# Patient Record
Sex: Male | Born: 1950 | Race: White | Hispanic: No | Smoking: Current every day smoker
Health system: Southern US, Community
[De-identification: ages and names within clinical notes are randomized; demographics above are authoritative.]

## PROBLEM LIST (undated history)

## (undated) DIAGNOSIS — F419 Anxiety disorder, unspecified: Secondary | ICD-10-CM

## (undated) DIAGNOSIS — R229 Localized swelling, mass and lump, unspecified: Secondary | ICD-10-CM

## (undated) DIAGNOSIS — Z8619 Personal history of other infectious and parasitic diseases: Secondary | ICD-10-CM

## (undated) DIAGNOSIS — F1910 Other psychoactive substance abuse, uncomplicated: Secondary | ICD-10-CM

## (undated) DIAGNOSIS — F329 Major depressive disorder, single episode, unspecified: Secondary | ICD-10-CM

## (undated) DIAGNOSIS — K5790 Diverticulosis of intestine, part unspecified, without perforation or abscess without bleeding: Secondary | ICD-10-CM

## (undated) DIAGNOSIS — F1020 Alcohol dependence, uncomplicated: Secondary | ICD-10-CM

## (undated) DIAGNOSIS — M1990 Unspecified osteoarthritis, unspecified site: Secondary | ICD-10-CM

## (undated) DIAGNOSIS — N189 Chronic kidney disease, unspecified: Secondary | ICD-10-CM

## (undated) DIAGNOSIS — C801 Malignant (primary) neoplasm, unspecified: Secondary | ICD-10-CM

## (undated) DIAGNOSIS — F32A Depression, unspecified: Secondary | ICD-10-CM

## (undated) DIAGNOSIS — L578 Other skin changes due to chronic exposure to nonionizing radiation: Secondary | ICD-10-CM

## (undated) DIAGNOSIS — T7840XA Allergy, unspecified, initial encounter: Secondary | ICD-10-CM

## (undated) DIAGNOSIS — F1911 Other psychoactive substance abuse, in remission: Secondary | ICD-10-CM

## (undated) DIAGNOSIS — S2249XA Multiple fractures of ribs, unspecified side, initial encounter for closed fracture: Secondary | ICD-10-CM

## (undated) DIAGNOSIS — F319 Bipolar disorder, unspecified: Secondary | ICD-10-CM

## (undated) DIAGNOSIS — IMO0002 Reserved for concepts with insufficient information to code with codable children: Secondary | ICD-10-CM

## (undated) DIAGNOSIS — D649 Anemia, unspecified: Secondary | ICD-10-CM

## (undated) DIAGNOSIS — I509 Heart failure, unspecified: Secondary | ICD-10-CM

## (undated) DIAGNOSIS — S92309A Fracture of unspecified metatarsal bone(s), unspecified foot, initial encounter for closed fracture: Secondary | ICD-10-CM

## (undated) DIAGNOSIS — F17200 Nicotine dependence, unspecified, uncomplicated: Secondary | ICD-10-CM

## (undated) DIAGNOSIS — E538 Deficiency of other specified B group vitamins: Secondary | ICD-10-CM

## (undated) DIAGNOSIS — M810 Age-related osteoporosis without current pathological fracture: Secondary | ICD-10-CM

## (undated) HISTORY — DX: Deficiency of other specified B group vitamins: E53.8

## (undated) HISTORY — DX: Nicotine dependence, unspecified, uncomplicated: F17.200

## (undated) HISTORY — DX: Chronic kidney disease, unspecified: N18.9

## (undated) HISTORY — DX: Anemia, unspecified: D64.9

## (undated) HISTORY — DX: Depression, unspecified: F32.A

## (undated) HISTORY — DX: Fracture of unspecified metatarsal bone(s), unspecified foot, initial encounter for closed fracture: S92.309A

## (undated) HISTORY — DX: Heart failure, unspecified: I50.9

## (undated) HISTORY — DX: Malignant (primary) neoplasm, unspecified: C80.1

## (undated) HISTORY — DX: Major depressive disorder, single episode, unspecified: F32.9

## (undated) HISTORY — DX: Other psychoactive substance abuse, in remission: F19.11

## (undated) HISTORY — DX: Allergy, unspecified, initial encounter: T78.40XA

## (undated) HISTORY — DX: Alcohol dependence, uncomplicated: F10.20

## (undated) HISTORY — DX: Other skin changes due to chronic exposure to nonionizing radiation: L57.8

## (undated) HISTORY — DX: Other psychoactive substance abuse, uncomplicated: F19.10

## (undated) HISTORY — DX: Personal history of other infectious and parasitic diseases: Z86.19

## (undated) HISTORY — PX: INGUINAL HERNIA REPAIR: SHX194

## (undated) HISTORY — DX: Bipolar disorder, unspecified: F31.9

## (undated) HISTORY — DX: Multiple fractures of ribs, unspecified side, initial encounter for closed fracture: S22.49XA

## (undated) HISTORY — DX: Diverticulosis of intestine, part unspecified, without perforation or abscess without bleeding: K57.90

## (undated) HISTORY — PX: JOINT REPLACEMENT: SHX530

## (undated) HISTORY — DX: Anxiety disorder, unspecified: F41.9

## (undated) HISTORY — DX: Unspecified osteoarthritis, unspecified site: M19.90

## (undated) HISTORY — DX: Age-related osteoporosis without current pathological fracture: M81.0

## (undated) HISTORY — PX: HERNIA REPAIR: SHX51

---
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
## 2007-10-17 ENCOUNTER — Emergency Department (HOSPITAL_COMMUNITY): Admission: EM | Admit: 2007-10-17 | Discharge: 2007-10-17 | Payer: Self-pay | Admitting: Emergency Medicine

## 2010-09-23 ENCOUNTER — Encounter: Payer: Self-pay | Admitting: Family Medicine

## 2010-10-09 DIAGNOSIS — S2249XA Multiple fractures of ribs, unspecified side, initial encounter for closed fracture: Secondary | ICD-10-CM

## 2010-10-09 HISTORY — DX: Multiple fractures of ribs, unspecified side, initial encounter for closed fracture: S22.49XA

## 2011-08-06 ENCOUNTER — Telehealth: Payer: Self-pay | Admitting: Family Medicine

## 2011-08-06 ENCOUNTER — Ambulatory Visit (INDEPENDENT_AMBULATORY_CARE_PROVIDER_SITE_OTHER): Payer: PRIVATE HEALTH INSURANCE | Admitting: Family Medicine

## 2011-08-06 ENCOUNTER — Encounter: Payer: Self-pay | Admitting: Family Medicine

## 2011-08-06 DIAGNOSIS — N529 Male erectile dysfunction, unspecified: Secondary | ICD-10-CM | POA: Insufficient documentation

## 2011-08-06 DIAGNOSIS — Z8659 Personal history of other mental and behavioral disorders: Secondary | ICD-10-CM

## 2011-08-06 DIAGNOSIS — L0390 Cellulitis, unspecified: Secondary | ICD-10-CM

## 2011-08-06 DIAGNOSIS — M1710 Unilateral primary osteoarthritis, unspecified knee: Secondary | ICD-10-CM

## 2011-08-06 DIAGNOSIS — L0291 Cutaneous abscess, unspecified: Secondary | ICD-10-CM

## 2011-08-06 DIAGNOSIS — M1712 Unilateral primary osteoarthritis, left knee: Secondary | ICD-10-CM

## 2011-08-06 DIAGNOSIS — R030 Elevated blood-pressure reading, without diagnosis of hypertension: Secondary | ICD-10-CM | POA: Insufficient documentation

## 2011-08-06 MED ORDER — HYDROCODONE-ACETAMINOPHEN 5-500 MG PO TABS: ORAL_TABLET | ORAL | Status: DC

## 2011-08-06 MED ORDER — TADALAFIL 5 MG PO TABS: 5.0000 mg | ORAL_TABLET | Freq: Every day | ORAL | Status: DC

## 2011-08-06 NOTE — Assessment & Plan Note (Signed)
Trial of cialis--30 day free, 5mg  tabs, 1 qd, no RF. Will obtain old records to see if labs have included a testosterone level.

## 2011-08-06 NOTE — Assessment & Plan Note (Signed)
He has distant hx of traumatic injuries to knee and this could have accelerated his knee cartilage degeneration. Continue ibup 400mg  qd-bid. Vicodin 5/500, 1-2 tabs q6h prn, #40, RF x 1.  Therapeutic expectations and side effect profile of medication discussed today.  Patient's questions answered. Check left knee x-ray and gather old records. Refer to orthopedist for consideration of TKR.

## 2011-08-06 NOTE — Assessment & Plan Note (Signed)
Pt says he is doing "ok" off meds at this time, but I suspect he may just have fears about the meds b/c of past experiences. His outside med rec portion of his meds section of EMR shows he was rx'd bupropion, lamictal, and trazodone in September 2012. We'll discuss this more in depth next visit to make sure he doesn't want trial of alternative med or counseling.

## 2011-08-06 NOTE — Progress Notes (Signed)
Office Note 08/06/2011  CC:  Chief Complaint  Patient presents with  . Establish Care    CPE    HPI:  Shawn [name] is a 60 y.o. White male who is here to establish care. Patient's most recent primary MD: Dr. Christell Constant at [facility]. Old records were not reviewed prior to or during today's visit.  Describes chronic left knee pain and instability x years, by mid-day pain is 5/10 intensity, often feels unstable and like it buckles/gives way. Rarely does it swell up or turn red or get warm to touch.  Worse with wt bearing/walking, stands on hard surface all day. Better with ibuprofen 400mg  qd and sometimes 500mg  tylenol qd.  Says x-rays (most recent was about 2 yrs ago per his report) have documented "no cartilage left".  Describes 1 yr hx of trouble getting stiff erection.  He gets hard enough for penetration but often loses firmness and cannot complete intercourse. ED meds not tried due to cost.  He has been taking 1 Enzyte tab daily and hasn't found it to be much help. Doesn't recall testosterone testing in the past.  Hx of depression, says the meds made him feel "closed in", emotional blunting, so he stopped them.  Says he feels ok from a mood standpoint currently.  Past Medical History  Diagnosis Date  . Depression     says meds made him emotionally blunted.   . Osteoarthritis X years    left knee.  Distant hx (age 22) "dislocated" knee and required surgery, then crush injury to patella required knee cap removal.  . Diverticular disease     "itis" x 2 episodes (last was about 2007)  . Tobacco dependence   . Alcoholism     "Dry" since 2002    Past Surgical History  Procedure Date  . Knee dislocation surgery decades ago  . Patella fracture surgery     Knee cap removed  . Inguinal hernia repair     left    Family History  Problem Relation Age of Onset  . Arthritis Mother   . Arthritis Father   . Cancer Brother     lung cancer.  Died in early 65s.     History   Social History  . Marital Status: Single    Spouse Name: N/A    Number of Children: N/A  . Years of Education: N/A   Occupational History  . Not on file.   Social History Main Topics  . Smoking status: Current Everyday Smoker -- 1.0 packs/day for 30 years    Types: Cigarettes  . Smokeless tobacco: Never Used  . Alcohol Use: No  . Drug Use: No  . Sexually Active: Not on file   Other Topics Concern  . Not on file   Social History Narrative   Married, 1 son and 1 daughter.Lives in [place].Occupation: parts Camera operator.Tobacco 30 pack-yr hx.  No alcohol currently: quit 10 yrs ago, +alcoholic.No drug use.No exercise.    Current meds: MVI qd, Enzyte qd, ibup 400mg  qd, tyl 500mg  qd Outpatient Encounter Prescriptions as of 08/06/2011  Medication Sig Dispense Refill  . acetaminophen (TYLENOL) 500 MG tablet Take 1,000 mg by mouth every 6 (six) hours as needed.        Marland Kitchen HYDROcodone-acetaminophen (VICODIN) 5-500 MG per tablet 1-2 tabs po q6h prn pain  40 tablet  1  . tadalafil (CIALIS) 5 MG tablet Take 1 tablet (5 mg total) by mouth daily.  30 tablet  0  Allergies  Allergen Reactions  . Codeine Nausea Only  . Diclofenac Rash    Diclofenac tabs    ROS Review of Systems  Constitutional: Negative for fever, appetite change and fatigue.  HENT: Negative for congestion and sore throat.   Eyes: Negative for visual disturbance.  Respiratory: Negative for cough, chest tightness and shortness of breath.   Cardiovascular: Negative for chest pain.  Gastrointestinal: Negative for nausea and abdominal pain.  Genitourinary: Negative for dysuria.  Musculoskeletal: Positive for joint swelling (occ left knee). Negative for back pain.  Skin: Negative for rash.  Neurological: Negative for dizziness, tremors, seizures, weakness, light-headedness and headaches.  Hematological: Negative for adenopathy.    PE; Blood pressure 180/97,  pulse 67, height 6' 1.5" (1.867 m), weight 180 lb (81.647 kg), SpO2 96.00%. BP recheck 166/100 in both left and right arm. Gen: Alert, well appearing.  Patient is oriented to person, place, time, and situation. ENT:   Eyes: no injection, icteris, swelling, or exudate.  EOMI, PERRLA. Nose: no drainage or turbinate edema/swelling.  No injection or focal lesion.  Mouth: lips without lesion/swelling.  Oral mucosa pink and moist.  Oropharynx without erythema, exudate, or swelling.  Neck - No masses or thyromegaly or limitation in range of motion CV: RRR, no m/r/g.   LUNGS: CTA bilat, nonlabored resps, good aeration in all lung fields.  Pertinent labs:  none  ASSESSMENT AND PLAN:   New Patient: obtain old records.  Osteoarthritis of left knee He has distant hx of traumatic injuries to knee and this could have accelerated his knee cartilage degeneration. Continue ibup 400mg  qd-bid. Vicodin 5/500, 1-2 tabs q6h prn, #40, RF x 1.  Therapeutic expectations and side effect profile of medication discussed today.  Patient's questions answered. Check left knee x-ray and gather old records. Refer to orthopedist for consideration of TKR.  Erectile dysfunction Trial of cialis--30 day free, 5mg  tabs, 1 qd, no RF. Will obtain old records to see if labs have included a testosterone level.   Elevated blood pressure reading without diagnosis of hypertension No prior hx of this per pt. He'll monitor with his wife's home bp cuff daily and bring these to next f/u visit in 37mo. Call before then if bp consistently >160 systolic or >100 diastolic.  History of depression Pt says he is doing "ok" off meds at this time, but I suspect he may just have fears about the meds b/c of past experiences. His outside med rec portion of his meds section of EMR shows he was rx'd bupropion, lamictal, and trazodone in September 2012. We'll discuss this more in depth next visit to make sure he doesn't want trial of alternative  med or counseling.     Return in about 1 month (around 09/06/2011) for f/u ED, tobacco dependence, depression.

## 2011-08-06 NOTE — Telephone Encounter (Signed)
Pls request records from [facility] (Dr. Christell Constant)  Thx.

## 2011-08-06 NOTE — Assessment & Plan Note (Signed)
No prior hx of this per pt. He'll monitor with his wife's home bp cuff daily and bring these to next f/u visit in 45mo. Call before then if bp consistently >160 systolic or >100 diastolic.

## 2011-08-13 ENCOUNTER — Ambulatory Visit (INDEPENDENT_AMBULATORY_CARE_PROVIDER_SITE_OTHER)
Admission: RE | Admit: 2011-08-13 | Discharge: 2011-08-13 | Disposition: A | Discharge status: Home / Self Care | Payer: PRIVATE HEALTH INSURANCE | Source: Ambulatory Visit | Attending: Family Medicine | Admitting: Family Medicine

## 2011-08-13 DIAGNOSIS — M1712 Unilateral primary osteoarthritis, left knee: Secondary | ICD-10-CM

## 2011-08-13 DIAGNOSIS — M1710 Unilateral primary osteoarthritis, unspecified knee: Secondary | ICD-10-CM

## 2011-09-04 ENCOUNTER — Encounter: Payer: Self-pay | Admitting: *Deleted

## 2011-09-04 ENCOUNTER — Encounter: Payer: Self-pay | Admitting: Family Medicine

## 2011-09-04 ENCOUNTER — Ambulatory Visit (INDEPENDENT_AMBULATORY_CARE_PROVIDER_SITE_OTHER): Payer: PRIVATE HEALTH INSURANCE | Admitting: Family Medicine

## 2011-09-04 VITALS — BP 116/80 | HR 83 | Temp 98.1°F | Ht 73.5 in | Wt 176.0 lb

## 2011-09-04 DIAGNOSIS — K529 Noninfective gastroenteritis and colitis, unspecified: Secondary | ICD-10-CM

## 2011-09-04 DIAGNOSIS — K5289 Other specified noninfective gastroenteritis and colitis: Secondary | ICD-10-CM

## 2011-09-04 DIAGNOSIS — M1712 Unilateral primary osteoarthritis, left knee: Secondary | ICD-10-CM

## 2011-09-04 DIAGNOSIS — IMO0002 Reserved for concepts with insufficient information to code with codable children: Secondary | ICD-10-CM

## 2011-09-04 DIAGNOSIS — E860 Dehydration: Secondary | ICD-10-CM

## 2011-09-04 DIAGNOSIS — R109 Unspecified abdominal pain: Secondary | ICD-10-CM

## 2011-09-04 DIAGNOSIS — M1710 Unilateral primary osteoarthritis, unspecified knee: Secondary | ICD-10-CM

## 2011-09-04 LAB — CBC WITH DIFFERENTIAL/PLATELET
Basophils Relative: 0.3 % (ref 0.0–3.0)
Eosinophils Absolute: 0.3 10*3/uL (ref 0.0–0.7)
Hemoglobin: 15 g/dL (ref 13.0–17.0)
Lymphs Abs: 1 10*3/uL (ref 0.7–4.0)
MCHC: 34.8 g/dL (ref 30.0–36.0)
MCV: 93.6 fl (ref 78.0–100.0)
Monocytes Absolute: 0.8 10*3/uL (ref 0.1–1.0)
Neutro Abs: 5.5 10*3/uL (ref 1.4–7.7)
Neutrophils Relative %: 72.7 % (ref 43.0–77.0)
RBC: 4.62 Mil/uL (ref 4.22–5.81)

## 2011-09-04 LAB — COMPREHENSIVE METABOLIC PANEL
AST: 19 U/L (ref 0–37)
Albumin: 3.6 g/dL (ref 3.5–5.2)
Alkaline Phosphatase: 118 U/L — ABNORMAL HIGH (ref 39–117)
Potassium: 3.6 mEq/L (ref 3.5–5.3)
Sodium: 138 mEq/L (ref 135–145)
Total Protein: 6.3 g/dL (ref 6.0–8.3)

## 2011-09-04 LAB — LIPASE: Lipase: <10 U/L (ref 0–75)

## 2011-09-04 MED ORDER — HYDROCODONE-ACETAMINOPHEN 5-500 MG PO TABS: ORAL_TABLET | ORAL | Status: DC

## 2011-09-04 MED ORDER — PROMETHAZINE HCL 25 MG/ML IJ SOLN
12.5000 mg | Freq: Once | INTRAMUSCULAR | Status: AC
  Administered 2011-09-04: 12.5 mg via INTRAMUSCULAR

## 2011-09-04 MED ORDER — PROMETHAZINE HCL 12.5 MG PO TABS: ORAL_TABLET | ORAL | Status: DC

## 2011-09-04 MED ORDER — PROMETHAZINE HCL 25 MG RE SUPP: RECTAL | Status: DC

## 2011-09-04 NOTE — Progress Notes (Signed)
OFFICE NOTE  09/04/2011  CC:  Chief Complaint  Patient presents with  . URI    flu like symptoms     HPI: Patient is a 61 y.o. Caucasian male who is here for nausea, vomiting, and diarrhea.  Here with wife today, who helped with history. Pt reports onset of symptoms 5 days ago--including prominent full body aches.  Started one night after eating at a local seafood restaurant, ate what he has eaten there many times before, knows of no one else sick that ate there.  T 103, vomiting peristently for about 2d, now this has abated but nausea/no appetite continue.  Fevers have abated.  Had 5-7 watery BMs per 24h for a few days, now letting up but still having loose BMs.  No blood or pus noted in stool.  Epigastric pain severe initially, now just sore, mostly central epigastric area.  Drinking liquids fair---mostly gatorade.  Gets dizzy when he sits up and tries to walk around, wants to just lay in bed.   Denies ST, URI, cough, rash, or HA.  No antibiotics in months.  ROS:  no rash, no neck stiffness, no shortness of breath, no chest pain.  HIs wife states he has lost about 20 lbs during this illness. He got influenza vaccine 2 mo ago.   Pertinent PMH:  Past Medical History  Diagnosis Date  . Depression     says meds made him emotionally blunted.   . Osteoarthritis X years    left knee.  Distant hx (age 58) "dislocated" knee and required surgery, then crush injury to patella required knee cap removal.  . Diverticular disease     "itis" x 2 episodes (last was about 2007)  . Tobacco dependence   . Alcoholism     "Dry" since 2002  Of note, he has been referred to local orthopedist but has not done this yet--says he plans to.  Past Surgical History  Procedure Date  . Knee dislocation surgery decades ago  . Patella fracture surgery     Knee cap removed  . Inguinal hernia repair     left   Past family and social history reviewed and there are no changes since the patient's last office  visit with me.  Pertinent Meds:  OTC cold med, tylenol prn ,cialis prn  PE: Blood pressure 116/80, pulse 83, temperature 98.1 F (36.7 C), temperature source Temporal, height 6' 1.5" (1.867 m), weight 176 lb (79.833 kg), SpO2 96.00%. GEN: appears weak, tired, moves slowly/cautiously.  Appears nontoxic.  He's alert, oriented x 4. ENT: Ears: EACs clear, normal epithelium.  TMs with good light reflex and landmarks bilaterally.  Eyes: no injection, icteris, swelling, or exudate.  EOMI, PERRLA. Nose: no drainage or turbinate edema/swelling.  No injection or focal lesion.  Mouth: lips without lesion/swelling.  Oral mucosa pink and moist.  Dentition intact and without obvious caries or gingival swelling.  Oropharynx without erythema, exudate, or swelling.  Neck - No masses or thyromegaly or limitation in range of motion CV: RRR, no m/r/g.   LUNGS: CTA bilat, nonlabored resps, good aeration in all lung fields. ABD: soft, mild mid epigastric TTP, without guarding or rebound.  No mass or HSM.  BS hypoactive. EXT: no clubbing, cyanosis, or edema.  Skin - no sores or suspicious lesions or rashes or color changes   IMPRESSION AND PLAN: Severe gastroenteritis, dehydration, ongoing sx's--although mildly improving gradually.  Likely viral etiology. Plan is to give 12.5mg  IM phenergan in office today, sent rx's  for phenergan pills and suppositories to his pharmacy, encouraged slow/persistent clear liquid intake, slow advancement of diet.  Will also check CBC, CMET, Lipase, H. Pylori ab, and routine stool studies. Vicodin 5/500, 1-2 tabs po q6h prn hopefully to help with aches and with diearrhea a bit.  FOLLOW UP: 2d

## 2011-09-05 ENCOUNTER — Ambulatory Visit: Payer: PRIVATE HEALTH INSURANCE | Admitting: Family Medicine

## 2011-09-06 ENCOUNTER — Ambulatory Visit (INDEPENDENT_AMBULATORY_CARE_PROVIDER_SITE_OTHER): Payer: PRIVATE HEALTH INSURANCE | Admitting: Family Medicine

## 2011-09-06 ENCOUNTER — Encounter: Payer: Self-pay | Admitting: Family Medicine

## 2011-09-06 DIAGNOSIS — M1712 Unilateral primary osteoarthritis, left knee: Secondary | ICD-10-CM

## 2011-09-06 DIAGNOSIS — F17200 Nicotine dependence, unspecified, uncomplicated: Secondary | ICD-10-CM

## 2011-09-06 DIAGNOSIS — K529 Noninfective gastroenteritis and colitis, unspecified: Secondary | ICD-10-CM

## 2011-09-06 DIAGNOSIS — M1710 Unilateral primary osteoarthritis, unspecified knee: Secondary | ICD-10-CM

## 2011-09-06 DIAGNOSIS — R030 Elevated blood-pressure reading, without diagnosis of hypertension: Secondary | ICD-10-CM

## 2011-09-06 DIAGNOSIS — K5289 Other specified noninfective gastroenteritis and colitis: Secondary | ICD-10-CM

## 2011-09-06 MED ORDER — BUPROPION HCL ER (SR) 150 MG PO TB12: 150.0000 mg | ORAL_TABLET | Freq: Two times a day (BID) | ORAL | Status: DC

## 2011-09-06 NOTE — Progress Notes (Signed)
OFFICE NOTE  09/08/2011  CC:  Chief Complaint  Patient presents with  . Follow-up    tobacco, depression, ED     HPI:   Patient is a 61 y.o. Caucasian male who is here for f/u tob dep, hx of depression, ED, osteoarthritis, plus had severe GE 2d/a. Feels 75-80% improved regarding GE sx's.  Advancing diet a litte, taking fluids well.  Not requiring phenergan today. Still with 3-4 loose BM's per day.  We reviewed results of stools studies avail so far: giardia, crypto, rotavirus all negative.  Discussed tobacco dependence: has smoked 1 pack/day of full flavored filtered cig's for 30+ yrs, is at a point where he wants to try to quit and is interested in med assistance.  He's familiar with nicotine replacement from prior attempts to quit. He is not in favor of chantix.  He has a cigarette less than 10 min after waking up in the morning.  If he wakes up in the night he will often have one.  Left knee still painful, recent x-ray confirmed severe DJD.  He took the vicodin I gave him but it took him a while and he did not request refill.  I gave him some the other day when he was here for GE illness.  He has been referred to orthopedist for further e/m but due to scheduling conflicts he has had to put this off some, plans on calling them back soon.  Pertinent PMH:  Past Medical History  Diagnosis Date  . Depression     says meds made him emotionally blunted.   . Osteoarthritis X years    left knee.  Distant hx (age 87) "dislocated" knee and required surgery, then crush injury to patella required knee cap removal.  . Diverticular disease     "itis" x 2 episodes (last was about 2007)  . Tobacco dependence   . Alcoholism     "Dry" since 2002   Past Surgical History  Procedure Date  . Knee dislocation surgery decades ago  . Patella fracture surgery     Knee cap removed  . Inguinal hernia repair     left   Past family and social history reviewed and there are no changes since the  patient's last office visit with me.  MEDS;   Outpatient Prescriptions Prior to Visit  Medication Sig Dispense Refill  . acetaminophen (TYLENOL) 500 MG tablet Take 1,000 mg by mouth every 6 (six) hours as needed.        Marland Kitchen HYDROcodone-acetaminophen (VICODIN) 5-500 MG per tablet 1-2 tabs po q6h prn pain  40 tablet  1  . promethazine (PHENERGAN) 12.5 MG tablet 1-2 tabs po q6h prn nausea  30 tablet  0  . promethazine (PHENERGAN) 25 MG suppository 1/2-1 suppository PR q6h prn nausea if unable to tolerate phenergan tabs  12 each  1  . tadalafil (CIALIS) 5 MG tablet Take 1 tablet (5 mg total) by mouth daily.  30 tablet  0    PE: Blood pressure 121/77, pulse 88, height 6' 1.5" (1.867 m), weight 173 lb (78.472 kg). Gen: Alert, well appearing.  Patient is oriented to person, place, time, and situation. ENT: Eyes: no injection, icteris, swelling, or exudate.  EOMI, PERRLA. Nose: no drainage or turbinate edema/swelling.  No injection or focal lesion.  Mouth: lips without lesion/swelling.  Oral mucosa pink and moist.  Oropharynx without erythema, exudate, or swelling.  Neck - No masses or thyromegaly or limitation in range of motion CV: RRR, no  m/r/g.   LUNGS: CTA bilat, nonlabored resps, good aeration in all lung fields. ABD: soft, NT, ND, BS normal.  No hepatospenomegaly or mass.  No bruits. EXT: no clubbing, cyanosis, or edema.   Lab Results  Component Value Date   WBC 7.5 09/04/2011   HGB 15.0 09/04/2011   HCT 43.2 09/04/2011   MCV 93.6 09/04/2011   PLT 315.0 09/04/2011     Chemistry      Component Value Date/Time   NA 138 09/04/2011 1130   K 3.6 09/04/2011 1130   CL 100 09/04/2011 1130   CO2 25 09/04/2011 1130   BUN 10 09/04/2011 1130   CREATININE 0.76 09/04/2011 1130      Component Value Date/Time   CALCIUM 9.0 09/04/2011 1130   ALKPHOS 118* 09/04/2011 1130   AST 19 09/04/2011 1130   ALT 23 09/04/2011 1130   BILITOT 0.7 09/04/2011 1130     H. Pylori IgG negative 09/04/11  IMPRESSION AND PLAN:  Elevated  blood pressure reading without diagnosis of hypertension Resolved.  Continue to monitor at home periodically.  Tobacco dependence Ready to attempt cessation. He picked his b-day, feb 5, as his quit date. Discussed medication assistance options in detail and decided on bupropion SR 150mg , 1 bid.  Also, he'll do high dose nicotine patch qd x 44mo and then step down to medium dose.  Therapeutic expectations and side effect profile of medication discussed today.  Patient's questions answered.   Gastroenteritis Resolving. Stool studies neg so far (bact clx and c dif pcr still pending), blood labs normal.  Osteoarthritis of left knee Severe.  He has pain meds but rarely uses it because he's stoic, not because he doesn't need it.   He has to rearrange his initial consult visit with orthopedist that we made for him.     FOLLOW UP:  Return for 1-2 mo f/u for fasting CPE and smoking cessation f/u. Will get lipids, TSH, and testosterone level, and PSA at that time.

## 2011-09-08 DIAGNOSIS — K529 Noninfective gastroenteritis and colitis, unspecified: Secondary | ICD-10-CM | POA: Insufficient documentation

## 2011-09-08 DIAGNOSIS — F17200 Nicotine dependence, unspecified, uncomplicated: Secondary | ICD-10-CM | POA: Insufficient documentation

## 2011-09-08 NOTE — Assessment & Plan Note (Signed)
Resolved.  Continue to monitor at home periodically.

## 2011-09-08 NOTE — Assessment & Plan Note (Signed)
Ready to attempt cessation. He picked his b-day, feb 5, as his quit date. Discussed medication assistance options in detail and decided on bupropion SR 150mg , 1 bid.  Also, he'll do high dose nicotine patch qd x 67mo and then step down to medium dose.  Therapeutic expectations and side effect profile of medication discussed today.  Patient's questions answered.

## 2011-09-08 NOTE — Assessment & Plan Note (Signed)
Severe.  He has pain meds but rarely uses it because he's stoic, not because he doesn't need it.   He has to rearrange his initial consult visit with orthopedist that we made for him.

## 2011-09-08 NOTE — Assessment & Plan Note (Signed)
Resolving. Stool studies neg so far (bact clx and c dif pcr still pending), blood labs normal.

## 2011-09-09 LAB — CLOSTRIDIUM DIFFICILE BY PCR: Toxigenic C. Difficile by PCR: NOT DETECTED

## 2011-09-09 LAB — STOOL CULTURE

## 2011-09-16 ENCOUNTER — Telehealth: Payer: Self-pay | Admitting: Family Medicine

## 2011-09-16 NOTE — Telephone Encounter (Signed)
Pt states knee went out on Friday.  He has been alternating ice/heat over the weekend.  States his knee is swollen, red and hot to touch.  Swelling is not as severe as it has been since he is using ice.  Pt is taking 2 vicodin every 6 hours and diclofenac-one tab twice daily with food.  He states these meds are not touching his pain.  He has appt tomorrow morning at 10:15 with ortho.  Please advise recommendation and if you are OK with patient having stronger med.

## 2011-09-16 NOTE — Telephone Encounter (Signed)
Cannot give any stronger medications over the phone, may apply Aspercreme and ice as much as needed overnight

## 2011-09-16 NOTE — Telephone Encounter (Signed)
Pt.notified

## 2011-09-18 ENCOUNTER — Telehealth: Payer: Self-pay | Admitting: Family Medicine

## 2011-09-18 NOTE — Telephone Encounter (Signed)
Patient would like refill

## 2011-09-19 ENCOUNTER — Encounter: Payer: Self-pay | Admitting: Family Medicine

## 2011-09-19 DIAGNOSIS — Z125 Encounter for screening for malignant neoplasm of prostate: Secondary | ICD-10-CM | POA: Insufficient documentation

## 2011-09-19 MED ORDER — TADALAFIL 5 MG PO TABS: 5.0000 mg | ORAL_TABLET | Freq: Every day | ORAL | Status: DC

## 2011-09-19 NOTE — Telephone Encounter (Signed)
Pt will need follow up in 1-2 months.  RX sent to last until then.

## 2011-10-11 ENCOUNTER — Telehealth: Payer: Self-pay | Admitting: *Deleted

## 2011-10-11 NOTE — Telephone Encounter (Signed)
Noted-PM 

## 2011-10-11 NOTE — Telephone Encounter (Signed)
Pt wanted to let you know that he will be having a left total knee replacement on 11/20/11 with Dr. Eulah Pont.

## 2011-10-18 ENCOUNTER — Encounter: Payer: Self-pay | Admitting: Family Medicine

## 2011-10-18 ENCOUNTER — Ambulatory Visit (INDEPENDENT_AMBULATORY_CARE_PROVIDER_SITE_OTHER): Payer: PRIVATE HEALTH INSURANCE | Admitting: Family Medicine

## 2011-10-18 DIAGNOSIS — M1710 Unilateral primary osteoarthritis, unspecified knee: Secondary | ICD-10-CM

## 2011-10-18 DIAGNOSIS — R0902 Hypoxemia: Secondary | ICD-10-CM

## 2011-10-18 DIAGNOSIS — M1712 Unilateral primary osteoarthritis, left knee: Secondary | ICD-10-CM

## 2011-10-18 DIAGNOSIS — Z0000 Encounter for general adult medical examination without abnormal findings: Secondary | ICD-10-CM

## 2011-10-18 LAB — LIPID PANEL
Cholesterol: 170 mg/dL (ref 0–200)
LDL Cholesterol: 100 mg/dL — ABNORMAL HIGH (ref 0–99)
Triglycerides: 67 mg/dL (ref 0.0–149.0)

## 2011-10-18 LAB — TESTOSTERONE: Testosterone: 465.6 ng/dL (ref 350.00–890.00)

## 2011-10-18 NOTE — Progress Notes (Signed)
Office Note 10/24/2011  CC:  Chief Complaint  Patient presents with  . Annual Exam    CPE, discuss smoking cessation, pt did not start Wellbutrin    HPI:  Shawn [name] is a 61 y.o. White male who is here for CPE. I last saw him about 1 mo ago and I rx'd bupropion to assist in tobacco cessation but he did not start this med and did not attempt smoking cessation.  He is scheduled for left knee surgery 11/20/11 and he plans to attempt quitting before that surgery.  Diclofenac has helped his knee pain/stiffness. He completely denies feeling any SOB, dyspnea with exertion, cough, chest pain, chest tightness or any type of resp sx's at baseline and currently.  Past Medical History  Diagnosis Date  . Bipolar disorder     Has had multiple psych hospitalization in the past, most recently at [facility] 01/31/11-02/04/11.  Says meds made him emotionally blunted. (lamictal and wellbutrin).  . Osteoarthritis X years    left knee.  Distant hx (age 49) "dislocated" knee and required surgery, then crush injury to patella required knee cap removal.  . Diverticular disease     "itis" x 2 episodes (last was about 2007)  . Tobacco dependence   . Alcoholism     "Dry" since 2002  . Multiple rib fractures 10/09/10    s/p fall down a ravine--9th and 10th on right, contusion of left 7th and 8th ribs  . Fracture of metatarsal bone(s), closed     3rd, 4th, 5th mid shaft on left foot  . History of substance abuse     cocaine, marijuana, acid, alcohol (in remission since 2010)  . Solar dermatitis     face and ears    Past Surgical History  Procedure Date  . Knee dislocation surgery 1966  . Patellectomy 1968    s/p crush injury  . Inguinal hernia repair     left    Family History  Problem Relation Age of Onset  . Arthritis Mother   . Arthritis Father   . Cancer Brother     lung cancer.  Died in early 43s.    History   Social History  . Marital Status: Married    Spouse Name:  N/A    Number of Children: N/A  . Years of Education: N/A   Occupational History  . Not on file.   Social History Main Topics  . Smoking status: Current Everyday Smoker -- 1.0 packs/day for 30 years    Types: Cigarettes  . Smokeless tobacco: Never Used  . Alcohol Use: No  . Drug Use: No  . Sexually Active: Not on file   Other Topics Concern  . Not on file   Social History Narrative   Married, 1 son and 1 daughter.Lives in [place].Occupation: parts Camera operator.Tobacco 30 pack-yr hx.  No alcohol currently: quit 10 yrs ago, +alcoholic.No drug use.No exercise.     Outpatient Prescriptions Prior to Visit  Medication Sig Dispense Refill  . HYDROcodone-acetaminophen (VICODIN) 5-500 MG per tablet 1-2 tabs po q6h prn pain  40 tablet  1  . tadalafil (CIALIS) 5 MG tablet Take 1 tablet (5 mg total) by mouth daily.  30 tablet  1  . acetaminophen (TYLENOL) 500 MG tablet Take 1,000 mg by mouth every 6 (six) hours as needed.        Marland Kitchen buPROPion (WELLBUTRIN SR) 150 MG 12 hr tablet Take 1 tablet (150 mg total) by mouth 2 (  two) times daily.  60 tablet  3  . promethazine (PHENERGAN) 12.5 MG tablet 1-2 tabs po q6h prn nausea  30 tablet  0  . promethazine (PHENERGAN) 25 MG suppository 1/2-1 suppository PR q6h prn nausea if unable to tolerate phenergan tabs  12 each  1    Allergies  Allergen Reactions  . Codeine Nausea Only  . Diclofenac Rash    Diclofenac tabs    ROS Review of Systems  Constitutional: Negative for fever, chills, appetite change and fatigue.  HENT: Negative for ear pain, congestion, sore throat, neck stiffness and dental problem.   Eyes: Negative for discharge, redness and visual disturbance.  Respiratory: Negative for cough, chest tightness, shortness of breath and wheezing.   Cardiovascular: Negative for chest pain, palpitations and leg swelling.  Gastrointestinal: Negative for nausea, vomiting, abdominal pain, diarrhea and blood  in stool.  Genitourinary: Negative for dysuria, urgency, frequency, hematuria, flank pain and difficulty urinating.  Musculoskeletal: Positive for joint swelling (left knee) and arthralgias (left knee). Negative for myalgias and back pain.  Skin: Negative for pallor and rash.  Neurological: Negative for dizziness, speech difficulty, weakness and headaches.  Hematological: Negative for adenopathy. Does not bruise/bleed easily.  Psychiatric/Behavioral: Negative for confusion and sleep disturbance. The patient is not nervous/anxious.     PE; Blood pressure 141/85, pulse 69, temperature 98.1 F (36.7 C), temperature source Temporal, height 6' 1.5" (1.867 m), weight 178 lb (80.74 kg), SpO2 89.00%. Gen: Alert, well appearing.  Patient is oriented to person, place, time, and situation. ENT: Ears: EACs clear, normal epithelium.  TMs with good light reflex and landmarks bilaterally.  Eyes: no injection, icteris, swelling, or exudate.  EOMI, PERRLA. Nose: no drainage or turbinate edema/swelling.  No injection or focal lesion.  Mouth: lips without lesion/swelling.  Oral mucosa pink and moist.  Dentition intact and without obvious caries or gingival swelling.  Oropharynx without erythema, exudate, or swelling.  Neck - No masses or thyromegaly or limitation in range of motion.  Carotids 2+, no bruits. CV: RRR, no m/r/g.   LUNGS: CTA bilat, nonlabored resps, good aeration in all lung fields. ABD: soft, NT, ND, BS normal.  No hepatospenomegaly or mass.  No bruits. EXT: no clubbing, cyanosis, or edema.  Genitals normal; both testes normal without tenderness, masses, hydroceles, varicoceles, erythema or swelling. Shaft normal, circumcised, meatus normal without discharge. No inguinal hernia noted. No inguinal lymphadenopathy. Rectal exam: negative without mass, lesions or tenderness.  Prostate symmetric, smooth, without nodule or tenderness.  Pertinent labs:  None today  ASSESSMENT AND PLAN:   Health  maintenance examination Reviewed age and gender appropriate health maintenance issues (prudent diet, regular exercise, health risks of tobacco and excessive alcohol, use of seatbelts, fire alarms in home, use of sunscreen).  Also reviewed age and gender appropriate health screening as well as vaccine recommendations. Fasting labs ordered for later today (including PSA).  Hypoxia Initial measurement was with our pulse oximeter on our automatic bp cuff in the triage area.   Repeat pulse ox with portable machine at rest and WHILE AMBULATING 3 laps around our office area showed O2 sat 97% on RA.  Additionally, past pulse ox measurements here on 08/06/11 and 09/04/11 were 96%. I believe our initial measurement was a mistake/inaccurate.  Osteoarthritis of left knee He is scheduled for left TKR soon.     FOLLOW UP:  Return for lab appt today (orders entered). F/u o/v 95mo.

## 2011-10-24 DIAGNOSIS — Z0000 Encounter for general adult medical examination without abnormal findings: Secondary | ICD-10-CM | POA: Insufficient documentation

## 2011-10-24 DIAGNOSIS — R0902 Hypoxemia: Secondary | ICD-10-CM | POA: Insufficient documentation

## 2011-10-24 NOTE — Assessment & Plan Note (Addendum)
Initial measurement was with our pulse oximeter on our automatic bp cuff in the triage area.   Repeat pulse ox with portable machine at rest and WHILE AMBULATING 3 laps around our office area showed O2 sat 97% on RA.  Additionally, past pulse ox measurements here on 08/06/11 and 09/04/11 were 96%. I believe our initial measurement was a mistake/inaccurate.

## 2011-10-24 NOTE — Assessment & Plan Note (Signed)
He is scheduled for left TKR soon.

## 2011-10-24 NOTE — Assessment & Plan Note (Signed)
Reviewed age and gender appropriate health maintenance issues (prudent diet, regular exercise, health risks of tobacco and excessive alcohol, use of seatbelts, fire alarms in home, use of sunscreen).  Also reviewed age and gender appropriate health screening as well as vaccine recommendations. Fasting labs ordered for later today (including PSA).

## 2011-11-06 ENCOUNTER — Encounter (HOSPITAL_COMMUNITY): Payer: Self-pay | Admitting: Pharmacy Technician

## 2011-11-13 ENCOUNTER — Encounter (HOSPITAL_COMMUNITY): Payer: Self-pay

## 2011-11-13 ENCOUNTER — Other Ambulatory Visit: Payer: Self-pay

## 2011-11-13 ENCOUNTER — Encounter (HOSPITAL_COMMUNITY)
Admission: RE | Admit: 2011-11-13 | Discharge: 2011-11-13 | Disposition: A | Discharge status: Home / Self Care | Payer: PRIVATE HEALTH INSURANCE | Source: Ambulatory Visit | Attending: Orthopedic Surgery | Admitting: Orthopedic Surgery

## 2011-11-13 LAB — SURGICAL PCR SCREEN: Staphylococcus aureus: POSITIVE — AB

## 2011-11-13 LAB — URINALYSIS, ROUTINE W REFLEX MICROSCOPIC
Bilirubin Urine: NEGATIVE
Hgb urine dipstick: NEGATIVE
Protein, ur: NEGATIVE mg/dL
Specific Gravity, Urine: 1.013 (ref 1.005–1.030)
Urobilinogen, UA: 0.2 mg/dL (ref 0.0–1.0)

## 2011-11-13 LAB — COMPREHENSIVE METABOLIC PANEL
ALT: 18 U/L (ref 0–53)
AST: 19 U/L (ref 0–37)
CO2: 26 mEq/L (ref 19–32)
Chloride: 104 mEq/L (ref 96–112)
GFR calc non Af Amer: >90 mL/min (ref >90)
Sodium: 139 mEq/L (ref 135–145)
Total Bilirubin: 0.4 mg/dL (ref 0.3–1.2)

## 2011-11-13 LAB — CBC
HCT: 43.3 % (ref 39.0–52.0)
MCV: 89.8 fL (ref 78.0–100.0)
RBC: 4.82 MIL/uL (ref 4.22–5.81)
WBC: 5.8 10*3/uL (ref 4.0–10.5)

## 2011-11-13 LAB — APTT: aPTT: 28 seconds (ref 24–37)

## 2011-11-13 MED ORDER — CHLORHEXIDINE GLUCONATE 4 % EX LIQD: 60.0000 mL | Freq: Once | CUTANEOUS | Status: DC

## 2011-11-13 NOTE — Pre-Procedure Instructions (Signed)
20 Shawn [name]  11/13/2011   Your procedure is scheduled on:  *Wednesday, March 20**  Report to [facility] at 0930 AM.  Call this number if you have problems the morning of surgery: [phone]   Remember:   Do not eat food:After Midnight.  May have clear liquids: up to 4 Hours before arrival.(0530 am)  Clear liquids include soda, tea, black coffee, apple or grape juice, broth.  Take these medicines the morning of surgery with A SIP OF WATER: Wellbutrin, Hydrocodone ( if needed)   Do not wear jewelry, make-up or nail polish.  Do not wear lotions, powders, or perfumes. You may wear deodorant.  Do not shave 48 hours prior to surgery.  Do not bring valuables to the hospital.  Contacts, dentures or bridgework may not be worn into surgery.  Leave suitcase in the car. After surgery it may be brought to your room.  For patients admitted to the hospital, checkout time is 11:00 AM the day of discharge.   Patients discharged the day of surgery will not be allowed to drive home.     Special Instructions: Incentive Spirometry - Practice and bring it with you on the day of surgery. and CHG Shower Use Special Wash: 1/2 bottle night before surgery and 1/2 bottle morning of surgery.   Please read over the following fact sheets that you were given: Pain Booklet, Coughing and Deep Breathing, Blood Transfusion Information, Total Joint Packet, MRSA Information and Surgical Site Infection Prevention

## 2011-11-13 NOTE — Progress Notes (Signed)
NO ORDERS RECEIVED AT THIS TIME.

## 2011-11-13 NOTE — Progress Notes (Signed)
LEFT MESSAGE ON HOME PHONE [phone]  .NASAL CX POS FOR SA.

## 2011-11-14 NOTE — Consult Note (Signed)
Anesthesia:  Patient is a 61 year old male scheduled for a left total knee on 11/20/11.  History includes Bipolar disorder, alcoholism with a temporary relapse in June of 2012 at [facility], tobacco dependence, polysubstance abuse in remission since 2010, OA, multiple rib fractures s/p fall 10/2010, diverticular disease, solar dermatitis.  His PCP is Dr. Milinda Cave who is aware of planned procedure.    Labs acceptable.  CXR showed no acute findings.  EKG shows SB at 55 @ bpm, right BBB.  Currently there are no comparison EKGs available.  No CV symptoms reported at his PAT appointment and no specific pre-operative testing recommended at his last PCP visit.  If remains asymptomatic, then anticipate he can proceed.

## 2011-11-19 MED ORDER — CEFAZOLIN SODIUM-DEXTROSE 2-3 GM-% IV SOLR
2.0000 g | INTRAVENOUS | Status: AC
  Administered 2011-11-20: 2 g via INTRAVENOUS
  Filled 2011-11-19: qty 50

## 2011-11-19 NOTE — H&P (Signed)
  [facility] [address]   [address] [place], [place] [address] [phone] A Division of [facility]  Loreta Ave, M.D.     Robert A. Thurston Hole, M.D.     Lunette Stands, M.D. Eulas Post, M.D.    Buford Dresser, M.D. Estell Harpin, M.D. Ralene Cork, D.O.          Genene Churn. Barry Dienes, PA-C            Kirstin A. Shepperson, PA-C Janace Litten, OPA-C   RE: [place], [place]   [id number]      DOB: Apr 20, 1951 PROGRESS NOTE: 11-05-11 Chief complaint: left knee pain. History of present illness: 61 year old white male history of end stage degenerative joint disease left knee and chronic pain returns. states knee symptoms unchanged from previous visit. He's wanting to proceed with total knee replacement as scheduled. Current medications: Diclofenac naproxen sodium and Hydrocodone. Drug allergies codeine causes GI upset.  Past medical/surgical history: left knee surgery x3 with patellectomy 6/92.  Family history positive heart disease cancer. Social history patient is married admits smoking one pack per day x30 years. Denies alcohol consumption. Review of systems: no fever chills cardiac pulmonary GI GU issues.    EXAMINATION: Height 6' " weight 179 pounds temp 97.6 pulse 63 blood pressure 168/98 right arm and left arm 147/91. Alert and oriented x3 in no acute distress. Head is normal cephalic atraumatic. PERRLA and EOMI. Neck unremarkable. Lungs CTA bilaterally. No wheezes noted. Heart regular rate and rhythm S1 S2.  No murmurs. Abdomen round non-distended. NABS x4. Soft non-tender. Left knee decreased range of motion. 2+ effusion. Joint line tender. Calf non-tender neurovascularly intact. Skin warm and dry. No increase in respiratory effort.   IMPRESSION: End stage degenerative joint disease left knee and chronic pain. Failed conservative treatment. Status post patellectomy 6/92.  PLAN: Will proceed with left total knee  replacement as scheduled. Discussed risks benefits and possible complications. All questions answered. Discussed smoking cessation and advise patient that he should not be using 2 different oral anti-inflammatory medications and explained the health risk of doing this.  Genene Churn. Barry Dienes, PA-C Electronically verified by Loreta Ave, M.D. DFM(JMO):kh D 63-05-03 T 63-05-03

## 2011-11-20 ENCOUNTER — Encounter (HOSPITAL_COMMUNITY)
Admission: RE | Disposition: A | Discharge status: Home Health | Payer: Self-pay | Source: Ambulatory Visit | Attending: Orthopedic Surgery

## 2011-11-20 ENCOUNTER — Inpatient Hospital Stay (HOSPITAL_COMMUNITY)
Admission: RE | Admit: 2011-11-20 | Discharge: 2011-11-22 | DRG: 470 | Disposition: A | Discharge status: Home Health | Payer: PRIVATE HEALTH INSURANCE | Source: Ambulatory Visit | Attending: Orthopedic Surgery | Admitting: Orthopedic Surgery

## 2011-11-20 ENCOUNTER — Ambulatory Visit (HOSPITAL_COMMUNITY): Payer: PRIVATE HEALTH INSURANCE | Admitting: Vascular Surgery

## 2011-11-20 ENCOUNTER — Encounter (HOSPITAL_COMMUNITY): Payer: Self-pay | Admitting: Vascular Surgery

## 2011-11-20 ENCOUNTER — Ambulatory Visit (HOSPITAL_COMMUNITY): Payer: PRIVATE HEALTH INSURANCE

## 2011-11-20 ENCOUNTER — Encounter (HOSPITAL_COMMUNITY): Payer: Self-pay | Admitting: *Deleted

## 2011-11-20 DIAGNOSIS — M1710 Unilateral primary osteoarthritis, unspecified knee: Principal | ICD-10-CM | POA: Diagnosis present

## 2011-11-20 DIAGNOSIS — F319 Bipolar disorder, unspecified: Secondary | ICD-10-CM | POA: Diagnosis present

## 2011-11-20 DIAGNOSIS — Z01812 Encounter for preprocedural laboratory examination: Secondary | ICD-10-CM

## 2011-11-20 DIAGNOSIS — Z471 Aftercare following joint replacement surgery: Secondary | ICD-10-CM

## 2011-11-20 DIAGNOSIS — F17200 Nicotine dependence, unspecified, uncomplicated: Secondary | ICD-10-CM | POA: Diagnosis present

## 2011-11-20 DIAGNOSIS — F1021 Alcohol dependence, in remission: Secondary | ICD-10-CM | POA: Diagnosis present

## 2011-11-20 HISTORY — PX: TOTAL KNEE ARTHROPLASTY: SHX125

## 2011-11-20 LAB — PROTIME-INR
INR: 1.02 (ref 0.00–1.49)
Prothrombin Time: 13.6 s (ref 11.6–15.2)

## 2011-11-20 SURGERY — ARTHROPLASTY, KNEE, TOTAL
Anesthesia: General | Site: Knee | Laterality: Left | Wound class: Clean

## 2011-11-20 MED ORDER — SUFENTANIL CITRATE 50 MCG/ML IV SOLN
INTRAVENOUS | Status: DC | PRN
  Administered 2011-11-20: 10 ug via INTRAVENOUS
  Administered 2011-11-20: 20 ug via INTRAVENOUS
  Administered 2011-11-20 (×2): 10 ug via INTRAVENOUS

## 2011-11-20 MED ORDER — MENTHOL 3 MG MT LOZG: 1.0000 | LOZENGE | OROMUCOSAL | Status: DC | PRN

## 2011-11-20 MED ORDER — DEXAMETHASONE SODIUM PHOSPHATE 4 MG/ML IJ SOLN
INTRAMUSCULAR | Status: DC | PRN
  Administered 2011-11-20: 4 mg via INTRAVENOUS

## 2011-11-20 MED ORDER — LIDOCAINE HCL 4 % MT SOLN
OROMUCOSAL | Status: DC | PRN
  Administered 2011-11-20: 4 mL via TOPICAL

## 2011-11-20 MED ORDER — ACETAMINOPHEN 325 MG PO TABS
650.0000 mg | ORAL_TABLET | Freq: Four times a day (QID) | ORAL | Status: DC | PRN
  Administered 2011-11-21: 650 mg via ORAL
  Filled 2011-11-20: qty 2
  Filled 2011-11-20: qty 1

## 2011-11-20 MED ORDER — BUPIVACAINE HCL (PF) 0.25 % IJ SOLN
INTRAMUSCULAR | Status: DC | PRN
  Administered 2011-11-20: 30 mL

## 2011-11-20 MED ORDER — DOCUSATE SODIUM 100 MG PO CAPS
100.0000 mg | ORAL_CAPSULE | Freq: Two times a day (BID) | ORAL | Status: DC
  Administered 2011-11-20 – 2011-11-22 (×4): 100 mg via ORAL
  Filled 2011-11-20 (×5): qty 1

## 2011-11-20 MED ORDER — MAGNESIUM CITRATE PO SOLN
1.0000 | Freq: Once | ORAL | Status: AC | PRN
  Filled 2011-11-20: qty 296

## 2011-11-20 MED ORDER — WARFARIN VIDEO: Freq: Once | Status: DC

## 2011-11-20 MED ORDER — PROPOFOL 10 MG/ML IV EMUL
INTRAVENOUS | Status: DC | PRN
  Administered 2011-11-20: 200 mg via INTRAVENOUS

## 2011-11-20 MED ORDER — ROCURONIUM BROMIDE 100 MG/10ML IV SOLN
INTRAVENOUS | Status: DC | PRN
  Administered 2011-11-20: 40 mg via INTRAVENOUS

## 2011-11-20 MED ORDER — DEXTROSE 5 % IV SOLN
500.0000 mg | Freq: Four times a day (QID) | INTRAVENOUS | Status: DC | PRN
  Administered 2011-11-20: 500 mg via INTRAVENOUS
  Filled 2011-11-20: qty 5

## 2011-11-20 MED ORDER — SODIUM CHLORIDE 0.9 % IR SOLN
Status: DC | PRN
  Administered 2011-11-20: 3000 mL
  Administered 2011-11-20: 1000 mL

## 2011-11-20 MED ORDER — ROPIVACAINE HCL 5 MG/ML IJ SOLN
INTRAMUSCULAR | Status: DC | PRN
  Administered 2011-11-20 (×2): 15 mL

## 2011-11-20 MED ORDER — PHENOL 1.4 % MT LIQD: 1.0000 | OROMUCOSAL | Status: DC | PRN

## 2011-11-20 MED ORDER — METHOCARBAMOL 500 MG PO TABS
500.0000 mg | ORAL_TABLET | Freq: Four times a day (QID) | ORAL | Status: DC | PRN
  Administered 2011-11-20 – 2011-11-22 (×4): 500 mg via ORAL
  Filled 2011-11-20 (×5): qty 1

## 2011-11-20 MED ORDER — COUMADIN BOOK
Freq: Once | Status: DC
  Filled 2011-11-20: qty 1

## 2011-11-20 MED ORDER — ACETAMINOPHEN 10 MG/ML IV SOLN
INTRAVENOUS | Status: AC
  Filled 2011-11-20: qty 100

## 2011-11-20 MED ORDER — LACTATED RINGERS IV SOLN
INTRAVENOUS | Status: DC
  Administered 2011-11-20: 12:00:00 via INTRAVENOUS

## 2011-11-20 MED ORDER — ACETAMINOPHEN 10 MG/ML IV SOLN
INTRAVENOUS | Status: DC | PRN
  Administered 2011-11-20: 1000 mg via INTRAVENOUS

## 2011-11-20 MED ORDER — FENTANYL CITRATE 0.05 MG/ML IJ SOLN
25.0000 ug | INTRAMUSCULAR | Status: DC | PRN
  Administered 2011-11-20 (×2): 25 ug via INTRAVENOUS

## 2011-11-20 MED ORDER — MIDAZOLAM HCL 5 MG/5ML IJ SOLN
INTRAMUSCULAR | Status: DC | PRN
  Administered 2011-11-20: 2 mg via INTRAVENOUS

## 2011-11-20 MED ORDER — LACTATED RINGERS IV SOLN
INTRAVENOUS | Status: DC | PRN
  Administered 2011-11-20 (×2): via INTRAVENOUS

## 2011-11-20 MED ORDER — WARFARIN SODIUM 7.5 MG PO TABS
7.5000 mg | ORAL_TABLET | Freq: Once | ORAL | Status: AC
  Administered 2011-11-20: 7.5 mg via ORAL
  Filled 2011-11-20: qty 1

## 2011-11-20 MED ORDER — ONDANSETRON HCL 4 MG PO TABS: 4.0000 mg | ORAL_TABLET | Freq: Four times a day (QID) | ORAL | Status: DC | PRN

## 2011-11-20 MED ORDER — CEFAZOLIN SODIUM 1-5 GM-% IV SOLN
1.0000 g | Freq: Three times a day (TID) | INTRAVENOUS | Status: AC
  Administered 2011-11-20 – 2011-11-21 (×3): 1 g via INTRAVENOUS
  Filled 2011-11-20 (×3): qty 50

## 2011-11-20 MED ORDER — ACETAMINOPHEN 325 MG PO TABS: 325.0000 mg | ORAL_TABLET | ORAL | Status: DC | PRN

## 2011-11-20 MED ORDER — DROPERIDOL 2.5 MG/ML IJ SOLN
INTRAMUSCULAR | Status: DC | PRN
  Administered 2011-11-20: 0.625 mg via INTRAVENOUS

## 2011-11-20 MED ORDER — ACETAMINOPHEN 650 MG RE SUPP: 650.0000 mg | Freq: Four times a day (QID) | RECTAL | Status: DC | PRN

## 2011-11-20 MED ORDER — POTASSIUM CHLORIDE IN NACL 20-0.9 MEQ/L-% IV SOLN
INTRAVENOUS | Status: DC
  Administered 2011-11-20: 19:00:00 via INTRAVENOUS
  Filled 2011-11-20 (×7): qty 1000

## 2011-11-20 MED ORDER — MIDAZOLAM HCL 2 MG/2ML IJ SOLN: 0.5000 mg | Freq: Once | INTRAMUSCULAR | Status: DC | PRN

## 2011-11-20 MED ORDER — KETOROLAC TROMETHAMINE 30 MG/ML IJ SOLN
15.0000 mg | Freq: Once | INTRAMUSCULAR | Status: AC | PRN
  Administered 2011-11-20: 30 mg via INTRAVENOUS

## 2011-11-20 MED ORDER — SENNOSIDES-DOCUSATE SODIUM 8.6-50 MG PO TABS: 1.0000 | ORAL_TABLET | Freq: Every evening | ORAL | Status: DC | PRN

## 2011-11-20 MED ORDER — MEPERIDINE HCL 25 MG/ML IJ SOLN: 6.2500 mg | INTRAMUSCULAR | Status: DC | PRN

## 2011-11-20 MED ORDER — ENOXAPARIN SODIUM 30 MG/0.3ML ~~LOC~~ SOLN
30.0000 mg | Freq: Two times a day (BID) | SUBCUTANEOUS | Status: DC
  Administered 2011-11-21 – 2011-11-22 (×3): 30 mg via SUBCUTANEOUS
  Filled 2011-11-20 (×5): qty 0.3

## 2011-11-20 MED ORDER — OXYCODONE-ACETAMINOPHEN 5-325 MG PO TABS
1.0000 | ORAL_TABLET | ORAL | Status: DC | PRN
  Administered 2011-11-20 – 2011-11-21 (×4): 2 via ORAL
  Filled 2011-11-20 (×5): qty 2

## 2011-11-20 MED ORDER — PROMETHAZINE HCL 25 MG/ML IJ SOLN: 6.2500 mg | INTRAMUSCULAR | Status: DC | PRN

## 2011-11-20 MED ORDER — MORPHINE SULFATE 4 MG/ML IJ SOLN
INTRAMUSCULAR | Status: DC | PRN
Start: 2011-11-20 — End: 2011-11-20
  Administered 2011-11-20: 4 mg via INTRAVENOUS

## 2011-11-20 MED ORDER — WARFARIN - PHARMACIST DOSING INPATIENT: Freq: Every day | Status: DC

## 2011-11-20 MED ORDER — LIDOCAINE HCL (CARDIAC) 20 MG/ML IV SOLN
INTRAVENOUS | Status: DC | PRN
  Administered 2011-11-20: 100 mg via INTRAVENOUS

## 2011-11-20 MED ORDER — ONDANSETRON HCL 4 MG/2ML IJ SOLN
INTRAMUSCULAR | Status: DC | PRN
  Administered 2011-11-20: 4 mg via INTRAVENOUS

## 2011-11-20 MED ORDER — ONDANSETRON HCL 4 MG/2ML IJ SOLN: 4.0000 mg | Freq: Four times a day (QID) | INTRAMUSCULAR | Status: DC | PRN

## 2011-11-20 SURGICAL SUPPLY — 56 items
BANDAGE ESMARK 6X9 LF (GAUZE/BANDAGES/DRESSINGS) ×1 IMPLANT
BLADE SAG 18X100X1.27 (BLADE) ×2 IMPLANT
BNDG CMPR 9X6 STRL LF SNTH (GAUZE/BANDAGES/DRESSINGS) ×1
BNDG ESMARK 6X9 LF (GAUZE/BANDAGES/DRESSINGS) ×2
BOOTCOVER CLEANROOM LRG (PROTECTIVE WEAR) ×4 IMPLANT
BOWL SMART MIX CTS (DISPOSABLE) ×2 IMPLANT
CEMENT BONE SIMPLEX SPEEDSET (Cement) ×4 IMPLANT
CLOTH BEACON ORANGE TIMEOUT ST (SAFETY) ×2 IMPLANT
COVER BACK TABLE 24X17X13 BIG (DRAPES) ×2 IMPLANT
COVER SURGICAL LIGHT HANDLE (MISCELLANEOUS) ×2 IMPLANT
CUFF TOURNIQUET SINGLE 34IN LL (TOURNIQUET CUFF) ×2 IMPLANT
DRAPE EXTREMITY T 121X128X90 (DRAPE) ×2 IMPLANT
DRAPE PROXIMA HALF (DRAPES) ×2 IMPLANT
DRAPE U-SHAPE 47X51 STRL (DRAPES) ×2 IMPLANT
DRSG PAD ABDOMINAL 8X10 ST (GAUZE/BANDAGES/DRESSINGS) ×2 IMPLANT
DURAPREP 26ML APPLICATOR (WOUND CARE) ×2 IMPLANT
ELECT CAUTERY BLADE 6.4 (BLADE) ×2 IMPLANT
ELECT REM PT RETURN 9FT ADLT (ELECTROSURGICAL) ×2
ELECTRODE REM PT RTRN 9FT ADLT (ELECTROSURGICAL) ×1 IMPLANT
EVACUATOR 1/8 PVC DRAIN (DRAIN) ×2 IMPLANT
FACESHIELD LNG OPTICON STERILE (SAFETY) ×2 IMPLANT
GAUZE XEROFORM 5X9 LF (GAUZE/BANDAGES/DRESSINGS) ×2 IMPLANT
GLOVE BIOGEL PI IND STRL 8 (GLOVE) ×1 IMPLANT
GLOVE BIOGEL PI INDICATOR 8 (GLOVE) ×1
GLOVE ORTHO TXT STRL SZ7.5 (GLOVE) ×2 IMPLANT
GOWN PREVENTION PLUS XLARGE (GOWN DISPOSABLE) ×4 IMPLANT
GOWN STRL NON-REIN LRG LVL3 (GOWN DISPOSABLE) ×4 IMPLANT
GOWN STRL REIN 2XL XLG LVL4 (GOWN DISPOSABLE) ×2 IMPLANT
HANDPIECE INTERPULSE COAX TIP (DISPOSABLE) ×2
IMMOBILIZER KNEE 22 UNIV (SOFTGOODS) ×2 IMPLANT
IMMOBILIZER KNEE 24 THIGH 36 (MISCELLANEOUS) IMPLANT
IMMOBILIZER KNEE 24 UNIV (MISCELLANEOUS)
KIT BASIN OR (CUSTOM PROCEDURE TRAY) ×2 IMPLANT
KIT ROOM TURNOVER OR (KITS) ×2 IMPLANT
MANIFOLD NEPTUNE II (INSTRUMENTS) ×2 IMPLANT
NS IRRIG 1000ML POUR BTL (IV SOLUTION) ×2 IMPLANT
PACK TOTAL JOINT (CUSTOM PROCEDURE TRAY) ×2 IMPLANT
PAD ARMBOARD 7.5X6 YLW CONV (MISCELLANEOUS) ×4 IMPLANT
PAD CAST 4YDX4 CTTN HI CHSV (CAST SUPPLIES) ×1 IMPLANT
PADDING CAST COTTON 4X4 STRL (CAST SUPPLIES) ×2
PADDING CAST COTTON 6X4 STRL (CAST SUPPLIES) ×2 IMPLANT
RUBBERBAND STERILE (MISCELLANEOUS) ×2 IMPLANT
SET HNDPC FAN SPRY TIP SCT (DISPOSABLE) ×1 IMPLANT
SPONGE GAUZE 4X4 12PLY (GAUZE/BANDAGES/DRESSINGS) ×2 IMPLANT
STAPLER VISISTAT 35W (STAPLE) ×2 IMPLANT
SUCTION FRAZIER TIP 10 FR DISP (SUCTIONS) ×2 IMPLANT
SUT VIC AB 1 CTX 36 (SUTURE) ×4
SUT VIC AB 1 CTX36XBRD ANBCTR (SUTURE) ×2 IMPLANT
SUT VIC AB 2-0 CT1 27 (SUTURE) ×4
SUT VIC AB 2-0 CT1 TAPERPNT 27 (SUTURE) ×2 IMPLANT
SYR 30ML LL (SYRINGE) ×2 IMPLANT
SYR 30ML SLIP (SYRINGE) ×2 IMPLANT
TOWEL OR 17X24 6PK STRL BLUE (TOWEL DISPOSABLE) ×2 IMPLANT
TOWEL OR 17X26 10 PK STRL BLUE (TOWEL DISPOSABLE) ×2 IMPLANT
TRAY FOLEY CATH 14FR (SET/KITS/TRAYS/PACK) ×2 IMPLANT
WATER STERILE IRR 1000ML POUR (IV SOLUTION) ×2 IMPLANT

## 2011-11-20 NOTE — Progress Notes (Signed)
ANTICOAGULATION CONSULT NOTE - Initial Consult  Pharmacy Consult for Coumadin Indication: VTE prophylaxis  Allergies  Allergen Reactions  . Codeine Nausea Only    Patient Measurements: Wt = 81 kg  Vital Signs: Temp: 98 F (36.7 C) (03/20 1715) Temp src: Oral (03/20 0959) BP: 113/69 mmHg (03/20 1730) Pulse Rate: 67  (03/20 1730)  Labs: No results found for this basename: HGB:2,HCT:3,PLT:3,APTT:3,LABPROT:3,INR:3,HEPARINUNFRC:3,CREATININE:3,CKTOTAL:3,CKMB:3,TROPONINI:3 in the last 72 hours The CrCl is unknown because both a height and weight (above a minimum accepted value) are required for this calculation.  Medical History: Past Medical History  Diagnosis Date  . Osteoarthritis X years    left knee.  Distant hx (age 23) "dislocated" knee and required surgery, then crush injury to patella required knee cap removal.  . Diverticular disease     "itis" x 2 episodes (last was about 2007)  . Tobacco dependence   . Multiple rib fractures 10/09/10    s/p fall down a ravine--9th and 10th on right, contusion of left 7th and 8th ribs  . Fracture of metatarsal bone(s), closed     3rd, 4th, 5th mid shaft on left foot  . History of substance abuse     cocaine, marijuana, acid, alcohol (in remission since 2010)  . Solar dermatitis     face and ears  . Bipolar disorder     Has had multiple psych hospitalization in the past, most recently at [facility] 01/31/11-02/04/11.  Says meds made him emotionally blunted. (lamictal and wellbutrin).  . Alcoholism     "Dry" since 2002  . Depression     Medications:  Prescriptions prior to admission  Medication Sig Dispense Refill  . acetaminophen (TYLENOL) 500 MG tablet Take 1,000 mg by mouth every 6 (six) hours as needed. For pain      . buPROPion (WELLBUTRIN SR) 150 MG 12 hr tablet Take 150 mg by mouth 2 (two) times daily.      Marland Kitchen HYDROcodone-acetaminophen (VICODIN) 5-500 MG per tablet Take 1-2 tablets by mouth every 6 (six) hours as  needed. For pain      . tadalafil (CIALIS) 5 MG tablet Take 1 tablet (5 mg total) by mouth daily.  30 tablet  1    Assessment: 61 year old s/p TKA, beginning Coumadin for VTE prophylaxis  Goal of Therapy:  INR 2-3   Plan:  1) Coumadin 7.5 mg po x 1 dose tonight 2) Daily INR 3) Coumadin book / video  Elwin Sleight 11/20/2011,6:12 PM

## 2011-11-20 NOTE — Interval H&P Note (Signed)
History and Physical Interval Note:  11/20/2011 8:21 AM  Shawn [name]  has presented today for surgery, with the diagnosis of DJD LEFT KNEE  The various methods of treatment have been discussed with the patient and family. After consideration of risks, benefits and other options for treatment, the patient has consented to  Procedure(s) (LRB): TOTAL KNEE ARTHROPLASTY (Left) as a surgical intervention .  The patients' history has been reviewed, patient examined, no change in status, stable for surgery.  I have reviewed the patients' chart and labs.  Questions were answered to the patient's satisfaction.     Dennison Mcdaid F

## 2011-11-20 NOTE — Preoperative (Signed)
Beta Blockers   Reason not to administer Beta Blockers:Not Applicable 

## 2011-11-20 NOTE — Plan of Care (Signed)
Problem: Consults Goal: Diagnosis- Total Joint Replacement Outcome: Completed/Met Date Met:  11/20/11 Primary Total Knee

## 2011-11-20 NOTE — Anesthesia Postprocedure Evaluation (Signed)
Anesthesia Post Note  Patient: Shawn [name]  Procedure(s) Performed: Procedure(s) (LRB): TOTAL KNEE ARTHROPLASTY (Left)  Anesthesia type: GA  Patient location: PACU  Post pain: Pain level controlled  Post assessment: Post-op Vital signs reviewed  Last Vitals:  Filed Vitals:   11/20/11 1515  BP: 136/75  Pulse: 86  Temp: 36.2 C  Resp: 15    Post vital signs: Reviewed  Level of consciousness: sedated  Complications: No apparent anesthesia complications

## 2011-11-20 NOTE — Progress Notes (Signed)
Orthopedic Tech Progress Note Patient Details:  Shawn [name] 03-21-1951 [id number]  CPM Left Knee CPM Left Knee: On Left Knee Flexion (Degrees): 0  Left Knee Extension (Degrees): [address]    [name] 11/20/2011, 4:35 PM

## 2011-11-20 NOTE — Brief Op Note (Signed)
11/20/2011  2:49 PM  PATIENT:  Shawn [name]  61 y.o. male  PRE-OPERATIVE DIAGNOSIS:  DJD LEFT KNEE  POST-OPERATIVE DIAGNOSIS:  DJD LEFT KNEE  PROCEDURE:  Procedure(s) (LRB): TOTAL KNEE ARTHROPLASTY (Left)  SURGEON:  Surgeon(s) and Role:    * Loreta Ave, MD - Primary  PHYSICIAN ASSISTANT: Zonia Kief M  ANESTHESIA:   regional and general  EBL:  Total I/O In: 1000 [I.V.:1000] Out: 150 [Urine:150]  SPECIMEN:  No Specimen  DISPOSITION OF SPECIMEN:  N/A  COUNTS:  YES  TOURNIQUET:   Total Tourniquet Time Documented: Thigh (Left) - 79 minutes   PATIENT DISPOSITION:  PACU - hemodynamically stable.

## 2011-11-20 NOTE — Transfer of Care (Signed)
Immediate Anesthesia Transfer of Care Note  Patient: Shawn [name]  Procedure(s) Performed: Procedure(s) (LRB): TOTAL KNEE ARTHROPLASTY (Left)  Patient Location: PACU  Anesthesia Type: General  Level of Consciousness: awake  Airway & Oxygen Therapy: Patient Spontanous Breathing  Post-op Assessment: Report given to PACU RN  Post vital signs: stable  Complications: No apparent anesthesia complications

## 2011-11-20 NOTE — Anesthesia Preprocedure Evaluation (Signed)
Anesthesia Evaluation  Patient identified by MRN, date of birth, ID band Patient awake    Reviewed: Allergy & Precautions, H&P , Patient's Chart, lab work & pertinent test results, reviewed documented beta blocker date and time   History of Anesthesia Complications Negative for: history of anesthetic complications  Airway Mallampati: II TM Distance: >3 FB Neck ROM: full    Dental No notable dental hx.    Pulmonary neg pulmonary ROS,  breath sounds clear to auscultation  Pulmonary exam normal       Cardiovascular Exercise Tolerance: Good negative cardio ROS  Rhythm:regular Rate:Normal     Neuro/Psych negative neurological ROS  negative psych ROS   GI/Hepatic negative GI ROS, Neg liver ROS,   Endo/Other  negative endocrine ROS  Renal/GU negative Renal ROS     Musculoskeletal   Abdominal   Peds  Hematology negative hematology ROS (+)   Anesthesia Other Findings Osteoarthritis X years left knee.  Distant hx (age 24) "dislocated" knee and required surgery, then crush injury to patella required knee cap removal. Diverticular disease   "itis" x 2 episodes (last was about 2007)    Tobacco dependence     Multiple rib fractures 10/09/10 s/p fall down a ravine--9th and 10th on right, contusion of left 7th and 8th ribs    Fracture of metatarsal bone(s), closed   3rd, 4th, 5th mid shaft on left foot History of substance abuse   cocaine, marijuana, acid, alcohol (in remission since 2010)    Solar dermatitis   face and ears Bipolar disorder   Has had multiple psych hospitalization in the past, most recently at [facility] 01/31/11-02/04/11.  Says meds made him emotionally blunted. (lamictal and wellbutrin).    Alcoholism   "Dry" since 2002 Depression    Reproductive/Obstetrics negative OB ROS                           Anesthesia Physical Anesthesia Plan  ASA: II  Anesthesia Plan: General ETT and  Regional   Post-op Pain Management:    Induction:   Airway Management Planned:   Additional Equipment:   Intra-op Plan:   Post-operative Plan:   Informed Consent: I have reviewed the patients History and Physical, chart, labs and discussed the procedure including the risks, benefits and alternatives for the proposed anesthesia with the patient or authorized representative who has indicated his/her understanding and acceptance.   Dental Advisory Given  Plan Discussed with: CRNA and Surgeon  Anesthesia Plan Comments:         Anesthesia Quick Evaluation

## 2011-11-20 NOTE — Progress Notes (Signed)
Orthopedic Tech Progress Note Patient Details:  Shawn [name] September 02, 1951 [id number] Trapeze bar patient helper     Nikki Dom 11/20/2011, 6:38 PM

## 2011-11-20 NOTE — Anesthesia Procedure Notes (Signed)
Anesthesia Regional Block:  Supraclavicular block  Pre-Anesthetic Checklist: ,, timeout performed, Correct Patient, Correct Site, Correct Laterality, Correct Procedure, Correct Position, site marked, Risks and benefits discussed,  Surgical consent,  Pre-op evaluation,  At surgeon's request and post-op pain management  Laterality: Left  Prep: chloraprep       Needles:  Injection technique: Single-shot  Needle Type: Stimiplex          Additional Needles:  Procedures: ultrasound guided Supraclavicular block Narrative:  Start time: 11/20/2011 11:34 AM End time: 11/20/2011 11:44 AM Injection made incrementally with aspirations every 5 mL.  Performed by: Personally   Additional Notes: Risks, benefits and alternative to block explained extensively.  Patient tolerated procedure well, without complications.  Supraclavicular block

## 2011-11-21 ENCOUNTER — Other Ambulatory Visit: Payer: Self-pay

## 2011-11-21 ENCOUNTER — Encounter (HOSPITAL_COMMUNITY): Payer: Self-pay | Admitting: Orthopedic Surgery

## 2011-11-21 LAB — COMPREHENSIVE METABOLIC PANEL
AST: 51 U/L — ABNORMAL HIGH (ref 0–37)
BUN: 7 mg/dL (ref 6–23)
CO2: 24 mEq/L (ref 19–32)
Calcium: 8.5 mg/dL (ref 8.4–10.5)
Chloride: 99 mEq/L (ref 96–112)
Creatinine, Ser: 0.66 mg/dL (ref 0.50–1.35)
GFR calc non Af Amer: >90 mL/min (ref >90)
Total Bilirubin: 0.7 mg/dL (ref 0.3–1.2)

## 2011-11-21 LAB — CBC
HCT: 34.5 % — ABNORMAL LOW (ref 39.0–52.0)
HCT: 35.4 % — ABNORMAL LOW (ref 39.0–52.0)
Hemoglobin: 12.1 g/dL — ABNORMAL LOW (ref 13.0–17.0)
Hemoglobin: 12.6 g/dL — ABNORMAL LOW (ref 13.0–17.0)
MCH: 31.1 pg (ref 26.0–34.0)
MCH: 31.3 pg (ref 26.0–34.0)
MCHC: 35.1 g/dL (ref 30.0–36.0)
MCHC: 35.6 g/dL (ref 30.0–36.0)
MCV: 88.7 fL (ref 78.0–100.0)
MCV: 87.8 fL (ref 78.0–100.0)
Platelets: 203 10*3/uL (ref 150–400)
Platelets: 205 10*3/uL (ref 150–400)
RBC: 3.89 MIL/uL — ABNORMAL LOW (ref 4.22–5.81)
RBC: 4.03 MIL/uL — ABNORMAL LOW (ref 4.22–5.81)
RDW: 13.3 % (ref 11.5–15.5)
RDW: 13.2 % (ref 11.5–15.5)
WBC: 8.4 10*3/uL (ref 4.0–10.5)
WBC: 8.9 10*3/uL (ref 4.0–10.5)

## 2011-11-21 LAB — BASIC METABOLIC PANEL
BUN: 9 mg/dL (ref 6–23)
Creatinine, Ser: 0.74 mg/dL (ref 0.50–1.35)
GFR calc Af Amer: >90 mL/min (ref >90)
GFR calc non Af Amer: >90 mL/min (ref >90)
Glucose, Bld: 120 mg/dL — ABNORMAL HIGH (ref 70–99)

## 2011-11-21 MED ORDER — WARFARIN SODIUM 7.5 MG PO TABS
7.5000 mg | ORAL_TABLET | Freq: Once | ORAL | Status: AC
  Administered 2011-11-21: 7.5 mg via ORAL
  Filled 2011-11-21: qty 1

## 2011-11-21 MED ORDER — OXYCODONE-ACETAMINOPHEN 5-325 MG PO TABS
1.0000 | ORAL_TABLET | ORAL | Status: DC | PRN
  Administered 2011-11-22: 2 via ORAL
  Filled 2011-11-21: qty 2

## 2011-11-21 MED ORDER — OXYCODONE HCL 5 MG PO TABS
5.0000 mg | ORAL_TABLET | ORAL | Status: DC | PRN
  Administered 2011-11-22: 10 mg via ORAL
  Filled 2011-11-21: qty 2

## 2011-11-21 MED ORDER — OXYCODONE HCL 5 MG PO TABS
10.0000 mg | ORAL_TABLET | ORAL | Status: AC | PRN
  Administered 2011-11-21 (×2): 20 mg via ORAL
  Filled 2011-11-21 (×2): qty 4

## 2011-11-21 NOTE — Progress Notes (Signed)
CARE MANAGEMENT NOTE 11/21/2011      Action/Plan:   Spoke with patient and wife. Preoperatively setup with [facility], no changes. Roling walker, 3in1 and CPM have been delivered to the home.   Anticipated DC Date:  11/23/2011   Anticipated DC Plan:  HOME W HOME HEALTH SERVICES      DC Planning Services  CM consult      [facility] Choice  HOME HEALTH   Choice offered to / List presented to:  C-1 Patient   DME arranged  NA      DME agency  NA     HH arranged  HH-2 PT  HH-1 RN      [facility] agency  [facility]   Status of service: In process

## 2011-11-21 NOTE — Op Note (Signed)
NAME:  Shawn [name], Shawn [name] NO.:  [id number]  MEDICAL RECORD NO.:  [id number]  LOCATION:  5031                         FACILITY:  [facility]  PHYSICIAN:  Loreta Ave, M.D. DATE OF BIRTH:  Nov 23, 1950  DATE OF PROCEDURE:  11/20/2011 DATE OF DISCHARGE:                              OPERATIVE REPORT   PREOPERATIVE DIAGNOSES:  Left knee end-stage degenerative arthritis. Marked bone loss, medial compartment.  Varus alignment and flexion contracture.  Numerous previous operative interventions including previous complete patellectomy.  POSTOPERATIVE DIAGNOSES:  Left knee end-stage degenerative arthritis. Marked bone loss, medial compartment.  Varus alignment and flexion contracture.  Numerous previous operative interventions including previous complete patellectomy with underlying osteopenia from tissues.  PROCEDURE:  Left total knee replacement, Stryker triathlon prosthesis. Modified minimally invasive.  Soft tissue balancing.  Cemented pegged posterior stabilized #6 femoral component.  Cemented #7 tibial component with a 9-mm polyethylene insert.  SURGEON:  Loreta Ave, MD  ASSISTANT:  Genene Churn. Barry Dienes, [place], present throughout the entire case, necessary for timely completion of procedure.  ANESTHESIA:  General.  BLOOD LOSS:  Minimal.  SPECIMENS:  None.  CULTURES:  None.  COMPLICATION:  None.  DRESSINGS:  Soft compressive knee immobilizer.  DRAINS:  Hemovac x1.  TOURNIQUET TIME:  1 hour and 50 minutes.  PROCEDURE:  The patient was brought to the operating room, placed on the operating room table in supine position.  After adequate anesthesia had been obtained, knee examined.  Previous patellectomy.  The quad patellar tendon had been tubed over the front of the knee.  Almost 10 degrees of varus, not very correctable.  More than 5-degree of flexion contracture. Marked grading crepitus.  Further flexion maybe 100 degrees.  After the tourniquet was applied,  exsanguinated with elevation and Esmarch. Tourniquet inflated to 350 mmHg.  Previous anterior incision utilized. Skin and subcutaneous tissues divided.  A medial arthrotomy adjacent to the tubed patellar tendon extending up into the vastus preserving the overall continuity of the extensor tendon.  Knee exposed.  Marked adhesions from numerous operative interventions debrided.  ACL completely deficient.  Periarticular spurs removed.  Distal femur exposed.  Intramedullary guide placed.  A 10-mm resection, 5 degrees of valgus.  Very osteopenic.  Using epicondylar axis, the femur was sized, cut, and fitted for a pegged posterior stabilized #6 component. Proximal tibial resection below the defect medially.  Sized to a #7 component.  Sclerotic bone on the medial side treated with multiple drilling.  Able to get a nicely balanced knee with the medial capsular release and bony resection.  Debris cleared throughout the knee.  Trials put in place.  A #6 above, 7 below and a 9-mm insert.  Taken through motion.  Good biomechanical axis, good correction of deformity, full motion, nicely balanced in flexion and extension.  Tibia was marked for rotation and hand reamed.  All trials were removed.  Copious irrigation with a pulse irrigating device.  Cement prepared, placed on all components, firmly seated.  Polyethylene attached to the tibia and knee reduced.  After the cement hardened, reexamined.  I was again pleased with alignment and stability.  Wound irrigated.  Hemovac placed. Arthrotomy closed with #1 Vicryl,  skin and subcutaneous tissue with Vicryl and staples.  Sterile compressive dressing applied.  Tourniquet deflated and removed.  Knee immobilizer applied.  Anesthesia reversed. Brought to the recovery room.  Tolerated the surgery well.  No complications.     Loreta Ave, M.D.     DFM/MEDQ  D:  11/20/2011  T:  11/21/2011  Job:  [id number]

## 2011-11-21 NOTE — Progress Notes (Addendum)
Entered pt's room to assess pain/condition. Pt sitting on EOB with wife beside him. Pt seemed very relaxed, states he is feeling better.Almost seemed to be acting a bit loopy from pain meds.  Assisted him back into bed, onto his left side in hopes that he can get some sleep. Tammy Sours

## 2011-11-21 NOTE — Progress Notes (Signed)
Subjective: Doing well.  No complaints.    Objective: Vital signs in last 24 hours: Temp:  [97.2 F (36.2 C)-98.8 F (37.1 C)] 98.8 F (37.1 C) (03/21 0539) Pulse Rate:  [66-86] 82  (03/21 0539) Resp:  [8-18] 18  (03/21 0539) BP: (113-136)/(65-96) 118/70 mmHg (03/21 0539) SpO2:  [92 %-99 %] 92 % (03/21 0539) Weight:  [81.012 kg (178 lb 9.6 oz)] 81.012 kg (178 lb 9.6 oz) (03/20 1755)  Intake/Output from previous day: 03/20 0701 - 03/21 0700 In: 2425 [P.O.:125; I.V.:2200; IV Piggyback:100] Out: 275 [Urine:275] Intake/Output this shift: Total I/O In: -  Out: 575 [Urine:400; Drains:175]   Basename 11/21/11 0710  HGB 12.1*    Basename 11/21/11 0710  WBC 8.4  RBC 3.89*  HCT 34.5*  PLT 203    Basename 11/21/11 0710  NA 139  K 3.8  CL 104  CO2 26  BUN 9  CREATININE 0.74  GLUCOSE 120*  CALCIUM 8.5    Basename 11/21/11 0710 11/20/11 1841  LABPT -- --  INR 1.07 1.02    Neurovascular intact Compartment soft  Assessment/Plan: PT consult today. Anticipate d/c home tomorrow.  D/c pca, foley.      Shawn [name] M 11/21/2011, 11:09 AM

## 2011-11-21 NOTE — Progress Notes (Signed)
ANTICOAGULATION CONSULT NOTE - Follow-Up Consult  Pharmacy Consult for Coumadin Indication: VTE prophylaxis s/p L TKA  Allergies  Allergen Reactions  . Codeine Nausea Only    Patient Measurements: Wt = 81 kg  Vital Signs: Temp: 98.8 F (37.1 C) (03/21 0539) BP: 118/70 mmHg (03/21 0539) Pulse Rate: 82  (03/21 0539)  Labs:  Basename 11/21/11 0710 11/20/11 1841  HGB 12.1* --  HCT 34.5* --  PLT 203 --  APTT -- --  LABPROT 14.1 13.6  INR 1.07 1.02  HEPARINUNFRC -- --  CREATININE 0.74 --  CKTOTAL -- --  CKMB -- --  TROPONINI -- --   Estimated Creatinine Clearance: 106.8 ml/min (by C-G formula based on Cr of 0.74).  Assessment: 61 year old s/p L TKA on Coumadin for VTE prophylaxis. INR with no movement past first dose yesterday. No bleeding noted. Also on lovenox 30mg  SQ q12h until INR >/= 1.8.  Goal of Therapy:  INR 2-3   Plan:  1) Coumadin 7.5 mg po x 1 dose again tonight 2) Daily INR  Christoper Fabian, PharmD, BCPS Clinical pharmacist, pager [phone] 11/21/2011,1:02 PM

## 2011-11-21 NOTE — Progress Notes (Signed)
Physical Therapy Treatment Patient Details Name: Shawn [name] MRN: [id number] DOB: July 31, 1951 Today's Date: 11/21/2011  PT Assessment/Plan  PT - Assessment/Plan Comments on Treatment Session: Wife present for session this afternoon. Patient with increased left LE pain, required increased physical assist, and did not tolerate OOB. During session, surgeon mentioned possibility of d/c home tomorrow. Patient and wife report they do not feel ready for this transition due to level of care patient requires at this time. Patient may therefore benefit from short term SNF stay for further therapies prior to d/c home. Due to medical history, patient is likely not CIR candidate at this time.  PT will follow-up with patient in the morning to assess mobility and pain status. Patient and wife to discuss continued therapy in SNF setting and let team know in the morning. PT Plan: Discharge plan needs to be updated;Other (comment) (? d/c SNF due to higher level of care this pm) PT Goals  Acute Rehab PT Goals PT Goal: Supine/Side to Sit - Progress: Progressing toward goal PT Goal: Sit to Supine/Side - Progress: Progressing toward goal PT Goal: Sit to Stand - Progress: Progressing toward goal PT Goal: Stand to Sit - Progress: Progressing toward goal PT Transfer Goal: Bed to Chair/Chair to Bed - Progress: Progressing toward goal PT Goal: Ambulate - Progress: Progressing toward goal PT Goal: Up/Down Stairs - Progress: Progressing toward goal PT Goal: Perform Home Exercise Program - Progress: Progressing toward goal  PT Treatment Precautions/Restrictions  Precautions Precautions: Fall Required Braces or Orthoses: Yes Knee Immobilizer: On except when in CPM Restrictions Weight Bearing Restrictions: Yes LLE Weight Bearing: Weight bearing as tolerated Mobility (including Balance) Bed Mobility Bed Mobility: Yes Supine to Sit: 2: Max assist;HOB elevated (Comment degrees);With rails (35 degrees) Supine to Sit  Details (indicate cue type and reason): focus on sequencing, assist for left LE management.  Sit to Supine: 2: Max assist;With rail;HOB elevated (comment degrees) (35 degrees) Sit to Supine - Details (indicate cue type and reason): significantly increased pain with mobility attempt this afternoon resulting in increased physical assist Scooting to [facility]: 1: +1 Total assist;With trapeze Scooting to [facility] Details (indicate cue type and reason): cues for use of right LE and bilat UEs to assist and sequence task      End of Session PT - End of Session Activity Tolerance: Patient limited by pain Patient left: in bed;with call bell in reach;with family/visitor present General Behavior During Session: [facility] for tasks performed Cognition: [facility] for tasks performed  Romeo Rabon 11/21/2011, 4:00 PM

## 2011-11-21 NOTE — Progress Notes (Signed)
Pt was very cooperative this morning, did well with therapy (see PT notes). After lunch, assisted pt back to bed, gave him po pain meds. About 30 minutes later, pt was put in CPM machine, began fidgeting in bed, attempting to move leg out of CPM. He began trembling. I asked pt if he drank alcohol when I noticed his tremors. His response with agitation was "I am not an alcoholic." I told him that I was not insinuating that, but for me to better assist him, I needed to know this information. I calmed the pt down, although he was still trembling;  covered with two extra blankets.  After 2nd PT session, PT spoke with me regarding pt's condition and inability to do much in therapy. She tells me that pt continued to tremble and was not very motivated to work (see her note).  Spoke with Zonia Kief, PA about pt's condition. STAT EKG, CBC, CMET were ordered. EKG showed no changes from previous. Entered pt's room to assess once again; was not trembling, but when asked how he felt, he states "I just feel weak." He was positioned on the right side of the bed. Assisted him back to center of bed. Dr. Eulah Pont called pt while I was in his room. He tells Dr. Eulah Pont that he is "feeling okay, a little better." He hangs up the phone and tells me "I just feel weird, I guess it's because I am so tired." Zonia Kief, PA calls me back regarding pt & Dr. Greig Right conversation, stating pt seemed fine. I was in the pt's room and said in front of pt and wife, "I believe that he is being more honest with me than he is with you and Dr. Eulah Pont." Gave pt po pain meds once again. After he eats dinner, I will assist him in a different position, other than on his back to help him get some rest. Will continue to monitor. Tammy Sours

## 2011-11-21 NOTE — Evaluation (Signed)
Physical Therapy Evaluation Patient Details Name: Shawn [name] MRN: [id number] DOB: 05-31-1951 Today's Date: 11/21/2011  Problem List:  Patient Active Problem List  Diagnoses  . Erectile dysfunction  . Osteoarthritis of left knee  . Elevated blood pressure reading without diagnosis of hypertension  . History of depression  . Tobacco dependence  . Prostate cancer screening  . Health maintenance examination  . Hypoxia    Past Medical History:  Past Medical History  Diagnosis Date  . Osteoarthritis X years    left knee.  Distant hx (age 70) "dislocated" knee and required surgery, then crush injury to patella required knee cap removal.  . Diverticular disease     "itis" x 2 episodes (last was about 2007)  . Tobacco dependence   . Multiple rib fractures 10/09/10    s/p fall down a ravine--9th and 10th on right, contusion of left 7th and 8th ribs  . Fracture of metatarsal bone(s), closed     3rd, 4th, 5th mid shaft on left foot  . History of substance abuse     cocaine, marijuana, acid, alcohol (in remission since 2010)  . Solar dermatitis     face and ears  . Bipolar disorder     Has had multiple psych hospitalization in the past, most recently at [facility] 01/31/11-02/04/11.  Says meds made him emotionally blunted. (lamictal and wellbutrin).  . Alcoholism     "Dry" since 2002  . Depression    Past Surgical History:  Past Surgical History  Procedure Date  . Knee dislocation surgery 1966  . Patellectomy 1968    s/p crush injury  . Inguinal hernia repair     left  . Hernia repair     at 61 years of age  65    PT Assessment/Plan/Recommendation PT Assessment Clinical Impression Statement: Patient is a 61 y.o male s/p left TKR. Patient currently presents with significant left LE pain, decreased AROM and PROM left knee, decreased bilat LE strength left > right, decreased bilat LE power and eccentric control, decreased knowledge of safe use of DME, and overall  decreased activity tolerance resulting in decreased functional independence and increased risk of fall. PT to follow at frequency of 7x/week, BID as able. Recommend 24/7 supervision, use of RW, and follow-up HHPT at d/c. Patient in agreement with this plan. PT Recommendation/Assessment: Patient will need skilled PT in the acute care venue PT Problem List: Decreased strength;Decreased range of motion;Decreased activity tolerance;Decreased balance;Decreased mobility;Decreased knowledge of use of DME;Pain Barriers to Discharge: None PT Therapy Diagnosis : Difficulty walking;Generalized weakness;Acute pain PT Plan PT Frequency: 7X/week (BID as able) PT Treatment/Interventions: DME instruction;Gait training;Stair training;Functional mobility training;Therapeutic activities;Therapeutic exercise;Balance training;Patient/family education PT Recommendation Follow Up Recommendations: Home health PT Equipment Recommended: None recommended by PT (owns RW to be used for all mobility at d/c) PT Goals  Acute Rehab PT Goals PT Goal Formulation: With patient Time For Goal Achievement: 7 days Pt will go Supine/Side to Sit: with modified independence;with HOB 0 degrees PT Goal: Supine/Side to Sit - Progress: Goal set today Pt will go Sit to Supine/Side: with modified independence;with HOB 0 degrees PT Goal: Sit to Supine/Side - Progress: Goal set today Pt will go Sit to Stand: with supervision;with upper extremity assist PT Goal: Sit to Stand - Progress: Goal set today Pt will go Stand to Sit: with supervision;with upper extremity assist PT Goal: Stand to Sit - Progress: Goal set today Pt will Transfer Bed to Chair/Chair to Bed: with supervision  PT Transfer Goal: Bed to Chair/Chair to Bed - Progress: Goal set today Pt will Ambulate: 51 - 150 feet;with supervision;with least restrictive assistive device PT Goal: Ambulate - Progress: Goal set today Pt will Go Up / Down Stairs: 3-5 stairs;with rail(s);with  supervision PT Goal: Up/Down Stairs - Progress: Goal set today Pt will Perform Home Exercise Program: with min assist PT Goal: Perform Home Exercise Program - Progress: Goal set today  PT Evaluation Precautions/Restrictions  Precautions Precautions: Fall Required Braces or Orthoses: Yes Knee Immobilizer: On except when in CPM Restrictions Weight Bearing Restrictions: Yes LLE Weight Bearing: Weight bearing as tolerated Prior Functioning  Home Living Lives With: Spouse Receives Help From: Family Type of Home: House Home Layout: One level Home Access: Stairs to enter Entrance Stairs-Rails: Right;Left;Can reach both Secretary/administrator of Steps: 3 Bathroom Toilet: Standard Bathroom Accessibility: Yes How Accessible: Accessible via walker Home Adaptive Equipment: Bedside commode/3-in-1;Walker - rolling Prior Function Level of Independence: Independent with basic ADLs;Independent with homemaking with ambulation;Independent with gait;Independent with transfers Driving: Yes Vocation: Retired Producer, television/film/video: Awake/alert Overall Cognitive Status: Appears within functional limits for tasks assessed Orientation Level: Oriented X4 Sensation/Coordination Sensation Light Touch: Appears Intact Stereognosis: Not tested Hot/Cold: Not tested Proprioception: Appears Intact Additional Comments: mildly hypersensitive to light touch left LE Coordination Gross Motor Movements are Fluid and Coordinated: Yes Fine Motor Movements are Fluid and Coordinated: Yes Extremity Assessment RLE Assessment RLE Assessment: Within Functional Limits (AROM WNL, strength grossly 4/5) LLE Assessment LLE Assessment: Exceptions to [facility] (PROM limited ~25% at knee, strength 2/5 in available ROM) Mobility (including Balance) Bed Mobility Bed Mobility: Yes Supine to Sit: 3: Mod assist;With rails;HOB elevated (Comment degrees) (35 degrees) Supine to Sit Details (indicate cue type and  reason): focus on sequencing, assist for left LE management. Patient c/o significnatly increased pain with mobility but motivated to participate. Rest breaks prn. Sitting - Scoot to [place] of Bed: 4: Min assist (with bilat UE assist) Sitting - Scoot to Edge of Bed Details (indicate cue type and reason): guarded posture resulting in decreased smoothness of lateral weight shifts with mild right lean away from painful left side Transfers Transfers: Yes Sit to Stand: 3: Mod assist;With upper extremity assist;From elevated surface;From bed Sit to Stand Details (indicate cue type and reason): focus on left LE position and UE placement to increase functional independence and control pain, weight shifted right Stand to Sit: 3: Mod assist;With upper extremity assist;To chair/3-in-1 Stand to Sit Details: cues for sequencing, left LE position, and reaching back with right UE to shift weight right for increased functional independence and pain control Ambulation/Gait Ambulation/Gait: Yes Ambulation/Gait Assistance: 4: Min assist Ambulation/Gait Assistance Details (indicate cue type and reason): focus on decreased left step length and increased right step length for more symmetrical and typical gait pattern, verbal cues for upright posture and sequencing of RW Ambulation Distance (Feet): 45 Feet Assistive device: Rolling walker;Other (Comment) (left KI) Gait Pattern: Step-to pattern;Decreased step length - right;Decreased stance time - left;Trunk flexed;Shuffle Gait velocity: significantly decreased compared to baseline per patient report Stairs: No Wheelchair Mobility Wheelchair Mobility: No  Posture/Postural Control Posture/Postural Control: No significant limitations Balance Balance Assessed: Yes Static Sitting Balance Static Sitting - Balance Support: Bilateral upper extremity supported;Feet supported Static Sitting - Level of Assistance: 6: Modified independent (Device/Increase time) Dynamic Sitting  Balance Dynamic Sitting - Balance Support: Right upper extremity supported;Feet supported;During functional activity Dynamic Sitting - Level of Assistance: 4: Min assist Static Standing Balance Static Standing - Balance  Support: Bilateral upper extremity supported Static Standing - Level of Assistance: 4: Min assist Dynamic Standing Balance Dynamic Standing - Balance Support: Bilateral upper extremity supported;During functional activity Dynamic Standing - Level of Assistance: 4: Min assist Exercise  In room HEP of quad sets, glute sets, and ankle pumps given to be performed every hour to patient pain tolerance for increased functional left knee ROM and strength. Patient verbalized and demonstrated understanding without PT assist. Total Joint Exercises Ankle Circles/Pumps: AROM;Both;15 reps;Seated Quad Sets: AROM;Left;5 reps;Seated (in recliner ) Gluteal Sets: AROM;Both;10 reps;Seated End of Session PT - End of Session Equipment Utilized During Treatment: Gait belt;Left knee immobilizer;Other (comment) (RW) Activity Tolerance: Patient limited by pain Patient left: in chair;with call bell in reach;with bed alarm set Nurse Communication: Mobility status for transfers;Mobility status for ambulation General Behavior During Session: [facility] for tasks performed Cognition: [facility] for tasks performed  Romeo Rabon 11/21/2011, 10:53 AM

## 2011-11-22 LAB — PROTIME-INR
INR: 2.05 — ABNORMAL HIGH (ref 0.00–1.49)
Prothrombin Time: 23.5 seconds — ABNORMAL HIGH (ref 11.6–15.2)

## 2011-11-22 LAB — CBC
HCT: 35.3 % — ABNORMAL LOW (ref 39.0–52.0)
MCHC: 34.8 g/dL (ref 30.0–36.0)
Platelets: 208 10*3/uL (ref 150–400)
RDW: 13.3 % (ref 11.5–15.5)
WBC: 9 10*3/uL (ref 4.0–10.5)

## 2011-11-22 MED ORDER — WARFARIN SODIUM 2 MG PO TABS
2.0000 mg | ORAL_TABLET | Freq: Once | ORAL | Status: DC
  Filled 2011-11-22: qty 1

## 2011-11-22 MED ORDER — HYDROCODONE-ACETAMINOPHEN 10-325 MG PO TABS: 1.0000 | ORAL_TABLET | Freq: Four times a day (QID) | ORAL | Status: DC | PRN

## 2011-11-22 NOTE — Progress Notes (Signed)
UR COMPLETED  

## 2011-11-22 NOTE — Progress Notes (Signed)
ANTICOAGULATION CONSULT NOTE - Follow-Up Consult  Pharmacy Consult for Coumadin Indication: VTE prophylaxis s/p L TKA  Allergies  Allergen Reactions  . Codeine Nausea Only    Patient Measurements: Wt = 81 kg  Vital Signs: Temp: 99.8 F (37.7 C) (03/22 0622) BP: 134/82 mmHg (03/22 0622) Pulse Rate: 101  (03/22 0622)  Labs:  Alvira Philips 11/22/11 0619 11/21/11 1729 11/21/11 0710 11/20/11 1841  HGB 12.3* 12.6* -- --  HCT 35.3* 35.4* 34.5* --  PLT 208 205 203 --  APTT -- -- -- --  LABPROT 23.5* -- 14.1 13.6  INR 2.05* -- 1.07 1.02  HEPARINUNFRC -- -- -- --  CREATININE -- 0.66 0.74 --  CKTOTAL -- -- -- --  CKMB -- -- -- --  TROPONINI -- -- -- --   Estimated Creatinine Clearance: 106.8 ml/min (by C-G formula based on Cr of 0.66).  Assessment: 61 year old s/p L TKA on Coumadin for VTE prophylaxis. INR with dramatic increase. No bleeding noted.   Goal of Therapy:  INR 2-3   Plan:  1) Coumadin 2 mg po x 1 dose today 2) Daily INR 3)  D/C lovenox  Talbert Cage, PharmD Clinical pharmacist, pager [phone] 11/22/2011,10:21 AM

## 2011-11-22 NOTE — Progress Notes (Signed)
Physical Therapy Treatment Patient Details Name: Shawn [name] MRN: [id number] DOB: [birth year] Today's Date: 11/22/2011  PT Assessment/Plan  PT - Assessment/Plan Comments on Treatment Session: Pt continues to be significantly limited in LLE strength, ROM, and mobility due to pain, and continues to require cues and physical assist for safety. Discussed recommendation for short SNF stay at DC to decrease falls risk and improve outcomes. Pt and wife willing to explore this option.  PT Plan: Discharge plan needs to be updated PT Frequency: 7X/week Follow Up Recommendations: Skilled nursing facility;Supervision/Assistance - 24 hour Equipment Recommended: Defer to next venue PT Goals  Acute Rehab PT Goals PT Goal: Sit to Supine/Side - Progress: Progressing toward goal PT Goal: Sit to Stand - Progress: Progressing toward goal PT Goal: Stand to Sit - Progress: Progressing toward goal PT Transfer Goal: Bed to Chair/Chair to Bed - Progress: Progressing toward goal PT Goal: Ambulate - Progress: Progressing toward goal PT Goal: Perform Home Exercise Program - Progress: Progressing toward goal  PT Treatment Precautions/Restrictions  Precautions Precautions: Fall Required Braces or Orthoses: Yes Knee Immobilizer: On except when in CPM Restrictions Weight Bearing Restrictions: Yes LLE Weight Bearing: Weight bearing as tolerated Mobility (including Balance) Bed Mobility Sit to Supine: 3: Mod assist;HOB flat;With rail Sit to Supine - Details (indicate cue type and reason): Pt unable to lift LE into bed, and required step-by-step cues for sequencing and hand placement Scooting to [facility]: 3: Mod assist Scooting to [facility] Details (indicate cue type and reason): Cues for technique and sequence.  Transfers Sit to Stand: 3: Mod assist;With upper extremity assist;From chair/3-in-1;With armrests Sit to Stand Details (indicate cue type and reason): Lifting assist needed. Cues for safety, hand placement, and  LE placement as pt unsureof which LE needs to be extended. Tries to stand withboth LEs extended, and then proceeds to cross RLE under LLE.  Stand to Sit: 4: Min assist;With upper extremity assist;To bed Stand to Sit Details: Cues for safe hand placement and safety as pt tends to lean, with risk of sliding off bed.  Ambulation/Gait Ambulation/Gait Assistance: 4: Min assist Ambulation/Gait Assistance Details (indicate cue type and reason): Cues for gait pattern, RW management, not pivoting on LLE to turn, decreasing L step length, as well as increasing distance from RW to self. Pt basically unable to adjust gait pattern with repeated cues.  Ambulation Distance (Feet): 50 Feet Assistive device: Rolling walker Gait Pattern: Step-to pattern;Decreased stride length;Decreased weight shift to left;Decreased stance time - left;Decreased step length - right Stairs: No  Static Sitting Balance Static Sitting - Balance Support: Bilateral upper extremity supported;Feet supported Static Sitting - Level of Assistance: Other (comment) (Pt with significant R lean due to pain in sitting EOB) Exercise  Total Joint Exercises Ankle Circles/Pumps: AROM;Both;15 reps;Seated Quad Sets: Seated;10 reps;Left;Strengthening;AROM (LEs elevated in recliner) Short Arc Quad: Seated;10 reps;Left;Strengthening;AAROM (LEs elevated in recliner) Heel Slides: Seated;10 reps;Left;Strengthening;AAROM (LEs elevated in recliner) Straight Leg Raises: Seated;10 reps;Left;Strengthening;AAROM (LEs elevated in recliner) Knee Flexion: Seated;10 reps;Left;AAROM ~ 0-30degrees  End of Session PT - End of Session Equipment Utilized During Treatment: Gait belt;Left knee immobilizer Activity Tolerance: Patient limited by pain Patient left: in bed;in CPM;with call bell in reach;with family/visitor present Nurse Communication: Mobility status for transfers;Mobility status for ambulation;Other (comment) (DC recommendations) General Behavior  During Session: [facility] for tasks performed Cognition: Impaired (Decreased safety awareness and problem solving)  Virl Cagey, PT [phone] 11/22/2011, 1:54 PM

## 2011-11-22 NOTE — Consult Note (Signed)
Clinical Social Work Department BRIEF PSYCHOSOCIAL ASSESSMENT 11/22/2011  Patient:  Shawn [name], Shawn [name]     Account Number:  [id number]     Admit date:  11/20/2011  Clinical Social Worker:  Thomasene Mohair  Date/Time:  11/22/2011 02:00 PM  Referred by:  RN  Date Referred:  11/22/2011 Referred for  SNF Placement   Other Referral:   Interview type:  Patient Other interview type:   Wife at bedside    PSYCHOSOCIAL DATA Living Status:  WIFE Admitted from facility:   Level of care:   Primary support name:  Shawn [name] Primary support relationship to patient:  SPOUSE Degree of support available:   adequate    CURRENT CONCERNS Current Concerns  Post-Acute Placement  Adjustment to Illness   Other Concerns:    SOCIAL WORK ASSESSMENT / PLAN CSW was referred to Pt d/t concern with dc plan home vs SNF. Pt was noted to struggle more during PT/OT sessions today causing some concern with adjustment to injury once home. Pt's wife at bedside during CSW visit. Pt declines SNF placement based on his belief that he will be "fine" at home with the assistance of his family and him going to outpt therapy. Pt does have home health set up at this time. Pt also aware that his insurance might not approve him for SNF if he could possibly be successful at home. Pt's wife agreeable to providing assistance to patient along with other familiy members who live nearby. CSW updated nursing and other clinical staff with the challenges in placing pt d/t insurance approval and also that Pt is refusing placement at this time.  CSW will f/u as needed while pt is in the hospital.   Assessment/plan status:  Psychosocial Support/Ongoing Assessment of Needs Other assessment/ plan:   Information/referral to community resources:    PATIENT'S/FAMILY'S RESPONSE TO PLAN OF CARE: Pt refusing snf at this time.  Pt prefers home health or outpt therapy program. Pt's family supportive of pt's decision.    Frederico Hamman, LCSW [phone]  covering for Bonnye Fava, LCSW

## 2011-11-22 NOTE — Progress Notes (Signed)
Orthopedic Tech Progress Note Patient Details:  KURON DOCKEN [birth year] [id number]  Patient ID: [name], male   DOB: 06-22-1951, 61 y.o.   MRN: [id number] Confirmed patient has knee immobilizer.  Leo Grosser T 11/22/2011, 12:51 PM

## 2011-11-22 NOTE — Progress Notes (Signed)
Subjective: Pain controlled.  rn states patient had some confusion last night which is resolved.    Objective: Vital signs in last 24 hours: Temp:  [99.8 F (37.7 C)-102.8 F (39.3 C)] 99.8 F (37.7 C) (03/22 0622) Pulse Rate:  [101-129] 101  (03/22 0622) Resp:  [18] 18  (03/22 0622) BP: (134-135)/(66-82) 134/82 mmHg (03/22 0622) SpO2:  [94 %-95 %] 95 % (03/22 0622)  Intake/Output from previous day: 03/21 0701 - 03/22 0700 In: 1080 [I.V.:1080] Out: 2225 [Urine:2050; Drains:175] Intake/Output this shift:     Basename 11/22/11 0619 11/21/11 1729 11/21/11 0710  HGB 12.3* 12.6* 12.1*    Basename 11/22/11 0619 11/21/11 1729  WBC 9.0 8.9  RBC 3.98* 4.03*  HCT 35.3* 35.4*  PLT 208 205    Basename 11/21/11 1729 11/21/11 0710  NA 133* 139  K 3.6 3.8  CL 99 104  CO2 24 26  BUN 7 9  CREATININE 0.66 0.74  GLUCOSE 137* 120*  CALCIUM 8.5 8.5    Basename 11/22/11 0619 11/21/11 0710  LABPT -- --  INR 2.05* 1.07    Wound looks good.  Staples intact.  No drainage or signs of infection.  hemovac removed.   Assessment/Plan: Possible d/c home today or Saturday.  Will decrease percocet strength.  Patient feeling better this morning.     Jerie Basford M 11/22/2011, 8:35 AM

## 2011-11-22 NOTE — Progress Notes (Signed)
Physical Therapy Treatment Patient Details Name: Shawn [name] MRN: [id number] DOB: October 29, 1950 Today's Date: 11/22/2011  PT Assessment/Plan  PT - Assessment/Plan Comments on Treatment Session: Pt unable to perform SLR independently.  Pt mobility and safety awareness much improve.  RN attributes prior confusion to pain medication which pt has had since this morning.  Pt demonstrate safety with mobility and should be safe for discharge home when cleared by MD.  Pt and spouse instructed in use of Leg lifter for Lt LE management until pt able to lift LE independently.  Instructed pt and spouse in proper operation of CPM machine emphasis on minimizing pain.  Reviewed safety precautions with pt and spouse.  Pt plans to discharge home today with HHPT follow-up tomorrow.  PT Plan: Discharge plan needs to be updated PT Frequency: 7X/week Follow Up Recommendations: Home health PT Equipment Recommended: Rolling walker with 5" wheels;3 in 1 bedside comode PT Goals  Acute Rehab PT Goals PT Goal Formulation: With patient Time For Goal Achievement: 7 days Pt will go Supine/Side to Sit: with modified independence;with HOB 0 degrees PT Goal: Supine/Side to Sit - Progress: Met Pt will go Sit to Supine/Side: with modified independence;with HOB 0 degrees PT Goal: Sit to Supine/Side - Progress: Met Pt will go Sit to Stand: with supervision;with upper extremity assist PT Goal: Sit to Stand - Progress: Met Pt will go Stand to Sit: with supervision;with upper extremity assist PT Goal: Stand to Sit - Progress: Met Pt will Transfer Bed to Chair/Chair to Bed: with supervision PT Transfer Goal: Bed to Chair/Chair to Bed - Progress: Not met Pt will Ambulate: 51 - 150 feet;with supervision;with least restrictive assistive device PT Goal: Ambulate - Progress: Met Pt will Go Up / Down Stairs: with rail(s);with supervision;1-2 stairs PT Goal: Up/Down Stairs - Progress: Updated due to goal met PT Goal: Perform Home  Exercise Program - Progress: Progressing toward goal  PT Treatment Precautions/Restrictions  Precautions Precautions: Fall Required Braces or Orthoses: Yes Knee Immobilizer: On except when in CPM Restrictions Weight Bearing Restrictions: Yes LLE Weight Bearing: Weight bearing as tolerated Mobility (including Balance) Bed Mobility Bed Mobility: Yes Supine to Sit: 5: Supervision;HOB flat Supine to Sit Details (indicate cue type and reason): Instructed pt in use of Leg lifter to manage Lt LE.   Sitting - Scoot to Edge of Bed: 5: Supervision Sitting - Scoot to [place] of Bed Details (indicate cue type and reason): Pt appeared to be too far forward on the EOB but once I lifted the pt's gown to see where his hips where I realized pt was in a safe position.   Sit to Supine: 6: Modified independent (Device/Increase time);HOB flat Sit to Supine - Details (indicate cue type and reason): Pt using leg lifter to manage Lt LE.   Scooting to [facility]: Not tested (comment) Scooting to [facility] Details (indicate cue type and reason): Cues for technique and sequence.  Transfers Transfers: Yes Sit to Stand: 5: Supervision;From bed (3 trials) Sit to Stand Details (indicate cue type and reason): Instructed pt in proper hand placement and technique first trial. Pt able to verbaliize and demonstrate proper technique without prompting next two trials.  Stand to Sit: 5: Supervision;To bed;With upper extremity assist Stand to Sit Details: Instructed pt in proper hand placement and technique first trial. Pt able to verbaliize and demonstrate proper technique without prompting next two trials.  Ambulation/Gait Ambulation/Gait: Yes Ambulation/Gait Assistance: 5: Supervision Ambulation/Gait Assistance Details (indicate cue type and reason): Verbal and visual cues  for gait sequencing including safe distancing from RW.   Pt is impulsive but receptive to feed back.  Pt was able to make adjustment to gait to prevent being too far  forward in walker.  Ambulation Distance (Feet): 100 Feet Assistive device: Rolling walker Gait Pattern: Step-to pattern;Decreased stride length;Decreased weight shift to left;Decreased stance time - left;Decreased step length - right (KI on left LE) Gait velocity: WFL Stairs: Yes Stairs Assistance: 5: Supervision Stairs Assistance Details (indicate cue type and reason): Verbal cues for sequencing.  Stair Management Technique: Two rails Number of Stairs: 2  Height of Stairs: 8  Wheelchair Mobility Wheelchair Mobility: No  Posture/Postural Control Posture/Postural Control: No significant limitations Balance Balance Assessed: Yes Static Sitting Balance Static Sitting - Balance Support: No upper extremity supported;Feet supported Static Sitting - Level of Assistance: 5: Stand by assistance (pt continues to lean to Rt ) Dynamic Sitting Balance Dynamic Sitting - Level of Assistance: Not tested (comment) Static Standing Balance Static Standing - Level of Assistance: Not tested (comment) Exercise  Total Joint Exercises Ankle Circles/Pumps: AROM;Both;15 reps;Seated Quad Sets: Seated;10 reps;Left;Strengthening;AROM (LEs elevated in recliner) Short Arc Quad: Seated;10 reps;Left;Strengthening;AAROM (LEs elevated in recliner) Heel Slides: Seated;10 reps;Left;Strengthening;AAROM (LEs elevated in recliner) Straight Leg Raises: Seated;10 reps;Left;Strengthening;AAROM (LEs elevated in recliner) Knee Flexion: Seated;10 reps;Left;AAROM End of Session PT - End of Session Equipment Utilized During Treatment: Gait belt;Left knee immobilizer Activity Tolerance: Patient tolerated treatment well Patient left: in bed;with call bell in reach;with family/visitor present Nurse Communication: Mobility status for transfers;Mobility status for ambulation (RN observed entire session for neuro eval. ) General Behavior During Session: [facility] for tasks performed Cognition: [facility] for tasks  performed  Tykeisha Peer 11/22/2011, 5:02 PM Cloria Ciresi L. Kordelia Severin DPT [phone]

## 2011-11-22 NOTE — Evaluation (Signed)
Occupational Therapy Evaluation Patient Details Name: Shawn [name] MRN: [id number] DOB: 06-25-1951 Today's Date: 11/22/2011  Problem List:  Patient Active Problem List  Diagnoses  . Erectile dysfunction  . Osteoarthritis of left knee  . Elevated blood pressure reading without diagnosis of hypertension  . History of depression  . Tobacco dependence  . Prostate cancer screening  . Health maintenance examination  . Hypoxia    Past Medical History:  Past Medical History  Diagnosis Date  . Osteoarthritis X years    left knee.  Distant hx (age 31) "dislocated" knee and required surgery, then crush injury to patella required knee cap removal.  . Diverticular disease     "itis" x 2 episodes (last was about 2007)  . Tobacco dependence   . Multiple rib fractures 10/09/10    s/p fall down a ravine--9th and 10th on right, contusion of left 7th and 8th ribs  . Fracture of metatarsal bone(s), closed     3rd, 4th, 5th mid shaft on left foot  . History of substance abuse     cocaine, marijuana, acid, alcohol (in remission since 2010)  . Solar dermatitis     face and ears  . Bipolar disorder     Has had multiple psych hospitalization in the past, most recently at [facility] 01/31/11-02/04/11.  Says meds made him emotionally blunted. (lamictal and wellbutrin).  . Alcoholism     "Dry" since 2002  . Depression    Past Surgical History:  Past Surgical History  Procedure Date  . Knee dislocation surgery 1966  . Patellectomy 1968    s/p crush injury  . Inguinal hernia repair     left  . Hernia repair     at 61 years of age  62  . Total knee arthroplasty 11/20/2011    Procedure: TOTAL KNEE ARTHROPLASTY;  Surgeon: Loreta Ave, MD;  Location: [facility] OR;  Service: Orthopedics;  Laterality: Left;  DR Eulah Pont WANTS FOR THIS CASE    OT Assessment/Plan/Recommendation OT Assessment:  Pt. Presents to OT s/p TKA.  Pt. Demonstrates impaired safety awareness and judgement, impaired  problem solving with novel tasks, impulsivity (all of which appear to be pt. Baseline) coupled with pain and pain medications, t place pt. At very high risk for falls.  Pt's wife with decreased understanding of level of assistance pt. Requires, and states she can handle pt. At home; however, wife does not seem to have the physical capabilities to handle pt.   Pt.  Will need 24 hour physical assistance (min-maximal assistance) if he discharges home in next 24 hours; therefore, recommend SNF.  Pt. Will benefit from OT to maximize safety and independence with BADLs to allow pt. To return to supervision level with BADLs after SNF level rehab. OT Recommendation/Assessment: Patient will need skilled OT in the acute care venue OT Problem List: Decreased strength;Decreased activity tolerance;Impaired balance (sitting and/or standing);Decreased cognition;Decreased safety awareness;Decreased knowledge of use of DME or AE;Pain (functional cognition likely baseline) Barriers to Discharge: Decreased caregiver support OT Therapy Diagnosis : Generalized weakness;Cognitive deficits;Acute pain (cognition likely pt. baseline) OT Plan OT Frequency: Min 2X/week OT Treatment/Interventions: Self-care/ADL training;DME and/or AE instruction;Therapeutic activities;Patient/family education;Balance training OT Recommendation Follow Up Recommendations: Skilled nursing facility;Supervision/Assistance - 24 hour Equipment Recommended: Rolling walker with 5" wheels;3 in 1 bedside comode Individuals Consulted Consulted and Agree with Results and Recommendations: Family member/caregiver OT Goals Acute Rehab OT Goals OT Goal Formulation: With patient Time For Goal Achievement: 7 days ADL Goals  Pt Will Perform Grooming: Standing at sink (min guard assist) ADL Goal: Grooming - Progress: Goal set today Pt Will Perform Lower Body Bathing: with min assist;Sit to stand from chair ADL Goal: Lower Body Bathing - Progress: Goal set  today Pt Will Perform Lower Body Dressing: with min assist;Sit to stand from chair ADL Goal: Lower Body Dressing - Progress: Goal set today Pt Will Transfer to Toilet: Ambulation;3-in-1 (min guard assist) ADL Goal: Toilet Transfer - Progress: Goal set today Pt Will Perform Toileting - Hygiene: with supervision;Sitting on 3-in-1 or toilet ADL Goal: Toileting - Hygiene - Progress: Goal set today Pt Will Perform Tub/Shower Transfer: with min assist;Ambulation;Shower transfer ADL Goal: Web designer - Progress: Goal set today Additional ADL Goal #1: Pt. will require no more than 2 cues for safety during ADL session ADL Goal: Additional Goal #1 - Progress: Goal set today  OT Evaluation Precautions/Restrictions  Precautions Precautions: Fall Required Braces or Orthoses: Yes Knee Immobilizer: On except when in CPM Restrictions Weight Bearing Restrictions: Yes LLE Weight Bearing: Weight bearing as tolerated Prior Functioning Home Living Lives With: Spouse Receives Help From: Family (Dtr next door) Type of Home: House Home Layout: One level Home Access: Stairs to enter Entrance Stairs-Rails: Right;Left;Can reach both Secretary/administrator of Steps: 3 Bathroom Shower/Tub: Health visitor: Standard Bathroom Accessibility: Yes How Accessible: Accessible via walker Home Adaptive Equipment: Bedside commode/3-in-1;Walker - rolling Prior Function Level of Independence: Independent with basic ADLs;Independent with homemaking with ambulation;Independent with gait;Independent with transfers Able to Take Stairs?: Yes Vocation: Retired ADL ADL Eating/Feeding: Simulated;Independent Where Assessed - Eating/Feeding: Chair Grooming: Performed;Wash/dry hands;Wash/dry face;Minimal assistance (mod verbal cues for walker safety and positioning) Grooming Details (indicate cue type and reason): Pt. attempts to leave walker in front then turn without walker to approach sink Where  Assessed - Grooming: Standing at sink Upper Body Bathing: Simulated;Set up Where Assessed - Upper Body Bathing: Sitting, chair Lower Body Bathing: Simulated;Maximal assistance Where Assessed - Lower Body Bathing: Sit to stand from chair Where Assessed - Upper Body Dressing: Sitting, chair Lower Body Dressing: Simulated;Maximal assistance Where Assessed - Lower Body Dressing: Sit to stand from chair Toilet Transfer: Simulated;Moderate assistance Toilet Transfer Details (indicate cue type and reason): Pt. ambulated to BR in attempts to practice toilet transfer.  Once in BR, pt. leaned forehead against door - pt. indicating fatigue and increased pain, so pt. returned to chair.  Pt. required min-mod A to ambulate to/from BR.  As pt. fatigued, he would flex his trunk, lose his sequence, and lose balance anteriorly Toilet Transfer Method: Ambulating Toilet Transfer Equipment: Raised toilet seat with arms (or 3-in-1 over toilet) Toileting - Clothing Manipulation: Simulated;Maximal assistance Where Assessed - Toileting Clothing Manipulation: Standing Equipment Used: Rolling walker Ambulation Related to ADLs: See toileting comments.  Pt. required mod verbal cues for walker sequence, to turn fully and back up to chair before sitting.  Pt. initially attempted to sit in recliner when he was ~10" from chair ADL Comments: Pt. in bed, eager to get up.  Pt. with possibillity of D/C home today so HOB lowered and pt. instructed not to use trapeze.  Pt. unable to recall/demonstrate correct method for bed mobilty.   Pt. repetetively made same errors without attempt to correct.  Pt. also noted to be impulsive.  Pt. required step by step instruction coupled with max A to move supine to EOB.  Once pt. approached the EOB, he scooted too far to the right and slid right foot too far  under him causing him to almost slide off the bed.  Pt. assisted back into the bed with max A.  On the second attempt to the EOB, pt. able to  move to the EOB and achieve a sitting position, but impulsively scooted self too far forward with Rt. foot too far under him, once again requiring max A to prevent pt. From sliding off EOB.  Pt. Required mod verbal cues for safety throughout session, with poor awareness of errors.   Vision/Perception  Perception Perception: Impaired (Pt. appears to have decreased awareness of position in space) Praxis Praxis: Intact Cognition Cognition Arousal/Alertness: Awake/alert Overall Cognitive Status: Impaired Orientation Level: Oriented X4 Safety/Judgement: Decreased awareness of safety precautions;Decreased safety judgement for tasks assessed Safety/Judgement - Other Comments: Pt. very impulsive - likely pt. baseline coupled with pain meds Awareness of Errors: Decreased awareness of errors made Decreased Awareness of Errors: Assistance required to correct errors made;Assistance required to identify errors made Awareness of Errors - Other Comments: Pt. makes same errors repetitively.  Wife present and did not seem too concerned re: pt. difficulties.  Anticipate this is pt's baseline coupled with pain meds, and anxiety (pt. does acknowledge being anxious) Problem Solving: Requires assistance for problem solving Sensation/Coordination Coordination Gross Motor Movements are Fluid and Coordinated: Yes Fine Motor Movements are Fluid and Coordinated: Yes Extremity Assessment RUE Assessment RUE Assessment: Within Functional Limits LUE Assessment LUE Assessment: Within Functional Limits Mobility  Bed Mobility Bed Mobility: Yes Supine to Sit: 2: Max assist;HOB flat;With rails (mod verbal cues - see ADL comments) Sitting - Scoot to Edge of Bed: 4: Min assist (max A to prevent sliding off EOB) Sit to Supine: 3: Mod assist;HOB flat;With rail Sit to Supine - Details (indicate cue type and reason): Pt unable to lift LE into bed, and required step-by-step cues for sequencing and hand placement Scooting to  [facility]: 3: Mod assist Scooting to [facility] Details (indicate cue type and reason): Cues for technique and sequence.  Transfers Transfers: Yes Sit to Stand: 2: Max assist;From bed (Required 3 attempts before being successful) Sit to Stand Details (indicate cue type and reason): Lifting assist needed. Cues for safety, hand placement, and LE placement as pt unsureof which LE needs to be extended. Tries to stand withboth LEs extended, and then proceeds to cross RLE under LLE.  Stand to Sit: 3: Mod assist;With upper extremity assist Stand to Sit Details: Cues for safe hand placement and safety as pt tends to lean, with risk of sliding off bed.  End of Session OT - End of Session Activity Tolerance: Patient limited by fatigue;Patient limited by pain Patient left: in chair;with call bell in reach;with family/visitor present Nurse Communication: Mobility status for ambulation;Mobility status for transfers (Spoke with PA in afternoon regarding safety issues and SNF) General Behavior During Session: Restless Cognition: Impaired, at baseline   Shawn [name] M 11/22/2011, 3:39 PM

## 2011-11-25 NOTE — Discharge Summary (Signed)
  ABBREVIATED DISCHARGE SUMMARY      DATE OF HOSPITALIZATION:   20 November 2011   REASON FOR HOSPITALIZATION:     61 y.o. Wm with hx end stage left knee djd, pain.     SIGNIFICANT FINDINGS:  DJD  OPERATION:  Left total knee replacement  FINAL DIAGNOSIS:  same  SECONDARY DIAGNOSIS: none  CONSULTANTS:  none  DISCHARGE CONDITION:  STABLE  DISCHARGED TO:  HOME

## 2012-02-04 ENCOUNTER — Ambulatory Visit (INDEPENDENT_AMBULATORY_CARE_PROVIDER_SITE_OTHER): Payer: PRIVATE HEALTH INSURANCE | Admitting: Family Medicine

## 2012-02-04 ENCOUNTER — Encounter: Payer: Self-pay | Admitting: Family Medicine

## 2012-02-04 VITALS — BP 127/83 | HR 73 | Ht 73.5 in | Wt 177.0 lb

## 2012-02-04 DIAGNOSIS — F3161 Bipolar disorder, current episode mixed, mild: Secondary | ICD-10-CM | POA: Insufficient documentation

## 2012-02-04 DIAGNOSIS — Z79899 Other long term (current) drug therapy: Secondary | ICD-10-CM

## 2012-02-04 LAB — CBC WITH DIFFERENTIAL/PLATELET
Basophils Absolute: 0 10*3/uL (ref 0.0–0.1)
Hemoglobin: 14.7 g/dL (ref 13.0–17.0)
Lymphocytes Relative: 16.4 % (ref 12.0–46.0)
Monocytes Relative: 6.8 % (ref 3.0–12.0)
Neutro Abs: 4.4 10*3/uL (ref 1.4–7.7)
Neutrophils Relative %: 74.3 % (ref 43.0–77.0)
RDW: 14.8 % — ABNORMAL HIGH (ref 11.5–14.6)

## 2012-02-04 LAB — COMPREHENSIVE METABOLIC PANEL
ALT: 39 U/L (ref 0–53)
Albumin: 3.7 g/dL (ref 3.5–5.2)
CO2: 30 mEq/L (ref 19–32)
Calcium: 9.1 mg/dL (ref 8.4–10.5)
Chloride: 106 mEq/L (ref 96–112)
GFR: 120.93 mL/min (ref >60.00)
Potassium: 3.5 mEq/L (ref 3.5–5.1)
Sodium: 143 mEq/L (ref 135–145)
Total Protein: 6.9 g/dL (ref 6.0–8.3)

## 2012-02-04 MED ORDER — BUPROPION HCL ER (SR) 150 MG PO TB12: 150.0000 mg | ORAL_TABLET | Freq: Two times a day (BID) | ORAL | Status: DC

## 2012-02-04 MED ORDER — DIVALPROEX SODIUM 500 MG PO DR TAB: DELAYED_RELEASE_TABLET | ORAL | Status: DC

## 2012-02-04 NOTE — Patient Instructions (Signed)
Do not take your depakote the night before your next appt with me.

## 2012-02-05 NOTE — Assessment & Plan Note (Signed)
Extensive psych history, and per his own admission he has been through a lot of meds but he cannot recall any specific names to give [place] an idea of what to avoid, etc. I did tell him that keeping him on wellbutrin alone is not a good idea, and I will add depakote 500 mg bid today and check CMET and CBC today. Refer to psychiatrist: Dr. Dayton Scrape is the one he saw in the distant past and he requests referral back to him. F/u in office in 10d, will check depakote trough at that time.

## 2012-02-05 NOTE — Progress Notes (Signed)
OFFICE VISIT  02/05/2012   CC:  Chief Complaint  Patient presents with  . Insomnia    trouble staying asleep, agitiated with people, nerves "shot to hell"     HPI:    Patient is a 61 y.o. Caucasian male who presents with his wife today for general irritability and poor sleep. Has long history of bipolar disorder, both he and his wife feel like he has lapsed into an episode of this currently.  For the last several weeks at least he notes racing thoughts, irritability, mood swings, some periods of talking excessively and some periods of being very quiet.  At night he can't get to sleep b/c of the racing thoughts.  Denies RLS.  Still having some mild/mod left knee pain when shifting positions in bed so this plays some role in his insomnia (he is s/p left knee surgery by Dr. Thurston Hole and just finished rehab this week).  He takes about 4 Norco 7.5/325 per day for pain. He used to be a problem drinker but has been dry for a long time.  However, for unclear reasons he has begun drinking a few ounces of red wine every other night. Denies suicidal or homicidal thoughts.  No excessive euphoria or energy, no hallucinations, no paranoia, no trouble with the law, no risk-taking behavior.  Past Medical History  Diagnosis Date  . Osteoarthritis X years    left knee.  Distant hx (age 45) "dislocated" knee and required surgery, then crush injury to patella required knee cap removal.  . Diverticular disease     "itis" x 2 episodes (last was about 2007)  . Tobacco dependence   . Multiple rib fractures 10/09/10    s/p fall down a ravine--9th and 10th on right, contusion of left 7th and 8th ribs  . Fracture of metatarsal bone(s), closed     3rd, 4th, 5th mid shaft on left foot  . History of substance abuse     cocaine, marijuana, acid, alcohol (in remission since 2010)  . Solar dermatitis     face and ears  . Bipolar disorder     Has had multiple psych hospitalization in the past, most recently at [facility] center 01/31/11-02/04/11.  Says meds made him emotionally blunted. (lamictal and wellbutrin).  . Alcoholism     "Dry" since 2002  . Depression     Past Surgical History  Procedure Date  . Knee dislocation surgery 1966  . Patellectomy 1968    s/p crush injury  . Inguinal hernia repair     left  . Hernia repair     at 61 years of age  44  . Total knee arthroplasty 11/20/2011    Procedure: TOTAL KNEE ARTHROPLASTY;  Surgeon: Loreta Ave, MD;  Location: [facility] OR;  Service: Orthopedics;  Laterality: Left;  DR Aaron Edelman FOR THIS CASE   Past surgical, social, and family history reviewed and no changes noted since last office visit.   Outpatient Prescriptions Prior to Visit  Medication Sig Dispense Refill  . buPROPion (WELLBUTRIN SR) 150 MG 12 hr tablet Take 150 mg by mouth 2 (two) times daily.      Norco 7.5/325, 1 qid prn pain (from Dr. Arlys John)  Allergies  Allergen Reactions  . Codeine Nausea Only    ROS As per HPI  PE: Blood pressure 127/83, pulse 73, height 6' 1.5" (1.867 m), weight 177 lb (80.287 kg). Gen: Alert, well appearing.  Patient is oriented to person, place, time, and situation.  Affect: calm, talkative but not pressured speech.  No overt anger or irritability.  Mildly depressed affect at times.  Thought and speech are lucid. Wt Readings from Last 2 Encounters:  02/04/12 177 lb (80.287 kg)  11/20/11 178 lb 9.6 oz (81.012 kg)   HEENT: PERRLA, EOMI.   Neck: no LAD, mass, or thyromegaly. CV: RRR, no m/r/g LUNGS: CTA bilat, nonlabored. NEURO: no tremor or tics noted on observation.  Coordination intact. CN 2-12 grossly intact bilaterally, strength 5/5 in all extremeties.  No ataxia.  LABS:  None here today  IMPRESSION AND PLAN:  Bipolar disorder, current episode mixed, mild Extensive psych history, and per his own admission he has been through a lot of meds but he cannot recall any specific names to give [place] an idea of what to avoid, etc. I  did tell him that keeping him on wellbutrin alone is not a good idea, and I will add depakote 500 mg bid today and check CMET and CBC today. Refer to psychiatrist: Dr. Dayton Scrape is the one he saw in the distant past and he requests referral back to him. F/u in office in 10d, will check depakote trough at that time.     FOLLOW UP: Return in about 10 days (around 02/14/2012) for f/u bipolar disorder and get depakote trough.

## 2012-02-14 ENCOUNTER — Ambulatory Visit: Payer: PRIVATE HEALTH INSURANCE | Admitting: Family Medicine

## 2012-03-04 ENCOUNTER — Other Ambulatory Visit: Payer: Self-pay | Admitting: *Deleted

## 2012-03-04 MED ORDER — DIVALPROEX SODIUM 500 MG PO DR TAB: DELAYED_RELEASE_TABLET | ORAL | Status: DC

## 2012-03-04 NOTE — Telephone Encounter (Signed)
Faxed refill request received from pharmacy for Divalproex Last filled by MD on 02/04/12 Last seen on 02/04/12, follow up in 10 days Follow up on 02/14/12 cancelled due to downed trees in yard.  Pt has not rescheduled. Please advise refill.

## 2012-03-04 NOTE — Telephone Encounter (Signed)
I RF'd a 30 day supply.  Pls have pt reschedule office f/u--morning appt so we can check a trough level of depakote.  Remind him NOT to take his depakote the morning of his o/v.--thx

## 2012-04-05 ENCOUNTER — Emergency Department (HOSPITAL_COMMUNITY)
Admission: EM | Admit: 2012-04-05 | Discharge: 2012-04-05 | Discharge status: Against Medical Advice | Payer: PRIVATE HEALTH INSURANCE | Attending: Emergency Medicine | Admitting: Emergency Medicine

## 2012-04-05 ENCOUNTER — Encounter (HOSPITAL_COMMUNITY): Payer: Self-pay | Admitting: Emergency Medicine

## 2012-04-05 DIAGNOSIS — Z0389 Encounter for observation for other suspected diseases and conditions ruled out: Secondary | ICD-10-CM | POA: Insufficient documentation

## 2012-04-05 NOTE — ED Notes (Signed)
Pt called x 1 and called outside with no answer

## 2012-04-05 NOTE — ED Notes (Signed)
Pt not in waiting room for second call.

## 2012-04-05 NOTE — ED Notes (Signed)
Pt tripped and fell yesterday and hit r lowr back. Nad. Smells of ETOH. Denies hitting head or LOC. Ambulation well with slight limp noted. deneis gu sx's.

## 2012-04-05 NOTE — ED Notes (Addendum)
Drinks seldom but has drank a lot in past 24hrs to help with pain. Aware of wait

## 2012-04-05 NOTE — ED Notes (Signed)
Called for pt x3. No answer.  

## 2012-04-05 NOTE — ED Notes (Signed)
Pt not in waiting room at this time.

## 2012-04-07 ENCOUNTER — Encounter (HOSPITAL_COMMUNITY): Payer: Self-pay

## 2012-04-07 ENCOUNTER — Emergency Department (HOSPITAL_COMMUNITY)
Admission: EM | Admit: 2012-04-07 | Discharge: 2012-04-07 | Disposition: A | Discharge status: Home / Self Care | Payer: PRIVATE HEALTH INSURANCE | Attending: Emergency Medicine | Admitting: Emergency Medicine

## 2012-04-07 ENCOUNTER — Emergency Department (HOSPITAL_COMMUNITY): Payer: PRIVATE HEALTH INSURANCE

## 2012-04-07 DIAGNOSIS — S20219A Contusion of unspecified front wall of thorax, initial encounter: Secondary | ICD-10-CM | POA: Insufficient documentation

## 2012-04-07 DIAGNOSIS — M533 Sacrococcygeal disorders, not elsewhere classified: Secondary | ICD-10-CM | POA: Insufficient documentation

## 2012-04-07 DIAGNOSIS — M25569 Pain in unspecified knee: Secondary | ICD-10-CM | POA: Insufficient documentation

## 2012-04-07 DIAGNOSIS — F1010 Alcohol abuse, uncomplicated: Secondary | ICD-10-CM | POA: Insufficient documentation

## 2012-04-07 DIAGNOSIS — T148XXA Other injury of unspecified body region, initial encounter: Secondary | ICD-10-CM

## 2012-04-07 DIAGNOSIS — W19XXXA Unspecified fall, initial encounter: Secondary | ICD-10-CM | POA: Insufficient documentation

## 2012-04-07 DIAGNOSIS — F17200 Nicotine dependence, unspecified, uncomplicated: Secondary | ICD-10-CM | POA: Insufficient documentation

## 2012-04-07 LAB — RAPID URINE DRUG SCREEN, HOSP PERFORMED
Amphetamines: NOT DETECTED
Barbiturates: NOT DETECTED
Benzodiazepines: NOT DETECTED
Cocaine: NOT DETECTED

## 2012-04-07 LAB — COMPREHENSIVE METABOLIC PANEL
ALT: 16 U/L (ref 0–53)
Albumin: 4.1 g/dL (ref 3.5–5.2)
Alkaline Phosphatase: 120 U/L — ABNORMAL HIGH (ref 39–117)
Calcium: 9.3 mg/dL (ref 8.4–10.5)
Potassium: 3.7 mEq/L (ref 3.5–5.1)
Sodium: 136 mEq/L (ref 135–145)
Total Protein: 7.2 g/dL (ref 6.0–8.3)

## 2012-04-07 LAB — CBC
MCH: 30.9 pg (ref 26.0–34.0)
MCHC: 35.4 g/dL (ref 30.0–36.0)
RDW: 13.8 % (ref 11.5–15.5)

## 2012-04-07 LAB — TROPONIN I: Troponin I: <0.3 ng/mL (ref <0.30)

## 2012-04-07 LAB — ETHANOL: Alcohol, Ethyl (B): 101 mg/dL — ABNORMAL HIGH (ref 0–11)

## 2012-04-07 NOTE — ED Notes (Signed)
Pt now stating that he started having cp --midsternal yesterday--sob at times, denies any n/v
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
## 2012-04-07 NOTE — ED Notes (Signed)
During triage pt stated he is suicidal, having frequent thoughts of killing himself, denies any harm to others. Denies having a plan, just thoughts at present, "sometimes if wish it would all end, my life has gone to shit", strong odor of etoh on pt's breath, admits to 2 beers this am. Denies any drug use.

## 2012-04-07 NOTE — ED Notes (Signed)
Pt fell Sunday and injured low back and both knees. Came to er on Sunday but left before being seen.  Strong etoh noted on pts breath.

## 2012-04-07 NOTE — ED Notes (Signed)
Per EDP pt is not suitable for sitter at this time.

## 2012-05-06 ENCOUNTER — Emergency Department (HOSPITAL_COMMUNITY)
Admission: EM | Admit: 2012-05-06 | Discharge: 2012-05-08 | Disposition: A | Discharge status: Home / Self Care | Payer: PRIVATE HEALTH INSURANCE | Attending: Emergency Medicine | Admitting: Emergency Medicine

## 2012-05-06 ENCOUNTER — Encounter (HOSPITAL_COMMUNITY): Payer: Self-pay | Admitting: *Deleted

## 2012-05-06 DIAGNOSIS — F10929 Alcohol use, unspecified with intoxication, unspecified: Secondary | ICD-10-CM

## 2012-05-06 DIAGNOSIS — F1010 Alcohol abuse, uncomplicated: Secondary | ICD-10-CM | POA: Insufficient documentation

## 2012-05-06 DIAGNOSIS — F17200 Nicotine dependence, unspecified, uncomplicated: Secondary | ICD-10-CM | POA: Insufficient documentation

## 2012-05-06 DIAGNOSIS — F319 Bipolar disorder, unspecified: Secondary | ICD-10-CM | POA: Insufficient documentation

## 2012-05-06 LAB — RAPID URINE DRUG SCREEN, HOSP PERFORMED
Amphetamines: NOT DETECTED
Barbiturates: NOT DETECTED
Benzodiazepines: NOT DETECTED
Cocaine: NOT DETECTED
Opiates: NOT DETECTED
Tetrahydrocannabinol: NOT DETECTED

## 2012-05-06 LAB — URINALYSIS, ROUTINE W REFLEX MICROSCOPIC
Bilirubin Urine: NEGATIVE
Glucose, UA: NEGATIVE mg/dL
Hgb urine dipstick: NEGATIVE
Specific Gravity, Urine: 1.007 (ref 1.005–1.030)
pH: 6 (ref 5.0–8.0)

## 2012-05-06 LAB — COMPREHENSIVE METABOLIC PANEL
ALT: 26 U/L (ref 0–53)
AST: 78 U/L — ABNORMAL HIGH (ref 0–37)
Albumin: 3.7 g/dL (ref 3.5–5.2)
CO2: 22 mEq/L (ref 19–32)
Calcium: 8.8 mg/dL (ref 8.4–10.5)
GFR calc non Af Amer: >90 mL/min (ref >90)
Sodium: 142 mEq/L (ref 135–145)
Total Protein: 6.5 g/dL (ref 6.0–8.3)

## 2012-05-06 LAB — CBC
MCH: 31.2 pg (ref 26.0–34.0)
Platelets: 338 10*3/uL (ref 150–400)
RBC: 4.74 MIL/uL (ref 4.22–5.81)
RDW: 14.9 % (ref 11.5–15.5)

## 2012-05-06 MED ORDER — ZOLPIDEM TARTRATE 5 MG PO TABS: 5.0000 mg | ORAL_TABLET | Freq: Every evening | ORAL | Status: DC | PRN

## 2012-05-06 MED ORDER — ONDANSETRON HCL 4 MG PO TABS: 4.0000 mg | ORAL_TABLET | Freq: Three times a day (TID) | ORAL | Status: DC | PRN

## 2012-05-06 MED ORDER — ACETAMINOPHEN 325 MG PO TABS
650.0000 mg | ORAL_TABLET | ORAL | Status: DC | PRN
  Administered 2012-05-07: 650 mg via ORAL
  Filled 2012-05-06: qty 2

## 2012-05-06 MED ORDER — NICOTINE 21 MG/24HR TD PT24
21.0000 mg | MEDICATED_PATCH | Freq: Every day | TRANSDERMAL | Status: DC
  Administered 2012-05-06 – 2012-05-07 (×2): 21 mg via TRANSDERMAL
  Filled 2012-05-06 (×2): qty 1

## 2012-05-06 MED ORDER — LORAZEPAM 1 MG PO TABS
1.0000 mg | ORAL_TABLET | Freq: Three times a day (TID) | ORAL | Status: DC | PRN
  Administered 2012-05-07: 1 mg via ORAL
  Filled 2012-05-06: qty 1
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
## 2012-05-06 NOTE — ED Notes (Signed)
Pt's upper dentures placed in denture cup, labeled with pt label, and placed in pt belonging bag with tennis shoes.
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
## 2012-05-06 NOTE — ED Notes (Signed)
Patient assessed and appears to be anxious and agitated. Directed to bathroom , patient is s/p left knee replacement but able to walk indep. Patient is requesting detox and voluntary . Wanted to sleep will reassess psych issues once awake
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
## 2012-05-06 NOTE — ED Notes (Signed)
Pt belongings, jeans, shirt, socks, tennis shoes wanded and placed in pt belonging bags. Valuables inventoried and locked in safe deposit box #1.
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
## 2012-05-07 NOTE — ED Provider Notes (Signed)
Medical screening examination/treatment/procedure(s) were performed by non-physician practitioner and as supervising physician I was immediately available for consultation/collaboration.   Lyanne Co, MD 05/07/12 [phone]

## 2012-05-07 NOTE — ED Notes (Signed)
Patient ambulated to bathroom.

## 2012-05-08 NOTE — ED Provider Notes (Addendum)
Pt up and ambulatory today. Has good eyecontact. States he had recent knee replacement and he was unable to get up off a curb by himself because of weakness in his thighs. States he had a relapse and he feels fine now. States he was sober for 3 years and has had another episode where he was sober to 10 years. He states he goes to AA and has a job he needs to get back to. I felt patient could be discharged, and have asked [facility] to see.   09:40 Clydie Braun, ACT agrees he seems able to be discharged with outpatient referrals.   Devoria Albe, MD, FACEP   Ward Givens, MD 05/08/12 2956  Ward Givens, MD 05/08/12 [phone]
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
## 2012-06-15 ENCOUNTER — Encounter (HOSPITAL_COMMUNITY): Admission: EM | Disposition: A | Discharge status: Skilled Nursing Facility | Payer: Self-pay | Source: Home / Self Care

## 2012-06-15 ENCOUNTER — Inpatient Hospital Stay: Admit: 2012-06-15 | Payer: Self-pay | Admitting: General Surgery

## 2012-06-15 ENCOUNTER — Encounter (HOSPITAL_COMMUNITY): Payer: Self-pay | Admitting: Certified Registered Nurse Anesthetist

## 2012-06-15 ENCOUNTER — Encounter (HOSPITAL_COMMUNITY): Payer: Self-pay

## 2012-06-15 ENCOUNTER — Emergency Department (HOSPITAL_COMMUNITY): Payer: PRIVATE HEALTH INSURANCE

## 2012-06-15 ENCOUNTER — Emergency Department (HOSPITAL_COMMUNITY): Payer: PRIVATE HEALTH INSURANCE | Admitting: Certified Registered Nurse Anesthetist

## 2012-06-15 ENCOUNTER — Inpatient Hospital Stay (HOSPITAL_COMMUNITY)
Admission: EM | Admit: 2012-06-15 | Discharge: 2012-06-24 | DRG: 329 | Disposition: A | Discharge status: Skilled Nursing Facility | Payer: PRIVATE HEALTH INSURANCE | Attending: Surgery | Admitting: Surgery

## 2012-06-15 DIAGNOSIS — F17200 Nicotine dependence, unspecified, uncomplicated: Secondary | ICD-10-CM | POA: Diagnosis present

## 2012-06-15 DIAGNOSIS — K56609 Unspecified intestinal obstruction, unspecified as to partial versus complete obstruction: Secondary | ICD-10-CM

## 2012-06-15 DIAGNOSIS — F1411 Cocaine abuse, in remission: Secondary | ICD-10-CM | POA: Diagnosis present

## 2012-06-15 DIAGNOSIS — R5381 Other malaise: Secondary | ICD-10-CM | POA: Diagnosis not present

## 2012-06-15 DIAGNOSIS — I959 Hypotension, unspecified: Secondary | ICD-10-CM | POA: Diagnosis present

## 2012-06-15 DIAGNOSIS — K4140 Unilateral femoral hernia, with gangrene, not specified as recurrent: Principal | ICD-10-CM | POA: Diagnosis present

## 2012-06-15 DIAGNOSIS — IMO0001 Reserved for inherently not codable concepts without codable children: Secondary | ICD-10-CM

## 2012-06-15 DIAGNOSIS — K631 Perforation of intestine (nontraumatic): Secondary | ICD-10-CM | POA: Diagnosis present

## 2012-06-15 DIAGNOSIS — E876 Hypokalemia: Secondary | ICD-10-CM | POA: Diagnosis not present

## 2012-06-15 DIAGNOSIS — R1115 Cyclical vomiting syndrome unrelated to migraine: Secondary | ICD-10-CM | POA: Diagnosis present

## 2012-06-15 DIAGNOSIS — N179 Acute kidney failure, unspecified: Secondary | ICD-10-CM | POA: Diagnosis present

## 2012-06-15 DIAGNOSIS — F1211 Cannabis abuse, in remission: Secondary | ICD-10-CM | POA: Diagnosis present

## 2012-06-15 DIAGNOSIS — K560 Paralytic ileus: Secondary | ICD-10-CM | POA: Diagnosis not present

## 2012-06-15 DIAGNOSIS — F3130 Bipolar disorder, current episode depressed, mild or moderate severity, unspecified: Secondary | ICD-10-CM | POA: Diagnosis present

## 2012-06-15 DIAGNOSIS — K4030 Unilateral inguinal hernia, with obstruction, without gangrene, not specified as recurrent: Secondary | ICD-10-CM

## 2012-06-15 DIAGNOSIS — R404 Transient alteration of awareness: Secondary | ICD-10-CM | POA: Diagnosis present

## 2012-06-15 HISTORY — PX: BOWEL RESECTION: SHX1257

## 2012-06-15 HISTORY — PX: INGUINAL HERNIA REPAIR: SHX194

## 2012-06-15 LAB — COMPREHENSIVE METABOLIC PANEL
ALT: 7 U/L (ref 0–53)
AST: 11 U/L (ref 0–37)
CO2: 35 mEq/L — ABNORMAL HIGH (ref 19–32)
Chloride: 80 mEq/L — ABNORMAL LOW (ref 96–112)
GFR calc non Af Amer: 13 mL/min — ABNORMAL LOW (ref >90)
Sodium: 134 mEq/L — ABNORMAL LOW (ref 135–145)
Total Bilirubin: 0.5 mg/dL (ref 0.3–1.2)

## 2012-06-15 LAB — BASIC METABOLIC PANEL
Calcium: 7.2 mg/dL — ABNORMAL LOW (ref 8.4–10.5)
GFR calc Af Amer: 30 mL/min — ABNORMAL LOW (ref >90)
GFR calc non Af Amer: 26 mL/min — ABNORMAL LOW (ref >90)
Glucose, Bld: 142 mg/dL — ABNORMAL HIGH (ref 70–99)
Potassium: 3 mEq/L — ABNORMAL LOW (ref 3.5–5.1)
Sodium: 137 mEq/L (ref 135–145)

## 2012-06-15 LAB — CBC WITH DIFFERENTIAL/PLATELET
Basophils Absolute: 0 10*3/uL (ref 0.0–0.1)
Lymphocytes Relative: 7 % — ABNORMAL LOW (ref 12–46)
Neutro Abs: 3.5 10*3/uL (ref 1.7–7.7)
Platelets: 326 10*3/uL (ref 150–400)
RDW: 14.1 % (ref 11.5–15.5)
WBC: 5 10*3/uL (ref 4.0–10.5)

## 2012-06-15 SURGERY — REPAIR, HERNIA, INGUINAL, INCARCERATED
Anesthesia: General | Site: Abdomen | Laterality: Right | Wound class: Clean Contaminated

## 2012-06-15 MED ORDER — MORPHINE SULFATE 4 MG/ML IJ SOLN: 4.0000 mg | INTRAMUSCULAR | Status: DC | PRN

## 2012-06-15 MED ORDER — LIDOCAINE HCL (CARDIAC) 20 MG/ML IV SOLN
INTRAVENOUS | Status: DC | PRN
  Administered 2012-06-15: 100 mg via INTRAVENOUS

## 2012-06-15 MED ORDER — BUPIVACAINE-EPINEPHRINE 0.25% -1:200000 IJ SOLN
INTRAMUSCULAR | Status: AC
  Filled 2012-06-15: qty 1

## 2012-06-15 MED ORDER — 0.9 % SODIUM CHLORIDE (POUR BTL) OPTIME
TOPICAL | Status: DC | PRN
  Administered 2012-06-15: 4000 mL

## 2012-06-15 MED ORDER — POTASSIUM CHLORIDE 10 MEQ/100ML IV SOLN
INTRAVENOUS | Status: DC | PRN
  Administered 2012-06-15 (×2): 10 meq via INTRAVENOUS

## 2012-06-15 MED ORDER — SODIUM CHLORIDE 0.9 % IV SOLN
1000.0000 mL | Freq: Once | INTRAVENOUS | Status: AC
  Administered 2012-06-15: 1000 mL via INTRAVENOUS

## 2012-06-15 MED ORDER — NEOSTIGMINE METHYLSULFATE 1 MG/ML IJ SOLN
INTRAMUSCULAR | Status: DC | PRN
  Administered 2012-06-15: 5 mg via INTRAVENOUS

## 2012-06-15 MED ORDER — LACTATED RINGERS IV SOLN
INTRAVENOUS | Status: DC | PRN
  Administered 2012-06-15: 14:00:00 via INTRAVENOUS

## 2012-06-15 MED ORDER — LACTATED RINGERS IV SOLN: INTRAVENOUS | Status: DC

## 2012-06-15 MED ORDER — MORPHINE SULFATE 4 MG/ML IJ SOLN
INTRAMUSCULAR | Status: AC
  Administered 2012-06-15: 4 mg
  Filled 2012-06-15: qty 1

## 2012-06-15 MED ORDER — ONDANSETRON HCL 4 MG/2ML IJ SOLN
INTRAMUSCULAR | Status: AC
  Filled 2012-06-15: qty 2

## 2012-06-15 MED ORDER — SODIUM CHLORIDE 0.9 % IV SOLN
1000.0000 mL | INTRAVENOUS | Status: DC
Start: 2012-06-15 — End: 2012-06-15
  Administered 2012-06-15: 1000 mL via INTRAVENOUS

## 2012-06-15 MED ORDER — BUPIVACAINE-EPINEPHRINE 0.25% -1:200000 IJ SOLN
INTRAMUSCULAR | Status: DC | PRN
  Administered 2012-06-15: 36 mL

## 2012-06-15 MED ORDER — ONDANSETRON HCL 4 MG/2ML IJ SOLN
4.0000 mg | Freq: Four times a day (QID) | INTRAMUSCULAR | Status: DC | PRN
  Administered 2012-06-15: 4 mg via INTRAVENOUS
  Filled 2012-06-15: qty 2

## 2012-06-15 MED ORDER — FENTANYL CITRATE 0.05 MG/ML IJ SOLN
INTRAMUSCULAR | Status: DC | PRN
Start: 2012-06-15 — End: 2012-06-15
  Administered 2012-06-15 (×2): 50 ug via INTRAVENOUS
  Administered 2012-06-15: 25 ug via INTRAVENOUS
  Administered 2012-06-15 (×2): 50 ug via INTRAVENOUS
  Administered 2012-06-15: 25 ug via INTRAVENOUS

## 2012-06-15 MED ORDER — MIDAZOLAM HCL 5 MG/5ML IJ SOLN
INTRAMUSCULAR | Status: DC | PRN
  Administered 2012-06-15: 2 mg via INTRAVENOUS

## 2012-06-15 MED ORDER — ONDANSETRON HCL 4 MG/2ML IJ SOLN
INTRAMUSCULAR | Status: DC | PRN
  Administered 2012-06-15: 4 mg via INTRAVENOUS

## 2012-06-15 MED ORDER — DEXTROSE 5 % IV SOLN
2.0000 g | INTRAVENOUS | Status: AC
  Administered 2012-06-15: 2 g via INTRAVENOUS
  Filled 2012-06-15: qty 2

## 2012-06-15 MED ORDER — GLYCOPYRROLATE 0.2 MG/ML IJ SOLN
INTRAMUSCULAR | Status: DC | PRN
  Administered 2012-06-15: 0.6 mg via INTRAVENOUS

## 2012-06-15 MED ORDER — BIOTENE DRY MOUTH MT LIQD
15.0000 mL | Freq: Two times a day (BID) | OROMUCOSAL | Status: DC
  Administered 2012-06-15 – 2012-06-24 (×17): 15 mL via OROMUCOSAL

## 2012-06-15 MED ORDER — MORPHINE SULFATE 2 MG/ML IJ SOLN
2.0000 mg | INTRAMUSCULAR | Status: DC | PRN
  Administered 2012-06-15: 6 mg via INTRAVENOUS
  Administered 2012-06-15: 4 mg via INTRAVENOUS
  Administered 2012-06-16: 2 mg via INTRAVENOUS
  Administered 2012-06-16: 6 mg via INTRAVENOUS
  Administered 2012-06-16: 4 mg via INTRAVENOUS
  Administered 2012-06-16 (×4): 2 mg via INTRAVENOUS
  Administered 2012-06-16: 6 mg via INTRAVENOUS
  Administered 2012-06-16: 4 mg via INTRAVENOUS
  Administered 2012-06-17: 2 mg via INTRAVENOUS
  Administered 2012-06-17 (×2): 6 mg via INTRAVENOUS
  Administered 2012-06-18 (×2): 2 mg via INTRAVENOUS
  Filled 2012-06-15: qty 2
  Filled 2012-06-15 (×2): qty 3
  Filled 2012-06-15: qty 1
  Filled 2012-06-15: qty 2
  Filled 2012-06-15 (×4): qty 1
  Filled 2012-06-15 (×2): qty 2
  Filled 2012-06-15 (×3): qty 1
  Filled 2012-06-15 (×2): qty 3
  Filled 2012-06-15: qty 1

## 2012-06-15 MED ORDER — CISATRACURIUM BESYLATE (PF) 10 MG/5ML IV SOLN
INTRAVENOUS | Status: DC | PRN
  Administered 2012-06-15 (×3): 2 mg via INTRAVENOUS
  Administered 2012-06-15: 1 mg via INTRAVENOUS
  Administered 2012-06-15 (×2): 2 mg via INTRAVENOUS
  Administered 2012-06-15: 6 mg via INTRAVENOUS

## 2012-06-15 MED ORDER — HYDROMORPHONE HCL PF 1 MG/ML IJ SOLN
0.2500 mg | INTRAMUSCULAR | Status: DC | PRN
  Administered 2012-06-15 (×4): 0.5 mg via INTRAVENOUS

## 2012-06-15 MED ORDER — POTASSIUM CHLORIDE 10 MEQ/100ML IV SOLN
10.0000 meq | INTRAVENOUS | Status: DC
  Filled 2012-06-15: qty 100

## 2012-06-15 MED ORDER — HYDROMORPHONE HCL PF 1 MG/ML IJ SOLN
INTRAMUSCULAR | Status: AC
  Filled 2012-06-15: qty 1

## 2012-06-15 MED ORDER — HETASTARCH-ELECTROLYTES 6 % IV SOLN
INTRAVENOUS | Status: DC | PRN
  Administered 2012-06-15: 15:00:00 via INTRAVENOUS

## 2012-06-15 MED ORDER — POTASSIUM CHLORIDE 10 MEQ/100ML IV SOLN
10.0000 meq | Freq: Once | INTRAVENOUS | Status: AC
  Administered 2012-06-15: 10 meq via INTRAVENOUS
  Filled 2012-06-15 (×2): qty 100

## 2012-06-15 MED ORDER — DEXAMETHASONE SODIUM PHOSPHATE 10 MG/ML IJ SOLN
INTRAMUSCULAR | Status: DC | PRN
  Administered 2012-06-15: 10 mg via INTRAVENOUS

## 2012-06-15 MED ORDER — DEXTROSE IN LACTATED RINGERS 5 % IV SOLN
INTRAVENOUS | Status: DC
  Administered 2012-06-15: 19:00:00 via INTRAVENOUS
  Administered 2012-06-16: 1000 mL via INTRAVENOUS

## 2012-06-15 MED ORDER — PROMETHAZINE HCL 25 MG/ML IJ SOLN: 6.2500 mg | INTRAMUSCULAR | Status: DC | PRN

## 2012-06-15 MED ORDER — SUCCINYLCHOLINE CHLORIDE 20 MG/ML IJ SOLN
INTRAMUSCULAR | Status: DC | PRN
  Administered 2012-06-15: 100 mg via INTRAVENOUS

## 2012-06-15 MED ORDER — DEXTROSE 5 % IV SOLN
2.0000 g | Freq: Three times a day (TID) | INTRAVENOUS | Status: DC
  Administered 2012-06-15 – 2012-06-23 (×22): 2 g via INTRAVENOUS
  Filled 2012-06-15 (×25): qty 2

## 2012-06-15 MED ORDER — PROPOFOL 10 MG/ML IV BOLUS
INTRAVENOUS | Status: DC | PRN
  Administered 2012-06-15: 160 mg via INTRAVENOUS
  Administered 2012-06-15: 40 mg via INTRAVENOUS

## 2012-06-15 MED ORDER — ONDANSETRON HCL 4 MG/2ML IJ SOLN
4.0000 mg | Freq: Once | INTRAMUSCULAR | Status: AC
  Administered 2012-06-15: 4 mg via INTRAVENOUS

## 2012-06-15 MED ORDER — MORPHINE SULFATE 2 MG/ML IJ SOLN
INTRAMUSCULAR | Status: AC
  Filled 2012-06-15: qty 1

## 2012-06-15 MED ORDER — ONDANSETRON HCL 4 MG PO TABS: 4.0000 mg | ORAL_TABLET | Freq: Four times a day (QID) | ORAL | Status: DC | PRN

## 2012-06-15 MED ORDER — HEPARIN SODIUM (PORCINE) 5000 UNIT/ML IJ SOLN
5000.0000 [IU] | Freq: Three times a day (TID) | INTRAMUSCULAR | Status: DC
  Administered 2012-06-16 – 2012-06-24 (×24): 5000 [IU] via SUBCUTANEOUS
  Filled 2012-06-15 (×28): qty 1

## 2012-06-15 SURGICAL SUPPLY — 73 items
APPLICATOR COTTON TIP 6IN STRL (MISCELLANEOUS) ×6 IMPLANT
BLADE EXTENDED COATED 6.5IN (ELECTRODE) ×3 IMPLANT
BLADE HEX COATED 2.75 (ELECTRODE) ×3 IMPLANT
BLADE SURG SZ10 CARB STEEL (BLADE) ×3 IMPLANT
CANISTER SUCTION 2500CC (MISCELLANEOUS) ×3 IMPLANT
CELLS DAT CNTRL 66122 CELL SVR (MISCELLANEOUS) ×2 IMPLANT
CHLORAPREP W/TINT 10.5 ML (MISCELLANEOUS) ×3 IMPLANT
CLIP TI LARGE 6 (CLIP) IMPLANT
CLOTH BEACON ORANGE TIMEOUT ST (SAFETY) ×3 IMPLANT
COVER MAYO STAND STRL (DRAPES) ×3 IMPLANT
DECANTER SPIKE VIAL GLASS SM (MISCELLANEOUS) ×3 IMPLANT
DRAIN PENROSE 18X1/2 LTX STRL (DRAIN) ×3 IMPLANT
DRAPE INCISE IOBAN 66X45 STRL (DRAPES) ×3 IMPLANT
DRAPE LAPAROSCOPIC ABDOMINAL (DRAPES) ×3 IMPLANT
DRAPE LG THREE QUARTER DISP (DRAPES) IMPLANT
DRAPE UTILITY XL STRL (DRAPES) ×2 IMPLANT
DRAPE WARM FLUID 44X44 (DRAPE) ×3 IMPLANT
ELECT REM PT RETURN 9FT ADLT (ELECTROSURGICAL) ×3
ELECTRODE REM PT RTRN 9FT ADLT (ELECTROSURGICAL) ×2 IMPLANT
GLOVE BIOGEL PI IND STRL 6 (GLOVE) ×2 IMPLANT
GLOVE BIOGEL PI IND STRL 7.0 (GLOVE) ×2 IMPLANT
GLOVE BIOGEL PI IND STRL 7.5 (GLOVE) ×2 IMPLANT
GLOVE BIOGEL PI INDICATOR 6 (GLOVE) ×1
GLOVE BIOGEL PI INDICATOR 7.0 (GLOVE) ×1
GLOVE BIOGEL PI INDICATOR 7.5 (GLOVE) ×1
GLOVE ECLIPSE 7.0 STRL STRAW (GLOVE) ×2 IMPLANT
GLOVE INDICATOR 8.0 STRL GRN (GLOVE) ×3 IMPLANT
GLOVE SS BIOGEL STRL SZ 7.5 (GLOVE) ×6 IMPLANT
GLOVE SUPERSENSE BIOGEL SZ 7.5 (GLOVE) ×3
GLOVE SURG SS PI 6.5 STRL IVOR (GLOVE) ×8 IMPLANT
GOWN STRL NON-REIN LRG LVL3 (GOWN DISPOSABLE) ×9 IMPLANT
GOWN STRL REIN XL XLG (GOWN DISPOSABLE) ×6 IMPLANT
HAND ACTIVATED (MISCELLANEOUS) IMPLANT
KIT BASIN OR (CUSTOM PROCEDURE TRAY) ×3 IMPLANT
LEGGING LITHOTOMY PAIR STRL (DRAPES) IMPLANT
LIGASURE IMPACT 36 18CM CVD LR (INSTRUMENTS) IMPLANT
NEEDLE HYPO 22GX1.5 SAFETY (NEEDLE) ×3 IMPLANT
NS IRRIG 1000ML POUR BTL (IV SOLUTION) ×12 IMPLANT
PACK GENERAL/GYN (CUSTOM PROCEDURE TRAY) ×3 IMPLANT
RELOAD PROXIMATE 75MM BLUE (ENDOMECHANICALS) ×6 IMPLANT
RETRACTOR WND ALEXIS 18 MED (MISCELLANEOUS) IMPLANT
RTRCTR WOUND ALEXIS 18CM MED (MISCELLANEOUS) ×3
SCALPEL HARMONIC ACE (MISCELLANEOUS) IMPLANT
SPONGE GAUZE 4X4 12PLY (GAUZE/BANDAGES/DRESSINGS) ×3 IMPLANT
SPONGE LAP 18X18 X RAY DECT (DISPOSABLE) ×3 IMPLANT
STAPLER GUN LINEAR PROX 60 (STAPLE) ×3 IMPLANT
STAPLER PROXIMATE 75MM BLUE (STAPLE) ×3 IMPLANT
STAPLER VISISTAT 35W (STAPLE) ×3 IMPLANT
SUCTION POOLE TIP (SUCTIONS) ×6 IMPLANT
SUT NOV 1 T60/GS (SUTURE) IMPLANT
SUT NOVA 1 T20/GS 25DT (SUTURE) IMPLANT
SUT NOVA NAB DX-16 0-1 5-0 T12 (SUTURE) IMPLANT
SUT NOVA T20/GS 25 (SUTURE) IMPLANT
SUT PDS AB 1 CTX 36 (SUTURE) IMPLANT
SUT PDS AB 1 TP1 96 (SUTURE) ×4 IMPLANT
SUT PROLENE 0 CT 2 (SUTURE) ×12 IMPLANT
SUT SILK 2 0 (SUTURE) ×3
SUT SILK 2 0 SH CR/8 (SUTURE) ×3 IMPLANT
SUT SILK 2 0SH CR/8 30 (SUTURE) IMPLANT
SUT SILK 2-0 18XBRD TIE 12 (SUTURE) ×2 IMPLANT
SUT SILK 2-0 30XBRD TIE 12 (SUTURE) IMPLANT
SUT SILK 3 0 (SUTURE) ×6
SUT SILK 3 0 SH CR/8 (SUTURE) ×3 IMPLANT
SUT SILK 3-0 18XBRD TIE 12 (SUTURE) ×4 IMPLANT
SUT VIC AB 2-0 SH 27 (SUTURE) ×3
SUT VIC AB 2-0 SH 27X BRD (SUTURE) ×2 IMPLANT
SUT VIC AB 3-0 54XBRD REEL (SUTURE) IMPLANT
SUT VIC AB 3-0 BRD 54 (SUTURE)
SYR CONTROL 10ML LL (SYRINGE) ×3 IMPLANT
TAPE CLOTH SURG 4X10 WHT LF (GAUZE/BANDAGES/DRESSINGS) ×3 IMPLANT
TOWEL OR 17X26 10 PK STRL BLUE (TOWEL DISPOSABLE) ×3 IMPLANT
TRAY FOLEY CATH 14FRSI W/METER (CATHETERS) ×3 IMPLANT
YANKAUER SUCT BULB TIP NO VENT (SUCTIONS) ×3 IMPLANT

## 2012-06-15 NOTE — Op Note (Signed)
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 He is also noted a painful lump in his right groin over the same period of time. Examination and imaging showed evidence of small bowel obstruction and he has an erythematous tender mass in the right groin consistent with an incarcerated hernia. I recommend proceeding with emergency repair of his incarcerated hernia and discussed that laparotomy with small bowel resection may well be required. We discussed the indications for the procedure, nature, and risks of anesthetic complications, bleeding, infection and anastomotic leak and recurrent hernia and chronic pain.  Procedure Detail:  Patient is brought to the operating room, placed in the supine position on the operating table and general endotracheal anesthesia induced. Foley catheter was placed. NG tube was placed. He had received broad-spectrum IV antibiotics. The abdomen and groins were widely sterilely prepped and draped with Ioban. Patient timeout was performed and correct patient procedure verified. An oblique incision was made in the right inguinal area and dissection carried down to the subcutaneous tissue. I dissected down onto the external oblique and there was a large hernia mass protruding underneath the inguinal ligament consistent with a femoral hernia. The hernia sac was dissected away from the soft tissue beneath the inguinal ligament. It was obviously gangrenous. It  was very thick and I attempted to open the sac but the tissue planes were very edematous and obscured. I opened the inguinal ligament along the lines of its fibers through the external ring and confirmed there was no evidence of indirect or direct inguinal hernia. Above the inguinal ligament I was then able to dilate the space and opened a bit of the inguinal ligament to release the tension on the hernia sac. He was able to be dissected free from surrounding tissue as I dissected and the sac it was apparent that this was a markedly thickened and necrotic hernia sac but the bowel had reduced into the abdominal cavity. It was apparent from the small size the femoral defect that I would've had to proceed with laparotomy for small bowel resection anyway. At this point I made a small low midline incision in the abdomen and dissection was carried down through subcutaneous tissue and midline fascia and the peritoneum entered under direct vision. A wound protector was placed. There was markedly dilated loops of small bowel which I traced down distally to a short segment of necrotic small bowel with an adjacent perforation. The more distal terminal ileum was completely decompressed. The perforation was controlled with bowel clamps just proximal and distal to the area of infarction. I performed a short segment small bowel resection dividing the healthy small bowel proximal and distal to the necrotic segment with the GIA 75 mm stapler. The mesentery of the necrotic segment was then divided between [facility] clamps and tied with 2-0 silk ties and the specimen removed. A functional end-to-end anastomosis was then created between the 2 ends of small bowel and a single firing of the GIA 75 mm stapler. The staple line was intact and without bleeding. The proximal small bowel was partially decompressed through the enterotomy  with a pool sucker.  The common enterotomy was then closed with a single firing of the TA 60 stapler. The  anastomosis was widely patent and under no tension with good blood supply and was airtight to pressure testing.the abdomen was then thoroughly irrigated. The viscera were returned to their anatomic position. The midline fascia and peritoneum were closed with looped running #1 PDS beginning at either end of the incision and tied centrally. Attention was then turned to the groin hernia. The necrotic hernia sac was debrided back to healthy tissue it was closed at the pubic bone with running 2-0 Vicryl. I then opened the transversalis fascia from the pubic tubercle up toward the epigastric vessels. The conjoined tendon was identified medially and I performed a McVay repair with interrupted 0 Prolene suturing the conjoined tendon medially down to Cooper's ligament laterally. As I approached the femoral vein a triangle stitch was placed between the conjoined tendon and Cooper's ligament and the inguinal ligament. Working more laterally the inguinal ligament was then closed over to Cooper's ligament up to the internal ring which was noted to have fingertip. This appeared to snugly close the femoral space. Following this the soft tissue was infiltrated with Marcaine in both wounds. Both wounds were irrigated with saline. The external oblique was closed over the cord with running 2-0 Vicryl. Subcutaneous was closed with running 2-0 Vicryl. Both incisions were closed with staples. Sponge needle and instrument counts were correct.          Drains: none  Blood Given: none          Specimens: segment of small intestine and hernia sac        Complications:  * No complications entered in OR log *         Disposition: PACU - hemodynamically stable.         Condition: stable  Mariella Saa MD, FACS  06/15/2012, 5:09 PM

## 2012-06-15 NOTE — ED Notes (Signed)
ZOX:[id number]<AV> Expected date:<BR> Expected time:<BR> Means of arrival:<BR> Comments:<BR> Nausea/vomiting/vertigo

## 2012-06-15 NOTE — Anesthesia Preprocedure Evaluation (Addendum)
Anesthesia Evaluation  Patient identified by MRN, date of birth, ID band Patient awake    Reviewed: Allergy & Precautions, H&P , NPO status , Patient's Chart, lab work & pertinent test results  Airway Mallampati: II TM Distance: >3 FB Neck ROM: Full    Dental No notable dental hx.    Pulmonary Current Smoker,  breath sounds clear to auscultation  Pulmonary exam normal       Cardiovascular negative cardio ROS  Rhythm:Regular Rate:Normal  ECG: Wide complex tachycardia: RBBB and LPFB   Neuro/Psych PSYCHIATRIC DISORDERS Depression Bipolar Disorder negative neurological ROS     GI/Hepatic negative GI ROS, (+)     substance abuse  alcohol use, cocaine use and marijuana use, Remission since 2010   Endo/Other  negative endocrine ROS  Renal/GU negative Renal ROS  negative genitourinary   Musculoskeletal negative musculoskeletal ROS (+)   Abdominal   Peds negative pediatric ROS (+)  Hematology negative hematology ROS (+)   Anesthesia Other Findings   Reproductive/Obstetrics negative OB ROS                          Anesthesia Physical Anesthesia Plan  ASA: II  Anesthesia Plan: General   Post-op Pain Management:    Induction: Intravenous  Airway Management Planned:   Additional Equipment:   Intra-op Plan:   Post-operative Plan: Extubation in OR  Informed Consent: I have reviewed the patients History and Physical, chart, labs and discussed the procedure including the risks, benefits and alternatives for the proposed anesthesia with the patient or authorized representative who has indicated his/her understanding and acceptance.   Dental advisory given  Plan Discussed with: CRNA  Anesthesia Plan Comments:         Anesthesia Quick Evaluation

## 2012-06-15 NOTE — ED Notes (Signed)
Per EMS Pt has had nausea and vomiting for 3 days. With no significant hx

## 2012-06-15 NOTE — ED Provider Notes (Signed)
History    CSN: [id number] Arrival date & time 06/15/12  1128 First MD Initiated Contact with Patient 06/15/12 1134    Chief Complaint  Patient presents with  . Nausea    and vomiting     HPI Comments: It started with N/V on Thursday.  SInce then it has gotten worse and he cant keep anything down.  Pt has been vomiting green material now.  He feels like he pulled something in his abdomen with all of the vomiting.  Patient is a 61 y.o. male presenting with abdominal pain.  Abdominal Pain The primary symptoms of the illness include abdominal pain, nausea and vomiting. The primary symptoms of the illness do not include fever, shortness of breath, hematochezia or dysuria. The onset of the illness was gradual. The problem has been gradually worsening.  The abdominal pain is located in the RLQ (also all over). The abdominal pain does not radiate. The abdominal pain is relieved by nothing. The abdominal pain is exacerbated by vomiting.    Past Medical History  Diagnosis Date  . Osteoarthritis X years    left knee.  Distant hx (age 62) "dislocated" knee and required surgery, then crush injury to patella required knee cap removal.  . Diverticular disease     "itis" x 2 episodes (last was about 2007)  . Tobacco dependence   . Multiple rib fractures 10/09/10    s/p fall down a ravine--9th and 10th on right, contusion of left 7th and 8th ribs  . Fracture of metatarsal bone(s), closed     3rd, 4th, 5th mid shaft on left foot  . History of substance abuse     cocaine, marijuana, acid, alcohol (in remission since 2010)  . Solar dermatitis     face and ears  . Bipolar disorder     Has had multiple psych hospitalization in the past, most recently at [facility] 01/31/11-02/04/11.  Says meds made him emotionally blunted. (lamictal and wellbutrin).  . Alcoholism     "Dry" since 2002  . Depression     Past Surgical History  Procedure Date  . Knee dislocation surgery 1966  . Patellectomy  1968    s/p crush injury  . Inguinal hernia repair     left  . Hernia repair     at 61 years of age  67  . Total knee arthroplasty 11/20/2011    Procedure: TOTAL KNEE ARTHROPLASTY;  Surgeon: Loreta Ave, MD;  Location: [facility]: [facility];  Service: Orthopedics;  Laterality: Left;  DR Eulah Pont WANTS FOR THIS CASE    Family History  Problem Relation Age of Onset  . Arthritis Mother   . Arthritis Father   . Cancer Brother     lung cancer.  Died in early 28s.    History  Substance Use Topics  . Smoking status: Current Every Day Smoker -- 1.0 packs/day for 30 years    Types: Cigarettes  . Smokeless tobacco: Never Used  . Alcohol Use: Yes     daily      Review of Systems  Constitutional: Negative for fever.  Respiratory: Negative for shortness of breath.   Cardiovascular: Positive for chest pain.  Gastrointestinal: Positive for nausea, vomiting and abdominal pain. Negative for hematochezia.  Genitourinary: Negative for dysuria.  All other systems reviewed and are negative.    Allergies  Codeine  Home Medications   Current Outpatient Rx  Name Route Sig Dispense Refill  . BUPROPION HCL ER (SR) 150 MG PO  TB12 Oral Take 1 tablet (150 mg total) by mouth 2 (two) times daily. 60 tablet 3    BP 89/64  Pulse 114  Temp 97.8 F (36.6 C) (Oral)  Resp 18  SpO2 97%  Physical Exam  Nursing note and vitals reviewed. Constitutional: He appears distressed.       Actively vomiting  HENT:  Head: Normocephalic and atraumatic.  Right Ear: External ear normal.  Left Ear: External ear normal.  Eyes: Conjunctivae normal are normal. Right eye exhibits no discharge. Left eye exhibits no discharge. No scleral icterus.  Neck: Neck supple. No tracheal deviation present.  Cardiovascular: Normal rate, regular rhythm and intact distal pulses.   Pulmonary/Chest: Effort normal and breath sounds normal. No stridor. No respiratory distress. He has no wheezes. He has no rales.  Abdominal:  Soft. He exhibits no distension. Bowel sounds are decreased. There is tenderness in the right lower quadrant. There is guarding. There is no rigidity and no rebound. A hernia is present. Hernia confirmed positive in the right inguinal area.       Incarcerated hernia right inguinal region, firm, ttp  Musculoskeletal: He exhibits no edema and no tenderness.  Neurological: He is alert. He has normal strength. No sensory deficit. Cranial nerve deficit:  no gross defecits noted. He exhibits normal muscle tone. He displays no seizure activity. Coordination normal.  Skin: Skin is warm and dry. No rash noted. He is not diaphoretic.  Psychiatric: He has a normal mood and affect.    ED Course  Procedures (including critical care time) EKG Rate 115, right axis WIDE COMPLEX TACHYCARDIA ~ V-rate> 99, QRSd>120 RBBB AND LPFB ~ QRSd >146mS, axis(90,210) RBBB present on previous ECG  CRITICAL CARE Performed by: Linwood Dibbles R Total critical care time: 35 Critical care time was exclusive of separately billable procedures and treating other patients. Critical care was necessary to treat or prevent imminent or life-threatening deterioration. Critical care was time spent personally by me on the following activities: development of treatment plan with patient and/or surrogate as well as nursing, discussions with consultants, evaluation of patient's response to treatment, examination of patient, obtaining history from patient or surrogate, ordering and performing treatments and interventions, ordering and review of laboratory studies, ordering and review of radiographic studies, pulse oximetry and re-evaluation of patient's condition.  Labs Reviewed  CBC WITH DIFFERENTIAL - Abnormal; Notable for the following:    Hemoglobin 18.0 (*)     Lymphocytes Relative 7 (*)     Lymphs Abs 0.3 (*)     Monocytes Relative 22 (*)     Monocytes Absolute 1.1 (*)     All other components within normal limits  COMPREHENSIVE  METABOLIC PANEL - Abnormal; Notable for the following:    Sodium 134 (*)     Potassium 2.7 (*)     Chloride 80 (*)     CO2 35 (*)     Glucose, Bld 124 (*)     BUN 53 (*)     Creatinine, Ser 4.32 (*)     Calcium 8.3 (*)     GFR calc non Af Amer 13 (*)     GFR calc Af Amer 15 (*)     All other components within normal limits  LIPASE, BLOOD - Abnormal; Notable for the following:    Lipase 8 (*)     All other components within normal limits  URINALYSIS, ROUTINE W REFLEX MICROSCOPIC   Dg Abd Acute W/chest  06/15/2012  *RADIOLOGY REPORT*  Clinical Data:  Weakness.  Nausea, vomiting.  Abdominal pain.  ACUTE ABDOMEN SERIES (ABDOMEN 2 VIEW & CHEST 1 VIEW)  Comparison: Chest x-ray 11/13/2011  Findings: Heart is mildly enlarged.  There are no focal consolidations or pleural effusions.  No pulmonary edema.  Supine and left lateral decubitus views of the abdomen show marked dilatation of small bowel loops which measure 5.8 cm.  There is overall paucity of large bowel gas.  Air fluid levels are identified within the dilated small bowel loops.  No free intraperitoneal air.  IMPRESSION:  1.  Cardiomegaly without pulmonary edema. 2.  Findings consistent with high-grade partial or early small bowel obstruction.   Original Report Authenticated By: Patterson Hammersmith, M.D.      1. Small bowel obstruction   2. Incarcerated inguinal hernia       MDM  Exam is concerning for an incarcerated and possibly stangulated inguinal hernia.  IV fluids have been ordered.  Plan films and pain meds have been ordered.  Will consider NG tube.  IV hydration ordered for fluid resuscitation considering his hypotension and tachycardia.  I have consulted general surgery who will come see the patient in the ED.   The patient's electrolyte panel came back after he thought he been moved up to the operating room. We will call a coworker let them know of his electrolyte abnormalities. I suspect the etiology of his renal failure  is prerenal azotemia related to his severe nausea and vomiting.  IV hydration has been administered in the ED.      Celene Kras, MD 06/15/12 [phone]

## 2012-06-15 NOTE — ED Notes (Signed)
Pt has pain in severe  right  Lower abdomen and  Moderate abdominal pain. Pt vomiting green emises times 3 days.

## 2012-06-15 NOTE — H&P (Signed)
Shawn [name] is an 61 y.o. male.   Chief Complaint: nausea and vomiting and right groin pain HPI: patient is a 61 year old male who presents to the emergency room today due to persistent nausea and vomiting as well as right groin pain and swelling. He states that he had the onset of nausea and vomiting 4 days ago. Soon after that he noted a tender lump in his right groin. He is not sure if it was present prior to the nausea and vomiting. He has a history of left inguinal hernia repair 40 years ago but was not aware of a right inguinal hernia. The patient tried to get by at home and drink liquids but has had persistent bilious vomiting and persistent pain and swelling in the right groin and he presented to the emergency room today. He denies generalized abdominal pain. He has been feeling weak.  Past Medical History  Diagnosis Date  . Osteoarthritis X years    left knee.  Distant hx (age 50) "dislocated" knee and required surgery, then crush injury to patella required knee cap removal.  . Diverticular disease     "itis" x 2 episodes (last was about 2007)  . Tobacco dependence   . Multiple rib fractures 10/09/10    s/p fall down a ravine--9th and 10th on right, contusion of left 7th and 8th ribs  . Fracture of metatarsal bone(s), closed     3rd, 4th, 5th mid shaft on left foot  . History of substance abuse     cocaine, marijuana, acid, alcohol (in remission since 2010)  . Solar dermatitis     face and ears  . Bipolar disorder     Has had multiple psych hospitalization in the past, most recently at [facility] 01/31/11-02/04/11.  Says meds made him emotionally blunted. (lamictal and wellbutrin).  . Alcoholism     "Dry" since 2002  . Depression     Past Surgical History  Procedure Date  . Knee dislocation surgery 1966  . Patellectomy 1968    s/p crush injury  . Inguinal hernia repair     left  . Hernia repair     at 61 years of age  9  . Total knee arthroplasty 11/20/2011   Procedure: TOTAL KNEE ARTHROPLASTY;  Surgeon: Loreta Ave, MD;  Location: [facility] OR;  Service: Orthopedics;  Laterality: Left;  DR Eulah Pont WANTS FOR THIS CASE    Family History  Problem Relation Age of Onset  . Arthritis Mother   . Arthritis Father   . Cancer Brother     lung cancer.  Died in early 103s.   Social History:  reports that he has been smoking Cigarettes.  He has a 30 pack-year smoking history. He has never used smokeless tobacco. He reports that he drinks alcohol. He reports that he does not use illicit drugs.  Allergies:  Allergies  Allergen Reactions  . Codeine Nausea Only    Current Facility-Administered Medications  Medication Dose Route Frequency Provider Last Rate Last Dose  . 0.9 %  sodium chloride infusion  1,000 mL Intravenous Once Celene Kras, MD 1,000 mL/hr at 06/15/12 1217 1,000 mL at 06/15/12 1217   Followed by  . 0.9 %  sodium chloride infusion  1,000 mL Intravenous Once Celene Kras, MD 1,000 mL/hr at 06/15/12 1219 1,000 mL at 06/15/12 1219   Followed by  . 0.9 %  sodium chloride infusion  1,000 mL Intravenous Continuous Celene Kras, MD      .  morphine 4 MG/ML injection 4 mg  4 mg Intravenous Q15 min PRN Celene Kras, MD      . morphine 4 MG/ML injection        4 mg at 06/15/12 1213  . ondansetron (ZOFRAN) 4 MG/2ML injection           . ondansetron (ZOFRAN) injection 4 mg  4 mg Intravenous Once Celene Kras, MD   4 mg at 06/15/12 1214   Current Outpatient Prescriptions  Medication Sig Dispense Refill  . buPROPion (WELLBUTRIN SR) 150 MG 12 hr tablet Take 1 tablet (150 mg total) by mouth 2 (two) times daily.  60 tablet  3     No results found for this or any previous visit (from the past 48 hour(s)). No results found.  Review of Systems  Constitutional: Negative for fever and chills.  Respiratory: Negative for cough, sputum production, shortness of breath and wheezing.   Cardiovascular: Negative for chest pain, palpitations and leg swelling.    Gastrointestinal: Positive for nausea, vomiting and abdominal pain. Negative for diarrhea.  Musculoskeletal: Positive for joint pain.  Psychiatric/Behavioral: Positive for depression. The patient is nervous/anxious.     Blood pressure 89/64, pulse 114, temperature 97.8 F (36.6 C), temperature source Oral, resp. rate 18, SpO2 97.00%. Physical Exam  General: Thin, somewhat chronically ill-appearing white male in no acute distress Skin: Warm and dry without rash or infection HEENT: No palpable masses. Sclera nonicteric. Mucous membranes dry.  Lungs: Clear equal breath sounds bilaterally without increased work of breathing Cardiovascular: Regular tachycardia. No murmurs. No edema. Extremities well perfused. Abdomen: Mildly distended. There is a tender approximately 10 x 4 cm mass in the right groin consistent with an incarcerated hernia. Slight overlying erythema. No hernia palpable on the left. There is very minimal generalized abdominal tenderness without guarding. No masses or organomegaly. Extremities: No edema. Well perfused. Multiple healed incisions around the left knee with limited mobility Neurologic: He is alert and fully oriented and appropriate. Motor and sensory exam grossly normal.  Assessment/Plan Apparent incarcerated right inguinal hernia with small bowel obstruction. He is dehydrated. He may well have strangulated bowel. Laboratory and x-rays are in progress. He is receiving aggressive IV hydration. He will receive broad-spectrum IV antibiotics and be taken to the operating room for emergency exploration of the right groin.  We discussed repair of his right inguinal hernia and the likelihood of requiring a bowel resection which may require a second abdominal incision. We discussed the indications for the surgery and risks of anesthetic complications, bleeding, infection, recurrent hernia, chronic pain. He understands and agrees to proceed.  Harlow Carrizales T 06/15/2012, 12:42  PM

## 2012-06-15 NOTE — Transfer of Care (Signed)
Immediate Anesthesia Transfer of Care Note  Patient: Shawn [name]  Procedure(s) Performed: Procedure(s) (LRB): HERNIA REPAIR INGUINAL INCARCERATED (Right) SMALL BOWEL RESECTION (N/A)  Patient Location: PACU  Anesthesia Type: General  Level of Consciousness: sedated, patient cooperative and responds to stimulaton  Airway & Oxygen Therapy: Patient Spontanous Breathing and Patient connected to face mask oxgen  Post-op Assessment: Report given to PACU RN and Post -op Vital signs reviewed and stable  Post vital signs: Reviewed and stable  Complications: No apparent anesthesia complications

## 2012-06-16 ENCOUNTER — Encounter (HOSPITAL_COMMUNITY): Payer: Self-pay | Admitting: General Surgery

## 2012-06-16 DIAGNOSIS — N19 Unspecified kidney failure: Secondary | ICD-10-CM

## 2012-06-16 DIAGNOSIS — E876 Hypokalemia: Secondary | ICD-10-CM

## 2012-06-16 LAB — BASIC METABOLIC PANEL
BUN: 34 mg/dL — ABNORMAL HIGH (ref 6–23)
Chloride: 97 mEq/L (ref 96–112)
GFR calc Af Amer: 46 mL/min — ABNORMAL LOW (ref >90)
GFR calc non Af Amer: 40 mL/min — ABNORMAL LOW (ref >90)
Potassium: 3.4 mEq/L — ABNORMAL LOW (ref 3.5–5.1)
Sodium: 139 mEq/L (ref 135–145)

## 2012-06-16 LAB — CBC
HCT: 45.2 % (ref 39.0–52.0)
Hemoglobin: 15.7 g/dL (ref 13.0–17.0)
MCH: 32.4 pg (ref 26.0–34.0)
MCHC: 34.7 g/dL (ref 30.0–36.0)
MCV: 93.2 fL (ref 78.0–100.0)
RBC: 4.85 MIL/uL (ref 4.22–5.81)

## 2012-06-16 LAB — GLUCOSE, CAPILLARY: Glucose-Capillary: 105 mg/dL — ABNORMAL HIGH (ref 70–99)

## 2012-06-16 MED ORDER — POTASSIUM CHLORIDE 10 MEQ/100ML IV SOLN
10.0000 meq | INTRAVENOUS | Status: AC
  Administered 2012-06-16 (×4): 10 meq via INTRAVENOUS
  Filled 2012-06-16: qty 100

## 2012-06-16 MED ORDER — SODIUM CHLORIDE 0.9 % IV BOLUS (SEPSIS)
1000.0000 mL | Freq: Once | INTRAVENOUS | Status: AC
  Administered 2012-06-16: 1000 mL via INTRAVENOUS

## 2012-06-16 MED ORDER — SODIUM CHLORIDE 0.9 % IV BOLUS (SEPSIS)
500.0000 mL | Freq: Once | INTRAVENOUS | Status: AC
  Administered 2012-06-16: 500 mL via INTRAVENOUS

## 2012-06-16 MED ORDER — NICOTINE 21 MG/24HR TD PT24
21.0000 mg | MEDICATED_PATCH | Freq: Every day | TRANSDERMAL | Status: DC
  Administered 2012-06-16 – 2012-06-24 (×9): 21 mg via TRANSDERMAL
  Filled 2012-06-16 (×9): qty 1

## 2012-06-16 NOTE — Progress Notes (Signed)
Called MD on Call for [place]. Dr. Gerrit Friends regarding decrease in B/P 5:00 96/55 (63) 6:00 90/53 (62). Given verbal order for one time 500cc NaCl bolus.

## 2012-06-16 NOTE — Progress Notes (Signed)
CARE MANAGEMENT NOTE 06/16/2012  Patient:  Shawn [name], Shawn [name]   Account Number:  [id number]  Date Initiated:  06/16/2012  Documentation initiated by:  Penny Frisbie  Subjective/Objective Assessment:   ecg abnormal pre-op colon resection due to incarerated bowel, hypotensive post op     Action/Plan:   home   Anticipated DC Date:  06/19/2012   Anticipated DC Plan:  HOME/SELF CARE  In-house referral  NA      DC Planning Services  NA      [facility] Choice  NA   Choice offered to / List presented to:  NA   DME arranged  NA      DME agency  NA     HH arranged  NA      HH agency  NA   Status of service:  In process, will continue to follow Medicare Important Message given?  NA - LOS <3 / Initial given by admissions (If response is "NO", the following Medicare IM given date fields will be blank) Date Medicare IM given:   Date Additional Medicare IM given:    Discharge Disposition:    Per UR Regulation:  Reviewed for med. necessity/level of care/duration of stay  If discussed at Long Length of Stay Meetings, dates discussed:    Comments:  [id number]/Kentrell Guettler Earlene Plater, RN, BSN, CCM: CHART REVIEWED AND UPDATED. NO DISCHARGE NEEDS PRESENT AT THIS TIME. CASE MANAGEMENT [phone]

## 2012-06-16 NOTE — Progress Notes (Signed)
Patient ID: Shawn [name], male   DOB: 1951/05/19, 61 y.o.   MRN: [id number] 1 Day Post-Op  Subjective: Pt feels ok this morning, some soreness.  No flatus.  Objective: Vital signs in last 24 hours: Temp:  [97.6 F (36.4 C)-98.5 F (36.9 C)] 97.6 F (36.4 C) (10/15 0400) Pulse Rate:  [81-114] 89  (10/15 0700) Resp:  [13-27] 21  (10/15 0700) BP: (89-149)/(52-78) 107/52 mmHg (10/15 0700) SpO2:  [96 %-100 %] 97 % (10/15 0700) Weight:  [170 lb 3.1 oz (77.2 kg)] 170 lb 3.1 oz (77.2 kg) (10/14 1842) Last BM Date: 06/14/12  Intake/Output from previous day: 10/14 0701 - 10/15 0700 In: 6250 [I.V.:5200; IV Piggyback:1050] Out: 3635 [Urine:2525; Emesis/NG output:760] Intake/Output this shift:    PE: Abd: soft, tender appropriately, -BS, ND, incisions c/d/i with staples.  NGT with bilious output Heart: regular Lungs: CTAB  Lab Results:   Basename 06/16/12 0340 06/15/12 1232  WBC 4.3 5.0  HGB 15.7 18.0*  HCT 45.2 50.5  PLT 243 326   BMET  Basename 06/16/12 0340 06/15/12 2002  NA 139 137  K 3.4* 3.0*  CL 97 95*  CO2 34* 35*  GLUCOSE 133* 142*  BUN 34* 41*  CREATININE 1.74* 2.47*  CALCIUM 7.4* 7.2*   PT/INR No results found for this basename: LABPROT:2,INR:2 in the last 72 hours CMP     Component Value Date/Time   NA 139 06/16/2012 0340   K 3.4* 06/16/2012 0340   CL 97 06/16/2012 0340   CO2 34* 06/16/2012 0340   GLUCOSE 133* 06/16/2012 0340   BUN 34* 06/16/2012 0340   CREATININE 1.74* 06/16/2012 0340   CREATININE 0.76 09/04/2011 1130   CALCIUM 7.4* 06/16/2012 0340   PROT 6.9 06/15/2012 1232   ALBUMIN 3.5 06/15/2012 1232   AST 11 06/15/2012 1232   ALT 7 06/15/2012 1232   ALKPHOS 72 06/15/2012 1232   BILITOT 0.5 06/15/2012 1232   GFRNONAA 40* 06/16/2012 0340   GFRAA 46* 06/16/2012 0340   Lipase     Component Value Date/Time   LIPASE 8* 06/15/2012 1232       Studies/Results: Dg Abd Acute W/chest  06/15/2012  *RADIOLOGY REPORT*  Clinical Data: Weakness.   Nausea, vomiting.  Abdominal pain.  ACUTE ABDOMEN SERIES (ABDOMEN 2 VIEW & CHEST 1 VIEW)  Comparison: Chest x-ray 11/13/2011  Findings: Heart is mildly enlarged.  There are no focal consolidations or pleural effusions.  No pulmonary edema.  Supine and left lateral decubitus views of the abdomen show marked dilatation of small bowel loops which measure 5.8 cm.  There is overall paucity of large bowel gas.  Air fluid levels are identified within the dilated small bowel loops.  No free intraperitoneal air.  IMPRESSION:  1.  Cardiomegaly without pulmonary edema. 2.  Findings consistent with high-grade partial or early small bowel obstruction.   Original Report Authenticated By: Patterson Hammersmith, M.D.     Anti-infectives: Anti-infectives     Start     Dose/Rate Route Frequency Ordered Stop   06/15/12 2200   cefOXitin (MEFOXIN) 2 g in dextrose 5 % 50 mL IVPB        2 g 100 mL/hr over 30 Minutes Intravenous 3 times per day 06/15/12 1851     06/15/12 1300   cefOXitin (MEFOXIN) 2 g in dextrose 5 % 50 mL IVPB        2 g 100 mL/hr over 30 Minutes Intravenous STAT 06/15/12 1252 06/15/12 1403  Assessment/Plan  1. Incarcerated femoral hernia with ischemic bowel 2. S/p ex lap with SBR and repair of femoral hernia 3. ARF, improving 4. Hypotension 5. Post op ileus 6. Tobacco abuse 7. Hypokalemia  Plan: 1. Will leave in SDU today to monitor BP since he is mildly hypotensive and just required a 500cc bolus for his pressure.  Cont to monitor that today. 2. Follow Cr.  This is much improved with hydration.  Cont hydration for now. 3. Nicotine patch for tobacco abuse 4. Dc foley today 5. OOB and ambulate in halls TID 6. Cont NGT and await bowel function 7. Replace K+ 8. Check labs in the morning.   LOS: 1 day    Shawn [name] E 06/16/2012, 7:34 AM Pager: [phone]

## 2012-06-17 LAB — CBC
Hemoglobin: 15.4 g/dL (ref 13.0–17.0)
MCH: 32 pg (ref 26.0–34.0)
Platelets: 206 10*3/uL (ref 150–400)
RBC: 4.82 MIL/uL (ref 4.22–5.81)
WBC: 5.6 10*3/uL (ref 4.0–10.5)

## 2012-06-17 LAB — URINALYSIS, ROUTINE W REFLEX MICROSCOPIC
Glucose, UA: NEGATIVE mg/dL
Ketones, ur: NEGATIVE mg/dL
Leukocytes, UA: NEGATIVE
pH: 8 (ref 5.0–8.0)

## 2012-06-17 LAB — BASIC METABOLIC PANEL
CO2: 30 mEq/L (ref 19–32)
Calcium: 7.4 mg/dL — ABNORMAL LOW (ref 8.4–10.5)
GFR calc non Af Amer: 87 mL/min — ABNORMAL LOW (ref >90)
Glucose, Bld: 135 mg/dL — ABNORMAL HIGH (ref 70–99)
Potassium: 3.2 mEq/L — ABNORMAL LOW (ref 3.5–5.1)
Sodium: 138 mEq/L (ref 135–145)

## 2012-06-17 MED ORDER — SODIUM CHLORIDE 0.9 % IV BOLUS (SEPSIS)
500.0000 mL | Freq: Once | INTRAVENOUS | Status: AC
  Administered 2012-06-17: 500 mL via INTRAVENOUS

## 2012-06-17 MED ORDER — LORAZEPAM 2 MG/ML IJ SOLN
1.0000 mg | Freq: Four times a day (QID) | INTRAMUSCULAR | Status: DC | PRN
  Administered 2012-06-17: 1 mg via INTRAVENOUS
  Filled 2012-06-17: qty 1

## 2012-06-17 MED ORDER — LORAZEPAM 1 MG PO TABS: 1.0000 mg | ORAL_TABLET | Freq: Four times a day (QID) | ORAL | Status: DC | PRN

## 2012-06-17 MED ORDER — HALOPERIDOL LACTATE 5 MG/ML IJ SOLN
INTRAMUSCULAR | Status: AC
  Filled 2012-06-17: qty 1

## 2012-06-17 MED ORDER — POTASSIUM CHLORIDE 10 MEQ/100ML IV SOLN
10.0000 meq | INTRAVENOUS | Status: AC
  Administered 2012-06-17 (×3): 10 meq via INTRAVENOUS
  Filled 2012-06-17: qty 400

## 2012-06-17 MED ORDER — DEXTROSE 5 % IN LACTATED RINGERS IV BOLUS
1000.0000 mL | Freq: Once | INTRAVENOUS | Status: AC
  Administered 2012-06-17: 1000 mL via INTRAVENOUS

## 2012-06-17 MED ORDER — HALOPERIDOL LACTATE 5 MG/ML IJ SOLN
5.0000 mg | Freq: Four times a day (QID) | INTRAMUSCULAR | Status: DC | PRN
  Administered 2012-06-17: 5 mg via INTRAVENOUS

## 2012-06-17 MED ORDER — POTASSIUM CHLORIDE 2 MEQ/ML IV SOLN
INTRAVENOUS | Status: DC
  Administered 2012-06-17 – 2012-06-22 (×9): via INTRAVENOUS
  Filled 2012-06-17 (×19): qty 1000

## 2012-06-17 NOTE — Progress Notes (Signed)
Patient ID: Shawn [name], male   DOB: 1951-03-21, 61 y.o.   MRN: [id number] 2 Days Post-Op  Subjective: Pt is quite sedated after being placed on Haldol and CIWA protocol overnight for confusion and delirium.  He thinks its 1913 now, but knows the president, and thinks he's at [place].  No flatus yet  Objective: Vital signs in last 24 hours: Temp:  [98.1 F (36.7 C)-99.9 F (37.7 C)] 99.9 F (37.7 C) (10/16 0800) Pulse Rate:  [42-130] 113  (10/16 0700) Resp:  [14-25] 21  (10/16 0700) BP: (90-149)/(41-88) 94/41 mmHg (10/16 0700) SpO2:  [51 %-100 %] 97 % (10/16 0700) Last BM Date: 06/14/12  Intake/Output from previous day: 10/15 0701 - 10/16 0700 In: 5950 [I.V.:3450; IV Piggyback:2500] Out: 2765 [Urine:1565; Emesis/NG output:1200] Intake/Output this shift:    PE: Abd: soft, tender, -BS, incisions are c/d/i with staples, NGT with bilious output Ht: tachy Lungs: CTAB  Lab Results:   Basename 06/17/12 0630 06/16/12 0340  WBC 5.6 4.3  HGB 15.4 15.7  HCT 46.6 45.2  PLT 206 243   BMET  Basename 06/17/12 0630 06/16/12 0340  NA 138 139  K 3.2* 3.4*  CL 99 97  CO2 30 34*  GLUCOSE 135* 133*  BUN 20 34*  CREATININE 0.95 1.74*  CALCIUM 7.4* 7.4*   PT/INR No results found for this basename: LABPROT:2,INR:2 in the last 72 hours CMP     Component Value Date/Time   NA 138 06/17/2012 0630   K 3.2* 06/17/2012 0630   CL 99 06/17/2012 0630   CO2 30 06/17/2012 0630   GLUCOSE 135* 06/17/2012 0630   BUN 20 06/17/2012 0630   CREATININE 0.95 06/17/2012 0630   CREATININE 0.76 09/04/2011 1130   CALCIUM 7.4* 06/17/2012 0630   PROT 6.9 06/15/2012 1232   ALBUMIN 3.5 06/15/2012 1232   AST 11 06/15/2012 1232   ALT 7 06/15/2012 1232   ALKPHOS 72 06/15/2012 1232   BILITOT 0.5 06/15/2012 1232   GFRNONAA 87* 06/17/2012 0630   GFRAA >90 06/17/2012 0630   Lipase     Component Value Date/Time   LIPASE 8* 06/15/2012 1232       Studies/Results: Dg Abd Acute W/chest  06/15/2012   *RADIOLOGY REPORT*  Clinical Data: Weakness.  Nausea, vomiting.  Abdominal pain.  ACUTE ABDOMEN SERIES (ABDOMEN 2 VIEW & CHEST 1 VIEW)  Comparison: Chest x-ray 11/13/2011  Findings: Heart is mildly enlarged.  There are no focal consolidations or pleural effusions.  No pulmonary edema.  Supine and left lateral decubitus views of the abdomen show marked dilatation of small bowel loops which measure 5.8 cm.  There is overall paucity of large bowel gas.  Air fluid levels are identified within the dilated small bowel loops.  No free intraperitoneal air.  IMPRESSION:  1.  Cardiomegaly without pulmonary edema. 2.  Findings consistent with high-grade partial or early small bowel obstruction.   Original Report Authenticated By: Patterson Hammersmith, M.D.     Anti-infectives: Anti-infectives     Start     Dose/Rate Route Frequency Ordered Stop   06/15/12 2200   cefOXitin (MEFOXIN) 2 g in dextrose 5 % 50 mL IVPB        2 g 100 mL/hr over 30 Minutes Intravenous 3 times per day 06/15/12 1851     06/15/12 1300   cefOXitin (MEFOXIN) 2 g in dextrose 5 % 50 mL IVPB        2 g 100 mL/hr over 30 Minutes Intravenous STAT 06/15/12  1252 06/15/12 1403           Assessment/Plan  1. Incarcerated right femoral hernia with ischemic bowel 2. S/p ex lap with SBR and repair of femoral hernia 3. ARF, resolved 4. Hypokalemia 5. Delirium  Plan: 1. Patient is still tachycardic and was hypotensive upon my arrival.  BP was 87/55.  Will give a 500cc bolus as patient tends to respond to the fluid boluses he gets.  Suspect he is just really depleted give nausea and vomiting on admission. 2. Cont NGT and await bowel function 3. Replace K, K added to IVFs 4. Cont Haldol and CIWA protocol for delirium.  Patient has h/o ETOH abuse. 5. Keep in ICU/SDU for now     LOS: 2 days    [name] E 06/17/2012, 9:45 AM Pager: [phone]

## 2012-06-17 NOTE — Progress Notes (Signed)
Paged MD on call regarding B/P of 95/58 (66) and the start of delirium. QTc 0.52 Haldol not an option.  Dr. Michaell Cowing gave verbal orders for 1000cc bolus LR & 5% Dextrose, and to start CIWA protocol in case pt had recently stopped ETOH abuse just prior to surgery. Pt does have ETOH abuse in hx.

## 2012-06-17 NOTE — Progress Notes (Signed)
Paged MD on call concerning delirium, pt feels he is at the bar, with chickens outside, trying to get out of bed to go to the bank.   Dr. Michaell Cowing gave verbal order for 5mg  haldol q6.

## 2012-06-17 NOTE — Progress Notes (Signed)
Patient interviewed and examined, agree with PA note above. He is much calmer currently Abdomen seems benign I agree he seems behind on fluids  Mariella Saa MD, FACS  06/17/2012 10:19 AM

## 2012-06-17 NOTE — Progress Notes (Signed)
Patient interviewed and examined, agree with PA note above.  Mariella Saa MD, FACS  06/17/2012 8:33 AM

## 2012-06-18 LAB — CBC
MCH: 31.7 pg (ref 26.0–34.0)
MCV: 96.6 fL (ref 78.0–100.0)
Platelets: 238 10*3/uL (ref 150–400)
RDW: 14.3 % (ref 11.5–15.5)

## 2012-06-18 LAB — URINE CULTURE: Colony Count: NO GROWTH

## 2012-06-18 LAB — BASIC METABOLIC PANEL
Calcium: 7.7 mg/dL — ABNORMAL LOW (ref 8.4–10.5)
Creatinine, Ser: 0.84 mg/dL (ref 0.50–1.35)
GFR calc Af Amer: >90 mL/min (ref >90)

## 2012-06-18 NOTE — Anesthesia Postprocedure Evaluation (Signed)
  Anesthesia Post-op Note  Patient: Shawn [name]  Procedure(s) Performed: Procedure(s) (LRB): HERNIA REPAIR INGUINAL INCARCERATED (Right) SMALL BOWEL RESECTION (N/A)  Patient Location: PACU  Anesthesia Type: General  Level of Consciousness: awake and alert   Airway and Oxygen Therapy: Patient Spontanous Breathing  Post-op Pain: mild  Post-op Assessment: Post-op Vital signs reviewed, Patient's Cardiovascular Status Stable, Respiratory Function Stable, Patent Airway and No signs of Nausea or vomiting  Post-op Vital Signs: stable  Complications: No apparent anesthesia complications. Received KCl for low potassium. To Stepdown unit per Dr. Johna Sheriff.

## 2012-06-18 NOTE — Progress Notes (Signed)
Patient ID: Shawn [name], male   DOB: [birth year], 61 y.o.   MRN: [id number] 3 Days Post-Op  Subjective: Pt is sleepy.  No flatus.  Hasn't mobilized yet.  Has condom cath on.  Objective: Vital signs in last 24 hours: Temp:  [98.3 F (36.8 C)-99.3 F (37.4 C)] 99.2 F (37.3 C) (10/17 0800) Pulse Rate:  [84-104] 100  (10/17 0800) Resp:  [10-28] 27  (10/17 0800) BP: (90-126)/(52-65) 121/63 mmHg (10/17 0800) SpO2:  [92 %-100 %] 96 % (10/17 0800) Last BM Date: 06/14/12  Intake/Output from previous day: 10/16 0701 - 10/17 0700 In: 3887.5 [I.V.:3177.5; NG/GT:60; IV Piggyback:650] Out: 4485 [Urine:2110; Emesis/NG output:2375] Intake/Output this shift: Total I/O In: 150 [I.V.:150] Out: -   PE: Abd: soft, tender appropriately, -BS, NGt with copious amounts of bilious drainage.   Heart: regular Lungs: decreased BS  Lab Results:   Basename 06/18/12 0312 06/17/12 0630  WBC 6.4 5.6  HGB 14.7 15.4  HCT 44.8 46.6  PLT 238 206   BMET  Basename 06/18/12 0312 06/17/12 0630  NA 139 138  K 3.5 3.2*  CL 103 99  CO2 31 30  GLUCOSE 147* 135*  BUN 14 20  CREATININE 0.84 0.95  CALCIUM 7.7* 7.4*   PT/INR No results found for this basename: LABPROT:2,INR:2 in the last 72 hours CMP     Component Value Date/Time   NA 139 06/18/2012 0312   K 3.5 06/18/2012 0312   CL 103 06/18/2012 0312   CO2 31 06/18/2012 0312   GLUCOSE 147* 06/18/2012 0312   BUN 14 06/18/2012 0312   CREATININE 0.84 06/18/2012 0312   CREATININE 0.76 09/04/2011 1130   CALCIUM 7.7* 06/18/2012 0312   PROT 6.9 06/15/2012 1232   ALBUMIN 3.5 06/15/2012 1232   AST 11 06/15/2012 1232   ALT 7 06/15/2012 1232   ALKPHOS 72 06/15/2012 1232   BILITOT 0.5 06/15/2012 1232   GFRNONAA >90 06/18/2012 0312   GFRAA >90 06/18/2012 0312   Lipase     Component Value Date/Time   LIPASE 8* 06/15/2012 1232       Studies/Results: No results found.  Anti-infectives: Anti-infectives     Start     Dose/Rate Route Frequency  Ordered Stop   06/15/12 2200   cefOXitin (MEFOXIN) 2 g in dextrose 5 % 50 mL IVPB        2 g 100 mL/hr over 30 Minutes Intravenous 3 times per day 06/15/12 1851     06/15/12 1300   cefOXitin (MEFOXIN) 2 g in dextrose 5 % 50 mL IVPB        2 g 100 mL/hr over 30 Minutes Intravenous STAT 06/15/12 1252 06/15/12 1403           Assessment/Plan  1. S/p ex lap with SBR for incarcerated femoral hernia with ischemic bowel 2. Post op ileus 3. Delirium, ? Related to ETOH withdrawal 4. Deconditioning  Plan: 1. Cont NGT, will limit ice chips as NGT output went up over 1 L from yesterday to today. 2. Patient must get up and mobilize.  He is hardly able to get IS up to 500 and is unable to cough well enough to get up secretions!  3. Patient needs to try and get up to urinate instead of using a condom catheter.  If too sedated secondary to CIWA, then need to back off of ativan being given. 4. PT/OT eval.  LOS: 3 days    Gilford Lardizabal E 06/18/2012, 10:07 AM Pager: [phone]

## 2012-06-18 NOTE — Progress Notes (Signed)
Patient interviewed and examined, agree with PA note above.  He is now more alert and conversant as I am seeing him. His abdomen is soft with minimal incisional tenderness. Vital signs stable. He seems somewhat improved from yesterday.  Mariella Saa MD, FACS  06/18/2012 5:08 PM

## 2012-06-19 MED ORDER — MORPHINE SULFATE 2 MG/ML IJ SOLN
1.0000 mg | INTRAMUSCULAR | Status: DC | PRN
Start: 2012-06-19 — End: 2012-06-24
  Administered 2012-06-19 – 2012-06-20 (×2): 2 mg via INTRAVENOUS
  Filled 2012-06-19 (×2): qty 1

## 2012-06-19 MED ORDER — KCL IN DEXTROSE-NACL 20-5-0.45 MEQ/L-%-% IV SOLN
INTRAVENOUS | Status: AC
  Administered 2012-06-19: 1000 mL
  Filled 2012-06-19: qty 1000

## 2012-06-19 NOTE — Evaluation (Signed)
Occupational Therapy Evaluation Patient Details Name: Shawn [name] MRN: [id number] DOB: [birth year] Today's Date: 06/19/2012 Time: 0912-0933 OT Time Calculation (min): 21 min  OT Assessment / Plan / Recommendation Clinical Impression  This 61 year old man was admitted for exploratory lap for incarcerated femoral hernia/ischedmic bowel. Pt had a L TKA done in March and has mild ROM limitations He is deconditioned and would benefit from skilled OT.  At baseline, he lives alone and is independent.  Goals are min A for acute care.      OT Assessment  Patient needs continued OT Services    Follow Up Recommendations  Skilled nursing facility    Barriers to Discharge      Equipment Recommendations  Rolling walker with 5" wheels    Recommendations for Other Services    Frequency  Min 2X/week    Precautions / Restrictions Precautions Precautions: Fall Restrictions Weight Bearing Restrictions: No   Pertinent Vitals/Pain Uncomfortable when standing as erect as possible    ADL  Grooming: Performed;Wash/dry hands;Supervision/safety Where Assessed - Grooming: Supported standing Upper Body Bathing: Simulated;Set up Where Assessed - Upper Body Bathing: Supported sitting Lower Body Bathing: Simulated;Moderate assistance Where Assessed - Lower Body Bathing: Supported sit to stand Upper Body Dressing: Simulated;Minimal assistance (lines) Where Assessed - Upper Body Dressing: Supported sitting Lower Body Dressing: Simulated;Moderate assistance Where Assessed - Lower Body Dressing: Supported sit to Pharmacist, hospital: Performed;Moderate assistance;Minimal assistance (min A to sit down:  mod to stand up; min A steps) Toilet Transfer Method: Sit to stand Toilet Transfer Equipment: Comfort height toilet;Raised toilet seat without arms Toileting - Clothing Manipulation and Hygiene: Performed;Set up;Supervision/safety Where Assessed - Toileting Clothing Manipulation and Hygiene: Sit on  3-in-1 or toilet;Lean right and/or left Transfers/Ambulation Related to ADLs: Min A ambulating.  Pt tends to forward flex trunk ADL Comments: Would benefit from AE but did not introduce today    OT Diagnosis: Generalized weakness  OT Problem List: Decreased strength;Decreased activity tolerance;Decreased knowledge of use of DME or AE;Impaired balance (sitting and/or standing) OT Treatment Interventions: Self-care/ADL training;Energy conservation;DME and/or AE instruction;Therapeutic activities;Patient/family education;Balance training   OT Goals Acute Rehab OT Goals OT Goal Formulation: With patient Time For Goal Achievement: 07/03/12 Potential to Achieve Goals: Good ADL Goals Pt Will Perform Lower Body Bathing: with min assist;Sit to stand from chair;with adaptive equipment ADL Goal: Lower Body Bathing - Progress: Goal set today Pt Will Perform Lower Body Dressing: with min assist;Sit to stand from chair;with adaptive equipment ADL Goal: Lower Body Dressing - Progress: Goal set today Pt Will Transfer to Toilet: with supervision;Comfort height toilet;Ambulation ADL Goal: Toilet Transfer - Progress: Goal set today Pt Will Perform Toileting - Clothing Manipulation: with modified independence;Sitting on 3-in-1 or toilet;Standing ADL Goal: Toileting - Clothing Manipulation - Progress: Goal set today Miscellaneous OT Goals Miscellaneous OT Goal #1: Pt will verbalize 3 energy conservation techniques OT Goal: Miscellaneous Goal #1 - Progress: Goal set today  Visit Information  Last OT Received On: 06/19/12 Assistance Needed: +1 PT/OT Co-Evaluation/Treatment: Yes    Subjective Data  Subjective: I'm going to run a marathon Patient Stated Goal: get stronger: pt works at parts and Air traffic controller   Prior [employer] Living Lives With: Alone Type of Home: House Home Access: Level entry [employer] Shower/Tub: Health visitor: Handicapped height Prior  Function Level of Independence: Independent Vocation: Full time employment 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 Communication Communication: No difficulties Dominant Hand: Right  Vision/Perception     Cognition  Overall Cognitive Status: Appears within functional limits for tasks assessed/performed Arousal/Alertness: Awake/alert Orientation Level: Appears intact for tasks assessed Behavior During Session: [facility] for tasks performed    Extremity/Trunk Assessment Right Upper Extremity Assessment RUE ROM/Strength/Tone: [facility] for tasks assessed Left Upper Extremity Assessment LUE ROM/Strength/Tone: WFL for tasks assessed Right Lower Extremity Assessment RLE ROM/Strength/Tone: Within functional levels RLE Sensation: WFL - Light Touch RLE Coordination: WFL - gross/fine motor Left Lower Extremity Assessment LLE ROM/Strength/Tone: Deficits LLE ROM/Strength/Tone Deficits: knee flexion approx 85* AROM, 2* TKA several months ago and other prior knee surgeries LLE Sensation: WFL - Light Touch LLE Coordination: WFL - gross/fine motor Trunk Assessment Trunk Assessment: Kyphotic (also forward flexes at hips)     Mobility Bed Mobility Bed Mobility: Not assessed Transfers Sit to Stand: From chair/3-in-1;From toilet;4: Min assist;With upper extremity assist;With armrests Stand to Sit: To chair/3-in-1;To toilet;4: Min assist;With upper extremity assist Details for Transfer Assistance: needed mod A to stand up from low commode, with grab bar     Shoulder Instructions     Exercise     Balance     End of Session OT - End of Session Activity Tolerance: Patient tolerated treatment well Patient left:  (in w/c with call bell within reach:  transferring floors)  GO     Palmira Stickle 06/19/2012, 10:09 AM Marica Otter, OTR/L [phone] 06/19/2012

## 2012-06-19 NOTE — Progress Notes (Signed)
Patient interviewed and examined, agree with PA note above. Abdomen very benign on exam. Since PA evaluation he has been walking and states he had a little flatus. Wounds were serous drainage but clean. Mariella Saa MD, FACS  06/19/2012 8:00 PM

## 2012-06-19 NOTE — Progress Notes (Signed)
Patient ID: Shawn [name], male   DOB: Jan 06, 1951, 61 y.o.   MRN: [id number] 4 Days Post-Op  Subjective: Pt is still sleepy, but a little more alert than yesterday morning.  Got 1000 on IS at first try, then only 500.  Still hasn't gotten out of bed yet.  No flatus  Objective: Vital signs in last 24 hours: Temp:  [97.8 F (36.6 C)-99.6 F (37.6 C)] 99 F (37.2 C) (10/18 0400) Pulse Rate:  [82-100] 82  (10/18 0400) Resp:  [18-30] 22  (10/18 0400) BP: (116-128)/(63-74) 116/63 mmHg (10/18 0400) SpO2:  [93 %-98 %] 96 % (10/18 0400) Weight:  [167 lb 1.7 oz (75.8 kg)] 167 lb 1.7 oz (75.8 kg) (10/18 0400) Last BM Date: 06/14/12  Intake/Output from previous day: 10/17 0701 - 10/18 0700 In: 2600 [I.V.:2150; NG/GT:140; IV Piggyback:50] Out: 4035 [Urine:1010; Emesis/NG output:3025] Intake/Output this shift:    PE: Abd: soft, incisions are both red.  All staples removed with copious foul smelling purulent drainage.  WD dressings were replaced.  -BS, ND  Lab Results:   Basename 06/18/12 0312 06/17/12 0630  WBC 6.4 5.6  HGB 14.7 15.4  HCT 44.8 46.6  PLT 238 206   BMET  Basename 06/18/12 0312 06/17/12 0630  NA 139 138  K 3.5 3.2*  CL 103 99  CO2 31 30  GLUCOSE 147* 135*  BUN 14 20  CREATININE 0.84 0.95  CALCIUM 7.7* 7.4*   PT/INR No results found for this basename: LABPROT:2,INR:2 in the last 72 hours CMP     Component Value Date/Time   NA 139 06/18/2012 0312   K 3.5 06/18/2012 0312   CL 103 06/18/2012 0312   CO2 31 06/18/2012 0312   GLUCOSE 147* 06/18/2012 0312   BUN 14 06/18/2012 0312   CREATININE 0.84 06/18/2012 0312   CREATININE 0.76 09/04/2011 1130   CALCIUM 7.7* 06/18/2012 0312   PROT 6.9 06/15/2012 1232   ALBUMIN 3.5 06/15/2012 1232   AST 11 06/15/2012 1232   ALT 7 06/15/2012 1232   ALKPHOS 72 06/15/2012 1232   BILITOT 0.5 06/15/2012 1232   GFRNONAA >90 06/18/2012 0312   GFRAA >90 06/18/2012 0312   Lipase     Component Value Date/Time   LIPASE 8*  06/15/2012 1232       Studies/Results: No results found.  Anti-infectives: Anti-infectives     Start     Dose/Rate Route Frequency Ordered Stop   06/15/12 2200   cefOXitin (MEFOXIN) 2 g in dextrose 5 % 50 mL IVPB        2 g 100 mL/hr over 30 Minutes Intravenous 3 times per day 06/15/12 1851     06/15/12 1300   cefOXitin (MEFOXIN) 2 g in dextrose 5 % 50 mL IVPB        2 g 100 mL/hr over 30 Minutes Intravenous STAT 06/15/12 1252 06/15/12 1403           Assessment/Plan  1. 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 Post op ileus, with high NGT output 3. Wound infections  Plan: 1. Patient must get OOB today! 2. Encourage IS 3. BID WD dressing changes to both wounds 4. Cont NGT and bowel rest.  Ice chips were limited yesterday.  Unsure why NGT output continues to go up to approximately 3L/ 24h. 5. Cont mefoxin, may need to change, but ok for now 6. Recheck labs in the morning 7.  Transfer to a floor. 8. PT/OT  LOS: 4 days    Jessalyn Hinojosa E 06/19/2012, 7:59 AM Pager: [phone]

## 2012-06-19 NOTE — Progress Notes (Signed)
Utilization review completed.  

## 2012-06-19 NOTE — Evaluation (Signed)
Physical Therapy Evaluation Patient Details Name: Shawn [name] MRN: [id number] DOB: 1951-03-16 Today's Date: 06/19/2012 Time: 0912-0933 PT Time Calculation (min): 21 min  PT Assessment / Plan / Recommendation Clinical Impression  61 y.o. male admitted with R inguinal hernia, s/p surgical repair. Pt ambulated 44' with RW and min/guard assist. Would benefit from acute PT to maximize safety and independence with mobility.  24* assist/supervision recommended upon DC, pt unsure if he can arrange in home help. HHPT vs ST-SNF recommended.     PT Assessment  Patient needs continued PT services    Follow Up Recommendations  Home health PT;Supervision/Assistance - 24 hour    Does the patient have the potential to tolerate intense rehabilitation      Barriers to Discharge Decreased caregiver support      Equipment Recommendations  Rolling walker with 5" wheels    Recommendations for Other Services OT consult   Frequency Min 3X/week    Precautions / Restrictions Precautions Precautions: Fall Restrictions Weight Bearing Restrictions: No   Pertinent Vitals/Pain **pt denies pain*      Mobility  Bed Mobility Bed Mobility: Not assessed Transfers Transfers: Sit to Stand;Stand to Sit Sit to Stand: From chair/3-in-1;From toilet;4: Min assist;With upper extremity assist;With armrests Stand to Sit: To chair/3-in-1;To toilet;4: Min assist;With upper extremity assist Ambulation/Gait Ambulation/Gait Assistance: 4: Min guard Ambulation Distance (Feet): 80 Feet Assistive device: Rolling walker Gait Pattern: Trunk flexed General Gait Details: VCs for flexed trunk and neck, pt able to partially correct posture    Shoulder Instructions     Exercises     PT Diagnosis: Generalized weakness  PT Problem List: Decreased activity tolerance;Decreased mobility PT Treatment Interventions: Gait training;Functional mobility training;Patient/family education;Therapeutic activities;Therapeutic  exercise   PT Goals Acute Rehab PT Goals PT Goal Formulation: With patient Time For Goal Achievement: 07/03/12 Potential to Achieve Goals: Good Pt will go Supine/Side to Sit: Independently PT Goal: Supine/Side to Sit - Progress: Goal set today Pt will go Sit to Stand: with modified independence;with upper extremity assist PT Goal: Sit to Stand - Progress: Goal set today Pt will Ambulate: >150 feet;with modified independence;with least restrictive assistive device PT Goal: Ambulate - Progress: Goal set today Pt will Perform Home Exercise Program: Independently PT Goal: Perform Home Exercise Program - Progress: Goal set today  Visit Information  Last PT Received On: 06/19/12 Assistance Needed: +1 PT/OT Co-Evaluation/Treatment: Yes    Subjective Data  Subjective: I want to run a marathon.  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 Communication Communication: No difficulties Dominant Hand: Right    Cognition  Overall Cognitive Status: Appears within functional limits for tasks assessed/performed Arousal/Alertness: Awake/alert Orientation Level: Appears intact for tasks assessed Behavior During Session: [facility] for tasks performed    Extremity/Trunk Assessment Right Upper Extremity Assessment RUE ROM/Strength/Tone: [facility] for tasks assessed Left Upper Extremity Assessment LUE ROM/Strength/Tone: WFL for tasks assessed Right Lower Extremity Assessment RLE ROM/Strength/Tone: Within functional levels RLE Sensation: WFL - Light Touch RLE Coordination: WFL - gross/fine motor Left Lower Extremity Assessment LLE ROM/Strength/Tone: Deficits LLE ROM/Strength/Tone Deficits: knee  flexion approx 85* AROM, 2* TKA several months ago and other prior knee surgeries LLE Sensation: WFL - Light Touch LLE Coordination: WFL - gross/fine motor Trunk Assessment Trunk Assessment: Kyphotic  Balance    End of Session PT - End of Session Equipment Utilized During Treatment: Gait belt Activity Tolerance: Patient tolerated treatment well Patient left: in chair Nurse Communication: Mobility status  GP     Ralene Bathe Kistler 06/19/2012, 9:47 AM  [phone]

## 2012-06-20 LAB — CBC
Platelets: 263 10*3/uL (ref 150–400)
RDW: 13.8 % (ref 11.5–15.5)
WBC: 8.6 10*3/uL (ref 4.0–10.5)

## 2012-06-20 LAB — BASIC METABOLIC PANEL
Chloride: 103 mEq/L (ref 96–112)
Creatinine, Ser: 0.74 mg/dL (ref 0.50–1.35)
GFR calc Af Amer: >90 mL/min (ref >90)
Potassium: 3.2 mEq/L — ABNORMAL LOW (ref 3.5–5.1)

## 2012-06-20 MED ORDER — POTASSIUM CHLORIDE 10 MEQ/100ML IV SOLN
10.0000 meq | INTRAVENOUS | Status: AC
  Administered 2012-06-20 (×2): 10 meq via INTRAVENOUS
  Filled 2012-06-20 (×2): qty 100

## 2012-06-20 NOTE — Progress Notes (Signed)
5 Days Post-Op  Subjective: Passing gas  Objective: Vital signs in last 24 hours: Temp:  [97.6 F (36.4 C)-99.6 F (37.6 C)] 99.5 F (37.5 C) (10/19 4540) Pulse Rate:  [81-97] 96  (10/19 0611) Resp:  [16-25] 18  (10/19 0611) BP: (118-134)/(69-79) 121/79 mmHg (10/19 0611) SpO2:  [93 %-96 %] 93 % (10/19 0611) Last BM Date: 06/14/12  Intake/Output from previous day: 10/18 0701 - 10/19 0700 In: 3047.9 [I.V.:2947.9; IV Piggyback:100] Out: 2625 [Urine:1225; Emesis/NG output:1400] Intake/Output this shift:    Incision/Wound:open and clean.  Abdomen soft non tender.  Non distended.  Lab Results:   Basename 06/20/12 0420 06/18/12 0312  WBC 8.6 6.4  HGB 15.3 14.7  HCT 45.0 44.8  PLT 263 238   BMET  Basename 06/20/12 0420 06/18/12 0312  NA 141 139  K 3.2* 3.5  CL 103 103  CO2 28 31  GLUCOSE 125* 147*  BUN 13 14  CREATININE 0.74 0.84  CALCIUM 8.2* 7.7*   PT/INR No results found for this basename: LABPROT:2,INR:2 in the last 72 hours ABG No results found for this basename: PHART:2,PCO2:2,PO2:2,HCO3:2 in the last 72 hours  Studies/Results: No results found.  Anti-infectives: Anti-infectives     Start     Dose/Rate Route Frequency Ordered Stop   06/15/12 2200   cefOXitin (MEFOXIN) 2 g in dextrose 5 % 50 mL IVPB        2 g 100 mL/hr over 30 Minutes Intravenous 3 times per day 06/15/12 1851     06/15/12 1300   cefOXitin (MEFOXIN) 2 g in dextrose 5 % 50 mL IVPB        2 g 100 mL/hr over 30 Minutes Intravenous STAT 06/15/12 1252 06/15/12 1403          Assessment/Plan: s/p Procedure(s) (LRB) with comments: HERNIA REPAIR INGUINAL INCARCERATED (Right) SMALL BOWEL RESECTION (N/A) Remove NGT Ice chips OOB ambulate Cont wound care  LOS: 5 days    Shawn [name] A. 06/20/2012

## 2012-06-21 MED ORDER — OXYCODONE-ACETAMINOPHEN 5-325 MG PO TABS
1.0000 | ORAL_TABLET | ORAL | Status: DC | PRN
  Administered 2012-06-24: 1 via ORAL
  Filled 2012-06-21: qty 1

## 2012-06-21 MED ORDER — BUPROPION HCL ER (SR) 150 MG PO TB12
150.0000 mg | ORAL_TABLET | Freq: Two times a day (BID) | ORAL | Status: DC
  Administered 2012-06-21 – 2012-06-24 (×7): 150 mg via ORAL
  Filled 2012-06-21 (×8): qty 1

## 2012-06-21 NOTE — Plan of Care (Signed)
Problem: Phase II Progression Outcomes Goal: Vital signs stable Outcome: Progressing Low grade temp. Instructed pt to use use IS q74min this am with demonstrated understanding.

## 2012-06-21 NOTE — Progress Notes (Signed)
Patient ID: Shawn [name], male   DOB: 1950-11-30, 61 y.o.   MRN: [id number] 6 Days Post-Op  Subjective: Patient states he feels much better today. Has had a bowel movement. Denies any nausea but the NG out. Denies abdominal pain.  Objective: Vital signs in last 24 hours: Temp:  [99.6 F (37.6 C)-100.3 F (37.9 C)] 100.2 F (37.9 C) (10/20 0500) Pulse Rate:  [78-87] 78  (10/20 0500) Resp:  [18-20] 20  (10/20 0500) BP: (116-124)/(76-80) 120/78 mmHg (10/20 0500) SpO2:  [93 %-96 %] 96 % (10/20 0500) Last BM Date: 06/21/12  Intake/Output from previous day: 10/19 0701 - 10/20 0700 In: 3593.7 [P.O.:600; I.V.:2943.7; IV Piggyback:50] Out: 1850 [Urine:1850] Intake/Output this shift:    General appearance: alert, cooperative and no distress GI: normal findings: soft, non-tender Incision/Wound: both wounds packed open with serous drainage and clean  Lab Results:   Basename 06/20/12 0420  WBC 8.6  HGB 15.3  HCT 45.0  PLT 263   BMET  Basename 06/20/12 0420  NA 141  K 3.2*  CL 103  CO2 28  GLUCOSE 125*  BUN 13  CREATININE 0.74  CALCIUM 8.2*     Studies/Results: No results found.  Anti-infectives: Anti-infectives     Start     Dose/Rate Route Frequency Ordered Stop   06/15/12 2200   cefOXitin (MEFOXIN) 2 g in dextrose 5 % 50 mL IVPB        2 g 100 mL/hr over 30 Minutes Intravenous 3 times per day 06/15/12 1851     06/15/12 1300   cefOXitin (MEFOXIN) 2 g in dextrose 5 % 50 mL IVPB        2 g 100 mL/hr over 30 Minutes Intravenous STAT 06/15/12 1252 06/15/12 1403          Assessment/Plan: s/p Procedure(s): HERNIA REPAIR INGUINAL INCARCERATED SMALL BOWEL RESECTION Continued improvement.  Will start clear liquid diet. Continue antibiotics and wound care.   LOS: 6 days    Shawn [name] T 06/21/2012

## 2012-06-21 NOTE — Progress Notes (Signed)
Dr. Biagio Quint aware via phone pt having frequent, foul-smelling diarrhea stools. Currently on IV antbx. See new order for enteric precautions and C-DIFF PCR.

## 2012-06-22 LAB — BASIC METABOLIC PANEL
Calcium: 7.8 mg/dL — ABNORMAL LOW (ref 8.4–10.5)
Creatinine, Ser: 0.79 mg/dL (ref 0.50–1.35)
GFR calc Af Amer: >90 mL/min (ref >90)

## 2012-06-22 LAB — CBC
MCH: 31.8 pg (ref 26.0–34.0)
MCV: 92.3 fL (ref 78.0–100.0)
Platelets: 235 10*3/uL (ref 150–400)
RDW: 13.9 % (ref 11.5–15.5)
WBC: 8.5 10*3/uL (ref 4.0–10.5)

## 2012-06-22 MED ORDER — LIP MEDEX EX OINT
1.0000 "application " | TOPICAL_OINTMENT | Freq: Two times a day (BID) | CUTANEOUS | Status: DC
  Administered 2012-06-22 – 2012-06-24 (×4): 1 via TOPICAL
  Filled 2012-06-22: qty 7

## 2012-06-22 MED ORDER — WITCH HAZEL-GLYCERIN EX PADS
1.0000 "application " | MEDICATED_PAD | CUTANEOUS | Status: DC | PRN
  Filled 2012-06-22: qty 100

## 2012-06-22 MED ORDER — ZINC OXIDE 12.8 % EX OINT
TOPICAL_OINTMENT | CUTANEOUS | Status: DC | PRN
  Filled 2012-06-22: qty 56.7

## 2012-06-22 MED ORDER — ZINC OXIDE 20 % EX OINT
TOPICAL_OINTMENT | CUTANEOUS | Status: DC | PRN
  Filled 2012-06-22: qty 28.35

## 2012-06-22 MED ORDER — MAGIC MOUTHWASH
15.0000 mL | Freq: Four times a day (QID) | ORAL | Status: DC | PRN
  Filled 2012-06-22: qty 15

## 2012-06-22 MED ORDER — BISMUTH SUBSALICYLATE 262 MG/15ML PO SUSP
30.0000 mL | Freq: Two times a day (BID) | ORAL | Status: DC | PRN
  Filled 2012-06-22: qty 236

## 2012-06-22 MED ORDER — ALUM & MAG HYDROXIDE-SIMETH 200-200-20 MG/5ML PO SUSP: 30.0000 mL | Freq: Four times a day (QID) | ORAL | Status: DC | PRN

## 2012-06-22 MED ORDER — BISMUTH SUBSALICYLATE 262 MG/15ML PO SUSP
30.0000 mL | Freq: Two times a day (BID) | ORAL | Status: AC
  Administered 2012-06-22 – 2012-06-23 (×3): 30 mL via ORAL

## 2012-06-22 MED ORDER — DIPHENHYDRAMINE HCL 50 MG/ML IJ SOLN: 12.5000 mg | Freq: Four times a day (QID) | INTRAMUSCULAR | Status: DC | PRN

## 2012-06-22 NOTE — Progress Notes (Signed)
Patient ID: Shawn [name], male   DOB: 1950/09/24, 61 y.o.   MRN: [id number] 7 Days Post-Op  Subjective: Pt feels better today.  C/o multiple loose stools, c diff negative.  Tolerating clear liquids  Objective: Vital signs in last 24 hours: Temp:  [97.9 F (36.6 C)-98.2 F (36.8 C)] 97.9 F (36.6 C) (10/21 0640) Pulse Rate:  [67-80] 67  (10/21 0640) Resp:  [20-22] 20  (10/21 0640) BP: (107-119)/(75-77) 107/75 mmHg (10/21 0640) SpO2:  [94 %-95 %] 94 % (10/21 0640) Last BM Date: 06/22/12  Intake/Output from previous day: 10/20 0701 - 10/21 0700 In: 1665.4 [I.V.:1565.4; IV Piggyback:100] Out: 725 [Urine:725] Intake/Output this shift: Total I/O In: 434 [P.O.:360; Other:74] Out: 275 [Urine:200; Stool:75]  PE: Abd: soft, incisions are cleaning up.  Fascia intake.  +BS, ND  Lab Results:   Basename 06/22/12 0351 06/20/12 0420  WBC 8.5 8.6  HGB 14.1 15.3  HCT 40.9 45.0  PLT 235 263   BMET  Basename 06/22/12 0351 06/20/12 0420  NA 132* 141  K 3.5 3.2*  CL 100 103  CO2 25 28  GLUCOSE 103* 125*  BUN 12 13  CREATININE 0.79 0.74  CALCIUM 7.8* 8.2*   PT/INR No results found for this basename: LABPROT:2,INR:2 in the last 72 hours CMP     Component Value Date/Time   NA 132* 06/22/2012 0351   K 3.5 06/22/2012 0351   CL 100 06/22/2012 0351   CO2 25 06/22/2012 0351   GLUCOSE 103* 06/22/2012 0351   BUN 12 06/22/2012 0351   CREATININE 0.79 06/22/2012 0351   CREATININE 0.76 09/04/2011 1130   CALCIUM 7.8* 06/22/2012 0351   PROT 6.9 06/15/2012 1232   ALBUMIN 3.5 06/15/2012 1232   AST 11 06/15/2012 1232   ALT 7 06/15/2012 1232   ALKPHOS 72 06/15/2012 1232   BILITOT 0.5 06/15/2012 1232   GFRNONAA >90 06/22/2012 0351   GFRAA >90 06/22/2012 0351   Lipase     Component Value Date/Time   LIPASE 8* 06/15/2012 1232       Studies/Results: No results found.  Anti-infectives: Anti-infectives     Start     Dose/Rate Route Frequency Ordered Stop   06/15/12 2200    cefOXitin (MEFOXIN) 2 g in dextrose 5 % 50 mL IVPB        2 g 100 mL/hr over 30 Minutes Intravenous 3 times per day 06/15/12 1851     06/15/12 1300   cefOXitin (MEFOXIN) 2 g in dextrose 5 % 50 mL IVPB        2 g 100 mL/hr over 30 Minutes Intravenous STAT 06/15/12 1252 06/15/12 1403           Assessment/Plan  1. 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 Deconditioning  Plan: 1. PT rec HH with 24 assist, OT rec SNF.  I will d/w the patient his options and see if he has someone that can be available 24h if needed.  If not then I will discuss SNF with him.  I did go ahead and write for [facility] incase he can still go home. 2. Advance to full liquids today 3. Suspect diarrhea is normal as his bowels are waking up.  C. Diff is negative.   LOS: 7 days    Zaylei Mullane E 06/22/2012, 10:59 AM Pager: [phone]

## 2012-06-22 NOTE — Progress Notes (Signed)
Physical Therapy Treatment Patient Details Name: Shawn [name] MRN: [id number] DOB: [birth year] Today's Date: 06/22/2012 Time: 4098-1191 PT Time Calculation (min): 26 min  PT Assessment / Plan / Recommendation Comments on Treatment Session  Progressing well with activity tolerance. Continues to demonstrate general weakness and impaired dynamic balance. Recommend ST rehab at SNF to maximize independence and safety with functional mobility.     Follow Up Recommendations  Post acute inpatient     Does the patient have the potential to tolerate intense rehabilitation  No, Recommend SNF  Barriers to Discharge        Equipment Recommendations  Rolling walker with 5" wheels    Recommendations for Other Services OT consult  Frequency Min 3X/week   Plan Discharge plan remains appropriate    Precautions / Restrictions Precautions Precautions: Fall Restrictions Weight Bearing Restrictions: No   Pertinent Vitals/Pain 2/10 abdomen    Mobility  Bed Mobility Bed Mobility: Supine to Sit;Sit to Supine Supine to Sit: 4: Min assist Sit to Supine: 4: Min assist Details for Bed Mobility Assistance: Assist for trunk to upright/supine and bil LE off/onto bed. Increased time. VCs safety, technique.  Transfers Transfers: Sit to Stand;Stand to [employer] Transfers Sit to Stand: From chair/3-in-1;4: Min assist;With armrests;With upper extremity assist;From elevated surface Stand to Sit: To chair/3-in-1;4: Min assist;With upper extremity assist;To elevated surface Stand Pivot Transfers: 4: Min assist Details for Transfer Assistance: VCS safety, hand placement. Assist to rise, stabilize, control descent. Increased time to rise, descend.  Ambulation/Gait Ambulation/Gait Assistance: 4: Min assist Ambulation Distance (Feet): 275 Feet Assistive device: Rolling walker Ambulation/Gait Assistance Details: Assist to stabilize throughout ambulation and for maneuvering the RW at times. Intermittent  instability noted. VCs safety, distance from RW, pacing.  Gait Pattern: Trunk flexed;Decreased stride length;Step-through pattern    Exercises     PT Diagnosis:    PT Problem List:   PT Treatment Interventions:     PT Goals Acute Rehab PT Goals Pt will go Supine/Side to Sit: Independently PT Goal: Supine/Side to Sit - Progress: Progressing toward goal Pt will go Sit to Stand: with modified independence PT Goal: Sit to Stand - Progress: Progressing toward goal Pt will Ambulate: >150 feet;with modified independence;with least restrictive assistive device PT Goal: Ambulate - Progress: Progressing toward goal  Visit Information  Last PT Received On: 06/22/12 Assistance Needed: +1    Subjective Data  Subjective: "I think I'm gonna have to go this route (rehab)" Patient Stated Goal: To get stronger   Cognition  Overall Cognitive Status: Appears within functional limits for tasks assessed/performed Arousal/Alertness: Awake/alert Orientation Level: Appears intact for tasks assessed Behavior During Session: [facility] for tasks performed    Balance     End of Session PT - End of Session Activity Tolerance: Patient tolerated treatment well Patient left: in bed;with call bell/phone within reach   GP     [name] [facility] 06/22/2012, 3:53 PM [phone]

## 2012-06-22 NOTE — Progress Notes (Signed)
Clinical Social Work Department CLINICAL SOCIAL WORK PLACEMENT NOTE 06/22/2012  Patient:  Shawn [name], Shawn [name]  Account Number:  [id number] Admit date:  06/15/2012  Clinical Social Worker:  Cori Razor, LCSW  Date/time:  06/22/2012 04:29 PM  Clinical Social Work is seeking post-discharge placement for this patient at the following level of care:   SKILLED NURSING   (*CSW will update this form in Epic as items are completed)   06/22/2012  Patient/family provided with [facility] Department of Clinical Social Work's list of facilities offering this level of care within the geographic area requested by the patient (or if unable, by the patient's family).  06/22/2012  Patient/family informed of their freedom to choose among providers that offer the needed level of care, that participate in Medicare, Medicaid or managed care program needed by the patient, have an available bed and are willing to accept the patient.    Patient/family informed of [facility]' ownership interest in [facility], as well as of the fact that they are under no obligation to receive care at this facility.  PASARR submitted to EDS on 06/22/2012 PASARR number received from EDS on   FL2 transmitted to all facilities in geographic area requested by pt/family on  06/22/2012 FL2 transmitted to all facilities within larger geographic area on   Patient informed that his/her managed care company has contracts with or will negotiate with  certain facilities, including the following:     Patient/family informed of bed offers received:   Patient chooses bed at  Physician recommends and patient chooses bed at    Patient to be transferred to  on   Patient to be transferred to facility by   The following physician request were entered in Epic:   Additional Comments: Cori Razor LCSW [phone]

## 2012-06-22 NOTE — Progress Notes (Signed)
OT Cancellation Note  Patient Details Name: ARNETTE LANOUX MRN: [id number] DOB: [birth year]   Cancelled Treatment:    Reason Eval/Treat Not Completed: Other (comment) (pts refusal to participate)  Rayder Sullenger A OTR/L [phone] 06/22/2012, 3:49 PM

## 2012-06-22 NOTE — Care Management Note (Signed)
    Page 1 of 2   06/23/2012     12:27:02 PM   CARE MANAGEMENT NOTE 06/23/2012  Patient:  Shawn [name], Shawn [name]   Account Number:  [id number]  Date Initiated:  06/16/2012  Documentation initiated by:  DAVIS,RHONDA  Subjective/Objective Assessment:   ecg abnormal pre-op colon resection due to incarerated bowel, hypotensive post op. Lives alone.     Action/Plan:   Anticipated DC Date:  06/26/2012   Anticipated DC Plan:  SKILLED NURSING FACILITY  In-house referral  Clinical Social Worker      DC Planning Services  CM consult      [facility] Choice  NA   Choice offered to / List presented to:  NA   DME arranged  NA      DME agency  NA     HH arranged  NA      HH agency  NA   Status of service:  Completed, signed off Medicare Important Message given?  NA - LOS <3 / Initial given by admissions (If response is "NO", the following Medicare IM given date fields will be blank) Date Medicare IM given:   Date Additional Medicare IM given:    Discharge Disposition:  SKILLED NURSING FACILITY  Per UR Regulation:  Reviewed for med. necessity/level of care/duration of stay  If discussed at Long Length of Stay Meetings, dates discussed:    Comments:  06-22-12 Lorenda Ishihara RN CM 1330 Spoke with patient at bedside regarding d/c plans. States he lives alone and prior to admission was independent with ADL's, drove self. Had a knee replacement in March of 2013 and states still has mobility issues related to that. Does not use a cane or walker for mobility. Patient is very weak and deconditioned and recommendations are for SNF vs 24h care. Patient has no family or others to assist at home. Would like to explore SNF options. Contacted CSW to follow up with patient for possible SNF for rehab.  [id number] Earlene Plater, RN, BSN, CCM: CHART REVIEWED AND UPDATED. NO DISCHARGE NEEDS PRESENT AT THIS TIME. CASE MANAGEMENT [phone]

## 2012-06-22 NOTE — Progress Notes (Signed)
Clinical Social Work Department BRIEF PSYCHOSOCIAL ASSESSMENT 06/22/2012  Patient:  Shawn [name], Shawn [name]     Account Number:  [id number]     Admit date:  06/15/2012  Clinical Social Worker:  Candie Chroman  Date/Time:  06/22/2012 04:17 PM  Referred by:  Care Management  Date Referred:  06/22/2012 Referred for  SNF Placement   Other Referral:   Interview type:  Patient Other interview type:    PSYCHOSOCIAL DATA Living Status:  ALONE Admitted from facility:   Level of care:   Primary support name:  Mr. Fayrene Fearing Primary support relationship to patient:  FRIEND Degree of support available:   limited    CURRENT CONCERNS Current Concerns  Post-Acute Placement   Other Concerns:    SOCIAL WORK ASSESSMENT / PLAN Pt is a 61 yr old gentleman living at home prior to hospitalization. CSW met with pt to assist with d/c planning. PT has recommended ST SNF following hospital d/c. Pt is in agreement with this plan. SNF search has been initiated and bed offers will be provided as received. Pt has dx of Bipolar D/O and level 2 PASRR may be required. Awaiting response from PASRR. CSW will begin insurance authorization process for SNF placement.   Assessment/plan status:  Psychosocial Support/Ongoing Assessment of Needs Other assessment/ plan:   Information/referral to community resources:   SNF list provided.    PATIENT'S/FAMILY'S RESPONSE TO PLAN OF CARE: Pt would prefer to return home with [facility] but is willing to accept ST SNF placement.     Cori Razor LCSW [phone]

## 2012-06-23 MED ORDER — ENSURE COMPLETE PO LIQD
237.0000 mL | Freq: Two times a day (BID) | ORAL | Status: DC
  Administered 2012-06-23 – 2012-06-24 (×2): 237 mL via ORAL

## 2012-06-23 NOTE — Progress Notes (Signed)
Physical Therapy Treatment Patient Details Name: Shawn [name] MRN: [id number] DOB: [birth year] Today's Date: 06/23/2012 Time: 1050-1105 PT Time Calculation (min): 15 min  PT Assessment / Plan / Recommendation Comments on Treatment Session  continuing to progress well.    Follow Up Recommendations  Post acute inpatient     Does the patient have the potential to tolerate intense rehabilitation  No, Recommend SNF  Barriers to Discharge        Equipment Recommendations  Rolling walker with 5" wheels    Recommendations for Other Services OT consult  Frequency Min 3X/week   Plan Discharge plan remains appropriate    Precautions / Restrictions Precautions Precautions: Fall Restrictions Weight Bearing Restrictions: No   Pertinent Vitals/Pain No c/o    Mobility  Bed Mobility Bed Mobility: Supine to Sit Supine to Sit: 4: Min guard;HOB elevated;With rails Details for Bed Mobility Assistance: Increased time. VCs safety, technique.  Transfers Transfers: Sit to Stand;Stand to Sit Sit to Stand: 4: Min assist;From bed Stand to Sit: 4: Min guard;To chair/3-in-1 Details for Transfer Assistance: VCs safety, technique, hand placement. Assist to rise, stabilize. Increased time to rise from low surface.  Ambulation/Gait Ambulation/Gait Assistance: 4: Min guard;4: Min Environmental consultant (Feet): 280 Feet Assistive device: Rolling walker Ambulation/Gait Assistance Details: VCs safety, posture, distance from RW, pacing. Better job with pacing, control this session. 1 standing rest break. Assist to stabilize intermittently.  Gait Pattern: Trunk flexed;Decreased stride length;Step-through pattern    Exercises General Exercises - Lower Extremity Ankle Circles/Pumps: AROM;Both;15 reps;Seated Quad Sets: AROM;Both;10 reps;Seated Hip ABduction/ADduction: AROM;Both;10 reps;Seated   PT Diagnosis:    PT Problem List:   PT Treatment Interventions:     PT Goals Acute Rehab PT  Goals Pt will go Supine/Side to Sit: Independently PT Goal: Supine/Side to Sit - Progress: Progressing toward goal Pt will go Sit to Stand: with modified independence PT Goal: Sit to Stand - Progress: Progressing toward goal Pt will Ambulate: >150 feet;with modified independence;with least restrictive assistive device PT Goal: Ambulate - Progress: Progressing toward goal Pt will Perform Home Exercise Program: Independently PT Goal: Perform Home Exercise Program - Progress: Progressing toward goal  Visit Information  Last PT Received On: 06/23/12 Assistance Needed: +1    Subjective Data  Subjective: "You are the ninth person to come in here this morning." Patient Stated Goal: [place]. regain independence   Cognition  Overall Cognitive Status: Appears within functional limits for tasks assessed/performed Arousal/Alertness: Awake/alert Orientation Level: Appears intact for tasks assessed Behavior During Session: [facility] for tasks performed    Balance     End of Session PT - End of Session Equipment Utilized During Treatment: Gait belt Activity Tolerance: Patient tolerated treatment well Patient left: in chair;with call bell/phone within reach (OT following behind PT )   GP     [name] [facility] 06/23/2012, 11:32 AM [phone]

## 2012-06-23 NOTE — Progress Notes (Signed)
FL2 and 30 Day Pasrr note in chart for MD signature. Bed offers have been provided to pt. Clinicals have been sent to [employer]. Awaiting decision regarding approval for ST SNF placement. Will continue to follow to assist with d/c planning needs.  Cori Razor LCSW [phone]

## 2012-06-23 NOTE — Progress Notes (Signed)
Nutrition Brief Note  Patient identified on the Malnutrition Screening Tool (MST) Report  Body mass index is 21.75 kg/(m^2). Pt meets criteria for normal healthy weight based on current BMI.   Current diet order is bland, patient is consuming approximately 75% of meals at this time. Labs and medications reviewed. Pt admitted with 3 days of nausea/vomiting during which time pt reports he was not able to keep anything down. Pt states before then, pt was eating well, 2 meals/day. Pt reports stable weight between 175-180 pounds, however noted pt's weight down 10 pounds in the past 2 months. POD# 8 small bowel resection. Pt denies any nausea and reports eating well. Pt interested in getting Ensure for additional nutrition, will order. No nutrition interventions warranted at this time. If nutrition issues arise, please consult RD.   Levon Hedger MS, RD, LDN [phone] Pager [phone] After Hours Pager

## 2012-06-23 NOTE — Progress Notes (Signed)
CSW assisting with d/c planning. Pt has chosen [facility] for ST SNF placement. Updated PN ( PA / PT ) sent to [employer]. Thirty Day note sent to Pasrr. Awaiting Radio broadcast assistant #. Will continue to follow to assist with d/c planning to SNF.  Cori Razor LCSW [phone]

## 2012-06-23 NOTE — Progress Notes (Signed)
Patient ID: Shawn [name], male   DOB: [birth year], 61 y.o.   MRN: [id number] 8 Days Post-Op  Subjective: Pt feels well today.  Tolerating bland diet.  Feels like his stools are thickening up some.  Objective: Vital signs in last 24 hours: Temp:  [98.6 F (37 C)-99.1 F (37.3 C)] 98.6 F (37 C) (10/22 0610) Pulse Rate:  [67-79] 69  (10/22 0610) Resp:  [20] 20  (10/22 0610) BP: (100-104)/(62-69) 104/68 mmHg (10/22 0610) SpO2:  [95 %-97 %] 95 % (10/22 0610) Last BM Date: 06/23/12  Intake/Output from previous day: 10/21 0701 - 10/22 0700 In: 2873.2 [P.O.:2160; I.V.:639.2] Out: 2725 [Urine:2650; Stool:75] Intake/Output this shift:    PE: Abd: soft, wounds are stable and packed.  +BS, ND, appropriately tender  Lab Results:   Basename 06/22/12 0351  WBC 8.5  HGB 14.1  HCT 40.9  PLT 235   BMET  Basename 06/22/12 0351  NA 132*  K 3.5  CL 100  CO2 25  GLUCOSE 103*  BUN 12  CREATININE 0.79  CALCIUM 7.8*   PT/INR No results found for this basename: LABPROT:2,INR:2 in the last 72 hours CMP     Component Value Date/Time   NA 132* 06/22/2012 0351   K 3.5 06/22/2012 0351   CL 100 06/22/2012 0351   CO2 25 06/22/2012 0351   GLUCOSE 103* 06/22/2012 0351   BUN 12 06/22/2012 0351   CREATININE 0.79 06/22/2012 0351   CREATININE 0.76 09/04/2011 1130   CALCIUM 7.8* 06/22/2012 0351   PROT 6.9 06/15/2012 1232   ALBUMIN 3.5 06/15/2012 1232   AST 11 06/15/2012 1232   ALT 7 06/15/2012 1232   ALKPHOS 72 06/15/2012 1232   BILITOT 0.5 06/15/2012 1232   GFRNONAA >90 06/22/2012 0351   GFRAA >90 06/22/2012 0351   Lipase     Component Value Date/Time   LIPASE 8* 06/15/2012 1232       Studies/Results: No results found.  Anti-infectives: Anti-infectives     Start     Dose/Rate Route Frequency Ordered Stop   06/15/12 2200   cefOXitin (MEFOXIN) 2 g in dextrose 5 % 50 mL IVPB  Status:  Discontinued        2 g 100 mL/hr over 30 Minutes Intravenous 3 times per day 06/15/12  1851 06/23/12 1119   06/15/12 1300   cefOXitin (MEFOXIN) 2 g in dextrose 5 % 50 mL IVPB        2 g 100 mL/hr over 30 Minutes Intravenous STAT 06/15/12 1252 06/15/12 1403           Assessment/Plan  1. 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 Wound infections, clean now  Plan: 1. Cont dressing changes.  Patient is tolerating a regular diet 2. Will dc abx therapy.  He has had a full weeks course. 3. Awaiting SNF placement   LOS: 8 days    Shawn [name] E 06/23/2012, 11:31 AM Pager: [phone]

## 2012-06-23 NOTE — Progress Notes (Signed)
Occupational Therapy Treatment Patient Details Name: Shawn [name] MRN: [id number] DOB: 1950/12/30 Today's Date: 06/23/2012 Time: 4098-1191 OT Time Calculation (min): 15 min  OT Assessment / Plan / Recommendation Comments on Treatment Session Pt tolerated session well and shouldnt need AE for LB ADL. Pt doing better with functional reach to feet.    Follow Up Recommendations  Skilled nursing facility    Barriers to Discharge       Equipment Recommendations  Rolling walker with 5" wheels    Recommendations for Other Services    Frequency Min 2X/week   Plan Discharge plan remains appropriate    Precautions / Restrictions Precautions Precautions: Fall Restrictions Weight Bearing Restrictions: No        ADL  Lower Body Dressing: Simulated;Set up;Other (comment) (don/doff socks) Where Assessed - Lower Body Dressing: Unsupported sitting Toilet Transfer: Simulated;Min guard Toilet Transfer Method: Other (comment) (up to sink and back to chair) Equipment Used: Rolling walker ADL Comments: Had pt try to don/doff socks today and pt able to reach to bilateral foot to perform without difficulty. He should not need AE now. Practiced up from chair to sink and back to chair as simulated functional transfer and pt performed with min guard assist.     OT Diagnosis:    OT Problem List:   OT Treatment Interventions:     OT Goals ADL Goals ADL Goal: Lower Body Dressing - Progress: Progressing toward goals (only simulated for socks today. shouldnt need AE) ADL Goal: Toilet Transfer - Progress: Progressing toward goals  Visit Information  Last OT Received On: 06/23/12 Assistance Needed: +1    Subjective Data  Subjective: what would you like for me to do Patient Stated Goal: agreeable to get up with OT   Prior Functioning       Cognition  Overall Cognitive Status: Appears within functional limits for tasks assessed/performed Arousal/Alertness: Awake/alert Orientation Level:  Appears intact for tasks assessed Behavior During Session: [facility] for tasks performed    Mobility  Shoulder Instructions Bed Mobility Bed Mobility: Supine to Sit Supine to Sit: 4: Min guard;HOB elevated;With rails Details for Bed Mobility Assistance: Increased time. VCs safety, technique.  Transfers Transfers: Sit to Stand;Stand to Sit Sit to Stand: 4: Min guard;With upper extremity assist;From chair/3-in-1 Stand to Sit: 4: Min guard;With upper extremity assist;To chair/3-in-1 Details for Transfer Assistance: min verbal cues for safety       Exercises  General Exercises - Lower Extremity Ankle Circles/Pumps: AROM;Both;15 reps;Seated Quad Sets: AROM;Both;10 reps;Seated Hip ABduction/ADduction: AROM;Both;10 reps;Seated   Balance     End of Session OT - End of Session Activity Tolerance: Patient tolerated treatment well Patient left: in chair;with call bell/phone within reach  GO     Lennox Laity [phone] 06/23/2012, 11:36 AM

## 2012-06-24 MED ORDER — ALUM & MAG HYDROXIDE-SIMETH 200-200-20 MG/5ML PO SUSP: 30.0000 mL | Freq: Four times a day (QID) | ORAL | Status: DC | PRN

## 2012-06-24 MED ORDER — OXYCODONE-ACETAMINOPHEN 5-325 MG PO TABS: 1.0000 | ORAL_TABLET | ORAL | Status: DC | PRN

## 2012-06-24 MED ORDER — ZINC OXIDE 12.8 % EX OINT: TOPICAL_OINTMENT | CUTANEOUS | Status: DC | PRN

## 2012-06-24 MED ORDER — NICOTINE 21 MG/24HR TD PT24: 1.0000 | MEDICATED_PATCH | Freq: Every day | TRANSDERMAL | Status: DC

## 2012-06-24 MED ORDER — ENSURE COMPLETE PO LIQD: 237.0000 mL | Freq: Two times a day (BID) | ORAL | Status: DC

## 2012-06-24 NOTE — Progress Notes (Signed)
Physical Therapy Treatment Patient Details Name: Shawn [name] MRN: [id number] DOB: 11/27/1950 Today's Date: 06/24/2012 Time: 4098-1191 PT Time Calculation (min): 30 min  PT Assessment / Plan / Recommendation Comments on Treatment Session  Pt unable to tolerate ambulation this session due to R foot arch pain. Demonstrated some stretching of R arch with tennis ball. Also placed ice pack on arch of R foot. Instructed pt to wear for 20-30 minutes, as tolerated. continue to recommend SNF.     Follow Up Recommendations  Post acute inpatient     Does the patient have the potential to tolerate intense rehabilitation  No, Recommend SNF  Barriers to Discharge        Equipment Recommendations  Rolling walker with 5" wheels    Recommendations for Other Services OT consult  Frequency Min 3X/week   Plan Discharge plan remains appropriate    Precautions / Restrictions Precautions Precautions: Fall Restrictions Weight Bearing Restrictions: No   Pertinent Vitals/Pain Arch of R foot-unrated. Pt unable to tolerate ambulation in hallway this session.     Mobility  Bed Mobility Bed Mobility: Supine to Sit;Sit to Supine Supine to Sit: 4: Min guard Sit to Supine: 4: Min guard Details for Bed Mobility Assistance: Increased time.  Transfers Transfers: Sit to Stand;Stand to Sit Sit to Stand: 4: Min assist Stand to Sit: 4: Min guard Details for Transfer Assistance: Assit to rise, stabilize. VCs safety, hand placement.  Ambulation/Gait Ambulation/Gait Assistance: 4: Min guard Ambulation Distance (Feet): 15 Feet Assistive device: Rolling walker Ambulation/Gait Assistance Details: Pt unable to tolerate ambulation this session due to pain in R arch of foot. Pt feels its because he hasn't used his shoes to walk. Pain has worsened over last day or two.  Gait Pattern: Antalgic;Decreased stride length;Decreased step length - right;Decreased step length - left;Step-through pattern;Trunk flexed      Exercises Other Exercises Other Exercises: Discussed and demonstrated using tennis ball under R arch of foot for stretching, within tolerance. Practiced for a few cycles sitting EOB. Left tennis ball in room for pt to try later if he chooses.    PT Diagnosis:    PT Problem List:   PT Treatment Interventions:     PT Goals Acute Rehab PT Goals Pt will go Supine/Side to Sit: Independently PT Goal: Supine/Side to Sit - Progress: Progressing toward goal Pt will go Sit to Stand: with modified independence PT Goal: Sit to Stand - Progress: Progressing toward goal Pt will Ambulate: >150 feet;with modified independence;with least restrictive assistive device PT Goal: Ambulate - Progress: Not progressing (this session due to R foot arch pain)  Visit Information  Last PT Received On: 06/24/12 Assistance Needed: +1    Subjective Data  Subjective: "I can barely walk. This foot is killing me. I think it's because I haven't been wearing my shoes when I walk" Patient Stated Goal: Less pain. Get back to walking   Cognition  Overall Cognitive Status: Appears within functional limits for tasks assessed/performed Arousal/Alertness: Awake/alert Orientation Level: Appears intact for tasks assessed Behavior During Session: [facility] for tasks performed    Balance     End of Session     GP     [name] [facility] 06/24/2012, 9:36 AM [phone]

## 2012-06-24 NOTE — Discharge Summary (Signed)
Physician Discharge Summary  Patient ID: JOSUA FERREBEE MRN: [id number] DOB/AGE: 1951/03/24 61 y.o.  Admit date: 06/15/2012 Discharge date: 06/24/2012  Admitting Diagnosis: Nausea and vomiting and right groin pain  Discharge Diagnosis Patient Active Problem List   Diagnosis Date Noted  . Bipolar disorder, current episode mixed, mild 02/04/2012  . Health maintenance examination 10/24/2011  . Hypoxia 10/24/2011  . Prostate cancer screening 09/19/2011  . Tobacco dependence 09/08/2011  . Erectile dysfunction 08/06/2011  . Osteoarthritis of left knee 08/06/2011  . Elevated blood pressure reading without diagnosis of hypertension 08/06/2011  . 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  Evaluation showed a incarcerated femoral hernia with gangrene and small intestine perforation and SBO.  He was taken to the OR and underwent the procedure above.  His fascia was closed but his skin was left open and packed with wet to dry.  Over the next several days the patient underwent dressing changes with wet to dry dressing and his diet was advanced as his bowel function improved.  He became deconditioned due to his surgical emergency therefore PT was consulted and it was recommended that he go to short term rehab to improve his strength and mobilization.  On POD#9, he was tolerating a bland diet, pain well controlled, wounds were healthy, working with PT, and off of antibiotics.  He was felt stable to discharge to [facility].     Medication List     As of 06/24/2012 10:02 AM    TAKE these medications         alum & mag hydroxide-simeth 200-200-20 MG/5ML suspension   Commonly known as: MAALOX/MYLANTA   Take 30 mLs by mouth every 6 (six) hours as needed (or bloating).      buPROPion 150 MG 12 hr tablet   Commonly known as: WELLBUTRIN SR   Take 1 tablet (150 mg total) by mouth 2 (two) times daily.      feeding supplement Liqd   Take 237 mLs by mouth 2 (two) times daily between meals.      nicotine 21 mg/24hr patch   Commonly known as: NICODERM CQ - dosed in mg/24 hours   Place 1 patch onto the skin daily.      oxyCODONE-acetaminophen 5-325 MG per tablet   Commonly known as: PERCOCET/ROXICET   Take 1-2 tablets by mouth every 4 (four) hours as needed.      Zinc Oxide 12.8 % ointment   Commonly known as: TRIPLE PASTE   Apply topically as needed (incotience dermatitis).             Follow-up Information    Follow up with Mariella Saa, MD. On 07/10/2012. (1:30pm, arrive at 1:10pm)    Contact information:   [address] [place] [address] [phone]          Signed: Denny Levy [facility] [phone]  06/24/2012, 10:02 AM

## 2012-06-24 NOTE — Progress Notes (Signed)
Pt has approval for ST SNF placement from [employer] insurance. PASRR # has been received. SNF bed is available today at [facility]. CSW will assist with d/c planning to SNF.  Cori Razor LCSW [phone]

## 2012-06-24 NOTE — Discharge Instructions (Signed)
Dressing Change A dressing is a material placed over wounds. It keeps the wound clean, dry, and protected from further injury. This provides an environment that favors wound healing.  BEFORE YOU BEGIN  Get your supplies together. Things you may need include:  Saline solution.  Flexible gauze dressing.  Medicated cream.  Tape.  Gloves.  Abdominal dressing pads.  Gauze squares.  Plastic bags.  Take pain medicine 30 minutes before the dressing change if you need it.  Take a shower before you do the first dressing change of the day. Use plastic wrap or a plastic bag to prevent the dressing from getting wet. REMOVING YOUR OLD DRESSING   Wash your hands with soap and water. Dry your hands with a clean towel.  Put on your gloves.  Remove any tape.  Carefully remove the old dressing. If the dressing sticks, you may dampen it with warm water to loosen it, or follow your caregiver's specific directions.  Remove any gauze or packing tape that is in your wound.  Take off your gloves.  Put the gloves, tape, gauze, or any packing tape into a plastic bag. CHANGING YOUR DRESSING  Open the supplies.  Take the cap off the saline solution.  Open the gauze package so that the gauze remains on the inside of the package.  Put on your gloves.  Clean your wound as told by your caregiver.  If you have been told to keep your wound dry, follow those instructions.  Your caregiver may tell you to do one or more of the following:  Pick up the gauze. Pour the saline solution over the gauze. Squeeze out the extra saline solution.  Put medicated cream or other medicine on your wound if you have been told to do so.  Put the solution soaked gauze only in your wound, not on the skin around it.  Pack your wound loosely or as told by your caregiver.  Put dry gauze on your wound.  Put abdominal dressing pads over the dry gauze if your wet gauze soaks through.  Tape the abdominal dressing  pads in place so they will not fall off. Do not wrap the tape completely around the affected part (arm, leg, abdomen).  Wrap the dressing pads with a flexible gauze dressing to secure it in place.  Take off your gloves. Put them in the plastic bag with the old dressing. Tie the bag shut and throw it away.  Keep the dressing clean and dry until your next dressing change.  Wash your hands. SEEK MEDICAL CARE IF:  Your skin around the wound looks red.  Your wound feels more tender or sore.  You see pus in the wound.  Your wound smells bad.  You have a fever.  Your skin around the wound has a rash that itches and burns.  You see black or yellow skin in your wound that was not there before.  You feel nauseous, throw up, and feel very tired. Document Released: 09/26/2004 Document Revised: 11/11/2011 Document Reviewed: 07/01/2011 [facility] Patient Information 2013 [place], [place].  CC[facility]      [facility], [place] [phone]  OPEN ABDOMINAL SURGERY: POST OP INSTRUCTIONS  Always review your discharge instruction sheet given to you by the facility where your surgery was performed.  IF YOU HAVE DISABILITY OR FAMILY LEAVE FORMS, YOU MUST BRING THEM TO THE OFFICE FOR PROCESSING.  PLEASE DO NOT GIVE THEM TO YOUR DOCTOR.  1. A prescription for pain medication may be given to  you upon discharge.  Take your pain medication as prescribed, if needed.  If narcotic pain medicine is not needed, then you may take acetaminophen (Tylenol) or ibuprofen (Advil) as needed. 2. Take your usually prescribed medications unless otherwise directed. 3. If you need a refill on your pain medication, please contact your pharmacy. They will contact our office to request authorization.  Prescriptions will not be filled after 5pm or on week-ends. 4. You should follow a light diet the first few days after arrival home, such as soup and crackers, pudding, etc.unless your doctor has advised otherwise. A  high-fiber, low fat diet can be resumed as tolerated.   Be sure to include lots of fluids daily. Most patients will experience some swelling and bruising on the chest and neck area.  Ice packs will help.  Swelling and bruising can take several days to resolve 5. Most patients will experience some swelling and bruising in the area of the incision. Ice pack will help. Swelling and bruising can take several days to resolve..  6. It is common to experience some constipation if taking pain medication after surgery.  Increasing fluid intake and taking a stool softener will usually help or prevent this problem from occurring.  A mild laxative (Milk of Magnesia or Miralax) should be taken according to package directions if there are no bowel movements after 48 hours. 7.  You may have steri-strips (small skin tapes) in place directly over the incision.  These strips should be left on the skin for 7-10 days.  If your surgeon used skin glue on the incision, you may shower in 24 hours.  The glue will flake off over the next 2-3 weeks.  Any sutures or staples will be removed at the office during your follow-up visit. You may find that a light gauze bandage over your incision may keep your staples from being rubbed or pulled. You may shower and replace the bandage daily. 8. ACTIVITIES:  You may resume regular (light) daily activities beginning the next day--such as daily self-care, walking, climbing stairs--gradually increasing activities as tolerated.  You may have sexual intercourse when it is comfortable.  Refrain from any heavy lifting or straining until approved by your doctor. a. You may drive when you no longer are taking prescription pain medication, you can comfortably wear a seatbelt, and you can safely maneuver your car and apply brakes b. Return to Work: ___________________________________ 9. You should see your doctor in the office for a follow-up appointment approximately two weeks after your surgery.  Make  sure that you call for this appointment within a day or two after you arrive home to insure a convenient appointment time. OTHER INSTRUCTIONS:  _____________________________________________________________ _____________________________________________________________  WHEN TO CALL YOUR DOCTOR: 1. Fever over 101.0 2. Inability to urinate 3. Nausea and/or vomiting 4. Extreme swelling or bruising 5. Continued bleeding from incision. 6. Increased pain, redness, or drainage from the incision. 7. Difficulty swallowing or breathing 8. Muscle cramping or spasms. 9. Numbness or tingling in hands or feet or around lips.  The clinic staff is available to answer your questions during regular business hours.  Please don't hesitate to call and ask to speak to one of the nurses if you have concerns.  For further questions, please visit [email or web address]

## 2012-06-25 NOTE — Progress Notes (Signed)
Clinical Social Work Department CLINICAL SOCIAL WORK PLACEMENT NOTE 06/25/2012  Patient:  Shawn [name], Shawn [name]  Account Number:  [id number] Admit date:  06/15/2012  Clinical Social Worker:  Cori Razor, LCSW  Date/time:  06/22/2012 04:29 PM  Clinical Social Work is seeking post-discharge placement for this patient at the following level of care:   SKILLED NURSING   (*CSW will update this form in Epic as items are completed)   06/22/2012  Patient/family provided with [facility] Department of Clinical Social Work's list of facilities offering this level of care within the geographic area requested by the patient (or if unable, by the patient's family).  06/22/2012  Patient/family informed of their freedom to choose among providers that offer the needed level of care, that participate in Medicare, Medicaid or managed care program needed by the patient, have an available bed and are willing to accept the patient.    Patient/family informed of [facility]' ownership interest in [facility], as well as of the fact that they are under no obligation to receive care at this facility.  PASARR submitted to EDS on 06/22/2012 PASARR number received from EDS on   FL2 transmitted to all facilities in geographic area requested by pt/family on  06/22/2012 FL2 transmitted to all facilities within larger geographic area on   Patient informed that his/her managed care company has contracts with or will negotiate with  certain facilities, including the following:     Patient/family informed of bed offers received:  06/23/2012 Patient chooses bed at [facility] Physician recommends and patient chooses bed at    Patient to be transferred to [facility] on  06/24/2012 Patient to be transferred to facility by [facility]  The following physician request were entered in Epic:   Additional Comments:  Cori Razor LCSW [phone]

## 2012-06-26 ENCOUNTER — Telehealth (INDEPENDENT_AMBULATORY_CARE_PROVIDER_SITE_OTHER): Payer: Self-pay | Admitting: General Surgery

## 2012-06-26 NOTE — Telephone Encounter (Signed)
Steward Drone is wound care nurse and after changing dressing yesterday she asked if Dr. Johna Sheriff thought a wound vac would be appropriate in this instance? I reviewed this with Dr. Johna Sheriff and he wants to wait on a decision until he can evaluate wound at office visit which is on 07-10-12. I relayed this message to Steward Drone and she will continue wet-to-dry dressing changes and will update [place] with any changes/ [phone]

## 2012-07-10 ENCOUNTER — Ambulatory Visit (INDEPENDENT_AMBULATORY_CARE_PROVIDER_SITE_OTHER): Payer: PRIVATE HEALTH INSURANCE | Admitting: General Surgery

## 2012-07-10 ENCOUNTER — Encounter (INDEPENDENT_AMBULATORY_CARE_PROVIDER_SITE_OTHER): Payer: Self-pay | Admitting: General Surgery

## 2012-07-10 VITALS — BP 112/80 | HR 68 | Temp 97.3°F | Resp 18 | Ht 73.5 in | Wt 155.4 lb

## 2012-07-10 DIAGNOSIS — Z09 Encounter for follow-up examination after completed treatment for conditions other than malignant neoplasm: Secondary | ICD-10-CM

## 2012-07-10 NOTE — Progress Notes (Signed)
History: Patient returns for his first office visit approximately 3 weeks following emergency surgery for small bowel obstruction and an incarcerated femoral hernia with gangrene and perforation. He required laparotomy and small bowel resection as well as a McVay repair of his right femoral hernia. He developed an early postoperative wound infection and has been in a skilled nursing facility since discharge. He feels he is making good progress, gaining strength and denies abdominal or GI complaints. His wounds are being dressed with saline gauze daily.  Exam: Blood pressure 112/80, pulse 68, temperature 97.3 F (36.3 C), temperature source Temporal, resp. rate 18, height 6' 1.5" (1.867 m), weight 155 lb 6.4 oz (70.489 kg). General: Somewhat chronically ill but not acutely ill-appearing male Abdomen: Generally soft and nontender and nondistended. His midline wound is opened. There appears to be fascial separation and there is some undermining for several centimeters in all directions but this all appears to be healthy granulation tissue. The inguinal wound just has a thin strip of granulation tissue and is nearly healed.  Assessment and plan: Status post laparotomy and small bowel resection and repair of incarcerated hernia. He is making steady progress. Continue current wound care. I will see him back in 6 weeks

## 2012-07-10 NOTE — Patient Instructions (Signed)
Continue current wound care

## 2012-07-17 ENCOUNTER — Ambulatory Visit (INDEPENDENT_AMBULATORY_CARE_PROVIDER_SITE_OTHER): Payer: PRIVATE HEALTH INSURANCE

## 2012-07-17 VITALS — BP 132/84 | HR 70 | Temp 97.0°F | Resp 16 | Wt 157.8 lb

## 2012-07-17 DIAGNOSIS — Z4801 Encounter for change or removal of surgical wound dressing: Secondary | ICD-10-CM

## 2012-07-18 ENCOUNTER — Encounter (HOSPITAL_COMMUNITY): Payer: Self-pay | Admitting: Emergency Medicine

## 2012-07-18 ENCOUNTER — Emergency Department (HOSPITAL_COMMUNITY)
Admission: EM | Admit: 2012-07-18 | Discharge: 2012-07-18 | Disposition: A | Discharge status: Home / Self Care | Payer: PRIVATE HEALTH INSURANCE | Source: Home / Self Care

## 2012-07-18 DIAGNOSIS — IMO0001 Reserved for inherently not codable concepts without codable children: Secondary | ICD-10-CM

## 2012-07-18 DIAGNOSIS — Z4800 Encounter for change or removal of nonsurgical wound dressing: Secondary | ICD-10-CM

## 2012-07-18 NOTE — ED Notes (Addendum)
Pt here for abdomen dressing change. Having some pain but is relieved with pain meds.  Pt voices no concerns.   Surgery 10/14 for incarcerated inguinal hernia.

## 2012-07-18 NOTE — ED Provider Notes (Signed)
Medical screening examination/treatment/procedure(s) were performed by non-physician practitioner and as supervising physician I was immediately available for consultation/collaboration.  [name] Meche, M.D.   Praise Stennett C Safina Huard, MD 07/18/12 2202 

## 2012-07-18 NOTE — ED Notes (Signed)
Waiting dressing change
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
## 2012-07-20 ENCOUNTER — Ambulatory Visit (INDEPENDENT_AMBULATORY_CARE_PROVIDER_SITE_OTHER): Payer: PRIVATE HEALTH INSURANCE

## 2012-07-20 VITALS — BP 130/86 | HR 74 | Temp 97.9°F | Resp 18 | Wt 160.2 lb

## 2012-07-20 DIAGNOSIS — Z4801 Encounter for change or removal of surgical wound dressing: Secondary | ICD-10-CM

## 2012-07-20 NOTE — Progress Notes (Signed)
Patient comes in today for wound check and dressing change.  Patient reports he went to Urgent Care over the weekend for assistance on changing his dressing.  Patient states they gave him supplies and guided him on changing his gauze.  Patient demonstrated place wet to dry gauze, but was placing it over his lower right incision and not in the open abdominal wound.  I assisted patient and gave him instructions on how to place wet to dry gauze in his abdominal wound.  Patient did not feel comfortable doing his own dressing changes and would like to come back to the office.  Patient given an appointment for Wednesday July 22, 2012 @ 11:00 am with Neysa Bonito and we will schedule patient to see Dr. Johna Sheriff on Friday July 24, 2012.  Patient s/p sbo and incarcerated femoral hernia 06/15/12)

## 2012-07-20 NOTE — Progress Notes (Signed)
Late entry:  07/17/12 Patient comes in today for dressing change.  Remove dressing from abdominal wound and lower right incision (s/p sbo & incarcerated femoral hernia 06/15/12)  Wet to Dry gauze placed in abdominal wound and dry gauze placed over Lower right incision.  Patient tolerated well.  Patient does not feel he can change his dressing at home.  Patient scheduled for Nurse visit on 07/20/12.

## 2012-07-22 ENCOUNTER — Ambulatory Visit (INDEPENDENT_AMBULATORY_CARE_PROVIDER_SITE_OTHER): Payer: PRIVATE HEALTH INSURANCE | Admitting: General Surgery

## 2012-07-22 ENCOUNTER — Encounter (INDEPENDENT_AMBULATORY_CARE_PROVIDER_SITE_OTHER): Payer: Self-pay | Admitting: General Surgery

## 2012-07-22 VITALS — BP 120/70 | HR 90 | Temp 97.8°F | Ht 73.0 in | Wt 160.4 lb

## 2012-07-22 DIAGNOSIS — Z4800 Encounter for change or removal of nonsurgical wound dressing: Secondary | ICD-10-CM

## 2012-07-22 NOTE — Progress Notes (Signed)
Patient comes in today s/p SBO and inc femoral hernia repair by Dr.Hoxworth on 06/15/12 for wound check and dressing change.Marland Kitchenold wet to dry dressing was removed and clean wet to dry gauze was replaced...wound healing very nicely with zero drainage or sign of infection...patient handled very well and has another appt to see Dr.Hoxworth on 07/23/12 @ 9:30 per [place].Marland KitchenMarland Kitchen

## 2012-07-23 ENCOUNTER — Encounter (INDEPENDENT_AMBULATORY_CARE_PROVIDER_SITE_OTHER): Payer: PRIVATE HEALTH INSURANCE

## 2012-07-23 ENCOUNTER — Ambulatory Visit (INDEPENDENT_AMBULATORY_CARE_PROVIDER_SITE_OTHER): Payer: PRIVATE HEALTH INSURANCE | Admitting: General Surgery

## 2012-07-23 VITALS — BP 132/84 | HR 70 | Temp 98.2°F | Resp 16 | Wt 161.4 lb

## 2012-07-23 DIAGNOSIS — Z09 Encounter for follow-up examination after completed treatment for conditions other than malignant neoplasm: Secondary | ICD-10-CM

## 2012-07-23 NOTE — Progress Notes (Signed)
History: Patient returns for further followup post open small bowel resection repair of incarcerated right femoral hernia complicated by postop wound infection. He has been having his dressings changed every other day at the office. He reports a little constipation but otherwise no abdominal pain or vomiting and is strength is steadily increasing.  Exam: BP 132/84  Pulse 70  Temp 98.2 F (36.8 C) (Temporal)  Resp 16  Wt 161 lb 6.4 oz (73.211 kg) General: Thin but appears well Abdomen: His midline wound still has some undermining but is gradually closing it at all clean granulation. Groin wound is all but healed.  Assessment and plan: Stable and improving wound. Continue current wound care and return in 2 weeks.

## 2012-07-27 ENCOUNTER — Ambulatory Visit (INDEPENDENT_AMBULATORY_CARE_PROVIDER_SITE_OTHER): Payer: PRIVATE HEALTH INSURANCE

## 2012-07-27 ENCOUNTER — Encounter (INDEPENDENT_AMBULATORY_CARE_PROVIDER_SITE_OTHER): Payer: Self-pay

## 2012-07-27 VITALS — BP 138/84 | HR 71 | Temp 98.6°F | Resp 16 | Ht 73.5 in | Wt 159.4 lb

## 2012-07-27 DIAGNOSIS — Z4801 Encounter for change or removal of surgical wound dressing: Secondary | ICD-10-CM

## 2012-07-27 NOTE — Progress Notes (Signed)
Patient comes in today for dressing change.  Patient reports he's having a little more discomfort in his abdomen.  Patient reports his pain medication causes mild itching, patient advised he can try taking half a tablet and see if that helps relieve some of the effects of itching.  Patient wound repack with wet to dry gauze, wound appears to be healing well and is decreasing in size.  No redness or swelling seen at the time of his dressing change.  Dry gauze placed over lower right incision.  Patient tolerated well and will come back to the office on Wednesday 07/29/12 @ 11:00 am for dressing change and wound check.  (s/p SBO & Incarcerated Femoral Hernia)

## 2012-07-29 ENCOUNTER — Encounter (INDEPENDENT_AMBULATORY_CARE_PROVIDER_SITE_OTHER): Payer: Self-pay

## 2012-07-29 ENCOUNTER — Ambulatory Visit (INDEPENDENT_AMBULATORY_CARE_PROVIDER_SITE_OTHER): Payer: PRIVATE HEALTH INSURANCE

## 2012-07-29 VITALS — BP 120/78 | HR 70 | Temp 97.5°F | Resp 16 | Wt 161.2 lb

## 2012-07-29 DIAGNOSIS — Z4801 Encounter for change or removal of surgical wound dressing: Secondary | ICD-10-CM

## 2012-07-29 NOTE — Progress Notes (Signed)
Patient comes in today for wound check.  Patient wound appears to be healing well. Reviewed with Dr. Janee Morn and advised to place dry gauze in abd wound during the  Holiday and if patient feel's he can change and repack his wound to place dry gauze inside the wound vs wet to dry.  Patient instructed to place dry gauze in his open abd wound and to allow a 1/2 to 1 inch of gauze to remain outside of the abd wound. Patient will come in Monday for wound check and gauze change.  Patient advised that we will have a General Surgeon on call if he has any concerns or questions regarding his wound.  Dry gauze placed in abd wound, then abd pad placed over wound and RLQ incision.   Patient tolerated well.  Patient will follow up on Monday 08/03/12 @ 1:00 pm.

## 2012-08-03 ENCOUNTER — Encounter (INDEPENDENT_AMBULATORY_CARE_PROVIDER_SITE_OTHER): Payer: Self-pay

## 2012-08-03 ENCOUNTER — Ambulatory Visit (INDEPENDENT_AMBULATORY_CARE_PROVIDER_SITE_OTHER): Payer: PRIVATE HEALTH INSURANCE

## 2012-08-03 VITALS — BP 118/84 | HR 70 | Temp 98.4°F | Resp 16 | Wt 161.6 lb

## 2012-08-03 DIAGNOSIS — Z4801 Encounter for change or removal of surgical wound dressing: Secondary | ICD-10-CM

## 2012-08-03 NOTE — Progress Notes (Signed)
Patient comes in today for dressing change.  Patient abdominal wound was repacked with wet to dry gauze, wound appears to be healing well and is decreasing in size. No redness or swelling seen at the time of his dressing change. Dry gauze placed over lower right incision. Patient tolerated well and will come back to the office on Thursday 12/20513 @ 11:00 am for dressing change and wound check. (s/p SBO & Incarcerated Femoral Hernia)

## 2012-08-05 ENCOUNTER — Inpatient Hospital Stay (HOSPITAL_COMMUNITY)
Admission: EM | Admit: 2012-08-05 | Discharge: 2012-08-12 | DRG: 482 | Disposition: A | Discharge status: Skilled Nursing Facility | Payer: PRIVATE HEALTH INSURANCE | Attending: Family Medicine | Admitting: Family Medicine

## 2012-08-05 ENCOUNTER — Encounter (HOSPITAL_COMMUNITY): Payer: Self-pay | Admitting: *Deleted

## 2012-08-05 DIAGNOSIS — Z96659 Presence of unspecified artificial knee joint: Secondary | ICD-10-CM

## 2012-08-05 DIAGNOSIS — Z720 Tobacco use: Secondary | ICD-10-CM

## 2012-08-05 DIAGNOSIS — F1010 Alcohol abuse, uncomplicated: Secondary | ICD-10-CM | POA: Diagnosis present

## 2012-08-05 DIAGNOSIS — R030 Elevated blood-pressure reading, without diagnosis of hypertension: Secondary | ICD-10-CM

## 2012-08-05 DIAGNOSIS — S72142A Displaced intertrochanteric fracture of left femur, initial encounter for closed fracture: Secondary | ICD-10-CM

## 2012-08-05 DIAGNOSIS — S72143A Displaced intertrochanteric fracture of unspecified femur, initial encounter for closed fracture: Principal | ICD-10-CM | POA: Diagnosis present

## 2012-08-05 DIAGNOSIS — T50995A Adverse effect of other drugs, medicaments and biological substances, initial encounter: Secondary | ICD-10-CM | POA: Diagnosis not present

## 2012-08-05 DIAGNOSIS — Z0000 Encounter for general adult medical examination without abnormal findings: Secondary | ICD-10-CM

## 2012-08-05 DIAGNOSIS — F17200 Nicotine dependence, unspecified, uncomplicated: Secondary | ICD-10-CM | POA: Diagnosis present

## 2012-08-05 DIAGNOSIS — Z125 Encounter for screening for malignant neoplasm of prostate: Secondary | ICD-10-CM

## 2012-08-05 DIAGNOSIS — Z8659 Personal history of other mental and behavioral disorders: Secondary | ICD-10-CM

## 2012-08-05 DIAGNOSIS — W010XXA Fall on same level from slipping, tripping and stumbling without subsequent striking against object, initial encounter: Secondary | ICD-10-CM | POA: Diagnosis present

## 2012-08-05 DIAGNOSIS — S300XXA Contusion of lower back and pelvis, initial encounter: Secondary | ICD-10-CM | POA: Diagnosis present

## 2012-08-05 DIAGNOSIS — K5909 Other constipation: Secondary | ICD-10-CM | POA: Diagnosis not present

## 2012-08-05 DIAGNOSIS — S72002A Fracture of unspecified part of neck of left femur, initial encounter for closed fracture: Secondary | ICD-10-CM | POA: Diagnosis present

## 2012-08-05 DIAGNOSIS — R0902 Hypoxemia: Secondary | ICD-10-CM

## 2012-08-05 DIAGNOSIS — Y9289 Other specified places as the place of occurrence of the external cause: Secondary | ICD-10-CM

## 2012-08-05 DIAGNOSIS — K5900 Constipation, unspecified: Secondary | ICD-10-CM | POA: Diagnosis not present

## 2012-08-05 DIAGNOSIS — M1712 Unilateral primary osteoarthritis, left knee: Secondary | ICD-10-CM

## 2012-08-05 DIAGNOSIS — N529 Male erectile dysfunction, unspecified: Secondary | ICD-10-CM

## 2012-08-05 DIAGNOSIS — F1911 Other psychoactive substance abuse, in remission: Secondary | ICD-10-CM | POA: Diagnosis not present

## 2012-08-05 DIAGNOSIS — Z9049 Acquired absence of other specified parts of digestive tract: Secondary | ICD-10-CM

## 2012-08-05 DIAGNOSIS — W19XXXA Unspecified fall, initial encounter: Secondary | ICD-10-CM

## 2012-08-05 DIAGNOSIS — Z8719 Personal history of other diseases of the digestive system: Secondary | ICD-10-CM

## 2012-08-05 DIAGNOSIS — F3161 Bipolar disorder, current episode mixed, mild: Secondary | ICD-10-CM

## 2012-08-05 DIAGNOSIS — F10929 Alcohol use, unspecified with intoxication, unspecified: Secondary | ICD-10-CM

## 2012-08-05 NOTE — ED Notes (Signed)
Per ems report, the patient was at [employer] and was walking [place] when he fell. Unknown downtime, passerby saw patient. ?LOC. Pt c/o pain in left hip (? Rotation noted)  Pt says he went to [employer] around 9pm and thinks his knee gave out and fell. Noted several abrasions. ETOH on board. IV established prior to arrival

## 2012-08-05 NOTE — ED Notes (Signed)
JXB:[id number]<GN> Expected date:08/05/12<BR> Expected time:11:16 PM<BR> Means of arrival:Ambulance<BR> Comments:<BR> Fall/hip pain/ETOH/LSB

## 2012-08-06 ENCOUNTER — Inpatient Hospital Stay (HOSPITAL_COMMUNITY): Payer: PRIVATE HEALTH INSURANCE

## 2012-08-06 ENCOUNTER — Encounter (INDEPENDENT_AMBULATORY_CARE_PROVIDER_SITE_OTHER): Payer: PRIVATE HEALTH INSURANCE | Admitting: General Surgery

## 2012-08-06 ENCOUNTER — Encounter (HOSPITAL_COMMUNITY)
Admission: EM | Disposition: A | Discharge status: Skilled Nursing Facility | Payer: Self-pay | Source: Home / Self Care | Attending: Internal Medicine

## 2012-08-06 ENCOUNTER — Emergency Department (HOSPITAL_COMMUNITY): Payer: PRIVATE HEALTH INSURANCE

## 2012-08-06 ENCOUNTER — Inpatient Hospital Stay (HOSPITAL_COMMUNITY): Payer: PRIVATE HEALTH INSURANCE | Admitting: Anesthesiology

## 2012-08-06 ENCOUNTER — Encounter (HOSPITAL_COMMUNITY): Payer: Self-pay | Admitting: Anesthesiology

## 2012-08-06 ENCOUNTER — Encounter (HOSPITAL_COMMUNITY): Payer: Self-pay

## 2012-08-06 DIAGNOSIS — S72143A Displaced intertrochanteric fracture of unspecified femur, initial encounter for closed fracture: Secondary | ICD-10-CM | POA: Diagnosis present

## 2012-08-06 DIAGNOSIS — S300XXA Contusion of lower back and pelvis, initial encounter: Secondary | ICD-10-CM | POA: Diagnosis present

## 2012-08-06 DIAGNOSIS — S72002A Fracture of unspecified part of neck of left femur, initial encounter for closed fracture: Secondary | ICD-10-CM | POA: Diagnosis present

## 2012-08-06 DIAGNOSIS — Z8719 Personal history of other diseases of the digestive system: Secondary | ICD-10-CM

## 2012-08-06 DIAGNOSIS — F17200 Nicotine dependence, unspecified, uncomplicated: Secondary | ICD-10-CM

## 2012-08-06 DIAGNOSIS — F1010 Alcohol abuse, uncomplicated: Secondary | ICD-10-CM | POA: Diagnosis present

## 2012-08-06 DIAGNOSIS — Z720 Tobacco use: Secondary | ICD-10-CM | POA: Diagnosis present

## 2012-08-06 DIAGNOSIS — F1911 Other psychoactive substance abuse, in remission: Secondary | ICD-10-CM | POA: Diagnosis not present

## 2012-08-06 DIAGNOSIS — S72009A Fracture of unspecified part of neck of unspecified femur, initial encounter for closed fracture: Secondary | ICD-10-CM

## 2012-08-06 HISTORY — PX: FEMUR IM NAIL: SHX1597

## 2012-08-06 LAB — CBC
HCT: 38.2 % — ABNORMAL LOW (ref 39.0–52.0)
MCH: 31.2 pg (ref 26.0–34.0)
MCV: 92.5 fL (ref 78.0–100.0)
RBC: 4.13 MIL/uL — ABNORMAL LOW (ref 4.22–5.81)
RDW: 14 % (ref 11.5–15.5)
WBC: 7.2 10*3/uL (ref 4.0–10.5)

## 2012-08-06 LAB — URINALYSIS, ROUTINE W REFLEX MICROSCOPIC
Bilirubin Urine: NEGATIVE
Ketones, ur: NEGATIVE mg/dL
Nitrite: NEGATIVE
Protein, ur: NEGATIVE mg/dL
Urobilinogen, UA: 1 mg/dL (ref 0.0–1.0)

## 2012-08-06 LAB — RAPID URINE DRUG SCREEN, HOSP PERFORMED
Barbiturates: NOT DETECTED
Benzodiazepines: NOT DETECTED

## 2012-08-06 LAB — PROTIME-INR
INR: 0.92 (ref 0.00–1.49)
Prothrombin Time: 12.3 seconds (ref 11.6–15.2)

## 2012-08-06 LAB — CBC WITH DIFFERENTIAL/PLATELET
Basophils Absolute: 0 10*3/uL (ref 0.0–0.1)
HCT: 40.3 % (ref 39.0–52.0)
Lymphocytes Relative: 14 % (ref 12–46)
Monocytes Absolute: 0.2 10*3/uL (ref 0.1–1.0)
Neutro Abs: 4.3 10*3/uL (ref 1.7–7.7)
Neutrophils Relative %: 82 % — ABNORMAL HIGH (ref 43–77)
Platelets: 254 10*3/uL (ref 150–400)
RDW: 14.2 % (ref 11.5–15.5)
WBC: 5.3 10*3/uL (ref 4.0–10.5)

## 2012-08-06 LAB — BASIC METABOLIC PANEL
CO2: 24 mEq/L (ref 19–32)
Calcium: 8.8 mg/dL (ref 8.4–10.5)
GFR calc Af Amer: >90 mL/min (ref >90)
Sodium: 148 mEq/L — ABNORMAL HIGH (ref 135–145)

## 2012-08-06 LAB — ETHANOL: Alcohol, Ethyl (B): 259 mg/dL — ABNORMAL HIGH (ref 0–11)

## 2012-08-06 LAB — TYPE AND SCREEN
ABO/RH(D): A POS
Antibody Screen: NEGATIVE

## 2012-08-06 SURGERY — INSERTION, INTRAMEDULLARY ROD, FEMUR
Anesthesia: General | Site: Hip | Laterality: Left | Wound class: Clean

## 2012-08-06 MED ORDER — HYDROMORPHONE HCL PF 1 MG/ML IJ SOLN
INTRAMUSCULAR | Status: AC
  Administered 2012-08-06: 0.5 mg
  Filled 2012-08-06: qty 1

## 2012-08-06 MED ORDER — VITAMIN B-1 100 MG PO TABS
100.0000 mg | ORAL_TABLET | Freq: Every day | ORAL | Status: DC
  Administered 2012-08-07 – 2012-08-12 (×6): 100 mg via ORAL
  Filled 2012-08-06 (×6): qty 1

## 2012-08-06 MED ORDER — DOCUSATE SODIUM 100 MG PO CAPS: 100.0000 mg | ORAL_CAPSULE | Freq: Two times a day (BID) | ORAL | Status: DC

## 2012-08-06 MED ORDER — PHENOL 1.4 % MT LIQD
1.0000 | OROMUCOSAL | Status: DC | PRN
  Filled 2012-08-06: qty 177

## 2012-08-06 MED ORDER — SENNOSIDES-DOCUSATE SODIUM 8.6-50 MG PO TABS
1.0000 | ORAL_TABLET | Freq: Every evening | ORAL | Status: DC | PRN
  Filled 2012-08-06: qty 1

## 2012-08-06 MED ORDER — CLINDAMYCIN PHOSPHATE 900 MG/50ML IV SOLN
900.0000 mg | Freq: Three times a day (TID) | INTRAVENOUS | Status: DC
  Administered 2012-08-06: 900 mg via INTRAVENOUS
  Filled 2012-08-06: qty 50

## 2012-08-06 MED ORDER — CEFAZOLIN SODIUM-DEXTROSE 2-3 GM-% IV SOLR
2.0000 g | Freq: Four times a day (QID) | INTRAVENOUS | Status: AC
  Administered 2012-08-06 – 2012-08-07 (×2): 2 g via INTRAVENOUS
  Filled 2012-08-06 (×2): qty 50

## 2012-08-06 MED ORDER — ACETAMINOPHEN 650 MG RE SUPP: 650.0000 mg | Freq: Four times a day (QID) | RECTAL | Status: DC | PRN

## 2012-08-06 MED ORDER — LACTATED RINGERS IV SOLN
INTRAVENOUS | Status: DC
  Administered 2012-08-06: 1000 mL via INTRAVENOUS
  Administered 2012-08-06: 13:00:00 via INTRAVENOUS

## 2012-08-06 MED ORDER — METHOCARBAMOL 500 MG PO TABS
500.0000 mg | ORAL_TABLET | Freq: Four times a day (QID) | ORAL | Status: DC | PRN
  Administered 2012-08-06 – 2012-08-12 (×14): 500 mg via ORAL
  Filled 2012-08-06 (×15): qty 1

## 2012-08-06 MED ORDER — METHOCARBAMOL 100 MG/ML IJ SOLN
500.0000 mg | Freq: Four times a day (QID) | INTRAMUSCULAR | Status: DC | PRN
  Administered 2012-08-06: 500 mg via INTRAVENOUS
  Filled 2012-08-06: qty 5

## 2012-08-06 MED ORDER — EPHEDRINE SULFATE 50 MG/ML IJ SOLN
INTRAMUSCULAR | Status: DC | PRN
  Administered 2012-08-06: 10 mg via INTRAVENOUS

## 2012-08-06 MED ORDER — SODIUM CHLORIDE 0.9 % IV SOLN
1000.0000 mL | INTRAVENOUS | Status: DC
  Administered 2012-08-06: 1000 mL via INTRAVENOUS

## 2012-08-06 MED ORDER — ONDANSETRON HCL 4 MG/2ML IJ SOLN
4.0000 mg | Freq: Once | INTRAMUSCULAR | Status: AC
  Administered 2012-08-06: 4 mg via INTRAVENOUS
  Filled 2012-08-06: qty 2

## 2012-08-06 MED ORDER — FENTANYL CITRATE 0.05 MG/ML IJ SOLN
50.0000 ug | Freq: Once | INTRAMUSCULAR | Status: AC
  Administered 2012-08-06: 100 ug via INTRAVENOUS

## 2012-08-06 MED ORDER — ONDANSETRON HCL 4 MG PO TABS: 4.0000 mg | ORAL_TABLET | Freq: Four times a day (QID) | ORAL | Status: DC | PRN

## 2012-08-06 MED ORDER — LOVENOX 40 MG/0.4ML ~~LOC~~ SOLN: 40.0000 mg | SUBCUTANEOUS | Status: DC

## 2012-08-06 MED ORDER — MIDAZOLAM HCL 5 MG/5ML IJ SOLN
INTRAMUSCULAR | Status: DC | PRN
  Administered 2012-08-06: 2 mg via INTRAVENOUS

## 2012-08-06 MED ORDER — ACETAMINOPHEN 325 MG PO TABS
650.0000 mg | ORAL_TABLET | Freq: Four times a day (QID) | ORAL | Status: DC | PRN
  Administered 2012-08-07: 650 mg via ORAL
  Filled 2012-08-06: qty 2

## 2012-08-06 MED ORDER — FLEET ENEMA 7-19 GM/118ML RE ENEM: 1.0000 | ENEMA | Freq: Once | RECTAL | Status: AC | PRN

## 2012-08-06 MED ORDER — SODIUM CHLORIDE 0.9 % IR SOLN
Status: DC | PRN
  Administered 2012-08-06: 13:00:00

## 2012-08-06 MED ORDER — ENOXAPARIN SODIUM 40 MG/0.4ML ~~LOC~~ SOLN
40.0000 mg | SUBCUTANEOUS | Status: DC
  Administered 2012-08-07 – 2012-08-12 (×6): 40 mg via SUBCUTANEOUS
  Filled 2012-08-06 (×7): qty 0.4

## 2012-08-06 MED ORDER — HYDROMORPHONE HCL PF 1 MG/ML IJ SOLN
0.2500 mg | INTRAMUSCULAR | Status: DC | PRN
  Administered 2012-08-06 (×3): 0.5 mg via INTRAVENOUS

## 2012-08-06 MED ORDER — METOCLOPRAMIDE HCL 5 MG/ML IJ SOLN: 5.0000 mg | Freq: Three times a day (TID) | INTRAMUSCULAR | Status: DC | PRN

## 2012-08-06 MED ORDER — SUCCINYLCHOLINE CHLORIDE 20 MG/ML IJ SOLN
INTRAMUSCULAR | Status: DC | PRN
  Administered 2012-08-06: 100 mg via INTRAVENOUS

## 2012-08-06 MED ORDER — ONDANSETRON HCL 4 MG/2ML IJ SOLN: 4.0000 mg | Freq: Four times a day (QID) | INTRAMUSCULAR | Status: DC | PRN

## 2012-08-06 MED ORDER — KCL IN DEXTROSE-NACL 20-5-0.45 MEQ/L-%-% IV SOLN
INTRAVENOUS | Status: AC
  Administered 2012-08-06: 23:00:00 via INTRAVENOUS
  Filled 2012-08-06 (×2): qty 1000

## 2012-08-06 MED ORDER — NICOTINE 21 MG/24HR TD PT24
21.0000 mg | MEDICATED_PATCH | Freq: Every day | TRANSDERMAL | Status: DC
  Administered 2012-08-07 – 2012-08-11 (×5): 21 mg via TRANSDERMAL
  Filled 2012-08-06 (×6): qty 1

## 2012-08-06 MED ORDER — FOLIC ACID 1 MG PO TABS: 1.0000 mg | ORAL_TABLET | Freq: Every day | ORAL | Status: DC

## 2012-08-06 MED ORDER — HYDROMORPHONE HCL PF 1 MG/ML IJ SOLN: 0.1000 mg | INTRAMUSCULAR | Status: DC | PRN

## 2012-08-06 MED ORDER — HYDROCODONE-ACETAMINOPHEN 5-325 MG PO TABS
1.0000 | ORAL_TABLET | Freq: Four times a day (QID) | ORAL | Status: DC | PRN
  Administered 2012-08-06: 1 via ORAL
  Administered 2012-08-07 – 2012-08-10 (×11): 2 via ORAL
  Administered 2012-08-10: 1 via ORAL
  Administered 2012-08-11 (×3): 2 via ORAL
  Filled 2012-08-06 (×5): qty 2
  Filled 2012-08-06: qty 1
  Filled 2012-08-06 (×10): qty 2

## 2012-08-06 MED ORDER — FENTANYL CITRATE 0.05 MG/ML IJ SOLN
INTRAMUSCULAR | Status: AC
  Filled 2012-08-06: qty 2

## 2012-08-06 MED ORDER — HYDROMORPHONE HCL PF 1 MG/ML IJ SOLN
INTRAMUSCULAR | Status: AC
  Filled 2012-08-06: qty 1

## 2012-08-06 MED ORDER — LACTATED RINGERS IV SOLN: INTRAVENOUS | Status: DC

## 2012-08-06 MED ORDER — CLINDAMYCIN PHOSPHATE 900 MG/50ML IV SOLN
INTRAVENOUS | Status: AC
  Filled 2012-08-06: qty 50

## 2012-08-06 MED ORDER — MENTHOL 3 MG MT LOZG
1.0000 | LOZENGE | OROMUCOSAL | Status: DC | PRN
  Filled 2012-08-06: qty 9

## 2012-08-06 MED ORDER — MORPHINE SULFATE 4 MG/ML IJ SOLN
4.0000 mg | Freq: Once | INTRAMUSCULAR | Status: AC
  Administered 2012-08-06: 4 mg via INTRAVENOUS
  Filled 2012-08-06: qty 1

## 2012-08-06 MED ORDER — METOCLOPRAMIDE HCL 10 MG PO TABS: 5.0000 mg | ORAL_TABLET | Freq: Three times a day (TID) | ORAL | Status: DC | PRN

## 2012-08-06 MED ORDER — SODIUM CHLORIDE 0.9 % IV SOLN: 1000.0000 mL | INTRAVENOUS | Status: DC

## 2012-08-06 MED ORDER — ONDANSETRON HCL 4 MG/2ML IJ SOLN
INTRAMUSCULAR | Status: DC | PRN
  Administered 2012-08-06: 4 mg via INTRAVENOUS

## 2012-08-06 MED ORDER — HYDROCODONE-ACETAMINOPHEN 5-325 MG PO TABS: 1.0000 | ORAL_TABLET | Freq: Four times a day (QID) | ORAL | Status: DC | PRN

## 2012-08-06 MED ORDER — CEFAZOLIN SODIUM-DEXTROSE 2-3 GM-% IV SOLR
INTRAVENOUS | Status: AC
  Filled 2012-08-06: qty 50

## 2012-08-06 MED ORDER — DEXAMETHASONE SODIUM PHOSPHATE 10 MG/ML IJ SOLN
INTRAMUSCULAR | Status: DC | PRN
  Administered 2012-08-06: 10 mg via INTRAVENOUS

## 2012-08-06 MED ORDER — OXYCODONE-ACETAMINOPHEN 5-325 MG PO TABS: 1.0000 | ORAL_TABLET | ORAL | Status: DC | PRN

## 2012-08-06 MED ORDER — CEFAZOLIN SODIUM-DEXTROSE 2-3 GM-% IV SOLR
INTRAVENOUS | Status: DC | PRN
  Administered 2012-08-06: 2 g via INTRAVENOUS

## 2012-08-06 MED ORDER — HYDROMORPHONE HCL PF 1 MG/ML IJ SOLN
0.5000 mg | INTRAMUSCULAR | Status: DC | PRN
  Administered 2012-08-06: 0.5 mg via INTRAVENOUS
  Filled 2012-08-06 (×2): qty 1

## 2012-08-06 MED ORDER — BISACODYL 5 MG PO TBEC: 5.0000 mg | DELAYED_RELEASE_TABLET | Freq: Every day | ORAL | Status: DC | PRN

## 2012-08-06 MED ORDER — FENTANYL CITRATE 0.05 MG/ML IJ SOLN
50.0000 ug | INTRAMUSCULAR | Status: AC | PRN
  Administered 2012-08-06 (×2): 50 ug via INTRAVENOUS
  Filled 2012-08-06 (×2): qty 2

## 2012-08-06 MED ORDER — FENTANYL CITRATE 0.05 MG/ML IJ SOLN
INTRAMUSCULAR | Status: DC | PRN
  Administered 2012-08-06 (×2): 50 ug via INTRAVENOUS

## 2012-08-06 MED ORDER — PROPOFOL 10 MG/ML IV BOLUS
INTRAVENOUS | Status: DC | PRN
  Administered 2012-08-06: 150 mg via INTRAVENOUS

## 2012-08-06 MED ORDER — OXYCODONE HCL 5 MG PO TABS
5.0000 mg | ORAL_TABLET | ORAL | Status: DC | PRN
  Administered 2012-08-06 – 2012-08-08 (×8): 10 mg via ORAL
  Administered 2012-08-08: 5 mg via ORAL
  Administered 2012-08-08 – 2012-08-12 (×8): 10 mg via ORAL
  Administered 2012-08-12: 5 mg via ORAL
  Filled 2012-08-06 (×20): qty 2

## 2012-08-06 MED ORDER — LIDOCAINE HCL (CARDIAC) 20 MG/ML IV SOLN
INTRAVENOUS | Status: DC | PRN
  Administered 2012-08-06: 50 mg via INTRAVENOUS

## 2012-08-06 MED ORDER — CLINDAMYCIN PHOSPHATE 600 MG/50ML IV SOLN
600.0000 mg | Freq: Three times a day (TID) | INTRAVENOUS | Status: AC
  Administered 2012-08-06 – 2012-08-07 (×2): 600 mg via INTRAVENOUS
  Filled 2012-08-06 (×2): qty 50

## 2012-08-06 SURGICAL SUPPLY — 43 items
BAG SPEC THK2 15X12 ZIP CLS (MISCELLANEOUS) ×1
BAG ZIPLOCK 12X15 (MISCELLANEOUS) ×2 IMPLANT
BANDAGE GAUZE ELAST BULKY 4 IN (GAUZE/BANDAGES/DRESSINGS) ×2 IMPLANT
BIT DRILL CANN LG 4.3MM (BIT) ×1 IMPLANT
BNDG COHESIVE 6X5 TAN STRL LF (GAUZE/BANDAGES/DRESSINGS) ×2 IMPLANT
CLOTH BEACON ORANGE TIMEOUT ST (SAFETY) ×2 IMPLANT
DRAPE C-ARM 42X72 X-RAY (DRAPES) IMPLANT
DRAPE STERI IOBAN 125X83 (DRAPES) ×2 IMPLANT
DRILL BIT CANN LG 4.3MM (BIT) ×2
DRSG MEPILEX BORDER 4X4 (GAUZE/BANDAGES/DRESSINGS) ×2 IMPLANT
DRSG MEPILEX BORDER 4X8 (GAUZE/BANDAGES/DRESSINGS) ×2 IMPLANT
DRSG PAD ABDOMINAL 8X10 ST (GAUZE/BANDAGES/DRESSINGS) ×2 IMPLANT
DURAPREP 26ML APPLICATOR (WOUND CARE) ×3 IMPLANT
ELECT REM PT RETURN 9FT ADLT (ELECTROSURGICAL) ×2
ELECTRODE REM PT RTRN 9FT ADLT (ELECTROSURGICAL) ×1 IMPLANT
GLOVE BIOGEL PI IND STRL 7.5 (GLOVE) ×1 IMPLANT
GLOVE BIOGEL PI IND STRL 8 (GLOVE) ×1 IMPLANT
GLOVE BIOGEL PI INDICATOR 7.5 (GLOVE)
GLOVE BIOGEL PI INDICATOR 8 (GLOVE) ×1
GLOVE INDICATOR 6.5 STRL GRN (GLOVE) IMPLANT
GLOVE SURG SS PI 7.0 STRL IVOR (GLOVE) ×2 IMPLANT
GLOVE SURG SS PI 8.0 STRL IVOR (GLOVE) ×2 IMPLANT
GOWN PREVENTION PLUS LG XLONG (DISPOSABLE) ×2 IMPLANT
GOWN PREVENTION PLUS XLARGE (GOWN DISPOSABLE) ×2 IMPLANT
GOWN STRL REIN XL XLG (GOWN DISPOSABLE) ×2 IMPLANT
GUIDEPIN 3.2X17.5 THRD DISP (PIN) ×2 IMPLANT
HIP FRAC NAIL LAG SCR 10.5X100 (Orthopedic Implant) ×1 IMPLANT
KIT BASIN OR (CUSTOM PROCEDURE TRAY) ×2 IMPLANT
MANIFOLD NEPTUNE II (INSTRUMENTS) ×2 IMPLANT
NAIL HIP FRACT 130D 11X180 (Screw) ×2 IMPLANT
PACK GENERAL/GYN (CUSTOM PROCEDURE TRAY) ×2 IMPLANT
PAD CAST 4YDX4 CTTN HI CHSV (CAST SUPPLIES) ×1 IMPLANT
PADDING CAST COTTON 4X4 STRL (CAST SUPPLIES) ×2
SCREW BONE CORTICAL 5.0X38 (Screw) ×1 IMPLANT
SCREW CANN THRD AFF 10.5X100 (Orthopedic Implant) ×1 IMPLANT
SPONGE GAUZE 4X4 12PLY (GAUZE/BANDAGES/DRESSINGS) ×2 IMPLANT
STAPLER VISISTAT (STAPLE) ×2 IMPLANT
SUT VIC AB 0 CT1 27 (SUTURE)
SUT VIC AB 0 CT1 27XBRD ANTBC (SUTURE) IMPLANT
SUT VIC AB 1 CT1 36 (SUTURE) ×2 IMPLANT
SUT VIC AB 2-0 CT1 27 (SUTURE) ×2
SUT VIC AB 2-0 CT1 TAPERPNT 27 (SUTURE) ×1 IMPLANT
TRAY FOLEY CATH 14FRSI W/METER (CATHETERS) ×2 IMPLANT

## 2012-08-06 NOTE — ED Notes (Signed)
MD at bedside to reevaluate pt pain, new orders obtained.

## 2012-08-06 NOTE — ED Notes (Signed)
Report to [facility] pt awaiting transport to OR

## 2012-08-06 NOTE — ED Notes (Signed)
Pt's pastor Shawn [name] [phone] [facility] Phone#[phone]

## 2012-08-06 NOTE — Anesthesia Postprocedure Evaluation (Signed)
  Anesthesia Post-op Note  Patient: Shawn [name]  Procedure(s) Performed: Procedure(s) (LRB): INTRAMEDULLARY (IM) NAIL FEMORAL (Left)  Patient Location: PACU  Anesthesia Type: General  Level of Consciousness: awake and alert   Airway and Oxygen Therapy: Patient Spontanous Breathing  Post-op Pain: mild  Post-op Assessment: Post-op Vital signs reviewed, Patient's Cardiovascular Status Stable, Respiratory Function Stable, Patent Airway and No signs of Nausea or vomiting  Last Vitals:  Filed Vitals:   08/06/12 1345  BP: 142/78  Pulse: 92  Temp:   Resp: 13    Post-op Vital Signs: stable   Complications: No apparent anesthesia complications

## 2012-08-06 NOTE — ED Notes (Signed)
Patient transported to CT 

## 2012-08-06 NOTE — ED Notes (Signed)
Pt to Or. Pt belongings with security

## 2012-08-06 NOTE — ED Notes (Signed)
OR at bedside to see pt.

## 2012-08-06 NOTE — Anesthesia Preprocedure Evaluation (Addendum)
Anesthesia Evaluation  Patient identified by MRN, date of birth, ID band Patient awake    Reviewed: Allergy & Precautions, H&P , NPO status , Patient's Chart, lab work & pertinent test results  Airway Mallampati: II TM Distance: >3 FB Neck ROM: full    Dental  (+) Edentulous Upper and Edentulous Lower   Pulmonary Current Smoker,  breath sounds clear to auscultation  Pulmonary exam normal       Cardiovascular Exercise Tolerance: Good negative cardio ROS  Rhythm:regular Rate:Normal     Neuro/Psych negative neurological ROS  negative psych ROS   GI/Hepatic negative GI ROS, Neg liver ROS, (+)     substance abuse  alcohol use,   Endo/Other  negative endocrine ROS  Renal/GU negative Renal ROS  negative genitourinary   Musculoskeletal   Abdominal   Peds  Hematology negative hematology ROS (+)   Anesthesia Other Findings   Reproductive/Obstetrics negative OB ROS                          Anesthesia Physical Anesthesia Plan  ASA: II  Anesthesia Plan: General   Post-op Pain Management:    Induction: Intravenous  Airway Management Planned: Oral ETT  Additional Equipment:   Intra-op Plan:   Post-operative Plan: Extubation in OR  Informed Consent: I have reviewed the patients History and Physical, chart, labs and discussed the procedure including the risks, benefits and alternatives for the proposed anesthesia with the patient or authorized representative who has indicated his/her understanding and acceptance.   Dental Advisory Given  Plan Discussed with: CRNA and Surgeon  Anesthesia Plan Comments:         Anesthesia Quick Evaluation

## 2012-08-06 NOTE — ED Provider Notes (Addendum)
History     CSN: [id number] Arrival date & time 08/05/12  2325 First MD Initiated Contact with Patient 08/06/12 0040      Chief Complaint  Patient presents with  . Fall    HPI Pt had been out to dinner and had some alcohol.  He was walking on a path when his leg gave out on him and he fell injuring his hip.  Pt was not able to get up.  He was found by bystanders and EMS was called.  Pt is having pain in his left hip.  Unable to stand.  No loc.  No chest pain or abd pain. Past Medical History  Diagnosis Date  . Osteoarthritis X years    left knee.  Distant hx (age 28) "dislocated" knee and required surgery, then crush injury to patella required knee cap removal.  . Diverticular disease     "itis" x 2 episodes (last was about 2007)  . Tobacco dependence   . Multiple rib fractures 10/09/10    s/p fall down a ravine--9th and 10th on right, contusion of left 7th and 8th ribs  . Fracture of metatarsal bone(s), closed     3rd, 4th, 5th mid shaft on left foot  . History of substance abuse     cocaine, marijuana, acid, alcohol (in remission since 2010)  . Solar dermatitis     face and ears  . Bipolar disorder     Has had multiple psych hospitalization in the past, most recently at [facility] 01/31/11-02/04/11.  Says meds made him emotionally blunted. (lamictal and wellbutrin).  . Alcoholism     "Dry" since 2002  . Depression     Past Surgical History  Procedure Date  . Knee dislocation surgery 1966  . Patellectomy 1968    s/p crush injury  . Inguinal hernia repair     left  . Hernia repair     at 61 years of age  87  . Total knee arthroplasty 11/20/2011    Procedure: TOTAL KNEE ARTHROPLASTY;  Surgeon: Loreta Ave, MD;  Location: [facility];  Service: Orthopedics;  Laterality: Left;  DR Eulah Pont WANTS FOR THIS CASE  . Inguinal hernia repair 06/15/2012    Procedure: HERNIA REPAIR INGUINAL INCARCERATED;  Surgeon: Mariella Saa, MD;  Location: [facility] ORS;  Service:  General;  Laterality: Right;  . Bowel resection 06/15/2012    Procedure: SMALL BOWEL RESECTION;  Surgeon: Mariella Saa, MD;  Location: [facility] ORS;  Service: General;  Laterality: N/A;    Family History  Problem Relation Age of Onset  . Arthritis Mother   . Arthritis Father   . Cancer Brother     lung cancer.  Died in early 18s.    History  Substance Use Topics  . Smoking status: Current Every Day Smoker -- 1.0 packs/day for 30 years    Types: Cigarettes  . Smokeless tobacco: Never Used  . Alcohol Use: Yes     Comment: daily      Review of Systems  All other systems reviewed and are negative.    Allergies  Codeine  Home Medications   Current Outpatient Rx  Name  Route  Sig  Dispense  Refill  . ALUM & MAG HYDROXIDE-SIMETH 200-200-20 MG/5ML PO SUSP   Oral   Take 30 mLs by mouth every 6 (six) hours as needed (or bloating).   355 mL      . BUPROPION HCL ER (SR) 150 MG PO TB12  Oral   Take 1 tablet (150 mg total) by mouth 2 (two) times daily.   60 tablet   3   . ENSURE COMPLETE PO LIQD   Oral   Take 237 mLs by mouth 2 (two) times daily between meals.           Dispense as written.   Marland Kitchen NICOTINE 21 MG/24HR TD PT24   Transdermal   Place 1 patch onto the skin daily.   28 patch   3   . OXYCODONE-ACETAMINOPHEN 5-325 MG PO TABS   Oral   Take 1-2 tablets by mouth every 4 (four) hours as needed.   60 tablet   0   . ZINC OXIDE 12.8 % EX OINT   Topical   Apply topically as needed (incotience dermatitis).   56.7 g   3     BP 107/68  Pulse 65  Temp 97.6 F (36.4 C)  Resp 11  SpO2 97%  Physical Exam  Nursing note and vitals reviewed. Constitutional: No distress.  HENT:  Head: Normocephalic and atraumatic.  Right Ear: External ear normal.  Left Ear: External ear normal.       Slurred speech   Eyes: Conjunctivae normal are normal. Right eye exhibits no discharge. Left eye exhibits no discharge. No scleral icterus.  Neck: Neck supple. No  tracheal deviation present.  Cardiovascular: Normal rate, regular rhythm and intact distal pulses.   Pulmonary/Chest: Effort normal and breath sounds normal. No stridor. No respiratory distress. He has no wheezes. He has no rales.  Abdominal: Soft. Bowel sounds are normal. He exhibits no distension. There is no tenderness. There is no rebound and no guarding.  Musculoskeletal: He exhibits tenderness. He exhibits no edema.       Right shoulder: Normal.       Left shoulder: Normal.       Right hip: Normal.       Left hip: He exhibits tenderness (abrasion left hip). He exhibits no swelling and no deformity.       Left knee: He exhibits no swelling, no effusion and no deformity.       Left ankle: Normal.       Cervical back: Normal.       Thoracic back: Normal.       Lumbar back: Normal.       Abrasion left knee  Neurological: He is alert. He has normal strength. No sensory deficit. Cranial nerve deficit:  no gross defecits noted. He exhibits normal muscle tone. He displays no seizure activity. Coordination normal.  Skin: Skin is warm and dry. No rash noted. He is not diaphoretic.  Psychiatric: He has a normal mood and affect.    ED Course  Procedures (including critical care time)  Labs Reviewed  BASIC METABOLIC PANEL - Abnormal; Notable for the following:    Sodium 148 (*)     Glucose, Bld 111 (*)     All other components within normal limits  ETHANOL - Abnormal; Notable for the following:    Alcohol, Ethyl (B) 259 (*)     All other components within normal limits  CBC WITH DIFFERENTIAL - Abnormal; Notable for the following:    Neutrophils Relative 82 (*)     All other components within normal limits  PROTIME-INR  TYPE AND SCREEN   Dg Hip Complete Left  08/06/2012  *RADIOLOGY REPORT*  Clinical Data: Fall.  Severe left hip pain.  LEFT HIP - COMPLETE 2+ VIEW  Comparison: None.  Findings: No  evidence of left hip fracture or dislocation.  Mild to moderate left hip osteoarthritis is  seen.  No acetabular or pelvic fracture identified.  IMPRESSION:  1.  No acute findings. 2.  Mild to moderate hip osteoarthritis.   Original Report Authenticated By: Myles Rosenthal, M.D.    Ct Head Wo Contrast  08/06/2012  *RADIOLOGY REPORT*  Clinical Data:  Status post fall; unknown whether the patient lost consciousness.  Concern for head or cervical spine injury. Abrasion to the lateral right side of the head.  CT HEAD WITHOUT CONTRAST AND CT CERVICAL SPINE WITHOUT CONTRAST  Technique:  Multidetector CT imaging of the head and cervical spine was performed following the standard protocol without intravenous contrast.  Multiplanar CT image reconstructions of the cervical spine were also generated.  Comparison: None  CT HEAD  Findings: There is no evidence of acute infarction, mass lesion, or intra- or extra-axial hemorrhage on CT.  Prominence of the ventricles and sulci reflects mild cortical volume loss.  Cerebellar atrophy is noted.  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  Scattered periventricular and subcortical white matter change likely reflects small vessel ischemic microangiopathy.  The brainstem and fourth ventricle are within normal limits.  The basal ganglia are unremarkable in appearance.  The cerebral hemispheres demonstrate grossly normal gray-white differentiation. No mass effect or midline shift is seen.  There is no evidence of fracture; visualized osseous structures are unremarkable in appearance.  The orbits are within normal limits. Minimal mucosal thickening is noted within the left maxillary sinus; the remaining paranasal sinuses and mastoid air cells are well-aerated.  No significant soft tissue abnormalities are seen.  IMPRESSION:  1.  No evidence of traumatic intracranial injury or fracture. 2.  Mild cortical volume loss and scattered small vessel ischemic microangiopathy. 3.  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 4.   Minimal mucosal thickening within the left maxillary sinus.  CT CERVICAL SPINE  Findings: There is no evidence of fracture or subluxation. Vertebral bodies demonstrate normal height and alignment.  There is slight narrowing of the intervertebral disc space at C2-C3, with mild associated calcification.  Prevertebral soft tissues are within normal limits.  The visualized neural foramina are grossly unremarkable.  There is incomplete fusion of the posterior elements of C4.  The thyroid gland is unremarkable in appearance.  The minimally visualized lung apices are clear.  No significant soft tissue abnormalities are seen.  IMPRESSION:  1.  No evidence of fracture or subluxation along the cervical spine. 2.  Incidental note of incomplete fusion of the posterior elements of C4.   Original Report Authenticated By: Tonia Ghent, M.D.    Ct Cervical Spine Wo Contrast  08/06/2012  *RADIOLOGY REPORT*  Clinical Data:  Status post fall; unknown whether the patient lost consciousness.  Concern for head or cervical spine injury. Abrasion to the lateral right side of the head.  CT HEAD WITHOUT CONTRAST AND CT CERVICAL SPINE WITHOUT CONTRAST  Technique:  Multidetector CT imaging of the head and cervical spine was performed following the standard protocol without intravenous contrast.  Multiplanar CT image reconstructions of the cervical spine were also generated.  Comparison: None  CT HEAD  Findings: There is no evidence of acute infarction, mass lesion, or intra- or extra-axial hemorrhage on CT.  Prominence of the ventricles and sulci reflects mild cortical volume loss.  Cerebellar atrophy is noted.  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  Scattered periventricular and subcortical  white matter change likely reflects small vessel ischemic microangiopathy.  The brainstem and fourth ventricle are within normal limits.  The basal ganglia are unremarkable in appearance.  The cerebral hemispheres  demonstrate grossly normal gray-white differentiation. No mass effect or midline shift is seen.  There is no evidence of fracture; visualized osseous structures are unremarkable in appearance.  The orbits are within normal limits. Minimal mucosal thickening is noted within the left maxillary sinus; the remaining paranasal sinuses and mastoid air cells are well-aerated.  No significant soft tissue abnormalities are seen.  IMPRESSION:  1.  No evidence of traumatic intracranial injury or fracture. 2.  Mild cortical volume loss and scattered small vessel ischemic microangiopathy. 3.  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 4.  Minimal mucosal thickening within the left maxillary sinus.  CT CERVICAL SPINE  Findings: There is no evidence of fracture or subluxation. Vertebral bodies demonstrate normal height and alignment.  There is slight narrowing of the intervertebral disc space at C2-C3, with mild associated calcification.  Prevertebral soft tissues are within normal limits.  The visualized neural foramina are grossly unremarkable.  There is incomplete fusion of the posterior elements of C4.  The thyroid gland is unremarkable in appearance.  The minimally visualized lung apices are clear.  No significant soft tissue abnormalities are seen.  IMPRESSION:  1.  No evidence of fracture or subluxation along the cervical spine. 2.  Incidental note of incomplete fusion of the posterior elements of C4.   Original Report Authenticated By: Tonia Ghent, M.D.    Mr Hip Left Wo Contrast  08/06/2012  *RADIOLOGY REPORT*  Clinical Data: Status post fall with severe left hip pain. Negative plain films.  MRI OF THE LEFT HIP WITHOUT CONTRAST  Technique:  Multiplanar, multisequence MR imaging was performed. No intravenous contrast was administered.  Comparison: Plain films 08/06/2012 at 1:17 a.m.  Findings: The patient has a nondisplaced acute left intertrochanteric fracture with associated marrow  edema.  There is extensive hematoma formation about the patient's fracture, particularly in the gluteus medius and vastus lateralis.  No other fracture is identified.  Both hips are located.  A 1.1 cm in diameter mixed signal intensity lesion in the proximal femur is well circumscribed and likely represents a benign entity such as an enchondroma.  The patient has mild to moderate bilateral hip degenerative change.  There is edema in the right gluteus medius and subcutaneous fat over the greater trochanter likely due to contusion.  Imaged intrapelvic contents demonstrate extensive sigmoid diverticular disease.  IMPRESSION:  1.  Acute left intertrochanteric fracture with extensive hematoma formation present about the fracture. 2.  Findings most consistent with gluteal hematoma on the right. 3.  Mild to moderate bilateral hip degenerative change. 4.  Sigmoid diverticulosis.   Original Report Authenticated By: Holley Dexter, M.D.      1. Alcohol intoxication   2. Fall       MDM  6:33 AM Pt is unable to bear weight.  Still having significant pain in the left hip.  Will give additional pain medications.  Plan on MRI of hip to assess for occult hip fracture.   8:13 AM MRI shows left intertrochanteric hip fracture.  Plan on admission for further treatment.  Celene Kras, MD 08/06/12 [phone]

## 2012-08-06 NOTE — Transfer of Care (Signed)
Immediate Anesthesia Transfer of Care Note  Patient: Shawn [name]  Procedure(s) Performed: Procedure(s) (LRB) with comments: INTRAMEDULLARY (IM) NAIL FEMORAL (Left)  Patient Location: PACU  Anesthesia Type:General  Level of Consciousness: sedated  Airway & Oxygen Therapy: Patient Spontanous Breathing and Patient connected to face mask oxygen  Post-op Assessment: Report given to PACU RN and Post -op Vital signs reviewed and stable  Post vital signs: Reviewed and stable  Complications: No apparent anesthesia complications

## 2012-08-06 NOTE — H&P (Signed)
PCP:   Jeoffrey Massed, MD   Chief Complaint:  Fall/Hip pain.   HPI: This is a 61 year old male, with known history of OA, s/p left TKA 11/20/11, alcohol abuse, tobacco abuse, remote substance abuse, including cocaine, marijuana , actinic dermatitis, bipolar disorder, multiple rib and metatarsal fractures follow ing a fall down a ravine, 20 years ago, s/p surgery for dislocated knee, s/p patellectomy for crush injury 2years ago, s/p incarcerated right femoral hernia repair/small bowel resection 06/15/12, diverticulosis, presenting following a fall. According to patient, he had been out to dinner on 08/05/12, had some alcohol and was walking home on a path, when he stepped on a rock, tripped and he fell injuring his left hip. He was not able to get up, was found by bystanders and EMS was called. Imaging studies confirmed acute left intertrochanteric fracture with extensive hematoma formation present about the fracture.    Allergies:   Allergies  Allergen Reactions  . Codeine Nausea Only      Past Medical History  Diagnosis Date  . Osteoarthritis X years    left knee.  Distant hx (age 64) "dislocated" knee and required surgery, then crush injury to patella required knee cap removal.  . Diverticular disease     "itis" x 2 episodes (last was about 2007)  . Tobacco dependence   . Multiple rib fractures 10/09/10    s/p fall down a ravine--9th and 10th on right, contusion of left 7th and 8th ribs  . Fracture of metatarsal bone(s), closed     3rd, 4th, 5th mid shaft on left foot  . History of substance abuse     cocaine, marijuana, acid, alcohol (in remission since 2010)  . Solar dermatitis     face and ears  . Bipolar disorder     Has had multiple psych hospitalization in the past, most recently at [facility] 01/31/11-02/04/11.  Says meds made him emotionally blunted. (lamictal and wellbutrin).  . Alcoholism     "Dry" since 2002  . Depression     Past Surgical History  Procedure  Date  . Knee dislocation surgery 1966  . Patellectomy 1968    s/p crush injury  . Inguinal hernia repair     left  . Hernia repair     at 61 years of age  58  . Total knee arthroplasty 11/20/2011    Procedure: TOTAL KNEE ARTHROPLASTY;  Surgeon: Loreta Ave, MD;  Location: [facility] OR;  Service: Orthopedics;  Laterality: Left;  DR Eulah Pont WANTS FOR THIS CASE  . Inguinal hernia repair 06/15/2012    Procedure: HERNIA REPAIR INGUINAL INCARCERATED;  Surgeon: Mariella Saa, MD;  Location: [facility] ORS;  Service: General;  Laterality: Right;  . Bowel resection 06/15/2012    Procedure: SMALL BOWEL RESECTION;  Surgeon: Mariella Saa, MD;  Location: [facility] ORS;  Service: General;  Laterality: N/A;    Prior to Admission medications   Medication Sig Start Date End Date Taking? Authorizing Provider  buPROPion (WELLBUTRIN SR) 150 MG 12 hr tablet Take 1 tablet (150 mg total) by mouth 2 (two) times daily. 02/04/12 02/03/13 Yes Jeoffrey Massed, MD  feeding supplement (ENSURE COMPLETE) LIQD Take 237 mLs by mouth 2 (two) times daily between meals. 06/24/12  Yes Elizabeth White, PA-C  nicotine (NICODERM CQ - DOSED IN MG/24 HOURS) 21 mg/24hr patch Place 1 patch onto the skin daily. 06/24/12  Yes Doristine Mango, PA-C  oxyCODONE-acetaminophen (PERCOCET/ROXICET) 5-325 MG per tablet Take 1-2 tablets by  mouth every 4 (four) hours as needed. For pain 06/24/12  Yes Doristine Mango, PA-C    Social History: Patient reports that he has been smoking Cigarettes.  He has a 30 pack-year smoking history. He has never used smokeless tobacco. He reports that he drinks alcohol. He reports that he does not use illicit drugs.  Family History  Problem Relation Age of Onset  . Arthritis Mother   . Arthritis Father   . Cancer Brother     lung cancer.  Died in early 67s.    Review of Systems:  As per HPI and chief complaint. Patent denies fatigue, diminished appetite, weight loss, fever, chills, headache, blurred  vision, difficulty in speaking, dysphagia, chest pain, cough, shortness of breath, orthopnea, paroxysmal nocturnal dyspnea, nausea, diaphoresis, abdominal pain, vomiting, diarrhea, belching, heartburn. He has some constipation, but denies hematemesis, melena, dysuria, nocturia, urinary frequency, hematochezia, lower extremity swelling, pain, or redness. The rest of the systems review is negative.  Physical Exam:  General:  Patient does not appear to be in obvious acute distress, although he does experience some pain, on attempts to change position in bed. Alert, communicative, fully oriented, talking in complete sentences, not short of breath at rest.  HEENT:  No clinical pallor, no jaundice, no conjunctival injection or discharge. Hydration status is fair.  NECK:  Supple, JVP not seen, no carotid bruits, no palpable lymphadenopathy, no palpable goiter. CHEST:  Clinically clear to auscultation, no wheezes, no crackles. HEART:  Sounds 1 and 2 heard, normal, regular, no murmurs. ABDOMEN:  Flat, soft, has healing surgical scars below the umbilicus, under dressings, including a gaping midline wound approximately 4 cm in length, without evidence of infection.  Abdomen is non-tender, no palpable organomegaly, no palpable masses, normal bowel sounds. GENITALIA:  Not examined. LOWER EXTREMITIES:  No pitting edema, palpable peripheral pulses. MUSCULOSKELETAL SYSTEM:  Generalized osteoarthritic changes, Left leg is externally rotated. No obvious bruising is visible. Unable to turn patient to look at gluteal region, secondary to pain on movement.  CENTRAL NERVOUS SYSTEM:  No focal neurologic deficit on gross examination.  Labs on Admission:  Results for orders placed during the hospital encounter of 08/05/12 (from the past 48 hour(s))  BASIC METABOLIC PANEL     Status: Abnormal   Collection Time   08/06/12  1:03 AM      Component Value Range Comment   Sodium 148 (*) 135 - 145 mEq/L    Potassium 3.7  3.5 -  5.1 mEq/L    Chloride 110  96 - 112 mEq/L    CO2 24  19 - 32 mEq/L    Glucose, Bld 111 (*) 70 - 99 mg/dL    BUN 9  6 - 23 mg/dL    Creatinine, Ser 5.78  0.50 - 1.35 mg/dL    Calcium 8.8  8.4 - 46.9 mg/dL    GFR calc non Af Amer >90  >90 mL/min    GFR calc Af Amer >90  >90 mL/min   ETHANOL     Status: Abnormal   Collection Time   08/06/12  1:03 AM      Component Value Range Comment   Alcohol, Ethyl (B) 259 (*) 0 - 11 mg/dL   CBC WITH DIFFERENTIAL     Status: Abnormal   Collection Time   08/06/12  1:03 AM      Component Value Range Comment   WBC 5.3  4.0 - 10.5 K/uL    RBC 4.38  4.22 - 5.81 MIL/uL  Hemoglobin 13.5  13.0 - 17.0 g/dL    HCT 40.9  81.1 - 91.4 %    MCV 92.0  78.0 - 100.0 fL    MCH 30.8  26.0 - 34.0 pg    MCHC 33.5  30.0 - 36.0 g/dL    RDW 78.2  95.6 - 21.3 %    Platelets 254  150 - 400 K/uL    Neutrophils Relative 82 (*) 43 - 77 %    Neutro Abs 4.3  1.7 - 7.7 K/uL    Lymphocytes Relative 14  12 - 46 %    Lymphs Abs 0.7  0.7 - 4.0 K/uL    Monocytes Relative 4  3 - 12 %    Monocytes Absolute 0.2  0.1 - 1.0 K/uL    Eosinophils Relative 1  0 - 5 %    Eosinophils Absolute 0.1  0.0 - 0.7 K/uL    Basophils Relative 0  0 - 1 %    Basophils Absolute 0.0  0.0 - 0.1 K/uL   PROTIME-INR     Status: Normal   Collection Time   08/06/12  1:03 AM      Component Value Range Comment   Prothrombin Time 12.3  11.6 - 15.2 seconds    INR 0.92  0.00 - 1.49   TYPE AND SCREEN     Status: Normal   Collection Time   08/06/12  1:03 AM      Component Value Range Comment   ABO/RH(D) A POS      Antibody Screen NEG      Sample Expiration 08/09/2012       Radiological Exams on Admission: *RADIOLOGY REPORT*  Clinical Data: Fall. Severe left hip pain.  LEFT HIP - COMPLETE 2+ VIEW  Comparison: None.  Findings: No evidence of left hip fracture or dislocation. Mild to  moderate left hip osteoarthritis is seen. No acetabular or pelvic  fracture identified.  IMPRESSION:  1. No acute  findings.  2. Mild to moderate hip osteoarthritis.  Original Report Authenticated By: Myles Rosenthal, M.D.   *RADIOLOGY REPORT*  Clinical Data: Status post fall; unknown whether the patient lost  consciousness. Concern for head or cervical spine injury.  Abrasion to the lateral right side of the head.  CT HEAD WITHOUT CONTRAST AND CT CERVICAL SPINE WITHOUT CONTRAST  Technique: Multidetector CT imaging of the head and cervical spine  was performed following the standard protocol without intravenous  contrast. Multiplanar CT image reconstructions of the cervical  spine were also generated.  Comparison: None  CT HEAD  Findings: There is no evidence of acute infarction, mass lesion, or  intra- or extra-axial hemorrhage on CT.  Prominence of the ventricles and sulci reflects mild cortical  volume loss. Cerebellar atrophy is noted. Mild chronic infarct is  suggested at the cerebellar hemispheres bilaterally. Scattered  periventricular and subcortical white matter change likely reflects  small vessel ischemic microangiopathy.  The brainstem and fourth ventricle are within normal limits. The  basal ganglia are unremarkable in appearance. The cerebral  hemispheres demonstrate grossly normal gray-white differentiation.  No mass effect or midline shift is seen.  There is no evidence of fracture; visualized osseous structures are  unremarkable in appearance. The orbits are within normal limits.  Minimal mucosal thickening is noted within the left maxillary  sinus; the remaining paranasal sinuses and mastoid air cells are  well-aerated. No significant soft tissue abnormalities are seen.  IMPRESSION:  1. No evidence of traumatic intracranial injury or fracture.  2. Mild cortical volume loss and scattered small vessel ischemic  microangiopathy.  3. 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  4. Minimal mucosal thickening within the left maxillary sinus.  CT  CERVICAL SPINE  Findings: There is no evidence of fracture or subluxation.  Vertebral bodies demonstrate normal height and alignment. There is  slight narrowing of the intervertebral disc space at C2-C3, with  mild associated calcification. Prevertebral soft tissues are  within normal limits. The visualized neural foramina are grossly  unremarkable. There is incomplete fusion of the posterior elements  of C4.  The thyroid gland is unremarkable in appearance. The minimally  visualized lung apices are clear. No significant soft tissue  abnormalities are seen.  IMPRESSION:  1. No evidence of fracture or subluxation along the cervical  spine.  2. Incidental note of incomplete fusion of the posterior elements  of C4.  Original Report Authenticated By: Tonia Ghent, M.D.     *RADIOLOGY REPORT*  Clinical Data: Status post fall; unknown whether the patient lost  consciousness. Concern for head or cervical spine injury.  Abrasion to the lateral right side of the head.  CT HEAD WITHOUT CONTRAST AND CT CERVICAL SPINE WITHOUT CONTRAST  Technique: Multidetector CT imaging of the head and cervical spine  was performed following the standard protocol without intravenous  contrast. Multiplanar CT image reconstructions of the cervical  spine were also generated.  Comparison: None  CT HEAD  Findings: There is no evidence of acute infarction, mass lesion, or  intra- or extra-axial hemorrhage on CT.  Prominence of the ventricles and sulci reflects mild cortical  volume loss. Cerebellar atrophy is noted. Mild chronic infarct is  suggested at the cerebellar hemispheres bilaterally. Scattered  periventricular and subcortical white matter change likely reflects  small vessel ischemic microangiopathy.  The brainstem and fourth ventricle are within normal limits. The  basal ganglia are unremarkable in appearance. The cerebral  hemispheres demonstrate grossly normal gray-white differentiation.  No  mass effect or midline shift is seen.  There is no evidence of fracture; visualized osseous structures are  unremarkable in appearance. The orbits are within normal limits.  Minimal mucosal thickening is noted within the left maxillary  sinus; the remaining paranasal sinuses and mastoid air cells are  well-aerated. No significant soft tissue abnormalities are seen.  IMPRESSION:  1. No evidence of traumatic intracranial injury or fracture.  2. Mild cortical volume loss and scattered small vessel ischemic  microangiopathy.  3. 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  4. Minimal mucosal thickening within the left maxillary sinus.     *RADIOLOGY REPORT*  Clinical Data: Status post fall with severe left hip pain.  Negative plain films.  MRI OF THE LEFT HIP WITHOUT CONTRAST  Technique: Multiplanar, multisequence MR imaging was performed. No  intravenous contrast was administered.  Comparison: Plain films 08/06/2012 at 1:17 a.m.  Findings: The patient has a nondisplaced acute left  intertrochanteric fracture with associated marrow edema. There is  extensive hematoma formation about the patient's fracture,  particularly in the gluteus medius and vastus lateralis. No other  fracture is identified. Both hips are located. A 1.1 cm in  diameter mixed signal intensity lesion in the proximal femur is  well circumscribed and likely represents a benign entity such as an  enchondroma. The patient has mild to moderate bilateral hip  degenerative change. There is edema in the right gluteus medius  and subcutaneous fat over the greater trochanter likely due  to  contusion. Imaged intrapelvic contents demonstrate extensive  sigmoid diverticular disease.  IMPRESSION:  1. Acute left intertrochanteric fracture with extensive hematoma  formation present about the fracture.  2. Findings most consistent with gluteal hematoma on the right.  3. Mild to moderate bilateral  hip degenerative change.  4. Sigmoid diverticulosis.  Original Report Authenticated By: Holley Dexter, M.D.      Assessment/Plan Active Problems:   1. Hip fracture, left: Patient presented following a mechanical fall, and was found on imaging studies, to have an acute left intertrochanteric fracture, with extensive hematoma formation. He appears hemodynamically stable at this time. Will follow CBC, manage with analgesics, and await orthopedic input, with regards to definitive management. Dr Jene Every has been consulted by ED MD. Patient was very functional pre-admission, he has had no recent symptoms of chest pain or SOB, is in SR on telemetric monitoring and 12-lead EKG of 06/15/12, shows RBBB. He has no coronary artery disease history. Patient has there fore been assessed as moderate risk for surgery, and may proceed, provided repeat EKG shows no interval worrisome change.  2. Traumatic hematoma of buttock: Patient has a right gluteal hematoma, caused by his fall. As described above, he is hemodynamically stable, and HB is normal. Will follow CBC. PRBC has been typed and screened.  3. ETOH abuse: Patient has a long-standing history of ETOH abuse. According to him, he usually drinks 4-5 beers a day, but imbibed about 6 beers in evening of 08/05/12. He has no features of ETOH withdrawal at this time, but we have counseled him, and placed him on CIWA protocol and vitamin supplements.  4. H/O: substance abuse: Patient utilized cocaine and marijuana in the past, but claims to have quit long a ago. We shall check UDS.  5. Tobacco abuse: Patient used to smoke about 1 pack of cigarettes per day, for over 20 years. He states that he "quit" about 32 days ago, because of his recent hernia surgery, and is now down to about 3 cigarettes per day. He has been counseled. Placed on Nicoderm CQ.  6. History of hernia repair: Patient is s/p incarcerated right femoral hernia repair/small bowel resection  06/15/12, and has an open wound without evidence of infection, infra-umbilically, as well as other healing surgical scars. Will consult [facility], for local care.   Further management will depend on clinical course.   Comment: Patient is FULL CODE.    Time Spent on Admission: 1 hour.  Eldo Umanzor,CHRISTOPHER 08/06/2012, 9:47 AM

## 2012-08-06 NOTE — Consult Note (Signed)
Reason for Consult: Left hip fracture Referring Physician: EDP  Shawn [name] is an 61 y.o. male.  HPI: Larey Seat on hip while intoxicated  Past Medical History  Diagnosis Date  . Osteoarthritis X years    left knee.  Distant hx (age 7) "dislocated" knee and required surgery, then crush injury to patella required knee cap removal.  . Diverticular disease     "itis" x 2 episodes (last was about 2007)  . Tobacco dependence   . Multiple rib fractures 10/09/10    s/p fall down a ravine--9th and 10th on right, contusion of left 7th and 8th ribs  . Fracture of metatarsal bone(s), closed     3rd, 4th, 5th mid shaft on left foot  . History of substance abuse     cocaine, marijuana, acid, alcohol (in remission since 2010)  . Solar dermatitis     face and ears  . Bipolar disorder     Has had multiple psych hospitalization in the past, most recently at [facility] 01/31/11-02/04/11.  Says meds made him emotionally blunted. (lamictal and wellbutrin).  . Alcoholism     "Dry" since 2002  . Depression     Past Surgical History  Procedure Date  . Knee dislocation surgery 1966  . Patellectomy 1968    s/p crush injury  . Inguinal hernia repair     left  . Hernia repair     at 61 years of age  3  . Total knee arthroplasty 11/20/2011    Procedure: TOTAL KNEE ARTHROPLASTY;  Surgeon: Loreta Ave, MD;  Location: [facility];  Service: Orthopedics;  Laterality: Left;  DR Eulah Pont WANTS FOR THIS CASE  . Inguinal hernia repair 06/15/2012    Procedure: HERNIA REPAIR INGUINAL INCARCERATED;  Surgeon: Mariella Saa, MD;  Location: [facility];  Service: General;  Laterality: Right;  . Bowel resection 06/15/2012    Procedure: SMALL BOWEL RESECTION;  Surgeon: Mariella Saa, MD;  Location: [facility];  Service: General;  Laterality: N/A;    Family History  Problem Relation Age of Onset  . Arthritis Mother   . Arthritis Father   . Cancer Brother     lung cancer.  Died in early 27s.     Social History:  reports that he has been smoking Cigarettes.  He has a 30 pack-year smoking history. He has never used smokeless tobacco. He reports that he drinks alcohol. He reports that he does not use illicit drugs.  Allergies:  Allergies  Allergen Reactions  . Codeine Nausea Only    Medications: I have reviewed the patient's current medications.  Results for orders placed during the hospital encounter of 08/05/12 (from the past 48 hour(s))  BASIC METABOLIC PANEL     Status: Abnormal   Collection Time   08/06/12  1:03 AM      Component Value Range Comment   Sodium 148 (*) 135 - 145 mEq/L    Potassium 3.7  3.5 - 5.1 mEq/L    Chloride 110  96 - 112 mEq/L    CO2 24  19 - 32 mEq/L    Glucose, Bld 111 (*) 70 - 99 mg/dL    BUN 9  6 - 23 mg/dL    Creatinine, Ser 7.82  0.50 - 1.35 mg/dL    Calcium 8.8  8.4 - 95.6 mg/dL    GFR calc non Af Amer >90  >90 mL/min    GFR calc Af Amer >90  >90 mL/min  ETHANOL     Status: Abnormal   Collection Time   08/06/12  1:03 AM      Component Value Range Comment   Alcohol, Ethyl (B) 259 (*) 0 - 11 mg/dL   CBC WITH DIFFERENTIAL     Status: Abnormal   Collection Time   08/06/12  1:03 AM      Component Value Range Comment   WBC 5.3  4.0 - 10.5 K/uL    RBC 4.38  4.22 - 5.81 MIL/uL    Hemoglobin 13.5  13.0 - 17.0 g/dL    HCT 40.9  81.1 - 91.4 %    MCV 92.0  78.0 - 100.0 fL    MCH 30.8  26.0 - 34.0 pg    MCHC 33.5  30.0 - 36.0 g/dL    RDW 78.2  95.6 - 21.3 %    Platelets 254  150 - 400 K/uL    Neutrophils Relative 82 (*) 43 - 77 %    Neutro Abs 4.3  1.7 - 7.7 K/uL    Lymphocytes Relative 14  12 - 46 %    Lymphs Abs 0.7  0.7 - 4.0 K/uL    Monocytes Relative 4  3 - 12 %    Monocytes Absolute 0.2  0.1 - 1.0 K/uL    Eosinophils Relative 1  0 - 5 %    Eosinophils Absolute 0.1  0.0 - 0.7 K/uL    Basophils Relative 0  0 - 1 %    Basophils Absolute 0.0  0.0 - 0.1 K/uL   PROTIME-INR     Status: Normal   Collection Time   08/06/12  1:03 AM       Component Value Range Comment   Prothrombin Time 12.3  11.6 - 15.2 seconds    INR 0.92  0.00 - 1.49   TYPE AND SCREEN     Status: Normal   Collection Time   08/06/12  1:03 AM      Component Value Range Comment   ABO/RH(D) A POS      Antibody Screen NEG      Sample Expiration 08/09/2012       Dg Hip Complete Left  08/06/2012  *RADIOLOGY REPORT*  Clinical Data: Fall.  Severe left hip pain.  LEFT HIP - COMPLETE 2+ VIEW  Comparison: None.  Findings: No evidence of left hip fracture or dislocation.  Mild to moderate left hip osteoarthritis is seen.  No acetabular or pelvic fracture identified.  IMPRESSION:  1.  No acute findings. 2.  Mild to moderate hip osteoarthritis.   Original Report Authenticated By: Myles Rosenthal, M.D.    Ct Head Wo Contrast  08/06/2012  *RADIOLOGY REPORT*  Clinical Data:  Status post fall; unknown whether the patient lost consciousness.  Concern for head or cervical spine injury. Abrasion to the lateral right side of the head.  CT HEAD WITHOUT CONTRAST AND CT CERVICAL SPINE WITHOUT CONTRAST  Technique:  Multidetector CT imaging of the head and cervical spine was performed following the standard protocol without intravenous contrast.  Multiplanar CT image reconstructions of the cervical spine were also generated.  Comparison: None  CT HEAD  Findings: There is no evidence of acute infarction, mass lesion, or intra- or extra-axial hemorrhage on CT.  Prominence of the ventricles and sulci reflects mild cortical volume loss.  Cerebellar atrophy is noted.  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  Scattered periventricular and subcortical white matter change likely reflects small vessel ischemic  microangiopathy.  The brainstem and fourth ventricle are within normal limits.  The basal ganglia are unremarkable in appearance.  The cerebral hemispheres demonstrate grossly normal gray-white differentiation. No mass effect or midline shift is seen.  There is no  evidence of fracture; visualized osseous structures are unremarkable in appearance.  The orbits are within normal limits. Minimal mucosal thickening is noted within the left maxillary sinus; the remaining paranasal sinuses and mastoid air cells are well-aerated.  No significant soft tissue abnormalities are seen.  IMPRESSION:  1.  No evidence of traumatic intracranial injury or fracture. 2.  Mild cortical volume loss and scattered small vessel ischemic microangiopathy. 3.  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 4.  Minimal mucosal thickening within the left maxillary sinus.  CT CERVICAL SPINE  Findings: There is no evidence of fracture or subluxation. Vertebral bodies demonstrate normal height and alignment.  There is slight narrowing of the intervertebral disc space at C2-C3, with mild associated calcification.  Prevertebral soft tissues are within normal limits.  The visualized neural foramina are grossly unremarkable.  There is incomplete fusion of the posterior elements of C4.  The thyroid gland is unremarkable in appearance.  The minimally visualized lung apices are clear.  No significant soft tissue abnormalities are seen.  IMPRESSION:  1.  No evidence of fracture or subluxation along the cervical spine. 2.  Incidental note of incomplete fusion of the posterior elements of C4.   Original Report Authenticated By: Tonia Ghent, M.D.    Ct Cervical Spine Wo Contrast  08/06/2012  *RADIOLOGY REPORT*  Clinical Data:  Status post fall; unknown whether the patient lost consciousness.  Concern for head or cervical spine injury. Abrasion to the lateral right side of the head.  CT HEAD WITHOUT CONTRAST AND CT CERVICAL SPINE WITHOUT CONTRAST  Technique:  Multidetector CT imaging of the head and cervical spine was performed following the standard protocol without intravenous contrast.  Multiplanar CT image reconstructions of the cervical spine were also generated.  Comparison: None   CT HEAD  Findings: There is no evidence of acute infarction, mass lesion, or intra- or extra-axial hemorrhage on CT.  Prominence of the ventricles and sulci reflects mild cortical volume loss.  Cerebellar atrophy is noted.  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  Scattered periventricular and subcortical white matter change likely reflects small vessel ischemic microangiopathy.  The brainstem and fourth ventricle are within normal limits.  The basal ganglia are unremarkable in appearance.  The cerebral hemispheres demonstrate grossly normal gray-white differentiation. No mass effect or midline shift is seen.  There is no evidence of fracture; visualized osseous structures are unremarkable in appearance.  The orbits are within normal limits. Minimal mucosal thickening is noted within the left maxillary sinus; the remaining paranasal sinuses and mastoid air cells are well-aerated.  No significant soft tissue abnormalities are seen.  IMPRESSION:  1.  No evidence of traumatic intracranial injury or fracture. 2.  Mild cortical volume loss and scattered small vessel ischemic microangiopathy. 3.  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 4.  Minimal mucosal thickening within the left maxillary sinus.  CT CERVICAL SPINE  Findings: There is no evidence of fracture or subluxation. Vertebral bodies demonstrate normal height and alignment.  There is slight narrowing of the intervertebral disc space at C2-C3, with mild associated calcification.  Prevertebral soft tissues are within normal limits.  The visualized neural foramina are grossly unremarkable.  There is incomplete fusion of  the posterior elements of C4.  The thyroid gland is unremarkable in appearance.  The minimally visualized lung apices are clear.  No significant soft tissue abnormalities are seen.  IMPRESSION:  1.  No evidence of fracture or subluxation along the cervical spine. 2.  Incidental  note of incomplete fusion of the posterior elements of C4.   Original Report Authenticated By: Tonia Ghent, M.D.    Mr Hip Left Wo Contrast  08/06/2012  *RADIOLOGY REPORT*  Clinical Data: Status post fall with severe left hip pain. Negative plain films.  MRI OF THE LEFT HIP WITHOUT CONTRAST  Technique:  Multiplanar, multisequence MR imaging was performed. No intravenous contrast was administered.  Comparison: Plain films 08/06/2012 at 1:17 a.m.  Findings: The patient has a nondisplaced acute left intertrochanteric fracture with associated marrow edema.  There is extensive hematoma formation about the patient's fracture, particularly in the gluteus medius and vastus lateralis.  No other fracture is identified.  Both hips are located.  A 1.1 cm in diameter mixed signal intensity lesion in the proximal femur is well circumscribed and likely represents a benign entity such as an enchondroma.  The patient has mild to moderate bilateral hip degenerative change.  There is edema in the right gluteus medius and subcutaneous fat over the greater trochanter likely due to contusion.  Imaged intrapelvic contents demonstrate extensive sigmoid diverticular disease.  IMPRESSION:  1.  Acute left intertrochanteric fracture with extensive hematoma formation present about the fracture. 2.  Findings most consistent with gluteal hematoma on the right. 3.  Mild to moderate bilateral hip degenerative change. 4.  Sigmoid diverticulosis.   Original Report Authenticated By: Holley Dexter, M.D.     Review of Systems  Musculoskeletal: Positive for joint pain.  All other systems reviewed and are negative.   Blood pressure 119/80, pulse 101, temperature 98.9 F (37.2 C), temperature source Oral, resp. rate 18, SpO2 97.00%. Physical Exam  Constitutional: He is oriented to person, place, and time. He appears well-developed.  HENT:  Head: Normocephalic.  Eyes: Pupils are equal, round, and reactive to light.  Neck: Normal range  of motion.  Cardiovascular: Normal rate.   Respiratory: Effort normal.  GI: Soft.  Musculoskeletal: He exhibits tenderness.       Pain left hip with ROM. NVI. No DVT  Neurological: He is alert and oriented to person, place, and time.  Skin: Skin is warm and dry.  2cm nonhealing abdominal wound  Assessment/Plan: Left Intertrochanteric hip fracture. Plan IM nailing. Risks discussed.  Jacquelinne Speak C 08/06/2012, 9:37 AM

## 2012-08-06 NOTE — Brief Op Note (Signed)
08/05/2012 - 08/06/2012  1:23 PM  PATIENT:  Shawn [name]  61 y.o. male  PRE-OPERATIVE DIAGNOSIS:  left hip fracture  POST-OPERATIVE DIAGNOSIS:  left hip fracture  PROCEDURE:  Procedure(s) (LRB) with comments: INTRAMEDULLARY (IM) NAIL FEMORAL (Left)  SURGEON:  Surgeon(s) and Role:    * Javier Docker, MD - Primary  PHYSICIAN ASSISTANT:   ASSISTANTS: Bissell   ANESTHESIA:   general  EBL:  Total I/O In: -  Out: 250 [Urine:250]  BLOOD ADMINISTERED:none  DRAINS: none and Gastrostomy Tube   LOCAL MEDICATIONS USED:  MARCAINE     SPECIMEN:  No Specimen  DISPOSITION OF SPECIMEN:  N/A  COUNTS:  YES  TOURNIQUET:  * No tourniquets in log *  DICTATION: .Other Dictation: Dictation Number [id number]  PLAN OF CARE: Admit to inpatient   PATIENT DISPOSITION:  PACU - hemodynamically stable.   Delay start of Pharmacological VTE agent (>24hrs) due to surgical blood loss or risk of bleeding: yes

## 2012-08-06 NOTE — ED Notes (Signed)
Hospitalist at bedside to eval pt for admission.

## 2012-08-06 NOTE — ED Notes (Signed)
Pt to MRI at this time.

## 2012-08-07 ENCOUNTER — Encounter (HOSPITAL_COMMUNITY): Payer: Self-pay | Admitting: Specialist

## 2012-08-07 DIAGNOSIS — F3161 Bipolar disorder, current episode mixed, mild: Secondary | ICD-10-CM

## 2012-08-07 DIAGNOSIS — S72143A Displaced intertrochanteric fracture of unspecified femur, initial encounter for closed fracture: Principal | ICD-10-CM

## 2012-08-07 DIAGNOSIS — R030 Elevated blood-pressure reading, without diagnosis of hypertension: Secondary | ICD-10-CM

## 2012-08-07 LAB — CBC
Platelets: 239 10*3/uL (ref 150–400)
RBC: 3.57 MIL/uL — ABNORMAL LOW (ref 4.22–5.81)
WBC: 7.5 10*3/uL (ref 4.0–10.5)

## 2012-08-07 LAB — BASIC METABOLIC PANEL
CO2: 27 mEq/L (ref 19–32)
Calcium: 7.9 mg/dL — ABNORMAL LOW (ref 8.4–10.5)
Chloride: 104 mEq/L (ref 96–112)
Sodium: 138 mEq/L (ref 135–145)

## 2012-08-07 LAB — ABO/RH: ABO/RH(D): A POS

## 2012-08-07 MED ORDER — ADULT MULTIVITAMIN W/MINERALS CH
1.0000 | ORAL_TABLET | Freq: Every day | ORAL | Status: DC
  Administered 2012-08-07 – 2012-08-12 (×6): 1 via ORAL
  Filled 2012-08-07 (×6): qty 1

## 2012-08-07 MED ORDER — ENSURE COMPLETE PO LIQD
237.0000 mL | Freq: Four times a day (QID) | ORAL | Status: DC
  Administered 2012-08-07 – 2012-08-12 (×16): 237 mL via ORAL

## 2012-08-07 MED ORDER — BUPROPION HCL ER (SR) 150 MG PO TB12
150.0000 mg | ORAL_TABLET | Freq: Two times a day (BID) | ORAL | Status: DC
  Administered 2012-08-07 – 2012-08-12 (×10): 150 mg via ORAL
  Filled 2012-08-07 (×11): qty 1

## 2012-08-07 NOTE — Progress Notes (Signed)
Clinical Social Work Department BRIEF PSYCHOSOCIAL ASSESSMENT 08/07/2012  Patient:  Shawn [name], Shawn [name]     Account Number:  [id number]     Admit date:  08/05/2012  Clinical Social Worker:  Candie Chroman  Date/Time:  08/07/2012 02:55 PM  Referred by:  Physician  Date Referred:   Referred for  SNF Placement   Other Referral:   Interview type:  Patient Other interview type:    PSYCHOSOCIAL DATA Living Status:  ALONE Admitted from facility:   Level of care:   Primary support name:  Shawn [name] Primary support relationship to patient:  FRIEND Degree of support available:   unclear    CURRENT CONCERNS Current Concerns  Post-Acute Placement   Other Concerns:    SOCIAL WORK ASSESSMENT / PLAN Pt is a 61 yr old gentleman living at home prior to hospitalization. CSW met with pt to assist with d/c planning. Pt will reqiure ST Rehab prior to d/c home. Pt is in agreement with this plan. SNF search has been initiated and [employer] insurance contacted to begin prior approval process. CSW will follow to assist with d/c plannin gto SNF.   Assessment/plan status:  Psychosocial Support/Ongoing Assessment of Needs Other assessment/ plan:   Information/referral to community resources:   SNF list provided.    PATIENT'S/FAMILY'S RESPONSE TO PLAN OF CARE: Pt has been to [facility] in the past and would like to return for rehab. He will consider other options if [facility] has no openings.    Cori Razor LCSW [phone]

## 2012-08-07 NOTE — Progress Notes (Signed)
1 Day Post-Op  Subjective: Seen at patient request to check his abdominal wound. I know him status post laparotomy and small bowel resection for incarcerated hernia. He is now unfortunately fallen and broken his hip and is hospitalized following ORIF. He has no abdominal complaints.  Objective: Vital signs in last 24 hours: Temp:  [97.7 F (36.5 C)-98.9 F (37.2 C)] 98 F (36.7 C) (12/06 0510) Pulse Rate:  [72-96] 77  (12/06 0510) Resp:  [12-20] 20  (12/06 0510) BP: (117-151)/(75-92) 117/75 mmHg (12/06 0510) SpO2:  [91 %-100 %] 97 % (12/06 0510) Weight:  [191 lb (86.637 kg)] 191 lb (86.637 kg) (12/05 1948) Last BM Date: 08/05/12  Intake/Output from previous day: 12/05 0701 - 12/06 0700 In: 2341.7 [P.O.:480; I.V.:1806.7; IV Piggyback:55] Out: 1300 [Urine:1300] Intake/Output this shift: Total I/O In: 240 [P.O.:240] Out: -   GI: normal findings: soft, non-tender Incision/Wound: midline wound is packed open, very clean granulation tissue and closing well  Lab Results:   [facility] 08/07/12 0415 08/06/12 1830  WBC 7.5 7.2  HGB 11.0* 12.9*  HCT 32.6* 38.2*  PLT 239 232   BMET  Basename 08/07/12 0415 08/06/12 0103  NA 138 148*  K 3.8 3.7  CL 104 110  CO2 27 24  GLUCOSE 152* 111*  BUN 9 9  CREATININE 0.70 0.85  CALCIUM 7.9* 8.8     Studies/Results: Dg Hip Complete Left  08/06/2012  *RADIOLOGY REPORT*  Clinical Data: Fall.  Severe left hip pain.  LEFT HIP - COMPLETE 2+ VIEW  Comparison: None.  Findings: No evidence of left hip fracture or dislocation.  Mild to moderate left hip osteoarthritis is seen.  No acetabular or pelvic fracture identified.  IMPRESSION:  1.  No acute findings. 2.  Mild to moderate hip osteoarthritis.   Original Report Authenticated By: Myles Rosenthal, M.D.    Dg Hip Operative Left  08/06/2012  *RADIOLOGY REPORT*  Clinical Data: Left hip fracture.  OPERATIVE LEFT HIP  Comparison: MRI dated 08/06/2012 and radiographs dated 08/06/2012  Findings: The  patient has undergone open reduction and internal fixation of the nondisplaced intertrochanteric fracture of the proximal left femur.  Intramedullary rod and compression screw have been inserted.  Alignment and position is anatomic.  IMPRESSION: Satisfactory appearance of the left hip after open reduction and internal fixation of the intertrochanteric fracture.   Original Report Authenticated By: Francene Boyers, M.D.    Ct Head Wo Contrast  08/06/2012  *RADIOLOGY REPORT*  Clinical Data:  Status post fall; unknown whether the patient lost consciousness.  Concern for head or cervical spine injury. Abrasion to the lateral right side of the head.  CT HEAD WITHOUT CONTRAST AND CT CERVICAL SPINE WITHOUT CONTRAST  Technique:  Multidetector CT imaging of the head and cervical spine was performed following the standard protocol without intravenous contrast.  Multiplanar CT image reconstructions of the cervical spine were also generated.  Comparison: None  CT HEAD  Findings: There is no evidence of acute infarction, mass lesion, or intra- or extra-axial hemorrhage on CT.  Prominence of the ventricles and sulci reflects mild cortical volume loss.  Cerebellar atrophy is noted.  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  Scattered periventricular and subcortical white matter change likely reflects small vessel ischemic microangiopathy.  The brainstem and fourth ventricle are within normal limits.  The basal ganglia are unremarkable in appearance.  The cerebral hemispheres demonstrate grossly normal gray-white differentiation. No mass effect or midline shift is seen.  There is no  evidence of fracture; visualized osseous structures are unremarkable in appearance.  The orbits are within normal limits. Minimal mucosal thickening is noted within the left maxillary sinus; the remaining paranasal sinuses and mastoid air cells are well-aerated.  No significant soft tissue abnormalities are seen.   IMPRESSION:  1.  No evidence of traumatic intracranial injury or fracture. 2.  Mild cortical volume loss and scattered small vessel ischemic microangiopathy. 3.  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 4.  Minimal mucosal thickening within the left maxillary sinus.  CT CERVICAL SPINE  Findings: There is no evidence of fracture or subluxation. Vertebral bodies demonstrate normal height and alignment.  There is slight narrowing of the intervertebral disc space at C2-C3, with mild associated calcification.  Prevertebral soft tissues are within normal limits.  The visualized neural foramina are grossly unremarkable.  There is incomplete fusion of the posterior elements of C4.  The thyroid gland is unremarkable in appearance.  The minimally visualized lung apices are clear.  No significant soft tissue abnormalities are seen.  IMPRESSION:  1.  No evidence of fracture or subluxation along the cervical spine. 2.  Incidental note of incomplete fusion of the posterior elements of C4.   Original Report Authenticated By: Tonia Ghent, M.D.    Ct Cervical Spine Wo Contrast  08/06/2012  *RADIOLOGY REPORT*  Clinical Data:  Status post fall; unknown whether the patient lost consciousness.  Concern for head or cervical spine injury. Abrasion to the lateral right side of the head.  CT HEAD WITHOUT CONTRAST AND CT CERVICAL SPINE WITHOUT CONTRAST  Technique:  Multidetector CT imaging of the head and cervical spine was performed following the standard protocol without intravenous contrast.  Multiplanar CT image reconstructions of the cervical spine were also generated.  Comparison: None  CT HEAD  Findings: There is no evidence of acute infarction, mass lesion, or intra- or extra-axial hemorrhage on CT.  Prominence of the ventricles and sulci reflects mild cortical volume loss.  Cerebellar atrophy is noted.  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  Scattered  periventricular and subcortical white matter change likely reflects small vessel ischemic microangiopathy.  The brainstem and fourth ventricle are within normal limits.  The basal ganglia are unremarkable in appearance.  The cerebral hemispheres demonstrate grossly normal gray-white differentiation. No mass effect or midline shift is seen.  There is no evidence of fracture; visualized osseous structures are unremarkable in appearance.  The orbits are within normal limits. Minimal mucosal thickening is noted within the left maxillary sinus; the remaining paranasal sinuses and mastoid air cells are well-aerated.  No significant soft tissue abnormalities are seen.  IMPRESSION:  1.  No evidence of traumatic intracranial injury or fracture. 2.  Mild cortical volume loss and scattered small vessel ischemic microangiopathy. 3.  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 4.  Minimal mucosal thickening within the left maxillary sinus.  CT CERVICAL SPINE  Findings: There is no evidence of fracture or subluxation. Vertebral bodies demonstrate normal height and alignment.  There is slight narrowing of the intervertebral disc space at C2-C3, with mild associated calcification.  Prevertebral soft tissues are within normal limits.  The visualized neural foramina are grossly unremarkable.  There is incomplete fusion of the posterior elements of C4.  The thyroid gland is unremarkable in appearance.  The minimally visualized lung apices are clear.  No significant soft tissue abnormalities are seen.  IMPRESSION:  1.  No evidence of fracture or subluxation  along the cervical spine. 2.  Incidental note of incomplete fusion of the posterior elements of C4.   Original Report Authenticated By: Tonia Ghent, M.D.    Mr Hip Left Wo Contrast  08/06/2012  *RADIOLOGY REPORT*  Clinical Data: Status post fall with severe left hip pain. Negative plain films.  MRI OF THE LEFT HIP WITHOUT CONTRAST  Technique:   Multiplanar, multisequence MR imaging was performed. No intravenous contrast was administered.  Comparison: Plain films 08/06/2012 at 1:17 a.m.  Findings: The patient has a nondisplaced acute left intertrochanteric fracture with associated marrow edema.  There is extensive hematoma formation about the patient's fracture, particularly in the gluteus medius and vastus lateralis.  No other fracture is identified.  Both hips are located.  A 1.1 cm in diameter mixed signal intensity lesion in the proximal femur is well circumscribed and likely represents a benign entity such as an enchondroma.  The patient has mild to moderate bilateral hip degenerative change.  There is edema in the right gluteus medius and subcutaneous fat over the greater trochanter likely due to contusion.  Imaged intrapelvic contents demonstrate extensive sigmoid diverticular disease.  IMPRESSION:  1.  Acute left intertrochanteric fracture with extensive hematoma formation present about the fracture. 2.  Findings most consistent with gluteal hematoma on the right. 3.  Mild to moderate bilateral hip degenerative change. 4.  Sigmoid diverticulosis.   Original Report Authenticated By: Holley Dexter, M.D.     Anti-infectives: Anti-infectives     Start     Dose/Rate Route Frequency Ordered Stop   08/06/12 2200   clindamycin (CLEOCIN) IVPB 600 mg        600 mg 100 mL/hr over 30 Minutes Intravenous 3 times per day 08/06/12 1859 08/07/12 0633   08/06/12 2000   ceFAZolin (ANCEF) IVPB 2 g/50 mL premix        2 g 100 mL/hr over 30 Minutes Intravenous Every 6 hours 08/06/12 1859 08/07/12 0157   08/06/12 1327   polymyxin B 500,000 Units, bacitracin 50,000 Units in sodium chloride irrigation 0.9 % 500 mL irrigation  Status:  Discontinued          As needed 08/06/12 1327 08/06/12 1335   08/06/12 1215   clindamycin (CLEOCIN) IVPB 900 mg  Status:  Discontinued        900 mg 100 mL/hr over 30 Minutes Intravenous 3 times per day 08/06/12 1208  08/06/12 1542          Assessment/Plan: s/p Procedure(s): INTRAMEDULLARY (IM) NAIL FEMORAL Status post laparotomy and small bowel resection. His midline wound is clean and healing very nicely. Continue daily moist saline wound packing.   LOS: 2 days    Jhene Westmoreland T 08/07/2012

## 2012-08-07 NOTE — Progress Notes (Signed)
Utilization review completed.  

## 2012-08-07 NOTE — Progress Notes (Signed)
[facility] PROGRESS NOTE  Shawn [name] JYN:[id number] DOB: 28-Apr-1951 DOA: 08/05/2012 PCP: Jeoffrey Massed, MD  Assessment/Plan: Principal Problem:  *Hip fracture, intertrochanteric: Status post repair yesterday. Stable. Active Problems:   Traumatic hematoma of buttock: Patient with right gluteal hematoma status post fall. Hemoglobin be monitored after surgery.  ETOH abuse currently on CIWA protocol. Watching for withdrawal.  H/O: substance abuse  Tobacco abuse: Counseled. On nicotine patch.  History of hernia repair: Appreciate surgery consult. Stable.   Code Status: Full Family Communication: Patient declined me to give an update to anyone. Disposition Plan: Short-term skilled nursing facility on Monday   Consultants:  [facility]  General surgery  Procedures:  Status post ORIF yesterday.  Antibiotics:  None  HPI/Subjective: Patient feeling okay. Some soreness. Says that it is relatively well controlled with medication.  Objective: Filed Vitals:   08/07/12 0120 08/07/12 0400 08/07/12 0510 08/07/12 1408  BP: 125/78  117/75 135/79  Pulse: 72  77 92  Temp: 98.8 F (37.1 C)  98 F (36.7 C) 98.5 F (36.9 C)  TempSrc: Oral  Oral   Resp: 12 13 20 22   Height:      Weight:      SpO2: 96% 96% 97% 98%    Intake/Output Summary (Last 24 hours) at 08/07/12 1525 Last data filed at 08/07/12 1458  Gross per 24 hour  Intake 1646.67 ml  Output   1700 ml  Net -53.33 ml   Filed Weights   08/06/12 1948  Weight: 86.637 kg (191 lb)    Exam:   General:  Alert and oriented x3, minimal distress secondary to pain  Cardiovascular: Regular rate and rhythm, S1-S2  Respiratory: Clear to auscultation bilaterally  Abdomen: Soft, nontender, nondistended, hypoactive bowel sounds  Extremities: No clubbing or cyanosis, trace pitting edema, wound site clear, no drainage  Data Reviewed: Basic Metabolic Panel:  Lab 08/07/12 8657 08/06/12 0103  NA 138 148*  K  3.8 3.7  CL 104 110  CO2 27 24  GLUCOSE 152* 111*  BUN 9 9  CREATININE 0.70 0.85  CALCIUM 7.9* 8.8  MG -- --  PHOS -- --   CBC:  Lab 08/07/12 0415 08/06/12 1830 08/06/12 0103  WBC 7.5 7.2 5.3  NEUTROABS -- -- 4.3  HGB 11.0* 12.9* 13.5  HCT 32.6* 38.2* 40.3  MCV 91.3 92.5 92.0  PLT 239 232 254     Recent Results (from the past 240 hour(s))  NASAL CULTURE (N/P)     Status: Normal (Preliminary result)   Collection Time   08/06/12 11:03 AM      Component Value Range Status Comment   Specimen Description NASOPHARYNGEAL   Final    Special Requests NONE   Final    Culture Culture reincubated for better growth   Final    Report Status PENDING   Incomplete      Studies: Dg Hip Complete Left  08/06/2012   IMPRESSION:  1.  No acute findings. 2.  Mild to moderate hip osteoarthritis.   Original Report Authenticated By: Myles Rosenthal, M.D.    Dg Hip Operative Left  08/06/2012  .  IMPRESSION: Satisfactory appearance of the left hip after open reduction and internal fixation of the intertrochanteric fracture.   Original Report Authenticated By: Francene Boyers, M.D.    Ct Head Wo Contrast  08/06/2012   IMPRESSION:  1.  No evidence of traumatic intracranial injury or fracture. 2.  Mild cortical volume loss and scattered small vessel ischemic microangiopathy. 3.  Mild  chronic infarct suggested at the cerebellar hemispheres bilaterally. 4.  Minimal mucosal thickening within the left maxillary sinus.    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   Original Report Authenticated By: Tonia Ghent, M.D.     Mr Hip Left Wo Contrast  08/06/2012   IMPRESSION:  1.  Acute left intertrochanteric fracture with extensive hematoma formation present about the fracture. 2.  Findings most consistent with gluteal hematoma on the right. 3.  Mild to moderate bilateral hip degenerative change. 4.  Sigmoid diverticulosis.   Original Report Authenticated By: Holley Dexter, M.D.     Scheduled Meds:   . [COMPLETED]  ceFAZolin (ANCEF) IV  2 g Intravenous Q6H  . [COMPLETED] clindamycin (CLEOCIN) IV  600 mg Intravenous Q8H  . enoxaparin (LOVENOX) injection  40 mg Subcutaneous Q24H  . feeding supplement  237 mL Oral QID  . [EXPIRED] HYDROmorphone      . [COMPLETED] HYDROmorphone      . multivitamin with minerals  1 tablet Oral Daily  . nicotine  21 mg Transdermal Daily  . thiamine  100 mg Oral Daily  . [DISCONTINUED] clindamycin (CLEOCIN) IV  900 mg Intravenous Q8H  . [DISCONTINUED] docusate sodium  100 mg Oral BID  . [DISCONTINUED] folic acid  1 mg Oral Daily   Continuous Infusions:   . dextrose 5 % and 0.45 % NaCl with KCl 20 mEq/L Stopped (08/07/12 0756)  . [DISCONTINUED] sodium chloride    . [DISCONTINUED] lactated ringers    . [DISCONTINUED] lactated ringers      Principal Problem:  *Hip fracture, intertrochanteric Active Problems:  Hip fracture, left  Traumatic hematoma of buttock  ETOH abuse  H/O: substance abuse  Tobacco abuse  History of hernia repair    Time spent: 15 minutes    Hollice Espy  [facility] Pager [phone]. If 8PM-8AM, please contact night-coverage at [email or web address], password [facility] 08/07/2012, 3:25 PM  LOS: 2 days

## 2012-08-07 NOTE — Op Note (Signed)
NAME:  Shawn [name], Shawn [name] NO.:  [id number]  MEDICAL RECORD NO.:  [id number]  LOCATION:  1604                         FACILITY:  [facility]  PHYSICIAN:  Jene Every, M.D.    DATE OF BIRTH:  06-Oct-1950  DATE OF PROCEDURE: DATE OF DISCHARGE:                              OPERATIVE REPORT   PREOPERATIVE DIAGNOSIS:  Left intertrochanteric hip fracture.  POSTOPERATIVE DIAGNOSIS:  Left intertrochanteric hip fracture.  PROCEDURE PERFORMED:  Intramedullary nailing of left proximal femur.  ANESTHESIA:  General.  ASSISTANT:  Lanna Poche, PA, was utilized for rotation of the extremity soft tissue retraction and closure.  HISTORY:  This 61 year old fell sustaining a fracture, indicated for fixation.  Risks and benefits were discussed including bleeding, infection, DVT, PE, anesthetic complications, etc.  TECHNIQUE:  With the patient in supine position after induction of adequate general anesthesia, 2 g Kefzol, 900 clindamycin placed on the fracture table.  All bony prominences well padded.  Peroneal post placed to left lower extremity in gentle longitudinal traction, slight internal rotation.  Hip was prepped and draped in usual sterile fashion, umbilical hernia.  Wound healing was kept out of the operative area and covered.  Under x-ray, the fracture was reduced to slight internal rotation.  I made a small incision after prepping and draping, just proximal to the trochanter, subcutaneous tissue was dissected.  Cautery was utilized to achieve hemostasis.  The fascia was divided in line with skin incision.  The trochanter was identified with the guidewire, hip, and the intramedullary canal I entered.  Over reamed and selected 11 short troch nail to fix this [employer].  135 inserted it without difficulty with the external alignment jig.  We then made a small incision laterally and with external alignment guide, we drilled a guide pin up to the femoral head and neck,  measured it to a 100, over drilled to 100 and then put a 100 lag screw, compressed it at the site and locked it proximally in the AP and lateral plane, satisfactory plate with distal locking screw in the dynamic slot.  After the incision was made and blunt dissection down to the femur insertion of the screw, 36 mm in length, excellent purchase.  The AP and lateral plane, excellent fixation, excellent placement of the hardware.  External alignment hardware was removed after it was locked proximally.  Copious irrigation was utilized to close the fascia with 1 Vicryl, subcu with 2-0 Vicryl, skin with staples.  Wound was dressed sterilely.  There was no traction applied at the fracture site given that was not displaced and it was decompressed.  He was placed supine on hospital bed, leg lengths were equivalent and pulses intact, normal rotation, transported to the recovery in satisfactory condition.  Tolerated procedure well.  No complications.  Blood loss 100 mL.  Assistant was Lanna Poche, [place].     Jene Every, M.D.     Cordelia Pen  D:  08/06/2012  T:  08/07/2012  Job:  [id number]

## 2012-08-07 NOTE — Evaluation (Signed)
Physical Therapy Evaluation Patient Details Name: Shawn [name] MRN: [id number] DOB: 1950-12-23 Today's Date: 08/07/2012 Time: 1010-1043 PT Time Calculation (min): 33 min  PT Assessment / Plan / Recommendation Clinical Impression  61 yo male s/p IM nail L proximal femur. Experiencing significant pain at rest and with activity. Hesitant to mobilize but agreeable after moderate encourgement. On eval, required +2 assisst for mobility. Recommend SNF for ST rehab.     PT Assessment  Patient needs continued PT services    Follow Up Recommendations  SNF    Does the patient have the potential to tolerate intense rehabilitation      Barriers to Discharge Decreased caregiver support      Equipment Recommendations  None recommended by PT    Recommendations for Other Services OT consult   Frequency Min 3X/week    Precautions / Restrictions Precautions Precautions: Fall Restrictions Weight Bearing Restrictions: Yes LLE Weight Bearing: Partial weight bearing LLE Partial Weight Bearing Percentage or Pounds: % not indicated in order set   Pertinent Vitals/Pain 7/10 L LE      Mobility  Bed Mobility Bed Mobility: Supine to Sit Supine to Sit: 1: +2 Total assist Supine to Sit: Patient Percentage: 70% Details for Bed Mobility Assistance: Increased time. Assist for L LE slowly off bed. Pt self-directs pace.  Transfers Transfers: Sit to Stand;Stand to Sit Sit to Stand: 1: +2 Total assist;From bed;From elevated surface Sit to Stand: Patient Percentage: 60% Stand to Sit: 1: +2 Total assist;To bed;With upper extremity assist;To elevated surface Stand to Sit: Patient Percentage: 60% Details for Transfer Assistance: VCs safety, technique, hand placement. Assist to rise, stabilize, control descent. Pt able to stand for ~10 seconds before sitting abruptly. Declined stand pivot to recliner.  Ambulation/Gait Ambulation/Gait Assistance: Not tested (comment)    Shoulder Instructions      Exercises General Exercises - Lower Extremity Ankle Circles/Pumps: AROM;Left;10 reps;Seated   PT Diagnosis: Difficulty walking;Abnormality of gait;Acute pain  PT Problem List: Decreased strength;Decreased range of motion;Decreased activity tolerance;Decreased mobility;Pain;Decreased knowledge of precautions;Decreased knowledge of use of DME PT Treatment Interventions: DME instruction;Gait training;Functional mobility training;Therapeutic activities;Therapeutic exercise;Patient/family education   PT Goals Acute Rehab PT Goals PT Goal Formulation: With patient Time For Goal Achievement: 08/21/12 Potential to Achieve Goals: Good Pt will go Supine/Side to Sit: with min assist PT Goal: Supine/Side to Sit - Progress: Goal set today Pt will go Sit to Supine/Side: with min assist PT Goal: Sit to Supine/Side - Progress: Goal set today Pt will go Sit to Stand: with mod assist PT Goal: Sit to Stand - Progress: Goal set today Pt will Transfer Bed to Chair/Chair to Bed: with mod assist PT Transfer Goal: Bed to Chair/Chair to Bed - Progress: Goal set today Pt will Ambulate: 1 - 15 feet;with mod assist;with rolling walker PT Goal: Ambulate - Progress: Goal set today  Visit Information  Last PT Received On: 08/07/12 Assistance Needed: +2 PT/OT Co-Evaluation/Treatment: Yes    Subjective Data  Subjective: "I don't know about this!" Patient Stated Goal: Rehab   Prior Functioning  Home Living Lives With: Alone Type of Home: House Additional Comments: will likely need STSNF Prior Function Comments: was walking on [place]; feel over rock Communication Communication: No difficulties    Cognition  Overall Cognitive Status: Appears within functional limits for tasks assessed/performed Arousal/Alertness: Awake/alert Orientation Level: Appears intact for tasks assessed Behavior During Session: [facility] for tasks performed    Extremity/Trunk Assessment Right Upper Extremity Assessment RUE  ROM/Strength/Tone: [facility] for tasks assessed (  sore from fall) Left Upper Extremity Assessment LUE ROM/Strength/Tone: Within functional levels Right Lower Extremity Assessment RLE ROM/Strength/Tone: WFL for tasks assessed Left Lower Extremity Assessment LLE ROM/Strength/Tone: Unable to fully assess;Due to pain   Balance    End of Session PT - End of Session Activity Tolerance: Patient limited by pain Patient left: in bed;with call bell/phone within reach  GP     [name] [facility] 08/07/2012, 12:44 PM [phone]

## 2012-08-07 NOTE — Progress Notes (Signed)
Made several attempts to have pt dangle tonight. Pt would make an attempt and would state "it's just not time". Educated patient on the importance of sitting up on side of bed. Will continue to monitor.

## 2012-08-07 NOTE — Progress Notes (Signed)
Clinical Social Work Department CLINICAL SOCIAL WORK PLACEMENT NOTE 08/07/2012  Patient:  Shawn [name], Shawn [name]  Account Number:  [id number] Admit date:  08/05/2012  Clinical Social Worker:  Cori Razor, LCSW  Date/time:  08/07/2012 03:12 PM  Clinical Social Work is seeking post-discharge placement for this patient at the following level of care:   SKILLED NURSING   (*CSW will update this form in Epic as items are completed)   08/07/2012  Patient/family provided with [facility] Department of Clinical Social Work's list of facilities offering this level of care within the geographic area requested by the patient (or if unable, by the patient's family).  08/07/2012  Patient/family informed of their freedom to choose among providers that offer the needed level of care, that participate in Medicare, Medicaid or managed care program needed by the patient, have an available bed and are willing to accept the patient.    Patient/family informed of [facility]' ownership interest in [facility], as well as of the fact that they are under no obligation to receive care at this facility.  PASARR submitted to EDS on 08/07/2012 PASARR number received from EDS on   FL2 transmitted to all facilities in geographic area requested by pt/family on  08/07/2012 FL2 transmitted to all facilities within larger geographic area on   Patient informed that his/her managed care company has contracts with or will negotiate with  certain facilities, including the following:     Patient/family informed of bed offers received:   Patient chooses bed at  Physician recommends and patient chooses bed at    Patient to be transferred to  on   Patient to be transferred to facility by   The following physician request were entered in Epic:   Additional Comments:  Cori Razor LCSW [phone]

## 2012-08-07 NOTE — Progress Notes (Signed)
Subjective: 1 Day Post-Op Procedure(s) (LRB): INTRAMEDULLARY (IM) NAIL FEMORAL (Left) Patient reports pain as moderate, with movement of left leg. Did not get much sleep overnight, mostly due to pain. Denies numbness or tingling. Denies calf pain.  Objective: Vital signs in last 24 hours: Temp:  [97.7 F (36.5 C)-98.9 F (37.2 C)] 98 F (36.7 C) (12/06 0510) Pulse Rate:  [72-101] 77  (12/06 0510) Resp:  [12-20] 20  (12/06 0510) BP: (117-151)/(75-92) 117/75 mmHg (12/06 0510) SpO2:  [87 %-100 %] 97 % (12/06 0510) Weight:  [86.637 kg (191 lb)] 86.637 kg (191 lb) (12/05 1948)  Intake/Output from previous day: 12/05 0701 - 12/06 0700 In: 2341.7 [P.O.:480; I.V.:1806.7; IV Piggyback:55] Out: 1300 [Urine:1300] Intake/Output this shift:     Basename 08/07/12 0415 08/06/12 1830 08/06/12 0103  HGB 11.0* 12.9* 13.5    Basename 08/07/12 0415 08/06/12 1830  WBC 7.5 7.2  RBC 3.57* 4.13*  HCT 32.6* 38.2*  PLT 239 232    Basename 08/07/12 0415 08/06/12 0103  NA 138 148*  K 3.8 3.7  CL 104 110  CO2 27 24  BUN 9 9  CREATININE 0.70 0.85  GLUCOSE 152* 111*  CALCIUM 7.9* 8.8    Basename 08/06/12 0103  LABPT --  INR 0.92    Neurologically intact Neurovascular intact Sensation intact distally Intact pulses distally Dorsiflexion/Plantar flexion intact Incision: dressing C/D/I and no drainage Compartment soft No calf pain, negative homans  Assessment/Plan: 1 Day Post-Op Procedure(s) (LRB): INTRAMEDULLARY (IM) NAIL FEMORAL (Left) Advance diet Up with therapy PWB LLE D/C planning- Discussed possible SNF stay vs home with homecare when ready for D/C  Shawn [name] M. 08/07/2012, 7:45 AM

## 2012-08-07 NOTE — Progress Notes (Signed)
INITIAL ADULT NUTRITION ASSESSMENT Date: 08/07/2012   Time: 1:25 PM Reason for Assessment: Consult  INTERVENTION: Ensure Complete QID. Encouraged intake of small frequent meals to improve appetite. Multivitamin 1 tablet PO daily. Will monitor.    ASSESSMENT: Male 61 y.o.  Dx: Hip fracture, intertrochanteric  Food/Nutrition Related Hx: Pt reports poor appetite for the past 3-5 months. Pt reports since his hernia repair 2 months ago he started drinking Ensure Plus, 4 cans/day. Pt reports typically only eating 1 meal/day. Pt reports his usual weight is 185 pounds, now weighs 191 pounds. Pt reports drinking 4-5 beers/day and has been counseled to stop drinking and is getting thiamine. Pt reports his appetite has improved and noted pt ate 100% of lunch tray.   Hx:  Past Medical History  Diagnosis Date  . Osteoarthritis X years    left knee.  Distant hx (age 8) "dislocated" knee and required surgery, then crush injury to patella required knee cap removal.  . Diverticular disease     "itis" x 2 episodes (last was about 2007)  . Tobacco dependence   . Multiple rib fractures 10/09/10    s/p fall down a ravine--9th and 10th on right, contusion of left 7th and 8th ribs  . Fracture of metatarsal bone(s), closed     3rd, 4th, 5th mid shaft on left foot  . History of substance abuse     cocaine, marijuana, acid, alcohol (in remission since 2010)  . Solar dermatitis     face and ears  . Bipolar disorder     Has had multiple psych hospitalization in the past, most recently at [facility] 01/31/11-02/04/11.  Says meds made him emotionally blunted. (lamictal and wellbutrin).  . Alcoholism     "Dry" since 2002  . Depression    Related Meds:  Scheduled Meds:   . [COMPLETED]  ceFAZolin (ANCEF) IV  2 g Intravenous Q6H  . [COMPLETED] clindamycin (CLEOCIN) IV  600 mg Intravenous Q8H  . enoxaparin (LOVENOX) injection  40 mg Subcutaneous Q24H  . [EXPIRED] HYDROmorphone      . [COMPLETED]  HYDROmorphone      . nicotine  21 mg Transdermal Daily  . thiamine  100 mg Oral Daily  . [DISCONTINUED] clindamycin (CLEOCIN) IV  900 mg Intravenous Q8H  . [DISCONTINUED] docusate sodium  100 mg Oral BID  . [DISCONTINUED] folic acid  1 mg Oral Daily   Continuous Infusions:   . dextrose 5 % and 0.45 % NaCl with KCl 20 mEq/L Stopped (08/07/12 0756)  . [DISCONTINUED] sodium chloride    . [DISCONTINUED] lactated ringers    . [DISCONTINUED] lactated ringers     PRN Meds:.acetaminophen, acetaminophen, bisacodyl, HYDROcodone-acetaminophen, HYDROmorphone (DILAUDID) injection, menthol-cetylpyridinium, methocarbamol (ROBAXIN) IV, methocarbamol, metoCLOPramide (REGLAN) injection, metoCLOPramide, ondansetron (ZOFRAN) IV, ondansetron, oxyCODONE, phenol, senna-docusate, [EXPIRED] sodium phosphate, [DISCONTINUED] HYDROcodone-acetaminophen, [DISCONTINUED]  HYDROmorphone (DILAUDID) injection [DISCONTINUED]  HYDROmorphone (DILAUDID) injection, [DISCONTINUED] polymyxin / bacitracin (DOUBLE ANTIBIOTIC) irrigation  Ht: 6\' 1"  (185.4 cm)  Wt: 191 lb (86.637 kg)  Ideal Wt: 184 lb % Ideal Wt: 104  Usual Wt: 185 lb % Usual Wt: 103  Body mass index is 25.20 kg/(m^2).  Labs:  CMP     Component Value Date/Time   NA 138 08/07/2012 0415   K 3.8 08/07/2012 0415   CL 104 08/07/2012 0415   CO2 27 08/07/2012 0415   GLUCOSE 152* 08/07/2012 0415   BUN 9 08/07/2012 0415   CREATININE 0.70 08/07/2012 0415   CREATININE 0.76 09/04/2011 1130   CALCIUM  7.9* 08/07/2012 0415   PROT 6.9 06/15/2012 1232   ALBUMIN 3.5 06/15/2012 1232   AST 11 06/15/2012 1232   ALT 7 06/15/2012 1232   ALKPHOS 72 06/15/2012 1232   BILITOT 0.5 06/15/2012 1232   GFRNONAA >90 08/07/2012 0415   GFRAA >90 08/07/2012 0415    Intake/Output Summary (Last 24 hours) at 08/07/12 1331 Last data filed at 08/07/12 1002  Gross per 24 hour  Intake 1581.67 ml  Output   1200 ml  Net 381.67 ml   Last BM - 12/4   Diet Order: General   IVF:     dextrose 5 % and 0.45 % NaCl with KCl 20 mEq/L Last Rate: Stopped (08/07/12 0756)  [DISCONTINUED] sodium chloride   [DISCONTINUED] lactated ringers   [DISCONTINUED] lactated ringers     Estimated Nutritional Needs:   Kcal:2200-2400 Protein:90-105g Fluid:2.2-2.4L  NUTRITION DIAGNOSIS: -Predicted suboptimal energy intake (NI-1.6).  Status: Ongoing  RELATED TO: poor appetite PTA  AS EVIDENCE BY: pt statement  MONITORING/EVALUATION(Goals): Pt to consume 100% of meals and supplements.   EDUCATION NEEDS: -No education needs identified at this time  Dietitian #: [phone]  DOCUMENTATION CODES Per approved criteria  -Not Applicable    Marshall Cork 08/07/2012, 1:25 PM

## 2012-08-07 NOTE — Evaluation (Signed)
Occupational Therapy Evaluation Patient Details Name: Shawn [name] MRN: [id number] DOB: [birth year] Today's Date: 08/07/2012 Time: 1010-1043 OT Time Calculation (min): 33 min  OT Assessment / Plan / Recommendation Clinical Impression  This 61 year old man [place] and [place] a L femur fx.  He was surgically managed with an ORIF and is PWB.  he will benefit from skilled Ot to increase independence with adls.    OT Assessment  Patient needs continued OT Services    Follow Up Recommendations  SNF    Barriers to Discharge      Equipment Recommendations  None recommended by OT    Recommendations for Other Services    Frequency  Min 2X/week    Precautions / Restrictions Restrictions Weight Bearing Restrictions: Yes LLE Weight Bearing: Partial weight bearing   Pertinent Vitals/Pain LLE 7/10.  He has at least 4/10 pain even with pain medication.  Repositioned and ice applied    ADL  Eating/Feeding: Simulated;Independent Where Assessed - Eating/Feeding: Bed level Grooming: Simulated;Set up Where Assessed - Grooming: Unsupported sitting Upper Body Bathing: Simulated;Set up Where Assessed - Upper Body Bathing: Unsupported sitting Lower Body Bathing: Simulated;+2 Total assistance Lower Body Bathing: Patient Percentage: 20% Where Assessed - Lower Body Bathing: Supported sit to stand Upper Body Dressing: Simulated;Set up Where Assessed - Upper Body Dressing: Unsupported sitting Lower Body Dressing: Simulated;+2 Total assistance Lower Body Dressing: Patient Percentage: 0% Where Assessed - Lower Body Dressing: Supported sit to stand Toileting - Architect and Hygiene: Simulated;+2 Total assistance Toileting - Architect and Hygiene: Patient Percentage: 0% Equipment Used: Rolling walker;Gait belt Transfers/Ambulation Related to ADLs: moved very slowly supporting pt's LLE for minimizing pain.  Pt was able to to do most of bed mobility  and was able to stand briefly then scoot up to [facility].  Will try transfer on next visit as pt did not want to try transfer yet ADL Comments: Pt has had TKA done. Not ready for AE yet but will introduce as he will benefit    OT Diagnosis:    OT Problem List: Decreased strength;Decreased activity tolerance;Pain;Decreased knowledge of use of DME or AE OT Treatment Interventions: Self-care/ADL training;DME and/or AE instruction;Patient/family education;Therapeutic activities   OT Goals Acute Rehab OT Goals OT Goal Formulation: With patient Time For Goal Achievement: 08/21/12 Potential to Achieve Goals: Good ADL Goals Pt Will Transfer to Toilet: with min assist;Stand pivot transfer;3-in-1 ADL Goal: Toilet Transfer - Progress: Goal set today Pt Will Perform Toileting - Hygiene: with min assist;Leaning right and/or left on 3-in-1/toilet;Sitting on 3-in-1 or toilet ADL Goal: Toileting - Hygiene - Progress: Goal set today Miscellaneous OT Goals Miscellaneous OT Goal #1: Pt will maintain static standing with RW with min guard for adls OT Goal: Miscellaneous Goal #1 - Progress: Goal set today  Visit Information  Last OT Received On: 08/07/12 Assistance Needed: +2 PT/OT Co-Evaluation/Treatment: Yes    Subjective Data  Subjective: I don;t know what I'll be able to do.  This thing hurts Patient Stated Goal: probably [facility] befoer he goes home   Prior Functioning     Home Living Lives With: Alone Type of Home: House Additional Comments: will likely need STSNF Prior Function Comments: [place] rock Communication Communication: No difficulties         Vision/Perception     Cognition  Overall Cognitive Status: Appears within functional limits for tasks assessed/performed Arousal/Alertness: Awake/alert Orientation Level: Appears intact for tasks assessed Behavior  During Session: [facility] for tasks performed    Extremity/Trunk Assessment Right Upper  Extremity Assessment RUE ROM/Strength/Tone: [facility] for tasks assessed (sore from fall) Left Upper Extremity Assessment LUE ROM/Strength/Tone: Within functional levels     Mobility Bed Mobility Bed Mobility: Supine to Sit Supine to Sit: 1: +2 Total assist Supine to Sit: Patient Percentage: 70% Transfers Transfers: Sit to Stand Sit to Stand: 1: +2 Total assist Sit to Stand: Patient Percentage: 60%     Shoulder Instructions     Exercise     Balance     End of Session OT - End of Session Activity Tolerance: Patient tolerated treatment well Patient left: in bed;with call bell/phone within reach Nurse Communication: Other (comment)  GO     Shawn [name] 08/07/2012, 11:56 AM Shawn [name], OTR/L [phone] 08/07/2012

## 2012-08-08 DIAGNOSIS — W19XXXA Unspecified fall, initial encounter: Secondary | ICD-10-CM

## 2012-08-08 DIAGNOSIS — K5900 Constipation, unspecified: Secondary | ICD-10-CM | POA: Diagnosis not present

## 2012-08-08 LAB — BASIC METABOLIC PANEL
CO2: 30 mEq/L (ref 19–32)
Calcium: 8 mg/dL — ABNORMAL LOW (ref 8.4–10.5)
Creatinine, Ser: 0.78 mg/dL (ref 0.50–1.35)
Glucose, Bld: 109 mg/dL — ABNORMAL HIGH (ref 70–99)

## 2012-08-08 LAB — CBC
MCH: 31.2 pg (ref 26.0–34.0)
MCV: 93.1 fL (ref 78.0–100.0)
Platelets: 231 10*3/uL (ref 150–400)
RBC: 3.46 MIL/uL — ABNORMAL LOW (ref 4.22–5.81)

## 2012-08-08 LAB — NASAL CULTURE (N/P)

## 2012-08-08 MED ORDER — DIPHENHYDRAMINE HCL 25 MG PO CAPS
25.0000 mg | ORAL_CAPSULE | Freq: Once | ORAL | Status: AC
  Administered 2012-08-08: 25 mg via ORAL

## 2012-08-08 MED ORDER — DIPHENHYDRAMINE HCL 25 MG PO CAPS
ORAL_CAPSULE | ORAL | Status: AC
  Administered 2012-08-08: 25 mg via ORAL
  Filled 2012-08-08: qty 1

## 2012-08-08 MED ORDER — POLYETHYLENE GLYCOL 3350 17 G PO PACK
17.0000 g | PACK | Freq: Every day | ORAL | Status: DC
  Administered 2012-08-08 – 2012-08-10 (×3): 17 g via ORAL

## 2012-08-08 MED ORDER — DIPHENHYDRAMINE HCL 25 MG PO CAPS
25.0000 mg | ORAL_CAPSULE | Freq: Four times a day (QID) | ORAL | Status: DC | PRN
  Administered 2012-08-09 – 2012-08-12 (×4): 25 mg via ORAL
  Filled 2012-08-08 (×4): qty 1

## 2012-08-08 NOTE — Progress Notes (Signed)
[facility] PROGRESS NOTE  Shawn [name] EXB:[id number] DOB: 08/25/1951 DOA: 08/05/2012 PCP: Jeoffrey Massed, MD  Assessment/Plan: Principal Problem:  *Hip fracture, intertrochanteric: Status post repair yesterday. Stable. Hemoglobin at 10.8 Active Problems: Constipation: Secondary to pain medications. Has started bowel regimen.  Traumatic hematoma of buttock: Patient with right gluteal hematoma status post fall. Hemoglobin be monitored after surgery.  ETOH abuse currently on CIWA protocol. Watching for withdrawal.  H/O: substance abuse  Tobacco abuse: Counseled. On nicotine patch.  History of hernia repair: Appreciate surgery consult. Stable.   Code Status: Full Family Communication: [name] to give an update to anyone. Disposition Plan: Short-term skilled nursing facility on Monday   Consultants:  [facility]  General surgery  Procedures:  Status post ORIF yesterday.  Antibiotics:  None  HPI/Subjective: Patient feeling okay. Some soreness. Says that it is relatively well controlled with medication. Has not had a bowel movement in 2 days.  Objective: Filed Vitals:   08/08/12 0400 08/08/12 0500 08/08/12 0739 08/08/12 1151  BP:  126/77    Pulse:  73    Temp:  98.4 F (36.9 C)    TempSrc:  Oral    Resp: 18 16 16 18   Height:      Weight:      SpO2:  97% 96% 96%    Intake/Output Summary (Last 24 hours) at 08/08/12 1210 Last data filed at 08/08/12 1027  Gross per 24 hour  Intake    837 ml  Output   1575 ml  Net   -738 ml   Filed Weights   08/06/12 1948  Weight: 86.637 kg (191 lb)    Exam:   General:  Alert and oriented x3, minimal distress secondary to pain  Cardiovascular: Regular rate and rhythm, S1-S2  Respiratory: Clear to auscultation bilaterally  Abdomen: Soft, nontender, nondistended, hypoactive bowel sounds  Extremities: No clubbing or cyanosis, trace pitting edema, wound site clear, no drainage  Data  Reviewed: Basic Metabolic Panel:  Lab 08/08/12 2536 08/07/12 0415 08/06/12 0103  NA 137 138 148*  K 3.5 3.8 3.7  CL 101 104 110  CO2 30 27 24   GLUCOSE 109* 152* 111*  BUN 8 9 9   CREATININE 0.78 0.70 0.85  CALCIUM 8.0* 7.9* 8.8  MG -- -- --  PHOS -- -- --   CBC:  Lab 08/08/12 0500 08/07/12 0415 08/06/12 1830 08/06/12 0103  WBC 5.0 7.5 7.2 5.3  NEUTROABS -- -- -- 4.3  HGB 10.8* 11.0* 12.9* 13.5  HCT 32.2* 32.6* 38.2* 40.3  MCV 93.1 91.3 92.5 92.0  PLT 231 239 232 254     Recent Results (from the past 240 hour(s))  NASAL CULTURE (N/P)     Status: Normal (Preliminary result)   Collection Time   08/06/12 11:03 AM      Component Value Range Status Comment   Specimen Description NASOPHARYNGEAL   Final    Special Requests NONE   Final    Culture NORMAL NASOPHARYNGEAL FLORA   Final    Report Status PENDING   Incomplete      Studies: Dg Hip Complete Left  08/06/2012   IMPRESSION:  1.  No acute findings. 2.  Mild to moderate hip osteoarthritis.   Original Report Authenticated By: Myles Rosenthal, M.D.    Dg Hip Operative Left  08/06/2012  .  IMPRESSION: Satisfactory appearance of the left hip after open reduction and internal fixation of the intertrochanteric fracture.   Original Report Authenticated By: Francene Boyers, M.D.  Ct Head Wo Contrast  08/06/2012   IMPRESSION:  1.  No evidence of traumatic intracranial injury or fracture. 2.  Mild cortical volume loss and scattered small vessel ischemic microangiopathy. 3.  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 4.  Minimal mucosal thickening within the left maxillary sinus.    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   Original Report Authenticated By: Tonia Ghent, M.D.     Mr Hip Left Wo Contrast  08/06/2012   IMPRESSION:  1.  Acute left intertrochanteric fracture with extensive hematoma formation present about the fracture. 2.  Findings most  consistent with gluteal hematoma on the right. 3.  Mild to moderate bilateral hip degenerative change. 4.  Sigmoid diverticulosis.   Original Report Authenticated By: Holley Dexter, M.D.     Scheduled Meds:    . buPROPion  150 mg Oral BID  . [COMPLETED] diphenhydrAMINE  25 mg Oral Once  . enoxaparin (LOVENOX) injection  40 mg Subcutaneous Q24H  . feeding supplement  237 mL Oral QID  . multivitamin with minerals  1 tablet Oral Daily  . nicotine  21 mg Transdermal Daily  . polyethylene glycol  17 g Oral Daily  . thiamine  100 mg Oral Daily   Continuous Infusions:    . [EXPIRED] dextrose 5 % and 0.45 % NaCl with KCl 20 mEq/L Stopped (08/07/12 0756)    Principal Problem:  *Hip fracture, intertrochanteric Active Problems:  Hip fracture, left  Traumatic hematoma of buttock  ETOH abuse  H/O: substance abuse  Tobacco abuse  History of hernia repair    Time spent: 15 minutes    Hollice Espy  [facility] Pager [phone]. If 8PM-8AM, please contact night-coverage at [email or web address], password [facility] 08/08/2012, 12:10 PM  LOS: 3 days

## 2012-08-08 NOTE — Progress Notes (Signed)
   Subjective: 2 Days Post-Op Procedure(s) (LRB): INTRAMEDULLARY (IM) NAIL FEMORAL (Left) Patient reports pain as mild and moderate.   Patient seen in rounds with Dr. Lequita Halt. Patient is having problems with pain in the hip and thigh, requiring pain medications Plan is to go Skilled nursing facility after hospital stay.  Objective: Vital signs in last 24 hours: Temp:  [98.4 F (36.9 C)-99 F (37.2 C)] 98.4 F (36.9 C) (12/07 0500) Pulse Rate:  [73-92] 73  (12/07 0500) Resp:  [16-22] 16  (12/07 0739) BP: (126-142)/(77-84) 126/77 mmHg (12/07 0500) SpO2:  [96 %-98 %] 96 % (12/07 0739)  Intake/Output from previous day:  Intake/Output Summary (Last 24 hours) at 08/08/12 0750 Last data filed at 08/08/12 0500  Gross per 24 hour  Intake    957 ml  Output   1300 ml  Net   -343 ml    Labs:  Basename 08/08/12 0500 08/07/12 0415 08/06/12 1830 08/06/12 0103  HGB 10.8* 11.0* 12.9* 13.5    Basename 08/08/12 0500 08/07/12 0415  WBC 5.0 7.5  RBC 3.46* 3.57*  HCT 32.2* 32.6*  PLT 231 239    Basename 08/08/12 0500 08/07/12 0415  NA 137 138  K 3.5 3.8  CL 101 104  CO2 30 27  BUN 8 9  CREATININE 0.78 0.70  GLUCOSE 109* 152*  CALCIUM 8.0* 7.9*    Basename 08/06/12 0103  LABPT --  INR 0.92    EXAM General - Patient is Alert, Appropriate and Oriented Extremity - Neurovascular intact Sensation intact distally Dorsiflexion/Plantar flexion intact Dressing/Incision - clean, dry, no drainage, healing Motor Function - intact, moving foot and toes well on exam.   Past Medical History  Diagnosis Date  . Osteoarthritis X years    left knee.  Distant hx (age 54) "dislocated" knee and required surgery, then crush injury to patella required knee cap removal.  . Diverticular disease     "itis" x 2 episodes (last was about 2007)  . Tobacco dependence   . Multiple rib fractures 10/09/10    s/p fall down a ravine--9th and 10th on right, contusion of left 7th and 8th ribs  . Fracture  of metatarsal bone(s), closed     3rd, 4th, 5th mid shaft on left foot  . History of substance abuse     cocaine, marijuana, acid, alcohol (in remission since 2010)  . Solar dermatitis     face and ears  . Bipolar disorder     Has had multiple psych hospitalization in the past, most recently at [facility] 01/31/11-02/04/11.  Says meds made him emotionally blunted. (lamictal and wellbutrin).  . Alcoholism     "Dry" since 2002  . Depression     Assessment/Plan: 2 Days Post-Op Procedure(s) (LRB): INTRAMEDULLARY (IM) NAIL FEMORAL (Left) Principal Problem:  *Hip fracture, intertrochanteric Active Problems:  Hip fracture, left  Traumatic hematoma of buttock  ETOH abuse  H/O: substance abuse  Tobacco abuse  History of hernia repair  Estimated Body mass index is 25.20 kg/(m^2) as calculated from the following:   Height as of this encounter: 6\' 1" (1.854 m).   Weight as of this encounter: 191 lb(86.637 kg). Up with therapy Discharge to SNF when stable and ready  DVT Prophylaxis - Lovenox Weight Bearing As Tolerated left Leg  PERKINS, ALEXZANDREW 08/08/2012, 7:50 AM

## 2012-08-09 LAB — CBC
MCH: 31.7 pg (ref 26.0–34.0)
MCHC: 34.8 g/dL (ref 30.0–36.0)
MCV: 91.1 fL (ref 78.0–100.0)
Platelets: 252 10*3/uL (ref 150–400)
RDW: 14.3 % (ref 11.5–15.5)

## 2012-08-09 MED ORDER — DIPHENHYDRAMINE HCL 25 MG PO CAPS: 25.0000 mg | ORAL_CAPSULE | Freq: Four times a day (QID) | ORAL | Status: DC | PRN

## 2012-08-09 MED ORDER — BISACODYL 5 MG PO TBEC: 5.0000 mg | DELAYED_RELEASE_TABLET | Freq: Every day | ORAL | Status: DC | PRN

## 2012-08-09 MED ORDER — OXYCODONE HCL 5 MG PO TABS: 5.0000 mg | ORAL_TABLET | ORAL | Status: DC | PRN

## 2012-08-09 MED ORDER — POLYETHYLENE GLYCOL 3350 17 G PO PACK: 17.0000 g | PACK | Freq: Every day | ORAL | Status: DC

## 2012-08-09 NOTE — Discharge Summary (Addendum)
Physician Discharge Summary  Shawn [name] UJW:[id number] DOB: 02/13/1951 DOA: 08/05/2012  PCP: Jeoffrey Massed, MD  Admit date: 08/05/2012 Discharge date: 08/10/2012  Time spent: 25 minutes  Recommendations for Outpatient Follow-up:  1. Patient is being discharged for short-term skilled nursing facility for rehabilitation 2. He'll follow up with orthopedic surgery in 2 weeks 3. Followup with primary care physician in one month  Discharge Diagnoses:  Principal Problem:  *Hip fracture, intertrochanteric Active Problems:  Hip fracture, left  Traumatic hematoma of buttock  ETOH abuse  H/O: substance abuse  Tobacco abuse  History of hernia repair  Constipation   Discharge Condition: Improved, being discharged to short-term skilled nursing facility for rehabilitation  Diet recommendation: Regular  Filed Weights   08/06/12 1948  Weight: 86.637 kg (191 lb)    History of present illness:  61 year old white male past we will history of substance abuse including alcohol, drugs and tobacco as well as bipolar disorder who underwent a partial small bowel resection plus right femoral hernia repair less than 2 months ago presented after mechanical fall (while alcohol intoxicated) and presented to the emergency room unable to stand. He was found to have a left-sided intertrochanteric hip fracture with extensive hematoma formation around the area. Hospitals were called for evaluation and admission with orthopedic consultation.  Hospital Course:  Principal Problem:  *Hip fracture, intertrochanteric: Patient was evaluated by orthopedic surgery and underwent IM nailing on 08/07/12. Tolerated procedure well without any complications. Pain was moderately well-controlled with oral medications. He will go to short-term skilled nursing on 12/9. He'll follow up with orthopedic surgery in 2 weeks. Active Problems:   Traumatic hematoma of buttock: Patient's hemoglobin was monitored even after surgery.  His hemoglobin got as low as 10.8 on 12/7 and an increased on its own to 12.1.  ETOH abuse: Stable. Patient had no alcohol withdrawal during his hospitalization.  H/O: substance abuse: Noted. Minimize narcotics.  Tobacco abuse: Patient is counseled. He will continue on nicotine patch.  History of hernia repair general surgery was consulted for followup. He evaluated his midline wound which is clean and healing nicely. They recommended continuing daily moist saline the wound packing  Constipation: Secondary to pain medications. Patient was started on bowel regimen of Colace plus MiraLAX and responded to this.   Procedures:  Status post ORIF done 12/6  Consultations:  [facility]ane-orthopedic surgery  Hoxworth-Gen. surgery  Discharge Exam: Filed Vitals:   08/08/12 1151 08/08/12 1501 08/08/12 2100 08/09/12 0515  BP:  144/87 153/80 126/79  Pulse:  91 98 88  Temp:  98.8 F (37.1 C) 98.5 F (36.9 C) 98.6 F (37 C)  TempSrc:  Oral Oral Oral  Resp: 18 16 20 20   Height:      Weight:      SpO2: 96% 97% 98% 95%    General: Alert and oriented x3, no acute distress Cardiovascular: Regular rate and rhythm, S1-S2 Respiratory: Clear to auscultation bilaterally Abdomen: Soft, nontender, nondistended positive bowel sounds Extremities: No clubbing or cyanosis or edema  Discharge Instructions  Hernia wound: Daily moist saline wound packing.  Discharge Orders    Future Appointments: Provider: Department: Dept Phone: Center:   08/13/2012 12:00 PM Mariella Saa, MD [facility], [place] [phone] None     Future Orders Please Complete By Expires   Diet general      Partial weight bearing      Increase activity slowly      Walk with assistance  Medication List     As of 08/09/2012  2:50 PM    TAKE these medications         bisacodyl 5 MG EC tablet   Commonly known as: DULCOLAX   Take 1 tablet (5 mg total) by mouth daily as needed.      buPROPion 150 MG 12 hr  tablet   Commonly known as: WELLBUTRIN SR   Take 1 tablet (150 mg total) by mouth 2 (two) times daily.      diphenhydrAMINE 25 mg capsule   Commonly known as: BENADRYL   Take 1 capsule (25 mg total) by mouth every 6 (six) hours as needed for itching or sleep.      feeding supplement Liqd   Take 237 mLs by mouth 2 (two) times daily between meals.      LOVENOX 40 MG/0.4ML injection   Generic drug: enoxaparin   Inject 0.4 mLs (40 mg total) into the skin daily.      nicotine 21 mg/24hr patch   Commonly known as: NICODERM CQ - dosed in mg/24 hours   Place 1 patch onto the skin daily.      oxyCODONE 5 MG immediate release tablet   Commonly known as: Oxy IR/ROXICODONE   Take 1-2 tablets (5-10 mg total) by mouth every 4 (four) hours as needed ((for MODERATE breakthrough pain)).      oxyCODONE-acetaminophen 5-325 MG per tablet   Commonly known as: PERCOCET/ROXICET   Take 1 tablet by mouth every 4 (four) hours as needed for pain.      polyethylene glycol packet   Commonly known as: MIRALAX / GLYCOLAX   Take 17 g by mouth daily.           Follow-up Information    Follow up with BEANE,JEFFREY C, MD. In 10 days.   Contact information:   [address] [place] [address] [phone]       Follow up with Jeoffrey Massed, MD. Schedule an appointment as soon as possible for a visit in 1 month.   Contact information:   [employer] [address] [place] [address] [phone]           The results of significant diagnostics from this hospitalization (including imaging, microbiology, ancillary and laboratory) are listed below for reference.    Significant Diagnostic Studies: Dg Hip Complete Left  08/06/2012   IMPRESSION:  1.  No acute findings. 2.  Mild to moderate hip osteoarthritis.   Original Report Authenticated By: Myles Rosenthal, M.D.    Dg Hip Operative Left  08/06/2012  IMPRESSION: Satisfactory appearance of the left hip after open reduction  and internal fixation of the intertrochanteric fracture.   Original Report Authenticated By: Francene Boyers, M.D.      CT HEAD   IMPRESSION:  1.  No evidence of traumatic intracranial injury or fracture. 2.  Mild cortical volume loss and scattered small vessel ischemic microangiopathy. 3.  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 4.  Minimal mucosal thickening within the left maxillary sinus.    CT CERVICAL SPINE IMPRESSION:  1.  No evidence of fracture or subluxation along the cervical spine. 2.  Incidental note of incomplete fusion of the posterior elements of C4.   Original Report Authenticated By: Tonia Ghent, M.D.    Mr Hip Left Wo Contrast  08/06/2012   IMPRESSION:  1.  Acute left intertrochanteric fracture with extensive hematoma formation present about the fracture. 2.  Findings most consistent  with gluteal hematoma on the right. 3.  Mild to moderate bilateral hip degenerative change. 4.  Sigmoid diverticulosis.   Original Report Authenticated By: Holley Dexter, M.D.     Microbiology: Recent Results (from the past 240 hour(s))  NASAL CULTURE (N/P)     Status: Normal   Collection Time   08/06/12 11:03 AM      Component Value Range Status Comment   Specimen Description NASOPHARYNGEAL   Final    Special Requests NONE   Final    Culture NORMAL NASOPHARYNGEAL FLORA   Final    Report Status 08/08/2012 FINAL   Final      Labs: Basic Metabolic Panel:  Lab 08/08/12 4010 08/07/12 0415 08/06/12 0103  NA 137 138 148*  K 3.5 3.8 3.7  CL 101 104 110  CO2 30 27 24   GLUCOSE 109* 152* 111*  BUN 8 9 9   CREATININE 0.78 0.70 0.85  CALCIUM 8.0* 7.9* 8.8  MG -- -- --  PHOS -- -- --   CBC:  Lab 08/09/12 0451 08/08/12 0500 08/07/12 0415 08/06/12 1830 08/06/12 0103  WBC 5.8 5.0 7.5 7.2 5.3  NEUTROABS -- -- -- -- 4.3  HGB 12.1* 10.8* 11.0* 12.9* 13.5  HCT 34.8* 32.2* 32.6* 38.2* 40.3  MCV 91.1 93.1 91.3 92.5 92.0  PLT 252 231 239 232 254      Signed:  Kalla Watson K  [facility] 08/09/2012, 2:50 PM

## 2012-08-09 NOTE — Progress Notes (Signed)
Subjective: 3 Days Post-Op Procedure(s) (LRB): INTRAMEDULLARY (IM) NAIL FEMORAL (Left) Patient reports pain as 4 on 0-10 scale.    Objective: Vital signs in last 24 hours: Temp:  [98.5 F (36.9 C)-98.8 F (37.1 C)] 98.6 F (37 C) (12/08 0515) Pulse Rate:  [88-98] 88  (12/08 0515) Resp:  [16-20] 20  (12/08 0515) BP: (126-153)/(79-87) 126/79 mmHg (12/08 0515) SpO2:  [95 %-98 %] 95 % (12/08 0515)  Intake/Output from previous day: 12/07 0701 - 12/08 0700 In: 900 [P.O.:900] Out: 2405 [Urine:2405] Intake/Output this shift: Total I/O In: 240 [P.O.:240] Out: 400 [Urine:400]   Basename 08/09/12 0451 08/08/12 0500 08/07/12 0415 08/06/12 1830  HGB 12.1* 10.8* 11.0* 12.9*    Basename 08/09/12 0451 08/08/12 0500  WBC 5.8 5.0  RBC 3.82* 3.46*  HCT 34.8* 32.2*  PLT 252 231    Basename 08/08/12 0500 08/07/12 0415  NA 137 138  K 3.5 3.8  CL 101 104  CO2 30 27  BUN 8 9  CREATININE 0.78 0.70  GLUCOSE 109* 152*  CALCIUM 8.0* 7.9*   No results found for this basename: LABPT:2,INR:2 in the last 72 hours  Dorsiflexion/Plantar flexion intact  Assessment/Plan: 3 Days Post-Op Procedure(s) (LRB): INTRAMEDULLARY (IM) NAIL FEMORAL (Left) Discharge to SNF Awaiting SNF.  Shawn [name] A 08/09/2012, 8:44 AM

## 2012-08-10 NOTE — Progress Notes (Addendum)
CSW met with patient and gave bed offers. There is no male bed available at [facility]. Patient is agreeable to [facility]. Notified [facility] to obtain auth from [facility].  Archer Moist C. Toriana Sponsel MSW, LCSW [phone] At 4:20PM, Berkley Harvey has still not been obtained. CSW spoke with [facility]. If Berkley Harvey is obtained today after CSW leaves, they will call the floor to set up transport. Packet is copied and in [place].  Adamary Savary C. Kwinton Maahs MSW, LCSW [phone]

## 2012-08-10 NOTE — Progress Notes (Signed)
Physical Therapy Treatment Patient Details Name: Shawn [name] MRN: [id number] DOB: 04/18/1951 Today's Date: 08/10/2012 Time: 1478-2956 PT Time Calculation (min): 35 min  PT Assessment / Plan / Recommendation Comments on Treatment Session  L IM Nail POD # 4 s/p fall/fx.  Pt lives home alone and plans to D/C to SNF for ST Rehab.    Follow Up Recommendations  SNF     Does the patient have the potential to tolerate intense rehabilitation     Barriers to Discharge        Equipment Recommendations  None recommended by PT    Recommendations for Other Services    Frequency Min 3X/week   Plan Discharge plan remains appropriate    Precautions / Restrictions Precautions Precautions: Fall Restrictions Weight Bearing Restrictions: Yes LLE Weight Bearing: Partial weight bearing   Pertinent Vitals/Pain C/o 8/10 L hip pain ICE applied Pain meds requested    Mobility  Bed Mobility Bed Mobility: Supine to Sit Supine to Sit: 2: Max assist Details for Bed Mobility Assistance: Increased time and MAX encouragement to participate. Transfers Transfers: Sit to Stand;Stand to Sit Sit to Stand: 1: +2 Total assist Sit to Stand: Patient Percentage: 60% Stand to Sit: 1: +2 Total assist Stand to Sit: Patient Percentage: 60% Details for Transfer Assistance: 75% VC's on proper tech and hand placement.  Used EVA walker for increased support. Pt requires MAX positive reinforcement. Ambulation/Gait Ambulation/Gait Assistance: 1: +2 Total assist Ambulation Distance (Feet): 4 Feet Assistive device: Eva walker Ambulation/Gait Assistance Details: Used EVA walker for increased support and + 2 assist with MAX encouragement to increase amb distance and decrease anxiety. Gait Pattern: Step-to pattern;Decreased stance time - left Gait velocity: decreased     PT Goals    Visit Information  Last PT Received On: 08/10/12 Assistance Needed: +2    Subjective Data  Subjective: I can't walk Patient  Stated Goal: Rehab   Cognition       Balance     End of Session PT - End of Session Equipment Utilized During Treatment: Gait belt Activity Tolerance: Patient limited by pain Patient left: in chair;with call bell/phone within reach   Felecia Shelling  PTA [facility]  Acute  Rehab Pager     [phone]

## 2012-08-10 NOTE — Progress Notes (Signed)
Subjective: 4 Days Post-Op Procedure(s) (LRB): INTRAMEDULLARY (IM) NAIL FEMORAL (Left) Patient reports pain as moderate.    Objective: Vital signs in last 24 hours: Temp:  [97.9 F (36.6 C)-100 F (37.8 C)] 98.4 F (36.9 C) (12/09 0500) Pulse Rate:  [85-95] 85  (12/09 0500) Resp:  [16-20] 16  (12/09 0500) BP: (115-133)/(76-82) 115/76 mmHg (12/09 0500) SpO2:  [95 %-96 %] 95 % (12/09 0500)  Intake/Output from previous day: 12/08 0701 - 12/09 0700 In: 840 [P.O.:840] Out: 2820 [Urine:2820] Intake/Output this shift:     Basename 08/09/12 0451 08/08/12 0500  HGB 12.1* 10.8*    Basename 08/09/12 0451 08/08/12 0500  WBC 5.8 5.0  RBC 3.82* 3.46*  HCT 34.8* 32.2*  PLT 252 231    Basename 08/08/12 0500  NA 137  K 3.5  CL 101  CO2 30  BUN 8  CREATININE 0.78  GLUCOSE 109*  CALCIUM 8.0*   No results found for this basename: LABPT:2,INR:2 in the last 72 hours  Neurologically intact Neurovascular intact Sensation intact distally Intact pulses distally Incision: dressing C/D/I  Assessment/Plan: 4 Days Post-Op Procedure(s) (LRB): INTRAMEDULLARY (IM) NAIL FEMORAL (Left) Up with therapy Discharge to SNF when stable  Lovenox for DVT ppx  Shawn Catalina M. 08/10/2012, 8:38 AM

## 2012-08-10 NOTE — Progress Notes (Signed)
For skilled nursing facility today. Stable. Hemoglobin up to 12.

## 2012-08-11 NOTE — Progress Notes (Signed)
Patient is set to discharge to [facility] SNF tomorrow (Wed, 08/12/12) due to insurance authorization. Patient will discharge to SNF tomorrow.   Clinical Social Work Department CLINICAL SOCIAL WORK PLACEMENT NOTE 08/11/2012  Patient:  Shawn [name], Shawn [name]  Account Number:  [id number] Admit date:  08/05/2012  Clinical Social Worker:  Cori Razor, LCSW  Date/time:  08/07/2012 03:12 PM  Clinical Social Work is seeking post-discharge placement for this patient at the following level of care:   SKILLED NURSING   (*CSW will update this form in Epic as items are completed)   08/07/2012  Patient/family provided with [facility] Department of Clinical Social Work's list of facilities offering this level of care within the geographic area requested by the patient (or if unable, by the patient's family).  08/07/2012  Patient/family informed of their freedom to choose among providers that offer the needed level of care, that participate in Medicare, Medicaid or managed care program needed by the patient, have an available bed and are willing to accept the patient.    Patient/family informed of [facility]' ownership interest in [facility], as well as of the fact that they are under no obligation to receive care at this facility.  PASARR submitted to EDS on 08/07/2012 PASARR number received from EDS on   FL2 transmitted to all facilities in geographic area requested by pt/family on  08/07/2012 FL2 transmitted to all facilities within larger geographic area on   Patient informed that his/her managed care company has contracts with or will negotiate with  certain facilities, including the following:     Patient/family informed of bed offers received:  08/11/2012 Patient chooses bed at [facility] [place] Physician recommends and patient chooses bed at    Patient to be transferred to [facility] on  08/11/2012 Patient to be  transferred to facility by [facility]  The following physician request were entered in Epic:   Additional Comments:  Unice Bailey, LCSW [facility] Clinical Social Worker cell #: [phone]

## 2012-08-11 NOTE — Progress Notes (Signed)
Patient stable. Vital signs stable. Was unable to go to skilled nursing facility yesterday because of insurance issues. No new active issues. For discharge to skilled nursing facility when bed available.

## 2012-08-11 NOTE — Progress Notes (Signed)
Physical Therapy Treatment Patient Details Name: Shawn [name] MRN: [id number] DOB: March 03, 1951 Today's Date: 08/11/2012 Time: 4098-1191 PT Time Calculation (min): 24 min  PT Assessment / Plan / Recommendation Comments on Treatment Session  POD # 5 L IM nail 2nd femur fx due to a fall.  Pt progressing slowly, lives alone and will need ST SNF for Rehab to regain prior level of function.    Follow Up Recommendations  SNF     Does the patient have the potential to tolerate intense rehabilitation     Barriers to Discharge        Equipment Recommendations  None recommended by PT    Recommendations for Other Services    Frequency Min 3X/week   Plan Discharge plan remains appropriate    Precautions / Restrictions Precautions Precautions: Fall Restrictions Weight Bearing Restrictions: Yes LLE Weight Bearing: Partial weight bearing   Pertinent Vitals/Pain C/o "tightness" ICE applied    Mobility  Bed Mobility Bed Mobility: Supine to Sit;Sitting - Scoot to Edge of Bed Supine to Sit: 3: Mod assist Sitting - Scoot to Edge of Bed: 4: Min assist Details for Bed Mobility Assistance: Increased time and 25% VC's on proper tech Transfers Transfers: Sit to Stand;Stand to Sit Sit to Stand: 3: Mod assist;From bed Sit to Stand: Patient Percentage: 70% Stand to Sit: 3: Mod assist;To chair/3-in-1 Details for Transfer Assistance: Pt prefers to pull himself up using the RW and gets upset if corrected, so I steady the walker for him and let him do so. Ambulation/Gait Ambulation/Gait Assistance: 1: +2 Total assist Ambulation/Gait: Patient Percentage: 70% Ambulation Distance (Feet): 15 Feet Assistive device: Eva walker Ambulation/Gait Assistance Details: Used an Scientist, research (medical) for increased support and safety as pt demon anxiety about walking and can easily be aggitated.  Gait Pattern: Step-to pattern;Decreased stride length;Trunk flexed Gait velocity: decreased    Exercises Total Joint  Exercises Ankle Circles/Pumps: AROM;Both;10 reps;Seated Quad Sets: AROM;Both;10 reps;Seated Gluteal Sets: AROM;Both;10 reps;Seated Heel Slides: AAROM;Left;10 reps;Supine Hip ABduction/ADduction: AAROM;Left;10 reps;Supine Straight Leg Raises: AAROM;Left;10 reps;Supine    PT Goals                                                         Progressing slowly    Visit Information  Last PT Received On: 08/11/12 Assistance Needed: +2 (+ 2 for amb safety)    Subjective Data  Subjective: I walk better if I have my shoes on Patient Stated Goal: Rehab   Cognition  Overall Cognitive Status: Appears within functional limits for tasks assessed/performed Arousal/Alertness: Awake/alert Orientation Level: Appears intact for tasks assessed Behavior During Session: [facility] for tasks performed    Balance   poor  End of Session PT - End of Session Equipment Utilized During Treatment: Gait belt Activity Tolerance: Patient limited by fatigue;Patient limited by pain Patient left: in chair;with call bell/phone within reach (ICE to L hip)   Felecia Shelling  PTA [facility]  Acute  Rehab Pager     [phone]

## 2012-08-11 NOTE — Progress Notes (Signed)
Subjective: 5 Days Post-Op Procedure(s) (LRB): INTRAMEDULLARY (IM) NAIL FEMORAL (Left) Patient reports pain as moderate.    Objective: Vital signs in last 24 hours: Temp:  [98.4 F (36.9 C)-99 F (37.2 C)] 98.9 F (37.2 C) (12/10 0545) Pulse Rate:  [83-92] 92  (12/10 0545) Resp:  [16-18] 16  (12/10 0545) BP: (100-129)/(65-81) 129/81 mmHg (12/10 0545) SpO2:  [93 %-98 %] 93 % (12/10 0545)  Intake/Output from previous day: 12/09 0701 - 12/10 0700 In: 720 [P.O.:720] Out: 2225 [Urine:2225] Intake/Output this shift:     Basename 08/09/12 0451  HGB 12.1*    Basename 08/09/12 0451  WBC 5.8  RBC 3.82*  HCT 34.8*  PLT 252   No results found for this basename: NA:2,K:2,CL:2,CO2:2,BUN:2,CREATININE:2,GLUCOSE:2,CALCIUM:2 in the last 72 hours No results found for this basename: LABPT:2,INR:2 in the last 72 hours  Neurologically intact Neurovascular intact Sensation intact distally Intact pulses distally Dorsiflexion/Plantar flexion intact Incision: dressing C/D/I and no drainage  Assessment/Plan: 5 Days Post-Op Procedure(s) (LRB): INTRAMEDULLARY (IM) NAIL FEMORAL (Left) Discharge to SNF Lovenox for DVT ppx  Akaysha Cobern M. 08/11/2012, 8:30 AM

## 2012-08-11 NOTE — Progress Notes (Signed)
Occupational Therapy Treatment Patient Details Name: Shawn [name] MRN: [id number] DOB: 09-Aug-1951 Today's Date: 08/11/2012 Time: 7829-5621 OT Time Calculation (min): 23 min  OT Assessment / Plan / Recommendation Comments on Treatment Session Pt is making good gains with mobility. Premedicated prior to tx    Follow Up Recommendations  SNF    Barriers to Discharge       Equipment Recommendations  3 in 1 bedside comode    Recommendations for Other Services    Frequency Min 2X/week   Plan      Precautions / Restrictions Precautions Precautions: Fall Restrictions LLE Weight Bearing: Partial weight bearing   Pertinent Vitals/Pain 8/10 premedicated, repositioned and ice applied    ADL  Toilet Transfer: Performed;Minimal assistance Toilet Transfer Method: Ambulance person: Bedside commode Toileting - Clothing Manipulation and Hygiene: Performed;Min guard (hygiene) Where Assessed - Engineer, mining and Hygiene: Lean right and/or left Transfers/Ambulation Related to ADLs: Pt ambulated 3 feet from 3:1 to recliner, with A x 2 for safety, pt 70% ADL Comments: Pt had shoes on which he feels helps with transfers    OT Diagnosis:    OT Problem List:   OT Treatment Interventions:     OT Goals Acute Rehab OT Goals Time For Goal Achievement: 08/21/12 ADL Goals Pt Will Transfer to Toilet: with min assist;Stand pivot transfer;3-in-1 ADL Goal: Toilet Transfer - Progress: Progressing toward goals Pt Will Perform Toileting - Hygiene: with min assist;Leaning right and/or left on 3-in-1/toilet;Sitting on 3-in-1 or toilet ADL Goal: Toileting - Hygiene - Progress: Met  Visit Information  Last OT Received On: 08/11/12 Assistance Needed: +2    Subjective Data      Prior Functioning       Cognition  Overall Cognitive Status: Appears within functional limits for tasks assessed/performed Arousal/Alertness: Awake/alert Orientation Level: Appears  intact for tasks assessed Behavior During Session: [place] for tasks performed    Mobility  Shoulder Instructions Bed Mobility Bed Mobility: Supine to Sit Supine to Sit: 3: Mod assist Transfers Sit to Stand: 1: +2 Total assist Sit to Stand: Patient Percentage: 70% Details for Transfer Assistance: mod vcs for safety, feeling surface behind him and hand placement       Exercises      Balance     End of Session OT - End of Session Activity Tolerance: Patient tolerated treatment well Patient left: in bed;with call bell/phone within reach  GO     Farren Nelles 08/11/2012, 11:19 AM Marica Otter, OTR/L [phone] 08/11/2012

## 2012-08-12 NOTE — Progress Notes (Signed)
Discharge summary sent to payer through [facility]  

## 2012-08-12 NOTE — Care Management Note (Signed)
    Page 1 of 2   08/12/2012     7:00:19 PM   CARE MANAGEMENT NOTE 08/12/2012  Patient:  Shawn [name], Shawn [name]   Account Number:  [id number]  Date Initiated:  08/10/2012  Documentation initiated by:  Colleen Can  Subjective/Objective Assessment:   DX RT HIP FRACTURE: IM NAIL     Action/Plan:   PLANS ARE FOR SNF REHAB   Anticipated DC Date:  08/10/2012   Anticipated DC Plan:  SKILLED NURSING FACILITY  In-house referral  Clinical Social Worker      DC Planning Services  NA      [facility] Choice  NA   Choice offered to / List presented to:  NA   DME arranged  NA      DME agency  NA     HH arranged  NA      HH agency  NA   Status of service:  Completed, signed off Medicare Important Message given?   (If response is "NO", the following Medicare IM given date fields will be blank) Date Medicare IM given:   Date Additional Medicare IM given:    Discharge Disposition:    Per UR Regulation:    If discussed at Long Length of Stay Meetings, dates discussed:    Comments:

## 2012-08-12 NOTE — Progress Notes (Signed)
CSW assisting with d/c planning. Pt is unable to be d/c to [facility] since SNF is out of network and will not cover cost of SNF. Pt has chosen [facility] and [facility] has provided authorization for placement. [facility] has been contacted for trans. to SNF.  Cori Razor LCSW [phone]

## 2012-08-12 NOTE — Progress Notes (Signed)
Subjective: 6 Days Post-Op Procedure(s) (LRB): INTRAMEDULLARY (IM) NAIL FEMORAL (Left) Patient reports pain as 6 on 0-10 scale.    Objective: Vital signs in last 24 hours: Temp:  [97.3 F (36.3 C)-99.5 F (37.5 C)] 98.7 F (37.1 C) (12/11 0609) Pulse Rate:  [85-90] 85  (12/11 0609) Resp:  [16] 16  (12/11 0609) BP: (117-121)/(74-82) 121/82 mmHg (12/11 0609) SpO2:  [94 %-96 %] 94 % (12/11 0609)  Intake/Output from previous day: 12/10 0701 - 12/11 0700 In: 1680 [P.O.:1680] Out: 2175 [Urine:2175] Intake/Output this shift:    No results found for this basename: HGB:5 in the last 72 hours No results found for this basename: WBC:2,RBC:2,HCT:2,PLT:2 in the last 72 hours No results found for this basename: NA:2,K:2,CL:2,CO2:2,BUN:2,CREATININE:2,GLUCOSE:2,CALCIUM:2 in the last 72 hours No results found for this basename: LABPT:2,INR:2 in the last 72 hours  Neurologically intact Neurovascular intact Sensation intact distally Intact pulses distally Dorsiflexion/Plantar flexion intact Incision: dressing C/D/I and no drainage No cellulitis present  Assessment/Plan: 6 Days Post-Op Procedure(s) (LRB): INTRAMEDULLARY (IM) NAIL FEMORAL (Left) Discharge to SNF today - [facility]. Had been delayed due to insurance issue.  Lovenox for DVT ppx Remain PWB LLE Follow up with Dr. Shelle Iron 10-14 days post-op  BISSELL, JACLYN M. 08/12/2012, 8:21 AM

## 2012-08-12 NOTE — Progress Notes (Signed)
[facility] PROGRESS NOTE  Shawn [name] ZOX:[id number] DOB: 10-04-1950 DOA: 08/05/2012 PCP: Jeoffrey Massed, MD  Assessment/Plan: 1. Left Hip fracture--presented following a mechanical fall, and was found on imaging studies, to have an acute left intertrochanteric fracture, with extensive hematoma formation. S/p repair 12/6. 2. Traumatic hematoma of buttock--Hbg stable. 3. Alcohol intoxication--resolved. 4. Alcohol abuse--counseled. 5. S/p incarcerated right femoral hernia repair/small bowel resection 06/15/12--Continue daily moist saline wound packing per general surgery  Follow up with Dr. Shelle Iron 10-14 days post-op Lovenox for DVT ppx, duration per orthopedics Remain PWB LLE per orthopedics  Stable for discharge today.  Family Communication: none present Disposition Plan: to SNF 12/11  Brendia Sacks, MD  [facility] Team 6 Pager [phone] If 8PM-8AM, please contact night-coverage at [email or web address], password [facility] 08/12/2012, 12:28 PM  LOS: 7 days   Brief narrative: 61 year old man, according to patient, he had been out to dinner on 08/05/12, had some alcohol and was walking home on a path, when he stepped on a rock, tripped and he fell injuring his left hip. Underwent surgical repair. Hospitalization was uncomplicated.  Consultants:  Orthopedics  General surgery  OT--SNF  PT--SNF  Procedures:  12/6 Intramedullary nailing of left proximal femur.  HPI/Subjective: Doing ok, no new issues.  Objective: Filed Vitals:   08/11/12 0545 08/11/12 1424 08/11/12 2150 08/12/12 0609  BP: 129/81 119/75 117/74 121/82  Pulse: 92 85 90 85  Temp: 98.9 F (37.2 C) 97.3 F (36.3 C) 99.5 F (37.5 C) 98.7 F (37.1 C)  TempSrc: Oral Oral    Resp: 16 16 16 16   Height:      Weight:      SpO2: 93% 96% 95% 94%    Intake/Output Summary (Last 24 hours) at 08/12/12 1228 Last data filed at 08/12/12 1100  Gross per 24 hour  Intake   1560 ml  Output   3125 ml  Net   -1565 ml   Filed Weights   08/06/12 1950  Weight: 86.637 kg (191 lb)    Exam:  General:  Appears calm and comfortable Cardiovascular: RRR, no m/r/g. No LE edema. Respiratory: CTA bilaterally, no w/r/r. Normal respiratory effort. Psychiatric: grossly normal mood and affect, speech fluent and appropriate  Data Reviewed: Basic Metabolic Panel:  Lab 08/08/12 8295 08/07/12 0415 08/06/12 0103  NA 137 138 148*  K 3.5 3.8 3.7  CL 101 104 110  CO2 30 27 24   GLUCOSE 109* 152* 111*  BUN 8 9 9   CREATININE 0.78 0.70 0.85  CALCIUM 8.0* 7.9* 8.8  MG -- -- --  PHOS -- -- --   CBC:  Lab 08/09/12 0451 08/08/12 0500 08/07/12 0415 08/06/12 1830 08/06/12 0103  WBC 5.8 5.0 7.5 7.2 5.3  NEUTROABS -- -- -- -- 4.3  HGB 12.1* 10.8* 11.0* 12.9* 13.5  HCT 34.8* 32.2* 32.6* 38.2* 40.3  MCV 91.1 93.1 91.3 92.5 92.0  PLT 252 231 239 232 254   Recent Results (from the past 240 hour(s))  NASAL CULTURE (N/P)     Status: Normal   Collection Time   08/06/12 11:03 AM      Component Value Range Status Comment   Specimen Description NASOPHARYNGEAL   Final    Special Requests NONE   Final    Culture NORMAL NASOPHARYNGEAL FLORA   Final    Report Status 08/08/2012 FINAL   Final      Studies: No results found.  Scheduled Meds:   . buPROPion  150 mg Oral BID  . enoxaparin (LOVENOX)  injection  40 mg Subcutaneous Q24H  . feeding supplement  237 mL Oral QID  . multivitamin with minerals  1 tablet Oral Daily  . nicotine  21 mg Transdermal Daily  . polyethylene glycol  17 g Oral Daily  . thiamine  100 mg Oral Daily   Continuous Infusions:   Principal Problem:  *Hip fracture, intertrochanteric Active Problems:  Hip fracture, left  Traumatic hematoma of buttock  ETOH abuse  H/O: substance abuse  Tobacco abuse  History of hernia repair  Constipation     Brendia Sacks, MD  [facility] Team 6 Pager [phone] If 8PM-8AM, please contact night-coverage at [email or web address], password  [facility] 08/12/2012, 12:28 PM  LOS: 7 days   Time spent: 25 minutes

## 2012-08-12 NOTE — Discharge Summary (Signed)
Physician Discharge Summary  Shawn [name] YNW:[id number] DOB: 1951/05/22 DOA: 08/05/2012  PCP: Jeoffrey Massed, MD Orthopedics: Dr. Shelle Iron  Admit date: 08/05/2012 Discharge date: 08/12/2012  SEE MEDICATIONS BELOW FOR UPDATED MEDICATION LIST. THIS LIST SUPERCEDES 12/8 DISCHARGE SUMMARY.   Follow-up Information    Follow up with BEANE,JEFFREY C, MD. In 10 days.   Contact information:   [address] [place] [address] [phone]       Follow up with Jeoffrey Massed, MD. Schedule an appointment as soon as possible for a visit in 1 month.   Contact information:   [employer] [address] [place] [address] [phone]       Follow up with Mariella Saa, MD. In 1 day. (keep follow-up appointment 12/12)    Contact information:   [address] [place] [address] [phone]         Hospital Course:  See Dr. Chancy Milroy discharge summary from 12/8. This is an addendum. Course remained uncomplicated. Discharge was delayed by need for insurance authorization. Medications updated as below.  Discharge Instructions  Discharge Orders    Future Appointments: Provider: Department: Dept Phone: Center:   08/13/2012 12:00 PM Mariella Saa, MD [facility], [place] [phone] None     Future Orders Please Complete By Expires   Diet general      Partial weight bearing      Increase activity slowly      Walk with assistance      Change dressing (specify)      Comments:   Dressing change: Daily moist saline wound packing at incisional site on abdomen       Medication List     As of 08/12/2012  1:02 PM    TAKE these medications         bisacodyl 5 MG EC tablet   Commonly known as: DULCOLAX   Take 1 tablet (5 mg total) by mouth daily as needed.      buPROPion 150 MG 12 hr tablet   Commonly known as: WELLBUTRIN SR   Take 1 tablet (150 mg total) by mouth 2 (two) times daily.      diphenhydrAMINE 25 mg capsule    Commonly known as: BENADRYL   Take 1 capsule (25 mg total) by mouth every 6 (six) hours as needed for itching or sleep.      feeding supplement Liqd   Take 237 mLs by mouth 2 (two) times daily between meals.      LOVENOX 40 MG/0.4ML injection   Generic drug: enoxaparin   Inject 0.4 mLs (40 mg total) into the skin daily.      nicotine 21 mg/24hr patch   Commonly known as: NICODERM CQ - dosed in mg/24 hours   Place 1 patch onto the skin daily.      oxyCODONE-acetaminophen 5-325 MG per tablet   Commonly known as: PERCOCET/ROXICET   Take 1 tablet by mouth every 4 (four) hours as needed for pain.      polyethylene glycol packet   Commonly known as: MIRALAX / GLYCOLAX   Take 17 g by mouth daily.         Time coordinating discharge: 25 minutes  Signed:  Brendia Sacks, MD [facility] 08/12/2012, 1:02 PM

## 2012-08-13 ENCOUNTER — Ambulatory Visit (INDEPENDENT_AMBULATORY_CARE_PROVIDER_SITE_OTHER): Payer: PRIVATE HEALTH INSURANCE | Admitting: General Surgery

## 2012-08-13 ENCOUNTER — Encounter (INDEPENDENT_AMBULATORY_CARE_PROVIDER_SITE_OTHER): Payer: Self-pay | Admitting: General Surgery

## 2012-08-13 VITALS — BP 136/78 | HR 78 | Temp 98.0°F | Resp 18

## 2012-08-13 DIAGNOSIS — Z9889 Other specified postprocedural states: Secondary | ICD-10-CM

## 2012-08-13 DIAGNOSIS — Z48812 Encounter for surgical aftercare following surgery on the circulatory system: Secondary | ICD-10-CM

## 2012-08-13 NOTE — Progress Notes (Signed)
History: Patient returns for further followup of his abdominal wound status post small bowel resection with wound infection. He unfortunately fell a couple of weeks ago at home and has had a recent ORIF of his hip fracture it was actually just discharged to a skilled facility yesterday. I did check his wound while in the hospital and was doing well. He reports no new problems.  Exam: His midline wound is packed with clean moist saline gauze and is all healthy granulation tissue without undermining or other concerns.  Assessment and plan: Open midline wound healing well. Continue current wound care. I will see him back in 6 weeks.

## 2012-10-01 ENCOUNTER — Ambulatory Visit (INDEPENDENT_AMBULATORY_CARE_PROVIDER_SITE_OTHER): Payer: PRIVATE HEALTH INSURANCE | Admitting: General Surgery

## 2012-10-01 ENCOUNTER — Encounter (INDEPENDENT_AMBULATORY_CARE_PROVIDER_SITE_OTHER): Payer: Self-pay | Admitting: General Surgery

## 2012-10-01 VITALS — BP 138/84 | HR 113 | Temp 96.6°F | Resp 18 | Ht 73.5 in | Wt 170.2 lb

## 2012-10-01 DIAGNOSIS — Z09 Encounter for follow-up examination after completed treatment for conditions other than malignant neoplasm: Secondary | ICD-10-CM

## 2012-10-01 NOTE — Patient Instructions (Addendum)
Clean wound in the shower or tub daily and cover with a clean bandage

## 2012-10-01 NOTE — Progress Notes (Signed)
History: Patient returns for more long-term followup history of laparotomy and small bowel resection for incarcerated inguinal hernia. He had a wound infection as has chronic wound care of his midline wound. He recently got out of rehabilitation following a fall and broken hip. He overall is getting stronger and gaining weight. No GI complaints. He thinks the wound is better.  Exam:BP 138/84  Pulse 113  Temp 96.6 F (35.9 C) (Temporal)  Resp 18  Ht 6' 1.5" (1.867 m)  Wt 170 lb 3.2 oz (77.202 kg)  BMI 22.15 kg/m2 General: Overall he looks much stronger healthier Abdomen: Soft and nontender. There is now just a proximally 1 cm x 5 mm x 5 mm cleaned opened wound.  Assessment and plan: Continue to improve. Continue current wound care they'll see him back in one month with likely will be a final check.

## 2012-11-13 ENCOUNTER — Encounter (INDEPENDENT_AMBULATORY_CARE_PROVIDER_SITE_OTHER): Payer: Self-pay | Admitting: General Surgery

## 2012-11-13 ENCOUNTER — Ambulatory Visit (INDEPENDENT_AMBULATORY_CARE_PROVIDER_SITE_OTHER): Payer: PRIVATE HEALTH INSURANCE | Admitting: General Surgery

## 2012-11-13 VITALS — BP 158/90 | HR 78 | Temp 98.2°F | Ht 73.0 in | Wt 166.5 lb

## 2012-11-13 DIAGNOSIS — Z09 Encounter for follow-up examination after completed treatment for conditions other than malignant neoplasm: Secondary | ICD-10-CM

## 2012-11-13 NOTE — Progress Notes (Signed)
Patient returns for more long-term followup status post emergency repair of incarcerated right inguinal hernia with laparotomy and small bowel obstruction for perforation. He had a postoperative wound infection. He then subsequently fell and suffered a broken hip which required ORIF. He is now making a good recovery. He's been released by the orthopedic surgeon. He is gaining weight and beginning to exercise and feeling stronger.  Exam: BP 158/90  Pulse 78  Temp(Src) 98.2 F (36.8 C) (Temporal)  Ht 6\' 1"  (1.854 m)  Wt 166 lb 8 oz (75.524 kg)  BMI 21.97 kg/m2 General: Appears overall much stronger and well Abdomen: Wounds are now completely healed. With him standing. As a moderate diffuse bulge in the lower abdomen consistent with a somewhat smooth diffuse incisional hernia. It is nontender.  Assessment and plan: Doing well with healed wound and no GI complaints. He probably is developing a low midline incisional hernia. We discussed this. Is asymptomatic cervical 1 to do anything about this at present. I would like to see him back in 6 months for more long-term assessment.

## 2012-11-24 ENCOUNTER — Telehealth: Payer: Self-pay

## 2012-11-24 NOTE — Telephone Encounter (Signed)
Patient is requesting a copy of his unpaid bills so that he can submit them to his new insurance carrier.  I will forward this to patient accounting.

## 2012-11-24 NOTE — Telephone Encounter (Signed)
Patient is calling wanting to know if there are any charges that he needs to pay. AMT, CMA

## 2012-11-25 ENCOUNTER — Telehealth: Payer: Self-pay | Admitting: *Deleted

## 2012-11-25 MED ORDER — TADALAFIL 5 MG PO TABS: 5.0000 mg | ORAL_TABLET | Freq: Every day | ORAL | Status: DC | PRN

## 2012-11-25 NOTE — Telephone Encounter (Signed)
I did a rx for #30 with 2 additional RF's.  Needs to have routine office f/u (CPE for 30 minute appt spot if possible) prior to any FURTHER RF's.-thx

## 2012-11-25 NOTE — Telephone Encounter (Signed)
Cialis 5 mg Sig: Take one tablet PO QD requested; EMR shows last Rx given 01.17.2013 with a d/c date 03.22.2013 at hospital d/c encounter/SLS Please advise.

## 2012-11-26 NOTE — Telephone Encounter (Signed)
Patient informed; will schedule CPE appointment soon [recovering form gangrene post hernia Sx & hip Sx post fall]/SLS

## 2013-01-16 IMAGING — CT CT CERVICAL SPINE W/O CM
2 of 3 series · 8 of 14 positions shown, 10 images · non-contrast
Comparison: None

CT HEAD

CLINICAL DATA: Status post fall; unknown whether the patient lost
consciousness.  Concern for head or cervical spine injury.
Abrasion to the lateral right side of the head.

CT HEAD WITHOUT CONTRAST AND CT CERVICAL SPINE WITHOUT CONTRAST
TECHNIQUE: Multidetector CT imaging of the head and cervical spine
was performed following the standard protocol without intravenous
contrast.  Multiplanar CT image reconstructions of the cervical
spine were also generated.

[Series 4: bone windows · axial · 0.45mm/px · z∈[+1349,+1430]mm · 3 of 55 slices shown]
[im 14/55  bone]
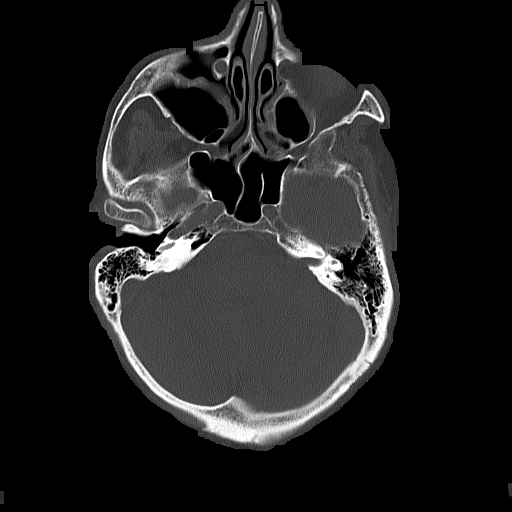
[im 28/55  bone]
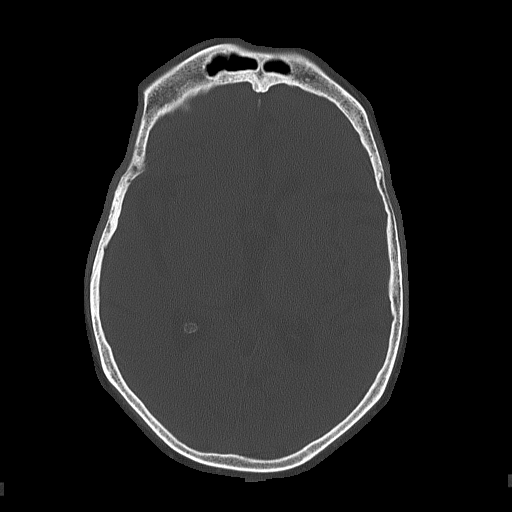
[im 41/55  bone]
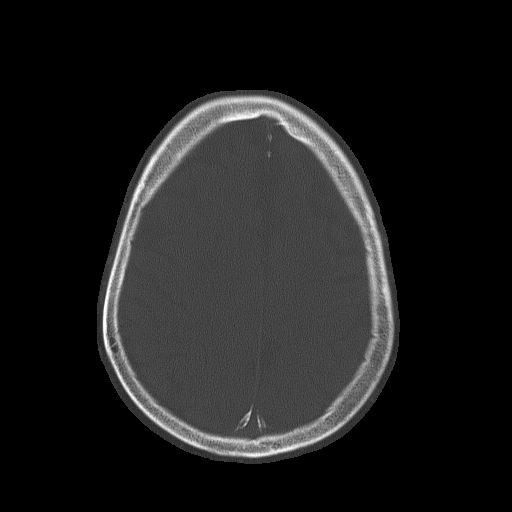

[Series 5: c-spine st · axial · 0.23mm/px · z∈[+1180,+1288]mm · 5 of 82 slices shown, 7 images]
[im 14/82  soft-tissue]
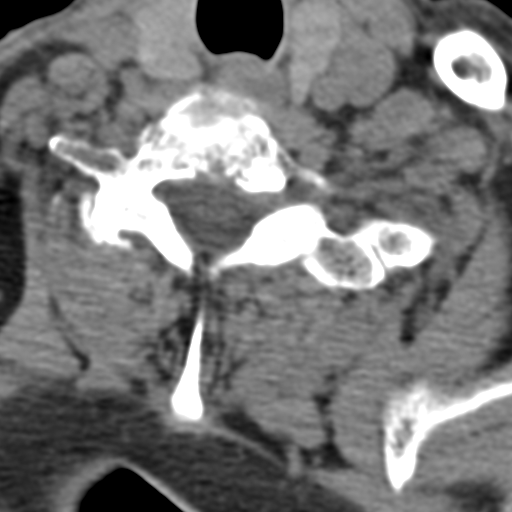
[im 14/82  bone]
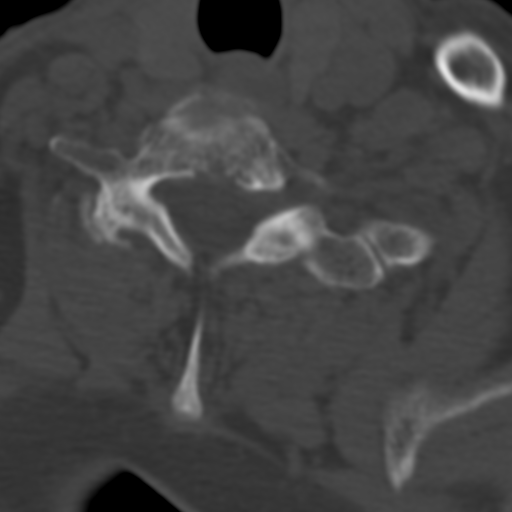
[im 28/82  bone]
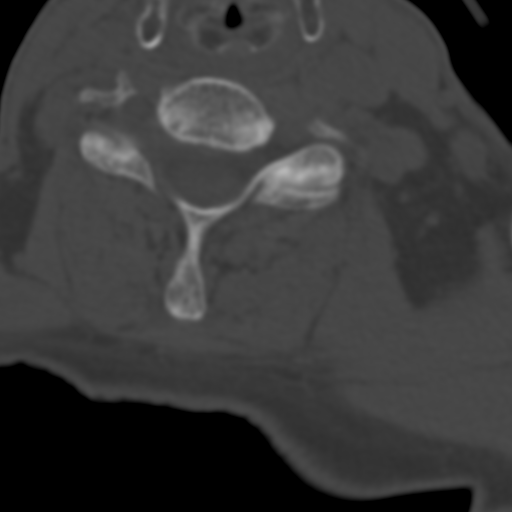
[im 41/82  bone]
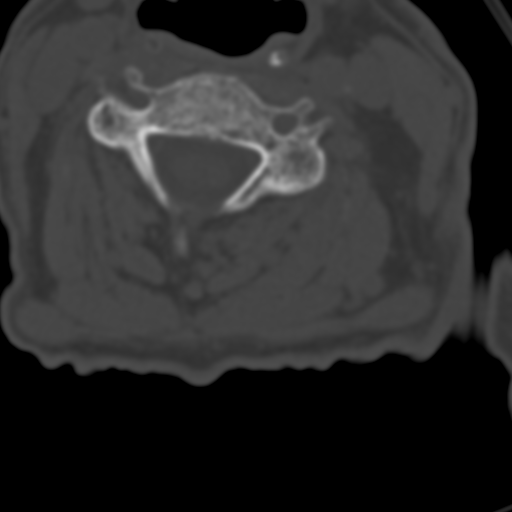
[im 55/82  bone]
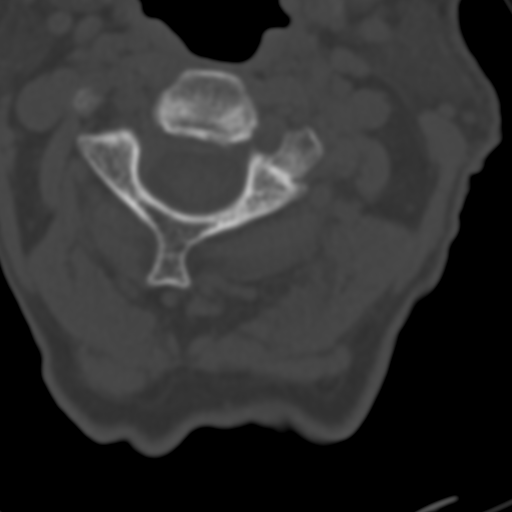
[im 68/82  soft-tissue]
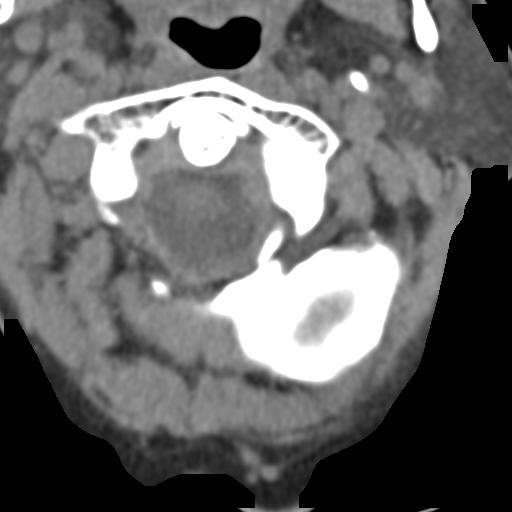
[im 68/82  bone]
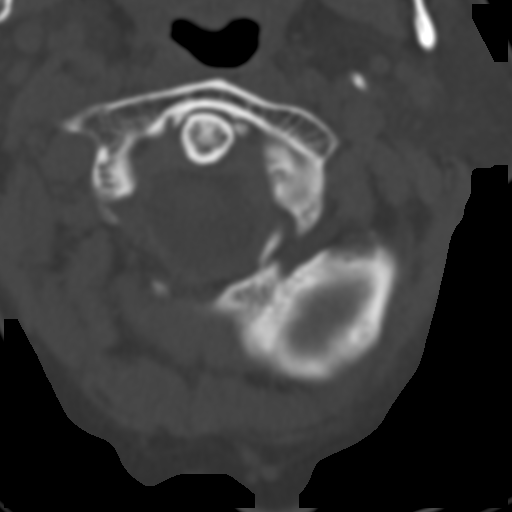

[8 of 14 positions shown; findings below may reference images not displayed]

FINDINGS: There is no evidence of acute infarction, mass lesion, or
intra- or extra-axial hemorrhage on CT.

Prominence of the ventricles and sulci reflects mild cortical
volume loss.  Cerebellar atrophy is noted.  Mild chronic infarct is
suggested at the cerebellar hemispheres bilaterally.  Scattered
periventricular and subcortical white matter change likely reflects
small vessel ischemic microangiopathy.

The brainstem and fourth ventricle are within normal limits.  The
basal ganglia are unremarkable in appearance.  The cerebral
hemispheres demonstrate grossly normal gray-white differentiation.
No mass effect or midline shift is seen.

There is no evidence of fracture; visualized osseous structures are
unremarkable in appearance.  The orbits are within normal limits.
Minimal mucosal thickening is noted within the left maxillary
sinus; the remaining paranasal sinuses and mastoid air cells are
well-aerated.  No significant soft tissue abnormalities are seen.
IMPRESSION: 1.  No evidence of traumatic intracranial injury or fracture.
2.  Mild cortical volume loss and scattered small vessel ischemic
microangiopathy.
3.  Mild chronic infarct suggested at the cerebellar hemispheres
bilaterally.
4.  Minimal mucosal thickening within the left maxillary sinus.

CT CERVICAL SPINE
FINDINGS: There is no evidence of fracture or subluxation.
Vertebral bodies demonstrate normal height and alignment.  There is
slight narrowing of the intervertebral disc space at C2-C3, with
mild associated calcification.  Prevertebral soft tissues are
within normal limits.  The visualized neural foramina are grossly
unremarkable.  There is incomplete fusion of the posterior elements
of C4.

The thyroid gland is unremarkable in appearance.  The minimally
visualized lung apices are clear.  No significant soft tissue
abnormalities are seen.
IMPRESSION: 1.  No evidence of fracture or subluxation along the cervical
spine.
2.  Incidental note of incomplete fusion of the posterior elements
of C4.

## 2013-03-19 ENCOUNTER — Encounter (INDEPENDENT_AMBULATORY_CARE_PROVIDER_SITE_OTHER): Payer: PRIVATE HEALTH INSURANCE | Admitting: General Surgery

## 2013-04-11 ENCOUNTER — Emergency Department (HOSPITAL_COMMUNITY)
Admission: EM | Admit: 2013-04-11 | Discharge: 2013-04-12 | Disposition: A | Discharge status: Other Acute Inpatient Hospital | Payer: Medicaid Other | Attending: Emergency Medicine | Admitting: Emergency Medicine

## 2013-04-11 ENCOUNTER — Encounter (HOSPITAL_COMMUNITY): Payer: Self-pay | Admitting: *Deleted

## 2013-04-11 DIAGNOSIS — F17200 Nicotine dependence, unspecified, uncomplicated: Secondary | ICD-10-CM | POA: Insufficient documentation

## 2013-04-11 DIAGNOSIS — Z9181 History of falling: Secondary | ICD-10-CM | POA: Insufficient documentation

## 2013-04-11 DIAGNOSIS — M25569 Pain in unspecified knee: Secondary | ICD-10-CM | POA: Insufficient documentation

## 2013-04-11 DIAGNOSIS — Z9889 Other specified postprocedural states: Secondary | ICD-10-CM | POA: Insufficient documentation

## 2013-04-11 DIAGNOSIS — R109 Unspecified abdominal pain: Secondary | ICD-10-CM | POA: Insufficient documentation

## 2013-04-11 DIAGNOSIS — F319 Bipolar disorder, unspecified: Secondary | ICD-10-CM | POA: Insufficient documentation

## 2013-04-11 DIAGNOSIS — G8929 Other chronic pain: Secondary | ICD-10-CM | POA: Insufficient documentation

## 2013-04-11 DIAGNOSIS — G4700 Insomnia, unspecified: Secondary | ICD-10-CM | POA: Insufficient documentation

## 2013-04-11 DIAGNOSIS — M25559 Pain in unspecified hip: Secondary | ICD-10-CM | POA: Insufficient documentation

## 2013-04-11 DIAGNOSIS — F10129 Alcohol abuse with intoxication, unspecified: Secondary | ICD-10-CM | POA: Diagnosis present

## 2013-04-11 DIAGNOSIS — R52 Pain, unspecified: Secondary | ICD-10-CM | POA: Insufficient documentation

## 2013-04-11 DIAGNOSIS — Z9119 Patient's noncompliance with other medical treatment and regimen: Secondary | ICD-10-CM | POA: Insufficient documentation

## 2013-04-11 DIAGNOSIS — Z8739 Personal history of other diseases of the musculoskeletal system and connective tissue: Secondary | ICD-10-CM | POA: Insufficient documentation

## 2013-04-11 DIAGNOSIS — Z8781 Personal history of (healed) traumatic fracture: Secondary | ICD-10-CM | POA: Insufficient documentation

## 2013-04-11 DIAGNOSIS — Z91199 Patient's noncompliance with other medical treatment and regimen due to unspecified reason: Secondary | ICD-10-CM | POA: Insufficient documentation

## 2013-04-11 DIAGNOSIS — R079 Chest pain, unspecified: Secondary | ICD-10-CM | POA: Insufficient documentation

## 2013-04-11 DIAGNOSIS — R42 Dizziness and giddiness: Secondary | ICD-10-CM | POA: Insufficient documentation

## 2013-04-11 DIAGNOSIS — Z8719 Personal history of other diseases of the digestive system: Secondary | ICD-10-CM | POA: Insufficient documentation

## 2013-04-11 DIAGNOSIS — R45851 Suicidal ideations: Secondary | ICD-10-CM | POA: Insufficient documentation

## 2013-04-11 DIAGNOSIS — Z872 Personal history of diseases of the skin and subcutaneous tissue: Secondary | ICD-10-CM | POA: Insufficient documentation

## 2013-04-11 DIAGNOSIS — F3160 Bipolar disorder, current episode mixed, unspecified: Secondary | ICD-10-CM | POA: Diagnosis present

## 2013-04-11 DIAGNOSIS — R110 Nausea: Secondary | ICD-10-CM | POA: Insufficient documentation

## 2013-04-11 LAB — RAPID URINE DRUG SCREEN, HOSP PERFORMED
Amphetamines: NOT DETECTED
Barbiturates: NOT DETECTED
Cocaine: NOT DETECTED
Tetrahydrocannabinol: NOT DETECTED

## 2013-04-11 LAB — COMPREHENSIVE METABOLIC PANEL
ALT: 13 U/L (ref 0–53)
Alkaline Phosphatase: 75 U/L (ref 39–117)
CO2: 25 mEq/L (ref 19–32)
GFR calc Af Amer: >90 mL/min (ref >90)
GFR calc non Af Amer: >90 mL/min (ref >90)
Glucose, Bld: 95 mg/dL (ref 70–99)
Potassium: 3.6 mEq/L (ref 3.5–5.1)
Sodium: 140 mEq/L (ref 135–145)

## 2013-04-11 LAB — CBC
Hemoglobin: 15.8 g/dL (ref 13.0–17.0)
RBC: 4.75 MIL/uL (ref 4.22–5.81)
WBC: 4.9 10*3/uL (ref 4.0–10.5)

## 2013-04-11 LAB — ACETAMINOPHEN LEVEL: Acetaminophen (Tylenol), Serum: <15 ug/mL (ref 10–30)

## 2013-04-11 MED ORDER — HYDROCODONE-ACETAMINOPHEN 5-325 MG PO TABS
2.0000 | ORAL_TABLET | Freq: Once | ORAL | Status: AC
  Administered 2013-04-12: 2 via ORAL
  Filled 2013-04-11: qty 2

## 2013-04-11 NOTE — ED Notes (Addendum)
Pt states he has not been able to sleep the last week and is having pain in his knee from surgery and hernia incision pain, "I'm just going crazy, feel like I am going to pass out, I need help" "I might hurt myself. I don't know, I don't want to be along"

## 2013-04-11 NOTE — ED Provider Notes (Signed)
CSN: [id number]     Arrival date & time 04/11/13  2026 History     None    Chief Complaint  Patient presents with  . Medical Clearance   (Consider location/radiation/quality/duration/timing/severity/associated sxs/prior Treatment) HPI History provided by pt.   Pt presents w/ several complaints.  Has not been taking his wellbutrin for bipolar disorder for 2 weeks d/t finances and has since had increased depression, insomnia, sleeping no more than 2hrs/d as well as SI w/out plan.  Denies HI.  Also c/o acute on chronic pain in left knee, left hip and lower abd, where he has had a hernia repair.  Exacerbation of pain not directly related to  trauma, but patient has had several falls recently, that he attributes to 6 months of loss of balance.  Denies vision changes and paresthesias. Has chronic weakness in L hip and knee only.  He has had intermittent nausea and chest pain recently.  He has been incontinent of urine and stool while walking x 3 months.  Occasionally has neck/back pain.  Is currently experiencing lightheadedness.  Drank one beer 3 hours ago.  Denies drug abuse.   Past Medical History  Diagnosis Date  . Osteoarthritis X years    left knee.  Distant hx (age 70) "dislocated" knee and required surgery, then crush injury to patella required knee cap removal.  . Diverticular disease     "itis" x 2 episodes (last was about 2007)  . Tobacco dependence   . Multiple rib fractures 10/09/10    s/p fall down a ravine--9th and 10th on right, contusion of left 7th and 8th ribs  . Fracture of metatarsal bone(s), closed     3rd, 4th, 5th mid shaft on left foot  . History of substance abuse     cocaine, marijuana, acid, alcohol (in remission since 2010)  . Solar dermatitis     face and ears  . Bipolar disorder     Has had multiple psych hospitalization in the past, most recently at [facility] 01/31/11-02/04/11.  Says meds made him emotionally blunted. (lamictal and wellbutrin).  .  Alcoholism     "Dry" since 2002  . Depression    Past Surgical History  Procedure Laterality Date  . Knee dislocation surgery  1966  . Patellectomy  1968    s/p crush injury  . Inguinal hernia repair      left  . Hernia repair      at 62 years of age  8  . Total knee arthroplasty  11/20/2011    Procedure: TOTAL KNEE ARTHROPLASTY;  Surgeon: Loreta Ave, MD;  Location: [facility] OR;  Service: Orthopedics;  Laterality: Left;  DR Eulah Pont WANTS FOR THIS CASE  . Inguinal hernia repair  06/15/2012    Procedure: HERNIA REPAIR INGUINAL INCARCERATED;  Surgeon: Mariella Saa, MD;  Location: [facility] ORS;  Service: General;  Laterality: Right;  . Bowel resection  06/15/2012    Procedure: SMALL BOWEL RESECTION;  Surgeon: Mariella Saa, MD;  Location: [facility] ORS;  Service: General;  Laterality: N/A;  . Femur im nail  08/06/2012    Procedure: INTRAMEDULLARY (IM) NAIL FEMORAL;  Surgeon: Javier Docker, MD;  Location: [facility] ORS;  Service: Orthopedics;  Laterality: Left;   Family History  Problem Relation Age of Onset  . Arthritis Mother   . Arthritis Father   . Cancer Brother     lung cancer.  Died in early 12s.   History  Substance Use Topics  .  Smoking status: Current Every Day Smoker -- 1.00 packs/day for 30 years    Types: Cigarettes  . Smokeless tobacco: Never Used  . Alcohol Use: Yes     Comment: daily    Review of Systems  All other systems reviewed and are negative.    Allergies  Codeine  Home Medications   Current Outpatient Rx  Name  Route  Sig  Dispense  Refill  . acetaminophen (TYLENOL) 500 MG tablet   Oral   Take 500 mg by mouth every 6 (six) hours as needed for pain.         . Multiple Vitamins-Minerals (MULTIVITAMIN WITH MINERALS) tablet   Oral   Take 1 tablet by mouth daily.          BP 123/75  Pulse 90  Temp(Src) 98.3 F (36.8 C) (Oral)  Resp 18  SpO2 100% Physical Exam  Nursing note and vitals reviewed. Constitutional: He is oriented to  person, place, and time. He appears well-developed and well-nourished. No distress.  HENT:  Head: Normocephalic and atraumatic.  Eyes:  Normal appearance  Neck: Normal range of motion.  Cardiovascular: Normal rate, regular rhythm and intact distal pulses.   Pulmonary/Chest: Effort normal and breath sounds normal.  Abdominal: Soft. Bowel sounds are normal. He exhibits no distension.  Surgical scar and edema of mid-line lower abd.  Mildly ttp.   Genitourinary:  nml rectal tone.  No saddle anesthesia or decreased distal sensation.  Musculoskeletal: Normal range of motion.  Surgical scars of L knee.  No edema, erythema, warmth.  Non-tender. Full passive ROM but painful.  Surgical scar lateral L hip.  Hip/groin ttp.  Pain w/ passive ROM.    Neurological: He is alert and oriented to person, place, and time. No sensory deficit. Coordination normal.  CN 3-12 intact.  No nystagmus. 5/5 and equal upper and lower extremity strength.  No past pointing.   Nml gait.  Can not perform romberg d/t lightheadedness.  Pt states that he is too sleepy.   Skin: Skin is warm and dry. No rash noted.  Psychiatric: He has a normal mood and affect. His behavior is normal.    ED Course   Procedures (including critical care time)   Date: 04/12/2013  Rate: 66  Rhythm: normal sinus rhythm  QRS Axis: normal  Intervals: normal  ST/T Wave abnormalities: normal  Conduction Disutrbances:right bundle branch block  Narrative Interpretation:   Old EKG Reviewed: unchanged   Labs Reviewed  ETHANOL - Abnormal; Notable for the following:    Alcohol, Ethyl (B) 136 (*)    All other components within normal limits  SALICYLATE LEVEL - Abnormal; Notable for the following:    Salicylate Lvl <2.0 (*)    All other components within normal limits  ACETAMINOPHEN LEVEL  CBC  COMPREHENSIVE METABOLIC PANEL  URINE RAPID DRUG SCREEN (HOSP PERFORMED)  URINALYSIS, ROUTINE W REFLEX MICROSCOPIC  POCT I-STAT TROPONIN I   No  results found. 1. Bipolar 1 disorder     MDM  62yo M w/ bipolar disorder, non-compliant w/ meds, presents w/ SI and insomnia.  Has several other complaints as well.   1- CP- Low risk ACS, EKG non-ischemic and troponin neg.   2- Acute on chronic L hip/knee pain- no acute trauma and no signs of septic arthritis on exam 3- Acute on chronic lower abd pain- had hernia surgery several months ago and has had gradually increasing edema and pain since 08/2012; no acute sx.  Has f/u scheduled w/  GS.  Mild infraumbilical tenderness but abd otherwise benign.   4- bowel/bladder incontinence w/ ambulation x 3 months- no lumbar spine tenderness, nml rectal tone, U/A neg for infection 5-Ataxia- no other neurologic complaints, no recent head trauma, no ataxia or other focal neuro deficits on exam.   Pt medically cleared and moved to psych ED.  Holding orders written.    Otilio Miu, PA-C 04/12/13 [phone]

## 2013-04-12 ENCOUNTER — Encounter (HOSPITAL_COMMUNITY): Payer: Self-pay | Admitting: Emergency Medicine

## 2013-04-12 ENCOUNTER — Inpatient Hospital Stay (HOSPITAL_COMMUNITY)
Admission: AD | Admit: 2013-04-12 | Discharge: 2013-04-15 | DRG: 897 | Disposition: A | Discharge status: Home / Self Care | Payer: Medicaid Other | Source: Intra-hospital | Attending: Psychiatry | Admitting: Psychiatry

## 2013-04-12 DIAGNOSIS — F1911 Other psychoactive substance abuse, in remission: Secondary | ICD-10-CM

## 2013-04-12 DIAGNOSIS — F10129 Alcohol abuse with intoxication, unspecified: Secondary | ICD-10-CM

## 2013-04-12 DIAGNOSIS — F319 Bipolar disorder, unspecified: Secondary | ICD-10-CM

## 2013-04-12 DIAGNOSIS — F3130 Bipolar disorder, current episode depressed, mild or moderate severity, unspecified: Secondary | ICD-10-CM | POA: Diagnosis present

## 2013-04-12 DIAGNOSIS — F1020 Alcohol dependence, uncomplicated: Principal | ICD-10-CM | POA: Diagnosis present

## 2013-04-12 DIAGNOSIS — Z79899 Other long term (current) drug therapy: Secondary | ICD-10-CM

## 2013-04-12 DIAGNOSIS — F3160 Bipolar disorder, current episode mixed, unspecified: Secondary | ICD-10-CM | POA: Diagnosis present

## 2013-04-12 DIAGNOSIS — F3161 Bipolar disorder, current episode mixed, mild: Secondary | ICD-10-CM

## 2013-04-12 DIAGNOSIS — F1010 Alcohol abuse, uncomplicated: Secondary | ICD-10-CM

## 2013-04-12 LAB — URINALYSIS, ROUTINE W REFLEX MICROSCOPIC
Glucose, UA: NEGATIVE mg/dL
Hgb urine dipstick: NEGATIVE
Leukocytes, UA: NEGATIVE
pH: 6 (ref 5.0–8.0)

## 2013-04-12 LAB — POCT I-STAT TROPONIN I: Troponin i, poc: 0 ng/mL (ref 0.00–0.08)

## 2013-04-12 MED ORDER — VITAMIN B-1 100 MG PO TABS: 100.0000 mg | ORAL_TABLET | Freq: Every day | ORAL | Status: DC

## 2013-04-12 MED ORDER — ZOLPIDEM TARTRATE 5 MG PO TABS
5.0000 mg | ORAL_TABLET | Freq: Every evening | ORAL | Status: DC | PRN
  Administered 2013-04-12: 5 mg via ORAL
  Filled 2013-04-12: qty 1

## 2013-04-12 MED ORDER — CHLORDIAZEPOXIDE HCL 25 MG PO CAPS: 25.0000 mg | ORAL_CAPSULE | Freq: Once | ORAL | Status: DC

## 2013-04-12 MED ORDER — LORAZEPAM 1 MG PO TABS: 0.0000 mg | ORAL_TABLET | Freq: Two times a day (BID) | ORAL | Status: DC

## 2013-04-12 MED ORDER — ACETAMINOPHEN 325 MG PO TABS
650.0000 mg | ORAL_TABLET | Freq: Four times a day (QID) | ORAL | Status: DC | PRN
  Administered 2013-04-12: 650 mg via ORAL

## 2013-04-12 MED ORDER — THIAMINE HCL 100 MG/ML IJ SOLN: 100.0000 mg | Freq: Every day | INTRAMUSCULAR | Status: DC

## 2013-04-12 MED ORDER — DIVALPROEX SODIUM ER 500 MG PO TB24
500.0000 mg | ORAL_TABLET | Freq: Every day | ORAL | Status: DC
  Administered 2013-04-12 – 2013-04-15 (×4): 500 mg via ORAL
  Filled 2013-04-12: qty 1
  Filled 2013-04-12: qty 4
  Filled 2013-04-12 (×4): qty 1

## 2013-04-12 MED ORDER — LORAZEPAM 1 MG PO TABS
1.0000 mg | ORAL_TABLET | Freq: Four times a day (QID) | ORAL | Status: DC | PRN
  Administered 2013-04-12: 1 mg via ORAL
  Filled 2013-04-12: qty 1

## 2013-04-12 MED ORDER — CHLORDIAZEPOXIDE HCL 25 MG PO CAPS
25.0000 mg | ORAL_CAPSULE | Freq: Three times a day (TID) | ORAL | Status: AC
  Administered 2013-04-13 – 2013-04-14 (×3): 25 mg via ORAL
  Filled 2013-04-12 (×3): qty 1

## 2013-04-12 MED ORDER — ADULT MULTIVITAMIN W/MINERALS CH
1.0000 | ORAL_TABLET | Freq: Every day | ORAL | Status: DC
  Administered 2013-04-12 – 2013-04-15 (×4): 1 via ORAL
  Filled 2013-04-12 (×6): qty 1

## 2013-04-12 MED ORDER — ADULT MULTIVITAMIN W/MINERALS CH: 1.0000 | ORAL_TABLET | Freq: Every day | ORAL | Status: DC

## 2013-04-12 MED ORDER — FOLIC ACID 1 MG PO TABS: 1.0000 mg | ORAL_TABLET | Freq: Every day | ORAL | Status: DC

## 2013-04-12 MED ORDER — NICOTINE 21 MG/24HR TD PT24
21.0000 mg | MEDICATED_PATCH | Freq: Every day | TRANSDERMAL | Status: DC
  Administered 2013-04-12 – 2013-04-15 (×4): 21 mg via TRANSDERMAL
  Filled 2013-04-12 (×7): qty 1

## 2013-04-12 MED ORDER — ONDANSETRON 4 MG PO TBDP: 4.0000 mg | ORAL_TABLET | Freq: Four times a day (QID) | ORAL | Status: AC | PRN

## 2013-04-12 MED ORDER — LORAZEPAM 2 MG/ML IJ SOLN: 1.0000 mg | Freq: Four times a day (QID) | INTRAMUSCULAR | Status: DC | PRN

## 2013-04-12 MED ORDER — MAGNESIUM HYDROXIDE 400 MG/5ML PO SUSP: 30.0000 mL | Freq: Every day | ORAL | Status: DC | PRN

## 2013-04-12 MED ORDER — LOPERAMIDE HCL 2 MG PO CAPS: 2.0000 mg | ORAL_CAPSULE | ORAL | Status: AC | PRN

## 2013-04-12 MED ORDER — TRAZODONE HCL 50 MG PO TABS
50.0000 mg | ORAL_TABLET | Freq: Every evening | ORAL | Status: DC | PRN
  Administered 2013-04-13 – 2013-04-14 (×3): 50 mg via ORAL
  Filled 2013-04-12: qty 8
  Filled 2013-04-12 (×2): qty 1

## 2013-04-12 MED ORDER — VITAMIN B-1 100 MG PO TABS
100.0000 mg | ORAL_TABLET | Freq: Every day | ORAL | Status: DC
  Administered 2013-04-12 – 2013-04-15 (×4): 100 mg via ORAL
  Filled 2013-04-12 (×6): qty 1

## 2013-04-12 MED ORDER — ALUM & MAG HYDROXIDE-SIMETH 200-200-20 MG/5ML PO SUSP: 30.0000 mL | ORAL | Status: DC | PRN

## 2013-04-12 MED ORDER — CHLORDIAZEPOXIDE HCL 25 MG PO CAPS
25.0000 mg | ORAL_CAPSULE | Freq: Four times a day (QID) | ORAL | Status: AC
  Administered 2013-04-12 – 2013-04-13 (×6): 25 mg via ORAL
  Filled 2013-04-12 (×6): qty 1

## 2013-04-12 MED ORDER — HYDROXYZINE HCL 25 MG PO TABS: 25.0000 mg | ORAL_TABLET | Freq: Four times a day (QID) | ORAL | Status: AC | PRN

## 2013-04-12 MED ORDER — LORAZEPAM 1 MG PO TABS: 0.0000 mg | ORAL_TABLET | Freq: Four times a day (QID) | ORAL | Status: DC

## 2013-04-12 MED ORDER — CHLORDIAZEPOXIDE HCL 25 MG PO CAPS: 25.0000 mg | ORAL_CAPSULE | Freq: Every day | ORAL | Status: DC

## 2013-04-12 MED ORDER — CHLORDIAZEPOXIDE HCL 25 MG PO CAPS
25.0000 mg | ORAL_CAPSULE | ORAL | Status: AC
  Administered 2013-04-14 – 2013-04-15 (×2): 25 mg via ORAL
  Filled 2013-04-12 (×2): qty 1

## 2013-04-12 MED ORDER — LORAZEPAM 1 MG PO TABS: 1.0000 mg | ORAL_TABLET | Freq: Four times a day (QID) | ORAL | Status: DC | PRN

## 2013-04-12 MED ORDER — ONDANSETRON HCL 4 MG/2ML IJ SOLN
INTRAMUSCULAR | Status: AC
  Filled 2013-04-12: qty 2

## 2013-04-12 MED ORDER — CHLORDIAZEPOXIDE HCL 25 MG PO CAPS: 25.0000 mg | ORAL_CAPSULE | Freq: Four times a day (QID) | ORAL | Status: AC | PRN

## 2013-04-12 NOTE — ED Notes (Signed)
Report given from Shorter, RN pt will go to room 41

## 2013-04-12 NOTE — [facility] Suicide Risk Assessment (Signed)
Suicide Risk Assessment  Discharge Assessment     Demographic Factors:  Male  Mental Status Per Nursing Assessment::   On Admission:  NA  Current Mental Status by Physician: In full contact with reality. Wants to be discharged today. States that he has to go out and take care of some business. States he has a bed at [facility]. Does not want to stay here pat today. He denies suicidal ideas, plans or intent. No evidence of acute withdrawal States the Seroquel is helping his sleep and the voices. 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  Patients presenting with no risk factors but with morbid ruminations; may be classified as minimal risk based on the severity of the depressive symptoms  Discharge Diagnoses:   AXIS I:  Alcohol Dependence/Mood Disorder NOS/Psychotic Disorder NOS AXIS II:  Deferred AXIS III:   Past Medical History  Diagnosis Date  . Osteoarthritis X years    left knee.  Distant hx (age 28) "dislocated" knee and required surgery, then crush injury to patella required knee cap removal.  . Diverticular disease     "itis" x 2 episodes (last was about 2007)  . Tobacco dependence   . Multiple rib fractures 10/09/10    s/p fall down a ravine--9th and 10th on right, contusion of left 7th and 8th ribs  . Fracture of metatarsal bone(s), closed     3rd, 4th, 5th mid shaft on left foot  . History of substance abuse     cocaine, marijuana, acid, alcohol (in remission since 2010)  . Solar dermatitis     face and ears  . Bipolar  disorder     Has had multiple psych hospitalization in the past, most recently at [facility] 01/31/11-02/04/11.  Says meds made him emotionally blunted. (lamictal and wellbutrin).  . Alcoholism     "Dry" since 2002  . Depression    AXIS IV:  other psychosocial or environmental problems AXIS V:  61-70 mild symptoms  Plan Of Care/Follow-up recommendations:  Activity:  as tolerated Diet:  regular Follow [facility] Is patient on multiple antipsychotic therapies at discharge:  No   Has Patient had three or more failed trials of antipsychotic monotherapy by history:  No  Recommended Plan for Multiple Antipsychotic Therapies: N/A   Lyndol Vanderheiden A 04/12/2013, 1:57 PM

## 2013-04-12 NOTE — [facility] Group Notes (Signed)
[facility] LCSW Aftercare Discharge Planning Group Note   04/12/2013 10:30 AM  Participation Quality: Appropriate   Mood/Affect:  Appropriate  Depression Rating:  7  Anxiety Rating:  7  Thoughts of Suicide:  No Will you contract for safety?   NA  Current AVH:  No  Plan for Discharge/Comments:  Pt stated that he decided to come to [facility] after struggling with bipolar symptoms (manic), inability to sleep, and substance abuse (alcohol detox). Pt stated that he was at [facility] many years ago but had not since received i/p treatment. Pt had been seeing Dr. Earley Favor at [facility] but does not want to go back being that this office is in [facility]. Pt hoping to return home after d/c and plans to followup O/p.-[facility]? Pt has [employer].   Transportation Means: walk/bus  Supports: none identified.  [employer], [employer]

## 2013-04-12 NOTE — Progress Notes (Signed)
Adult Psychoeducational Group Note  Date:  04/12/2013 Time:  11:00AM Group Topic/Focus:  Self Care:   The focus of this group is to help patients understand the importance of self-care in order to improve or restore emotional, physical, spiritual, interpersonal, and financial health.  Participation Level:  Active  Participation Quality:  Appropriate and Attentive  Affect:  Appropriate  Cognitive:  Alert and Appropriate  Insight: Appropriate  Engagement in Group:  Engaged  Modes of Intervention:  Discussion  Additional Comments:  Pt. Was attentive and appropriate during today's group discussion. Pt. Was able to complete self care assessment. Pt. Stated that he need to improve on getting enough sleep and communicating with support system. Pt. Stated that he is willing to improve on his behaviors.   Bing Plume D 04/12/2013, 1:31 PM

## 2013-04-12 NOTE — Tx Team (Signed)
Interdisciplinary Treatment Plan Update (Adult)  Date: 04/12/2013   Time Reviewed: 12:20 PM  Progress in Treatment:  Attending groups: Yes Participating in groups:  Yes Taking medication as prescribed: Yes  Tolerating medication: Yes  Family/Significant othe contact made: Not yet. SPE required for this pt.   Patient understands diagnosis: Yes, AEB seeking treatment for SI, mood stabilization, and ETOH detox.  Discussing patient identified problems/goals with staff: Yes  Medical problems stabilized or resolved: Yes  Denies suicidal/homicidal ideation: Yes  Patient has not harmed self or Others: Yes  New problem(s) identified: n/a  Discharge Plan or Barriers: Pt would like to followup o/p and return home where he lives alone in [place]. Pt reports that he had been going to [facility] but would like to be referred elsewhere. CSW assessing for appropriate referrals.  Additional comments: Patient is a 62 yo male admitted voluntarily for ETOH detox and passive SI with no plans. Pt states his wife left him last July and has been showing up to places that he frequents with her new boyfriend intentionally. Pt states another stressor is the fact that he has no contact with his 83 yo son and that he has been blocked from his son's phone. Pt states it upsets him that his son married his stepdaughter from his first marriage. Pt also states that living alone is a stressor and he often finds that he is bored and feels trapped inside his home. Pt with insomnia for two weeks. Pt c/o racing thoughts and hearing voices at times and states he just wants to "get my bipolar under control so I can get back to living". Pt states that he drinks a six pack of beer daily and a pint of vodka. Pt with recent hx of multiple falls and is currently unsteady while ambulating. Reason for Continuation of Hospitalization: Librium taper-withdrawals Mood stabilization Estimated length of stay: 3-5 days  For review of  initial/current patient goals, please see plan of care.  Attendees:  Patient:    Family:    Physician: Geoffery Lyons MD 04/12/2013 12:22 PM   Nursing: Jan RN 04/12/2013 12:22 PM   Clinical Social Worker [employer], LCSWA  04/12/2013 12:22 PM   Other: Maureen Ralphs RN 04/12/2013 12:22 PM   Other: Lupita Leash RN 04/12/2013 12:22 PM   Other: Massie Kluver, Community Care Coordinator  04/12/2013 12:22 PM   Other:    Scribe for Treatment Team:  Trula Slade LCSWA 04/12/2013 12:22 PM

## 2013-04-12 NOTE — Progress Notes (Signed)
D:Pt in his bed resting. Respirations are even and unlabored. A:Offered 1:1 observations. R:Safety maintained on the unit.

## 2013-04-12 NOTE — [facility] Group Notes (Signed)
[facility] LCSW Group Therapy  04/12/2013 4:32 PM  Type of Therapy:  Group Therapy  Participation Level:  Minimal  Participation Quality:  Appropriate  Affect:  Appropriate  Cognitive:  Alert  Insight:  Engaged  Engagement in Therapy:  Engaged  Modes of Intervention:  Discussion, Education, Exploration, Socialization and Support  Summary of Progress/Problems: Today's Topic: Overcoming Obstacles. Pt identified obstacles faced currently and processed barriers involved in overcoming these obstacles. Pt identified steps necessary for overcoming these obstacles and explored motivation (internal and external) for facing these difficulties head on. Pt further identified one area of concern in their lives and chose a skill of focus pulled from their "toolbox." Shawn [name] came to group about 30 minutes late. He stated that his primary obstacle in remaining sober involves "extreme lonliness, isolation, and boredom." He appears to be making progress in the group setting and exhibits improving insight AEB his ability to identify ways to combat loneliness and boredom--"I plan to get set up with [facility]'s SA IOP program, attend AA every day, and go to as many group meetings as I can." "I want to improve my social support network and get back in touch with my sponsor."    Shawn [name], Shawn [name] 04/12/2013, 4:32 PM

## 2013-04-12 NOTE — Tx Team (Signed)
Initial Interdisciplinary Treatment Plan  PATIENT STRENGTHS: (choose at least two) Ability for insight Average or above average intelligence Communication skills Motivation for treatment/growth Religious Affiliation  PATIENT STRESSORS: Financial difficulties Health problems Loss of recently separated Substance abuse   PROBLEM LIST: Problem List/Patient Goals Date to be addressed Date deferred Reason deferred Estimated date of resolution  Suicide Risk 04/12/13     Depression 04/12/13     Substance Abuse 04/12/13                                          DISCHARGE CRITERIA:  Adequate post-discharge living arrangements Improved stabilization in mood, thinking, and/or behavior Withdrawal symptoms are absent or subacute and managed without 24-hour nursing intervention  PRELIMINARY DISCHARGE PLAN: Outpatient therapy Return to previous living arrangement  PATIENT/FAMIILY INVOLVEMENT: This treatment plan has been presented to and reviewed with the patient, DACODA FINLAY, and/or family member.  The patient and family have been given the opportunity to ask questions and make suggestions.  Renaee Munda 04/12/2013, 4:42 AM

## 2013-04-12 NOTE — Progress Notes (Signed)
D:Pt is blunted and depressed. He rates his pain as a 9 on 1-10 scale with 10 being the worst. Pain is located in knee and hip.Pt has an x ray picture that he carries with him showing his knee and hip. A:Offered prn tylenol and ice/heat. Offered 1:1 observation. R:Pt refused pain medication and plans to talk with the doctor about changing medication. Pt denies si and hi. Safety maintained on the unit.

## 2013-04-12 NOTE — ED Provider Notes (Signed)
Medical screening examination/treatment/procedure(s) were performed by non-physician practitioner and as supervising physician I was immediately available for consultation/collaboration.    Vida Roller, MD 04/12/13 [phone]

## 2013-04-12 NOTE — Consult Note (Signed)
Agree with disposition. 

## 2013-04-12 NOTE — Consult Note (Signed)
[facility] Psychiatry Consult   Reason for Consult:  ED Referral-see ED provider note Referring Physician:  ED Providers Shawn [name] is an 62 y.o. male.  Assessment: AXIS I:  Bipolar, mixed;Alcohol abuse with acute intoxication AXIS II:  Deferred AXIS III:   Past Medical History  Diagnosis Date  . Osteoarthritis X years    left knee.  Distant hx (age 17) "dislocated" knee and required surgery, then crush injury to patella required knee cap removal.  . Diverticular disease     "itis" x 2 episodes (last was about 2007)  . Tobacco dependence   . Multiple rib fractures 10/09/10    s/p fall down a ravine--9th and 10th on right, contusion of left 7th and 8th ribs  . Fracture of metatarsal bone(s), closed     3rd, 4th, 5th mid shaft on left foot  . History of substance abuse     cocaine, marijuana, acid, alcohol (in remission since 2010)  . Solar dermatitis     face and ears  . Bipolar disorder     Has had multiple psych hospitalization in the past, most recently at [facility] 01/31/11-02/04/11.  Says meds made him emotionally blunted. (lamictal and wellbutrin).  . Alcoholism     "Dry" since 2002  . Depression    AXIS IV:  other psychosocial or environmental problems AXIS V:  41-50 serious symptoms  Plan:  Recommend psychiatric Inpatient admission when medically cleared.  Subjective:   Shawn [name] is a 62 y.o. male patient admitted with relapse of his bipolar disorder with racing thoughts [name] got to get this thinking sorted out") and severe depression with suicidal thoughts..He stopped taking his Wellbutrin 2 weeks ago because felt it wasn't helping. He states he was hospitalized at [facility] in 2012 and put on a second medication with Wellbutrin that really made a difference but he cannot recall the name. He denies auditory or visual hallucinations yet but states he has had them in past and feels they will return if he doesn't get help. He began drinking alcohol again in July of  2013 and admits he has been drinking heavily the past 2 weks.He c/o only getting 2 hrs of sleep at nite.His appetite is off as well.Pt relates that the past year has ben extremely stressful with a LT TKR followed by an incarcerated inguinal hernis surgery that has left him unable to control bowel and bladder at times and finally a femoral fx that required full pinning.  HPI:  As above HPI Elements:   Context:  as in Subjective above.  Past Psychiatric History: Past Medical History  Diagnosis Date  . Osteoarthritis X years    left knee.  Distant hx (age 35) "dislocated" knee and required surgery, then crush injury to patella required knee cap removal.  . Diverticular disease     "itis" x 2 episodes (last was about 2007)  . Tobacco dependence   . Multiple rib fractures 10/09/10    s/p fall down a ravine--9th and 10th on right, contusion of left 7th and 8th ribs  . Fracture of metatarsal bone(s), closed     3rd, 4th, 5th mid shaft on left foot  . History of substance abuse     cocaine, marijuana, acid, alcohol (in remission since 2010)  . Solar dermatitis     face and ears  . Bipolar disorder     Has had multiple psych hospitalization in the past, most recently at [facility] 01/31/11-02/04/11.  Says meds  made him emotionally blunted. (lamictal and wellbutrin).  . Alcoholism     "Dry" since 2002  . Depression     reports that he has been smoking Cigarettes.  He has a 30 pack-year smoking history. He has never used smokeless tobacco. He reports that  drinks alcohol. He reports that he does not use illicit drugs. Family History  Problem Relation Age of Onset  . Arthritis Mother   . Arthritis Father   . Cancer Brother     lung cancer.  Died in early 18s.           Allergies:   Allergies  Allergen Reactions  . Codeine Nausea Only    Past Psychiatric History: Diagnosis:   Bipolar DO;Alcohol abuse/dependence  Hospitalizations:  Multiple -most recent at [facility] in 2012 per HPI   Outpatient Care:  Sees Dr Marvel Plan PCP  Substance Abuse Care:  None-relapsed 03/2012 on alcohol-uses no other drugs  Self-Mutilation:  NA  Suicidal Attempts:  NA  Violent Behaviors:  NA   Objective: Blood pressure 148/82, pulse 71, temperature 98.3 F (36.8 C), temperature source Oral, resp. rate 20, SpO2 98.00%.There is no weight on file to calculate BMI. Results for orders placed during the hospital encounter of 04/11/13 (from the past 72 hour(s))  URINE RAPID DRUG SCREEN (HOSP PERFORMED)     Status: None   Collection Time    04/11/13  9:05 PM      Result Value Range   Opiates NONE DETECTED  NONE DETECTED   Cocaine NONE DETECTED  NONE DETECTED   Benzodiazepines NONE DETECTED  NONE DETECTED   Amphetamines NONE DETECTED  NONE DETECTED   Tetrahydrocannabinol NONE DETECTED  NONE DETECTED   Barbiturates NONE DETECTED  NONE DETECTED   Comment:            DRUG SCREEN FOR MEDICAL PURPOSES     ONLY.  IF CONFIRMATION IS NEEDED     FOR ANY PURPOSE, NOTIFY LAB     WITHIN 5 DAYS.                LOWEST DETECTABLE LIMITS     FOR URINE DRUG SCREEN     Drug Class       Cutoff (ng/mL)     Amphetamine      1000     Barbiturate      200     Benzodiazepine   200     Tricyclics       300     Opiates          300     Cocaine          300     THC              50  ACETAMINOPHEN LEVEL     Status: None   Collection Time    04/11/13 10:04 PM      Result Value Range   Acetaminophen (Tylenol), Serum <15.0  10 - 30 ug/mL   Comment:            THERAPEUTIC CONCENTRATIONS VARY     SIGNIFICANTLY. A RANGE OF 10-30     ug/mL MAY BE AN EFFECTIVE     CONCENTRATION FOR MANY PATIENTS.     HOWEVER, SOME ARE BEST TREATED     AT CONCENTRATIONS OUTSIDE THIS     RANGE.     ACETAMINOPHEN CONCENTRATIONS     >150 ug/mL AT 4 HOURS AFTER     INGESTION AND >50  ug/mL AT 12     HOURS AFTER INGESTION ARE     OFTEN ASSOCIATED WITH TOXIC     REACTIONS.  CBC     Status: None   Collection Time    04/11/13 10:04 PM       Result Value Range   WBC 4.9  4.0 - 10.5 K/uL   RBC 4.75  4.22 - 5.81 MIL/uL   Hemoglobin 15.8  13.0 - 17.0 g/dL   HCT 96.0  45.4 - 09.8 %   MCV 94.3  78.0 - 100.0 fL   MCH 33.3  26.0 - 34.0 pg   MCHC 35.3  30.0 - 36.0 g/dL   RDW 11.9  14.7 - 82.9 %   Platelets 281  150 - 400 K/uL  COMPREHENSIVE METABOLIC PANEL     Status: None   Collection Time    04/11/13 10:04 PM      Result Value Range   Sodium 140  135 - 145 mEq/L   Potassium 3.6  3.5 - 5.1 mEq/L   Chloride 100  96 - 112 mEq/L   CO2 25  19 - 32 mEq/L   Glucose, Bld 95  70 - 99 mg/dL   BUN 9  6 - 23 mg/dL   Creatinine, Ser 5.62  0.50 - 1.35 mg/dL   Calcium 9.3  8.4 - 13.0 mg/dL   Total Protein 6.6  6.0 - 8.3 g/dL   Albumin 3.7  3.5 - 5.2 g/dL   AST 27  0 - 37 U/L   ALT 13  0 - 53 U/L   Alkaline Phosphatase 75  39 - 117 U/L   Total Bilirubin 0.4  0.3 - 1.2 mg/dL   GFR calc non Af Amer >90  >90 mL/min   GFR calc Af Amer >90  >90 mL/min   Comment:            The eGFR has been calculated     using the CKD EPI equation.     This calculation has not been     validated in all clinical     situations.     eGFR's persistently     <90 mL/min signify     possible Chronic Kidney Disease.  ETHANOL     Status: Abnormal   Collection Time    04/11/13 10:04 PM      Result Value Range   Alcohol, Ethyl (B) 136 (*) 0 - 11 mg/dL   Comment:            LOWEST DETECTABLE LIMIT FOR     SERUM ALCOHOL IS 11 mg/dL     FOR MEDICAL PURPOSES ONLY  SALICYLATE LEVEL     Status: Abnormal   Collection Time    04/11/13 10:04 PM      Result Value Range   Salicylate Lvl <2.0 (*) 2.8 - 20.0 mg/dL  URINALYSIS, ROUTINE W REFLEX MICROSCOPIC     Status: None   Collection Time    04/11/13 11:43 PM      Result Value Range   Color, Urine YELLOW  YELLOW   APPearance CLEAR  CLEAR   Specific Gravity, Urine 1.016  1.005 - 1.030   pH 6.0  5.0 - 8.0   Glucose, UA NEGATIVE  NEGATIVE mg/dL   Hgb urine dipstick NEGATIVE  NEGATIVE   Bilirubin Urine  NEGATIVE  NEGATIVE   Ketones, ur NEGATIVE  NEGATIVE mg/dL   Protein, ur NEGATIVE  NEGATIVE mg/dL   Urobilinogen, UA 1.0  0.0 -  1.0 mg/dL   Nitrite NEGATIVE  NEGATIVE   Leukocytes, UA NEGATIVE  NEGATIVE   Comment: MICROSCOPIC NOT DONE ON URINES WITH NEGATIVE PROTEIN, BLOOD, LEUKOCYTES, NITRITE, OR GLUCOSE <1000 mg/dL.  POCT I-STAT TROPONIN I     Status: None   Collection Time    04/11/13 11:50 PM      Result Value Range   Troponin i, poc 0.00  0.00 - 0.08 ng/mL   Comment 3            Comment: Due to the release kinetics of cTnI,     a negative result within the first hours     of the onset of symptoms does not rule out     myocardial infarction with certainty.     If myocardial infarction is still suspected,     repeat the test at appropriate intervals.   Labs are reviewed and are pertinent for alcohol intoxication.  Current Facility-Administered Medications  Medication Dose Route Frequency Provider Last Rate Last Dose  . folic acid (FOLVITE) tablet 1 mg  1 mg Oral Daily Catherine E Schinlever, PA-C      . folic acid (FOLVITE) tablet 1 mg  1 mg Oral Daily Arie Sabina Schinlever, PA-C      . LORazepam (ATIVAN) tablet 1 mg  1 mg Oral Q6H PRN Otilio Miu, PA-C   1 mg at 04/12/13 0153   Or  . LORazepam (ATIVAN) injection 1 mg  1 mg Intravenous Q6H PRN Otilio Miu, PA-C      . LORazepam (ATIVAN) tablet 1 mg  1 mg Oral Q6H PRN Otilio Miu, PA-C       Or  . LORazepam (ATIVAN) injection 1 mg  1 mg Intravenous Q6H PRN Otilio Miu, PA-C      . LORazepam (ATIVAN) tablet 0-4 mg  0-4 mg Oral Q6H Catherine E Schinlever, PA-C       Followed by  . [START ON 04/14/2013] LORazepam (ATIVAN) tablet 0-4 mg  0-4 mg Oral Q12H Catherine E Schinlever, PA-C      . multivitamin with minerals tablet 1 tablet  1 tablet Oral Daily Catherine E Schinlever, PA-C      . multivitamin with minerals tablet 1 tablet  1 tablet Oral Daily Catherine E Schinlever, PA-C      .  thiamine (VITAMIN B-1) tablet 100 mg  100 mg Oral Daily Catherine E Schinlever, PA-C       Or  . thiamine (B-1) injection 100 mg  100 mg Intravenous Daily Catherine E Schinlever, PA-C      . thiamine (VITAMIN B-1) tablet 100 mg  100 mg Oral Daily Catherine E Schinlever, PA-C       Or  . thiamine (B-1) injection 100 mg  100 mg Intravenous Daily Catherine E Schinlever, PA-C      . zolpidem (AMBIEN) tablet 5 mg  5 mg Oral QHS PRN Otilio Miu, PA-C   5 mg at 04/12/13 0981   Current Outpatient Prescriptions  Medication Sig Dispense Refill  . acetaminophen (TYLENOL) 500 MG tablet Take 500 mg by mouth every 6 (six) hours as needed for pain.      . Multiple Vitamins-Minerals (MULTIVITAMIN WITH MINERALS) tablet Take 1 tablet by mouth daily.        Psychiatric Specialty Exam:     Blood pressure 148/82, pulse 71, temperature 98.3 F (36.8 C), temperature source Oral, resp. rate 20, SpO2 98.00%.There is no weight on file to calculate BMI.  General  Appearance: Fairly Groomed  Patent attorney::  Good  Speech:  Pressured  Volume:  Increased  Mood:  Dysphoric  Affect:  Congruent  Thought Process:  Disorganized  Orientation:  Full (Time, Place, and Person)  Thought Content:  racing  Suicidal Thoughts:  Yes.  without intent/plan  Homicidal Thoughts:  No  Memory:  Negative  Judgement:  Fair  Insight:  Fair  Psychomotor Activity:  Restlessness  Concentration:  Fair  Recall:  Fair  Akathisia:  NA  Handed:  Right  AIMS (if indicated):     Assets:  Resilience  Sleep:      Treatment Plan Summary: Admit to [facility] dual diagnosis for treatment  Teshia Mahone E 04/12/2013 2:13 AM

## 2013-04-12 NOTE — Progress Notes (Signed)
Patient ID: Shawn [name], male   DOB: 01-26-1951, 62 y.o.   MRN: [id number] D: Patient high fall risk, placed on 1:1 observation. A: Monitor on 1:1 basis, assist with ADLs as needed. R: Pt remains safe at this time.
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
## 2013-04-12 NOTE — ED Notes (Signed)
Per Maryjean Morn, PA, pt is accepted to [facility] to [place] [id number]. Writer will do voluntary paperwork as there's no [facility] CSW to complete the paperwork.

## 2013-04-12 NOTE — ED Notes (Signed)
Pt being transported to [facility] via security and a tech.

## 2013-04-12 NOTE — Progress Notes (Signed)
D:Pt is finishing his lunch. Pt reports that his appetite is increasing. A:Offered support and 1:1 monitoring. R:Pt denies si and hi. Pt denies hallucinations. Safety maintained on the unit.
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
## 2013-04-12 NOTE — BHH Suicide Risk Assessment (Signed)
Suicide Risk Assessment  Admission Assessment     Nursing information obtained from:  Patient Demographic factors:  Divorced or widowed;Caucasian;Low socioeconomic status;Living alone;Unemployed Current Mental Status:  NA Loss Factors:  Loss of significant relationship;Decline in physical health;Financial problems / change in socioeconomic status Historical Factors:  Family history of mental illness or substance abuse Risk Reduction Factors:  Religious beliefs about death  CLINICAL FACTORS:   Bipolar Disorder:   Depressive phase Alcohol/Substance Abuse/Dependencies  COGNITIVE FEATURES THAT CONTRIBUTE TO RISK:  Polarized thinking Thought constriction (tunnel vision)    SUICIDE RISK:   Moderate:  Frequent suicidal ideation with limited intensity, and duration, some specificity in terms of plans, no associated intent, good self-control, limited dysphoria/symptomatology, some risk factors present, and identifiable protective factors, including available and accessible social support.  PLAN OF CARE: Supportive approach/coping skills/relape prevention                              Start Depakote ER 500 mg HS  I certify that inpatient services furnished can reasonably be expected to improve the patient's condition.  Shawn [name] A 04/12/2013, 4:09 PM

## 2013-04-12 NOTE — Progress Notes (Signed)
RN 1:1 note 2000  Pt is sitting in the dayroom with sitter. Pt is waiting for the 2000 group to begin. Pt was administered tylenol for 8 out 10 left hip pain.  A: Continued support and availability as needed was extended to this pt. Staff continue to monitor pt with continuous 1:1 observation. R:  Pt remains safe at this time.
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
## 2013-04-12 NOTE — Progress Notes (Signed)
Adult Psychoeducational Group Note  Date:  04/12/2013 Time:  11:01 PM  Group Topic/Focus:  Goals Group:   The focus of this group is to help patients establish daily goals to achieve during treatment and discuss how the patient can incorporate goal setting into their daily lives to aide in recovery.  Participation Level:  Active  Participation Quality:  Appropriate  Affect:  Appropriate  Cognitive:  Appropriate  Insight: Appropriate  Engagement in Group:  Engaged  Modes of Intervention:  Discussion  Additional Comments:  Pt stated that he happy to speak with counselors, Child psychotherapist, and Dr to get new meds.  Terie Purser R 04/12/2013, 11:01 PM

## 2013-04-12 NOTE — ED Notes (Signed)
Extender Maryjean Morn, PA in with pt currently.

## 2013-04-13 MED ORDER — GABAPENTIN 100 MG PO CAPS
100.0000 mg | ORAL_CAPSULE | Freq: Three times a day (TID) | ORAL | Status: DC
  Administered 2013-04-13 – 2013-04-15 (×6): 100 mg via ORAL
  Filled 2013-04-13: qty 1
  Filled 2013-04-13: qty 12
  Filled 2013-04-13 (×2): qty 1
  Filled 2013-04-13: qty 12
  Filled 2013-04-13: qty 1
  Filled 2013-04-13: qty 12
  Filled 2013-04-13 (×4): qty 1

## 2013-04-13 MED ORDER — METHOCARBAMOL 500 MG PO TABS
500.0000 mg | ORAL_TABLET | Freq: Three times a day (TID) | ORAL | Status: DC
  Administered 2013-04-13 – 2013-04-15 (×6): 500 mg via ORAL
  Filled 2013-04-13 (×5): qty 1
  Filled 2013-04-13: qty 12
  Filled 2013-04-13: qty 1
  Filled 2013-04-13 (×2): qty 12
  Filled 2013-04-13 (×2): qty 1

## 2013-04-13 MED ORDER — LOPERAMIDE HCL 2 MG PO CAPS: 2.0000 mg | ORAL_CAPSULE | ORAL | Status: DC | PRN

## 2013-04-13 NOTE — Progress Notes (Signed)
RN 1:1 note 0400 D: Pt in bed laying in right lateral position. Pt's respirations are even and unlabored. Pt appears to be in no signs of distress at this time. Sitter at pt's bedside. A:  1:1 continuous observation remains in effect for this pt. R:  Pt remains safe at this time.

## 2013-04-13 NOTE — Progress Notes (Signed)
[facility] MD Progress Note  04/13/2013 4:06 PM Shawn [name]  MRN:  [id number] Subjective:  Shawn [name] endorses that he is on track to resume long term abstinence. He states he really wants to continue to work on it. He endorses pain on his side and knee. He has not followed up with his orthopedic surgeon. He is planning to do so when he gets out of here. He is planning to go back to AA as it worked well for him before. Has tolerated the Depakote well so far Diagnosis:  Alcohol Dependence, Bipolar Disoder  ADL's:  Intact  Sleep: Fair  Appetite:  Poor  Suicidal Ideation:  Plan:  denies Intent:  denies Means:  denies Homicidal Ideation:  Plan:  denies Intent:  denies Means:  denies AEB (as evidenced by):  Psychiatric Specialty Exam: Review of Systems  Constitutional: Negative.   HENT: Negative.   Eyes: Negative.   Respiratory: Negative.   Cardiovascular: Negative.   Gastrointestinal: Negative.   Genitourinary: Negative.   Musculoskeletal: Positive for back pain and joint pain.  Skin: Negative.   Neurological: Negative.   Endo/Heme/Allergies: Negative.   Psychiatric/Behavioral: Positive for substance abuse. The patient is nervous/anxious.     Blood pressure 118/77, pulse 74, temperature 97.5 F (36.4 C), temperature source Oral, resp. rate 18, height 5\' 11"  (1.803 m), weight 73.936 kg (163 lb).Body mass index is 22.74 kg/(m^2).  General Appearance: Disheveled  Eye Solicitor::  Fair  Speech:  Clear and Coherent and Slow  Volume:  Decreased  Mood:  Anxious and worried  Affect:  Restricted  Thought Process:  Coherent and Goal Directed  Orientation:  Full (Time, Place, and Person)  Thought Content:  somatically focused, ruminative  Suicidal Thoughts:  No  Homicidal Thoughts:  No  Memory:  Immediate;   Fair Recent;   Fair Remote;   Fair  Judgement:  Fair  Insight:  Present  Psychomotor Activity:  Restlessness  Concentration:  Fair  Recall:  Fair  Akathisia:  No  Handed:   Right  AIMS (if indicated):     Assets:  Desire for Improvement  Sleep:  Number of Hours: 6   Current Medications: Current Facility-Administered Medications  Medication Dose Route Frequency Provider Last Rate Last Dose  . acetaminophen (TYLENOL) tablet 650 mg  650 mg Oral Q6H PRN Court Joy, PA-C   650 mg at 04/12/13 2001  . alum & mag hydroxide-simeth (MAALOX/MYLANTA) 200-200-20 MG/5ML suspension 30 mL  30 mL Oral Q4H PRN Court Joy, PA-C      . chlordiazePOXIDE (LIBRIUM) capsule 25 mg  25 mg Oral Q6H PRN Court Joy, PA-C      . chlordiazePOXIDE (LIBRIUM) capsule 25 mg  25 mg Oral Once Court Joy, PA-C      . chlordiazePOXIDE (LIBRIUM) capsule 25 mg  25 mg Oral TID Court Joy, PA-C       Followed by  . [START ON 04/14/2013] chlordiazePOXIDE (LIBRIUM) capsule 25 mg  25 mg Oral BH-qamhs Court Joy, PA-C       Followed by  . [START ON 04/16/2013] chlordiazePOXIDE (LIBRIUM) capsule 25 mg  25 mg Oral Daily Court Joy, PA-C      . divalproex (DEPAKOTE ER) 24 hr tablet 500 mg  500 mg Oral Daily Rachael Fee, MD   500 mg at 04/13/13 0815  . gabapentin (NEURONTIN) capsule 100 mg  100 mg Oral TID Sanjuana Kava, NP      . hydrOXYzine (ATARAX/VISTARIL) tablet  25 mg  25 mg Oral Q6H PRN Court Joy, PA-C      . loperamide (IMODIUM) capsule 2-4 mg  2-4 mg Oral PRN Court Joy, PA-C      . magnesium hydroxide (MILK OF MAGNESIA) suspension 30 mL  30 mL Oral Daily PRN Court Joy, PA-C      . methocarbamol (ROBAXIN) tablet 500 mg  500 mg Oral TID Sanjuana Kava, NP      . multivitamin with minerals tablet 1 tablet  1 tablet Oral Daily Court Joy, PA-C   1 tablet at 04/13/13 0815  . nicotine (NICODERM CQ - dosed in mg/24 hours) patch 21 mg  21 mg Transdermal Daily Court Joy, PA-C   21 mg at 04/13/13 4098  . ondansetron (ZOFRAN-ODT) disintegrating tablet 4 mg  4 mg Oral Q6H PRN Court Joy, PA-C      . thiamine (VITAMIN B-1) tablet 100 mg  100 mg  Oral Daily Court Joy, PA-C   100 mg at 04/13/13 1191   Or  . thiamine (B-1) injection 100 mg  100 mg Intravenous Daily Court Joy, PA-C      . traZODone (DESYREL) tablet 50 mg  50 mg Oral QHS PRN,MR X 1 Court Joy, PA-C   50 mg at 04/13/13 4782    Lab Results:  Results for orders placed during the hospital encounter of 04/12/13 (from the past 48 hour(s))  GLUCOSE, CAPILLARY     Status: Abnormal   Collection Time    04/13/13 11:34 AM      Result Value Range   Glucose-Capillary 104 (*) 70 - 99 mg/dL    Physical Findings: AIMS: Facial and Oral Movements Muscles of Facial Expression: None, normal Lips and Perioral Area: None, normal Jaw: None, normal Tongue: None, normal,Extremity Movements Upper (arms, wrists, hands, fingers): None, normal Lower (legs, knees, ankles, toes): None, normal, Trunk Movements Neck, shoulders, hips: None, normal, Overall Severity Severity of abnormal movements (highest score from questions above): None, normal Incapacitation due to abnormal movements: None, normal Patient's awareness of abnormal movements (rate only patient's report): No Awareness, Dental Status Current problems with teeth and/or dentures?: No Does patient usually wear dentures?: No  CIWA:  CIWA-Ar Total: 0 COWS:     Treatment Plan Summary: Daily contact with patient to assess and evaluate symptoms and progress in treatment Medication management  Plan: Supportive approach/coping skills/relapse prevention           Continue to detox           Reassess pain management           Optimize treatment with Depakote  Medical Decision Making Problem Points:  Review of psycho-social stressors (1) Data Points:  Review of medication regiment & side effects (2)  I certify that inpatient services furnished can reasonably be expected to improve the patient's condition.   Chanetta Moosman A 04/13/2013, 4:06 PM

## 2013-04-13 NOTE — Progress Notes (Signed)
Pt is currently endorsing depression with mild anxiety. Pt is denying any withdrawal symptoms at this time. Pt is also denying any SI/HI. With encouragement, pt attended group this evening.  A: Writer administered scheduled and prn medications to pt. Continued support and availability as needed was extended to this pt. Staff continue to monitor pt with q79min checks. Pt encouraged to ask his physician if an alternative pain medication can be ordered for his left sided leg pain. Pt denied any heat packs to aid in his pain with the tylenol.  R: No adverse drug reactions noted. Pt receptive to treatment. Pt remains safe at this time.

## 2013-04-13 NOTE — [facility] Suicide Risk Assessment (Signed)
[facility] INPATIENT:  Family/Significant Other Suicide Prevention Education  Suicide Prevention Education:  Patient Refusal for Family/Significant Other Suicide Prevention Education: The patient Shawn [name] has refused to provide written consent for family/significant other to be provided Family/Significant Other Suicide Prevention Education during admission and/or prior to discharge.  Physician notified.   SPE completed with Chrissie Noa. He was given SPI pamphlet and encouraged to ask questions and talk about any concerns relating to SPE.  Smart, Patsy Varma 04/13/2013, 8:35 AM

## 2013-04-13 NOTE — Progress Notes (Signed)
Recreation Therapy Notes  Date: 08.12.2014 Time: 3:00pm Location: [address] Dayroom  Group Topic: Problem Solving, Decision Making, Communication  Goal Area(s) Addresses:  Patient will effectively work in a team with other group members. Patient will verbalize skills needed to make activity successful.  Patient will verbalize ways to use skills used in group session post d/c.  Behavioral Response: Engaged, Appropriate   Intervention: Survival Scenario  Activity: Life Boat. Patients were instructed that they had charted a yacht for the afternoon with 15 guests of various socioeconomic status and social status (i.e: Materials engineer, Physician, Midwife). Patients were instructed they could save themselves, as well as 8 of the 15 guests, by boarding a life boat, as the yacht starts to sink half way through the trip.  Education: Customer service manager, Pharmacologist, Discharge Planning, Relapse Prevention, Recovery Process  Education Outcome: Acknowledges understanding  Clinical Observations/Feedback: Patient actively engaged in group session. Patient chose appropriate individuals to put on the life boat. Patient communicated opinion well to peer and was abel to debate appropriately for individuals he wanted to put on life boat. Patient identified that he based his decisions off of necessity, as well as emotion. Patient additionally agreed with peer who stated they used survival skills as well. Patient verbalized understanding that skills used to determine who would be put on life boat could be used to build support system post d/c. Patient agreed that a healthy support system is part of the recovery process.   Marykay Lex Braedyn Kauk, LRT/CTRS   Jearl Klinefelter 04/13/2013 4:06 PM

## 2013-04-13 NOTE — Progress Notes (Signed)
Recreation Therapy Notes  Date: 08.12.2014 Time: 2:30pm Location: [address] Dayroom  Group Topic: Animal Assisted Activities  Goal Area(s) Addresses:  Patient will interact appropriately with dog team.    Behavioral Response: Engaged, Appropriate  Intervention: Animal Assisted Therapy Dog Team.   Dog Team: [facility] and handler   Clinical Observations/Feedback: Dog Team: [employer]. Patient interacted appropriately with peer, dog team, LRT and MHT.   Marykay Lex Nabria Nevin, LRT/CTRS  Jearl Klinefelter 04/13/2013 4:51 PM

## 2013-04-13 NOTE — Progress Notes (Addendum)
Rn 1:1 Note 0000  D:Pt in bed having trouble going to sleep. Writer offered to administer any available medications to aid in sleep. Pt was receptive to the offer. At this time pt appears to be in no signs of distress at this time.  A: 1:1 continuous observation remains in effect for this pt.  R: Pt remains safe at this time.

## 2013-04-13 NOTE — Progress Notes (Signed)
Nursing 1:1 note- patient is alert and oriented.  Has attended AA group this evening.  Denies SI.  Requested and received prn sleep meds.  States "this is the first full night I've slept in a long time"  Support offered.  Continue POC and 1:1 for safety.  Remains safe.

## 2013-04-13 NOTE — Progress Notes (Signed)
D: Patient reports he wants to go to recreational activity; patient reports some pain in the hip and knee but does not want anything at this time; patient denies SI/HI and A/V hallucinations  A: CONTINUE 1:1 order; patient encourage to use proper precautions to lower fall risk;   R: Patient is receptive; patient is appropriate; patient is cooperative and pleasant; patient has no acute needs at this time

## 2013-04-13 NOTE — [facility] Group Notes (Signed)
[facility] LCSW Group Therapy  04/13/2013 2:19 PM  Type of Therapy:  Group Therapy  Participation Level:  Active  Participation Quality:  Attentive  Affect:  Appropriate  Cognitive:  Alert and Oriented  Insight:  Engaged  Engagement in Therapy:  Improving  Modes of Intervention:  Discussion, Education, Socialization and Support  Summary of Progress/Problems: MHA Speaker came to talk about his personal journey with substance abuse and addiction. The pt processed ways by which to relate to the speaker. MHA speaker provided handouts and educational information pertaining to groups and services offered by the [facility]. Shawn [name] was attentive and engaged throughout the day's group. He did not ask the speaker questions, but did listen intently as the speaker shared his personal story with MI and SA. Shawn [name] listened and looked through [facility] pamphlets as the speaker reviewed several of the groups offered by this agency. Shawn [name] shows progress in the group setting AEB his ability to remain attentive and engaged throughout the group's entirety.    Shawn [name], Shawn [name] 04/13/2013, 2:19 PM

## 2013-04-13 NOTE — Progress Notes (Signed)
Patient did not attend 0900 hr RN group. 

## 2013-04-14 NOTE — Progress Notes (Signed)
Adult Psychoeducational Group Note  Date:  04/14/2013 Time:  6:27 PM  Group Topic/Focus:  Personal Choices and Values:   The focus of this group is to help patients assess and explore the importance of values in their lives, how their values affect their decisions, how they express their values and what opposes their expression.  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 Pt offered no further verbal contributions to group. Pt participated by listening attentively to discussion on personal values and how to bring one's actions into alignment with his/her values.  Reinaldo Raddle K 04/14/2013, 6:27 PM

## 2013-04-14 NOTE — [facility] Group Notes (Signed)
[facility] LCSW Aftercare Discharge Planning Group Note   04/14/2013 10:11 AM  Participation Quality:  DID NOT ATTEND   Smart, Herbert Seta

## 2013-04-14 NOTE — Progress Notes (Addendum)
RN 1:1 NOTE  D: Patient denies SI/HI and A/V hallucinations; patient reports sleep is well; reports appetite is good; reports energy level is normal ; reports ability to pay attention is good; rates depression as 2/10; rates hopelessness 1/10; rates anxiety as 1/10; patient has no other complaints today  A: Monitored q 15 minutes; patient encouraged to attend groups; patient educated about medications; patient given medications per physician orders; patient encouraged to express feelings and/or concerns; 1:1 observation  R: Patient is appropriate and that he has been in the bed all morning; patient's interaction with staff and peers is isolative and minimal;  patient is taking medications as prescribed and tolerating medications; patient has not attended any morning groups and has been laying in the bed; patient has no acute needs at this time

## 2013-04-14 NOTE — Progress Notes (Addendum)
RN 1:1 NOTE  D: Patient is is quietly resting in his room;    A: Continue 1:1 observation  R: Patient respirations are even and unlabored; patient has no acute needs at this time

## 2013-04-14 NOTE — [facility] Group Notes (Signed)
Adult Psychoeducational Group Note  Date:  04/14/2013 Time:  4:27 AM  Group Topic/Focus:  Wrap-Up Group:   The focus of this group is to help patients review their daily goal of treatment and discuss progress on daily workbooks.  Participation Level:  Minimal  Participation Quality:  Appropriate  Affect:  Appropriate  Cognitive:  Appropriate  Insight: Good  Engagement in Group:  Limited  Modes of Intervention:  Education  Additional Comments:  Patient actively listened to the representatives of NA/AA but did not participate in group discussions.  Delia Chimes 04/14/2013, 4:27 AM

## 2013-04-14 NOTE — Progress Notes (Signed)
Adult Psychoeducational Group Note  Date:  04/14/2013 Time:  8:58 PM  Group Topic/Focus:  NA group  Participation Level:  Active  Participation Quality:  Appropriate  Affect:  Appropriate  Cognitive:  Alert  Insight: Appropriate  Engagement in Group:  Engaged  Modes of Intervention:  Discussion  Additional Comments:    Flonnie Hailstone 04/14/2013, 8:58 PM

## 2013-04-14 NOTE — Progress Notes (Signed)
Nursing 1:1 note D:Pt observed sleeping in bed with eyes closed. RR even and unlabored. No distress noted. A: 1:1 observation continues for safety  R: pt remains safe  

## 2013-04-14 NOTE — Progress Notes (Signed)
Adult Psychoeducational Group Note  Date:  04/14/2013 Time:  11:06 AM  Group Topic/Focus:  Therapeutic Activity  Participation Level:  Did Not Attend  Participation Quality:    Affect:    Cognitive:    Insight:  Engagement in Group:    Modes of Intervention:    Additional Comments:  Pt did not attend group  Shelly Bombard D 04/14/2013, 11:06 AM

## 2013-04-14 NOTE — Progress Notes (Addendum)
Patient ID: Shawn [name], male   DOB: [birth year], 62 y.o.   MRN: [id number]  Nursing 1:1 note   D:Pt observed sleeping in bed with eyes closed. RR even and unlabored. A: 1:1 observation continues for safety  R: pt remains safe

## 2013-04-14 NOTE — Progress Notes (Signed)
Patient ID: Shawn [name], male   DOB: 1951-07-28, 62 y.o.   MRN: [id number] [facility] MD Progress Note  04/14/2013 4:52 PM Shawn [name]  MRN:  [id number]  Subjective:  Shawn [name] endorses that he is on track to resume long term abstinence. He is reporting improved mood today. However, continue to complain of pain to left his hip areas. He has appointment to follow-up with his orthopedic after discharge. He remains on 1:1 for safety. He denies SIHI.  Diagnosis:  Alcohol Dependence, Bipolar Disoder  ADL's:  Intact  Sleep: Fair  Appetite:  Poor  Suicidal Ideation:  Plan:  denies Intent:  denies Means:  denies Homicidal Ideation:  Plan:  denies Intent:  denies Means:  denies AEB (as evidenced by):  Psychiatric Specialty Exam: Review of Systems  Constitutional: Negative.   HENT: Negative.   Eyes: Negative.   Respiratory: Negative.   Cardiovascular: Negative.   Gastrointestinal: Negative.   Genitourinary: Negative.   Musculoskeletal: Positive for back pain and joint pain.  Skin: Negative.   Neurological: Negative.   Endo/Heme/Allergies: Negative.   Psychiatric/Behavioral: Positive for substance abuse. The patient is nervous/anxious.     Blood pressure 95/62, pulse 83, temperature 97.7 F (36.5 C), temperature source Oral, resp. rate 14, height 5\' 11"  (1.803 m), weight 73.936 kg (163 lb).Body mass index is 22.74 kg/(m^2).  General Appearance: Disheveled  Eye Solicitor::  Fair  Speech:  Clear and Coherent and Slow  Volume:  Decreased  Mood:  Anxious and worried  Affect:  Restricted  Thought Process:  Coherent and Goal Directed  Orientation:  Full (Time, Place, and Person)  Thought Content:  somatically focused, ruminative  Suicidal Thoughts:  No  Homicidal Thoughts:  No  Memory:  Immediate;   Fair Recent;   Fair Remote;   Fair  Judgement:  Fair  Insight:  Present  Psychomotor Activity:  Restlessness  Concentration:  Fair  Recall:  Fair  Akathisia:  No  Handed:  Right   AIMS (if indicated):     Assets:  Desire for Improvement  Sleep:  Number of Hours: 6   Current Medications: Current Facility-Administered Medications  Medication Dose Route Frequency Provider Last Rate Last Dose  . acetaminophen (TYLENOL) tablet 650 mg  650 mg Oral Q6H PRN Court Joy, PA-C   650 mg at 04/12/13 2001  . alum & mag hydroxide-simeth (MAALOX/MYLANTA) 200-200-20 MG/5ML suspension 30 mL  30 mL Oral Q4H PRN Court Joy, PA-C      . chlordiazePOXIDE (LIBRIUM) capsule 25 mg  25 mg Oral Q6H PRN Court Joy, PA-C      . chlordiazePOXIDE (LIBRIUM) capsule 25 mg  25 mg Oral Once Court Joy, PA-C      . chlordiazePOXIDE (LIBRIUM) capsule 25 mg  25 mg Oral BH-qamhs Court Joy, PA-C       Followed by  . [START ON 04/16/2013] chlordiazePOXIDE (LIBRIUM) capsule 25 mg  25 mg Oral Daily Court Joy, PA-C      . divalproex (DEPAKOTE ER) 24 hr tablet 500 mg  500 mg Oral Daily Rachael Fee, MD   500 mg at 04/14/13 0758  . gabapentin (NEURONTIN) capsule 100 mg  100 mg Oral TID Sanjuana Kava, NP   100 mg at 04/14/13 1647  . hydrOXYzine (ATARAX/VISTARIL) tablet 25 mg  25 mg Oral Q6H PRN Court Joy, PA-C      . loperamide (IMODIUM) capsule 2-4 mg  2-4 mg Oral PRN Court Joy, PA-C      .  magnesium hydroxide (MILK OF MAGNESIA) suspension 30 mL  30 mL Oral Daily PRN Court Joy, PA-C      . methocarbamol (ROBAXIN) tablet 500 mg  500 mg Oral TID Sanjuana Kava, NP   500 mg at 04/14/13 1647  . multivitamin with minerals tablet 1 tablet  1 tablet Oral Daily Court Joy, PA-C   1 tablet at 04/14/13 2595  . nicotine (NICODERM CQ - dosed in mg/24 hours) patch 21 mg  21 mg Transdermal Daily Court Joy, PA-C   21 mg at 04/14/13 0800  . ondansetron (ZOFRAN-ODT) disintegrating tablet 4 mg  4 mg Oral Q6H PRN Court Joy, PA-C      . thiamine (VITAMIN B-1) tablet 100 mg  100 mg Oral Daily Court Joy, PA-C   100 mg at 04/14/13 6387   Or  . thiamine (B-1)  injection 100 mg  100 mg Intravenous Daily Court Joy, PA-C      . traZODone (DESYREL) tablet 50 mg  50 mg Oral QHS PRN,MR X 1 Court Joy, PA-C   50 mg at 04/13/13 2129    Lab Results:  Results for orders placed during the hospital encounter of 04/12/13 (from the past 48 hour(s))  GLUCOSE, CAPILLARY     Status: Abnormal   Collection Time    04/13/13 11:34 AM      Result Value Range   Glucose-Capillary 104 (*) 70 - 99 mg/dL    Physical Findings: AIMS: Facial and Oral Movements Muscles of Facial Expression: None, normal Lips and Perioral Area: None, normal Jaw: None, normal Tongue: None, normal,Extremity Movements Upper (arms, wrists, hands, fingers): None, normal Lower (legs, knees, ankles, toes): None, normal, Trunk Movements Neck, shoulders, hips: None, normal, Overall Severity Severity of abnormal movements (highest score from questions above): None, normal Incapacitation due to abnormal movements: None, normal Patient's awareness of abnormal movements (rate only patient's report): No Awareness, Dental Status Current problems with teeth and/or dentures?: No Does patient usually wear dentures?: No  CIWA:  CIWA-Ar Total: 0 COWS:     Treatment Plan Summary: Daily contact with patient to assess and evaluate symptoms and progress in treatment Medication management  Plan: Supportive approach/coping skills/relapse prevention Obtainn Depakote levels in am. Reassess pain management Continue current treatment plan  Medical Decision Making Problem Points:  Review of psycho-social stressors (1) Data Points:  Review of medication regiment & side effects (2)  I certify that inpatient services furnished can reasonably be expected to improve the patient's condition.   Armandina Stammer I, PMHNP-BC 04/14/2013, 4:52 PM

## 2013-04-14 NOTE — Progress Notes (Signed)
Patient ID: Shawn [name], male   DOB: 11-28-1950, 62 y.o.   MRN: [id number]  Nursing 1:1 note D:Pt observed  Walking to dayroom to get vitals taken. RR even and unlabored. A: 1:1 observation continues for safety  R: pt remains safe

## 2013-04-14 NOTE — Progress Notes (Signed)
Patient ID: Shawn [name], male   DOB: 25-Jul-1951, 62 y.o.   MRN: [id number] 1:1 notes  04/14/2013 @ 2200  D: Patient in bed with eye open. Pt rated pain as 9 on a 0-10 scale. Pt rated depression as 1 on a 0-10 scale. Pt stated he is eating well and his bipolar is getting better. Pt denies SI/HI/AVH. Pt denies any s/s of withdrawals. No sign of distress noted at this time A: Medication administered as prescribed. 1:1 observation for safety R: Patient is safe. Sitter at bedside. 1:1 continues

## 2013-04-14 NOTE — [facility] Group Notes (Signed)
[facility] LCSW Group Therapy  04/14/2013 3:04 PM  Type of Therapy:  Group Therapy  Participation Level:  Did Not Attend  [name], Shawn Seelig 04/14/2013, 3:04 PM  

## 2013-04-15 DIAGNOSIS — F3130 Bipolar disorder, current episode depressed, mild or moderate severity, unspecified: Secondary | ICD-10-CM

## 2013-04-15 LAB — VALPROIC ACID LEVEL: Valproic Acid Lvl: 16 ug/mL — ABNORMAL LOW (ref 50.0–100.0)

## 2013-04-15 MED ORDER — ACETAMINOPHEN 500 MG PO TABS: 500.0000 mg | ORAL_TABLET | Freq: Four times a day (QID) | ORAL | Status: DC | PRN

## 2013-04-15 MED ORDER — GABAPENTIN 100 MG PO CAPS: 100.0000 mg | ORAL_CAPSULE | Freq: Three times a day (TID) | ORAL | Status: DC

## 2013-04-15 MED ORDER — TRAZODONE HCL 50 MG PO TABS: 50.0000 mg | ORAL_TABLET | Freq: Every evening | ORAL | Status: DC | PRN

## 2013-04-15 MED ORDER — DIVALPROEX SODIUM ER 500 MG PO TB24: 500.0000 mg | ORAL_TABLET | Freq: Every day | ORAL | Status: DC

## 2013-04-15 MED ORDER — MULTI-VITAMIN/MINERALS PO TABS: 1.0000 | ORAL_TABLET | Freq: Every day | ORAL | Status: DC

## 2013-04-15 MED ORDER — METHOCARBAMOL 500 MG PO TABS: 500.0000 mg | ORAL_TABLET | Freq: Three times a day (TID) | ORAL | Status: DC

## 2013-04-15 NOTE — Discharge Summary (Signed)
Physician Discharge Summary Note  Patient:  Shawn [name] is an 62 y.o., male MRN:  [id number] DOB:  [birth year] Patient phone:  [phone] (home)  Patient address:   [address] [place] [address],   Date of Admission:  04/12/2013 Date of Discharge: 04/15/13  Reason for Admission:  Alcohol detox  Discharge Diagnoses: Active Problems:   Alcohol dependence   Bipolar I disorder, most recent episode depressed  Review of Systems  Constitutional: Negative.   HENT: Negative.   Eyes: Negative.   Respiratory: Negative.   Cardiovascular: Negative.   Genitourinary: Negative.   Musculoskeletal: Positive for myalgias, back pain and joint pain.  Skin: Negative.   Neurological: Negative.   Endo/Heme/Allergies: Negative.   Psychiatric/Behavioral: Positive for substance abuse (Alcoholism). Negative for depression, suicidal ideas, hallucinations and memory loss. The patient is nervous/anxious (Stabilized with medication prior to discharge) and has insomnia (Stabilized with medication prior to discharge).    Axis Diagnosis:   AXIS I:  Alcohol dependence, Bipolaar affective disorder, most recent episode, depressed AXIS II:  Deferred AXIS III:   Past Medical History  Diagnosis Date  . Osteoarthritis X years    left knee.  Distant hx (age 3) "dislocated" knee and required surgery, then crush injury to patella required knee cap removal.  . Diverticular disease     "itis" x 2 episodes (last was about 2007)  . Tobacco dependence   . Multiple rib fractures 10/09/10    s/p fall down a ravine--9th and 10th on right, contusion of left 7th and 8th ribs  . Fracture of metatarsal bone(s), closed     3rd, 4th, 5th mid shaft on left foot  . History of substance abuse     cocaine, marijuana, acid, alcohol (in remission since 2010)  . Solar dermatitis     face and ears  . Bipolar disorder     Has had multiple psych hospitalization in the past, most recently at [facility]  01/31/11-02/04/11.  Says meds made him emotionally blunted. (lamictal and wellbutrin).  . Alcoholism     "Dry" since 2002  . Depression    AXIS IV:  other psychosocial or environmental problems and Alcoholism AXIS V:  63  Level of Care:  OP  Hospital Course:  Last year had knee replacement, then usrgery for an hernia, then he fell and broke his hip. He states that after these surgeries, the "bipolar has been out of control." He lives by himself, afraid to be alone. He walked here from [employer] as he was wanting help. He separted from wife in July 2103. I t is comming closer to divorce. States he has been drinking. Drinks beer with a double shot of Vodka, a good 4-5 months ever day. Was off it for 3-4 years. 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 Had been active in [place].   Upon admission into this hospital, and after admission assessment/evaluation coupled with UDS/Toxicology reports, it was determined that Mr. Lightsey will need detoxification treatment protocol to stabilize his systems of alcohol intoxication and to combat the withdrawal symptoms as well.  Mr. Frimpong was then started on Librium detoxification treatment protocol. He was also enrolled in group counseling sessions and activities where he was counseled, taught and learned coping skills that should help him after discharge to cope better and manage his substance abuse issues to sustain a much longer sobriety. He also attended AA/NA meetings being offered and held on this unit. He has some previously  existing and or identifiable medical conditions that required treatment and or monitoring, mostly pain episodes. He reported that he has had 2 separate orthopedic surgeries to his left knee and left hip and had fallen quite numerous times while at his home. He added that he has had chronic pain episodes as a result. Mr. Bourbeau received medication management/monitoring for all those health issues as well. He was on 1:1  supervision for safety most of his hospitalization days here due to impaired gait and balance. He was monitored closely for any potential problems that may arise as a result of and or during detoxification treatment. Patient tolerated his treatment regimen and detoxification treatment protocol without any significant adverse effects and or reactions presented.  Patient attended treatment team meeting this am and met with the treatment team members. His reason for admission, present symptoms, substance abuse issues, response to treatment and discharge plans discussed. Patient endorsed that he is doing well and stable for discharge to pursue the next phase of his substance abuse treatment. It was then agreed upon that he will follow-up care at the [facility] here in [place], [place]. He is instructed to call [facility] to schedule this follow-up appointment after discharge today. He also has appointment to follow-up with primary physician, Dr. Loleta Chance for his orthopedic issues. The addresses, date, times and contact information for this clinic and the treatment center provided for patient in writing.  Besides the treatments received here and scheduled outpatient psychiatric services , patient was also ordered and received Depakote ER 500 mg for mood stabilization, Neurontin 100 mg tid for anxiety/pain control and Trazodone 50 mg Q bedtime for sleep. He was encouraged to continue to attend AA/NA meetings being offered and held within his community. Upon discharge, patient adamantly denies suicidal, homicidal ideations, auditory, visual hallucinations, delusional thoughts and or withdrawal symptoms. Patient left [facility] with all personal belongings in no apparent distress. He received 4 days worth supply samples of his [facility] discharge medications provided by [facility] pharmacy. Transportation per taxi cab. Taxi fare provided by [facility].   Consults:  psychiatry  Significant Diagnostic Studies:  labs: CBC with diff,  CMP, UDS, Toxicology tests, U/A, Depakote levels  Discharge Vitals:   Blood pressure 107/69, pulse 61, temperature 97.9 F (36.6 C), temperature source Oral, resp. rate 14, height 5\' 11"  (1.803 m), weight 73.936 kg (163 lb). Body mass index is 22.74 kg/(m^2). Lab Results:   Results for orders placed during the hospital encounter of 04/12/13 (from the past 72 hour(s))  GLUCOSE, CAPILLARY     Status: Abnormal   Collection Time    04/13/13 11:34 AM      Result Value Range   Glucose-Capillary 104 (*) 70 - 99 mg/dL  VALPROIC ACID LEVEL     Status: Abnormal   Collection Time    04/15/13  6:25 AM      Result Value Range   Valproic Acid Lvl 16.0 (*) 50.0 - 100.0 ug/mL   Comment: Performed at [facility]    Physical Findings: AIMS: Facial and Oral Movements Muscles of Facial Expression: None, normal Lips and Perioral Area: None, normal Jaw: None, normal Tongue: None, normal,Extremity Movements Upper (arms, wrists, hands, fingers): None, normal Lower (legs, knees, ankles, toes): None, normal, Trunk Movements Neck, shoulders, hips: None, normal, Overall Severity Severity of abnormal movements (highest score from questions above): None, normal Incapacitation due to abnormal movements: None, normal Patient's awareness of abnormal movements (rate only patient's report): No Awareness, Dental Status Current  problems with teeth and/or dentures?: No Does patient usually wear dentures?: No  CIWA:  CIWA-Ar Total: 0 COWS:     Psychiatric Specialty Exam: See Psychiatric Specialty Exam and Suicide Risk Assessment completed by Attending Physician prior to discharge.  Discharge destination:  Home  Is patient on multiple antipsychotic therapies at discharge:  No   Has Patient had three or more failed trials of antipsychotic monotherapy by history:  No  Recommended Plan for Multiple Antipsychotic Therapies: NA       Future Appointments Provider Department Dept Phone   04/21/2013 5:00  PM Mariella Saa, MD [facility], [place] [phone]       Medication List       Indication   acetaminophen 500 MG tablet  Commonly known as:  TYLENOL  Take 1 tablet (500 mg total) by mouth every 6 (six) hours as needed for pain or fever.   Indication:  Pain     divalproex 500 MG 24 hr tablet  Commonly known as:  DEPAKOTE ER  Take 1 tablet (500 mg total) by mouth daily. For stabilization   Indication:  Mood stabilization     gabapentin 100 MG capsule  Commonly known as:  NEURONTIN  Take 1 capsule (100 mg total) by mouth 3 (three) times daily. For anxiety/paim management   Indication:  Pain, Anxiety     methocarbamol 500 MG tablet  Commonly known as:  ROBAXIN  Take 1 tablet (500 mg total) by mouth 3 (three) times daily. For muscle pian   Indication:  Musculoskeletal Pain     multivitamin with minerals tablet  Take 1 tablet by mouth daily. For low vitamin   Indication:  Vitamin supplement     traZODone 50 MG tablet  Commonly known as:  DESYREL  Take 1 tablet (50 mg total) by mouth at bedtime as needed and may repeat dose one time if needed for sleep.   Indication:  Trouble Sleeping       Follow-up Information   Follow up with [facility]. (Call [phone] on day of discharge to schedule assessment and transportation. )    Contact information:   [address] [place], [place] [address] Phone: [phone] Fax: [phone]      Follow up with [facility] On 04/19/2013. (Appt at 11AM with Dr. Loleta Chance for hospital followup/medication management.)    Contact information:   [address] [place], [place] [address] phone: [phone] fax: [phone]     Follow-up recommendations:  Activity:  As tolerated Diet: As recommended by your primary care doctor. Keep all scheduled follow-up appointments as recommended.  Comments:  Take all your medications as prescribed by your mental healthcare provider. Report any adverse effects and  or reactions from your medicines to your outpatient provider promptly. Patient is instructed and cautioned to not engage in alcohol and or illegal drug use while on prescription medicines. In the event of worsening symptoms, patient is instructed to call the crisis hotline, 911 and or go to the nearest ED for appropriate evaluation and treatment of symptoms. Follow-up with your primary care provider for your other medical issues, concerns and or health care needs.    Total Discharge Time:  Greater than 30 minutes.  SignedSanjuana Kava, PMHNP-BC 04/16/2013, 10:48 AM  Patient was evaluated, developed treatment and case discussed with treatment team. Reviewed the information documented and agree with the treatment plan.   Jessey Huyett,JANARDHAHA R. 04/16/2013 8:59 PM

## 2013-04-15 NOTE — Progress Notes (Signed)
Patient ID: Shawn [name], male   DOB: 1950/11/07, 62 y.o.   MRN: [id number] He has been discharged home and left via cab. He voiced understanding of discharge teaching about medications and of the follow up plan. He denies though to harm self.   All belongings taken home with him. He said that he was going home and call for a appointment as instructed. Stated that his rent and [place] payment had been made.

## 2013-04-15 NOTE — Progress Notes (Signed)
Patient ID: Shawn [name], male   DOB: 15-May-1951, 62 y.o.   MRN: [id number] See one to one note,s.

## 2013-04-15 NOTE — [facility] Suicide Risk Assessment (Signed)
Suicide Risk Assessment  Discharge Assessment     Demographic Factors:  Male, Adolescent or young adult, Caucasian, Low socioeconomic status and Unemployed  Mental Status Per Nursing Assessment::   On Admission:  NA  Current Mental Status by Physician: Patient appeared calm and cooperative and laying in his bed with no distress. Patient has completed his in alcohol detox treatment and feels he is ready to go home. Patient has mild psychomotor retardation. He has a normal speech but low volume he has a linear and goal-directed thought process. He has noticed is that of onset ideation intentions or plans. He has no evidence of psychotic symptoms.   Loss Factors: Decline in physical health and Financial problems/change in socioeconomic status  Historical Factors: Family history of suicide, Family history of mental illness or substance abuse and Impulsivity  Risk Reduction Factors:   Sense of responsibility to family, Religious beliefs about death, Living with another person, especially a relative, Positive social support, Positive therapeutic relationship and Positive coping skills or problem solving skills  Continued Clinical Symptoms:  Bipolar Disorder:   Depressive phase Depression:   Comorbid alcohol abuse/dependence Recent sense of peace/wellbeing Alcohol/Substance Abuse/Dependencies Previous Psychiatric Diagnoses and Treatments Medical Diagnoses and Treatments/Surgeries  Cognitive Features That Contribute To Risk:  Polarized thinking    Suicide Risk:  Minimal: No identifiable suicidal ideation.  Patients presenting with no risk factors but with morbid ruminations; may be classified as minimal risk based on the severity of the depressive symptoms  Discharge Diagnoses:   AXIS I:  Bipolar, Depressed and Alcohol dependence AXIS II:  Deferred AXIS III:   Past Medical History  Diagnosis Date  . Osteoarthritis X years    left knee.  Distant hx (age 69) "dislocated" knee and  required surgery, then crush injury to patella required knee cap removal.  . Diverticular disease     "itis" x 2 episodes (last was about 2007)  . Tobacco dependence   . Multiple rib fractures 10/09/10    s/p fall down a ravine--9th and 10th on right, contusion of left 7th and 8th ribs  . Fracture of metatarsal bone(s), closed     3rd, 4th, 5th mid shaft on left foot  . History of substance abuse     cocaine, marijuana, acid, alcohol (in remission since 2010)  . Solar dermatitis     face and ears  . Bipolar disorder     Has had multiple psych hospitalization in the past, most recently at [facility] 01/31/11-02/04/11.  Says meds made him emotionally blunted. (lamictal and wellbutrin).  . Alcoholism     "Dry" since 2002  . Depression    AXIS IV:  economic problems, occupational problems, other psychosocial or environmental problems and problems related to social environment AXIS V:  51-60 moderate symptoms  Plan Of Care/Follow-up recommendations:  Activity:  As tolerated Diet:  Regular  Is patient on multiple antipsychotic therapies at discharge:  No   Has Patient had three or more failed trials of antipsychotic monotherapy by history:  No  Recommended Plan for Multiple Antipsychotic Therapies: Not applicable  Nehemiah Settle., M.D. 04/15/2013, 11:27 AM

## 2013-04-15 NOTE — Progress Notes (Signed)
[facility] Adult Case Management Discharge Plan :  Will you be returning to the same living situation after discharge: Yes,  home At discharge, do you have transportation home?:Yes,  cab voucher Do you have the ability to pay for your medications:Yes,  medicaid  Release of information consent forms completed and in the chart;  Patient's signature needed at discharge.  Patient to Follow up at: Follow-up Information   Follow up with [facility]. (Call [phone] on day of discharge to schedule assessment and transportation. )    Contact information:   [address] [place], [place] [address] Phone: [phone] Fax: [phone]      Follow up with [facility] On 04/19/2013. (Appt at 11AM with Dr. Loleta Chance for hospital followup/medication management.)    Contact information:   [address] [place], [place] [address] phone: [phone] fax: [phone]      Patient denies SI/HI:   Yes,  during group/self report    Safety Planning and Suicide Prevention discussed:  Yes,  SPE completed with pt. Pt provided with SPI pamphlet and encouraged to ask questions and talk about concerns relating to SPE.  Smart, Shawn [name] 04/15/2013, 10:23 AM

## 2013-04-15 NOTE — Progress Notes (Signed)
Adult Psychoeducational Group Note  Date:  04/15/2013 Time:  1:11 PM  Group Topic/Focus:  Rediscovering Joy:   The focus of this group is to explore various ways to relieve stress in a positive manner.  Participation Level:  Did Not Attend   [name] 04/15/2013, 1:11 PM

## 2013-04-16 MED ORDER — ACETAMINOPHEN 500 MG PO TABS: 500.0000 mg | ORAL_TABLET | Freq: Four times a day (QID) | ORAL | Status: DC | PRN

## 2013-04-20 NOTE — Progress Notes (Signed)
Patient Discharge Instructions:  After Visit Summary (AVS):   Faxed to:  04/20/13 Discharge Summary Note:   Faxed to:  04/20/13 Psychiatric Admission Assessment Note:   Faxed to:  04/20/13 Suicide Risk Assessment - Discharge Assessment:   Faxed to:  04/20/13 Faxed/Sent to the Next Level Care provider:  04/20/13 Faxed to [facility] @ [phone] Faxed to [facility] @ [phone]  Jerelene Redden, 04/20/2013, 3:07 PM

## 2013-04-21 ENCOUNTER — Ambulatory Visit (INDEPENDENT_AMBULATORY_CARE_PROVIDER_SITE_OTHER): Payer: Medicaid Other | Admitting: General Surgery

## 2013-04-21 ENCOUNTER — Encounter (INDEPENDENT_AMBULATORY_CARE_PROVIDER_SITE_OTHER): Payer: Self-pay | Admitting: General Surgery

## 2013-04-21 VITALS — BP 136/84 | HR 74 | Temp 98.8°F | Resp 18 | Ht 69.0 in | Wt 164.8 lb

## 2013-04-21 DIAGNOSIS — K432 Incisional hernia without obstruction or gangrene: Secondary | ICD-10-CM

## 2013-04-21 NOTE — Progress Notes (Signed)
Chief complaint: Incisional hernia, fecal incontinence  History: Patient returns to the office for more long-term followup. He has a history of an incarcerated right inguinal hernia in October of 2013 when he presented with small bowel obstruction and a gangrenous loop of small intestine. He underwent repair of his right inguinal hernia as well as laparotomy and small bowel resection. He had significant wound infections postoperatively requiring chronic wound care. These eventually healed but he has been left with a low midline ventral incisional hernia. He comes in for planned followup for this. He states it is bigger. It he gives him occasional mild discomfort. His chief complaint however is incontinence of bowel movements. He states this has been going on for about 4 months. He states he will have very little warning and severe urgency and then is very often incontinent before he can get to the bathroom. This is obviously very distressing for him. The bowel movements are usually loose or towards diarrhea but occasionally solid as well. No blood. He has not had a colonoscopy. He states he has had some tremors in his hands recently as well. The patient has significant psychiatric history with bipolar disease and was hospitalized at behavioral health about 2 weeks ago. He has a history of alcohol abuse and states he is drinking a little bit but denies any heavy drinking or other drug use.  Past Medical History  Diagnosis Date  . Osteoarthritis X years    left knee.  Distant hx (age 49) "dislocated" knee and required surgery, then crush injury to patella required knee cap removal.  . Diverticular disease     "itis" x 2 episodes (last was about 2007)  . Tobacco dependence   . Multiple rib fractures 10/09/10    s/p fall down a ravine--9th and 10th on right, contusion of left 7th and 8th ribs  . Fracture of metatarsal bone(s), closed     3rd, 4th, 5th mid shaft on left foot  . History of substance abuse      cocaine, marijuana, acid, alcohol (in remission since 2010)  . Solar dermatitis     face and ears  . Bipolar disorder     Has had multiple psych hospitalization in the past, most recently at [facility] 01/31/11-02/04/11.  Says meds made him emotionally blunted. (lamictal and wellbutrin).  . Alcoholism     "Dry" since 2002  . Depression    Past Surgical History  Procedure Laterality Date  . Knee dislocation surgery  1966  . Patellectomy  1968    s/p crush injury  . Inguinal hernia repair      left  . Hernia repair      at 62 years of age  33  . Total knee arthroplasty  11/20/2011    Procedure: TOTAL KNEE ARTHROPLASTY;  Surgeon: Loreta Ave, MD;  Location: [facility] OR;  Service: Orthopedics;  Laterality: Left;  DR Eulah Pont WANTS FOR THIS CASE  . Inguinal hernia repair  06/15/2012    Procedure: HERNIA REPAIR INGUINAL INCARCERATED;  Surgeon: Mariella Saa, MD;  Location: [facility] ORS;  Service: General;  Laterality: Right;  . Bowel resection  06/15/2012    Procedure: SMALL BOWEL RESECTION;  Surgeon: Mariella Saa, MD;  Location: [facility] ORS;  Service: General;  Laterality: N/A;  . Femur im nail  08/06/2012    Procedure: INTRAMEDULLARY (IM) NAIL FEMORAL;  Surgeon: Javier Docker, MD;  Location: [facility] ORS;  Service: Orthopedics;  Laterality: Left;   Current Outpatient  Prescriptions  Medication Sig Dispense Refill  . acetaminophen (TYLENOL) 500 MG tablet Take 1 tablet (500 mg total) by mouth every 6 (six) hours as needed for pain or fever.  30 tablet  0  . divalproex (DEPAKOTE ER) 500 MG 24 hr tablet Take 1 tablet (500 mg total) by mouth daily. For stabilization  30 tablet  0  . gabapentin (NEURONTIN) 100 MG capsule Take 1 capsule (100 mg total) by mouth 3 (three) times daily. For anxiety/paim management  90 capsule  0  . methocarbamol (ROBAXIN) 500 MG tablet Take 1 tablet (500 mg total) by mouth 3 (three) times daily. For muscle pian  90 tablet  0  . Multiple  Vitamins-Minerals (MULTIVITAMIN WITH MINERALS) tablet Take 1 tablet by mouth daily. For low vitamin      . traZODone (DESYREL) 50 MG tablet Take 1 tablet (50 mg total) by mouth at bedtime as needed and may repeat dose one time if needed for sleep.  60 tablet  0   No current facility-administered medications for this visit.   Allergies  Allergen Reactions  . Codeine Nausea Only   History  Substance Use Topics  . Smoking status: Current Every Day Smoker -- 1.00 packs/day for 30 years    Types: Cigarettes  . Smokeless tobacco: Never Used  . Alcohol Use: Yes     Comment: States one or 2 beers occasionally   Review of systems General: No fever or chills Lungs: No shortness of breath or cough Cardiac: No chest pain or palpitations or edema Abdomen: As above GU: He is urinary urgency and occasional urinary incontinence as well Neurologic: No distress in his hands. Psychologic: Continues to battle depression and anxiety  Exam: BP 136/84  Pulse 74  Temp(Src) 98.8 F (37.1 C)  Resp 18  Ht 5\' 9"  (1.753 m)  Wt 164 lb 12.8 oz (74.753 kg)  BMI 24.33 kg/m2 General: Caucasian male appearing somewhat older than his stated age and somewhat chronically ill. Skin: No rash or infection HEENT: No palpable mass or thyromegaly. Lymph nodes: No cervical, subclavicular and monos palpable Lungs: Clear breath sounds without increased work of breathing Cardiac: Regular rate and rhythm. No murmurs or edema. Abdomen: Healed right inguinal and low midline incisions. There is a fairly large diffuse low midline incisional hernia that is soft and nontender and easily reducible. No masses or organomegaly. Rectal: Tone appears normal. No tenderness or masses or blood. Extremities: No swelling Neurologic: He is alert and fully oriented. Gait is slightly shuffling. Mild hand tremors.  Assessment and plan: History of laparotomy and small bowel resection for incarcerated inguinal hernia with small bowel  necrosis. He had a wound infection and has an enlarging low midline hernia. The problem of most concern to him however is the recent onset of what sounds like significant fecal incontinence.  I discussed with him that his incisional hernia is not responsible for his incontinence. He appears to have normal tone on exam. The source of this is not clear to me. He has never had a colonoscopy and I think this would be a good starting point. I will talk to GI about a referral for this. If this is negative I would like Dr. Maisie Fus, her colorectal surgeon to evaluate him. I would not plan to repair his hernia until this is sorted out although I think he does need his hernia repaired. Ongoing cigarette use he has a problem and he is drinking at least a little bit again. I counseled him  that these were issues working again successful surgery. I'll plan to see him back after we arrange his colonoscopy.

## 2013-04-21 NOTE — Patient Instructions (Addendum)
We will contact you regarding a GI referral for colonoscopy. If you did not hear from me within one week please call to check on this.

## 2013-04-26 ENCOUNTER — Other Ambulatory Visit (INDEPENDENT_AMBULATORY_CARE_PROVIDER_SITE_OTHER): Payer: Self-pay

## 2013-04-26 DIAGNOSIS — R159 Full incontinence of feces: Secondary | ICD-10-CM

## 2013-04-28 ENCOUNTER — Encounter (HOSPITAL_COMMUNITY): Payer: Self-pay | Admitting: *Deleted

## 2013-04-28 ENCOUNTER — Emergency Department (HOSPITAL_COMMUNITY)
Admission: EM | Admit: 2013-04-28 | Discharge: 2013-04-29 | Disposition: A | Discharge status: Home / Self Care | Payer: Medicaid Other | Attending: Emergency Medicine | Admitting: Emergency Medicine

## 2013-04-28 DIAGNOSIS — Z96659 Presence of unspecified artificial knee joint: Secondary | ICD-10-CM | POA: Insufficient documentation

## 2013-04-28 DIAGNOSIS — Z8719 Personal history of other diseases of the digestive system: Secondary | ICD-10-CM | POA: Insufficient documentation

## 2013-04-28 DIAGNOSIS — Z0000 Encounter for general adult medical examination without abnormal findings: Secondary | ICD-10-CM

## 2013-04-28 DIAGNOSIS — F319 Bipolar disorder, unspecified: Secondary | ICD-10-CM | POA: Insufficient documentation

## 2013-04-28 DIAGNOSIS — F1021 Alcohol dependence, in remission: Secondary | ICD-10-CM | POA: Insufficient documentation

## 2013-04-28 DIAGNOSIS — F17200 Nicotine dependence, unspecified, uncomplicated: Secondary | ICD-10-CM | POA: Insufficient documentation

## 2013-04-28 DIAGNOSIS — Z79899 Other long term (current) drug therapy: Secondary | ICD-10-CM | POA: Insufficient documentation

## 2013-04-28 DIAGNOSIS — R45851 Suicidal ideations: Secondary | ICD-10-CM | POA: Insufficient documentation

## 2013-04-28 DIAGNOSIS — M1990 Unspecified osteoarthritis, unspecified site: Secondary | ICD-10-CM | POA: Insufficient documentation

## 2013-04-28 DIAGNOSIS — Z8781 Personal history of (healed) traumatic fracture: Secondary | ICD-10-CM | POA: Insufficient documentation

## 2013-04-28 DIAGNOSIS — Z046 Encounter for general psychiatric examination, requested by authority: Secondary | ICD-10-CM | POA: Insufficient documentation

## 2013-04-28 DIAGNOSIS — Z872 Personal history of diseases of the skin and subcutaneous tissue: Secondary | ICD-10-CM | POA: Insufficient documentation

## 2013-04-28 LAB — CBC WITH DIFFERENTIAL/PLATELET
Eosinophils Absolute: 0.1 10*3/uL (ref 0.0–0.7)
Eosinophils Relative: 2 % (ref 0–5)
Hemoglobin: 16.2 g/dL (ref 13.0–17.0)
Lymphocytes Relative: 20 % (ref 12–46)
Lymphs Abs: 0.9 10*3/uL (ref 0.7–4.0)
MCH: 32.8 pg (ref 26.0–34.0)
MCV: 95.1 fL (ref 78.0–100.0)
Monocytes Relative: 8 % (ref 3–12)
Platelets: 257 10*3/uL (ref 150–400)
RBC: 4.94 MIL/uL (ref 4.22–5.81)
WBC: 4.7 10*3/uL (ref 4.0–10.5)

## 2013-04-28 LAB — BASIC METABOLIC PANEL
BUN: 12 mg/dL (ref 6–23)
CO2: 25 mEq/L (ref 19–32)
Calcium: 9.3 mg/dL (ref 8.4–10.5)
GFR calc non Af Amer: >90 mL/min (ref >90)
Glucose, Bld: 83 mg/dL (ref 70–99)
Potassium: 3.9 mEq/L (ref 3.5–5.1)
Sodium: 138 mEq/L (ref 135–145)

## 2013-04-28 LAB — URINALYSIS, ROUTINE W REFLEX MICROSCOPIC
Glucose, UA: NEGATIVE mg/dL
Hgb urine dipstick: NEGATIVE
Specific Gravity, Urine: 1.025 (ref 1.005–1.030)

## 2013-04-28 LAB — ETHANOL: Alcohol, Ethyl (B): <11 mg/dL (ref 0–11)

## 2013-04-28 LAB — RAPID URINE DRUG SCREEN, HOSP PERFORMED
Amphetamines: NOT DETECTED
Opiates: NOT DETECTED

## 2013-04-28 NOTE — ED Notes (Signed)
Per Amy in lab, Valporic Acid level should result within 30 minutes.

## 2013-04-28 NOTE — ED Notes (Signed)
Bed: [facility] Expected date:  Expected time:  Means of arrival:  Comments: Pt from [facility]

## 2013-04-28 NOTE — ED Provider Notes (Signed)
This chart was scribed for Earley Favor NP, a non-physician practitioner working with Provider Default, MD by Lewanda Rife, ED Scribe. This patient was seen in room WTR4/WLPT4 and the patient's care was started at 2218.    CSN: [id number]     Arrival date & time 04/28/13  2143 History   First MD Initiated Contact with Patient 04/28/13 2206     Chief Complaint  Patient presents with  . Medical Clearance   (Consider location/radiation/quality/duration/timing/severity/associated sxs/prior Treatment) The history is provided by the patient and medical records.   HPI Comments: Shawn [name] is a 62 y.o. male who presents to the Emergency Department complaining of worsening severe depression onset "quite a while." Reports his litany of medical issues are exacerbating his depression. Reports he lives alone and finds it difficulty to leave and get out of the house. Reports he was IVC by his physician Dr. Eulah Pont because he was have suicidal thoughts and feeling hopeless. Denies known plan for suicide. Denies HI. Denies auditory and visual hallucinations. Reports he was recently d/c from mental health 04/15/13 and denies any improvement since medications were altered.  Past Medical History  Diagnosis Date  . Osteoarthritis X years    left knee.  Distant hx (age 33) "dislocated" knee and required surgery, then crush injury to patella required knee cap removal.  . Diverticular disease     "itis" x 2 episodes (last was about 2007)  . Tobacco dependence   . Multiple rib fractures 10/09/10    s/p fall down a ravine--9th and 10th on right, contusion of left 7th and 8th ribs  . Fracture of metatarsal bone(s), closed     3rd, 4th, 5th mid shaft on left foot  . History of substance abuse     cocaine, marijuana, acid, alcohol (in remission since 2010)  . Solar dermatitis     face and ears  . Bipolar disorder     Has had multiple psych hospitalization in the past, most recently at [facility]  01/31/11-02/04/11.  Says meds made him emotionally blunted. (lamictal and wellbutrin).  . Alcoholism     "Dry" since 2002  . Depression    Past Surgical History  Procedure Laterality Date  . Knee dislocation surgery  1966  . Patellectomy  1968    s/p crush injury  . Inguinal hernia repair      left  . Hernia repair      at 62 years of age  17  . Total knee arthroplasty  11/20/2011    Procedure: TOTAL KNEE ARTHROPLASTY;  Surgeon: Loreta Ave, MD;  Location: [facility] OR;  Service: Orthopedics;  Laterality: Left;  DR Eulah Pont WANTS FOR THIS CASE  . Inguinal hernia repair  06/15/2012    Procedure: HERNIA REPAIR INGUINAL INCARCERATED;  Surgeon: Mariella Saa, MD;  [facility];  Service: General;  Laterality: Right;  . Bowel resection  06/15/2012    Procedure: SMALL BOWEL RESECTION;  Surgeon: Mariella Saa, MD;  [facility];  Service: General;  Laterality: N/A;  . Femur im nail  08/06/2012    Procedure: INTRAMEDULLARY (IM) NAIL FEMORAL;  Surgeon: Javier Docker, MD;  [facility];  Service: Orthopedics;  Laterality: Left;   Family History  Problem Relation Age of Onset  . Arthritis Mother   . Arthritis Father   . Cancer Brother     lung cancer.  Died in early 33s.   History  Substance Use Topics  . Smoking status:  Current Every Day Smoker -- 1.00 packs/day for 30 years    Types: Cigarettes  . Smokeless tobacco: Never Used  . Alcohol Use: Yes     Comment: daily 6 beers and 1 pint of vodka    Review of Systems  Psychiatric/Behavioral: Positive for suicidal ideas.   A complete 10 system review of systems was obtained and all systems are negative except as noted in the HPI and PMH.    Allergies  Codeine  Home Medications   Current Outpatient Rx  Name  Route  Sig  Dispense  Refill  . divalproex (DEPAKOTE ER) 500 MG 24 hr tablet   Oral   Take 1 tablet (500 mg total) by mouth daily. For stabilization   30 tablet   0   . gabapentin (NEURONTIN)  100 MG capsule   Oral   Take 1 capsule (100 mg total) by mouth 3 (three) times daily. For anxiety/paim management   90 capsule   0   . methocarbamol (ROBAXIN) 500 MG tablet   Oral   Take 1 tablet (500 mg total) by mouth 3 (three) times daily. For muscle pian   90 tablet   0   . Multiple Vitamins-Minerals (MULTIVITAMIN WITH MINERALS) tablet   Oral   Take 1 tablet by mouth daily. For low vitamin         . traZODone (DESYREL) 50 MG tablet   Oral   Take 1 tablet (50 mg total) by mouth at bedtime as needed and may repeat dose one time if needed for sleep.   60 tablet   0   . acetaminophen (TYLENOL) 500 MG tablet   Oral   Take 1 tablet (500 mg total) by mouth every 6 (six) hours as needed for pain or fever.   30 tablet   0    BP 154/70  Pulse 63  Temp(Src) 97.5 F (36.4 C) (Oral)  Resp 18  SpO2 98% Physical Exam  Nursing note and vitals reviewed. Constitutional: He is oriented to person, place, and time. He appears well-developed and well-nourished. No distress.  HENT:  Head: Normocephalic and atraumatic.  Eyes: EOM are normal.  Neck: Neck supple. No tracheal deviation present.  Cardiovascular: Normal rate.   Pulmonary/Chest: Effort normal. No respiratory distress.  Musculoskeletal: Normal range of motion.  Neurological: He is alert and oriented to person, place, and time.  Skin: Skin is warm and dry.  Psychiatric: He has a normal mood and affect. His speech is normal and behavior is normal. Thought content normal.    ED Course  Procedures (including critical care time) Medications - No data to display  Labs Review Labs Reviewed  URINE RAPID DRUG SCREEN (HOSP PERFORMED) - Abnormal; Notable for the following:    Benzodiazepines POSITIVE (*)    All other components within normal limits  URINALYSIS, ROUTINE W REFLEX MICROSCOPIC - Abnormal; Notable for the following:    APPearance CLOUDY (*)    Bilirubin Urine SMALL (*)    Ketones, ur 15 (*)    All other  components within normal limits  VALPROIC ACID LEVEL - Abnormal; Notable for the following:    Valproic Acid Lvl <10.0 (*)    All other components within normal limits  ETHANOL  CBC WITH DIFFERENTIAL  BASIC METABOLIC PANEL   Imaging Review No results found.  MDM  Patient is to be medically cleared.  He is been sent from [facility] where he has already been evaluated  I personally performed the services described in  this documentation, which was scribed in my presence. The recorded information has been reviewed and is accurate.   Arman Filter, NP 04/29/13 6962  Arman Filter, NP 04/29/13 0040

## 2013-04-28 NOTE — ED Notes (Signed)
Called lab re Valporic Acid level. Amy will return call

## 2013-04-28 NOTE — ED Notes (Signed)
Pt brought by security from [facility]; pt physician Dr August Saucer took out IVC; states pt bipolar with severe depression; having suicidal thoughts and feeling hopeless

## 2013-04-29 LAB — VALPROIC ACID LEVEL: Valproic Acid Lvl: <10 ug/mL — ABNORMAL LOW (ref 50.0–100.0)

## 2013-04-30 NOTE — ED Provider Notes (Signed)
Medical screening examination/treatment/procedure(s) were performed by non-physician practitioner and as supervising physician I was immediately available for consultation/collaboration.    Gilda Crease, MD 04/30/13 [phone]

## 2013-05-01 ENCOUNTER — Encounter (HOSPITAL_COMMUNITY): Payer: Self-pay | Admitting: *Deleted

## 2013-05-01 ENCOUNTER — Emergency Department (HOSPITAL_COMMUNITY): Payer: Medicaid Other

## 2013-05-01 ENCOUNTER — Emergency Department (HOSPITAL_COMMUNITY)
Admission: EM | Admit: 2013-05-01 | Discharge: 2013-05-01 | Disposition: A | Discharge status: Home / Self Care | Payer: Medicaid Other | Attending: Emergency Medicine | Admitting: Emergency Medicine

## 2013-05-01 DIAGNOSIS — M25449 Effusion, unspecified hand: Secondary | ICD-10-CM | POA: Insufficient documentation

## 2013-05-01 DIAGNOSIS — Z79899 Other long term (current) drug therapy: Secondary | ICD-10-CM | POA: Insufficient documentation

## 2013-05-01 DIAGNOSIS — Z872 Personal history of diseases of the skin and subcutaneous tissue: Secondary | ICD-10-CM | POA: Insufficient documentation

## 2013-05-01 DIAGNOSIS — F319 Bipolar disorder, unspecified: Secondary | ICD-10-CM | POA: Insufficient documentation

## 2013-05-01 DIAGNOSIS — Z8739 Personal history of other diseases of the musculoskeletal system and connective tissue: Secondary | ICD-10-CM | POA: Insufficient documentation

## 2013-05-01 DIAGNOSIS — F17200 Nicotine dependence, unspecified, uncomplicated: Secondary | ICD-10-CM | POA: Insufficient documentation

## 2013-05-01 DIAGNOSIS — M79644 Pain in right finger(s): Secondary | ICD-10-CM

## 2013-05-01 DIAGNOSIS — M79609 Pain in unspecified limb: Secondary | ICD-10-CM | POA: Insufficient documentation

## 2013-05-01 DIAGNOSIS — Z8781 Personal history of (healed) traumatic fracture: Secondary | ICD-10-CM | POA: Insufficient documentation

## 2013-05-01 LAB — BASIC METABOLIC PANEL
BUN: 10 mg/dL (ref 6–23)
CO2: 27 mEq/L (ref 19–32)
Chloride: 103 mEq/L (ref 96–112)
Creatinine, Ser: 0.85 mg/dL (ref 0.50–1.35)
Glucose, Bld: 85 mg/dL (ref 70–99)

## 2013-05-01 LAB — URINALYSIS, ROUTINE W REFLEX MICROSCOPIC
Glucose, UA: NEGATIVE mg/dL
Hgb urine dipstick: NEGATIVE
Ketones, ur: NEGATIVE mg/dL
Leukocytes, UA: NEGATIVE
pH: 6.5 (ref 5.0–8.0)

## 2013-05-01 LAB — CBC WITH DIFFERENTIAL/PLATELET
Basophils Absolute: 0 10*3/uL (ref 0.0–0.1)
Eosinophils Relative: 2 % (ref 0–5)
HCT: 45.3 % (ref 39.0–52.0)
Hemoglobin: 15.9 g/dL (ref 13.0–17.0)
Lymphocytes Relative: 17 % (ref 12–46)
Lymphs Abs: 0.8 10*3/uL (ref 0.7–4.0)
MCV: 94.2 fL (ref 78.0–100.0)
Monocytes Absolute: 0.5 10*3/uL (ref 0.1–1.0)
Monocytes Relative: 11 % (ref 3–12)
Neutro Abs: 3.6 10*3/uL (ref 1.7–7.7)
RBC: 4.81 MIL/uL (ref 4.22–5.81)
WBC: 5.1 10*3/uL (ref 4.0–10.5)

## 2013-05-01 LAB — SEDIMENTATION RATE: Sed Rate: 5 mm/hr (ref 0–16)

## 2013-05-01 MED ORDER — SODIUM CHLORIDE 0.9 % IV BOLUS (SEPSIS)
500.0000 mL | Freq: Once | INTRAVENOUS | Status: AC
  Administered 2013-05-01: 500 mL via INTRAVENOUS

## 2013-05-01 MED ORDER — IBUPROFEN 800 MG PO TABS
800.0000 mg | ORAL_TABLET | Freq: Once | ORAL | Status: AC
  Administered 2013-05-01: 800 mg via ORAL
  Filled 2013-05-01: qty 1

## 2013-05-01 MED ORDER — DEXAMETHASONE SODIUM PHOSPHATE 10 MG/ML IJ SOLN
10.0000 mg | Freq: Once | INTRAMUSCULAR | Status: AC
  Administered 2013-05-01: 10 mg via INTRAVENOUS
  Filled 2013-05-01: qty 1

## 2013-05-01 MED ORDER — CLINDAMYCIN PHOSPHATE 600 MG/50ML IV SOLN
600.0000 mg | Freq: Once | INTRAVENOUS | Status: AC
  Administered 2013-05-01: 600 mg via INTRAVENOUS
  Filled 2013-05-01: qty 50

## 2013-05-01 MED ORDER — IBUPROFEN 800 MG PO TABS: 800.0000 mg | ORAL_TABLET | Freq: Three times a day (TID) | ORAL | Status: DC | PRN

## 2013-05-01 MED ORDER — CLINDAMYCIN HCL 150 MG PO CAPS: 300.0000 mg | ORAL_CAPSULE | Freq: Three times a day (TID) | ORAL | Status: DC

## 2013-05-01 NOTE — ED Provider Notes (Signed)
Medical screening examination/treatment/procedure(s) were conducted as a shared visit with non-physician practitioner(s) and myself.  I personally evaluated the patient during the encounter   Likely gout, however due to extent of erythema we will treat presumptively for infection with close hand f/u. Feel like with low ESR and WBC that gout is more likely.  Audree Camel, MD 05/01/13 1728

## 2013-05-01 NOTE — ED Notes (Signed)
Pt from [facility] for rt hand tenderness and swelling, started yesterday

## 2013-05-01 NOTE — ED Provider Notes (Signed)
CSN: [id number]     Arrival date & time 05/01/13  0802 History   First MD Initiated Contact with Patient 05/01/13 0827     Chief Complaint  Patient presents with  . Hand Pain   (Consider location/radiation/quality/duration/timing/severity/associated sxs/prior Treatment) HPI  [name] is a 62 year old male who is currently IVCD with a [facility] who presents today with chief complaint of right hand pain.  Patient states that he began having sudden onset pain in the right first carpometacarpal joint yesterday morning.  He states that his crit increasingly worse and.  He is unable to grasp and hold things with the hand due to pain.  No previous history of inflammatory arthritis or gout.  He denies any trauma to the hand or recent insect or animal bites.  The patient denies fevers, chills, rashes or myalgias. The patient has no other complaints at this time.  Past Medical History  Diagnosis Date  . Osteoarthritis X years    left knee.  Distant hx (age 68) "dislocated" knee and required surgery, then crush injury to patella required knee cap removal.  . Diverticular disease     "itis" x 2 episodes (last was about 2007)  . Tobacco dependence   . Multiple rib fractures 10/09/10    s/p fall down a ravine--9th and 10th on right, contusion of left 7th and 8th ribs  . Fracture of metatarsal bone(s), closed     3rd, 4th, 5th mid shaft on left foot  . History of substance abuse     cocaine, marijuana, acid, alcohol (in remission since 2010)  . Solar dermatitis     face and ears  . Bipolar disorder     Has had multiple psych hospitalization in the past, most recently at [facility] 01/31/11-02/04/11.  Says meds made him emotionally blunted. (lamictal and wellbutrin).  . Alcoholism     "Dry" since 2002  . Depression    Past Surgical History  Procedure Laterality Date  . Knee dislocation surgery  1966  . Patellectomy  1968    s/p crush injury  . Inguinal hernia  repair      left  . Hernia repair      at 62 years of age  58  . Total knee arthroplasty  11/20/2011    Procedure: TOTAL KNEE ARTHROPLASTY;  Surgeon: Loreta Ave, MD;  Location: [facility] OR;  Service: Orthopedics;  Laterality: Left;  DR Eulah Pont WANTS FOR THIS CASE  . Inguinal hernia repair  06/15/2012    Procedure: HERNIA REPAIR INGUINAL INCARCERATED;  Surgeon: Mariella Saa, MD;  [facility];  Service: General;  Laterality: Right;  . Bowel resection  06/15/2012    Procedure: SMALL BOWEL RESECTION;  Surgeon: Mariella Saa, MD;  [facility];  Service: General;  Laterality: N/A;  . Femur im nail  08/06/2012    Procedure: INTRAMEDULLARY (IM) NAIL FEMORAL;  Surgeon: Javier Docker, MD;  [facility];  Service: Orthopedics;  Laterality: Left;   Family History  Problem Relation Age of Onset  . Arthritis Mother   . Arthritis Father   . Cancer Brother     lung cancer.  Died in early 51s.   History  Substance Use Topics  . Smoking status: Current Every Day Smoker -- 1.00 packs/day for 30 years    Types: Cigarettes  . Smokeless tobacco: Never Used  . Alcohol Use: Yes     Comment: daily 6 beers and 1 pint  of vodka    Review of Systems  Constitutional: Negative for fever and chills.  Respiratory: Negative for cough and shortness of breath.   Cardiovascular: Negative for chest pain and palpitations.  Gastrointestinal: Negative for vomiting, abdominal pain, diarrhea and constipation.  Genitourinary: Negative for dysuria, urgency and frequency.  Musculoskeletal: Positive for joint swelling. Negative for myalgias.  Skin: Negative for rash and wound.  Neurological: Negative for headaches.    Allergies  Codeine  Home Medications   Current Outpatient Rx  Name  Route  Sig  Dispense  Refill  . acetaminophen (TYLENOL) 500 MG tablet   Oral   Take 1 tablet (500 mg total) by mouth every 6 (six) hours as needed for pain or fever.   30 tablet   0   .  divalproex (DEPAKOTE ER) 500 MG 24 hr tablet   Oral   Take 1 tablet (500 mg total) by mouth daily. For stabilization   30 tablet   0   . gabapentin (NEURONTIN) 100 MG capsule   Oral   Take 1 capsule (100 mg total) by mouth 3 (three) times daily. For anxiety/paim management   90 capsule   0   . methocarbamol (ROBAXIN) 500 MG tablet   Oral   Take 1 tablet (500 mg total) by mouth 3 (three) times daily. For muscle pian   90 tablet   0   . Multiple Vitamins-Minerals (MULTIVITAMIN WITH MINERALS) tablet   Oral   Take 1 tablet by mouth daily. For low vitamin         . traZODone (DESYREL) 50 MG tablet   Oral   Take 1 tablet (50 mg total) by mouth at bedtime as needed and may repeat dose one time if needed for sleep.   60 tablet   0    BP 125/84  Pulse 56  Temp(Src) 98.3 F (36.8 C) (Oral)  Resp 16  SpO2 96% Physical Exam  Nursing note and vitals reviewed. Constitutional: He appears well-developed and well-nourished. No distress.  HENT:  Head: Normocephalic and atraumatic.  Eyes: Conjunctivae are normal. No scleral icterus.  Neck: Normal range of motion. Neck supple.  Cardiovascular: Normal rate, regular rhythm and normal heart sounds.   Pulmonary/Chest: Effort normal and breath sounds normal. No respiratory distress.  Abdominal: Soft. There is no tenderness.  Musculoskeletal: He exhibits no edema.  Tender, swollen and warm right thumb, worse at the carpometacarpal joint.  There is red aches and change over the raised in a well-demarcated serpiginous pattern. No streaking or lymphadenopathy.  The patient has very limited range of motion in the [facility] joint with both passive and active range of motion.  He is neurovascularly intact.  Neurological: He is alert.  Skin: Skin is warm and dry. He is not diaphoretic.  Psychiatric: His behavior is normal.    ED Course  Procedures (including critical care time) Labs Review Labs Reviewed  CBC WITH DIFFERENTIAL  BASIC METABOLIC  PANEL  SEDIMENTATION RATE  URINALYSIS, ROUTINE W REFLEX MICROSCOPIC  URIC ACID  C-REACTIVE PROTEIN   Imaging Review Dg Hand Complete Right  05/01/2013   *RADIOLOGY REPORT*  Clinical Data: Swelling without injury  RIGHT HAND - COMPLETE 3+ VIEW  Comparison: None.  Findings: No acute fracture or dislocation is noted.  No gross soft tissue abnormality is seen.  Degenerative changes at the first carpometacarpal articulation are seen with mild subluxation.  IMPRESSION: Chronic changes without acute abnormality.   Original Report Authenticated By: Alcide Clever, M.D.  MDM   1. Thumb pain, right    10:00 AM BP 125/84  Pulse 56  Temp(Src) 98.3 F (36.8 C) (Oral)  Resp 16  SpO2 96% Patient with hand pain, heat, redness and swelling.  Patient is seen and shared visit with Dr. Criss Alvine. Unsure if the patient has inflammatory or infectious process.  I've ordered basic labs including CRP, sedimentation rate and uric acid.  Patient's CBC shows no elevation in his white count. X-ray of the hand is without acute abnormality.  There are degenerative changes at the first [facility].  11:56 AM Patient with continued swelling. Uric acid and CRP are pending.  I will d/c the patient with clindamycin and ibuprofen. I have given him decadron IV here at the ED. Cellulitis precautions given. Area demarcated. Follow up with Hand this week.        Arthor Captain, PA-C 05/01/13 1158

## 2013-05-11 ENCOUNTER — Encounter (INDEPENDENT_AMBULATORY_CARE_PROVIDER_SITE_OTHER): Payer: Self-pay

## 2013-05-11 NOTE — Progress Notes (Incomplete)
Re'c call from [facility] (Dr. Matthias Hughs) stating they are still waiting for patient referral to be made from our office.  Called office back and spoke to scheduling and was advised that they do have the referral and have contacted the patient on 2 different occasions (05/04/13 & 05/11/13) [facility] is waiting

## 2013-06-16 ENCOUNTER — Emergency Department (HOSPITAL_COMMUNITY)
Admission: EM | Admit: 2013-06-16 | Discharge: 2013-06-17 | Disposition: A | Discharge status: Home / Self Care | Payer: Medicaid Other | Attending: Emergency Medicine | Admitting: Emergency Medicine

## 2013-06-16 DIAGNOSIS — Z872 Personal history of diseases of the skin and subcutaneous tissue: Secondary | ICD-10-CM | POA: Insufficient documentation

## 2013-06-16 DIAGNOSIS — K439 Ventral hernia without obstruction or gangrene: Secondary | ICD-10-CM | POA: Insufficient documentation

## 2013-06-16 DIAGNOSIS — F319 Bipolar disorder, unspecified: Secondary | ICD-10-CM | POA: Insufficient documentation

## 2013-06-16 DIAGNOSIS — Z8781 Personal history of (healed) traumatic fracture: Secondary | ICD-10-CM | POA: Insufficient documentation

## 2013-06-16 DIAGNOSIS — R45851 Suicidal ideations: Secondary | ICD-10-CM | POA: Insufficient documentation

## 2013-06-16 DIAGNOSIS — Z79899 Other long term (current) drug therapy: Secondary | ICD-10-CM | POA: Insufficient documentation

## 2013-06-16 DIAGNOSIS — F1020 Alcohol dependence, uncomplicated: Secondary | ICD-10-CM | POA: Insufficient documentation

## 2013-06-16 DIAGNOSIS — Z9889 Other specified postprocedural states: Secondary | ICD-10-CM | POA: Insufficient documentation

## 2013-06-16 DIAGNOSIS — F1010 Alcohol abuse, uncomplicated: Secondary | ICD-10-CM

## 2013-06-16 DIAGNOSIS — F17200 Nicotine dependence, unspecified, uncomplicated: Secondary | ICD-10-CM | POA: Insufficient documentation

## 2013-06-16 DIAGNOSIS — J3489 Other specified disorders of nose and nasal sinuses: Secondary | ICD-10-CM | POA: Insufficient documentation

## 2013-06-16 DIAGNOSIS — M1990 Unspecified osteoarthritis, unspecified site: Secondary | ICD-10-CM | POA: Insufficient documentation

## 2013-06-16 DIAGNOSIS — IMO0001 Reserved for inherently not codable concepts without codable children: Secondary | ICD-10-CM | POA: Insufficient documentation

## 2013-06-16 DIAGNOSIS — F10239 Alcohol dependence with withdrawal, unspecified: Secondary | ICD-10-CM

## 2013-06-16 LAB — COMPREHENSIVE METABOLIC PANEL
ALT: 13 U/L (ref 0–53)
Alkaline Phosphatase: 68 U/L (ref 39–117)
CO2: 25 mEq/L (ref 19–32)
GFR calc Af Amer: >90 mL/min (ref >90)
GFR calc non Af Amer: >90 mL/min (ref >90)
Glucose, Bld: 88 mg/dL (ref 70–99)
Potassium: 3.4 mEq/L — ABNORMAL LOW (ref 3.5–5.1)
Sodium: 137 mEq/L (ref 135–145)
Total Bilirubin: 0.9 mg/dL (ref 0.3–1.2)

## 2013-06-16 LAB — CBC
Hemoglobin: 15.2 g/dL (ref 13.0–17.0)
RBC: 4.69 MIL/uL (ref 4.22–5.81)

## 2013-06-16 LAB — RAPID URINE DRUG SCREEN, HOSP PERFORMED: Barbiturates: NOT DETECTED

## 2013-06-16 MED ORDER — NICOTINE 21 MG/24HR TD PT24: 21.0000 mg | MEDICATED_PATCH | Freq: Every day | TRANSDERMAL | Status: DC

## 2013-06-16 MED ORDER — ZOLPIDEM TARTRATE 5 MG PO TABS: 5.0000 mg | ORAL_TABLET | Freq: Every evening | ORAL | Status: DC | PRN

## 2013-06-16 MED ORDER — LORAZEPAM 1 MG PO TABS: 0.0000 mg | ORAL_TABLET | Freq: Two times a day (BID) | ORAL | Status: DC

## 2013-06-16 MED ORDER — LORAZEPAM 1 MG PO TABS
0.0000 mg | ORAL_TABLET | Freq: Four times a day (QID) | ORAL | Status: DC
  Administered 2013-06-16 (×2): 1 mg via ORAL
  Filled 2013-06-16 (×2): qty 1

## 2013-06-16 MED ORDER — ACETAMINOPHEN 325 MG PO TABS
650.0000 mg | ORAL_TABLET | ORAL | Status: DC | PRN
  Administered 2013-06-16: 650 mg via ORAL
  Filled 2013-06-16: qty 2

## 2013-06-16 MED ORDER — LORAZEPAM 2 MG/ML IJ SOLN: 0.0000 mg | Freq: Two times a day (BID) | INTRAMUSCULAR | Status: DC

## 2013-06-16 MED ORDER — LORAZEPAM 1 MG PO TABS
1.0000 mg | ORAL_TABLET | Freq: Three times a day (TID) | ORAL | Status: DC | PRN
  Administered 2013-06-16 – 2013-06-17 (×2): 1 mg via ORAL
  Filled 2013-06-16 (×2): qty 1

## 2013-06-16 MED ORDER — ONDANSETRON HCL 4 MG PO TABS: 4.0000 mg | ORAL_TABLET | Freq: Three times a day (TID) | ORAL | Status: DC | PRN

## 2013-06-16 MED ORDER — VITAMIN B-1 100 MG PO TABS
100.0000 mg | ORAL_TABLET | Freq: Every day | ORAL | Status: DC
  Administered 2013-06-16 – 2013-06-17 (×2): 100 mg via ORAL
  Filled 2013-06-16 (×2): qty 1

## 2013-06-16 MED ORDER — ALUM & MAG HYDROXIDE-SIMETH 200-200-20 MG/5ML PO SUSP: 30.0000 mL | ORAL | Status: DC | PRN

## 2013-06-16 MED ORDER — LORAZEPAM 2 MG/ML IJ SOLN: 0.0000 mg | Freq: Four times a day (QID) | INTRAMUSCULAR | Status: DC

## 2013-06-16 MED ORDER — IBUPROFEN 200 MG PO TABS: 600.0000 mg | ORAL_TABLET | Freq: Three times a day (TID) | ORAL | Status: DC | PRN

## 2013-06-16 MED ORDER — THIAMINE HCL 100 MG/ML IJ SOLN: 100.0000 mg | Freq: Every day | INTRAMUSCULAR | Status: DC

## 2013-06-16 NOTE — ED Notes (Signed)
Per EMS: Pt from [place].  States that he is going through etoh withdrawals.  Last drink was Monday.  States that he is feeling shaky, and last night vomited. States that he has not vomited since but feels nauseated.  Pt is suicidal.  Not homicidal.

## 2013-06-16 NOTE — ED Provider Notes (Signed)
CSN: [id number]     Arrival date & time 06/16/13  1452 History   First MD Initiated Contact with Patient 06/16/13 1505     Chief Complaint  Patient presents with  . Medical Clearance   (Consider location/radiation/quality/duration/timing/severity/associated sxs/prior Treatment) HPI Comments: Patient is a 62 year old male who presents today sent from [facility] for medical clearance. It was reported that they could not control the shaking while he was there which is what prompted them to send him here. He has been struggling with worsening depression for quite some time and was at [facility] for suicidal ideation and alcohol withdrawal. No prior suicide attempts. No set plan of suicide. He drinks approximately 1/5 of liquor daily. His last drink was Monday. He has never had a seizure or DTs due to alcohol withdrawal. He was self medicating due to the pain in his left knee and left hip. He has hx of TKA in 2013 and hip surgery for broken femur later that year. He also reports he has been having episodes of bowel incontinence that have been occuring since his hernia repair many years ago. He denies any other drug use. No visual or auditory hallucinations.  The history is provided by the patient. No language interpreter was used.    Past Medical History  Diagnosis Date  . Osteoarthritis X years    left knee.  Distant hx (age 56) "dislocated" knee and required surgery, then crush injury to patella required knee cap removal.  . Diverticular disease     "itis" x 2 episodes (last was about 2007)  . Tobacco dependence   . Multiple rib fractures 10/09/10    s/p fall down a ravine--9th and 10th on right, contusion of left 7th and 8th ribs  . Fracture of metatarsal bone(s), closed     3rd, 4th, 5th mid shaft on left foot  . History of substance abuse     cocaine, marijuana, acid, alcohol (in remission since 2010)  . Solar dermatitis     face and ears  . Bipolar disorder     Has had multiple psych  hospitalization in the past, most recently at [facility] 01/31/11-02/04/11.  Says meds made him emotionally blunted. (lamictal and wellbutrin).  . Alcoholism     "Dry" since 2002  . Depression    Past Surgical History  Procedure Laterality Date  . Knee dislocation surgery  1966  . Patellectomy  1968    s/p crush injury  . Inguinal hernia repair      left  . Hernia repair      at 62 years of age  14  . Total knee arthroplasty  11/20/2011    Procedure: TOTAL KNEE ARTHROPLASTY;  Surgeon: Loreta Ave, MD;  Location: [facility] OR;  Service: Orthopedics;  Laterality: Left;  DR Eulah Pont WANTS FOR THIS CASE  . Inguinal hernia repair  06/15/2012    Procedure: HERNIA REPAIR INGUINAL INCARCERATED;  Surgeon: Mariella Saa, MD;  [facility];  Service: General;  Laterality: Right;  . Bowel resection  06/15/2012    Procedure: SMALL BOWEL RESECTION;  Surgeon: Mariella Saa, MD;  Location[facility];  Service: General;  Laterality: N/A;  . Femur im nail  08/06/2012    Procedure: INTRAMEDULLARY (IM) NAIL FEMORAL;  Surgeon: Javier Docker, MD;  [facility];  Service: Orthopedics;  Laterality: Left;   Family History  Problem Relation Age of Onset  . Arthritis Mother   . Arthritis Father   .  Cancer Brother     lung cancer.  Died in early 49s.   History  Substance Use Topics  . Smoking status: Current Every Day Smoker -- 1.00 packs/day for 30 years    Types: Cigarettes  . Smokeless tobacco: Never Used  . Alcohol Use: Yes     Comment: daily 6 beers and 1 pint of vodka    Review of Systems  Constitutional: Negative for fever and chills.  HENT: Positive for rhinorrhea (x 1 month).   Respiratory: Negative for shortness of breath.   Cardiovascular: Negative for chest pain.  Gastrointestinal: Positive for abdominal pain. Negative for nausea and vomiting.  Musculoskeletal: Positive for arthralgias and myalgias.  Psychiatric/Behavioral: Positive for dysphoric mood.  All  other systems reviewed and are negative.    Allergies  Codeine  Home Medications   Current Outpatient Rx  Name  Route  Sig  Dispense  Refill  . acetaminophen (TYLENOL) 500 MG tablet   Oral   Take 1 tablet (500 mg total) by mouth every 6 (six) hours as needed for pain or fever.   30 tablet   0   . divalproex (DEPAKOTE ER) 500 MG 24 hr tablet   Oral   Take 1 tablet (500 mg total) by mouth daily. For stabilization   30 tablet   0   . gabapentin (NEURONTIN) 100 MG capsule   Oral   Take 1 capsule (100 mg total) by mouth 3 (three) times daily. For anxiety/paim management   90 capsule   0   . ibuprofen (ADVIL,MOTRIN) 800 MG tablet   Oral   Take 1 tablet (800 mg total) by mouth every 8 (eight) hours as needed for pain.   30 tablet   0   . Multiple Vitamins-Minerals (MULTIVITAMIN WITH MINERALS) tablet   Oral   Take 1 tablet by mouth daily. For low vitamin         . traZODone (DESYREL) 50 MG tablet   Oral   Take 1 tablet (50 mg total) by mouth at bedtime as needed and may repeat dose one time if needed for sleep.   60 tablet   0    BP 131/81  Pulse 58  Temp(Src) 97.8 F (36.6 C) (Oral)  Resp 16  SpO2 97% Physical Exam  Nursing note and vitals reviewed. Constitutional: He is oriented to person, place, and time. He appears well-developed and well-nourished. No distress.  HENT:  Head: Normocephalic and atraumatic.  Right Ear: External ear normal.  Left Ear: External ear normal.  Nose: Nose normal.  Eyes: Conjunctivae are normal.  Neck: Normal range of motion. No tracheal deviation present.  Cardiovascular: Normal rate, regular rhythm and normal heart sounds.   Pulmonary/Chest: Effort normal and breath sounds normal. No stridor.  Abdominal: Soft. He exhibits no distension. There is no tenderness. A hernia is present. Hernia confirmed positive in the ventral area.  Large ventral hernia. No evidence of strangulation or entrapment.   Musculoskeletal: Normal range of  motion.  Neurological: He is alert and oriented to person, place, and time.  tremulous  Skin: Skin is warm and dry. He is not diaphoretic.  Psychiatric: He has a normal mood and affect. His behavior is normal.    ED Course  Procedures (including critical care time) Labs Review Labs Reviewed  COMPREHENSIVE METABOLIC PANEL - Abnormal; Notable for the following:    Potassium 3.4 (*)    All other components within normal limits  CBC  ETHANOL  URINE RAPID DRUG SCREEN (  HOSP PERFORMED)   Imaging Review No results found.  EKG Interpretation   None       MDM   1. Alcohol abuse   2. Alcohol withdrawal   3. Suicidal ideation    Patient presents from [facility] after becoming too tremulous while detoxing from alcohol. He is still scoring 8 on CIWA protocol. He will stay here while being stabilized and then be transferred back to [facility] likely in the morning. Allyne Gee, PA-C made aware and will discuss this with the 6am PA. Patient is otherwise medically clear. Vital signs stable.    Mora Bellman, PA-C 06/17/13 0222

## 2013-06-17 ENCOUNTER — Encounter (HOSPITAL_COMMUNITY): Payer: Self-pay | Admitting: Emergency Medicine

## 2013-06-17 MED ORDER — CHLORDIAZEPOXIDE HCL 25 MG PO CAPS: 25.0000 mg | ORAL_CAPSULE | Freq: Four times a day (QID) | ORAL | Status: DC | PRN

## 2013-06-17 NOTE — Progress Notes (Signed)
[facility] spoke with patient about the [facility] [employer]. Patient stated that he had Medicaid.

## 2013-06-17 NOTE — BH Assessment (Signed)
Assessment Note  Shawn [name] is a 62 y.o. male who presents for depression and detox.  Pt states he hasn't any alcohol since 06/14/13 and no alcohol present in his UDS.  Pt meets no criteria for inpt admission for detox.  Pt denies SI/HI/AVH.  Pt reports increased depressive sxs: poor appetite, insomnia.  Pt was transferred from [facility] due to tremors, denies any current w/d sxs.  Pt is safe to return home and pursue outpatient rehab.        Axis I: Depressive Disorder NOS and Alcohol Dependence  Axis II: Deferred Axis III:  Past Medical History  Diagnosis Date  . Osteoarthritis X years    left knee.  Distant hx (age 60) "dislocated" knee and required surgery, then crush injury to patella required knee cap removal.  . Diverticular disease     "itis" x 2 episodes (last was about 2007)  . Tobacco dependence   . Multiple rib fractures 10/09/10    s/p fall down a ravine--9th and 10th on right, contusion of left 7th and 8th ribs  . Fracture of metatarsal bone(s), closed     3rd, 4th, 5th mid shaft on left foot  . History of substance abuse     cocaine, marijuana, acid, alcohol (in remission since 2010)  . Solar dermatitis     face and ears  . Bipolar disorder     Has had multiple psych hospitalization in the past, most recently at [facility] 01/31/11-02/04/11.  Says meds made him emotionally blunted. (lamictal and wellbutrin).  . Alcoholism     "Dry" since 2002  . Depression    Axis IV: other psychosocial or environmental problems, problems related to social environment and problems with primary support group Axis V: 51-60 moderate symptoms  Past Medical History:  Past Medical History  Diagnosis Date  . Osteoarthritis X years    left knee.  Distant hx (age 21) "dislocated" knee and required surgery, then crush injury to patella required knee cap removal.  . Diverticular disease     "itis" x 2 episodes (last was about 2007)  . Tobacco dependence   . Multiple rib fractures  10/09/10    s/p fall down a ravine--9th and 10th on right, contusion of left 7th and 8th ribs  . Fracture of metatarsal bone(s), closed     3rd, 4th, 5th mid shaft on left foot  . History of substance abuse     cocaine, marijuana, acid, alcohol (in remission since 2010)  . Solar dermatitis     face and ears  . Bipolar disorder     Has had multiple psych hospitalization in the past, most recently at [facility] center 01/31/11-02/04/11.  Says meds made him emotionally blunted. (lamictal and wellbutrin).  . Alcoholism     "Dry" since 2002  . Depression     Past Surgical History  Procedure Laterality Date  . Knee dislocation surgery  1966  . Patellectomy  1968    s/p crush injury  . Inguinal hernia repair      left  . Hernia repair      at 62 years of age  43  . Total knee arthroplasty  11/20/2011    Procedure: TOTAL KNEE ARTHROPLASTY;  Surgeon: Loreta Ave, MD;  Location: [facility] OR;  Service: Orthopedics;  Laterality: Left;  DR Eulah Pont WANTS FOR THIS CASE  . Inguinal hernia repair  06/15/2012    Procedure: HERNIA REPAIR INGUINAL INCARCERATED;  Surgeon: Mariella Saa, MD;  Location: [facility] ORS;  Service: General;  Laterality: Right;  . Bowel resection  06/15/2012    Procedure: SMALL BOWEL RESECTION;  Surgeon: Mariella Saa, MD;  Location: [facility]S;  Service: General;  Laterality: N/A;  . Femur im nail  08/06/2012    Procedure: INTRAMEDULLARY (IM) NAIL FEMORAL;  Surgeon: Javier Docker, MD;  Location: [facility]S;  Service: Orthopedics;  Laterality: Left;    Family History:  Family History  Problem Relation Age of Onset  . Arthritis Mother   . Arthritis Father   . Cancer Brother     lung cancer.  Died in early 19s.    Social History:  reports that he has been smoking Cigarettes.  He has a 30 pack-year smoking history. He has never used smokeless tobacco. He reports that he drinks alcohol. He reports that he does not use illicit drugs.  Additional Social History:   Alcohol / Drug Use Pain Medications: See MAR  Prescriptions: See MAR  Over the Counter: See MAR  History of alcohol / drug use?: Yes Longest period of sobriety (when/how long): Only when in detox--[facility] 2013 Negative Consequences of Use: Work / School;Personal relationships Withdrawal Symptoms: Other (Comment) (No current w/d sxs ) Substance #1 Name of Substance 1: Alcohol  1 - Age of First Use: Teens  1 - Amount (size/oz): 1 Pint  1 - Frequency: Daily  1 - Duration: On-going  1 - Last Use / Amount: 06/14/13  CIWA: CIWA-Ar BP:  (Pt. sleeping) Pulse Rate: 54 Nausea and Vomiting: no nausea and no vomiting Tactile Disturbances: none Tremor: three Auditory Disturbances: not present Paroxysmal Sweats: no sweat visible Visual Disturbances: not present Anxiety: three Headache, Fullness in Head: mild Agitation: normal activity Orientation and Clouding of Sensorium: oriented and can do serial additions CIWA-Ar Total: 8 COWS:    Allergies:  Allergies  Allergen Reactions  . Codeine Nausea Only    Home Medications:  (Not in a hospital admission)  OB/GYN Status:  No LMP for male patient.  General Assessment Data [facility] of [facility]: [facility] Is this a Tele or Face-to-Face Assessment?: Face-to-Face Is this an Initial Assessment or a Re-assessment for this encounter?: Initial Assessment Living Arrangements: Alone Can pt return to current living arrangement?: Yes Admission Status: Voluntary Is patient capable of signing voluntary admission?: Yes Transfer from: Acute Hospital Referral Source: MD  Medical Screening Exam [facility] Walk-in ONLY) Medical Exam completed: No Reason for MSE not completed: Other: (None )  [facility] Crisis Care Plan Living Arrangements: Alone Name of Psychiatrist: None  Name of Therapist: None   Education Status Is patient currently in school?: No Current Grade: None  Highest grade of school patient has completed: None  Name of school: None  Contact  person: None   Risk to self Suicidal Ideation: No Suicidal Intent: No Is patient at risk for suicide?: No Suicidal Plan?: No Access to Means: No What has been your use of drugs/alcohol within the last 12 months?: Abusing: alcohol  Previous Attempts/Gestures: No How many times?: 0 Other Self Harm Risks: None  Triggers for Past Attempts: None known Intentional Self Injurious Behavior: None Family Suicide History: No Recent stressful life event(s): Other (Comment) (Health problems; Chronic SA ) Persecutory voices/beliefs?: No Depression: Yes Depression Symptoms: Loss of interest in usual pleasures;Feeling worthless/self pity;Insomnia Substance abuse history and/or treatment for substance abuse?: Yes Suicide prevention information given to non-admitted patients: Not applicable  Risk to Others Homicidal Ideation: No Thoughts of Harm to Others: No Current Homicidal Intent:  No Current Homicidal Plan: No Access to Homicidal Means: No Identified Victim: None  History of harm to others?: No Assessment of Violence: None Noted Violent Behavior Description: None  Does patient have access to weapons?: No Criminal Charges Pending?: No Does patient have a court date: No  Psychosis Hallucinations: None noted Delusions: None noted  Mental Status Report Appear/Hygiene: Disheveled Eye Contact: Fair Motor Activity: Unremarkable Speech: Logical/coherent Level of Consciousness: Alert Mood: Depressed Affect: Depressed Anxiety Level: Minimal Thought Processes: Coherent;Relevant Judgement: Unimpaired Orientation: Person;Place;Time;Situation Obsessive Compulsive Thoughts/Behaviors: None  Cognitive Functioning Concentration: Normal Memory: Recent Intact;Remote Intact IQ: Average Insight: Fair Impulse Control: Fair Appetite: Fair Weight Loss: 0 Weight Gain: 0 Sleep: Decreased Total Hours of Sleep:  (No sleep x3 days ) Vegetative Symptoms: None  ADLScreening [facility] Assessment  Services) Patient's cognitive ability adequate to safely complete daily activities?: Yes Patient able to express need for assistance with ADLs?: Yes Independently performs ADLs?: Yes (appropriate for developmental age)  Prior Inpatient Therapy Prior Inpatient Therapy: Yes Prior Therapy Dates: 2013 Prior Therapy Facilty/Provider(s): [facility]  Reason for Treatment: Detox  Prior Outpatient Therapy Prior Outpatient Therapy: No Prior Therapy Dates: None  Prior Therapy Facilty/Provider(s): None  Reason for Treatment: None   ADL Screening (condition at time of admission) Patient's cognitive ability adequate to safely complete daily activities?: Yes Is the patient deaf or have difficulty hearing?: No Does the patient have difficulty seeing, even when wearing glasses/contacts?: No Does the patient have difficulty concentrating, remembering, or making decisions?: No Patient able to express need for assistance with ADLs?: Yes Does the patient have difficulty dressing or bathing?: No Independently performs ADLs?: Yes (appropriate for developmental age) Does the patient have difficulty walking or climbing stairs?: No Weakness of Legs: None Weakness of Arms/Hands: None  Home Assistive Devices/Equipment Home Assistive Devices/Equipment: None  Therapy Consults (therapy consults require a physician order) PT Evaluation Needed: No OT Evalulation Needed: No SLP Evaluation Needed: No Abuse/Neglect Assessment (Assessment to be complete while patient is alone) Physical Abuse: Denies Verbal Abuse: Denies Sexual Abuse: Denies Self-Neglect: Denies Values / Beliefs Cultural Requests During Hospitalization: None Spiritual Requests During Hospitalization: None Consults Spiritual Care Consult Needed: No Social Work Consult Needed: No Merchant navy officer (For Healthcare) Advance Directive: Patient does not have advance directive;Patient would not like information Pre-existing out of facility DNR order  (yellow form or pink MOST form): No Nutrition Screen- [facility] Patient's home diet: Regular  Additional Information 1:1 In Past 12 Months?: No CIRT Risk: No Elopement Risk: No Does patient have medical clearance?: Yes     Disposition:  Disposition Initial Assessment Completed for this Encounter: Yes Disposition of Patient: Outpatient treatment;Referred to (Rehab resources ) Type of outpatient treatment: Adult Patient referred to: Outpatient clinic referral;Other (Comment) (Rehab resources )  On Site Evaluation by:   Reviewed with Physician:    Murrell Redden 06/17/2013 6:36 AM

## 2013-06-17 NOTE — ED Provider Notes (Signed)
Medical screening examination/treatment/procedure(s) were performed by non-physician practitioner and as supervising physician I was immediately available for consultation/collaboration.  Shon Baton, MD 06/17/13 [phone]

## 2013-06-17 NOTE — ED Provider Notes (Addendum)
Pt seen and examined, no signs of DTs at this time--last drink was 3 days ago, referrals given along with librium. Pt denies SI at time of d/c  Toy Baker, MD 06/17/13 1610  Toy Baker, MD 06/17/13 [phone]

## 2013-06-17 NOTE — [facility] Counselor (Signed)
No criteria for detox, last use of alcohol was 06/14/13. Pt is agreeable with rehab resources for continued SA tx. discussed disposition with Dr. Freida Busman.  Pt will be d/c.

## 2013-08-12 ENCOUNTER — Encounter (HOSPITAL_COMMUNITY): Payer: Self-pay | Admitting: Emergency Medicine

## 2013-08-12 ENCOUNTER — Emergency Department (HOSPITAL_COMMUNITY)
Admission: EM | Admit: 2013-08-12 | Discharge: 2013-08-12 | Disposition: A | Discharge status: Home / Self Care | Payer: Medicaid Other | Attending: Emergency Medicine | Admitting: Emergency Medicine

## 2013-08-12 ENCOUNTER — Emergency Department (HOSPITAL_COMMUNITY): Payer: Medicaid Other

## 2013-08-12 DIAGNOSIS — Z79899 Other long term (current) drug therapy: Secondary | ICD-10-CM | POA: Insufficient documentation

## 2013-08-12 DIAGNOSIS — Z8739 Personal history of other diseases of the musculoskeletal system and connective tissue: Secondary | ICD-10-CM | POA: Insufficient documentation

## 2013-08-12 DIAGNOSIS — R45851 Suicidal ideations: Secondary | ICD-10-CM

## 2013-08-12 DIAGNOSIS — F1010 Alcohol abuse, uncomplicated: Secondary | ICD-10-CM

## 2013-08-12 DIAGNOSIS — Z8719 Personal history of other diseases of the digestive system: Secondary | ICD-10-CM | POA: Insufficient documentation

## 2013-08-12 DIAGNOSIS — Z872 Personal history of diseases of the skin and subcutaneous tissue: Secondary | ICD-10-CM | POA: Insufficient documentation

## 2013-08-12 DIAGNOSIS — Z9089 Acquired absence of other organs: Secondary | ICD-10-CM | POA: Insufficient documentation

## 2013-08-12 DIAGNOSIS — Z8781 Personal history of (healed) traumatic fracture: Secondary | ICD-10-CM | POA: Insufficient documentation

## 2013-08-12 DIAGNOSIS — M25569 Pain in unspecified knee: Secondary | ICD-10-CM | POA: Insufficient documentation

## 2013-08-12 DIAGNOSIS — F1020 Alcohol dependence, uncomplicated: Secondary | ICD-10-CM

## 2013-08-12 DIAGNOSIS — F319 Bipolar disorder, unspecified: Secondary | ICD-10-CM | POA: Insufficient documentation

## 2013-08-12 DIAGNOSIS — F17200 Nicotine dependence, unspecified, uncomplicated: Secondary | ICD-10-CM | POA: Insufficient documentation

## 2013-08-12 LAB — RAPID URINE DRUG SCREEN, HOSP PERFORMED
Amphetamines: NOT DETECTED
Cocaine: NOT DETECTED
Opiates: NOT DETECTED
Tetrahydrocannabinol: NOT DETECTED

## 2013-08-12 LAB — COMPREHENSIVE METABOLIC PANEL
ALT: 17 U/L (ref 0–53)
AST: 36 U/L (ref 0–37)
Albumin: 3.5 g/dL (ref 3.5–5.2)
Calcium: 8.7 mg/dL (ref 8.4–10.5)
Creatinine, Ser: 0.8 mg/dL (ref 0.50–1.35)
GFR calc non Af Amer: >90 mL/min (ref >90)
Total Protein: 6.2 g/dL (ref 6.0–8.3)

## 2013-08-12 LAB — CBC WITH DIFFERENTIAL/PLATELET
Basophils Absolute: 0 10*3/uL (ref 0.0–0.1)
Basophils Relative: 0 % (ref 0–1)
Eosinophils Absolute: 0 10*3/uL (ref 0.0–0.7)
Eosinophils Relative: 0 % (ref 0–5)
HCT: 39.8 % (ref 39.0–52.0)
MCH: 31.9 pg (ref 26.0–34.0)
MCHC: 34.4 g/dL (ref 30.0–36.0)
MCV: 92.6 fL (ref 78.0–100.0)
Monocytes Absolute: 0.3 10*3/uL (ref 0.1–1.0)
RDW: 13.7 % (ref 11.5–15.5)

## 2013-08-12 MED ORDER — ALUM & MAG HYDROXIDE-SIMETH 200-200-20 MG/5ML PO SUSP: 30.0000 mL | ORAL | Status: DC | PRN

## 2013-08-12 MED ORDER — LORAZEPAM 1 MG PO TABS
1.0000 mg | ORAL_TABLET | Freq: Three times a day (TID) | ORAL | Status: DC | PRN
  Administered 2013-08-12: 1 mg via ORAL
  Filled 2013-08-12: qty 1

## 2013-08-12 MED ORDER — ZOLPIDEM TARTRATE 5 MG PO TABS: 5.0000 mg | ORAL_TABLET | Freq: Every evening | ORAL | Status: DC | PRN

## 2013-08-12 MED ORDER — ACETAMINOPHEN 325 MG PO TABS: 650.0000 mg | ORAL_TABLET | ORAL | Status: DC | PRN

## 2013-08-12 MED ORDER — ONDANSETRON HCL 4 MG PO TABS: 4.0000 mg | ORAL_TABLET | Freq: Three times a day (TID) | ORAL | Status: DC | PRN

## 2013-08-12 MED ORDER — IBUPROFEN 200 MG PO TABS: 600.0000 mg | ORAL_TABLET | Freq: Three times a day (TID) | ORAL | Status: DC | PRN

## 2013-08-12 MED ORDER — NICOTINE 21 MG/24HR TD PT24
21.0000 mg | MEDICATED_PATCH | Freq: Every day | TRANSDERMAL | Status: DC
  Administered 2013-08-12: 21 mg via TRANSDERMAL
  Filled 2013-08-12: qty 1

## 2013-08-12 NOTE — ED Notes (Addendum)
Pt from [facility] with c/o of withdrawals from alcohol. Nurse states that pt has been very shaky for the past 12 hours. Normally drinks about a pint a day. SI, Denies HI AH/VH.

## 2013-08-12 NOTE — ED Notes (Signed)
Patient states he is seeking medical clearance/detox. Patient states his last drink was yesterday.

## 2013-08-12 NOTE — ED Notes (Signed)
Patient transported to X-ray 

## 2013-08-12 NOTE — Progress Notes (Signed)
Writer consulted with Psychiatrist (Dr. Ladona Ridgel) and the NP [facility]) regarding the patient not meeting criteria for inpatient hospitalization.   Per, Dr. Ladona Ridgel reports that the patient can be moved back to (medical) ED or discharged home if EDP has found patient medically stable.   Patient was given outpatient resource and contact information for long term rehab facility treatment.  Writer contacted the ER MD (Dr. Ermalene Postin) and the nurse of the patient dispositon.

## 2013-08-12 NOTE — ED Provider Notes (Signed)
CSN: [id number]     Arrival date & time 08/12/13  4098 History   First MD Initiated Contact with Patient 08/12/13 1137     Chief Complaint  Patient presents with  . Medical Clearance   (Consider location/radiation/quality/duration/timing/severity/associated sxs/prior Treatment) HPI Comments: Patient is a 62 year old male with a history of alcohol abuse who presents today for assistance being placed in an alcohol rehabilitation program. Patient states that he drinks 1 pint of liquor daily. His last drink was yesterday. Denies hx of seizures from alcohol withdrawal. Patient denies SI/HI and illicit drug use. Patient with secondary c/o L knee pain that is nonradiating and intermittent. Patient has been weight bearing and ambulatory despite pain. No erythema, heat to touch, numbness/tingling, or weakness.  The history is provided by the patient. No language interpreter was used.    Past Medical History  Diagnosis Date  . Osteoarthritis X years    left knee.  Distant hx (age 52) "dislocated" knee and required surgery, then crush injury to patella required knee cap removal.  . Diverticular disease     "itis" x 2 episodes (last was about 2007)  . Tobacco dependence   . Multiple rib fractures 10/09/10    s/p fall down a ravine--9th and 10th on right, contusion of left 7th and 8th ribs  . Fracture of metatarsal bone(s), closed     3rd, 4th, 5th mid shaft on left foot  . History of substance abuse     cocaine, marijuana, acid, alcohol (in remission since 2010)  . Solar dermatitis     face and ears  . Bipolar disorder     Has had multiple psych hospitalization in the past, most recently at [facility] center 01/31/11-02/04/11.  Says meds made him emotionally blunted. (lamictal and wellbutrin).  . Alcoholism     "Dry" since 2002  . Depression    Past Surgical History  Procedure Laterality Date  . Knee dislocation surgery  1966  . Patellectomy  1968    s/p crush injury  . Inguinal hernia  repair      left  . Hernia repair      at 62 years of age  18  . Total knee arthroplasty  11/20/2011    Procedure: TOTAL KNEE ARTHROPLASTY;  Surgeon: Loreta Ave, MD;  Location: [facility] OR;  Service: Orthopedics;  Laterality: Left;  DR Eulah Pont WANTS FOR THIS CASE  . Inguinal hernia repair  06/15/2012    Procedure: HERNIA REPAIR INGUINAL INCARCERATED;  Surgeon: Mariella Saa, MD;  [facility];  Service: General;  Laterality: Right;  . Bowel resection  06/15/2012    Procedure: SMALL BOWEL RESECTION;  Surgeon: Mariella Saa, MD;  Location: [facility];  Service: General;  Laterality: N/A;  . Femur im nail  08/06/2012    Procedure: INTRAMEDULLARY (IM) NAIL FEMORAL;  Surgeon: Javier Docker, MD;  Location: [facility];  Service: Orthopedics;  Laterality: Left;   Family History  Problem Relation Age of Onset  . Arthritis Mother   . Arthritis Father   . Cancer Brother     lung cancer.  Died in early 47s.   History  Substance Use Topics  . Smoking status: Current Every Day Smoker -- 1.00 packs/day for 30 years    Types: Cigarettes  . Smokeless tobacco: Never Used  . Alcohol Use: Yes     Comment: daily 6 beers and 1 pint of vodka    Review of Systems  Musculoskeletal: Positive for arthralgias.  Psychiatric/Behavioral:       +etoh abuse  All other systems reviewed and are negative.    Allergies  Codeine  Home Medications   Current Outpatient Rx  Name  Route  Sig  Dispense  Refill  . acetaminophen (TYLENOL) 500 MG tablet   Oral   Take 1 tablet (500 mg total) by mouth every 6 (six) hours as needed for pain or fever.   30 tablet   0   . chlordiazePOXIDE (LIBRIUM) 25 MG capsule   Oral   Take 1 capsule (25 mg total) by mouth 4 (four) times daily as needed (tremors/ withdrawl symptoms).   12 capsule   0   . divalproex (DEPAKOTE ER) 500 MG 24 hr tablet   Oral   Take 1 tablet (500 mg total) by mouth daily. For stabilization   30 tablet   0   . gabapentin  (NEURONTIN) 100 MG capsule   Oral   Take 1 capsule (100 mg total) by mouth 3 (three) times daily. For anxiety/paim management   90 capsule   0   . ibuprofen (ADVIL,MOTRIN) 800 MG tablet   Oral   Take 1 tablet (800 mg total) by mouth every 8 (eight) hours as needed for pain.   30 tablet   0   . Multiple Vitamins-Minerals (MULTIVITAMIN WITH MINERALS) tablet   Oral   Take 1 tablet by mouth daily. For low vitamin         . traZODone (DESYREL) 50 MG tablet   Oral   Take 1 tablet (50 mg total) by mouth at bedtime as needed and may repeat dose one time if needed for sleep.   60 tablet   0    BP 166/95  Pulse 85  Temp(Src) 98.5 F (36.9 C)  Resp 20  SpO2 96%  Physical Exam  Nursing note and vitals reviewed. Constitutional: He is oriented to person, place, and time. He appears well-developed and well-nourished. No distress.  HENT:  Head: Normocephalic and atraumatic.  Eyes: Conjunctivae and EOM are normal. No scleral icterus.  Neck: Normal range of motion.  Cardiovascular: Normal rate, regular rhythm, normal heart sounds and intact distal pulses.   Pulmonary/Chest: Effort normal and breath sounds normal. No respiratory distress. He has no wheezes. He has no rales.  Musculoskeletal: Normal range of motion.  Normal ROM of L knee joint. No erythema, heat to touch, red linear streaking, crepitus or deformities. Well healed surgical scars from patellectomy in 1968.  Neurological: He is alert and oriented to person, place, and time.  No focal neurologic, sensory or motor deficits appreciated. GCS 15.  Skin: Skin is warm and dry. No rash noted. He is not diaphoretic. No erythema. No pallor.  Psychiatric: He has a normal mood and affect. His speech is normal. Cognition and memory are normal. He expresses no homicidal and no suicidal ideation. He expresses no suicidal plans and no homicidal plans.    ED Course  Procedures (including critical care time) Labs Review Labs Reviewed   CBC WITH DIFFERENTIAL - Abnormal; Notable for the following:    Neutrophils Relative % 85 (*)    Lymphocytes Relative 9 (*)    Lymphs Abs 0.4 (*)    All other components within normal limits  COMPREHENSIVE METABOLIC PANEL - Abnormal; Notable for the following:    Glucose, Bld 103 (*)    All other components within normal limits  ETHANOL  URINE RAPID DRUG SCREEN (HOSP PERFORMED)   Imaging Review Dg Knee Complete  4 Views Left  08/12/2013   CLINICAL DATA:  Left knee pain  EXAM: LEFT KNEE - COMPLETE 4+ VIEW  COMPARISON:  11/2011  FINDINGS: Total knee arthroplasty is again identified. The femoral and tibial components appear well seated and normally located. No joint effusion is identified. No acute fracture is identified. The bones are osteopenic. The patella is again seen to be absent. Scattered vascular calcifications are noted.  IMPRESSION: Unremarkable appearance of left total knee arthroplasty. No acute abnormality identified.   Electronically Signed   By: Sebastian Ache   On: 08/12/2013 13:59    EKG Interpretation   None       MDM   1. Alcohol dependence   2. ETOH abuse    62 year old male with a history of alcohol abuse presents for assistance in place in alcohol treatment facility. Patient with secondary complaint of left knee pain. He has a history of left patellectomy. Patient neurovascularly intact on physical exam. He is ambulatory without assistance. X-ray today unremarkable for acute fracture or bony deformity. Patient denies suicidal and homicidal ideations as well as illicit drug use. He is able to contract for safety. Labs unremarkable today. Patient medically cleared.  Patient evaluated by behavioral health and found to be stable for discharge with resource guide and outpatient f/u for etoh abuse. Resource guide provided. RICE advised for knee and knee sleeve applied prior to d/c. Return precautions provided. Patient hemodynamically stable and appropriate for  d/c.   Filed Vitals:   08/12/13 1047 08/12/13 1308  BP: 144/98 166/95  Pulse: 89 85  Temp: 98.5 F (36.9 C)   Resp: 20   SpO2: 96%        Antony Madura, PA-C 08/12/13 1525

## 2013-08-12 NOTE — Consult Note (Signed)
[facility] Face-to-Face Psychiatry Consult   Reason for Consult:  Suicidal Referring Physician:  EDP  Shawn [name] is an 62 y.o. male.  Assessment: AXIS I:  Alcohol Abuse AXIS II:  Deferred AXIS III:   Past Medical History  Diagnosis Date  . Osteoarthritis X years    left knee.  Distant hx (age 48) "dislocated" knee and required surgery, then crush injury to patella required knee cap removal.  . Diverticular disease     "itis" x 2 episodes (last was about 2007)  . Tobacco dependence   . Multiple rib fractures 10/09/10    s/p fall down a ravine--9th and 10th on right, contusion of left 7th and 8th ribs  . Fracture of metatarsal bone(s), closed     3rd, 4th, 5th mid shaft on left foot  . History of substance abuse     cocaine, marijuana, acid, alcohol (in remission since 2010)  . Solar dermatitis     face and ears  . Bipolar disorder     Has had multiple psych hospitalization in the past, most recently at [facility] 01/31/11-02/04/11.  Says meds made him emotionally blunted. (lamictal and wellbutrin).  . Alcoholism     "Dry" since 2002  . Depression    AXIS IV:  other psychosocial or environmental problems and problems related to social environment AXIS V:  61-70 mild symptoms  Plan:  No evidence of imminent risk to self or others at present.   Patient does not meet criteria for psychiatric inpatient admission. Supportive therapy provided about ongoing stressors. Discussed crisis plan, support from social network, calling 911, coming to the Emergency Department, and calling Suicide Hotline.  Subjective:   Shawn [name] is a 62 y.o. male.  HPI:  Patient states that he came to the hospital because he fell and is having some pain in his left knee which appears swollen and history of surgery (left knee).  Patient states that he is also having lower abdominal pain.  Patient states that he does have a problem with alcohol and drinks almost daily but the last time he has had any  alcohol was "day before yesterday" (Tuesday (08/10/13).  Patient states that he is not having any withdrawals.  Patient states that he hands are shaking "But I have a little of that anyway."  Patient states that he wants a 28 day program but doesn't need hospitalization for detox.  Patient denies suicidal/homicidal ideation, psychosis, and paranoia.   Patient informed that resources could be given with rehab facility information.    Past Psychiatric History: Past Medical History  Diagnosis Date  . Osteoarthritis X years    left knee.  Distant hx (age 33) "dislocated" knee and required surgery, then crush injury to patella required knee cap removal.  . Diverticular disease     "itis" x 2 episodes (last was about 2007)  . Tobacco dependence   . Multiple rib fractures 10/09/10    s/p fall down a ravine--9th and 10th on right, contusion of left 7th and 8th ribs  . Fracture of metatarsal bone(s), closed     3rd, 4th, 5th mid shaft on left foot  . History of substance abuse     cocaine, marijuana, acid, alcohol (in remission since 2010)  . Solar dermatitis     face and ears  . Bipolar disorder     Has had multiple psych hospitalization in the past, most recently at [facility] 01/31/11-02/04/11.  Says meds made him emotionally blunted. (  lamictal and wellbutrin).  . Alcoholism     "Dry" since 2002  . Depression     reports that he has been smoking Cigarettes.  He has a 30 pack-year smoking history. He has never used smokeless tobacco. He reports that he drinks alcohol. He reports that he does not use illicit drugs. Family History  Problem Relation Age of Onset  . Arthritis Mother   . Arthritis Father   . Cancer Brother     lung cancer.  Died in early 92s.           Allergies:   Allergies  Allergen Reactions  . Codeine Nausea Only    ACT Assessment Complete:  No:   Past Psychiatric History: Diagnosis:  Alcoholism  Hospitalizations:  History of past detox  Outpatient Care:   [facility]  Substance Abuse Care:    Self-Mutilation:  Denies  Suicidal Attempts:    Homicidal Behaviors:  Denies   Violent Behaviors:  Denies   Place of Residence:   Marital Status:   Employed/Unemployed:   Education:   Family Supports:   Objective: Blood pressure 166/95, pulse 85, temperature 98.5 F (36.9 C), resp. rate 20, SpO2 96.00%.There is no weight on file to calculate BMI. Results for orders placed during the hospital encounter of 08/12/13 (from the past 72 hour(s))  CBC WITH DIFFERENTIAL     Status: Abnormal   Collection Time    08/12/13 12:55 PM      Result Value Range   WBC 5.0  4.0 - 10.5 K/uL   RBC 4.30  4.22 - 5.81 MIL/uL   Hemoglobin 13.7  13.0 - 17.0 g/dL   HCT 40.9  81.1 - 91.4 %   MCV 92.6  78.0 - 100.0 fL   MCH 31.9  26.0 - 34.0 pg   MCHC 34.4  30.0 - 36.0 g/dL   RDW 78.2  95.6 - 21.3 %   Platelets 246  150 - 400 K/uL   Neutrophils Relative % 85 (*) 43 - 77 %   Neutro Abs 4.3  1.7 - 7.7 K/uL   Lymphocytes Relative 9 (*) 12 - 46 %   Lymphs Abs 0.4 (*) 0.7 - 4.0 K/uL   Monocytes Relative 6  3 - 12 %   Monocytes Absolute 0.3  0.1 - 1.0 K/uL   Eosinophils Relative 0  0 - 5 %   Eosinophils Absolute 0.0  0.0 - 0.7 K/uL   Basophils Relative 0  0 - 1 %   Basophils Absolute 0.0  0.0 - 0.1 K/uL  COMPREHENSIVE METABOLIC PANEL     Status: Abnormal   Collection Time    08/12/13 12:55 PM      Result Value Range   Sodium 142  135 - 145 mEq/L   Potassium 3.6  3.5 - 5.1 mEq/L   Chloride 104  96 - 112 mEq/L   CO2 27  19 - 32 mEq/L   Glucose, Bld 103 (*) 70 - 99 mg/dL   BUN 8  6 - 23 mg/dL   Creatinine, Ser 0.86  0.50 - 1.35 mg/dL   Calcium 8.7  8.4 - 57.8 mg/dL   Total Protein 6.2  6.0 - 8.3 g/dL   Albumin 3.5  3.5 - 5.2 g/dL   AST 36  0 - 37 U/L   ALT 17  0 - 53 U/L   Alkaline Phosphatase 81  39 - 117 U/L   Total Bilirubin 0.7  0.3 - 1.2 mg/dL   GFR calc non  Af Amer >90  >90 mL/min   GFR calc Af Amer >90  >90 mL/min   Comment: (NOTE)     The eGFR  has been calculated using the CKD EPI equation.     This calculation has not been validated in all clinical situations.     eGFR's persistently <90 mL/min signify possible Chronic Kidney     Disease.  ETHANOL     Status: None   Collection Time    08/12/13 12:55 PM      Result Value Range   Alcohol, Ethyl (B) <11  0 - 11 mg/dL   Comment:            LOWEST DETECTABLE LIMIT FOR     SERUM ALCOHOL IS 11 mg/dL     FOR MEDICAL PURPOSES ONLY  URINE RAPID DRUG SCREEN (HOSP PERFORMED)     Status: None   Collection Time    08/12/13  1:36 PM      Result Value Range   Opiates NONE DETECTED  NONE DETECTED   Cocaine NONE DETECTED  NONE DETECTED   Benzodiazepines NONE DETECTED  NONE DETECTED   Amphetamines NONE DETECTED  NONE DETECTED   Tetrahydrocannabinol NONE DETECTED  NONE DETECTED   Barbiturates NONE DETECTED  NONE DETECTED   Comment:            DRUG SCREEN FOR MEDICAL PURPOSES     ONLY.  IF CONFIRMATION IS NEEDED     FOR ANY PURPOSE, NOTIFY LAB     WITHIN 5 DAYS.                LOWEST DETECTABLE LIMITS     FOR URINE DRUG SCREEN     Drug Class       Cutoff (ng/mL)     Amphetamine      1000     Barbiturate      200     Benzodiazepine   200     Tricyclics       300     Opiates          300     Cocaine          300     THC              50     Current Facility-Administered Medications  Medication Dose Route Frequency Provider Last Rate Last Dose  . acetaminophen (TYLENOL) tablet 650 mg  650 mg Oral Q4H PRN Antony Madura, PA-C      . alum & mag hydroxide-simeth (MAALOX/MYLANTA) 200-200-20 MG/5ML suspension 30 mL  30 mL Oral PRN Antony Madura, PA-C      . ibuprofen (ADVIL,MOTRIN) tablet 600 mg  600 mg Oral Q8H PRN Antony Madura, PA-C      . LORazepam (ATIVAN) tablet 1 mg  1 mg Oral Q8H PRN Antony Madura, PA-C   1 mg at 08/12/13 1342  . nicotine (NICODERM CQ - dosed in mg/24 hours) patch 21 mg  21 mg Transdermal Daily Antony Madura, PA-C   21 mg at 08/12/13 1342  . ondansetron (ZOFRAN) tablet 4  mg  4 mg Oral Q8H PRN Antony Madura, PA-C      . zolpidem (AMBIEN) tablet 5 mg  5 mg Oral QHS PRN Antony Madura, PA-C       Current Outpatient Prescriptions  Medication Sig Dispense Refill  . acetaminophen (TYLENOL) 500 MG tablet Take 1 tablet (500 mg total) by mouth every 6 (six) hours as needed for pain  or fever.  30 tablet  0  . chlordiazePOXIDE (LIBRIUM) 25 MG capsule Take 1 capsule (25 mg total) by mouth 4 (four) times daily as needed (tremors/ withdrawl symptoms).  12 capsule  0  . divalproex (DEPAKOTE ER) 500 MG 24 hr tablet Take 1 tablet (500 mg total) by mouth daily. For stabilization  30 tablet  0  . gabapentin (NEURONTIN) 100 MG capsule Take 1 capsule (100 mg total) by mouth 3 (three) times daily. For anxiety/paim management  90 capsule  0  . ibuprofen (ADVIL,MOTRIN) 800 MG tablet Take 1 tablet (800 mg total) by mouth every 8 (eight) hours as needed for pain.  30 tablet  0  . Multiple Vitamins-Minerals (MULTIVITAMIN WITH MINERALS) tablet Take 1 tablet by mouth daily. For low vitamin      . traZODone (DESYREL) 50 MG tablet Take 1 tablet (50 mg total) by mouth at bedtime as needed and may repeat dose one time if needed for sleep.  60 tablet  0    Psychiatric Specialty Exam:     Blood pressure 166/95, pulse 85, temperature 98.5 F (36.9 C), resp. rate 20, SpO2 96.00%.There is no weight on file to calculate BMI.  General Appearance: Disheveled  Eye Contact::  Good  Speech:  Clear and Coherent and Normal Rate  Volume:  Normal  Mood:  Anxious  Affect:  Congruent  Thought Process:  "I came cause I'm having some trouble with my knee and stomach"  Orientation:  Full (Time, Place, and Person)  Thought Content:  WDL  Suicidal Thoughts:  No  Homicidal Thoughts:  No  Memory:  Immediate;   Good Recent;   Good Remote;   Good  Judgement:  Fair  Insight:  Fair  Psychomotor Activity:  Tremor  Concentration:  Fair  Recall:  Good  Akathisia:  No  Handed:  Right  AIMS (if indicated):      Assets:  Communication Skills Desire for Improvement Housing  Sleep:      Face to face consult/interview with Dr. Ladona Ridgel  Treatment Plan Summary: Patient cleared psychiatrically.  No criteria for inpatient treatment.     Disposition:  Patient can be moved back to (medical) ED or discharged home if EDP has found patient medically stable.  Give patient resource/contact information for long term rehab facility treatment.  Assunta Found, FNP-BC 08/12/2013 2:33 PM

## 2013-08-13 ENCOUNTER — Encounter: Payer: Self-pay | Admitting: Family Medicine

## 2013-08-14 NOTE — ED Provider Notes (Signed)
Medical screening examination/treatment/procedure(s) were performed by non-physician practitioner and as supervising physician I was immediately available for consultation/collaboration.  EKG Interpretation   None         Laray Anger, DO 08/14/13 1417

## 2013-08-17 NOTE — Consult Note (Signed)
Note reviewed and agreed with  

## 2013-08-20 ENCOUNTER — Telehealth (INDEPENDENT_AMBULATORY_CARE_PROVIDER_SITE_OTHER): Payer: Self-pay | Admitting: *Deleted

## 2013-08-20 NOTE — Telephone Encounter (Signed)
See message below °

## 2013-08-20 NOTE — Telephone Encounter (Signed)
Meredith from [facility] called and states that pt did no make it to his appt that was scheduled for yesterday 08/19/13.

## 2013-08-23 ENCOUNTER — Telehealth (INDEPENDENT_AMBULATORY_CARE_PROVIDER_SITE_OTHER): Payer: Self-pay

## 2013-08-23 NOTE — Telephone Encounter (Signed)
Called and left message for patient to call our office.  RE:  Missed appointment for Referrals to [facility] and follow up appointments with Dr. Johna Sheriff.

## 2013-09-15 ENCOUNTER — Encounter (HOSPITAL_COMMUNITY): Payer: Self-pay | Admitting: Emergency Medicine

## 2013-09-15 ENCOUNTER — Emergency Department (HOSPITAL_COMMUNITY): Payer: Medicaid Other

## 2013-09-15 ENCOUNTER — Emergency Department (HOSPITAL_COMMUNITY)
Admission: EM | Admit: 2013-09-15 | Discharge: 2013-09-15 | Disposition: A | Discharge status: Home / Self Care | Payer: Medicaid Other | Attending: Emergency Medicine | Admitting: Emergency Medicine

## 2013-09-15 DIAGNOSIS — R42 Dizziness and giddiness: Secondary | ICD-10-CM | POA: Insufficient documentation

## 2013-09-15 DIAGNOSIS — F17200 Nicotine dependence, unspecified, uncomplicated: Secondary | ICD-10-CM | POA: Insufficient documentation

## 2013-09-15 DIAGNOSIS — Z8781 Personal history of (healed) traumatic fracture: Secondary | ICD-10-CM | POA: Insufficient documentation

## 2013-09-15 DIAGNOSIS — S59919A Unspecified injury of unspecified forearm, initial encounter: Secondary | ICD-10-CM

## 2013-09-15 DIAGNOSIS — S59909A Unspecified injury of unspecified elbow, initial encounter: Secondary | ICD-10-CM | POA: Insufficient documentation

## 2013-09-15 DIAGNOSIS — Z79899 Other long term (current) drug therapy: Secondary | ICD-10-CM | POA: Insufficient documentation

## 2013-09-15 DIAGNOSIS — S8990XA Unspecified injury of unspecified lower leg, initial encounter: Secondary | ICD-10-CM | POA: Insufficient documentation

## 2013-09-15 DIAGNOSIS — R296 Repeated falls: Secondary | ICD-10-CM | POA: Insufficient documentation

## 2013-09-15 DIAGNOSIS — Z872 Personal history of diseases of the skin and subcutaneous tissue: Secondary | ICD-10-CM | POA: Insufficient documentation

## 2013-09-15 DIAGNOSIS — S6990XA Unspecified injury of unspecified wrist, hand and finger(s), initial encounter: Secondary | ICD-10-CM | POA: Insufficient documentation

## 2013-09-15 DIAGNOSIS — S99919A Unspecified injury of unspecified ankle, initial encounter: Secondary | ICD-10-CM

## 2013-09-15 DIAGNOSIS — Z8719 Personal history of other diseases of the digestive system: Secondary | ICD-10-CM | POA: Insufficient documentation

## 2013-09-15 DIAGNOSIS — Y929 Unspecified place or not applicable: Secondary | ICD-10-CM | POA: Insufficient documentation

## 2013-09-15 DIAGNOSIS — T148XXA Other injury of unspecified body region, initial encounter: Secondary | ICD-10-CM | POA: Insufficient documentation

## 2013-09-15 DIAGNOSIS — F319 Bipolar disorder, unspecified: Secondary | ICD-10-CM | POA: Insufficient documentation

## 2013-09-15 DIAGNOSIS — S99929A Unspecified injury of unspecified foot, initial encounter: Secondary | ICD-10-CM

## 2013-09-15 DIAGNOSIS — Y939 Activity, unspecified: Secondary | ICD-10-CM | POA: Insufficient documentation

## 2013-09-15 DIAGNOSIS — M1710 Unilateral primary osteoarthritis, unspecified knee: Secondary | ICD-10-CM | POA: Insufficient documentation

## 2013-09-15 DIAGNOSIS — IMO0002 Reserved for concepts with insufficient information to code with codable children: Secondary | ICD-10-CM

## 2013-09-15 LAB — URINALYSIS, ROUTINE W REFLEX MICROSCOPIC
GLUCOSE, UA: NEGATIVE mg/dL
HGB URINE DIPSTICK: NEGATIVE
Ketones, ur: NEGATIVE mg/dL
Leukocytes, UA: NEGATIVE
Nitrite: NEGATIVE
Protein, ur: NEGATIVE mg/dL
Specific Gravity, Urine: 1.011 (ref 1.005–1.030)
Urobilinogen, UA: 0.2 mg/dL (ref 0.0–1.0)
pH: 6 (ref 5.0–8.0)

## 2013-09-15 LAB — COMPREHENSIVE METABOLIC PANEL
ALK PHOS: 75 U/L (ref 39–117)
ALT: 22 U/L (ref 0–53)
AST: 45 U/L — AB (ref 0–37)
Albumin: 3.7 g/dL (ref 3.5–5.2)
BILIRUBIN TOTAL: 1.1 mg/dL (ref 0.3–1.2)
BUN: 14 mg/dL (ref 6–23)
CHLORIDE: 96 meq/L (ref 96–112)
CO2: 25 mEq/L (ref 19–32)
Calcium: 8.9 mg/dL (ref 8.4–10.5)
Creatinine, Ser: 0.83 mg/dL (ref 0.50–1.35)
GFR calc non Af Amer: >90 mL/min (ref >90)
GLUCOSE: 135 mg/dL — AB (ref 70–99)
Potassium: 3.3 mEq/L — ABNORMAL LOW (ref 3.7–5.3)
SODIUM: 136 meq/L — AB (ref 137–147)
Total Protein: 6.6 g/dL (ref 6.0–8.3)

## 2013-09-15 LAB — CBC WITH DIFFERENTIAL/PLATELET
BASOS ABS: 0 10*3/uL (ref 0.0–0.1)
Basophils Relative: 1 % (ref 0–1)
Eosinophils Absolute: 0 10*3/uL (ref 0.0–0.7)
Eosinophils Relative: 1 % (ref 0–5)
HEMATOCRIT: 44.5 % (ref 39.0–52.0)
Hemoglobin: 16 g/dL (ref 13.0–17.0)
Lymphocytes Relative: 9 % — ABNORMAL LOW (ref 12–46)
Lymphs Abs: 0.4 10*3/uL — ABNORMAL LOW (ref 0.7–4.0)
MCH: 33.1 pg (ref 26.0–34.0)
MCHC: 36 g/dL (ref 30.0–36.0)
MCV: 92.1 fL (ref 78.0–100.0)
MONO ABS: 0.3 10*3/uL (ref 0.1–1.0)
Monocytes Relative: 6 % (ref 3–12)
NEUTROS ABS: 4.2 10*3/uL (ref 1.7–7.7)
Neutrophils Relative %: 84 % — ABNORMAL HIGH (ref 43–77)
PLATELETS: 173 10*3/uL (ref 150–400)
RBC: 4.83 MIL/uL (ref 4.22–5.81)
RDW: 14 % (ref 11.5–15.5)
WBC: 5 10*3/uL (ref 4.0–10.5)

## 2013-09-15 LAB — CARBOXYHEMOGLOBIN
CARBOXYHEMOGLOBIN: 5.4 % — AB (ref 0.5–1.5)
METHEMOGLOBIN: 1.7 % — AB (ref 0.0–1.5)
O2 SAT: 81 %
Total hemoglobin: 16.7 g/dL (ref 13.5–18.0)

## 2013-09-15 LAB — VALPROIC ACID LEVEL: Valproic Acid Lvl: <10 ug/mL — ABNORMAL LOW (ref 50.0–100.0)

## 2013-09-15 NOTE — ED Provider Notes (Signed)
CSN: [id number]     Arrival date & time 09/15/13  1420 History   First MD Initiated Contact with Patient 09/15/13 1503     Chief Complaint  Patient presents with  . Dizziness   (Consider location/radiation/quality/duration/timing/severity/associated sxs/prior Treatment) HPI Comments: Patient presents to the ER for evaluation of multiple complaints. Patient reports that he has been feeling badly for more than a month. He feels weak and has had several falls. Patient reports that he hurt his left hand and wrist area as well as his left knee with one of the falls. It is nothing to have any significant head injury. He has not fallen some time.  Patient reports that he has had problems with dizziness. This is what has been causing falls. He does not have any specific headache associated with symptoms.  Patient is concerned because he has "nowhere to go and go". Patient reports that he has no heat in his home. He does have a wood burning stove, but says that he is having trouble keeping it going, so there has been no heat.  Patient is a 63 y.o. male presenting with dizziness.  Dizziness   Past Medical History  Diagnosis Date  . Osteoarthritis X years    left knee.  Distant hx (age 51) "dislocated" knee and required surgery, then crush injury to patella required knee cap removal.  . Diverticular disease     "itis" x 2 episodes (last was about 2007)  . Tobacco dependence   . Multiple rib fractures 10/09/10    s/p fall down a ravine--9th and 10th on right, contusion of left 7th and 8th ribs  . Fracture of metatarsal bone(s), closed     3rd, 4th, 5th mid shaft on left foot  . History of substance abuse     cocaine, marijuana, acid, alcohol (in remission since 2010)  . Solar dermatitis     face and ears  . Bipolar disorder     Has had multiple psych hospitalization in the past, most recently at [facility] 01/31/11-02/04/11.  Says meds made him emotionally blunted. (lamictal and  wellbutrin).  . Alcoholism     "Dry" since 2002; relapse 2014  . Depression    Past Surgical History  Procedure Laterality Date  . Knee dislocation surgery  1966  . Patellectomy  1968    s/p crush injury  . Inguinal hernia repair      left  . Hernia repair      at 64 years of age  57  . Total knee arthroplasty  11/20/2011    Procedure: TOTAL KNEE ARTHROPLASTY;  Surgeon: Ninetta Lights, MD;  Location: [facility];  Service: Orthopedics;  Laterality: Left;  DR Percell Miller WANTS 90MINUTES FOR THIS CASE  . Inguinal hernia repair  06/15/2012    Procedure: HERNIA REPAIR INGUINAL INCARCERATED;  Surgeon: Edward Jolly, MD;  Location: [facility];  Service: General;  Laterality: Right;  . Bowel resection  06/15/2012    Procedure: SMALL BOWEL RESECTION;  Surgeon: Edward Jolly, MD;  Location: [facility];  Service: General;  Laterality: N/A;  . Femur im nail  08/06/2012    Procedure: INTRAMEDULLARY (IM) NAIL FEMORAL;  Surgeon: Johnn Hai, MD;  Location: [facility];  Service: Orthopedics;  Laterality: Left;   Family History  Problem Relation Age of Onset  . Arthritis Mother   . Arthritis Father   . Cancer Brother     lung cancer.  Died in early 36s.   History  Substance Use Topics  . Smoking status: Current Every Day Smoker -- 1.00 packs/day for 30 years    Types: Cigarettes  . Smokeless tobacco: Never Used  . Alcohol Use: Yes     Comment: daily 6 beers and 1 pint of vodka    Review of Systems  Musculoskeletal: Positive for arthralgias.  Neurological: Positive for dizziness.  All other systems reviewed and are negative.    Allergies  Codeine  Home Medications   Current Outpatient Rx  Name  Route  Sig  Dispense  Refill  . acetaminophen (TYLENOL) 500 MG tablet   Oral   Take 1 tablet (500 mg total) by mouth every 6 (six) hours as needed for pain or fever.   30 tablet   0   . chlordiazePOXIDE (LIBRIUM) 25 MG capsule   Oral   Take 1 capsule (25 mg total) by mouth 4 (four) times  daily as needed (tremors/ withdrawl symptoms).   12 capsule   0   . divalproex (DEPAKOTE ER) 500 MG 24 hr tablet   Oral   Take 1 tablet (500 mg total) by mouth daily. For stabilization   30 tablet   0   . gabapentin (NEURONTIN) 100 MG capsule   Oral   Take 1 capsule (100 mg total) by mouth 3 (three) times daily. For anxiety/paim management   90 capsule   0   . ibuprofen (ADVIL,MOTRIN) 800 MG tablet   Oral   Take 1 tablet (800 mg total) by mouth every 8 (eight) hours as needed for pain.   30 tablet   0   . Multiple Vitamins-Minerals (MULTIVITAMIN WITH MINERALS) tablet   Oral   Take 1 tablet by mouth daily. For low vitamin         . traZODone (DESYREL) 50 MG tablet   Oral   Take 1 tablet (50 mg total) by mouth at bedtime as needed and may repeat dose one time if needed for sleep.   60 tablet   0    BP 176/110  Pulse 92  Temp(Src) 98.1 F (36.7 C)  Resp 20  SpO2 97% Physical Exam  Constitutional: He is oriented to person, place, and time. He appears well-developed and well-nourished. No distress.  HENT:  Head: Normocephalic and atraumatic.  Right Ear: Hearing normal.  Left Ear: Hearing normal.  Nose: Nose normal.  Mouth/Throat: Oropharynx is clear and moist and mucous membranes are normal.  Eyes: Conjunctivae and EOM are normal. Pupils are equal, round, and reactive to light.  Neck: Normal range of motion. Neck supple.  Cardiovascular: Regular rhythm, S1 normal and S2 normal.  Exam reveals no gallop and no friction rub.   No murmur heard. Pulmonary/Chest: Effort normal and breath sounds normal. No respiratory distress. He exhibits no tenderness.  Abdominal: Soft. Normal appearance and bowel sounds are normal. There is no hepatosplenomegaly. There is no tenderness. There is no rebound, no guarding, no tenderness at McBurney's point and negative Murphy's sign. No hernia.  Musculoskeletal: Normal range of motion.       Left wrist: He exhibits tenderness. He exhibits  normal range of motion, no swelling and no deformity.       Left knee: He exhibits normal range of motion, no swelling, no effusion and no deformity. Tenderness found.       Left hand: He exhibits tenderness. He exhibits no deformity. Normal sensation noted. Normal strength noted.       Hands: Neurological: He is alert and oriented to person,  place, and time. He has normal strength. No cranial nerve deficit or sensory deficit. Coordination normal. GCS eye subscore is 4. GCS verbal subscore is 5. GCS motor subscore is 6.  Skin: Skin is warm, dry and intact. No rash noted. No cyanosis.  Psychiatric: He has a normal mood and affect. His speech is normal and behavior is normal. Thought content normal.    ED Course  Procedures (including critical care time) Labs Review Labs Reviewed  CBC WITH DIFFERENTIAL - Abnormal; Notable for the following:    Neutrophils Relative % 84 (*)    Lymphocytes Relative 9 (*)    Lymphs Abs 0.4 (*)    All other components within normal limits  COMPREHENSIVE METABOLIC PANEL - Abnormal; Notable for the following:    Sodium 136 (*)    Potassium 3.3 (*)    Glucose, Bld 135 (*)    AST 45 (*)    All other components within normal limits  VALPROIC ACID LEVEL - Abnormal; Notable for the following:    Valproic Acid Lvl <10.0 (*)    All other components within normal limits  URINALYSIS, ROUTINE W REFLEX MICROSCOPIC - Abnormal; Notable for the following:    Bilirubin Urine SMALL (*)    All other components within normal limits  CARBOXYHEMOGLOBIN - Abnormal; Notable for the following:    Carboxyhemoglobin 5.4 (*)    Methemoglobin 1.7 (*)    All other components within normal limits   Imaging Review Dg Wrist Complete Left  09/15/2013   CLINICAL DATA:  Left wrist pain post fall  EXAM: LEFT WRIST - COMPLETE 3+ VIEW  COMPARISON:  None  FINDINGS: Five views of the left wrist submitted. No acute fracture or subluxation. Degenerative narrowing of radiocarpal joint.  Degenerative changes first carpal metacarpal joint.  IMPRESSION: No acute fracture or subluxation. Degenerative changes radiocarpal joint and first carpometacarpal joint.   Electronically Signed   By: Lahoma Crocker M.D.   On: 09/15/2013 15:33   Ct Head Wo Contrast  09/15/2013   CLINICAL DATA:  Dizziness and gait disturbance.  Weakness.  EXAM: CT HEAD WITHOUT CONTRAST  TECHNIQUE: Contiguous axial images were obtained from the base of the skull through the vertex without intravenous contrast.  COMPARISON:  CT head without contrast 08/06/2012  FINDINGS: Mild atrophy and white matter disease is stable compared to the prior exam. No hemorrhage or mass lesion is present. Ventricles are of normal size. No significant extra-axial fluid collection is evident. The paranasal sinuses and mastoid air cells are clear. The osseous skull is intact.  IMPRESSION: 1. Stable atrophy and white matter disease. 2. No acute intracranial abnormality.   Electronically Signed   By: Lawrence Santiago M.D.   On: 09/15/2013 15:42   Dg Knee Complete 4 Views Left  09/15/2013   CLINICAL DATA:  Fall several days ago, left knee pain  EXAM: LEFT KNEE - COMPLETE 4+ VIEW  COMPARISON:  Prior radiographs of the left knee 08/12/2013  FINDINGS: Surgical changes of a total knee arthroplasty without evidence of hardware complication. No significant soft tissue abnormality or knee joint effusion. The patella is absent. Trace atherosclerotic calcifications noted in the superficial femoral and popliteal arteries.  IMPRESSION: Stable radiographs of the left the without evidence of acute bony abnormality or joint effusion.   Electronically Signed   By: Jacqulynn Cadet M.D.   On: 09/15/2013 15:41   Dg Hand Complete Left  09/15/2013   CLINICAL DATA:  Fall several days ago, persistent pain in left wrist  radiating into left hand  EXAM: LEFT HAND - COMPLETE 3+ VIEW  COMPARISON:  Concurrently obtained radiographs of the left wrist  FINDINGS: No acute fracture or  malalignment. Degenerative osteoarthritis noted in the thumb carpometacarpal joint. Faint chondrocalcinosis noted in the region of the triangular fibrocartilage. No lytic or blastic osseous lesion. Soft tissues are unremarkable.  IMPRESSION: 1. No acute fracture or malalignment. 2. Moderately advanced thumb CMC joint osteoarthritis. 3. Mild chondrocalcinosis in the region of the triangular fibrocartilage.   Electronically Signed   By: Jacqulynn Cadet M.D.   On: 09/15/2013 15:39    EKG Interpretation    Date/Time:  Wednesday September 15 2013 15:52:23 EST Ventricular Rate:  84 PR Interval:  167 QRS Duration: 178 QT Interval:  422 QTC Calculation: 499 R Axis:   69 Text Interpretation:  Sinus rhythm Right bundle branch block No significant change since last tracing Confirmed by Arnold Depinto  MD, Macall Mccroskey ([id number]) on 09/15/2013 5:29:04 PM            MDM   1. Dizziness   2. Contusion    Patient presents to the ER for evaluation of multiple complaints. Patient is complaining of dizziness and falls. He does have a history of alcoholism, but reports that he has not drank since last month. Lab work was unremarkable. Head CT was performed and there is no evidence of subdural or other etiology to explain his symptoms. Because the patient does have burning stove, carbon monoxide poisoning was considered, but his monoxide level was only 5. This is not high enough to be symptomatic.  Believe the patient is here simply because he did not want to be at his house. He indicates that he has difficulty with heating in his home. Social work has been involved, and has arranged for his church to have somebody come and help him. He was also given the option to go to a shelter.     Orpah Greek, MD 09/15/13 1730

## 2013-09-15 NOTE — ED Notes (Signed)
Patient transported to CT 

## 2013-09-15 NOTE — ED Notes (Signed)
Pt c/o dizziness; nausea; hip and knee pain;

## 2013-09-15 NOTE — Progress Notes (Addendum)
CSW received consult from MD regarding patient needing heat. Pt shared that he does not have central heat, but uses a wood stove and an Counsellor. Pt states that the space heater does not work well. Patient stated that he is too weak to light the wood stove himself hwoever can place the wood in the stove. Pt shared that he went to [facility], and he went this morning to speak with the Rev. Dr. Kalman Shan. Pt gave CSW permission to speak with Rev. Dr. Kalman Shan about helping patient with lighting his wood stove. CSW spoke with Rev. Dr. Kalman Shan who was aware of patient need for heat, and that patient had stayed with a fellow church member on a previous night. Upon patient returning the member reported that the patient house was warm, however there was not much further dtail given if patient had another heat source at that time. Rev. Dr. Kalman Shan stated that she was try to get a church member to help with lighting patient stove and would call this writer back.   Rickey Barbara [phone]  ED CSW 09/15/2013 1603pm   CSW updated EDP who agreed with plan for dc home with assistance with heat. If nothing is arranged homeless shelter resources can be provided. CSW spoke with Fire department no resources available.   Dorathy Kinsman, LCSW [phone]  ED CSW .09/15/2013 1603pm

## 2013-09-15 NOTE — ED Notes (Signed)
MD at bedside. 

## 2013-09-15 NOTE — ED Notes (Signed)
Bus pass given- given resources

## 2013-09-15 NOTE — Discharge Instructions (Signed)

## 2013-10-31 DIAGNOSIS — E538 Deficiency of other specified B group vitamins: Secondary | ICD-10-CM

## 2013-10-31 HISTORY — DX: Deficiency of other specified B group vitamins: E53.8

## 2013-11-13 ENCOUNTER — Emergency Department (HOSPITAL_COMMUNITY)
Admission: EM | Admit: 2013-11-13 | Discharge: 2013-11-14 | Disposition: A | Discharge status: Home / Self Care | Payer: Medicare Other | Source: Home / Self Care | Attending: Emergency Medicine | Admitting: Emergency Medicine

## 2013-11-13 ENCOUNTER — Encounter (HOSPITAL_COMMUNITY): Payer: Self-pay | Admitting: Emergency Medicine

## 2013-11-13 ENCOUNTER — Emergency Department (HOSPITAL_COMMUNITY): Payer: Medicare Other

## 2013-11-13 DIAGNOSIS — F1910 Other psychoactive substance abuse, uncomplicated: Secondary | ICD-10-CM

## 2013-11-13 DIAGNOSIS — R51 Headache: Secondary | ICD-10-CM

## 2013-11-13 DIAGNOSIS — R071 Chest pain on breathing: Secondary | ICD-10-CM | POA: Insufficient documentation

## 2013-11-13 DIAGNOSIS — F10129 Alcohol abuse with intoxication, unspecified: Secondary | ICD-10-CM

## 2013-11-13 DIAGNOSIS — F17200 Nicotine dependence, unspecified, uncomplicated: Secondary | ICD-10-CM

## 2013-11-13 DIAGNOSIS — F411 Generalized anxiety disorder: Secondary | ICD-10-CM

## 2013-11-13 DIAGNOSIS — F1010 Alcohol abuse, uncomplicated: Secondary | ICD-10-CM

## 2013-11-13 DIAGNOSIS — F319 Bipolar disorder, unspecified: Secondary | ICD-10-CM | POA: Insufficient documentation

## 2013-11-13 DIAGNOSIS — M1990 Unspecified osteoarthritis, unspecified site: Secondary | ICD-10-CM | POA: Insufficient documentation

## 2013-11-13 DIAGNOSIS — R519 Headache, unspecified: Secondary | ICD-10-CM

## 2013-11-13 DIAGNOSIS — R0789 Other chest pain: Secondary | ICD-10-CM

## 2013-11-13 LAB — URINALYSIS, ROUTINE W REFLEX MICROSCOPIC
BILIRUBIN URINE: NEGATIVE
GLUCOSE, UA: NEGATIVE mg/dL
Hgb urine dipstick: NEGATIVE
KETONES UR: NEGATIVE mg/dL
Leukocytes, UA: NEGATIVE
NITRITE: NEGATIVE
Protein, ur: NEGATIVE mg/dL
Specific Gravity, Urine: 1.008 (ref 1.005–1.030)
Urobilinogen, UA: 0.2 mg/dL (ref 0.0–1.0)
pH: 6 (ref 5.0–8.0)

## 2013-11-13 LAB — COMPREHENSIVE METABOLIC PANEL
ALBUMIN: 3.6 g/dL (ref 3.5–5.2)
ALT: 13 U/L (ref 0–53)
AST: 29 U/L (ref 0–37)
Alkaline Phosphatase: 67 U/L (ref 39–117)
BUN: 11 mg/dL (ref 6–23)
CO2: 27 mEq/L (ref 19–32)
Calcium: 8.9 mg/dL (ref 8.4–10.5)
Chloride: 107 mEq/L (ref 96–112)
Creatinine, Ser: 0.88 mg/dL (ref 0.50–1.35)
GFR calc Af Amer: >90 mL/min (ref >90)
GFR calc non Af Amer: 88 mL/min — ABNORMAL LOW (ref >90)
GLUCOSE: 98 mg/dL (ref 70–99)
Potassium: 3.6 mEq/L — ABNORMAL LOW (ref 3.7–5.3)
SODIUM: 148 meq/L — AB (ref 137–147)
TOTAL PROTEIN: 6.5 g/dL (ref 6.0–8.3)
Total Bilirubin: 0.4 mg/dL (ref 0.3–1.2)

## 2013-11-13 LAB — CBC WITH DIFFERENTIAL/PLATELET
BASOS ABS: 0 10*3/uL (ref 0.0–0.1)
BASOS PCT: 1 % (ref 0–1)
EOS ABS: 0.1 10*3/uL (ref 0.0–0.7)
Eosinophils Relative: 1 % (ref 0–5)
HCT: 43.4 % (ref 39.0–52.0)
Hemoglobin: 15.2 g/dL (ref 13.0–17.0)
Lymphocytes Relative: 24 % (ref 12–46)
Lymphs Abs: 1.1 10*3/uL (ref 0.7–4.0)
MCH: 33.6 pg (ref 26.0–34.0)
MCHC: 35 g/dL (ref 30.0–36.0)
MCV: 95.8 fL (ref 78.0–100.0)
Monocytes Absolute: 0.3 10*3/uL (ref 0.1–1.0)
Monocytes Relative: 7 % (ref 3–12)
NEUTROS PCT: 68 % (ref 43–77)
Neutro Abs: 3.1 10*3/uL (ref 1.7–7.7)
PLATELETS: 196 10*3/uL (ref 150–400)
RBC: 4.53 MIL/uL (ref 4.22–5.81)
RDW: 13.8 % (ref 11.5–15.5)
WBC: 4.6 10*3/uL (ref 4.0–10.5)

## 2013-11-13 LAB — ETHANOL: Alcohol, Ethyl (B): 328 mg/dL — ABNORMAL HIGH (ref 0–11)

## 2013-11-13 LAB — RAPID URINE DRUG SCREEN, HOSP PERFORMED
Amphetamines: NOT DETECTED
BARBITURATES: NOT DETECTED
BENZODIAZEPINES: NOT DETECTED
COCAINE: NOT DETECTED
Opiates: NOT DETECTED
Tetrahydrocannabinol: NOT DETECTED

## 2013-11-13 LAB — CBG MONITORING, ED: Glucose-Capillary: 86 mg/dL (ref 70–99)

## 2013-11-13 MED ORDER — IBUPROFEN 800 MG PO TABS
800.0000 mg | ORAL_TABLET | Freq: Once | ORAL | Status: AC
  Administered 2013-11-13: 800 mg via ORAL
  Filled 2013-11-13: qty 1

## 2013-11-13 NOTE — ED Notes (Signed)
Per ems - patient has abdominal pain x 2 days. The patient has ETOH on board. The patient was wondering in an apartment complex that he does not live in. Alert - questionable orientation.

## 2013-11-13 NOTE — ED Notes (Signed)
Pt unable to answer questions appropriate,  Not sure if he lives alone or not

## 2013-11-13 NOTE — ED Notes (Signed)
Brought in by EMS   ,  Pt was in apartment complex laying in lawn,  Unable to walk,  [employer] was called but they called EMS  Because pt unable to go downtown to drunk tank,  Due to unable to ambulate so he was brought here,  States his stomach hurts,, denies alcohol use and says he walked here

## 2013-11-13 NOTE — ED Provider Notes (Signed)
CSN: [id number]     Arrival date & time 11/13/13  2109 History   First MD Initiated Contact with Patient 11/13/13 2130     Chief Complaint  Patient presents with  . Abdominal Pain  . Alcohol Intoxication     (Consider location/radiation/quality/duration/timing/severity/associated sxs/prior Treatment) HPI  Shawn [name] is a 63 y.o. male  with a hx of osteoarthritis, polysubstance abuse and EtOH abuse, bipolar disorder presents to the Emergency Department complaining of gradual, persistent, progressively worsening generalized throbbing headache onset 4 days ago.  He is also c/o depression but denies SI/HI.  Pt does not think he has auditory or visual hallucinations.   Pt reports he drinks a pint of liquor per day, but has only had a 1/2 pint today.  He denies drug use.  Nothing makes his symptoms better or worse.  EMS related a c/o abd pain, but pt denies this completely.  He does report some left sided chest pain and admits to falling today.  He has not taken any medications for his headache.  Pt denies fever, neck pain, neck stiffness, chest pain, SOB, abd pain, nausea, vomiting, diarrhea, weakness, syncope, dysuria.       Past Medical History  Diagnosis Date  . Osteoarthritis X years    left knee.  Distant hx (age 75) "dislocated" knee and required surgery, then crush injury to patella required knee cap removal.  . Diverticular disease     "itis" x 2 episodes (last was about 2007)  . Tobacco dependence   . Multiple rib fractures 10/09/10    s/p fall down a ravine--9th and 10th on right, contusion of left 7th and 8th ribs  . Fracture of metatarsal bone(s), closed     3rd, 4th, 5th mid shaft on left foot  . History of substance abuse     cocaine, marijuana, acid, alcohol (in remission since 2010)  . Solar dermatitis     face and ears  . Bipolar disorder     Has had multiple psych hospitalization in the past, most recently at [facility] 01/31/11-02/04/11.  Says meds made him  emotionally blunted. (lamictal and wellbutrin).  . Alcoholism     "Dry" since 2002; relapse 2014  . Depression    Past Surgical History  Procedure Laterality Date  . Knee dislocation surgery  1966  . Patellectomy  1968    s/p crush injury  . Inguinal hernia repair      left  . Hernia repair      at 63 years of age  44  . Total knee arthroplasty  11/20/2011    Procedure: TOTAL KNEE ARTHROPLASTY;  Surgeon: Ninetta Lights, MD;  Location: [facility];  Service: Orthopedics;  Laterality: Left;  DR Percell Miller WANTS 90MINUTES FOR THIS CASE  . Inguinal hernia repair  06/15/2012    Procedure: HERNIA REPAIR INGUINAL INCARCERATED;  Surgeon: Edward Jolly, MD;  Location: [facility];  Service: General;  Laterality: Right;  . Bowel resection  06/15/2012    Procedure: SMALL BOWEL RESECTION;  Surgeon: Edward Jolly, MD;  Location: [facility];  Service: General;  Laterality: N/A;  . Femur im nail  08/06/2012    Procedure: INTRAMEDULLARY (IM) NAIL FEMORAL;  Surgeon: Johnn Hai, MD;  Location: [facility];  Service: Orthopedics;  Laterality: Left;   Family History  Problem Relation Age of Onset  . Arthritis Mother   . Arthritis Father   . Cancer Brother     lung cancer.  Died in  early 37s.   History  Substance Use Topics  . Smoking status: Current Every Day Smoker -- 1.00 packs/day for 30 years    Types: Cigarettes  . Smokeless tobacco: Never Used  . Alcohol Use: Yes     Comment: daily 6 beers and 1 pint of vodka    Review of Systems  Constitutional: Negative for fever, diaphoresis, appetite change, fatigue and unexpected weight change.  HENT: Negative for mouth sores.   Eyes: Negative for visual disturbance.  Respiratory: Negative for cough, chest tightness, shortness of breath and wheezing.   Cardiovascular: Positive for chest pain.  Gastrointestinal: Negative for nausea, vomiting, abdominal pain, diarrhea and constipation.  Endocrine: Negative for polydipsia, polyphagia and polyuria.    Genitourinary: Negative for dysuria, urgency, frequency and hematuria.  Musculoskeletal: Negative for back pain and neck stiffness.  Skin: Negative for rash.  Allergic/Immunologic: Negative for immunocompromised state.  Neurological: Positive for headaches. Negative for syncope and light-headedness.  Hematological: Does not bruise/bleed easily.  Psychiatric/Behavioral: Negative for sleep disturbance. The patient is not nervous/anxious.       Allergies  Codeine  Home Medications   Current Outpatient Rx  Name  Route  Sig  Dispense  Refill  . divalproex (DEPAKOTE ER) 500 MG 24 hr tablet   Oral   Take 1 tablet (500 mg total) by mouth daily. For stabilization   30 tablet   0    BP 117/68  Pulse 71  Temp(Src) 97.3 F (36.3 C) (Oral)  Resp 18  SpO2 93% Physical Exam  Nursing note and vitals reviewed. Constitutional: He is oriented to person, place, and time. He appears well-developed and well-nourished. No distress.  Awake, alert, nontoxic appearance  HENT:  Head: Normocephalic and atraumatic.  Mouth/Throat: Oropharynx is clear and moist. No oropharyngeal exudate.  Eyes: Conjunctivae are normal. No scleral icterus.  Neck: Normal range of motion. Neck supple.  Cardiovascular: Normal rate, regular rhythm, normal heart sounds and intact distal pulses.   No murmur heard. RRR  Pulmonary/Chest: Effort normal and breath sounds normal. No accessory muscle usage. Not tachypneic. No respiratory distress. He has no decreased breath sounds. He has no wheezes. He has no rhonchi. He has no rales. He exhibits tenderness (left sided).    Clear and equal breath sounds  Abdominal: Soft. Bowel sounds are normal. He exhibits no distension and no mass. There is no tenderness. There is no rebound and no guarding.  Abs soft and nontender  Musculoskeletal: Normal range of motion. He exhibits no edema.  Lymphadenopathy:    He has no cervical adenopathy.  Neurological: He is alert and oriented  to person, place, and time. He exhibits normal muscle tone. Coordination normal.  Speech is clear and goal oriented, follows commands Major Cranial nerves without deficit, no facial droop Normal strength in upper and lower extremities bilaterally including dorsiflexion and plantar flexion, strong and equal grip strength Sensation normal to light and sharp touch Moves extremities without ataxia, coordination intact Normal finger to nose and rapid alternating movements Normal gait and balance  Skin: Skin is warm and dry. He is not diaphoretic. No erythema.  Psychiatric: He has a normal mood and affect.    ED Course  Procedures (including critical care time) Labs Review Labs Reviewed  COMPREHENSIVE METABOLIC PANEL - Abnormal; Notable for the following:    Sodium 148 (*)    Potassium 3.6 (*)    GFR calc non Af Amer 88 (*)    All other components within normal limits  ETHANOL -  Abnormal; Notable for the following:    Alcohol, Ethyl (B) 328 (*)    All other components within normal limits  CBC WITH DIFFERENTIAL  URINE RAPID DRUG SCREEN (HOSP PERFORMED)  URINALYSIS, ROUTINE W REFLEX MICROSCOPIC  CBG MONITORING, ED   Imaging Review Dg Chest 1 View  11/13/2013   CLINICAL DATA:  Altered mental status.  Alcohol  EXAM: CHEST - 1 VIEW  COMPARISON:  11/13/2011  FINDINGS: The heart size and mediastinal contours are within normal limits. Both lungs are clear. The visualized skeletal structures are unremarkable.  IMPRESSION: No active disease.   Electronically Signed   By: Franchot Gallo M.D.   On: 11/13/2013 22:19     EKG Interpretation None      MDM   Final diagnoses:  Alcohol abuse with intoxication  Headache  Left-sided chest wall pain   Markus Jarvis presents with alcohol intoxication. He reports several days of a generalized throbbing headache. His a normal neurologic exam without focal neurologic deficits. He also reports left-sided chest pain which is reproducible on  palpation. No crepitus or flail segment. Per EMS he had fallen prior to arrival.  No evidence of pneumothorax. X-ray pending.  11:55 AM An ambulatory without difficulty. He reports pain has improved in his breathing without difficulty.  Patient is alert and oriented, nontoxic nonseptic appearing. Patient clinically sober at this time and requesting discharge.  Labs reassuring. Chest x-ray without evidence of rib fractures, pneumothorax, pulmonary edema or pneumonia.  I personally reviewed the imaging tests through PACS system  I reviewed available ER/hospitalization records through the EMR  It has been determined that no acute conditions requiring further emergency intervention are present at this time. The patient/guardian have been advised of the diagnosis and plan. We have discussed signs and symptoms that warrant return to the ED, such as changes or worsening in symptoms.   Vital signs are stable at discharge.   BP 117/68  Pulse 71  Temp(Src) 97.3 F (36.3 C) (Oral)  Resp 18  SpO2 93%  Patient/guardian has voiced understanding and agreed to follow-up with the PCP or specialist.    .   Abigail Butts, PA-C 11/14/13 [phone]

## 2013-11-14 ENCOUNTER — Encounter (HOSPITAL_COMMUNITY): Payer: Self-pay | Admitting: Emergency Medicine

## 2013-11-14 ENCOUNTER — Emergency Department (HOSPITAL_COMMUNITY)
Admission: EM | Admit: 2013-11-14 | Discharge: 2013-11-14 | Disposition: A | Discharge status: Home / Self Care | Payer: Medicare Other | Source: Home / Self Care | Attending: Emergency Medicine | Admitting: Emergency Medicine

## 2013-11-14 ENCOUNTER — Emergency Department (HOSPITAL_COMMUNITY): Payer: Medicare Other

## 2013-11-14 DIAGNOSIS — W19XXXA Unspecified fall, initial encounter: Secondary | ICD-10-CM

## 2013-11-14 DIAGNOSIS — F1010 Alcohol abuse, uncomplicated: Secondary | ICD-10-CM

## 2013-11-14 DIAGNOSIS — M25551 Pain in right hip: Secondary | ICD-10-CM

## 2013-11-14 DIAGNOSIS — S20212A Contusion of left front wall of thorax, initial encounter: Secondary | ICD-10-CM

## 2013-11-14 LAB — COMPREHENSIVE METABOLIC PANEL
ALK PHOS: 71 U/L (ref 39–117)
ALT: 15 U/L (ref 0–53)
AST: 39 U/L — AB (ref 0–37)
Albumin: 3.9 g/dL (ref 3.5–5.2)
BILIRUBIN TOTAL: 0.8 mg/dL (ref 0.3–1.2)
BUN: 13 mg/dL (ref 6–23)
CHLORIDE: 106 meq/L (ref 96–112)
CO2: 26 mEq/L (ref 19–32)
Calcium: 8.9 mg/dL (ref 8.4–10.5)
Creatinine, Ser: 0.98 mg/dL (ref 0.50–1.35)
GFR calc Af Amer: >90 mL/min (ref >90)
GFR calc non Af Amer: 84 mL/min — ABNORMAL LOW (ref >90)
Glucose, Bld: 86 mg/dL (ref 70–99)
POTASSIUM: 3.7 meq/L (ref 3.7–5.3)
SODIUM: 148 meq/L — AB (ref 137–147)
TOTAL PROTEIN: 6.5 g/dL (ref 6.0–8.3)

## 2013-11-14 LAB — CBC WITH DIFFERENTIAL/PLATELET
Basophils Absolute: 0 10*3/uL (ref 0.0–0.1)
Basophils Relative: 0 % (ref 0–1)
Eosinophils Absolute: 0 10*3/uL (ref 0.0–0.7)
Eosinophils Relative: 0 % (ref 0–5)
HEMATOCRIT: 43.3 % (ref 39.0–52.0)
Hemoglobin: 14.9 g/dL (ref 13.0–17.0)
Lymphocytes Relative: 8 % — ABNORMAL LOW (ref 12–46)
Lymphs Abs: 0.5 10*3/uL — ABNORMAL LOW (ref 0.7–4.0)
MCH: 33.3 pg (ref 26.0–34.0)
MCHC: 34.4 g/dL (ref 30.0–36.0)
MCV: 96.7 fL (ref 78.0–100.0)
MONO ABS: 0.6 10*3/uL (ref 0.1–1.0)
Monocytes Relative: 8 % (ref 3–12)
Neutro Abs: 5.4 10*3/uL (ref 1.7–7.7)
Neutrophils Relative %: 83 % — ABNORMAL HIGH (ref 43–77)
Platelets: 180 10*3/uL (ref 150–400)
RBC: 4.48 MIL/uL (ref 4.22–5.81)
RDW: 13.8 % (ref 11.5–15.5)
WBC: 6.5 10*3/uL (ref 4.0–10.5)

## 2013-11-14 LAB — ETHANOL: Alcohol, Ethyl (B): 58 mg/dL — ABNORMAL HIGH (ref 0–11)

## 2013-11-14 LAB — VALPROIC ACID LEVEL: Valproic Acid Lvl: <10 ug/mL — ABNORMAL LOW (ref 50.0–100.0)

## 2013-11-14 MED ORDER — NAPROXEN 375 MG PO TABS: 375.0000 mg | ORAL_TABLET | Freq: Two times a day (BID) | ORAL | Status: DC

## 2013-11-14 MED ORDER — ONDANSETRON 8 MG PO TBDP: 8.0000 mg | ORAL_TABLET | Freq: Once | ORAL | Status: DC

## 2013-11-14 MED ORDER — TRAMADOL HCL 50 MG PO TABS: 100.0000 mg | ORAL_TABLET | Freq: Four times a day (QID) | ORAL | Status: DC | PRN

## 2013-11-14 NOTE — ED Provider Notes (Signed)
CSN: [id number]     Arrival date & time 11/14/13  [id number] History   First MD Initiated Contact with Patient 11/14/13 [phone]     Chief Complaint  Patient presents with  . Hip Pain    Right     (Consider location/radiation/quality/duration/timing/severity/associated sxs/prior Treatment) HPI Patient presents to the emergency department via EMS for complaints of right hip pain. Patient had just left the ED approximately midnight. He does not seem to recall he was in the ED. He reports he has been having balance problems that have gotten gradually worse over the past month. He states he fell and hit his left side of his chest  about 11 PM last night however he was in the ED from 9 PM to midnight. When I point this out to him he looks at me confused. He states that the pain is burning and is constant. He grabs his chest when he coughs. He denies shortness of breath. He states he just started coughing last night and is coughing up yellow sputum. He's unaware of fever.  He also complains of right hip pain "for a while" which is a few weeks. He states the pain is sharp. He reports it started after a fall. He also reports he is now starting to have pain in his left hip. He also reports is hard for him to use his left leg. The pain is described as sharp. Patient also reports he fell and hit the back of his head a couple days ago. He has some headache and tenderness in that area. He states he is having worsening of blurred vision.  Patient reports he has had an alcohol problem for a long time. He states his last detox was a few years ago. He reports the longest he's ever been sober was a year.  Patient has history of bipolar disease. He reports he is not taking his Depakote on a regular basis. He states he did take it tonight.   PCP Dr Eppie Gibson in [place] [address] [facility] in [place]  Past Medical History  Diagnosis Date  . Osteoarthritis X years    left knee.  Distant hx (age 22) "dislocated"  knee and required surgery, then crush injury to patella required knee cap removal.  . Diverticular disease     "itis" x 2 episodes (last was about 2007)  . Tobacco dependence   . Multiple rib fractures 10/09/10    s/p fall down a ravine--9th and 10th on right, contusion of left 7th and 8th ribs  . Fracture of metatarsal bone(s), closed     3rd, 4th, 5th mid shaft on left foot  . History of substance abuse     cocaine, marijuana, acid, alcohol (in remission since 2010)  . Solar dermatitis     face and ears  . Bipolar disorder     Has had multiple psych hospitalization in the past, most recently at [facility] 01/31/11-02/04/11.  Says meds made him emotionally blunted. (lamictal and wellbutrin).  . Alcoholism     "Dry" since 2002; relapse 2014  . Depression    Past Surgical History  Procedure Laterality Date  . Knee dislocation surgery  1966  . Patellectomy  1968    s/p crush injury  . Inguinal hernia repair      left  . Hernia repair      at 63 years of age  34  . Total knee arthroplasty  11/20/2011    Procedure: TOTAL KNEE ARTHROPLASTY;  Surgeon: Ninetta Lights, MD;  Location: [facility];  Service: Orthopedics;  Laterality: Left;  DR Percell Miller WANTS 90MINUTES FOR THIS CASE  . Inguinal hernia repair  06/15/2012    Procedure: HERNIA REPAIR INGUINAL INCARCERATED;  Surgeon: Edward Jolly, MD;  Location: [facility];  Service: General;  Laterality: Right;  . Bowel resection  06/15/2012    Procedure: SMALL BOWEL RESECTION;  Surgeon: Edward Jolly, MD;  Location: [facility];  Service: General;  Laterality: N/A;  . Femur im nail  08/06/2012    Procedure: INTRAMEDULLARY (IM) NAIL FEMORAL;  Surgeon: Johnn Hai, MD;  Location: [facility];  Service: Orthopedics;  Laterality: Left;   Family History  Problem Relation Age of Onset  . Arthritis Mother   . Arthritis Father   . Cancer Brother     lung cancer.  Died in early 60s.   History  Substance Use Topics  . Smoking status: Current  Every Day Smoker -- 1.00 packs/day for 30 years    Types: Cigarettes  . Smokeless tobacco: Never Used  . Alcohol Use: Yes     Comment: daily 6 beers and 1 pint of vodka  lives in a small house Lives alone On disability for bipolar and ? Back   Review of Systems  All other systems reviewed and are negative.      Allergies  Codeine  Home Medications   Current Outpatient Rx  Name  Route  Sig  Dispense  Refill  . divalproex (DEPAKOTE ER) 500 MG 24 hr tablet   Oral   Take 1 tablet (500 mg total) by mouth daily. For stabilization   30 tablet   0   . naproxen (NAPROSYN) 375 MG tablet   Oral   Take 1 tablet (375 mg total) by mouth 2 (two) times daily.   20 tablet   0   . traMADol (ULTRAM) 50 MG tablet   Oral   Take 2 tablets (100 mg total) by mouth every 6 (six) hours as needed.   16 tablet   0    BP 137/85  Pulse 70  Temp(Src) 98.1 F (36.7 C) (Oral)  Resp 18  Ht 6' (1.829 m)  Wt 165 lb (74.844 kg)  BMI 22.37 kg/m2  SpO2 95%  Vital signs normal   Physical Exam  Nursing note and vitals reviewed. Constitutional: He is oriented to person, place, and time. He appears well-developed and well-nourished.  Non-toxic appearance. He does not appear ill. No distress.  HENT:  Head: Normocephalic and atraumatic.    Right Ear: External ear normal.  Left Ear: External ear normal.  Nose: Nose normal. No mucosal edema or rhinorrhea.  Mouth/Throat: Oropharynx is clear and moist and mucous membranes are normal. No dental abscesses or uvula swelling.  Pt indicates his headache is in the posterior occipital area (see diagram), no bruising, swelling or tenderness to palpation  Eyes: Conjunctivae and EOM are normal. Pupils are equal, round, and reactive to light.  Neck: Normal range of motion and full passive range of motion without pain. Neck supple.  Cardiovascular: Normal rate, regular rhythm and normal heart sounds.  Exam reveals no gallop and no friction rub.   No murmur  heard. Pulmonary/Chest: Effort normal and breath sounds normal. No respiratory distress. He has no wheezes. He has no rhonchi. He has no rales. He exhibits no tenderness and no crepitus.    Patient tender in his left lateral rib cage and when he coughs he grabs his chest  Abdominal: Soft. Normal appearance and bowel sounds are normal. He exhibits no distension. There is no tenderness. There is no rebound and no guarding.  Musculoskeletal: Normal range of motion. He exhibits no edema and no tenderness.  Pt has no pain to palpation in his back. He has pain in his lateral right hip. He also has some pain in his left hip. PT has difficulty lifting both legs off the stretcher b/o pain in his hip. He has mildly weaker left grips more than just being right handed. Pt has a mild tremor.   Neurological: He is alert and oriented to person, place, and time. He has normal strength. No cranial nerve deficit.  Skin: Skin is warm, dry and intact. No rash noted. No erythema. No pallor.  Psychiatric: His affect is blunt. His speech is delayed and slurred. He is slowed.    ED Course  Procedures (including critical care time)  Patient is ambulatory in his room without limping. When I went to discuss his test results patient had no idea he had had a fracture of his left hip and had had surgery. He does remember the hernia surgery prior to the hip surgery in the same month. On review of his chart it was in December 2013. He was in the hospital for one week. Patient had me show him his scar on his leg. Surgery was done by Dr Tonita Cong.     Labs Review  Results for orders placed during the hospital encounter of 11/14/13  ETHANOL      Result Value Ref Range   Alcohol, Ethyl (B) 58 (*) 0 - 11 mg/dL  VALPROIC ACID LEVEL      Result Value Ref Range   Valproic Acid Lvl <10.0 (*) 50.0 - 100.0 ug/mL  CBC WITH DIFFERENTIAL      Result Value Ref Range   WBC 6.5  4.0 - 10.5 K/uL   RBC 4.48  4.22 - 5.81 MIL/uL    Hemoglobin 14.9  13.0 - 17.0 g/dL   HCT 43.3  39.0 - 52.0 %   MCV 96.7  78.0 - 100.0 fL   MCH 33.3  26.0 - 34.0 pg   MCHC 34.4  30.0 - 36.0 g/dL   RDW 13.8  11.5 - 15.5 %   Platelets 180  150 - 400 K/uL   Neutrophils Relative % 83 (*) 43 - 77 %   Neutro Abs 5.4  1.7 - 7.7 K/uL   Lymphocytes Relative 8 (*) 12 - 46 %   Lymphs Abs 0.5 (*) 0.7 - 4.0 K/uL   Monocytes Relative 8  3 - 12 %   Monocytes Absolute 0.6  0.1 - 1.0 K/uL   Eosinophils Relative 0  0 - 5 %   Eosinophils Absolute 0.0  0.0 - 0.7 K/uL   Basophils Relative 0  0 - 1 %   Basophils Absolute 0.0  0.0 - 0.1 K/uL  COMPREHENSIVE METABOLIC PANEL      Result Value Ref Range   Sodium 148 (*) 137 - 147 mEq/L   Potassium 3.7  3.7 - 5.3 mEq/L   Chloride 106  96 - 112 mEq/L   CO2 26  19 - 32 mEq/L   Glucose, Bld 86  70 - 99 mg/dL   BUN 13  6 - 23 mg/dL   Creatinine, Ser 0.98  0.50 - 1.35 mg/dL   Calcium 8.9  8.4 - 10.5 mg/dL   Total Protein 6.5  6.0 - 8.3 g/dL   Albumin 3.9  3.5 - 5.2 g/dL   AST 39 (*) 0 - 37 U/L   ALT 15  0 - 53 U/L   Alkaline Phosphatase 71  39 - 117 U/L   Total Bilirubin 0.8  0.3 - 1.2 mg/dL   GFR calc non Af Amer 84 (*) >90 mL/min   GFR calc Af Amer >90  >90 mL/min    Laboratory interpretation all normal except except mildly elevated sodium, patient now sober, patient has minor elevation of 1 LFT consistent with his history of alcohol use   Results for orders placed during the hospital encounter of 11/13/13  CBC WITH DIFFERENTIAL      Result Value Ref Range   WBC 4.6  4.0 - 10.5 K/uL   RBC 4.53  4.22 - 5.81 MIL/uL   Hemoglobin 15.2  13.0 - 17.0 g/dL   HCT 43.4  39.0 - 52.0 %   MCV 95.8  78.0 - 100.0 fL   MCH 33.6  26.0 - 34.0 pg   MCHC 35.0  30.0 - 36.0 g/dL   RDW 13.8  11.5 - 15.5 %   Platelets 196  150 - 400 K/uL   Neutrophils Relative % 68  43 - 77 %   Neutro Abs 3.1  1.7 - 7.7 K/uL   Lymphocytes Relative 24  12 - 46 %   Lymphs Abs 1.1  0.7 - 4.0 K/uL   Monocytes Relative 7  3 - 12 %    Monocytes Absolute 0.3  0.1 - 1.0 K/uL   Eosinophils Relative 1  0 - 5 %   Eosinophils Absolute 0.1  0.0 - 0.7 K/uL   Basophils Relative 1  0 - 1 %   Basophils Absolute 0.0  0.0 - 0.1 K/uL  COMPREHENSIVE METABOLIC PANEL      Result Value Ref Range   Sodium 148 (*) 137 - 147 mEq/L   Potassium 3.6 (*) 3.7 - 5.3 mEq/L   Chloride 107  96 - 112 mEq/L   CO2 27  19 - 32 mEq/L   Glucose, Bld 98  70 - 99 mg/dL   BUN 11  6 - 23 mg/dL   Creatinine, Ser 0.88  0.50 - 1.35 mg/dL   Calcium 8.9  8.4 - 10.5 mg/dL   Total Protein 6.5  6.0 - 8.3 g/dL   Albumin 3.6  3.5 - 5.2 g/dL   AST 29  0 - 37 U/L   ALT 13  0 - 53 U/L   Alkaline Phosphatase 67  39 - 117 U/L   Total Bilirubin 0.4  0.3 - 1.2 mg/dL   GFR calc non Af Amer 88 (*) >90 mL/min   GFR calc Af Amer >90  >90 mL/min  ETHANOL      Result Value Ref Range   Alcohol, Ethyl (B) 328 (*) 0 - 11 mg/dL  URINE RAPID DRUG SCREEN (HOSP PERFORMED)      Result Value Ref Range   Opiates NONE DETECTED  NONE DETECTED   Cocaine NONE DETECTED  NONE DETECTED   Benzodiazepines NONE DETECTED  NONE DETECTED   Amphetamines NONE DETECTED  NONE DETECTED   Tetrahydrocannabinol NONE DETECTED  NONE DETECTED   Barbiturates NONE DETECTED  NONE DETECTED  URINALYSIS, ROUTINE W REFLEX MICROSCOPIC      Result Value Ref Range   Color, Urine YELLOW  YELLOW   APPearance CLEAR  CLEAR   Specific Gravity, Urine 1.008  1.005 - 1.030   pH 6.0  5.0 - 8.0   Glucose,  UA NEGATIVE  NEGATIVE mg/dL   Hgb urine dipstick NEGATIVE  NEGATIVE   Bilirubin Urine NEGATIVE  NEGATIVE   Ketones, ur NEGATIVE  NEGATIVE mg/dL   Protein, ur NEGATIVE  NEGATIVE mg/dL   Urobilinogen, UA 0.2  0.0 - 1.0 mg/dL   Nitrite NEGATIVE  NEGATIVE   Leukocytes, UA NEGATIVE  NEGATIVE  CBG MONITORING, ED      Result Value Ref Range   Glucose-Capillary 86  70 - 99 mg/dL      Imaging Review    Dg Ribs Unilateral W/chest Left  11/14/2013   CLINICAL DATA:  Chest pain following injury  EXAM: LEFT RIBS  AND CHEST - 3+ VIEW  COMPARISON:  None.  FINDINGS: No fracture or other bone lesions are seen involving the ribs. There is no evidence of pneumothorax or pleural effusion. Both lungs are clear. Heart size and mediastinal contours are within normal limits.  IMPRESSION: No acute abnormality noted.   Electronically Signed   By: Inez Catalina M.D.   On: 11/14/2013 08:39   Dg Hip Bilateral W/pelvis  11/14/2013   CLINICAL DATA:  Hip pain  EXAM: BILATERAL HIP WITH PELVIS - 4+ VIEW  COMPARISON:  MR HIP*L* W/O CM dated 08/06/2012; DG HIP OPERATIVE*L* dated 08/06/2012  FINDINGS: Dynamic left femoral nail in place. Bones are subjectively osteopenic. No displaced pelvic fracture is identified. Both femoral heads appear properly located. No proximal femoral fracture is identified on either side.  IMPRESSION: No fracture or dislocation identified.   Electronically Signed   By: Conchita Paris M.D.   On: 11/14/2013 08:45   Ct Head Wo Contrast  11/14/2013   CLINICAL DATA:  Headache, fall  EXAM: CT HEAD WITHOUT CONTRAST  TECHNIQUE: Contiguous axial images were obtained from the base of the skull through the vertex without intravenous contrast.  COMPARISON:  CT HEAD W/O CM dated 09/15/2013  FINDINGS: Mild cortical volume loss noted with proportional ventricular prominence. Areas of periventricular white matter hypodensity are most compatible with small vessel ischemic change. No acute hemorrhage, infarct, or mass lesion is identified. No midline shift. Orbits are grossly unremarkable. Mild ethmoid mucoperiosteal thickening noted. No skull fracture.  IMPRESSION: No acute intracranial finding.  Remote findings as above.   Electronically Signed   By: Conchita Paris M.D.   On: 11/14/2013 08:27    Dg Chest 1 View  11/13/2013   CLINICAL DATA:  Altered mental status.  Alcohol  .  IMPRESSION: No active disease.   Electronically Signed   By: Franchot Gallo M.D.   On: 11/13/2013 22:19     EKG Interpretation None      MDM    patient has alcoholism and comes in with some right hip pain and left chest wall pain from falling. He is in no respiratory distress. He has no evidence or signs of pneumonia. He is ambulatory without limping while in the ED. His x-rays were reviewed with the patient. He is going to followup with his PCP. Head CT was done due to his history of alcoholism and falls and feeling off balance. Most likely his balance problems are related to when he is intoxicated.    Final diagnoses:  Hip pain, right  Alcohol abuse  Contusion of left chest wall  Falls    New Prescriptions   NAPROXEN (NAPROSYN) 375 MG TABLET    Take 1 tablet (375 mg total) by mouth 2 (two) times daily.   TRAMADOL (ULTRAM) 50 MG TABLET    Take 2 tablets (100  mg total) by mouth every 6 (six) hours as needed.  acetminophen  Plan discharge  Rolland Porter, MD, Alanson Aly, MD 11/14/13 [phone]

## 2013-11-14 NOTE — ED Provider Notes (Signed)
  Medical screening examination/treatment/procedure(s) were performed by non-physician practitioner and as supervising physician I was immediately available for consultation/collaboration.       Carmin Muskrat, MD 11/14/13 [phone]

## 2013-11-14 NOTE — ED Notes (Signed)
Bed: HE52 Expected date: 11/14/13 Expected time: 6:24 AM Means of arrival: Ambulance Comments: Hip pain/just discharged from here 2 hours ago

## 2013-11-14 NOTE — Discharge Instructions (Signed)
You need to see Dr Rolena Infante this week to discuss your hip pain. Take the medications as prescribed with acetaminophen 650 mg 4 times a day for pain.

## 2013-11-14 NOTE — ED Notes (Signed)
Pt sitting in a chair by his bed at present.  Reports feeling nauseous.

## 2013-11-14 NOTE — ED Notes (Signed)
He has recently returned from x-ray.  I obtained his blood sample.  He then stood and used urinal--his urine is clear and amber and normal in appearance.  His speech is clear and he is oriented x 3 (not time/day/date).

## 2013-11-14 NOTE — Discharge Instructions (Signed)
1. Medications: usual home medications 2. Treatment: rest, drink plenty of fluids,  3. Follow Up: Please followup with your primary doctor for discussion of your diagnoses and further evaluation after today's visit;     Alcohol Intoxication Alcohol intoxication occurs when the amount of alcohol that a person has consumed impairs his or her ability to mentally and physically function. Alcohol directly impairs the normal chemical activity of the brain. Drinking large amounts of alcohol can lead to changes in mental function and behavior, and it can cause many physical effects that can be harmful.  Alcohol intoxication can range in severity from mild to very severe. Various factors can affect the level of intoxication that occurs, such as the person's age, gender, weight, frequency of alcohol consumption, and the presence of other medical conditions (such as diabetes, seizures, or heart conditions). Dangerous levels of alcohol intoxication may occur when people drink large amounts of alcohol in a short period (binge drinking). Alcohol can also be especially dangerous when combined with certain prescription medicines or "recreational" drugs. SIGNS AND SYMPTOMS Some common signs and symptoms of mild alcohol intoxication include:  Loss of coordination.  Changes in mood and behavior.  Impaired judgment.  Slurred speech. As alcohol intoxication progresses to more severe levels, other signs and symptoms will appear. These may include:  Vomiting.  Confusion and impaired memory.  Slowed breathing.  Seizures.  Loss of consciousness. DIAGNOSIS  Your health care provider will take a medical history and perform a physical exam. You will be asked about the amount and type of alcohol you have consumed. Blood tests will be done to measure the concentration of alcohol in your blood. In many places, your blood alcohol level must be lower than 80 mg/dL (0.08%) to legally drive. However, many dangerous  effects of alcohol can occur at much lower levels.  TREATMENT  People with alcohol intoxication often do not require treatment. Most of the effects of alcohol intoxication are temporary, and they go away as the alcohol naturally leaves the body. Your health care provider will monitor your condition until you are stable enough to go home. Fluids are sometimes given through an IV access tube to help prevent dehydration.  HOME CARE INSTRUCTIONS  Do not drive after drinking alcohol.  Stay hydrated. Drink enough water and fluids to keep your urine clear or pale yellow. Avoid caffeine.   Only take over-the-counter or prescription medicines as directed by your health care provider.  SEEK MEDICAL CARE IF:   You have persistent vomiting.   You do not feel better after a few days.  You have frequent alcohol intoxication. Your health care provider can help determine if you should see a substance use treatment counselor. SEEK IMMEDIATE MEDICAL CARE IF:   You become shaky or tremble when you try to stop drinking.   You shake uncontrollably (seizure).   You throw up (vomit) blood. This may be bright red or may look like black coffee grounds.   You have blood in your stool. This may be bright red or may appear as a black, tarry, bad smelling stool.   You become lightheaded or faint.  MAKE SURE YOU:   Understand these instructions.  Will watch your condition.  Will get help right away if you are not doing well or get worse. Document Released: 05/29/2005 Document Revised: 04/21/2013 Document Reviewed: 01/22/2013 [facility] Patient Information 2014 [place].

## 2013-11-14 NOTE — ED Notes (Signed)
Pt reports abd pain x 1 week with nausea.  Pt is alert and oriented at this time.

## 2013-11-14 NOTE — ED Notes (Signed)
Pt is not in his bed.  Left his drinks.  Assumed pt left without his discharge instructions.  Barbee Cough.  EDPA aware

## 2013-11-14 NOTE — ED Notes (Signed)
Patient states after leaving our facility during the night he has had multiple falls. C/O of R hip pain (ongoing) and left sided pain. Patient smells of ETOH and Cigarettes. Patient hospital bracelet from prior visit removed and placed in shred bin. Fall risk bracelet applied to R wrist.

## 2013-11-14 NOTE — ED Notes (Signed)
He ambulates without difficulty.  We inform him that our doctor will speak with him shortly.

## 2013-11-14 NOTE — ED Notes (Addendum)
Per EMS, patient walked to EMS base and requested transport for Right hip pain. Patient was seen earlier this evening for same complaint and left AMA.

## 2013-11-16 ENCOUNTER — Emergency Department (HOSPITAL_COMMUNITY): Payer: Medicare Other

## 2013-11-16 ENCOUNTER — Encounter (HOSPITAL_COMMUNITY): Payer: Self-pay | Admitting: Emergency Medicine

## 2013-11-16 ENCOUNTER — Emergency Department (EMERGENCY_DEPARTMENT_HOSPITAL)
Admission: EM | Admit: 2013-11-16 | Discharge: 2013-11-17 | Disposition: A | Discharge status: Other Acute Inpatient Hospital | Payer: Medicare Other | Source: Home / Self Care | Attending: Emergency Medicine | Admitting: Emergency Medicine

## 2013-11-16 DIAGNOSIS — R296 Repeated falls: Secondary | ICD-10-CM

## 2013-11-16 DIAGNOSIS — F1020 Alcohol dependence, uncomplicated: Secondary | ICD-10-CM

## 2013-11-16 DIAGNOSIS — F3160 Bipolar disorder, current episode mixed, unspecified: Secondary | ICD-10-CM

## 2013-11-16 DIAGNOSIS — R9431 Abnormal electrocardiogram [ECG] [EKG]: Secondary | ICD-10-CM

## 2013-11-16 DIAGNOSIS — F32A Depression, unspecified: Secondary | ICD-10-CM

## 2013-11-16 DIAGNOSIS — S72002A Fracture of unspecified part of neck of left femur, initial encounter for closed fracture: Secondary | ICD-10-CM

## 2013-11-16 DIAGNOSIS — R45851 Suicidal ideations: Secondary | ICD-10-CM

## 2013-11-16 DIAGNOSIS — F1010 Alcohol abuse, uncomplicated: Secondary | ICD-10-CM

## 2013-11-16 DIAGNOSIS — S2232XA Fracture of one rib, left side, initial encounter for closed fracture: Secondary | ICD-10-CM

## 2013-11-16 DIAGNOSIS — F329 Major depressive disorder, single episode, unspecified: Secondary | ICD-10-CM

## 2013-11-16 DIAGNOSIS — R748 Abnormal levels of other serum enzymes: Secondary | ICD-10-CM

## 2013-11-16 DIAGNOSIS — W19XXXA Unspecified fall, initial encounter: Secondary | ICD-10-CM

## 2013-11-16 HISTORY — DX: Anxiety disorder, unspecified: F41.9

## 2013-11-16 LAB — RAPID URINE DRUG SCREEN, HOSP PERFORMED
Amphetamines: NOT DETECTED
BENZODIAZEPINES: NOT DETECTED
Barbiturates: NOT DETECTED
COCAINE: NOT DETECTED
Opiates: NOT DETECTED
Tetrahydrocannabinol: NOT DETECTED

## 2013-11-16 LAB — COMPREHENSIVE METABOLIC PANEL
ALT: 23 U/L (ref 0–53)
AST: 55 U/L — AB (ref 0–37)
Albumin: 4.2 g/dL (ref 3.5–5.2)
Alkaline Phosphatase: 85 U/L (ref 39–117)
BUN: 9 mg/dL (ref 6–23)
CALCIUM: 9.5 mg/dL (ref 8.4–10.5)
CO2: 27 mEq/L (ref 19–32)
CREATININE: 0.76 mg/dL (ref 0.50–1.35)
Chloride: 100 mEq/L (ref 96–112)
GFR calc non Af Amer: >90 mL/min (ref >90)
GLUCOSE: 93 mg/dL (ref 70–99)
Potassium: 3.3 mEq/L — ABNORMAL LOW (ref 3.7–5.3)
SODIUM: 144 meq/L (ref 137–147)
Total Bilirubin: 0.9 mg/dL (ref 0.3–1.2)
Total Protein: 7.5 g/dL (ref 6.0–8.3)

## 2013-11-16 LAB — CBC WITH DIFFERENTIAL/PLATELET
BASOS ABS: 0 10*3/uL (ref 0.0–0.1)
Basophils Relative: 1 % (ref 0–1)
EOS ABS: 0.1 10*3/uL (ref 0.0–0.7)
EOS PCT: 1 % (ref 0–5)
HCT: 48.7 % (ref 39.0–52.0)
Hemoglobin: 17.3 g/dL — ABNORMAL HIGH (ref 13.0–17.0)
Lymphocytes Relative: 14 % (ref 12–46)
Lymphs Abs: 0.6 10*3/uL — ABNORMAL LOW (ref 0.7–4.0)
MCH: 33.9 pg (ref 26.0–34.0)
MCHC: 35.5 g/dL (ref 30.0–36.0)
MCV: 95.3 fL (ref 78.0–100.0)
Monocytes Absolute: 0.4 10*3/uL (ref 0.1–1.0)
Monocytes Relative: 8 % (ref 3–12)
Neutro Abs: 3.3 10*3/uL (ref 1.7–7.7)
Neutrophils Relative %: 76 % (ref 43–77)
PLATELETS: 196 10*3/uL (ref 150–400)
RBC: 5.11 MIL/uL (ref 4.22–5.81)
RDW: 13.6 % (ref 11.5–15.5)
WBC: 4.4 10*3/uL (ref 4.0–10.5)

## 2013-11-16 LAB — URINALYSIS, ROUTINE W REFLEX MICROSCOPIC
GLUCOSE, UA: NEGATIVE mg/dL
Hgb urine dipstick: NEGATIVE
Ketones, ur: NEGATIVE mg/dL
LEUKOCYTES UA: NEGATIVE
Nitrite: NEGATIVE
PH: 6 (ref 5.0–8.0)
Protein, ur: NEGATIVE mg/dL
Specific Gravity, Urine: 1.02 (ref 1.005–1.030)
Urobilinogen, UA: 1 mg/dL (ref 0.0–1.0)

## 2013-11-16 LAB — VALPROIC ACID LEVEL

## 2013-11-16 LAB — ACETAMINOPHEN LEVEL: Acetaminophen (Tylenol), Serum: <15 ug/mL (ref 10–30)

## 2013-11-16 LAB — ETHANOL: Alcohol, Ethyl (B): 45 mg/dL — ABNORMAL HIGH (ref 0–11)

## 2013-11-16 LAB — SALICYLATE LEVEL: Salicylate Lvl: <2 mg/dL — ABNORMAL LOW (ref 2.8–20.0)

## 2013-11-16 MED ORDER — ACETAMINOPHEN 325 MG PO TABS
650.0000 mg | ORAL_TABLET | Freq: Four times a day (QID) | ORAL | Status: DC | PRN
  Administered 2013-11-16 – 2013-11-17 (×2): 650 mg via ORAL
  Filled 2013-11-16 (×2): qty 2

## 2013-11-16 MED ORDER — LORAZEPAM 1 MG PO TABS: 0.0000 mg | ORAL_TABLET | Freq: Two times a day (BID) | ORAL | Status: DC

## 2013-11-16 MED ORDER — LORAZEPAM 1 MG PO TABS
0.0000 mg | ORAL_TABLET | Freq: Four times a day (QID) | ORAL | Status: DC
  Administered 2013-11-16 – 2013-11-17 (×3): 1 mg via ORAL
  Filled 2013-11-16 (×3): qty 1

## 2013-11-16 MED ORDER — NICOTINE 21 MG/24HR TD PT24
21.0000 mg | MEDICATED_PATCH | Freq: Every day | TRANSDERMAL | Status: DC
  Administered 2013-11-16 – 2013-11-17 (×2): 21 mg via TRANSDERMAL
  Filled 2013-11-16 (×2): qty 1

## 2013-11-16 NOTE — Progress Notes (Signed)
11/16/2013 A. Babyboy Loya [facility] 2156pm Discussed patient with EDP.

## 2013-11-16 NOTE — Consult Note (Signed)
[facility] Medical Consultation  Shawn [name]:[id number] DOB: November 22, 1950 DOA: 11/16/2013 PCP: Tammi Sou, MD   Requesting physician: Vernie Murders, PA-C Date of consultation: 11/16/13 Reason for consultation: Possible admission for syncope  Impression/Recommendations Principal Problem:   Fall Active Problems:   Alcohol dependence    1. Fall - After reviewing history with the patient he states he suffered a mechanical fall without syncope.  He definitely did not have LOC prior to the fall, states that he has loss of balance that has been going on for a while which is what caused the fall.  I suspect that his loss of balance is a result of Wernicke encephalopathy (recommend B12 and folate replacement).  Because he did not loose consciousness before or during the fall, do not feel that syncope admission is indicated at this point.  Also he scores a 0 on the SFSR. 2. EtOH dependence - sounds like plan is for patient is to go through detox and psych evaluation as he also had suicidal thoughts prior to this admit.    Chief Complaint: Hip pain  HPI:  [name] is a 63 yo M who presents to the ED with c/o recurrent falls, EtOH detox, and chronic loss of balance.  Apparently before coming to the ED today he had a fall.  He states that he remembers the entire event.  Specifically he stepped off of a curb, fell backwards and landed on his R hip.  He states the fall did not occur as a result of LOC, he "may have blacked out for a second after the fall because of the pain".  LOC did not occur before or during the fall he states.  He was able [place] after the [place] where EMS was called for ED transport.  Review of Systems:  12 systems reviewed and otherwise negative.  Past Medical History  Diagnosis Date  . Osteoarthritis X years    left knee.  Distant hx (age 24) "dislocated" knee and required surgery, then crush injury to patella required  knee cap removal.  . Diverticular disease     "itis" x 2 episodes (last was about 2007)  . Tobacco dependence   . Multiple rib fractures 10/09/10    s/p fall down a ravine--9th and 10th on right, contusion of left 7th and 8th ribs  . Fracture of metatarsal bone(s), closed     3rd, 4th, 5th mid shaft on left foot  . History of substance abuse     cocaine, marijuana, acid, alcohol (in remission since 2010)  . Solar dermatitis     face and ears  . Bipolar disorder     Has had multiple psych hospitalization in the past, most recently at [facility] 01/31/11-02/04/11.  Says meds made him emotionally blunted. (lamictal and wellbutrin).  . Alcoholism     "Dry" since 2002; relapse 2014  . Depression    Past Surgical History  Procedure Laterality Date  . Knee dislocation surgery  1966  . Patellectomy  1968    s/p crush injury  . Inguinal hernia repair      left  . Hernia repair      at 63 years of age  86  . Total knee arthroplasty  11/20/2011    Procedure: TOTAL KNEE ARTHROPLASTY;  Surgeon: Ninetta Lights, MD;  Location: [facility];  Service: Orthopedics;  Laterality: Left;  DR Percell Miller WANTS 90MINUTES FOR THIS CASE  . Inguinal hernia repair  06/15/2012    Procedure: HERNIA REPAIR INGUINAL INCARCERATED;  Surgeon: Edward Jolly, MD;  Location: [facility];  Service: General;  Laterality: Right;  . Bowel resection  06/15/2012    Procedure: SMALL BOWEL RESECTION;  Surgeon: Edward Jolly, MD;  Location: [facility];  Service: General;  Laterality: N/A;  . Femur im nail  08/06/2012    Procedure: INTRAMEDULLARY (IM) NAIL FEMORAL;  Surgeon: Johnn Hai, MD;  Location: [facility];  Service: Orthopedics;  Laterality: Left;   Social History:  reports that he has been smoking Cigarettes.  He has a 30 pack-year smoking history. He has never used smokeless tobacco. He reports that he drinks alcohol. He reports that he does not use illicit drugs.  Allergies  Allergen Reactions  . Codeine Nausea Only    Family History  Problem Relation Age of Onset  . Arthritis Mother   . Arthritis Father   . Cancer Brother     lung cancer.  Died in early 68s.    Prior to Admission medications   Medication Sig Start Date End Date Taking? Authorizing Provider  divalproex (DEPAKOTE ER) 500 MG 24 hr tablet Take 1 tablet (500 mg total) by mouth daily. For stabilization 04/15/13  Yes Encarnacion Slates, NP  naproxen (NAPROSYN) 375 MG tablet Take 1 tablet (375 mg total) by mouth 2 (two) times daily. 11/14/13  Yes Janice Norrie, MD  traMADol (ULTRAM) 50 MG tablet Take 2 tablets (100 mg total) by mouth every 6 (six) hours as needed. 11/14/13  Yes Janice Norrie, MD   Physical Exam: Blood pressure 161/79, pulse 78, temperature 97.8 F (36.6 C), temperature source Oral, resp. rate 16, SpO2 99.00%. Filed Vitals:   11/16/13 2016  BP: 161/79  Pulse: 78  Temp:   Resp: 16    General:  NAD, resting comfortably in bed Eyes: PEERLA EOMI ENT: mucous membranes moist Neck: supple w/o JVD Cardiovascular: RRR w/o MRG Respiratory: CTA B Abdomen: soft, nt, nd, bs+ Skin: no rash nor lesion Musculoskeletal: MAE, full ROM all 4 extremities, Bruising noted to L elbow from recent falls. Psychiatric: normal tone and affect Neurologic: AAOx3, grossly non-focal  Labs on Admission:  Basic Metabolic Panel:  Recent Labs Lab 11/13/13 2149 11/14/13 0815 11/16/13 1810  NA 148* 148* 144  K 3.6* 3.7 3.3*  CL 107 106 100  CO2 27 26 27   GLUCOSE 98 86 93  BUN 11 13 9   CREATININE 0.88 0.98 0.76  CALCIUM 8.9 8.9 9.5   Liver Function Tests:  Recent Labs Lab 11/13/13 2149 11/14/13 0815 11/16/13 1810  AST 29 39* 55*  ALT 13 15 23   ALKPHOS 67 71 85  BILITOT 0.4 0.8 0.9  PROT 6.5 6.5 7.5  ALBUMIN 3.6 3.9 4.2   No results found for this basename: LIPASE, AMYLASE,  in the last 168 hours No results found for this basename: AMMONIA,  in the last 168 hours CBC:  Recent Labs Lab 11/13/13 2149 11/14/13 0815 11/16/13 1810   WBC 4.6 6.5 4.4  NEUTROABS 3.1 5.4 3.3  HGB 15.2 14.9 17.3*  HCT 43.4 43.3 48.7  MCV 95.8 96.7 95.3  PLT 196 180 196   Cardiac Enzymes: No results found for this basename: CKTOTAL, CKMB, CKMBINDEX, TROPONINI,  in the last 168 hours BNP: No components found with this basename: POCBNP,  CBG:  Recent Labs Lab 11/13/13 2215  GLUCAP 86    Radiological Exams on Admission: Dg Ribs Unilateral W/chest Left  11/16/2013  CLINICAL DATA:  Left anterior rib pain  EXAM: LEFT RIBS AND CHEST - 3+ VIEW  COMPARISON:  DG RIBS UNILATERAL W/CHEST*L* dated 11/14/2013  FINDINGS: There is a nondisplaced fracture of the left anterolateral ninth rib. There is no evidence of pneumothorax or pleural effusion. Both lungs are clear. The lungs are hyperinflated likely secondary to COPD. Heart size and mediastinal contours are within normal limits.  IMPRESSION: Nondisplaced fracture of the left anterolateral ninth rib.   Electronically Signed   By: Kathreen Devoid   On: 11/16/2013 18:07   Dg Hip Bilateral W/pelvis  11/16/2013   CLINICAL DATA:  Bilateral hip pain, fall  EXAM: BILATERAL HIP WITH PELVIS - 4+ VIEW  COMPARISON:  11/14/2013  FINDINGS: IM nail with compression screw in proximal left femur post ORIF.  Hardware intact.  Diffuse osseous demineralization.  Mild narrowing of the hip joints bilaterally.  Slight narrowing of left SI joint versus right, unchanged.  No acute fracture, dislocation or bone destruction.  IMPRESSION: Osseous demineralization with mild degenerative changes of the hip joints and evidence of prior left hip ORIF.  No acute abnormalities.   Electronically Signed   By: Lavonia Dana M.D.   On: 11/16/2013 18:06   Ct Head Wo Contrast  11/16/2013   CLINICAL DATA:  Multiple falls this week  EXAM: CT HEAD WITHOUT CONTRAST  TECHNIQUE: Contiguous axial images were obtained from the base of the skull through the vertex without intravenous contrast.  COMPARISON:  CT HEAD W/O CM dated 11/14/2013; CT HEAD W/O CM  dated 09/15/2013  FINDINGS: No hemorrhage or extra-axial fluid. No skull fracture. Moderate diffuse atrophy an low attenuation in the deep white matter. Tiny lacunar infarct right frontoparietal region above the level of the ventricles image number 21, measuring less than a cm, stable. No vascular territory infarct.  IMPRESSION: Chronic involutional change.  No acute findings.   Electronically Signed   By: Skipper Cliche M.D.   On: 11/16/2013 18:10    EKG: Independently reviewed.  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 [facility] Pager [phone]  If 7PM-7AM, please contact night-coverage [email or web address] Password [facility] 11/16/2013, 9:06 PM

## 2013-11-16 NOTE — ED Notes (Signed)
Per EMS- Patient was seen in the ED 2 days ago for right hip pain and today the patient c/o increased pain to the right hip and pain now in the left hip as well. Patient oriented to self, but confused to date and time and situation.

## 2013-11-16 NOTE — Progress Notes (Signed)
   CARE MANAGEMENT ED NOTE 11/16/2013  Patient:  Shawn [name], Shawn [name]   Account Number:  [id number]  Date Initiated:  11/16/2013  Documentation initiated by:  Livia Snellen  Subjective/Objective Assessment:   patient presents to Ed post fall.  Patient reports having SI withiut a plan.     Subjective/Objective Assessment Detail:     Action/Plan:   Action/Plan Detail:   Anticipated DC Date:       Status Recommendation to Physician:   Result of Recommendation:    Other ED [place]  Other  PCP issues    Choice offered to / List presented to:  C-1 Patient          Status of service:  Completed, signed off  ED Comments:   ED Comments Detail:  EDCM spoke to patient at bedside.  As per patient, "I live in a little house behind a big house."  Patient lives alone.  Patient reports he has a daughter who lives in [place], a son that lives in [place] and a sister. Patient reports he last saw Dr. Ricardo Jericho, "A few months ago."  Patient reports he was seeing Dr. Eppie Gibson before that.  EDCM unable to locate this provider. Patient reports the only piece of medical equipment he has at home is a cane.  Patient reports he is in no need of any more medical equipment. Patient reported feeling unsafe returning to home because he kept, "passing out."  Patient has Medicare and Medicaid insurance.  EDCM provided patient with a list of home health agencies in [facility], list of private duty nursing services and information regarding [facility] [place].  Also informed patient that he may be eligibel for [place]. Patient stated, "I can use all the help I can get." Patient interested in home health services on discharge. Please contact EDCM for discharge planning. Patient thankful for resources.  No further EDCM needs at this time.

## 2013-11-16 NOTE — ED Notes (Signed)
Paatient reports that he fell off of a curb 2 days ago and right hip pain is worse aand is now having left hip pain and left rib cage area pain.

## 2013-11-16 NOTE — BH Assessment (Signed)
Tele Assessment Note   Shawn [name] is a 63 y.o. male who presents voluntarily to [facility] due to complications from a fall that happened today.  Pt sustained visible bruises and cuts to bilateral arms from a previous fall and today, he is also c/o hip and rib pain.  Per notes from examining PA, states that pt is a poor historian, however he is able to answer most questions posed by this writer w/o difficulty but  he is confused on some dates and times.  Pt confirms that he is SI w/no current plan or intent to harm self--"I don't have a plan yet."  When asked fif he feels safe returning home, he says--"I may eventually hurt myself."  Pt lives alone and tells this write that he has no support from family, though he has a sister who lives in town.  Pt states he's been having SI thoughts for approx 2-3 mos due to the changes in his health--he has knee problems, falls, negative changes due to falls.  He has no past hx of SI attempts but   Pt endorses depressive sxs: insomnia(2-3 hrs sleep, daily); not eating(25 pf wt loss); isolation and anhedonia.  Pt is grooming less due to because of hand tremors that he says started 5 days ago--"I was going to shave today, but I was afraid."  Pt reports hearing voices for several years with command to harm self--"I heard voices this morning.".  Pt admits he has a problems with alcohol, consuming 1 pint of alcohol, 2-3x's a week, states last time he drank was 2 days ago.  He denies he had any alcohol today, but BAL was 45.  Pt is visibly anxious and restless during the interview with this Probation officer.  He is ambulatory but has an unsteady gait, he has hold to railings to steady himself.  Pt is pleasant, cooperative and soft spoken.  Pt has past inpt admission in [place], [place], approx 20 yrs ago after his separation from his wife--"I just lost it."  Pt hx shows mutliple admits with other facilities.  He is currently seeking psychiatric services with Dr. Lenore Cordia.      Axis I: Bipolar I disorder, Current or most recent episode depressed, Severe; Alcohol use disorder, Moderate Axis II: Deferred Axis III:  Past Medical History  Diagnosis Date  . Osteoarthritis X years    left knee.  Distant hx (age 59) "dislocated" knee and required surgery, then crush injury to patella required knee cap removal.  . Diverticular disease     "itis" x 2 episodes (last was about 2007)  . Tobacco dependence   . Multiple rib fractures 10/09/10    s/p fall down a ravine--9th and 10th on right, contusion of left 7th and 8th ribs  . Fracture of metatarsal bone(s), closed     3rd, 4th, 5th mid shaft on left foot  . History of substance abuse     cocaine, marijuana, acid, alcohol (in remission since 2010)  . Solar dermatitis     face and ears  . Bipolar disorder     Has had multiple psych hospitalization in the past, most recently at [facility] 01/31/11-02/04/11.  Says meds made him emotionally blunted. (lamictal and wellbutrin).  . Alcoholism     "Dry" since 2002; relapse 2014  . Depression   . Anxiety    Axis IV: other psychosocial or environmental problems, problems related to social environment and problems with primary support group Axis V: 21-30  behavior considerably influenced by delusions or hallucinations OR serious impairment in judgment, communication OR inability to function in almost all areas  Past Medical History:  Past Medical History  Diagnosis Date  . Osteoarthritis X years    left knee.  Distant hx (age 41) "dislocated" knee and required surgery, then crush injury to patella required knee cap removal.  . Diverticular disease     "itis" x 2 episodes (last was about 2007)  . Tobacco dependence   . Multiple rib fractures 10/09/10    s/p fall down a ravine--9th and 10th on right, contusion of left 7th and 8th ribs  . Fracture of metatarsal bone(s), closed     3rd, 4th, 5th mid shaft on left foot  . History of substance abuse     cocaine,  marijuana, acid, alcohol (in remission since 2010)  . Solar dermatitis     face and ears  . Bipolar disorder     Has had multiple psych hospitalization in the past, most recently at [facility] 01/31/11-02/04/11.  Says meds made him emotionally blunted. (lamictal and wellbutrin).  . Alcoholism     "Dry" since 2002; relapse 2014  . Depression   . Anxiety     Past Surgical History  Procedure Laterality Date  . Knee dislocation surgery  1966  . Patellectomy  1968    s/p crush injury  . Inguinal hernia repair      left  . Hernia repair      at 63 years of age  57  . Total knee arthroplasty  11/20/2011    Procedure: TOTAL KNEE ARTHROPLASTY;  Surgeon: Ninetta Lights, MD;  Location: [facility];  Service: Orthopedics;  Laterality: Left;  DR Percell Miller WANTS 90MINUTES FOR THIS CASE  . Inguinal hernia repair  06/15/2012    Procedure: HERNIA REPAIR INGUINAL INCARCERATED;  Surgeon: Edward Jolly, MD;  Location: [facility];  Service: General;  Laterality: Right;  . Bowel resection  06/15/2012    Procedure: SMALL BOWEL RESECTION;  Surgeon: Edward Jolly, MD;  Location: [facility];  Service: General;  Laterality: N/A;  . Femur im nail  08/06/2012    Procedure: INTRAMEDULLARY (IM) NAIL FEMORAL;  Surgeon: Johnn Hai, MD;  Location: [facility];  Service: Orthopedics;  Laterality: Left;    Family History:  Family History  Problem Relation Age of Onset  . Arthritis Mother   . Arthritis Father   . Cancer Brother     lung cancer.  Died in early 45s.    Social History:  reports that he has been smoking Cigarettes.  He has a 30 pack-year smoking history. He has never used smokeless tobacco. He reports that he drinks alcohol. He reports that he does not use illicit drugs.  Additional Social History:  Alcohol / Drug Use Pain Medications: See MAR  Prescriptions: See MAR  Over the Counter: See MAR  History of alcohol / drug use?: Yes Longest period of sobriety (when/how long): None  Negative  Consequences of Use: Personal relationships Withdrawal Symptoms: Other (Comment) (No current w/d sxs ) Substance #1 Name of Substance 1: Alcohol  1 - Age of First Use: Teens  1 - Amount (size/oz): 1 Pint  1 - Frequency: 3 x's Wkly  1 - Duration: On-going  1 - Last Use / Amount: 2-3 days ago--BAL 45  CIWA: CIWA-Ar BP: 161/79 mmHg Pulse Rate: 78 Nausea and Vomiting: mild nausea with no vomiting Tactile Disturbances: none Tremor: two Auditory Disturbances: not present  Paroxysmal Sweats: no sweat visible Visual Disturbances: not present Anxiety: two Headache, Fullness in Head: very mild Agitation: normal activity Orientation and Clouding of Sensorium: oriented and can do serial additions CIWA-Ar Total: 6 COWS:    Allergies:  Allergies  Allergen Reactions  . Codeine Nausea Only    Home Medications:  (Not in a hospital admission)  OB/GYN Status:  No LMP for male patient.  General Assessment Data [facility]: [facility] Is this a Tele or Face-to-Face Assessment?: Tele Assessment Is this an Initial Assessment or a Re-assessment for this encounter?: Initial Assessment Living Arrangements: Alone Can pt return to current living arrangement?: Yes Admission Status: Voluntary Is patient capable of signing voluntary admission?: Yes Transfer from: [facility] Referral Source: MD  Medical Screening Exam ([place]) Medical Exam completed: No Reason for MSE not completed: Other: (None )  [place] Living Arrangements: Alone Name of Psychiatrist: [facility]  Name of Therapist: None   Education Status Is patient currently in school?: No Current Grade: None  Highest grade of school patient has completed: None  Name of school: None  Contact person: None   Risk to self Suicidal Ideation: Yes-Currently Present Suicidal Intent: No-Not Currently/Within Last 6 Months Is patient at risk for suicide?: Yes Suicidal Plan?: No-Not Currently/Within  Last 6 Months Access to Means: Yes Specify Access to Suicidal Means: Sharps, Pills  What has been your use of drugs/alcohol within the last 12 months?: Abusing: alcohol  Previous Attempts/Gestures: No How many times?: 0 Other Self Harm Risks: None  Triggers for Past Attempts: None known Intentional Self Injurious Behavior: None Family Suicide History: No Recent stressful life event(s): Recent negative physical changes;Other (Comment) (Mental health ) Persecutory voices/beliefs?: No Depression: Yes Depression Symptoms: Loss of interest in usual pleasures;Feeling worthless/self pity;Insomnia;Isolating Substance abuse history and/or treatment for substance abuse?: Yes Suicide prevention information given to non-admitted patients: Not applicable  Risk to Others Homicidal Ideation: No Thoughts of Harm to Others: No Current Homicidal Intent: No Current Homicidal Plan: No Access to Homicidal Means: No Identified Victim: None  History of harm to others?: No Assessment of Violence: None Noted Violent Behavior Description: None Does patient have access to weapons?: No Criminal Charges Pending?: No Does patient have a court date: No  Psychosis Hallucinations: Auditory;With command Delusions: None noted  Mental Status Report Appear/Hygiene: Disheveled Eye Contact: Fair Motor Activity: Unsteady;Tremors Speech: Logical/coherent;Soft Level of Consciousness: Alert Mood: Depressed;Anxious;Sad Affect: Anxious;Euphoric;Sad Anxiety Level: Moderate Thought Processes: Coherent;Relevant Judgement: Impaired Orientation: Person;Place;Time;Situation Obsessive Compulsive Thoughts/Behaviors: Minimal  Cognitive Functioning Concentration: Decreased Memory: Recent Impaired;Remote Intact IQ: Average Insight: Poor Impulse Control: Fair Appetite: Poor Weight Loss: 25 Weight Gain: 0 Sleep: Decreased Total Hours of Sleep: 2 Vegetative Symptoms: Decreased grooming  ADLScreening [facility]  Assessment Services) Patient's cognitive ability adequate to safely complete daily activities?: Yes Patient able to express need for assistance with ADLs?: Yes Independently performs ADLs?: No  Prior Inpatient Therapy Prior Inpatient Therapy: Yes Prior Therapy Dates: 20 yrs ago  Prior Therapy Facilty/Provider(s): [facility]  Reason for Treatment: Mental Healrh   Prior Outpatient Therapy Prior Outpatient Therapy: Yes Prior Therapy Dates: Current  Prior Therapy Facilty/Provider(s): [facility]  Reason for Treatment: Med Mgt   ADL Screening (condition at time of admission) Patient's cognitive ability adequate to safely complete daily activities?: Yes Is the patient deaf or have difficulty hearing?: No Does the patient have difficulty seeing, even when wearing glasses/contacts?: No Does the patient have difficulty concentrating, remembering, or making decisions?:  Yes Patient able to express need for assistance with ADLs?: Yes Does the patient have difficulty dressing or bathing?: Yes Independently performs ADLs?: No Communication: Independent Dressing (OT): Independent Grooming: Needs assistance Is this a change from baseline?: Pre-admission baseline Feeding: Independent Bathing: Needs assistance Is this a change from baseline?: Pre-admission baseline Toileting: Independent In/Out Bed: Independent Walks in Home: Needs assistance Is this a change from baseline?: Pre-admission baseline Does the patient have difficulty walking or climbing stairs?: Yes Weakness of Legs: Both Weakness of Arms/Hands: Both  Home Assistive Devices/Equipment Home Assistive Devices/Equipment: Eyeglasses  Therapy Consults (therapy consults require a physician order) PT Evaluation Needed: No OT Evalulation Needed: No SLP Evaluation Needed: No Abuse/Neglect Assessment (Assessment to be complete while patient is alone) Physical Abuse: Denies Verbal Abuse: Denies Sexual Abuse: Denies Exploitation  of patient/patient's resources: Denies Self-Neglect: Denies Values / Beliefs Cultural Requests During Hospitalization: None Spiritual Requests During Hospitalization: None Consults Spiritual Care Consult Needed: No Social Work Consult Needed: No Regulatory affairs officer (For Healthcare) Advance Directive: Patient does not have advance directive;Patient would not like information Pre-existing out of facility DNR order (yellow form or pink MOST form): No Nutrition Screen- [facility] Patient's home diet: Regular  Additional Information 1:1 In Past 12 Months?: No CIRT Risk: No Elopement Risk: No Does patient have medical clearance?: Yes     Disposition:  Disposition Initial Assessment Completed for this Encounter: Yes Disposition of Patient: Inpatient treatment program;Referred to (Pending psych referral and placement ) Type of inpatient treatment program: Adult Patient referred to: Other (Comment) (Pending psych referral and placement )  Girtha Rm 11/16/2013 11:39 PM

## 2013-11-16 NOTE — ED Notes (Signed)
Pt changed into blue paper scrubs. Security called to wand pt and pt's belongings.
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
## 2013-11-16 NOTE — ED Notes (Signed)
Security at bedside to wand pt and pt's belongings.

## 2013-11-16 NOTE — ED Notes (Signed)
PA at bedside.

## 2013-11-17 ENCOUNTER — Encounter (HOSPITAL_COMMUNITY): Payer: Self-pay | Admitting: Registered Nurse

## 2013-11-17 ENCOUNTER — Encounter (HOSPITAL_COMMUNITY): Payer: Self-pay | Admitting: *Deleted

## 2013-11-17 ENCOUNTER — Inpatient Hospital Stay (HOSPITAL_COMMUNITY)
Admission: AD | Admit: 2013-11-17 | Discharge: 2013-11-20 | DRG: 885 | Disposition: A | Discharge status: Home / Self Care | Payer: Medicare Other | Source: Intra-hospital | Attending: Psychiatry | Admitting: Psychiatry

## 2013-11-17 DIAGNOSIS — F32A Depression, unspecified: Secondary | ICD-10-CM | POA: Diagnosis present

## 2013-11-17 DIAGNOSIS — F3130 Bipolar disorder, current episode depressed, mild or moderate severity, unspecified: Secondary | ICD-10-CM

## 2013-11-17 DIAGNOSIS — F17200 Nicotine dependence, unspecified, uncomplicated: Secondary | ICD-10-CM | POA: Diagnosis present

## 2013-11-17 DIAGNOSIS — F329 Major depressive disorder, single episode, unspecified: Secondary | ICD-10-CM | POA: Diagnosis present

## 2013-11-17 DIAGNOSIS — Z79899 Other long term (current) drug therapy: Secondary | ICD-10-CM

## 2013-11-17 DIAGNOSIS — F1994 Other psychoactive substance use, unspecified with psychoactive substance-induced mood disorder: Secondary | ICD-10-CM

## 2013-11-17 DIAGNOSIS — F411 Generalized anxiety disorder: Secondary | ICD-10-CM | POA: Diagnosis present

## 2013-11-17 DIAGNOSIS — F1020 Alcohol dependence, uncomplicated: Secondary | ICD-10-CM | POA: Diagnosis present

## 2013-11-17 DIAGNOSIS — F3161 Bipolar disorder, current episode mixed, mild: Principal | ICD-10-CM | POA: Diagnosis present

## 2013-11-17 DIAGNOSIS — R45851 Suicidal ideations: Secondary | ICD-10-CM

## 2013-11-17 DIAGNOSIS — G4700 Insomnia, unspecified: Secondary | ICD-10-CM | POA: Diagnosis present

## 2013-11-17 DIAGNOSIS — F332 Major depressive disorder, recurrent severe without psychotic features: Secondary | ICD-10-CM

## 2013-11-17 DIAGNOSIS — R269 Unspecified abnormalities of gait and mobility: Secondary | ICD-10-CM | POA: Diagnosis present

## 2013-11-17 MED ORDER — LOPERAMIDE HCL 2 MG PO CAPS
2.0000 mg | ORAL_CAPSULE | Freq: Three times a day (TID) | ORAL | Status: DC | PRN
  Administered 2013-11-18 – 2013-11-20 (×4): 2 mg via ORAL
  Filled 2013-11-17 (×4): qty 1

## 2013-11-17 MED ORDER — SERTRALINE HCL 25 MG PO TABS
25.0000 mg | ORAL_TABLET | Freq: Every day | ORAL | Status: DC
  Administered 2013-11-18 – 2013-11-19 (×2): 25 mg via ORAL
  Filled 2013-11-17 (×4): qty 1

## 2013-11-17 MED ORDER — NAPROXEN 250 MG PO TABS
250.0000 mg | ORAL_TABLET | Freq: Two times a day (BID) | ORAL | Status: DC
  Administered 2013-11-17 – 2013-11-19 (×5): 250 mg via ORAL
  Filled 2013-11-17 (×10): qty 1

## 2013-11-17 MED ORDER — ACETAMINOPHEN 325 MG PO TABS: 650.0000 mg | ORAL_TABLET | Freq: Four times a day (QID) | ORAL | Status: DC | PRN

## 2013-11-17 MED ORDER — DIVALPROEX SODIUM ER 500 MG PO TB24: 500.0000 mg | ORAL_TABLET | Freq: Every day | ORAL | Status: DC

## 2013-11-17 MED ORDER — TRAMADOL HCL 50 MG PO TABS
50.0000 mg | ORAL_TABLET | Freq: Four times a day (QID) | ORAL | Status: DC | PRN
  Administered 2013-11-17 – 2013-11-19 (×6): 50 mg via ORAL
  Filled 2013-11-17 (×6): qty 1

## 2013-11-17 MED ORDER — DIVALPROEX SODIUM ER 500 MG PO TB24
500.0000 mg | ORAL_TABLET | Freq: Every day | ORAL | Status: DC
  Administered 2013-11-17: 500 mg via ORAL
  Filled 2013-11-17 (×2): qty 1

## 2013-11-17 MED ORDER — NAPROXEN 500 MG PO TABS: 500.0000 mg | ORAL_TABLET | Freq: Two times a day (BID) | ORAL | Status: DC

## 2013-11-17 MED ORDER — TRAMADOL HCL 50 MG PO TABS: 100.0000 mg | ORAL_TABLET | Freq: Four times a day (QID) | ORAL | Status: DC | PRN

## 2013-11-17 MED ORDER — LOPERAMIDE HCL 2 MG PO CAPS
4.0000 mg | ORAL_CAPSULE | Freq: Once | ORAL | Status: AC
  Administered 2013-11-17: 4 mg via ORAL
  Filled 2013-11-17 (×2): qty 2

## 2013-11-17 MED ORDER — POTASSIUM CHLORIDE CRYS ER 20 MEQ PO TBCR
20.0000 meq | EXTENDED_RELEASE_TABLET | Freq: Once | ORAL | Status: AC
  Administered 2013-11-17: 20 meq via ORAL
  Filled 2013-11-17: qty 1
  Filled 2013-11-17: qty 2
  Filled 2013-11-17: qty 1
  Filled 2013-11-17: qty 2

## 2013-11-17 MED ORDER — DIVALPROEX SODIUM ER 250 MG PO TB24
250.0000 mg | ORAL_TABLET | Freq: Every day | ORAL | Status: DC
  Administered 2013-11-18: 250 mg via ORAL
  Filled 2013-11-17 (×2): qty 1

## 2013-11-17 MED ORDER — SERTRALINE HCL 50 MG PO TABS: 50.0000 mg | ORAL_TABLET | Freq: Every day | ORAL | Status: DC

## 2013-11-17 MED ORDER — ALUM & MAG HYDROXIDE-SIMETH 200-200-20 MG/5ML PO SUSP: 30.0000 mL | ORAL | Status: DC | PRN

## 2013-11-17 MED ORDER — HYDROXYZINE HCL 25 MG PO TABS
25.0000 mg | ORAL_TABLET | Freq: Every evening | ORAL | Status: DC | PRN
  Administered 2013-11-17: 25 mg via ORAL
  Filled 2013-11-17 (×2): qty 1

## 2013-11-17 MED ORDER — SERTRALINE HCL 50 MG PO TABS
50.0000 mg | ORAL_TABLET | Freq: Every day | ORAL | Status: DC
  Administered 2013-11-17: 50 mg via ORAL
  Filled 2013-11-17: qty 1

## 2013-11-17 MED ORDER — NAPROXEN 375 MG PO TABS
375.0000 mg | ORAL_TABLET | Freq: Two times a day (BID) | ORAL | Status: DC
  Filled 2013-11-17: qty 1

## 2013-11-17 MED ORDER — MAGNESIUM HYDROXIDE 400 MG/5ML PO SUSP: 30.0000 mL | Freq: Every day | ORAL | Status: DC | PRN

## 2013-11-17 NOTE — Consult Note (Signed)
  Patient states that he is depressed related to the pain.  Patient had recent fall and injured his right hip and broke his ribs.  Patient states that he can't take the pain.  Patient lives alone and is unable to contract for safety.  Suicidal ideation with plan to cut himself with knife.  Agree with TTS assessment for inpatient treatment.  Patient has been accepted to [facility] 503/2.  AC Debarah Crape RN) will call with time for transfer.    Shawn B. Rankin FNP-BC

## 2013-11-17 NOTE — Consult Note (Signed)
Face to face evaluation and I agree with this note 

## 2013-11-17 NOTE — Progress Notes (Signed)
[facility]:0200 Referral faxed [place]:0205 Referral Faxed  Mady Gemma, MHT

## 2013-11-17 NOTE — Progress Notes (Signed)
Pt accepted to [facility] 503-2 however bed not available at this time. [place] [facility] to call when bed is available to arrange transfer.   Noreene Larsson [phone]  ED CSW 11/17/2013 1420pm

## 2013-11-17 NOTE — Progress Notes (Addendum)
Patient ID: Shawn [name], male   DOB: 1951/05/31, 63 y.o.   MRN: [id number] Pt is a 63 year voluntary admit to [facility]. He was seen in the Emergency Room twice this week after falling and hitting his head, ribs and right hip. Pt stated he was not drinking at the time but just keeps loosing his balance. Per pt he was started on depokote and neurotin 4 days ago in the ER and his unsteadiness has progressively worsened. Pt c/o rib pain a 10/10 and right hip pain a 10/10. He is allergic to codeine. Pt has a medical hx of: depression. Bipolar, anxiety, arthritis, hernia repair, total knee in 2013. Bowel resection 2013, patella surgery. Pt stated,"I do feel like something cracked in my hip." Pt also has a hx of diverticulitis. Pt admits to drinking three times a week 1/5 of liquor. He has two children . His son lives in [place] and he has a good relationship with him. He is estranged from his daughter. Pt admits that during his twenty years of drinking he has been sober for 5 years in the past.  Pt has a good relationship with his minister and he did list him as a point of contact. Pt was brought on the unit in a wheelchair with his sneakers on. [facility] made aware of pt a moderate fall risk. NP made aware and will consult for physical therapy as well. Pt was given a dinner tray, gatorade and taken into the dayroom. He does contract for safety and denies SI and HI. Pt states he collects disability but would like to know if could also get social security. Instructed pt to speak with his caseworker. He did make staff aware that he has noticed his writing has also become worse and he feels his hands are more shaky than usual. He stated he had a CAT scan of his head in the ER and did not loose conciouness when he fell. He also admits he has been having some blurred vision over the last 4 weeks.

## 2013-11-17 NOTE — ED Provider Notes (Signed)
Medical screening examination/treatment/procedure(s) were conducted as a shared visit with non-physician practitioner(s) and myself.  I personally evaluated the patient during the encounter. 63yo M, c/o etoh abuse, depression, SI without definitive plan. Pt with frequent ED visits for falls due to etoh intoxication. [place] again today, initially endorsed syncopal episode today, but now denies. Pt "doesn't feel safe" going home. Pt lives alone. VSS, A&O, resps easy, RRR, abd soft/NT, neuro non-focal. [facility] Dr. Alcario Drought has evaluated pt; states there is no indication for medical admission at this time and recommends detox program and psych eval/admit. Case Manager has evaluated pt: states pt is eligible for [facility] and needs Home Health services if discharged to home, please re-consult her prn. TTS eval pending.     EKG Interpretation   Date/Time:  Tuesday November 16 2013 18:24:32 EDT Ventricular Rate:  82 PR Interval:  163 QRS Duration: 176 QT Interval:  434 QTC Calculation: 507 R Axis:   95 Text Interpretation:  Sinus rhythm Right bundle branch block Abnormal ekg  When compared with ECG of 09/15/2013 No significant change was found  Confirmed by [facility]  MD, Yue Glasheen ([id number]) on 11/16/2013 11:15:24 PM        Alfonzo Feller, DO 11/17/13 0020

## 2013-11-17 NOTE — Progress Notes (Signed)
11/17/2013 A. Emine Lopata RNCM 1608pm. EDCM spoke to patient at bedside.  Patient to be transferred to [facility].  Patient in need of home health services.  Patient has chosen [facility] for home health services.  EDCM spoke to [place], transition care specilaist for [facility].  As per Erasmo Downer, will follow patient while he is at [facility] and will speak to social worker there to place new home health orders prior to discharge if needed.  EDCM spoke to EDP who placed orders for home health RN, PT, OT, aide and social worker.  EDCM placed order for walker.  EDCM also reviewed with patient private duty nursing list and reminded him to call for services.  Patient's home address confirmed with patient.  However the phone [phone] is no longer a working number for the patient.  Patient reports his pastor Mr. Danne Harbor listed as an emergency contact on his face sheet will no how to contact the patient.  Patient has given permisision to call this number.  EDCM informed patient to let Mr. Danne Harbor know that he may be receiving a phone call from [place].  Mr. Maryann Alar phone number is [phone].  EDCM text Erasmo Downer of [facility] this information. Patient verbalized understanding.  No further EDCm needs at this time.

## 2013-11-17 NOTE — BH Assessment (Signed)
Pt accepted to [facility] per Dr. Donnelly Angelica and Shuvon Rankin. Pt is assigned to bed 503-1 to the care of Dr. Louretta Shorten. All support paperwork is complete.   Shaune Pollack, MS, [place] Assessment Counselor

## 2013-11-18 DIAGNOSIS — F3130 Bipolar disorder, current episode depressed, mild or moderate severity, unspecified: Secondary | ICD-10-CM

## 2013-11-18 DIAGNOSIS — F1010 Alcohol abuse, uncomplicated: Secondary | ICD-10-CM

## 2013-11-18 DIAGNOSIS — R45851 Suicidal ideations: Secondary | ICD-10-CM

## 2013-11-18 MED ORDER — TRAZODONE HCL 50 MG PO TABS
50.0000 mg | ORAL_TABLET | Freq: Every evening | ORAL | Status: DC | PRN
  Administered 2013-11-18 – 2013-11-19 (×2): 50 mg via ORAL
  Filled 2013-11-18 (×2): qty 1

## 2013-11-18 MED ORDER — HYDROXYZINE HCL 25 MG PO TABS
25.0000 mg | ORAL_TABLET | Freq: Four times a day (QID) | ORAL | Status: DC | PRN
  Administered 2013-11-18 – 2013-11-20 (×3): 25 mg via ORAL
  Filled 2013-11-18 (×2): qty 1
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
## 2013-11-18 NOTE — Progress Notes (Signed)
Patient ID: Shawn [name], male   DOB: Sep 22, 1950, 63 y.o.   MRN: [id number] D: Pt. Eyes closed respirations even. A: Writer observed for symptoms of distress. Staff will monitor q43min for safety. R: Pt. Is safe the unit, respirations unlabored.

## 2013-11-18 NOTE — Progress Notes (Signed)
Recreation Therapy Notes  Animal-Assisted Activity/Therapy (AAA/T) Program Checklist/Progress Notes Patient Eligibility Criteria Checklist & Daily Group note for Rec Tx Intervention  Date: 03.19.2015 Time: 2:45pm Location: [address]   AAA/T Program Assumption of Risk Form signed by Patient/ or Parent Legal Guardian yes  Patient is free of allergies or sever asthma yes  Patient reports no fear of animals yes  Patient reports no history of cruelty to animals yes   Patient understands his/her participation is voluntary yes  Patient washes hands before animal contact yes  Patient washes hands after animal contact yes  Behavioral Response: Appropriate   Education: Hand Washing, Appropriate Animal Interaction   Education Outcome: Acknowledges understanding   Clinical Observations/Feedback: Patient pet therapy dog appropriately and interacted with peers appropriately during session.   Laureen Ochs Anquanette Bahner, LRT/CTRS  Lane Hacker 11/18/2013 4:31 PM

## 2013-11-18 NOTE — B[facility] Counselor (Signed)
Adult Psychosocial Assessment Update Interdisciplinary Team  Previous [facility] admissions/discharges:  Admissions Discharges  Date: 04/12/13 Date: 04/15/13  Date: Date:  Date: Date:  Date: Date:  Date: Date:   Changes since the last Psychosocial Assessment (including adherence to outpatient mental health and/or substance abuse treatment, situational issues contributing to decompensation and/or relapse). Pt reports having 2 falls in March and having a lot of pain from the falls, which caused pt to be depressed and suicidal.  Pt states that he divorced his wife 4 months ago.  Pt states that he has to move out of his current home by May 1 which is stressful.  Pt reports his main stressor is his health issues and recent falls.  Pt reports going to [facility] regularly for outpatient medication management and therapy.               Discharge Plan 1. Will you be returning to the same living situation after discharge?   Yes: X Pt can return home in [place].   No:      If no, what is your plan?           2. Would you like a referral for services when you are discharged? Yes:  X   If yes, for what services?  CSW will refer pt back to [facility] for outpatient medication management and therapy.    No:              Summary and Recommendations (to be completed by the evaluator) Patient is a 63 year old Caucasian Male with a diagnosis of Bipolar I disorder, Current or most recent episode depressed, Severe; Alcohol use disorder, Moderate.  Patient lives in [place] alone.  Patient will benefit from crisis stabilization, medication evaluation, group therapy and psycho education in addition to case management for discharge planning.                         Signature:  Ane Payment, 11/18/2013 8:50 AM

## 2013-11-18 NOTE — Progress Notes (Signed)
D   Pt is depressed and sad  He complains of some anxiety   He is pleasant and cooperative and is active in groups   Pt uses a wheelchair for his unsteady gait and history of a fall   He has some mild confusion A   Verbal support given   Medications administered and effectiveness monitored   Q 15 min checks R   Pt safe at present

## 2013-11-18 NOTE — Evaluation (Signed)
Physical Therapy Evaluation Patient Details Name: Shawn [name] MRN: [id number] DOB: 06-May-1951 Today's Date: 11/18/2013 Time: 1610-1700 PT Time Calculation (min): 50 min  PT Assessment / Plan / Recommendation History of Present Illness  Pt reports dizziness, recetn falls (a lot) , L leg weakness due to prior surgeries (years ago), hip pain bilaterally due to the falls.   Clinical Impression  Pt with weakness, decreased ROM, unsteady mobility and decreased ability with mobility. To benefit from PT to increase safety , strength and ability to mobilize with LRAD at Mod I level.     PT Assessment  Patient needs continued PT services    Follow Up Recommendations  SNF    Does the patient have the potential to tolerate intense rehabilitation      Barriers to Discharge        Equipment Recommendations  Rolling walker with 5" wheels    Recommendations for Other Services     Frequency Min 3X/week    Precautions / Restrictions Precautions Precautions: Fall Restrictions Weight Bearing Restrictions: No   Pertinent Vitals/Pain Slight pain in L ribs, and L and rib lateral hip area. However when up and walking pt did not favor this in his gait and stated it felt "ok".       Mobility  Bed Mobility Overal bed mobility: Needs Assistance Bed Mobility: Supine to Sit;Sit to Supine Supine to sit: Min guard Sit to supine: Min guard General bed mobility comments: to steady due to unsteady with dizziness upon movement Transfers Overall transfer level: Needs assistance Equipment used: Rolling walker (2 wheeled);None Transfers: Sit to/from [employer] to Stand: Min assist (to RW and with no AD needed help steadying due to LOB upon rising) Stand pivot transfers: Min guard General transfer comment: worked on this pivot using railing in bathroom 3 times with placing the WC in proper position and safety with swinging away leg rests and brakes. Lots of education and  repeating was perfromed during this session for pt to perform this with supervision.  Ambulation/Gait Ambulation/Gait assistance: Min assist Ambulation Distance (Feet): 50 Feet (then 100 after seated break) Assistive device: Rolling walker (2 wheeled) Gait Pattern/deviations: Step-through pattern Gait velocity: slow guarded General Gait Details: guarded, educated pt to not have fast head movments or eye gazing to minimize dizziness when up on feet.     Exercises Other Exercises Other Exercises: instricted pt with WC safety dn how to safely tranfer in his bathroom here at [facility] with education for brakes and leg rest swingaway. Marked place on floor to prompt him for [facility] parking to prepare for transfer apporpirately. Educated with slow head movement and gaze with steadying himself for any other movement performed. Also really emphasized for him to remain in [facility] unless someone was hwith him for his safety.    PT Diagnosis: Difficulty walking;Generalized weakness  PT Problem List: Decreased strength;Decreased activity tolerance;Decreased balance;Decreased mobility;Decreased knowledge of use of DME;Decreased safety awareness PT Treatment Interventions: DME instruction;Gait training;Functional mobility training;Therapeutic activities;Balance training;Therapeutic exercise;Patient/family education     PT Goals(Current goals can be found in the care plan section) Acute Rehab PT Goals Patient Stated Goal: I want to get better, one step at a time PT Goal Formulation: With patient Time For Goal Achievement: 12/02/13 Potential to Achieve Goals: Good  Visit Information  Last PT Received On: 11/18/13 Assistance Needed: +2 (for safety with dizziness (towards end of eval +1)) History of Present Illness: Pt reports dizziness, recetn falls (a lot) , L leg weakness  due to prior surgeries (years ago), hip pain bilaterally due to the falls.        Prior [place] expects to be  discharged to:: Skilled nursing facility (pt willing to go to skilled facitily for therapy (Rehab) to help get stronger and more steady) Living Arrangements: Alone Additional Comments: pt states he lives alone in small house Prior Function Level of Independence: Independent Comments: however was falling a lot and coulnd't make at home recently. Prior to this dizziness he was doing pretty good he stated using  straight cane . needs some new glasses and wants to order some off [employer] (wear you can get them for cheap), he thinks his lens fell out when he fell.  Communication Communication: No difficulties    Cognition  Cognition Arousal/Alertness: Awake/alert Behavior During Therapy: WFL for tasks assessed/performed Overall Cognitive Status: Within Functional Limits for tasks assessed    Extremity/Trunk Assessment Upper Extremity Assessment Upper Extremity Assessment: Overall WFL for tasks assessed Lower Extremity Assessment Lower Extremity Assessment: Generalized weakness;LLE deficits/detail LLE Deficits / Details: weakness 3+/5 with knee ext, and hip flexion.  Knee flexion grossly 50-60 degrees therefore difficult to use on sit to stand functionaly due to lack of flexion    Balance General Comments General comments (skin integrity, edema, etc.): pt reported dizziness when he rolls and upin standing and evident with staggering with any of these movments. Did perfrom eye movments in all directions and gaze stability with no provoked nystagmus. did also perform the modified halpike dx, in sitting to R and L with no nystagmus, however pt very guarded with his head movment. Then performed rolling R and L with some slight nystagmus and dizziness reported with rolled to L sidelying greater than to the right.   End of Session PT - End of Session Equipment Utilized During Treatment: Gait belt Activity Tolerance: Patient tolerated treatment well Patient left: in chair (in his WC with nurse in  hallway) Nurse Communication: Mobility status (need to have someon with him if stand up out of the WC . )  GP Functional Assessment Tool Used: clinical judgement Functional Limitation: Mobility: Walking and moving around Mobility: Walking and Moving Around Current Status ([id number]): At least 20 percent but less than 40 percent impaired, limited or restricted Mobility: Walking and Moving Around Goal Status [phone]): At least 1 percent but less than 20 percent impaired, limited or restricted   Clide Dales 11/18/2013, 5:33 PM Clide Dales, PT Pager: [phone] 11/18/2013

## 2013-11-18 NOTE — [facility] Suicide Risk Assessment (Signed)
[facility] Adult Inpatient Family/Significant Other Suicide Prevention Education  Suicide Prevention Education:   Patient Refusal for Family/Significant Other Suicide Prevention Education: The patient has refused to provide written consent for family/significant other to be provided Family/Significant Other Suicide Prevention Education during admission and/or prior to discharge.  Physician notified.  CSW provided suicide prevention information with patient.    The suicide prevention education provided includes the following:  Suicide risk factors  Suicide prevention and interventions  National Suicide Hotline telephone number  [facility] assessment telephone number  [facility] Emergency Assistance [place] and/or Residential Mobile Crisis Unit telephone number   Regan Lemming, [place] 11/18/2013 10:27 AM

## 2013-11-18 NOTE — H&P (Signed)
Psychiatric Admission Assessment Adult  Patient Identification:  Shawn [name] Date of Evaluation:  11/18/2013 Chief Complaint:  BIPOLAR I History of Present Illness::  Shawn [name] is a 63 y.o. male who presents voluntarily to [facility] due to complications from a fall that happened today. Pt sustained visible bruises and cuts to bilateral arms from a previous fall and today, he is also c/o hip and rib pain. Per notes from examining PA, states that pt is a poor historian, however he is able to answer most questions posed by this writer w/o difficulty but he is confused on some dates and times. Pt confirms that he is SI w/no current plan or intent to harm self--"I don't have a plan yet." When asked fif he feels safe returning home, he says--"I may eventually hurt myself." Pt lives alone and tells this write that he has no support from family, though he has a sister who lives in town. Pt states he's been having SI thoughts for approx 2-3 mos due to the changes in his health--he has knee problems, falls, negative changes due to falls. He has no past hx of SI attempts but  Pt endorses depressive sxs: insomnia(2-3 hrs sleep, daily); not eating(25 pf wt loss); isolation and anhedonia. Pt is grooming less due to because of hand tremors that he says started 5 days ago--"I was going to shave today, but I was afraid." Pt reports hearing voices for several years with command to harm self--"I heard voices this morning.". Pt admits he has a problems with alcohol, consuming 1 pint of alcohol, 2-3x's a week, states last time he drank was 2 days ago. He denies he had any alcohol today, but BAL was 45. Pt is visibly anxious and restless during the interview with this Probation officer. He is ambulatory but has an unsteady gait, he has hold to railings to steady himself. Pt is pleasant, cooperative and soft spoken. Pt has past inpt admission in [place], [place], approx 20 yrs ago after his separation from his wife--"I  just lost it." Pt hx shows mutliple admits with other facilities. He is currently seeking psychiatric services with Dr. Lenore Cordia.   During admission assessment, pt rates anxiety at 9/10 and depression at 10/10. Pt denies HI, and contracts for safety. Pt reports VH seeing the walls move and AH hearing voices saying "go take a leak" so he will get up and use the restroom. Pt reports SI but without plan. Pt states that his depression is secondary to his falls and declining health conditions, stating that he is getting worse and worse with falls and injuries. Pt also cites his divorce 42mo ago is a major stressor, stating that his wife mailed him divorce papers and it was a surprise.   Elements:  Location:  generalized, inpatient. Quality:  Improving. Severity:  Severe. Timing:  Constant. Duration:  Chronic x 5 months. Context:  Pt is more upset after declining health conditions along with his wife surprising him with a divorce.. Associated Signs/Synptoms: Depression Symptoms:  depressed mood, anhedonia, insomnia, feelings of worthlessness/guilt, difficulty concentrating, hopelessness, impaired memory, recurrent thoughts of death, anxiety, loss of energy/fatigue, decreased labido, (Hypo) Manic Symptoms:  Denies Anxiety Symptoms:  Excessive Worry, Psychotic Symptoms:  Hallucinations: Auditory Visual PTSD Symptoms: Denies Total Time spent with patient: Greater than 30 minutes  Psychiatric Specialty Exam: Physical Exam  Review of Systems  Constitutional: Negative.   HENT: Negative.   Eyes: Negative.   Respiratory: Negative.   Cardiovascular: Negative.  Gastrointestinal: Negative.   Genitourinary: Negative.   Musculoskeletal: Positive for back pain, falls, joint pain and myalgias.  Skin: Negative.   Neurological: Negative.   Endo/Heme/Allergies: Negative.   Psychiatric/Behavioral: Positive for depression. The patient is nervous/anxious and has insomnia.     Blood pressure  111/77, pulse 81, temperature 97.5 F (36.4 C), temperature source Oral, resp. rate 20, height $RemoveBe'6\' 1"'pYOWZljtO$  (1.854 m), weight 72.576 kg (160 lb), SpO2 98.00%.Body mass index is 21.11 kg/(m^2).  General Appearance: Casual  Eye Contact::  Good  Speech:  Clear and Coherent  Volume:  Normal  Mood:  Depressed  Affect:  Depressed  Thought Process:  Coherent  Orientation:  Full (Time, Place, and Person)  Thought Content:  WDL  Suicidal Thoughts:  Yes.  without intent/plan  Homicidal Thoughts:  No  Memory:  Immediate;   Fair Recent;   Fair Remote;   Fair  Judgement:  Fair  Insight:  Fair  Psychomotor Activity:  Normal  Concentration:  Good  Recall:  Good  Fund of Knowledge:Good  Language: Good  Akathisia:  NA  Handed:    AIMS (if indicated):     Assets:  Desire for Improvement Resilience  Sleep:  Number of Hours: 1.75    Musculoskeletal: Strength & Muscle Tone: decreased Gait & Station: unsteady Patient leans: N/A  Past Psychiatric History: Diagnosis: MDD with Psychotic features, alcohol dependence  Hospitalizations: Denies  Outpatient Care: Denies  Substance Abuse Care:Denies  Self-Mutilation:Denies  Suicidal Attempts:Denies  Violent Behaviors:Denies   Past Medical History:   Past Medical History  Diagnosis Date  . Osteoarthritis X years    left knee.  Distant hx (age 39) "dislocated" knee and required surgery, then crush injury to patella required knee cap removal.  . Diverticular disease     "itis" x 2 episodes (last was about 2007)  . Tobacco dependence   . Multiple rib fractures 10/09/10    s/p fall down a ravine--9th and 10th on right, contusion of left 7th and 8th ribs  . Fracture of metatarsal bone(s), closed     3rd, 4th, 5th mid shaft on left foot  . History of substance abuse     cocaine, marijuana, acid, alcohol (in remission since 2010)  . Solar dermatitis     face and ears  . Bipolar disorder     Has had multiple psych hospitalization in the past, most  recently at [place] [facility] 01/31/11-02/04/11.  Says meds made him emotionally blunted. (lamictal and wellbutrin).  . Alcoholism     "Dry" since 2002; relapse 2014  . Depression   . Anxiety    None. Allergies:   Allergies  Allergen Reactions  . Codeine Nausea Only   PTA Medications: Prescriptions prior to admission  Medication Sig Dispense Refill  . divalproex (DEPAKOTE ER) 500 MG 24 hr tablet Take 1 tablet (500 mg total) by mouth daily. For stabilization  30 tablet  0  . naproxen (NAPROSYN) 375 MG tablet Take 1 tablet (375 mg total) by mouth 2 (two) times daily.  20 tablet  0  . traMADol (ULTRAM) 50 MG tablet Take 2 tablets (100 mg total) by mouth every 6 (six) hours as needed.  16 tablet  0    Previous Psychotropic Medications:  Medication/Dose  SEE MAR               Substance Abuse History in the last 12 months:  yes  Consequences of Substance Abuse: Medical Consequences:  Falls, hospitalization  Social History:  reports  that he has been smoking Cigarettes.  He has a 30 pack-year smoking history. He has never used smokeless tobacco. He reports that he drinks alcohol. He reports that he does not use illicit drugs. Additional Social History:                      Current Place of Residence:  [place] of Birth:  [place] Family Members: ex wife, 2 kids Marital Status:  Divorced Children:  Sons: 1  Daughters: 1  Relationships: Single Education:  [employer] Educational Problems/Performance:Denies Religious Beliefs/Practices:  History of Abuse (Emotional/Phsycial/Sexual) Denies Pensions consultant; Nature conservation officer, Press photographer, Mudlogger.  Military History:  Estate agent History: Denies Hobbies/Interests: Fishing, outdoors, woodwork  Family History:   Family History  Problem Relation Age of Onset  . Arthritis Mother   . Arthritis Father   . Cancer Brother     lung cancer.  Died in early 7s.    Results for orders placed during the hospital encounter of 11/16/13  (from the past 72 hour(s))  CBC WITH DIFFERENTIAL     Status: Abnormal   Collection Time    11/16/13  6:10 PM      Result Value Ref Range   WBC 4.4  4.0 - 10.5 K/uL   RBC 5.11  4.22 - 5.81 MIL/uL   Hemoglobin 17.3 (*) 13.0 - 17.0 g/dL   HCT 48.7  39.0 - 52.0 %   MCV 95.3  78.0 - 100.0 fL   MCH 33.9  26.0 - 34.0 pg   MCHC 35.5  30.0 - 36.0 g/dL   RDW 13.6  11.5 - 15.5 %   Platelets 196  150 - 400 K/uL   Neutrophils Relative % 76  43 - 77 %   Neutro Abs 3.3  1.7 - 7.7 K/uL   Lymphocytes Relative 14  12 - 46 %   Lymphs Abs 0.6 (*) 0.7 - 4.0 K/uL   Monocytes Relative 8  3 - 12 %   Monocytes Absolute 0.4  0.1 - 1.0 K/uL   Eosinophils Relative 1  0 - 5 %   Eosinophils Absolute 0.1  0.0 - 0.7 K/uL   Basophils Relative 1  0 - 1 %   Basophils Absolute 0.0  0.0 - 0.1 K/uL  COMPREHENSIVE METABOLIC PANEL     Status: Abnormal   Collection Time    11/16/13  6:10 PM      Result Value Ref Range   Sodium 144  137 - 147 mEq/L   Potassium 3.3 (*) 3.7 - 5.3 mEq/L   Chloride 100  96 - 112 mEq/L   CO2 27  19 - 32 mEq/L   Glucose, Bld 93  70 - 99 mg/dL   BUN 9  6 - 23 mg/dL   Creatinine, Ser 0.76  0.50 - 1.35 mg/dL   Calcium 9.5  8.4 - 10.5 mg/dL   Total Protein 7.5  6.0 - 8.3 g/dL   Albumin 4.2  3.5 - 5.2 g/dL   AST 55 (*) 0 - 37 U/L   ALT 23  0 - 53 U/L   Alkaline Phosphatase 85  39 - 117 U/L   Total Bilirubin 0.9  0.3 - 1.2 mg/dL   GFR calc non Af Amer >90  >90 mL/min   GFR calc Af Amer >90  >90 mL/min   Comment: (NOTE)     The eGFR has been calculated using the CKD EPI equation.     This calculation has not been validated in  all clinical situations.     eGFR's persistently <90 mL/min signify possible Chronic Kidney     Disease.  ETHANOL     Status: Abnormal   Collection Time    11/16/13  6:10 PM      Result Value Ref Range   Alcohol, Ethyl (B) 45 (*) 0 - 11 mg/dL   Comment:            LOWEST DETECTABLE LIMIT FOR     SERUM ALCOHOL IS 11 mg/dL     FOR MEDICAL PURPOSES ONLY   ACETAMINOPHEN LEVEL     Status: None   Collection Time    11/16/13  6:10 PM      Result Value Ref Range   Acetaminophen (Tylenol), Serum <15.0  10 - 30 ug/mL   Comment:            THERAPEUTIC CONCENTRATIONS VARY     SIGNIFICANTLY. A RANGE OF 10-30     ug/mL MAY BE AN EFFECTIVE     CONCENTRATION FOR MANY PATIENTS.     HOWEVER, SOME ARE BEST TREATED     AT CONCENTRATIONS OUTSIDE THIS     RANGE.     ACETAMINOPHEN CONCENTRATIONS     >150 ug/mL AT 4 HOURS AFTER     INGESTION AND >50 ug/mL AT 12     HOURS AFTER INGESTION ARE     OFTEN ASSOCIATED WITH TOXIC     REACTIONS.  SALICYLATE LEVEL     Status: Abnormal   Collection Time    11/16/13  6:10 PM      Result Value Ref Range   Salicylate Lvl <9.3 (*) 2.8 - 20.0 mg/dL  VALPROIC ACID LEVEL     Status: Abnormal   Collection Time    11/16/13  6:10 PM      Result Value Ref Range   Valproic Acid Lvl <10.0 (*) 50.0 - 100.0 ug/mL   Comment: Performed at [place]     Status: None   Collection Time    11/16/13  6:22 PM      Result Value Ref Range   Opiates NONE DETECTED  NONE DETECTED   Cocaine NONE DETECTED  NONE DETECTED   Benzodiazepines NONE DETECTED  NONE DETECTED   Amphetamines NONE DETECTED  NONE DETECTED   Tetrahydrocannabinol NONE DETECTED  NONE DETECTED   Barbiturates NONE DETECTED  NONE DETECTED   Comment:            DRUG SCREEN FOR MEDICAL PURPOSES     ONLY.  IF CONFIRMATION IS NEEDED     FOR ANY PURPOSE, NOTIFY LAB     WITHIN 5 DAYS.                LOWEST DETECTABLE LIMITS     FOR URINE DRUG SCREEN     Drug Class       Cutoff (ng/mL)     Amphetamine      1000     Barbiturate      200     Benzodiazepine   734     Tricyclics       287     Opiates          300     Cocaine          300     THC              50  URINALYSIS, ROUTINE W REFLEX MICROSCOPIC  Status: Abnormal   Collection Time    11/16/13  6:22 PM      Result Value Ref Range   Color, Urine AMBER  (*) YELLOW   Comment: BIOCHEMICALS MAY BE AFFECTED BY COLOR   APPearance CLEAR  CLEAR   Specific Gravity, Urine 1.020  1.005 - 1.030   pH 6.0  5.0 - 8.0   Glucose, UA NEGATIVE  NEGATIVE mg/dL   Hgb urine dipstick NEGATIVE  NEGATIVE   Bilirubin Urine SMALL (*) NEGATIVE   Ketones, ur NEGATIVE  NEGATIVE mg/dL   Protein, ur NEGATIVE  NEGATIVE mg/dL   Urobilinogen, UA 1.0  0.0 - 1.0 mg/dL   Nitrite NEGATIVE  NEGATIVE   Leukocytes, UA NEGATIVE  NEGATIVE   Comment: MICROSCOPIC NOT DONE ON URINES WITH NEGATIVE PROTEIN, BLOOD, LEUKOCYTES, NITRITE, OR GLUCOSE <1000 mg/dL.   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 Osteoarthritis X years    left knee.  Distant hx (age 29) "dislocated" knee and required surgery, then crush injury to patella required knee cap removal.  . Diverticular disease     "itis" x 2 episodes (last was about 2007)  . Tobacco dependence   . Multiple rib fractures 10/09/10    s/p fall down a ravine--9th and 10th on right, contusion of left 7th and 8th ribs  . Fracture of metatarsal bone(s), closed     3rd, 4th, 5th mid shaft on left foot  . History of substance abuse     cocaine, marijuana, acid, alcohol (in remission since 2010)  . Solar dermatitis     face and ears  . Bipolar disorder     Has had multiple psych hospitalization in the past, most recently at [facility] [facility] 01/31/11-02/04/11.  Says meds made him emotionally blunted. (lamictal and wellbutrin).  . Alcoholism     "Dry" since 2002; relapse 2014  . Depression   . Anxiety    AXIS IV:  other psychosocial or environmental problems and problems related to  social environment AXIS V:  41-50 serious symptoms  Treatment Plan/Recommendations:   Review of chart, vital signs, medications, and notes.  1-Individual and group therapy  2-Medication management for depression and anxiety: Medications reviewed with the patient and she stated no untoward effects, unchanged. 3-Coping skills for depression, anxiety  4-Continue crisis stabilization and management  5-Address health issues--monitoring vital signs, stable  6-Treatment plan in progress to prevent relapse of depression and anxiety  Treatment Plan Summary: Daily contact with patient to assess and evaluate symptoms and progress in treatment Medication management Current Medications:  Current Facility-Administered Medications  Medication Dose Route Frequency Provider Last Rate Last Dose  . acetaminophen (TYLENOL) tablet 650 mg  650 mg Oral Q6H PRN Benjamine Mola, FNP      . alum & mag hydroxide-simeth (MAALOX/MYLANTA) 200-200-20 MG/5ML suspension 30 mL  30 mL Oral Q4H PRN Benjamine Mola, FNP      . divalproex (DEPAKOTE ER) 24 hr tablet 250 mg  250 mg Oral QHS Benjamine Mola, FNP   250 mg at 11/18/13 2145  . hydrOXYzine (ATARAX/VISTARIL) tablet 25 mg  25 mg Oral Q6H PRN Encarnacion Slates, NP   25 mg at  11/18/13 2144  . loperamide (IMODIUM) capsule 2 mg  2 mg Oral TID PRN Benjamine Mola, FNP   2 mg at 11/18/13 0815  . magnesium hydroxide (MILK OF MAGNESIA) suspension 30 mL  30 mL Oral Daily PRN Benjamine Mola, FNP      . naproxen (NAPROSYN) tablet 250 mg  250 mg Oral BID WC Benjamine Mola, FNP   250 mg at 11/18/13 1704  . sertraline (ZOLOFT) tablet 25 mg  25 mg Oral Daily Benjamine Mola, FNP   25 mg at 11/18/13 5956  . traMADol (ULTRAM) tablet 50 mg  50 mg Oral Q6H PRN Benjamine Mola, FNP   50 mg at 11/18/13 2145    Observation Level/Precautions:  15 minute checks  Laboratory:  Labs resulted, reviewed, and stable at this time.   Psychotherapy:  Group therapy, individual therapy, psychoeducation   Medications:  See MAR above  Consultations: None    Discharge Concerns: None    Estimated LOS: 5-7 days  Other:  N/A    I certify that inpatient services furnished can reasonably be expected to improve the patient's condition.   Benjamine Mola, FNP-BC 11/18/2013 5:56 PM  Patient was seen face to face for psychiatric evaluation, suicide risk assessment and case discussed with physician extender and formulated treatment plan. Reviewed the information documented and agree with the treatment plan.  Luccia Reinheimer,JANARDHAHA R. 11/21/2013 4:38 PM

## 2013-11-18 NOTE — BHH Group Notes (Signed)
[place] LCSW Group Therapy  11/18/2013  1:15 PM   Type of Therapy:  Group Therapy  Participation Level:  Active  Participation Quality:  Attentive, Sharing and Supportive  Affect:  Depressed and Flat  Cognitive:  Alert and Oriented  Insight:  Developing/Improving and Engaged  Engagement in Therapy:  Developing/Improving and Engaged  Modes of Intervention:  Clarification, Confrontation, Discussion, Education, Exploration, Limit-setting, Orientation, Problem-solving, Rapport Building, Art therapist, Socialization and Support  Summary of Progress/Problems: The topic for group was balance in life.  Today's group focused on defining balance in one's own words, identifying things that can knock one off balance, and exploring healthy ways to maintain balance in life. Group members were asked to provide an example of a time when they felt off balance, describe how they handled that situation,and process healthier ways to regain balance in the future. Group members were asked to share the most important tool for maintaining balance that they learned while at [facility] and how they plan to apply this method after discharge.  Pt discussed his life being off balance after the medical issues from falling and going through a divorce.  Pt related most of his recovery to AA, as he states that this has helped him in the past.  Pt states that he has to learn to accept the divorce and medical issues in order to move on and regain a balanced life.  Pt actively participated and was engaged in group discussion.    Regan Lemming, LCSW 11/18/2013  2:26 PM

## 2013-11-18 NOTE — Progress Notes (Signed)
Patient ID: Shawn [name], male   DOB: 1951-06-25, 63 y.o.   MRN: [id number]  [facility] 9:30 AM  The focus of this group is to educate the patient on the purpose and policies of crisis stabilization and provide a format to answer questions about their admission.  The group details unit policies and expectations of patients while admitted.  Patient attended group but was not able to participate in exercises. Patient reports that his goal is to speak with the social worker today about "personal things." Patient was attentive during group.

## 2013-11-18 NOTE — Progress Notes (Signed)
Patient ID: Shawn [name], male   DOB: 1950/10/10, 63 y.o.   MRN: [id number]  D: Pt. Denies SI/HI and A/V Hallucinations. Patient rates his depression 0/10 and his hopelessness 3/10 for the day. Patient does report pain in his right hip and left rib area due to prior falls. Patient received PRN medication for his pain and reported some relief. Patient is currently in wheelchair due to unsteady gait and dizziness. Writer spoke with PT today and they are to see patient at some point during this day. Patient was informed. Patient is comfortable in his wheelchair and can use his arms to move wheelchair.  A: Support and encouragement provided to the patient to come to writer with any questions or concerns. Patient reports that he slept well last night for the first time in a few days.  R: Patient is receptive and cooperative but minimal. Patient is seen in the milieu and is going to groups. Q15 minute checks are maintained for safety.

## 2013-11-19 MED ORDER — SERTRALINE HCL 50 MG PO TABS
50.0000 mg | ORAL_TABLET | Freq: Every day | ORAL | Status: DC
  Administered 2013-11-20: 50 mg via ORAL
  Filled 2013-11-19 (×2): qty 1

## 2013-11-19 MED ORDER — SERTRALINE HCL 25 MG PO TABS
25.0000 mg | ORAL_TABLET | Freq: Once | ORAL | Status: AC
  Administered 2013-11-19: 25 mg via ORAL
  Filled 2013-11-19 (×2): qty 1

## 2013-11-19 MED ORDER — DIVALPROEX SODIUM ER 250 MG PO TB24
250.0000 mg | ORAL_TABLET | Freq: Every day | ORAL | Status: DC
  Administered 2013-11-20: 250 mg via ORAL
  Filled 2013-11-19 (×2): qty 1

## 2013-11-19 NOTE — Progress Notes (Signed)
D: Patient denies SI/HI and A/V hallucinations; patient reports sleep is poor; reports appetite is improving ; reports energy level is low; reports ability to pay attention is improving; rates depression as 5/10; rates hopelessness 5/10;   A: Monitored q 15 minutes; patient encouraged to attend groups; patient educated about medications; patient given medications per physician orders; patient encouraged to express feelings and/or concerns  R: Patient is cooperative and pleasant; patient was tired this morning because he reports that he did not sleep well because he received depakote at night; patient's interaction with staff and peers is appropriate; patient reports that he has some relief from the pain medication; patient was able to set goal to talk with staff 1:1 when having feelings of SI; patient is taking medications as prescribed and tolerating medications; patient is attending most groups and engaging

## 2013-11-19 NOTE — BHH Group Notes (Signed)
[place] LCSW Group Therapy  Feelings Around Relapse 1:15 -2:30        11/19/2013  2:44 PM   Type of Therapy:  Group Therapy  Participation Level:  Appropriate  Participation Quality:  Appropriate  Affect:  Appropriate  Cognitive:  Attentive Appropriate  Insight:  Developing/Improving  Engagement in Therapy: Developing/Improving  Modes of Intervention:  Discussion Exploration Problem-Solving Supportive  Summary of Progress/Problems:  The topic for today was feelings around relapse.    Patient processed feelings toward relapse and was able to relate to peers.  He advised he would bve drinking if he relapsed.  Patient shared he has a goal to be sober and shared he had two years of sobriety many years ago.  Patient identified coping skills that can be used to prevent a relapse.   Concha Pyo 11/19/2013 2:44 PM

## 2013-11-19 NOTE — Tx Team (Signed)
Interdisciplinary Treatment Plan Update   Date Reviewed:  11/19/2013  Time Reviewed:  8:37 AM  Progress in Treatment:   Attending groups: Yes Participating in groups: Yes Taking medication as prescribed: Yes  Tolerating medication: Yes Family/Significant other contact made:  No, but will ask patient for consent for collateral contact Patient understands diagnosis: Yes  Discussing patient identified problems/goals with staff: Yes Medical problems stabilized or resolved: Yes Denies suicidal/homicidal ideation: Yes Patient has not harmed self or others: Yes  For review of initial/current patient goals, please see plan of care.  Estimated Length of Stay:  3-5 days  Reasons for Continued Hospitalization:  Anxiety Depression Medication stabilization  New Problems/Goals identified:    Discharge Plan or Barriers:   Home with outpatient follow up to be determined  Additional Comments:  Shawn [name] is a 63 y.o. male who presents voluntarily to [facility] due to complications from a fall that happened today. Pt sustained visible bruises and cuts to bilateral arms from a previous fall and today, he is also c/o hip and rib pain. Per notes from examining PA, states that pt is a poor historian, however he is able to answer most questions posed by this writer w/o difficulty but he is confused on some dates and times. Pt confirms that he is SI w/no current plan or intent to harm self--"I don't have a plan yet." When asked fif he feels safe returning home, he says--"I may eventually hurt myself." Pt lives alone and tells this write that he has no support from family, though he has a sister who lives in town. Pt states he's been having SI thoughts for approx 2-3 mos due to the changes in his health--he has knee problems, falls, negative changes due to falls. He has no past hx of SI attempts but  Pt endorses depressive sxs: insomnia(2-3 hrs sleep, daily); not eating(25 pf wt loss);  isolation and anhedonia. Pt is grooming less due to because of hand tremors that he says started 5 days ago--"I was going to shave today, but I was afraid." Pt reports hearing voices for several years with command to harm self--"I heard voices this morning.". Pt admits he has a problems with alcohol, consuming 1 pint of alcohol, 2-3x's a week, states last time he drank was 2 days ago. He denies he had any alcohol today, but BAL was 45. Pt is visibly anxious and restless during the interview with this Probation officer. He is ambulatory but has an unsteady gait, he has hold to railings to steady himself   Attendees:  Patient:  11/19/2013 8:37 AM   Signature: Mylinda Latina, MD 11/19/2013 8:37 AM  Signature:   11/19/2013 8:37 AM  Signature:  11/19/2013 8:37 AM  Signature:Beverly Danelle Earthly, RN 11/19/2013 8:37 AM  Signature:  11/19/2013 8:37 AM  Signature:  Joette Catching, LCSW 11/19/2013 8:37 AM  Signature:  Vidal Schwalbe, LCSW - Lead SW 11/19/2013 8:37 AM  Signature:  Lucinda Dell, Care Coordinator [facility] 11/19/2013 8:37 AM  Signature:   11/19/2013 8:37 AM  Signature: Marilynne Halsted, RN 11/19/2013  8:37 AM  Signature:   Lars Pinks, RN [name] 11/19/2013  8:37 AM  Signature:  Eduard Roux, RN 11/19/2013  8:37 AM    Scribe for Treatment Team:   Joette Catching,  11/19/2013 8:37 AM

## 2013-11-19 NOTE — ED Provider Notes (Signed)
Medical screening examination/treatment/procedure(s) were conducted as a shared visit with non-physician practitioner(s) and myself.  I personally evaluated the patient during the encounter. Please see my previous note.     Alfonzo Feller, DO 11/19/13 1236

## 2013-11-19 NOTE — Progress Notes (Signed)
[facility] Group Notes:  (Nursing/MHT/Case Management/Adjunct)  Date:  11/19/2013  Time:  6:52 PM  Type of Therapy:  Therapeutic Activity  Participation Level:  Active  Participation Quality:  Appropriate, Attentive and Supportive  Affect:  Appropriate  Cognitive:  Appropriate  Insight:  Appropriate  Engagement in Group:  Engaged and Supportive  Modes of Intervention:  Activity  Summary of Progress/Problems: Pts played a game of "Human Bingo"  [name] 11/19/2013, 6:52 PM

## 2013-11-19 NOTE — Progress Notes (Signed)
Adult Psychoeducational Group Note  Date:  11/19/2013 Time:  10:00AM  Group Topic/Focus:  Early Warning Signs:   The focus of this group is to help patients identify signs or symptoms they exhibit before slipping into an unhealthy state or crisis.  Participation Level:  Active  Participation Quality:  Appropriate and Attentive  Affect:  Appropriate  Cognitive:  Appropriate  Insight: Appropriate  Engagement in Group:  Engaged and Supportive  Modes of Intervention:  Discussion, Socialization and Support  Additional Comments:  Pts identified early warning signs that they notice about themselves when they begin to start spiraling down towards relapse. Pts identified which warning sign is most relevant to them and how they can cope or find other ways to deal with stress and triggers before they relapse. Pt identified drinking as his warning sign. Pt stated he has a Theme park manager at church that really helps him and he needs to get back into church.  [name] 11/19/2013, 2:14 PM

## 2013-11-19 NOTE — Progress Notes (Signed)
Patient ID: [name], male   DOB: 1951-01-31, 63 y.o.   MRN: [id number] D: pt reporting up reporting to staff that he is having problems sleeping. A: Writer called NP and client was given order for Trazodone 50mg  for sleep. Writer administered accordingly. Staff will monitor q11min for safety. R: pt. Is safe on the unit.

## 2013-11-19 NOTE — Progress Notes (Signed)
[facility] MD Progress Note  11/19/2013 12:41 PM Shawn [name]  MRN:  [id number] Subjective:  Shawn [name] is a 63 y.o. male who presents voluntarily to [facility] due to complications from a fall that happened today. Pt sustained visible bruises and cuts to bilateral arms from a previous fall and today, he is also c/o hip and rib pain. Per notes from examining PA, states that pt is a poor historian, however he is able to answer most questions posed by this writer w/o difficulty but he is confused on some dates and times. Pt confirms that he is SI w/no current plan or intent to harm self--"I don't have a plan yet." When asked fif he feels safe returning home, he says--"I may eventually hurt myself." Pt lives alone and tells this write that he has no support from family, though he has a sister who lives in town. Pt states he's been having SI thoughts for approx 2-3 mos due to the changes in his health--he has knee problems, falls, negative changes due to falls. He has no past hx of SI attempts but Pt endorses depressive sxs: insomnia(2-3 hrs sleep, daily); not eating(25 pf wt loss); isolation and anhedonia. Pt is grooming less due to because of hand tremors that he says started 5 days ago--"I was going to shave today, but I was afraid." Pt reports hearing voices for several years with command to harm self--"I heard voices this morning.". Pt admits he has a problems with alcohol, consuming 1 pint of alcohol, 2-3x's a week, states last time he drank was 2 days ago. He denies he had any alcohol today, but BAL was 45. Pt is visibly anxious and restless during the interview with this Probation officer. He is ambulatory but has an unsteady gait, he has hold to railings to steady himself. Pt is pleasant, cooperative and soft spoken. Pt has past inpt admission in [place], [place], approx 20 yrs ago after his separation from his wife--"I just lost it." Pt hx shows mutliple admits with other facilities. He is currently seeking  psychiatric services with Dr. Lenore Cordia.   During admission assessment, patient was found in his bed room and lying with calm and quiet. Patient stated that staff member told him to stay in his bed due to frequent falls. He continue to report increased symptoms of anxiety and depression. He rates anxiety at 10/10 and depression at 10/10. Patient has suicidal ideation but contract for safety while in hospital. He reports VH seeing the walls move and AH hearing voices saying "go take a leak" so he will get up and use the restroom. He states that his depression is secondary to his falls and declining health conditions, stating that he is getting worse and worse with falls and injuries. Pt also cites his divorce 47mo ago is a major stressor, stating that his wife mailed him divorce papers and it was a surprise.   Diagnosis:   DSM5: Schizophrenia Disorders:   Obsessive-Compulsive Disorders:   Trauma-Stressor Disorders:   Substance/Addictive Disorders:   Depressive Disorders:   Total Time spent with patient: 30 minutes  Axis I: Major Depression, Recurrent severe, Substance Induced Mood Disorder and Alcohol dependence  ADL's:  Impaired  Sleep: Poor  Appetite:  Fair  Suicidal Ideation:  Patient is contracting for safety while in hospital Homicidal Ideation:  denied AEB (as evidenced by):  Psychiatric Specialty Exam: Physical Exam  ROS  Blood pressure 115/74, pulse 53, temperature 97.7 F (36.5 C), temperature source Oral,  resp. rate 18, height 6\' 1"  (1.854 m), weight 72.576 kg (160 lb), SpO2 98.00%.Body mass index is 21.11 kg/(m^2).  General Appearance: Disheveled and Guarded  Eye Sport and exercise psychologist::  Fair  Speech:  Clear and Coherent and Slow  Volume:  Decreased  Mood:  Depressed, Dysphoric, Hopeless and Worthless  Affect:  Depressed and Flat  Thought Process:  Goal Directed and Intact  Orientation:  Full (Time, Place, and Person)  Thought Content:  Rumination  Suicidal Thoughts:  Yes.   without intent/plan  Homicidal Thoughts:  No  Memory:  Immediate;   Fair  Judgement:  Impaired  Insight:  Lacking  Psychomotor Activity:  Psychomotor Retardation  Concentration:  Fair  Recall:  [place]: Fair  Akathisia:  NA  Handed:  Right  AIMS (if indicated):     Assets:  Communication Skills Desire for Improvement Housing Leisure Time Physical Health Resilience Social Support  Sleep:  Number of Hours: 5   Musculoskeletal: Strength & Muscle Tone: within normal limits Gait & Station: normal Patient leans: N/A  Current Medications: Current Facility-Administered Medications  Medication Dose Route Frequency Provider Last Rate Last Dose  . acetaminophen (TYLENOL) tablet 650 mg  650 mg Oral Q6H PRN Benjamine Mola, FNP      . alum & mag hydroxide-simeth (MAALOX/MYLANTA) 200-200-20 MG/5ML suspension 30 mL  30 mL Oral Q4H PRN Benjamine Mola, FNP      . [START ON 11/20/2013] divalproex (DEPAKOTE ER) 24 hr tablet 250 mg  250 mg Oral Q breakfast Durward Parcel, MD      . hydrOXYzine (ATARAX/VISTARIL) tablet 25 mg  25 mg Oral Q6H PRN Encarnacion Slates, NP   25 mg at 11/18/13 2144  . loperamide (IMODIUM) capsule 2 mg  2 mg Oral TID PRN Benjamine Mola, FNP   2 mg at 11/18/13 0815  . magnesium hydroxide (MILK OF MAGNESIA) suspension 30 mL  30 mL Oral Daily PRN Benjamine Mola, FNP      . naproxen (NAPROSYN) tablet 250 mg  250 mg Oral BID WC Benjamine Mola, FNP   250 mg at 11/19/13 0800  . sertraline (ZOLOFT) tablet 25 mg  25 mg Oral Once Durward Parcel, MD      . Derrill Memo ON 11/20/2013] sertraline (ZOLOFT) tablet 50 mg  50 mg Oral Daily Durward Parcel, MD      . traMADol Veatrice Bourbon) tablet 50 mg  50 mg Oral Q6H PRN Benjamine Mola, FNP   50 mg at 11/18/13 2145  . traZODone (DESYREL) tablet 50 mg  50 mg Oral QHS PRN Lurena Nida, NP   50 mg at 11/18/13 2347    Lab Results: No results found for this or any previous visit (from the past 48  hour(s)).  Physical Findings: AIMS:  , ,  ,  ,    CIWA:    COWS:     Treatment Plan Summary: Daily contact with patient to assess and evaluate symptoms and progress in treatment Medication management  Plan: Increase Zoloft 50 mg PO QAm and add zoloft 25 mg once today Change depakote Qam due to increased disturbance of sleep last night Treatment Plan/Recommendations:   1. Admit for crisis management and stabilization. 2. Medication management to reduce current symptoms to base line and improve the patient's overall level of functioning. 3. Treat health problems as indicated. 4. Develop treatment plan to decrease risk of relapse upon discharge and to reduce the need for readmission.  5. Psycho-social education regarding relapse prevention and self care. 6. Health care follow up as needed for medical problems. 7. Restart home medications where appropriate.   Medical Decision Making Problem Points:  Established problem, worsening (2), New problem, with no additional work-up planned (3), Review of last therapy session (1), Review of psycho-social stressors (1) and Self-limited or minor (1) Data Points:  Review or order clinical lab tests (1) Review or order medicine tests (1) Review of medication regiment & side effects (2) Review of new medications or change in dosage (2)  I certify that inpatient services furnished can reasonably be expected to improve the patient's condition.   Emme Rosenau,JANARDHAHA R. 11/19/2013, 12:41 PM

## 2013-11-19 NOTE — BHH Group Notes (Signed)
[facility] LCSW Aftercare Discharge Planning Group Note   11/19/2013 9:38 AM  Participation Quality:  Did not attend group.  Devanee Pomplun, Eulas Post

## 2013-11-20 ENCOUNTER — Inpatient Hospital Stay (HOSPITAL_COMMUNITY)
Admission: AD | Admit: 2013-11-20 | Discharge: 2013-11-25 | DRG: 392 | Disposition: A | Discharge status: Skilled Nursing Facility | Payer: Medicare Other | Source: Ambulatory Visit | Attending: Internal Medicine | Admitting: Internal Medicine

## 2013-11-20 ENCOUNTER — Inpatient Hospital Stay (HOSPITAL_COMMUNITY): Payer: Medicare Other

## 2013-11-20 ENCOUNTER — Encounter (HOSPITAL_COMMUNITY): Payer: Self-pay | Admitting: *Deleted

## 2013-11-20 DIAGNOSIS — F319 Bipolar disorder, unspecified: Secondary | ICD-10-CM | POA: Diagnosis present

## 2013-11-20 DIAGNOSIS — F1994 Other psychoactive substance use, unspecified with psychoactive substance-induced mood disorder: Secondary | ICD-10-CM

## 2013-11-20 DIAGNOSIS — D61818 Other pancytopenia: Secondary | ICD-10-CM

## 2013-11-20 DIAGNOSIS — F1010 Alcohol abuse, uncomplicated: Secondary | ICD-10-CM | POA: Diagnosis present

## 2013-11-20 DIAGNOSIS — Z801 Family history of malignant neoplasm of trachea, bronchus and lung: Secondary | ICD-10-CM

## 2013-11-20 DIAGNOSIS — F3161 Bipolar disorder, current episode mixed, mild: Secondary | ICD-10-CM

## 2013-11-20 DIAGNOSIS — E860 Dehydration: Secondary | ICD-10-CM | POA: Diagnosis present

## 2013-11-20 DIAGNOSIS — A0811 Acute gastroenteropathy due to Norwalk agent: Secondary | ICD-10-CM

## 2013-11-20 DIAGNOSIS — F411 Generalized anxiety disorder: Secondary | ICD-10-CM | POA: Diagnosis present

## 2013-11-20 DIAGNOSIS — F3160 Bipolar disorder, current episode mixed, unspecified: Secondary | ICD-10-CM | POA: Diagnosis present

## 2013-11-20 DIAGNOSIS — F1020 Alcohol dependence, uncomplicated: Secondary | ICD-10-CM

## 2013-11-20 DIAGNOSIS — Z79899 Other long term (current) drug therapy: Secondary | ICD-10-CM

## 2013-11-20 DIAGNOSIS — R197 Diarrhea, unspecified: Secondary | ICD-10-CM

## 2013-11-20 DIAGNOSIS — M25559 Pain in unspecified hip: Secondary | ICD-10-CM | POA: Diagnosis present

## 2013-11-20 DIAGNOSIS — E871 Hypo-osmolality and hyponatremia: Secondary | ICD-10-CM

## 2013-11-20 DIAGNOSIS — E876 Hypokalemia: Secondary | ICD-10-CM | POA: Diagnosis present

## 2013-11-20 DIAGNOSIS — IMO0002 Reserved for concepts with insufficient information to code with codable children: Secondary | ICD-10-CM

## 2013-11-20 DIAGNOSIS — F332 Major depressive disorder, recurrent severe without psychotic features: Secondary | ICD-10-CM

## 2013-11-20 DIAGNOSIS — D72819 Decreased white blood cell count, unspecified: Secondary | ICD-10-CM

## 2013-11-20 DIAGNOSIS — F17200 Nicotine dependence, unspecified, uncomplicated: Secondary | ICD-10-CM | POA: Diagnosis present

## 2013-11-20 DIAGNOSIS — F329 Major depressive disorder, single episode, unspecified: Secondary | ICD-10-CM | POA: Diagnosis present

## 2013-11-20 DIAGNOSIS — W19XXXA Unspecified fall, initial encounter: Secondary | ICD-10-CM | POA: Diagnosis present

## 2013-11-20 DIAGNOSIS — E538 Deficiency of other specified B group vitamins: Secondary | ICD-10-CM | POA: Diagnosis present

## 2013-11-20 DIAGNOSIS — K8020 Calculus of gallbladder without cholecystitis without obstruction: Secondary | ICD-10-CM | POA: Diagnosis present

## 2013-11-20 DIAGNOSIS — Z96659 Presence of unspecified artificial knee joint: Secondary | ICD-10-CM

## 2013-11-20 DIAGNOSIS — F32A Depression, unspecified: Secondary | ICD-10-CM

## 2013-11-20 DIAGNOSIS — Z9181 History of falling: Secondary | ICD-10-CM

## 2013-11-20 LAB — COMPREHENSIVE METABOLIC PANEL
ALK PHOS: 80 U/L (ref 39–117)
ALT: 36 U/L (ref 0–53)
AST: 48 U/L — ABNORMAL HIGH (ref 0–37)
Albumin: 3.4 g/dL — ABNORMAL LOW (ref 3.5–5.2)
BILIRUBIN TOTAL: 0.6 mg/dL (ref 0.3–1.2)
BUN: 11 mg/dL (ref 6–23)
CHLORIDE: 102 meq/L (ref 96–112)
CO2: 29 mEq/L (ref 19–32)
Calcium: 9.1 mg/dL (ref 8.4–10.5)
Creatinine, Ser: 0.8 mg/dL (ref 0.50–1.35)
GFR calc non Af Amer: >90 mL/min (ref >90)
GLUCOSE: 104 mg/dL — AB (ref 70–99)
POTASSIUM: 3.6 meq/L — AB (ref 3.7–5.3)
Sodium: 142 mEq/L (ref 137–147)
Total Protein: 6.5 g/dL (ref 6.0–8.3)

## 2013-11-20 LAB — CBC WITH DIFFERENTIAL/PLATELET
BASOS ABS: 0 10*3/uL (ref 0.0–0.1)
Basophils Relative: 1 % (ref 0–1)
Eosinophils Absolute: 0 10*3/uL (ref 0.0–0.7)
Eosinophils Relative: 0 % (ref 0–5)
HCT: 45.7 % (ref 39.0–52.0)
Hemoglobin: 15.2 g/dL (ref 13.0–17.0)
LYMPHS ABS: 0.1 10*3/uL — AB (ref 0.7–4.0)
LYMPHS PCT: 8 % — AB (ref 12–46)
MCH: 32.3 pg (ref 26.0–34.0)
MCHC: 33.3 g/dL (ref 30.0–36.0)
MCV: 97 fL (ref 78.0–100.0)
Monocytes Absolute: 0.2 10*3/uL (ref 0.1–1.0)
Monocytes Relative: 9 % (ref 3–12)
NEUTROS ABS: 1.3 10*3/uL — AB (ref 1.7–7.7)
NEUTROS PCT: 82 % — AB (ref 43–77)
PLATELETS: 142 10*3/uL — AB (ref 150–400)
RBC: 4.71 MIL/uL (ref 4.22–5.81)
RDW: 13.4 % (ref 11.5–15.5)
WBC: 1.6 10*3/uL — AB (ref 4.0–10.5)

## 2013-11-20 MED ORDER — SODIUM CHLORIDE 0.9 % IV SOLN
INTRAVENOUS | Status: DC
  Administered 2013-11-20: 21:00:00 via INTRAVENOUS

## 2013-11-20 MED ORDER — ONDANSETRON HCL 4 MG/2ML IJ SOLN
4.0000 mg | Freq: Four times a day (QID) | INTRAMUSCULAR | Status: DC | PRN
  Administered 2013-11-21: 4 mg via INTRAVENOUS

## 2013-11-20 MED ORDER — ONDANSETRON HCL 4 MG/2ML IJ SOLN
INTRAMUSCULAR | Status: AC
  Filled 2013-11-20: qty 2

## 2013-11-20 MED ORDER — IOHEXOL 300 MG/ML  SOLN: 100.0000 mL | Freq: Once | INTRAMUSCULAR | Status: AC | PRN

## 2013-11-20 MED ORDER — ACETAMINOPHEN 650 MG RE SUPP: 650.0000 mg | Freq: Four times a day (QID) | RECTAL | Status: DC | PRN

## 2013-11-20 MED ORDER — ONDANSETRON HCL 4 MG PO TABS: 4.0000 mg | ORAL_TABLET | Freq: Four times a day (QID) | ORAL | Status: DC | PRN

## 2013-11-20 MED ORDER — HEPARIN SODIUM (PORCINE) 5000 UNIT/ML IJ SOLN
5000.0000 [IU] | Freq: Three times a day (TID) | INTRAMUSCULAR | Status: DC
  Administered 2013-11-20 – 2013-11-25 (×14): 5000 [IU] via SUBCUTANEOUS
  Filled 2013-11-20 (×17): qty 1

## 2013-11-20 MED ORDER — VALPROATE SODIUM 500 MG/5ML IV SOLN
500.0000 mg | Freq: Two times a day (BID) | INTRAVENOUS | Status: DC
  Administered 2013-11-20 – 2013-11-22 (×4): 500 mg via INTRAVENOUS
  Filled 2013-11-20 (×5): qty 5

## 2013-11-20 MED ORDER — IOHEXOL 300 MG/ML  SOLN
50.0000 mL | Freq: Once | INTRAMUSCULAR | Status: AC | PRN
  Administered 2013-11-20: 50 mL via ORAL

## 2013-11-20 MED ORDER — MORPHINE SULFATE 2 MG/ML IJ SOLN: 2.0000 mg | INTRAMUSCULAR | Status: DC | PRN

## 2013-11-20 MED ORDER — ACETAMINOPHEN 325 MG PO TABS: 650.0000 mg | ORAL_TABLET | Freq: Four times a day (QID) | ORAL | Status: DC | PRN

## 2013-11-20 MED ORDER — ONDANSETRON 4 MG PO TBDP
4.0000 mg | ORAL_TABLET | Freq: Once | ORAL | Status: AC
  Administered 2013-11-20: 4 mg via ORAL
  Filled 2013-11-20 (×2): qty 1

## 2013-11-20 NOTE — BHH Group Notes (Signed)
[place] Group Notes:  (Nursing/MHT/Case Management/Adjunct)  Date:  11/20/2013  Time:  11:04 AM  Type of Therapy:  Nurse Education  Participation Level:  Did Not Attend  Participation Quality:  Inattentive  Affect:  Depressed  Cognitive:  Lacking  Insight:  None  Engagement in Group:  None  Modes of Intervention:  Discussion  Summary of Progress/Problems:Pt did not attend group due to not feeling good and on contact isolation  Delman Kitten 11/20/2013, 11:04 AM

## 2013-11-20 NOTE — Progress Notes (Signed)
Pt attended AA group this evening.  
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
## 2013-11-20 NOTE — Progress Notes (Addendum)
Thos is incontinent of diarrhea ( he and MHT ) report   6  times in a row   ''    , immed after lunch. He is medicated with immodium per MD order.

## 2013-11-20 NOTE — Progress Notes (Signed)
Pt c/o nausea and vomiting. Pt stated he vomited in the bathroom and trash can by his bed. In house NP notified. One time dose of zofran ordered. Medication administered as ordered. Pt resting in bed. Will continue to monitor.

## 2013-11-20 NOTE — Progress Notes (Signed)
D Pt cont to be incontinent of large amts foul-smelling watery diarrhea. Pt has reached 6 doses of immodium but he cont to beg for more, saying " please just give me something.I  feel so bad...". He is dehydrated. Poor skin integrity. Sunken eyes and dry mucous membranes. 92/56 77  RR 20 T 99.2. Dr. Julaine Hua over to assess pt and  States pt needs admission to [facility] and NP made arrangements with bed control and pt states he does not want his family called and this nurse called [facility] and spoke with cyndy ZRN and gave report andthen called non emergency 911 to arrange transport of pt to hospital

## 2013-11-20 NOTE — Progress Notes (Signed)
Shawn [name]:Shawn [name] D  1455 Pt is seen resting quietly in his bed. HE picked at his lunch, he has been sipping ( only when he is assissted) his fluids. He remains dry and grey. He speeks slowly and softly. He is observed getting OOB around 1230 and is again, incontinent of large amt foul-smelling diarrhea and he is then medicated at 1255 with immodium, per MD order.    A His affect is flat, blunted and  disintereaseted and he says " I feel awful".    R Safety is in place andpoc cont.

## 2013-11-20 NOTE — Progress Notes (Addendum)
D 0745 Pt noted to be incontinent of foul smelling diarrhea. He is weak, unsteady on his feet, said " I feel awful" " I've been sick all night"... " I can't stop with this diarrhea...please give me something".    A MHT assisted pt to bathe in tub, and assissted to get dressed and cleaned. Pt given large amts of fluids ( water, gatorade and ginger ale) and requests fruit bars for breakfast. AC and charge nurse notified of pt's condition, roommate moved out of pt's room and pt placed  on contact precautions. NP aware and pt placed on 1:1 and MHT placed with sitter.    R Pt given AM meds along with immodium and vistaril , for diarrhea and nausea , 1:1 initiated and pt  resting in bed.BP 100/62 lying RR 21 HRR 70.PDuke RN [facility]

## 2013-11-20 NOTE — Progress Notes (Signed)
1130 1:1 D Pt is resting in bed quietly. He reports sleeping , feeling " tires and washed out" and remains unsteady on his feet and a HIGH fall risk.    A He says his stomach feels " all right " and denies any vomiting or diarrhea since 0730. Foul smell observed in his room.    R He denies SI. MHT 1:1 at his side and safety maintaiend.

## 2013-11-20 NOTE — Discharge Summary (Signed)
Physician Discharge Summary Note  Patient:  Shawn [name] is an 63 y.o., male MRN:  [id number] DOB:  04/12/1951 Patient phone:  [phone] (home)  Patient address:   [address] [place] [address],  Total Time spent with patient: 30 minutes  Date of Admission:  11/17/2013 Date of Discharge: 11/20/2013  Reason for Admission:  Shawn [name] is a 63 y.o. male who presents voluntarily to [facility] due to complications from a fall that happened today. Pt sustained visible bruises and cuts to bilateral arms from a previous fall and today, he is also c/o hip and rib pain. Per notes from examining PA, states that pt is a poor historian, however he is able to answer most questions posed by this writer w/o difficulty but he is confused on some dates and times. Pt confirms that he is SI w/no current plan or intent to harm self--"I don't have a plan yet." When asked fif he feels safe returning home, he says--"I may eventually hurt myself." Pt lives alone and tells this write that he has no support from family, though he has a sister who lives in town. Pt states he's been having SI thoughts for approx 2-3 mos due to the changes in his health--he has knee problems, falls, negative changes due to falls. He has no past hx of SI attempts but Pt endorses depressive sxs: insomnia(2-3 hrs sleep, daily); not eating(25 pf wt loss); isolation and anhedonia. Pt is grooming less due to because of hand tremors that he says started 5 days ago--"I was going to shave today, but I was afraid." Pt reports hearing voices for several years with command to harm self--"I heard voices this morning.". Pt admits he has a problems with alcohol, consuming 1 pint of alcohol, 2-3x's a week, states last time he drank was 2 days ago. He denies he had any alcohol today, but BAL was 45. Pt is visibly anxious and restless during the interview with this Probation officer. He is ambulatory but has an unsteady gait, he has hold to  railings to steady himself. Pt is pleasant, cooperative and soft spoken. Pt has past inpt admission in [place], [place], approx 20 yrs ago after his separation from his wife--"I just lost it." Pt hx shows mutliple admits with other facilities. He is currently seeking psychiatric services with Dr. Lenore Cordia.    Discharge Diagnoses: Active Problems:   Bipolar disorder, current episode mixed, mild   Alcohol dependence   Depression   Psychiatric Specialty Exam: Physical Exam  Constitutional: He appears lethargic. He appears cachectic. He is sleeping. He has a sickly appearance.  Eyes:  Dry mucous  GI: There is tenderness.  Hyperactive   Neurological: He appears lethargic.  Fatigue and generalized weakness  Skin: There is pallor.    Review of Systems  Constitutional: Positive for chills and malaise/fatigue.  Gastrointestinal: Positive for nausea, vomiting, abdominal pain and diarrhea.  Neurological: Positive for weakness.    Blood pressure 115/74, pulse 53, temperature 97.7 F (36.5 C), temperature source Oral, resp. rate 18, height 6\' 1"  (1.854 m), weight 72.576 kg (160 lb), SpO2 98.00%.Body mass index is 21.11 kg/(m^2).  General Appearance: Disheveled  Eye Contact::  Minimal  Speech:  Slow  Volume:  Decreased  Mood:  NA  Affect:  Depressed and Flat  Thought Process:  Negative  Orientation:  NA  Thought Content:  NA  Suicidal Thoughts:  No  Homicidal Thoughts:  No  Memory:  NA  Judgement:  NA  Insight:  NA  Psychomotor Activity:  Decreased  Concentration:  NA  Recall:  NA  Fund of Knowledge:NA  Language: NA  Akathisia:  NA  Handed:  Right  AIMS (if indicated):     Assets:  Desire for Improvement  Sleep:  Number of Hours: 5.75    Past Psychiatric History: Diagnosis:  Hospitalizations:  Outpatient Care:  Substance Abuse Care:  Self-Mutilation:  Suicidal Attempts:  Violent Behaviors:   Musculoskeletal: Strength & Muscle Tone: decreased Gait & Station:  unsteady Patient leans: N/A  DSM5:  Schizophrenia Disorders:   Obsessive-Compulsive Disorders:   Trauma-Stressor Disorders:  Acute Stress Disorder (308.3) Substance/Addictive Disorders:  Alcohol Intoxication with Use Disorder - Mild ((F10.129) Depressive Disorders:  Major Depressive Disorder - Severe (296.23)  Axis Diagnosis:   AXIS I:  Major Depression, Recurrent severe, Substance Induced Mood Disorder and Alcohol dependence  AXIS II:  No diagnosis AXIS III:   Past Medical History  Diagnosis Date  . Osteoarthritis X years    left knee.  Distant hx (age 76) "dislocated" knee and required surgery, then crush injury to patella required knee cap removal.  . Diverticular disease     "itis" x 2 episodes (last was about 2007)  . Tobacco dependence   . Multiple rib fractures 10/09/10    s/p fall down a ravine--9th and 10th on right, contusion of left 7th and 8th ribs  . Fracture of metatarsal bone(s), closed     3rd, 4th, 5th mid shaft on left foot  . History of substance abuse     cocaine, marijuana, acid, alcohol (in remission since 2010)  . Solar dermatitis     face and ears  . Bipolar disorder     Has had multiple psych hospitalization in the past, most recently at [facility] 01/31/11-02/04/11.  Says meds made him emotionally blunted. (lamictal and wellbutrin).  . Alcoholism     "Dry" since 2002; relapse 2014  . Depression   . Anxiety    AXIS IV:  economic problems AXIS V:  41-50 serious symptoms  Level of Care:  [facility]  Hospital Course:  Medication management. Pt is being transferred to [facility] for IV hydration and R/O infectious diarrhea.   Consults:  GI  Significant Diagnostic Studies:  None  Discharge Vitals:   Blood pressure 115/74, pulse 53, temperature 97.7 F (36.5 C), temperature source Oral, resp. rate 18, height 6\' 1"  (1.854 m), weight 72.576 kg (160 lb), SpO2 98.00%. Body mass index is 21.11 kg/(m^2). Lab Results:   No results found for this or any  previous visit (from the past 72 hour(s)).  Physical Findings: AIMS: Facial and Oral Movements Muscles of Facial Expression: None, normal Lips and Perioral Area: None, normal Jaw: None, normal Tongue: None, normal,Extremity Movements Upper (arms, wrists, hands, fingers): None, normal Lower (legs, knees, ankles, toes): None, normal, Trunk Movements Neck, shoulders, hips: None, normal, Overall Severity Severity of abnormal movements (highest score from questions above): None, normal Incapacitation due to abnormal movements: None, normal Patient's awareness of abnormal movements (rate only patient's report): No Awareness, Dental Status Current problems with teeth and/or dentures?: No Does patient usually wear dentures?: No  CIWA:    COWS:     Psychiatric Specialty Exam: See Psychiatric Specialty Exam and Suicide Risk Assessment completed by Attending Physician prior to discharge.  Discharge destination:  Other:  [facility].   Is patient on multiple antipsychotic therapies at discharge:  No   Has Patient had three or more failed trials  of antipsychotic monotherapy by history:  No  Recommended Plan for Multiple Antipsychotic Therapies: NA     Medication List    ASK your doctor about these medications     Indication   divalproex 500 MG 24 hr tablet  Commonly known as:  DEPAKOTE ER  Take 1 tablet (500 mg total) by mouth daily. For stabilization   Indication:  Mood stabilization     naproxen 375 MG tablet  Commonly known as:  NAPROSYN  Take 1 tablet (375 mg total) by mouth 2 (two) times daily.      traMADol 50 MG tablet  Commonly known as:  ULTRAM  Take 2 tablets (100 mg total) by mouth every 6 (six) hours as needed.            Follow-up Information   Follow up with [facility] On 11/24/2013. (Walk in on for a hospital discharge appointment. Walk in clinic is Monday - Friday 8 am - 3 pm. They will than schedule you for medication mangement and therapy. )    Contact  information:   [address], [place] [address] Phone: [phone] Fax: [phone]      Follow-up recommendations:  Activity:  OOB with assistance Diet:  Clear liquids advance as tolerated.  Comments:   Total Discharge Time:  Greater than 30 minutes.  Signed: Nanci Pina 11/20/2013, 5:39 PM  Coralyn Helling, M.D.  11/22/2013 12:00 AM

## 2013-11-20 NOTE — Progress Notes (Signed)
Patient ID: Shawn [name], male   DOB: 1951-06-14, 63 y.o.   MRN: [id number] D: Patient stated he is doing well. Pt is observed sitting in dayroom interacting with peers. Pt reports decreased anxiety and depressive symptoms. Pt denies suicidal /homicidal ideation intent and plan. Pt denies auditory and visual hallucination. Pt attended evening AA on [address] hall. Pt denies any needs or concerns. Cooperative with assessment. No acute distress noted at this time.   A: Met with pt 1:1. Medications administered as prescribed. Writer encouraged pt to discuss feelings. Pt encouraged to come to staff with any questions or concerns.   R: Patient is safe on the unit. He is complaint with medications and denies any adverse reaction. Continue current POC.

## 2013-11-21 DIAGNOSIS — E860 Dehydration: Secondary | ICD-10-CM

## 2013-11-21 DIAGNOSIS — R197 Diarrhea, unspecified: Secondary | ICD-10-CM

## 2013-11-21 DIAGNOSIS — D72819 Decreased white blood cell count, unspecified: Secondary | ICD-10-CM

## 2013-11-21 DIAGNOSIS — F1010 Alcohol abuse, uncomplicated: Secondary | ICD-10-CM

## 2013-11-21 LAB — CBC
HCT: 40.8 % (ref 39.0–52.0)
HEMOGLOBIN: 14.2 g/dL (ref 13.0–17.0)
MCH: 33.2 pg (ref 26.0–34.0)
MCHC: 34.8 g/dL (ref 30.0–36.0)
MCV: 95.3 fL (ref 78.0–100.0)
PLATELETS: 139 10*3/uL — AB (ref 150–400)
RBC: 4.28 MIL/uL (ref 4.22–5.81)
RDW: 13.4 % (ref 11.5–15.5)
WBC: 1.4 10*3/uL — CL (ref 4.0–10.5)

## 2013-11-21 LAB — BASIC METABOLIC PANEL
BUN: 9 mg/dL (ref 6–23)
CHLORIDE: 103 meq/L (ref 96–112)
CO2: 24 meq/L (ref 19–32)
Calcium: 8.3 mg/dL — ABNORMAL LOW (ref 8.4–10.5)
Creatinine, Ser: 0.7 mg/dL (ref 0.50–1.35)
GFR calc Af Amer: >90 mL/min (ref >90)
GFR calc non Af Amer: >90 mL/min (ref >90)
GLUCOSE: 92 mg/dL (ref 70–99)
POTASSIUM: 2.8 meq/L — AB (ref 3.7–5.3)
SODIUM: 138 meq/L (ref 137–147)

## 2013-11-21 LAB — MAGNESIUM: Magnesium: 1.5 mg/dL (ref 1.5–2.5)

## 2013-11-21 LAB — CBC WITH DIFFERENTIAL/PLATELET
Basophils Absolute: 0 10*3/uL (ref 0.0–0.1)
Basophils Relative: 1 % (ref 0–1)
EOS ABS: 0 10*3/uL (ref 0.0–0.7)
EOS PCT: 1 % (ref 0–5)
HCT: 41.7 % (ref 39.0–52.0)
Hemoglobin: 14.5 g/dL (ref 13.0–17.0)
LYMPHS ABS: 0.3 10*3/uL — AB (ref 0.7–4.0)
Lymphocytes Relative: 18 % (ref 12–46)
MCH: 33.4 pg (ref 26.0–34.0)
MCHC: 34.8 g/dL (ref 30.0–36.0)
MCV: 96.1 fL (ref 78.0–100.0)
MONO ABS: 0.2 10*3/uL (ref 0.1–1.0)
Monocytes Relative: 14 % — ABNORMAL HIGH (ref 3–12)
NEUTROS PCT: 66 % (ref 43–77)
Neutro Abs: 1 10*3/uL — ABNORMAL LOW (ref 1.7–7.7)
Platelets: 149 10*3/uL — ABNORMAL LOW (ref 150–400)
RBC: 4.34 MIL/uL (ref 4.22–5.81)
RDW: 13.2 % (ref 11.5–15.5)
WBC: 1.5 10*3/uL — ABNORMAL LOW (ref 4.0–10.5)

## 2013-11-21 LAB — HIV ANTIBODY (ROUTINE TESTING W REFLEX): HIV: NONREACTIVE

## 2013-11-21 LAB — VITAMIN B12: Vitamin B-12: 188 pg/mL — ABNORMAL LOW (ref 211–911)

## 2013-11-21 LAB — CLOSTRIDIUM DIFFICILE BY PCR: Toxigenic C. Difficile by PCR: NEGATIVE

## 2013-11-21 MED ORDER — ACETAMINOPHEN 650 MG RE SUPP: 650.0000 mg | Freq: Four times a day (QID) | RECTAL | Status: DC | PRN

## 2013-11-21 MED ORDER — POTASSIUM CHLORIDE IN NACL 20-0.9 MEQ/L-% IV SOLN
INTRAVENOUS | Status: DC
Start: 2013-11-21 — End: 2013-11-25
  Administered 2013-11-21 – 2013-11-23 (×4): via INTRAVENOUS
  Administered 2013-11-23: 75 mL via INTRAVENOUS
  Administered 2013-11-24: 11:00:00 via INTRAVENOUS
  Filled 2013-11-21 (×10): qty 1000

## 2013-11-21 MED ORDER — SODIUM CHLORIDE 0.9 % IV SOLN
INTRAVENOUS | Status: DC
  Filled 2013-11-21: qty 1000

## 2013-11-21 MED ORDER — ONDANSETRON HCL 4 MG/2ML IJ SOLN: 4.0000 mg | Freq: Four times a day (QID) | INTRAMUSCULAR | Status: DC | PRN

## 2013-11-21 MED ORDER — POTASSIUM CHLORIDE 10 MEQ/100ML IV SOLN
10.0000 meq | INTRAVENOUS | Status: AC
  Administered 2013-11-21 (×3): 10 meq via INTRAVENOUS
  Filled 2013-11-21 (×3): qty 100

## 2013-11-21 MED ORDER — ADULT MULTIVITAMIN W/MINERALS CH
1.0000 | ORAL_TABLET | Freq: Every day | ORAL | Status: DC
  Administered 2013-11-21 – 2013-11-25 (×5): 1 via ORAL
  Filled 2013-11-21 (×5): qty 1

## 2013-11-21 MED ORDER — ACETAMINOPHEN 325 MG PO TABS
650.0000 mg | ORAL_TABLET | Freq: Four times a day (QID) | ORAL | Status: DC | PRN
  Administered 2013-11-24: 650 mg via ORAL
  Filled 2013-11-21: qty 2

## 2013-11-21 MED ORDER — VITAMIN B-1 100 MG PO TABS
100.0000 mg | ORAL_TABLET | Freq: Every day | ORAL | Status: DC
  Administered 2013-11-22 – 2013-11-25 (×4): 100 mg via ORAL
  Filled 2013-11-21 (×4): qty 1

## 2013-11-21 MED ORDER — THIAMINE HCL 100 MG/ML IJ SOLN
100.0000 mg | Freq: Every day | INTRAMUSCULAR | Status: DC
  Filled 2013-11-21 (×4): qty 1

## 2013-11-21 MED ORDER — FOLIC ACID 1 MG PO TABS
1.0000 mg | ORAL_TABLET | Freq: Every day | ORAL | Status: DC
  Administered 2013-11-21 – 2013-11-25 (×5): 1 mg via ORAL
  Filled 2013-11-21 (×5): qty 1

## 2013-11-21 MED ORDER — SACCHAROMYCES BOULARDII 250 MG PO CAPS
250.0000 mg | ORAL_CAPSULE | Freq: Two times a day (BID) | ORAL | Status: DC
  Administered 2013-11-21 – 2013-11-25 (×8): 250 mg via ORAL
  Filled 2013-11-21 (×9): qty 1

## 2013-11-21 MED ORDER — MORPHINE SULFATE 2 MG/ML IJ SOLN
2.0000 mg | INTRAMUSCULAR | Status: DC | PRN
  Administered 2013-11-21 – 2013-11-25 (×10): 2 mg via INTRAVENOUS
  Filled 2013-11-21 (×10): qty 1

## 2013-11-21 MED ORDER — ONDANSETRON HCL 4 MG PO TABS: 4.0000 mg | ORAL_TABLET | Freq: Four times a day (QID) | ORAL | Status: DC | PRN

## 2013-11-21 NOTE — Progress Notes (Signed)
CRITICAL VALUE ALERT  Critical value received:  K+ OF 2.8  Date of notification:  11/21/2013  Time of notification: 0630  Critical value read back:yes  Nurse who received alert:  Carver Fila RN  MD notified (1st page):  Kathline Magic, NP  Time of first page:  06:38  MD notified (2nd page):  Time of second page:  Responding MD: Kathline Magic  Time MD responded:  [phone]

## 2013-11-21 NOTE — Progress Notes (Signed)
CRITICAL VALUE ALERT  Critical value received:  WBC 1.4  Date of notification:  11/21/2013  Time of notification:  0600  Critical value read back:yes  Nurse who received alert:  Carver Fila RN  MD notified (1st page):  Kathline Magic NP  Time of first page:  0605  MD notified (2nd page):  Time of second page:  Responding MD:  [id number]  Time MD responded: Kathline Magic

## 2013-11-21 NOTE — Progress Notes (Signed)
PATIENT DETAILS Name: Shawn [name] Age: 63 y.o. Sex: male Date of Birth: June 29, 1951 Admit Date: 11/20/2013 Admitting Physician Donne Hazel, MD JSE:[id number],HYWVPX H, MD  Subjective: No vomiting, however continues to have profuse diarrhea.  Assessment/Plan: Active Problems: Nausea-vomiting with diarrhea - Workup underway, given leukopenia-suspect a viral syndrome - Continue with supportive care, await stool cultures, stool C. difficile PCR. We'll also send out a stool GI pathogen panel.  Dehydration - Clinically euvolemic, continue with IV fluids given ongoing diarrhea  Leukopenia -? Secondary to viral syndrome-which would explain nausea vomiting and diarrhea - Check HIV, vitamin B12.  Mild thrombocytopenia - Could be secondary to viral syndrome-continue to monitor.  Cholelithiasis - Suspect not responsible for any of the patient's presenting symptoms. No abdominal pain. Patient can pursue elective cholecystectomy on discharge.  Alcohol use - Lasting approximately a week ago. Very minimally tremulous - As needed Ativan, start folate, MVI and thiamine  Bipolar disorder - Currently without any suicidal or homicidal ideation. - Continue Depakote  Hip pain - X-ray of the bilateral hip done on 3/15 while in the emergency room negative for fractures. - To provide supportive care. Able to ambulate and weight on the leg-patient able to walk to the bathroom.  Nondisplaced left anterior lateral ninth rib fracture - Supportive care.  Disposition: Remain inpatient  DVT Prophylaxis: Prophylactic Heparin-watch platelets  Code Status: Full code   Family Communication None at bedside  Procedures:  None  CONSULTS:  None  Time spent 40 minutes-which includes 50% of the time with face-to-face with patient/ family and coordinating care related to the above assessment and plan.    MEDICATIONS: Scheduled Meds: . heparin  5,000 Units Subcutaneous 3 times  per day  . valproate sodium  500 mg Intravenous Q12H   Continuous Infusions: . sodium chloride 100 mL/hr at 11/20/13 2128   PRN Meds:.acetaminophen, acetaminophen, morphine injection, ondansetron (ZOFRAN) IV, ondansetron  Antibiotics: Anti-infectives   None       PHYSICAL EXAM: Vital signs in last 24 hours: Filed Vitals:   11/20/13 1913 11/21/13 0559 11/21/13 1352  BP: 137/84 125/76 126/50  Pulse: 81 59 60  Temp: 97.9 F (36.6 C) 97.8 F (36.6 C) 98.2 F (36.8 C)  TempSrc: Oral Oral Oral  Resp: 20 18 20   Height: 6\' 1"  (1.854 m)    Weight: 71.8 kg (158 lb 4.6 oz)    SpO2: 98% 98% 97%    Weight change:  Filed Weights   11/20/13 1913  Weight: 71.8 kg (158 lb 4.6 oz)   Body mass index is 20.89 kg/(m^2).   Gen Exam: Awake and alert with clear speech.   Neck: Supple, No JVD.   Chest: B/L Clear.   CVS: S1 S2 Regular, no murmurs.  Abdomen: soft, BS +, non tender, non distended.  Extremities: no edema, lower extremities warm to touch Neurologic: Non Focal.   Skin: No Rash.  Wounds: N/A.    Intake/Output from previous day:  Intake/Output Summary (Last 24 hours) at 11/21/13 1606 Last data filed at 11/21/13 1230  Gross per 24 hour  Intake   1180 ml  Output    425 ml  Net    755 ml     LAB RESULTS: CBC  Recent Labs Lab 11/16/13 1810 11/20/13 1844 11/21/13 0523 11/21/13 1109  WBC 4.4 1.6* 1.4* 1.5*  HGB 17.3* 15.2 14.2 14.5  HCT 48.7 45.7 40.8 41.7  PLT 196 142* 139* 149*  MCV 95.3  97.0 95.3 96.1  MCH 33.9 32.3 33.2 33.4  MCHC 35.5 33.3 34.8 34.8  RDW 13.6 13.4 13.4 13.2  LYMPHSABS 0.6* 0.1*  --  0.3*  MONOABS 0.4 0.2  --  0.2  EOSABS 0.1 0.0  --  0.0  BASOSABS 0.0 0.0  --  0.0    Chemistries   Recent Labs Lab 11/16/13 1810 11/20/13 1844 11/21/13 0523  NA 144 142 138  K 3.3* 3.6* 2.8*  CL 100 102 103  CO2 27 29 24   GLUCOSE 93 104* 92  BUN 9 11 9   CREATININE 0.76 0.80 0.70  CALCIUM 9.5 9.1 8.3*  MG  --   --  1.5    CBG: No results  found for this basename: GLUCAP,  in the last 168 hours  GFR Estimated Creatinine Clearance: 93.5 ml/min (by C-G formula based on Cr of 0.7).  Coagulation profile No results found for this basename: INR, PROTIME,  in the last 168 hours  Cardiac Enzymes No results found for this basename: CK, CKMB, TROPONINI, MYOGLOBIN,  in the last 168 hours  No components found with this basename: POCBNP,  No results found for this basename: DDIMER,  in the last 72 hours No results found for this basename: HGBA1C,  in the last 72 hours No results found for this basename: CHOL, HDL, LDLCALC, TRIG, CHOLHDL, LDLDIRECT,  in the last 72 hours No results found for this basename: TSH, T4TOTAL, FREET3, T3FREE, THYROIDAB,  in the last 72 hours No results found for this basename: VITAMINB12, FOLATE, FERRITIN, TIBC, IRON, RETICCTPCT,  in the last 72 hours No results found for this basename: LIPASE, AMYLASE,  in the last 72 hours  Urine Studies No results found for this basename: UACOL, UAPR, USPG, UPH, UTP, UGL, UKET, UBIL, UHGB, UNIT, UROB, ULEU, UEPI, UWBC, URBC, UBAC, CAST, CRYS, UCOM, BILUA,  in the last 72 hours  MICROBIOLOGY: No results found for this or any previous visit (from the past 240 hour(s)).  RADIOLOGY STUDIES/RESULTS: Dg Chest 1 View  11/13/2013   CLINICAL DATA:  Altered mental status.  Alcohol  EXAM: CHEST - 1 VIEW  COMPARISON:  11/13/2011  FINDINGS: The heart size and mediastinal contours are within normal limits. Both lungs are clear. The visualized skeletal structures are unremarkable.  IMPRESSION: No active disease.   Electronically Signed   By: Franchot Gallo M.D.   On: 11/13/2013 22:19   Dg Ribs Unilateral W/chest Left  11/16/2013   CLINICAL DATA:  Left anterior rib pain  EXAM: LEFT RIBS AND CHEST - 3+ VIEW  COMPARISON:  DG RIBS UNILATERAL W/CHEST*L* dated 11/14/2013  FINDINGS: There is a nondisplaced fracture of the left anterolateral ninth rib. There is no evidence of pneumothorax or  pleural effusion. Both lungs are clear. The lungs are hyperinflated likely secondary to COPD. Heart size and mediastinal contours are within normal limits.  IMPRESSION: Nondisplaced fracture of the left anterolateral ninth rib.   Electronically Signed   By: Kathreen Devoid   On: 11/16/2013 18:07   Dg Ribs Unilateral W/chest Left  11/14/2013   CLINICAL DATA:  Chest pain following injury  EXAM: LEFT RIBS AND CHEST - 3+ VIEW  COMPARISON:  None.  FINDINGS: No fracture or other bone lesions are seen involving the ribs. There is no evidence of pneumothorax or pleural effusion. Both lungs are clear. Heart size and mediastinal contours are within normal limits.  IMPRESSION: No acute abnormality noted.   Electronically Signed   By: Linus Mako.D.  On: 11/14/2013 08:39   Dg Hip Bilateral W/pelvis  11/16/2013   CLINICAL DATA:  Bilateral hip pain, fall  EXAM: BILATERAL HIP WITH PELVIS - 4+ VIEW  COMPARISON:  11/14/2013  FINDINGS: IM nail with compression screw in proximal left femur post ORIF.  Hardware intact.  Diffuse osseous demineralization.  Mild narrowing of the hip joints bilaterally.  Slight narrowing of left SI joint versus right, unchanged.  No acute fracture, dislocation or bone destruction.  IMPRESSION: Osseous demineralization with mild degenerative changes of the hip joints and evidence of prior left hip ORIF.  No acute abnormalities.   Electronically Signed   By: Lavonia Dana M.D.   On: 11/16/2013 18:06   Dg Hip Bilateral W/pelvis  11/14/2013   CLINICAL DATA:  Hip pain  EXAM: BILATERAL HIP WITH PELVIS - 4+ VIEW  COMPARISON:  MR HIP*L* W/O CM dated 08/06/2012; DG HIP OPERATIVE*L* dated 08/06/2012  FINDINGS: Dynamic left femoral nail in place. Bones are subjectively osteopenic. No displaced pelvic fracture is identified. Both femoral heads appear properly located. No proximal femoral fracture is identified on either side.  IMPRESSION: No fracture or dislocation identified.   Electronically Signed   By:  Conchita [place] M.D.   On: 11/14/2013 08:45   Ct Head Wo Contrast  11/16/2013   CLINICAL DATA:  Multiple falls this week  EXAM: CT HEAD WITHOUT CONTRAST  TECHNIQUE: Contiguous axial images were obtained from the base of the skull through the vertex without intravenous contrast.  COMPARISON:  CT HEAD W/O CM dated 11/14/2013; CT HEAD W/O CM dated 09/15/2013  FINDINGS: No hemorrhage or extra-axial fluid. No skull fracture. Moderate diffuse atrophy an low attenuation in the deep white matter. Tiny lacunar infarct right frontoparietal region above the level of the ventricles image number 21, measuring less than a cm, stable. No vascular territory infarct.  IMPRESSION: Chronic involutional change.  No acute findings.   Electronically Signed   By: Skipper Cliche M.D.   On: 11/16/2013 18:10   Ct Head Wo Contrast  11/14/2013   CLINICAL DATA:  Headache, fall  EXAM: CT HEAD WITHOUT CONTRAST  TECHNIQUE: Contiguous axial images were obtained from the base of the skull through the vertex without intravenous contrast.  COMPARISON:  CT HEAD W/O CM dated 09/15/2013  FINDINGS: Mild cortical volume loss noted with proportional ventricular prominence. Areas of periventricular white matter hypodensity are most compatible with small vessel ischemic change. No acute hemorrhage, infarct, or mass lesion is identified. No midline shift. Orbits are grossly unremarkable. Mild ethmoid mucoperiosteal thickening noted. No skull fracture.  IMPRESSION: No acute intracranial finding.  Remote findings as above.   Electronically Signed   By: Conchita [place] M.D.   On: 11/14/2013 08:27   Ct Abdomen Pelvis W Contrast  11/21/2013   CLINICAL DATA:  Diarrhea  EXAM: CT ABDOMEN AND PELVIS WITH CONTRAST  TECHNIQUE: Multidetector CT imaging of the abdomen and pelvis was performed using the standard protocol following bolus administration of intravenous contrast.  CONTRAST:  85mL OMNIPAQUE IOHEXOL 300 MG/ML  SOLN  COMPARISON:  None.  FINDINGS: Deep and  atelectasis at the lung bases.  Diffuse hepatic steatosis.  The gallbladder is decompressed. Heterogeneous densities in the fundus or worrisome for stones.  Spleen, pancreas, adrenal glands are within normal limits.  There simple cyst in the kidneys. Focal scarring in the interpolar region and lower pole of the left kidney. Some hypodensities in the kidneys are too small to characterize.  No focal bowel wall thickening of the  colon. No disproportionate dilatation of small bowel. Lower abdominal hernia is noted with a very wide neck. The hernia sac contains primarily small bowel loops.  No free-fluid.  No abnormal retroperitoneal adenopathy.  No acute bony deformity. Moderate degenerative change in the lumbar spine. Multifactorial spinal stenosis is evident and L4-5.  Mild atherosclerotic changes of the visceral vasculature. SMA is patent. Celiac and IMA are grossly patent.  IMPRESSION: Findings are worrisome for cholelithiasis. Ultrasound can be performed to further characterize.  Lower abdominal hernia containing small bowel loops without evidence of small bowel obstruction.   Electronically Signed   By: Maryclare Bean M.D.   On: 11/21/2013 08:10    Oren Binet, MD  [facility] Pager:[phone]  If 7PM-7AM, please contact night-coverage [email or web address] Password [email or web address] 11/21/2013, 4:06 PM   LOS: 1 day

## 2013-11-21 NOTE — Progress Notes (Signed)
[facility] Adult Case Management Discharge Plan :  Will you be returning to the same living situation after discharge: Yes At discharge, do you have transportation home?:Yes Do you have the ability to pay for your medications:Yes  Release of information consent forms completed and in the chart;  Patient's signature needed at discharge.  Patient to Follow up at: Follow-up Information   Follow up with [facility] On 11/24/2013. (Walk in on for a hospital discharge appointment. Walk in clinic is Monday - Friday 8 am - 3 pm. They will than schedule you for medication mangement and therapy. )    Contact information:   [address], [place] [address] Phone: [phone] Fax: [phone]      Patient denies SI/HI:   Yes,  denied both    Safety Planning and Suicide Prevention discussed:  Yes,  with patient on 3/19 as it was noted that pt refused to provide consent to contact significant others  Lyla Glassing 11/21/2013, 9:22 AM

## 2013-11-22 DIAGNOSIS — F3161 Bipolar disorder, current episode mixed, mild: Secondary | ICD-10-CM

## 2013-11-22 DIAGNOSIS — F1020 Alcohol dependence, uncomplicated: Secondary | ICD-10-CM

## 2013-11-22 LAB — COMPREHENSIVE METABOLIC PANEL
ALK PHOS: 61 U/L (ref 39–117)
ALT: 21 U/L (ref 0–53)
AST: 23 U/L (ref 0–37)
Albumin: 2.5 g/dL — ABNORMAL LOW (ref 3.5–5.2)
BUN: 4 mg/dL — AB (ref 6–23)
CO2: 26 mEq/L (ref 19–32)
Calcium: 8.1 mg/dL — ABNORMAL LOW (ref 8.4–10.5)
Chloride: 107 mEq/L (ref 96–112)
Creatinine, Ser: 0.76 mg/dL (ref 0.50–1.35)
GLUCOSE: 83 mg/dL (ref 70–99)
POTASSIUM: 3.9 meq/L (ref 3.7–5.3)
SODIUM: 140 meq/L (ref 137–147)
Total Bilirubin: 0.3 mg/dL (ref 0.3–1.2)
Total Protein: 4.9 g/dL — ABNORMAL LOW (ref 6.0–8.3)

## 2013-11-22 LAB — GI PATHOGEN PANEL BY PCR, STOOL
C difficile toxin A/B: NEGATIVE
CRYPTOSPORIDIUM BY PCR: NEGATIVE
Campylobacter by PCR: NEGATIVE
E COLI (STEC): NEGATIVE
E COLI 0157 BY PCR: NEGATIVE
E coli (ETEC) LT/ST: NEGATIVE
G lamblia by PCR: NEGATIVE
Norovirus GI/GII: POSITIVE
Rotavirus A by PCR: NEGATIVE
Salmonella by PCR: NEGATIVE
Shigella by PCR: NEGATIVE

## 2013-11-22 LAB — MAGNESIUM: Magnesium: 1.6 mg/dL (ref 1.5–2.5)

## 2013-11-22 LAB — CBC
HEMATOCRIT: 38.9 % — AB (ref 39.0–52.0)
HEMOGLOBIN: 13.3 g/dL (ref 13.0–17.0)
MCH: 33.2 pg (ref 26.0–34.0)
MCHC: 34.2 g/dL (ref 30.0–36.0)
MCV: 97 fL (ref 78.0–100.0)
Platelets: 133 10*3/uL — ABNORMAL LOW (ref 150–400)
RBC: 4.01 MIL/uL — ABNORMAL LOW (ref 4.22–5.81)
RDW: 13.2 % (ref 11.5–15.5)
WBC: 2.1 10*3/uL — AB (ref 4.0–10.5)

## 2013-11-22 LAB — PATHOLOGIST SMEAR REVIEW

## 2013-11-22 LAB — LIPASE, BLOOD: Lipase: 9 U/L — ABNORMAL LOW (ref 11–59)

## 2013-11-22 MED ORDER — CYANOCOBALAMIN 1000 MCG/ML IJ SOLN
1000.0000 ug | Freq: Every day | INTRAMUSCULAR | Status: DC
  Administered 2013-11-22 – 2013-11-25 (×4): 1000 ug via SUBCUTANEOUS
  Filled 2013-11-22 (×4): qty 1

## 2013-11-22 MED ORDER — DIVALPROEX SODIUM ER 500 MG PO TB24
500.0000 mg | ORAL_TABLET | Freq: Every day | ORAL | Status: DC
  Administered 2013-11-22 – 2013-11-25 (×4): 500 mg via ORAL
  Filled 2013-11-22 (×4): qty 1

## 2013-11-22 MED ORDER — LOPERAMIDE HCL 2 MG PO CAPS
2.0000 mg | ORAL_CAPSULE | ORAL | Status: DC | PRN
  Administered 2013-11-23 – 2013-11-24 (×3): 2 mg via ORAL
  Filled 2013-11-22 (×3): qty 1

## 2013-11-22 NOTE — Progress Notes (Signed)
PATIENT DETAILS Name: Shawn [name] Age: 63 y.o. Sex: male Date of Birth: [birth year] Admit Date: 11/20/2013 Admitting Physician Donne Hazel, MD MWN:[id number] H, MD  Subjective: No vomiting for 48 hours,  Diarrhea seems to be slowing down  Assessment/Plan: Active Problems: Nausea-vomiting with diarrhea - Workup underway, given leukopenia-suspect a viral syndrome -no further vomiting, per patient diarrhea seems to be slowing down. Stool C. difficile PCR neg. - Continue with supportive care, await stool cultures and stool GI pathogen panel. Start as needed antidiarrheal agents  Dehydration - Clinically euvolemic, continue with IV fluids given ongoing diarrhea  Leukopenia -? Secondary to viral syndrome-which would explain nausea vomiting and diarrhea, WBC better today. -  HIV negative, vitamin B12 levels are low-we'll begin supplementation  Mild thrombocytopenia - Could be secondary to viral syndrome-continue to monitor.  Cholelithiasis - Suspect not responsible for any of the patient's presenting symptoms. No abdominal pain. Patient can pursue elective cholecystectomy on discharge.  Vitamin B12 deficiency - Start vitamin B12 subcutaneous injections-further workup to be done in the outpatient setting  Alcohol use - Lasting approximately a week ago. Very minimally tremulous - As needed Ativan, start folate, MVI and thiamine  Bipolar disorder - Currently without any suicidal or homicidal ideation. - Continue Depakote  Hip pain - X-ray of the bilateral hip done on 3/15 while in the emergency room negative for fractures. - To provide supportive care. Able to ambulate and weight on the leg-patient able to walk to the bathroom.  Nondisplaced left anterior lateral ninth rib fracture - Supportive care.  Disposition: Remain inpatient  DVT Prophylaxis: Prophylactic Heparin-watch platelets  Code Status: Full code   Family Communication None at  bedside  Procedures:  None  CONSULTS:  None  MEDICATIONS: Scheduled Meds: . cyanocobalamin  1,000 mcg Subcutaneous Daily  . folic acid  1 mg Oral Daily  . heparin  5,000 Units Subcutaneous 3 times per day  . multivitamin with minerals  1 tablet Oral Daily  . saccharomyces boulardii  250 mg Oral BID  . thiamine  100 mg Oral Daily   Or  . thiamine  100 mg Intravenous Daily  . valproate sodium  500 mg Intravenous Q12H   Continuous Infusions: . 0.9 % NaCl with KCl 20 mEq / L 100 mL/hr at 11/22/13 0330   PRN Meds:.acetaminophen, acetaminophen, morphine injection, ondansetron (ZOFRAN) IV, ondansetron  Antibiotics: Anti-infectives   None       PHYSICAL EXAM: Vital signs in last 24 hours: Filed Vitals:   11/21/13 2215 11/22/13 0555 11/22/13 0603 11/22/13 0900  BP: 137/85 125/77 176/76 125/74  Pulse: 57 51 101 50  Temp: 98.6 F (37 C) 98 F (36.7 C) 98.2 F (36.8 C) 99.7 F (37.6 C)  TempSrc: Oral Oral Oral Oral  Resp: 18 18 20 15   Height:      Weight:      SpO2: 97% 96% 95% 96%    Weight change:  Filed Weights   11/20/13 1913  Weight: 71.8 kg (158 lb 4.6 oz)   Body mass index is 20.89 kg/(m^2).   Gen Exam: Awake and alert with clear speech.   Neck: Supple, No JVD.   Chest: B/L Clear.   CVS: S1 S2 Regular, no murmurs.  Abdomen: soft, BS +, non tender, non distended.  Extremities: no edema, lower extremities warm to touch Neurologic: Non Focal.   Skin: No Rash.  Wounds: N/A.    Intake/Output from previous day:  Intake/Output  Summary (Last 24 hours) at 11/22/13 1135 Last data filed at 11/22/13 0835  Gross per 24 hour  Intake   2410 ml  Output      0 ml  Net   2410 ml     LAB RESULTS: CBC  Recent Labs Lab 11/16/13 1810 11/20/13 1844 11/21/13 0523 11/21/13 1109 11/22/13 0453  WBC 4.4 1.6* 1.4* 1.5* 2.1*  HGB 17.3* 15.2 14.2 14.5 13.3  HCT 48.7 45.7 40.8 41.7 38.9*  PLT 196 142* 139* 149* 133*  MCV 95.3 97.0 95.3 96.1 97.0  MCH 33.9 32.3  33.2 33.4 33.2  MCHC 35.5 33.3 34.8 34.8 34.2  RDW 13.6 13.4 13.4 13.2 13.2  LYMPHSABS 0.6* 0.1*  --  0.3*  --   MONOABS 0.4 0.2  --  0.2  --   EOSABS 0.1 0.0  --  0.0  --   BASOSABS 0.0 0.0  --  0.0  --     Chemistries   Recent Labs Lab 11/16/13 1810 11/20/13 1844 11/21/13 0523 11/22/13 0453  NA 144 142 138 140  K 3.3* 3.6* 2.8* 3.9  CL 100 102 103 107  CO2 27 29 24 26   GLUCOSE 93 104* 92 83  BUN 9 11 9  4*  CREATININE 0.76 0.80 0.70 0.76  CALCIUM 9.5 9.1 8.3* 8.1*  MG  --   --  1.5 1.6    CBG: No results found for this basename: GLUCAP,  in the last 168 hours  GFR Estimated Creatinine Clearance: 93.5 ml/min (by C-G formula based on Cr of 0.76).  Coagulation profile No results found for this basename: INR, PROTIME,  in the last 168 hours  Cardiac Enzymes No results found for this basename: CK, CKMB, TROPONINI, MYOGLOBIN,  in the last 168 hours  No components found with this basename: POCBNP,  No results found for this basename: DDIMER,  in the last 72 hours No results found for this basename: HGBA1C,  in the last 72 hours No results found for this basename: CHOL, HDL, LDLCALC, TRIG, CHOLHDL, LDLDIRECT,  in the last 72 hours No results found for this basename: TSH, T4TOTAL, FREET3, T3FREE, THYROIDAB,  in the last 72 hours  Recent Labs  11/21/13 1109  VITAMINB12 188*    Recent Labs  11/22/13 0453  LIPASE 9*    Urine Studies No results found for this basename: UACOL, UAPR, USPG, UPH, UTP, UGL, UKET, UBIL, UHGB, UNIT, UROB, ULEU, UEPI, UWBC, URBC, UBAC, CAST, CRYS, UCOM, BILUA,  in the last 72 hours  MICROBIOLOGY: Recent Results (from the past 240 hour(s))  CLOSTRIDIUM DIFFICILE BY PCR     Status: None   Collection Time    11/21/13  1:28 PM      Result Value Ref Range Status   C difficile by pcr NEGATIVE  NEGATIVE Final   Comment: Performed at [place] STUDIES/RESULTS: Dg Chest 1 View  11/13/2013   CLINICAL DATA:  Altered  mental status.  Alcohol  EXAM: CHEST - 1 VIEW  COMPARISON:  11/13/2011  FINDINGS: The heart size and mediastinal contours are within normal limits. Both lungs are clear. The visualized skeletal structures are unremarkable.  IMPRESSION: No active disease.   Electronically Signed   By: Franchot Gallo M.D.   On: 11/13/2013 22:19   Dg Ribs Unilateral W/chest Left  11/16/2013   CLINICAL DATA:  Left anterior rib pain  EXAM: LEFT RIBS AND CHEST - 3+ VIEW  COMPARISON:  DG RIBS UNILATERAL W/CHEST*L* dated  11/14/2013  FINDINGS: There is a nondisplaced fracture of the left anterolateral ninth rib. There is no evidence of pneumothorax or pleural effusion. Both lungs are clear. The lungs are hyperinflated likely secondary to COPD. Heart size and mediastinal contours are within normal limits.  IMPRESSION: Nondisplaced fracture of the left anterolateral ninth rib.   Electronically Signed   By: Kathreen Devoid   On: 11/16/2013 18:07   Dg Ribs Unilateral W/chest Left  11/14/2013   CLINICAL DATA:  Chest pain following injury  EXAM: LEFT RIBS AND CHEST - 3+ VIEW  COMPARISON:  None.  FINDINGS: No fracture or other bone lesions are seen involving the ribs. There is no evidence of pneumothorax or pleural effusion. Both lungs are clear. Heart size and mediastinal contours are within normal limits.  IMPRESSION: No acute abnormality noted.   Electronically Signed   By: Inez Catalina M.D.   On: 11/14/2013 08:39   Dg Hip Bilateral W/pelvis  11/16/2013   CLINICAL DATA:  Bilateral hip pain, fall  EXAM: BILATERAL HIP WITH PELVIS - 4+ VIEW  COMPARISON:  11/14/2013  FINDINGS: IM nail with compression screw in proximal left femur post ORIF.  Hardware intact.  Diffuse osseous demineralization.  Mild narrowing of the hip joints bilaterally.  Slight narrowing of left SI joint versus right, unchanged.  No acute fracture, dislocation or bone destruction.  IMPRESSION: Osseous demineralization with mild degenerative changes of the hip joints and  evidence of prior left hip ORIF.  No acute abnormalities.   Electronically Signed   By: Lavonia Dana M.D.   On: 11/16/2013 18:06   Dg Hip Bilateral W/pelvis  11/14/2013   CLINICAL DATA:  Hip pain  EXAM: BILATERAL HIP WITH PELVIS - 4+ VIEW  COMPARISON:  MR HIP*L* W/O CM dated 08/06/2012; DG HIP OPERATIVE*L* dated 08/06/2012  FINDINGS: Dynamic left femoral nail in place. Bones are subjectively osteopenic. No displaced pelvic fracture is identified. Both femoral heads appear properly located. No proximal femoral fracture is identified on either side.  IMPRESSION: No fracture or dislocation identified.   Electronically Signed   By: Conchita Paris M.D.   On: 11/14/2013 08:45   Ct Head Wo Contrast  11/16/2013   CLINICAL DATA:  Multiple falls this week  EXAM: CT HEAD WITHOUT CONTRAST  TECHNIQUE: Contiguous axial images were obtained from the base of the skull through the vertex without intravenous contrast.  COMPARISON:  CT HEAD W/O CM dated 11/14/2013; CT HEAD W/O CM dated 09/15/2013  FINDINGS: No hemorrhage or extra-axial fluid. No skull fracture. Moderate diffuse atrophy an low attenuation in the deep white matter. Tiny lacunar infarct right frontoparietal region above the level of the ventricles image number 21, measuring less than a cm, stable. No vascular territory infarct.  IMPRESSION: Chronic involutional change.  No acute findings.   Electronically Signed   By: Skipper Cliche M.D.   On: 11/16/2013 18:10   Ct Head Wo Contrast  11/14/2013   CLINICAL DATA:  Headache, fall  EXAM: CT HEAD WITHOUT CONTRAST  TECHNIQUE: Contiguous axial images were obtained from the base of the skull through the vertex without intravenous contrast.  COMPARISON:  CT HEAD W/O CM dated 09/15/2013  FINDINGS: Mild cortical volume loss noted with proportional ventricular prominence. Areas of periventricular white matter hypodensity are most compatible with small vessel ischemic change. No acute hemorrhage, infarct, or mass lesion is  identified. No midline shift. Orbits are grossly unremarkable. Mild ethmoid mucoperiosteal thickening noted. No skull fracture.  IMPRESSION: No acute intracranial finding.  Remote findings as above.   Electronically Signed   By: Conchita Paris M.D.   On: 11/14/2013 08:27   Ct Abdomen Pelvis W Contrast  11/21/2013   CLINICAL DATA:  Diarrhea  EXAM: CT ABDOMEN AND PELVIS WITH CONTRAST  TECHNIQUE: Multidetector CT imaging of the abdomen and pelvis was performed using the standard protocol following bolus administration of intravenous contrast.  CONTRAST:  6mL OMNIPAQUE IOHEXOL 300 MG/ML  SOLN  COMPARISON:  None.  FINDINGS: Deep and atelectasis at the lung bases.  Diffuse hepatic steatosis.  The gallbladder is decompressed. Heterogeneous densities in the fundus or worrisome for stones.  Spleen, pancreas, adrenal glands are within normal limits.  There simple cyst in the kidneys. Focal scarring in the interpolar region and lower pole of the left kidney. Some hypodensities in the kidneys are too small to characterize.  No focal bowel wall thickening of the colon. No disproportionate dilatation of small bowel. Lower abdominal hernia is noted with a very wide neck. The hernia sac contains primarily small bowel loops.  No free-fluid.  No abnormal retroperitoneal adenopathy.  No acute bony deformity. Moderate degenerative change in the lumbar spine. Multifactorial spinal stenosis is evident and L4-5.  Mild atherosclerotic changes of the visceral vasculature. SMA is patent. Celiac and IMA are grossly patent.  IMPRESSION: Findings are worrisome for cholelithiasis. Ultrasound can be performed to further characterize.  Lower abdominal hernia containing small bowel loops without evidence of small bowel obstruction.   Electronically Signed   By: Maryclare Bean M.D.   On: 11/21/2013 08:10    Oren Binet, MD  [facility] Pager:[phone]  If 7PM-7AM, please contact night-coverage [email or web address] Password  [facility] Fall 11/22/2013, 11:35 AM   LOS: 2 days

## 2013-11-22 NOTE — Progress Notes (Signed)
Clinical Social Work Department BRIEF PSYCHOSOCIAL ASSESSMENT 11/22/2013  Patient:  Shawn [name], Shawn [name]     Account Number:  [id number]     Admit date:  11/20/2013  Clinical Social Worker:  Earlie Server  Date/Time:  11/22/2013 03:00 PM  Referred by:  Physician  Date Referred:  11/22/2013 Referred for  SNF Placement   Other Referral:   Interview type:  Patient Other interview type:    PSYCHOSOCIAL DATA Living Status:  FAMILY Admitted from facility:   Level of care:   Primary support name:  Shawn [name] Primary support relationship to patient:  FRIEND Degree of support available:   Lacking    CURRENT CONCERNS Current Concerns  Post-Acute Placement   Other Concerns:    SOCIAL WORK ASSESSMENT / PLAN CSW received referral in order to complete psychosocial assessment. CSW reviewed chart which stated that patient was at [facility] but admitted to [facility] for medical concerns. CSW spoke with [facility] who reports that patient was going to be DC to SNF. Patient was agreeable to SNF but process had not been started. CSW met with patient at bedside. CSW introduced myself and explained role.    Patient reports that due to depression and alcohol abuse that he was admitted to [facility]. Patient reports that he has been sober off and on throughout his life but felt he needed medical treatment. 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 Patient reports that he is inconsistent with following up on outpatient basis for MH concerns but is very active with AA meetings. Patient reports he attends 4-5 AA meetings a week and feels that he can manage his substance use. Patient reports until he is able to feel physically better that he cannot focus on MH or SA. Patient denies any SI or HI and reports that if he can ambulate better then he can become more involved with AA.    CSW  explained SNF process and explained Medicare coverage. Patient reports he broke his hip last year and went to [facility] for rehab. Patient states this was a good experience and reports that he is agreeable to return to SNF for rehab.    CSW completed FL2 and submitted clinicals to [facility] for 30 day pasarr. CSW will follow up with bed offers.   Assessment/plan status:  Psychosocial Support/Ongoing Assessment of Needs Other assessment/ plan:   Information/referral to community resources:   SNF list    PATIENT'S/FAMILY'S RESPONSE TO PLAN OF CARE: Patient alert and oriented. Patient reports that he had a good experience at [facility] and feels more stable on medications. Patient has not drank any alcohol since last Sunday and reports no current cravings. Patient is agreeable to SNF and reports that once he feels better then he will start attending [place] meetings again. Patient agreeable to CSW follow up.       [place], [place] [phone]

## 2013-11-22 NOTE — Progress Notes (Addendum)
Patient asking for his wallet that he had locked up at [facility].  Clothes given to patient that he wore in from [facility] to the ED but no wallet with clothes.  Phoned [facility] spoke with [facility] regarding checking the safe for presence of wallet

## 2013-11-22 NOTE — Progress Notes (Addendum)
Clinical Social Work Department CLINICAL SOCIAL WORK PLACEMENT NOTE 11/22/2013  Patient:  Shawn [name], Shawn [name]  Account Number:  [id number] Admit date:  11/20/2013  Clinical Social Worker:  Sindy Messing, LCSW  Date/time:  11/22/2013 03:30 PM  Clinical Social Work is seeking post-discharge placement for this patient at the following level of care:   SKILLED NURSING   (*CSW will update this form in Epic as items are completed)   11/22/2013  Patient/family provided with [place] Department of Clinical Social Work's list of facilities offering this level of care within the geographic area requested by the patient (or if unable, by the patient's family).  11/22/2013  Patient/family informed of their freedom to choose among providers that offer the needed level of care, that participate in Medicare, Medicaid or managed care program needed by the patient, have an available bed and are willing to accept the patient.  11/22/2013  Patient/family informed of [facility]' ownership interest in [facility], as well as of the fact that they are under no obligation to receive care at this facility.  PASARR submitted to [facility] on 11/22/2013 PASARR number received from [facility] on 11/23/13  FL2 transmitted to all facilities in geographic area requested by pt/family on  11/22/2013 FL2 transmitted to all facilities within larger geographic area on   Patient informed that his/her managed care company has contracts with or will negotiate with  certain facilities, including the following:     Patient/family informed of bed offers received:  11/23/13 Patient chooses bed at [facility] Physician recommends and patient chooses bed at    Patient to be transferred [place]  on 11/25/13  Patient to be transferred to facility by [facility]  The following physician request were entered in Epic:   Additional Comments:

## 2013-11-23 LAB — FECAL LACTOFERRIN, QUANT: Fecal Lactoferrin: POSITIVE

## 2013-11-23 LAB — CBC
HCT: 36.9 % — ABNORMAL LOW (ref 39.0–52.0)
Hemoglobin: 13.1 g/dL (ref 13.0–17.0)
MCH: 33.3 pg (ref 26.0–34.0)
MCHC: 35.5 g/dL (ref 30.0–36.0)
MCV: 93.9 fL (ref 78.0–100.0)
PLATELETS: 160 10*3/uL (ref 150–400)
RBC: 3.93 MIL/uL — ABNORMAL LOW (ref 4.22–5.81)
RDW: 12.8 % (ref 11.5–15.5)
WBC: 3.1 10*3/uL — ABNORMAL LOW (ref 4.0–10.5)

## 2013-11-23 LAB — BASIC METABOLIC PANEL
BUN: 3 mg/dL — ABNORMAL LOW (ref 6–23)
CALCIUM: 8.2 mg/dL — AB (ref 8.4–10.5)
CO2: 26 mEq/L (ref 19–32)
Chloride: 105 mEq/L (ref 96–112)
Creatinine, Ser: 0.71 mg/dL (ref 0.50–1.35)
GFR calc non Af Amer: >90 mL/min (ref >90)
Glucose, Bld: 86 mg/dL (ref 70–99)
Potassium: 3.4 mEq/L — ABNORMAL LOW (ref 3.7–5.3)
SODIUM: 141 meq/L (ref 137–147)

## 2013-11-23 MED ORDER — POTASSIUM CHLORIDE CRYS ER 20 MEQ PO TBCR
40.0000 meq | EXTENDED_RELEASE_TABLET | Freq: Once | ORAL | Status: AC
  Administered 2013-11-23: 40 meq via ORAL
  Filled 2013-11-23: qty 2

## 2013-11-23 NOTE — Progress Notes (Signed)
Clinical Social Work  Sports administrator has been received. Patient's only bed offer is at [facility] which CSW explained to patient. Per MD and RN, patient has tested positive for norovirus. CSW called SNF in order to determine their criteria to accept patient with positive norovirus. CSW left a message with admissions and will await a return call. CSW will update MD on plans as well.  [place], [place] [phone]

## 2013-11-23 NOTE — Progress Notes (Signed)
PATIENT DETAILS Name: Shawn [name] Age: 63 y.o. Sex: male Date of Birth: [birth year] Admit Date: 11/20/2013 Admitting Physician Donne Hazel, MD EKC:[id number],HXTAVW H, MD  Subjective: No vomiting,  Diarrhea slowly improving  Assessment/Plan: Active Problems: Nausea-vomiting with diarrhea -slowly improving, no further vomiting. Diarrhea has slowed down, but continues nonetheless -Stool C. difficile PCR neg,stool GI pathogen panel positive for Norvovirus - Continue with supportive care. Start as needed antidiarrheal agents  Dehydration - Clinically euvolemic, continue with IV fluids given ongoing diarrhea  Leukopenia -? Secondary to viral syndrome-which would explain nausea vomiting and diarrhea, WBC better today. -  HIV negative, vitamin B12 levels are low-we'll begin supplementation, follow up Anti IF antibody  Mild thrombocytopenia - secondary to viral syndrome-resolved-continue to monitor.  Cholelithiasis - Suspect not responsible for any of the patient's presenting symptoms. No abdominal pain. Patient can pursue elective cholecystectomy on discharge.  Vitamin B12 deficiency - Start vitamin B12 subcutaneous injections-further workup to be done in the outpatient setting  Alcohol use - Lasting approximately a week ago. Very minimally tremulous - As needed Ativan, start folate, MVI and thiamine  Bipolar disorder - Currently without any suicidal or homicidal ideation. - Continue Depakote  Hip pain - X-ray of the bilateral hip done on 3/15 while in the emergency room negative for fractures. - To provide supportive care. Able to ambulate and weight on the leg-patient able to walk to the bathroom.  Nondisplaced left anterior lateral ninth rib fracture - Supportive care.  Disposition: Remain inpatient-SNF when diarrhea resolves  DVT Prophylaxis: Prophylactic Heparin-watch platelets  Code Status: Full code   Family Communication None at  bedside  Procedures:  None  CONSULTS:  None  MEDICATIONS: Scheduled Meds: . cyanocobalamin  1,000 mcg Subcutaneous Daily  . divalproex  500 mg Oral Daily  . folic acid  1 mg Oral Daily  . heparin  5,000 Units Subcutaneous 3 times per day  . multivitamin with minerals  1 tablet Oral Daily  . saccharomyces boulardii  250 mg Oral BID  . thiamine  100 mg Oral Daily   Or  . thiamine  100 mg Intravenous Daily   Continuous Infusions: . 0.9 % NaCl with KCl 20 mEq / L 75 mL (11/23/13 1302)   PRN Meds:.acetaminophen, acetaminophen, loperamide, morphine injection, ondansetron (ZOFRAN) IV, ondansetron  Antibiotics: Anti-infectives   None       PHYSICAL EXAM: Vital signs in last 24 hours: Filed Vitals:   11/22/13 1458 11/22/13 2113 11/23/13 0543 11/23/13 1400  BP: 135/78 133/79 130/68 124/76  Pulse: 51 58 61 51  Temp: 99.1 F (37.3 C) 97.8 F (36.6 C) 98.1 F (36.7 C) 98 F (36.7 C)  TempSrc: Oral   Oral  Resp: 18 20 18 20   Height:      Weight:      SpO2: 97% 97% 97% 100%    Weight change:  Filed Weights   11/20/13 1913  Weight: 71.8 kg (158 lb 4.6 oz)   Body mass index is 20.89 kg/(m^2).   Gen Exam: Awake and alert with clear speech.   Neck: Supple, No JVD.   Chest: B/L Clear.   CVS: S1 S2 Regular, no murmurs.  Abdomen: soft, BS +, non tender, non distended.  Extremities: no edema, lower extremities warm to touch Neurologic: Non Focal.   Skin: No Rash.  Wounds: N/A.    Intake/Output from previous day:  Intake/Output Summary (Last 24 hours) at 11/23/13 1432 Last data filed  at 11/23/13 1032  Gross per 24 hour  Intake    840 ml  Output    275 ml  Net    565 ml     LAB RESULTS: CBC  Recent Labs Lab 11/16/13 1810 11/20/13 1844 11/21/13 0523 11/21/13 1109 11/22/13 0453 11/23/13 0430  WBC 4.4 1.6* 1.4* 1.5* 2.1* 3.1*  HGB 17.3* 15.2 14.2 14.5 13.3 13.1  HCT 48.7 45.7 40.8 41.7 38.9* 36.9*  PLT 196 142* 139* 149* 133* 160  MCV 95.3 97.0 95.3  96.1 97.0 93.9  MCH 33.9 32.3 33.2 33.4 33.2 33.3  MCHC 35.5 33.3 34.8 34.8 34.2 35.5  RDW 13.6 13.4 13.4 13.2 13.2 12.8  LYMPHSABS 0.6* 0.1*  --  0.3*  --   --   MONOABS 0.4 0.2  --  0.2  --   --   EOSABS 0.1 0.0  --  0.0  --   --   BASOSABS 0.0 0.0  --  0.0  --   --     Chemistries   Recent Labs Lab 11/16/13 1810 11/20/13 1844 11/21/13 0523 11/22/13 0453 11/23/13 0430  NA 144 142 138 140 141  K 3.3* 3.6* 2.8* 3.9 3.4*  CL 100 102 103 107 105  CO2 27 29 24 26 26   GLUCOSE 93 104* 92 83 86  BUN 9 11 9  4* 3*  CREATININE 0.76 0.80 0.70 0.76 0.71  CALCIUM 9.5 9.1 8.3* 8.1* 8.2*  MG  --   --  1.5 1.6  --     CBG: No results found for this basename: GLUCAP,  in the last 168 hours  GFR Estimated Creatinine Clearance: 93.5 ml/min (by C-G formula based on Cr of 0.71).  Coagulation profile No results found for this basename: INR, PROTIME,  in the last 168 hours  Cardiac Enzymes No results found for this basename: CK, CKMB, TROPONINI, MYOGLOBIN,  in the last 168 hours  No components found with this basename: POCBNP,  No results found for this basename: DDIMER,  in the last 72 hours No results found for this basename: HGBA1C,  in the last 72 hours No results found for this basename: CHOL, HDL, LDLCALC, TRIG, CHOLHDL, LDLDIRECT,  in the last 72 hours No results found for this basename: TSH, T4TOTAL, FREET3, T3FREE, THYROIDAB,  in the last 72 hours  Recent Labs  11/21/13 1109  VITAMINB12 188*    Recent Labs  11/22/13 0453  LIPASE 9*    Urine Studies No results found for this basename: UACOL, UAPR, USPG, UPH, UTP, UGL, UKET, UBIL, UHGB, UNIT, UROB, ULEU, UEPI, UWBC, URBC, UBAC, CAST, CRYS, UCOM, BILUA,  in the last 72 hours  MICROBIOLOGY: Recent Results (from the past 240 hour(s))  STOOL CULTURE     Status: None   Collection Time    11/21/13  1:28 PM      Result Value Ref Range Status   Specimen Description STOOL   Final   Special Requests NONE   Final    Culture     Final   Value: NO SUSPICIOUS COLONIES, CONTINUING TO HOLD     Performed at [facility]   Report Status PENDING   Incomplete  CLOSTRIDIUM DIFFICILE BY PCR     Status: None   Collection Time    11/21/13  1:28 PM      Result Value Ref Range Status   C difficile by pcr NEGATIVE  NEGATIVE Final   Comment: Performed at [facility]  STUDIES/RESULTS: Dg Chest 1 View  11/13/2013   CLINICAL DATA:  Altered mental status.  Alcohol  EXAM: CHEST - 1 VIEW  COMPARISON:  11/13/2011  FINDINGS: The heart size and mediastinal contours are within normal limits. Both lungs are clear. The visualized skeletal structures are unremarkable.  IMPRESSION: No active disease.   Electronically Signed   By: Marlan Palau M.D.   On: 11/13/2013 22:19   Dg Ribs Unilateral W/chest Left  11/16/2013   CLINICAL DATA:  Left anterior rib pain  EXAM: LEFT RIBS AND CHEST - 3+ VIEW  COMPARISON:  DG RIBS UNILATERAL W/CHEST*L* dated 11/14/2013  FINDINGS: There is a nondisplaced fracture of the left anterolateral ninth rib. There is no evidence of pneumothorax or pleural effusion. Both lungs are clear. The lungs are hyperinflated likely secondary to COPD. Heart size and mediastinal contours are within normal limits.  IMPRESSION: Nondisplaced fracture of the left anterolateral ninth rib.   Electronically Signed   By: Elige Ko   On: 11/16/2013 18:07   Dg Ribs Unilateral W/chest Left  11/14/2013   CLINICAL DATA:  Chest pain following injury  EXAM: LEFT RIBS AND CHEST - 3+ VIEW  COMPARISON:  None.  FINDINGS: No fracture or other bone lesions are seen involving the ribs. There is no evidence of pneumothorax or pleural effusion. Both lungs are clear. Heart size and mediastinal contours are within normal limits.  IMPRESSION: No acute abnormality noted.   Electronically Signed   By: Alcide Clever M.D.   On: 11/14/2013 08:39   Dg Hip Bilateral W/pelvis  11/16/2013   CLINICAL DATA:  Bilateral hip pain, fall   EXAM: BILATERAL HIP WITH PELVIS - 4+ VIEW  COMPARISON:  11/14/2013  FINDINGS: IM nail with compression screw in proximal left femur post ORIF.  Hardware intact.  Diffuse osseous demineralization.  Mild narrowing of the hip joints bilaterally.  Slight narrowing of left SI joint versus right, unchanged.  No acute fracture, dislocation or bone destruction.  IMPRESSION: Osseous demineralization with mild degenerative changes of the hip joints and evidence of prior left hip ORIF.  No acute abnormalities.   Electronically Signed   By: Ulyses Southward M.D.   On: 11/16/2013 18:06   Dg Hip Bilateral W/pelvis  11/14/2013   CLINICAL DATA:  Hip pain  EXAM: BILATERAL HIP WITH PELVIS - 4+ VIEW  COMPARISON:  MR HIP*L* W/O CM dated 08/06/2012; DG HIP OPERATIVE*L* dated 08/06/2012  FINDINGS: Dynamic left femoral nail in place. Bones are subjectively osteopenic. No displaced pelvic fracture is identified. Both femoral heads appear properly located. No proximal femoral fracture is identified on either side.  IMPRESSION: No fracture or dislocation identified.   Electronically Signed   By: Christiana Pellant M.D.   On: 11/14/2013 08:45   Ct Head Wo Contrast  11/16/2013   CLINICAL DATA:  Multiple falls this week  EXAM: CT HEAD WITHOUT CONTRAST  TECHNIQUE: Contiguous axial images were obtained from the base of the skull through the vertex without intravenous contrast.  COMPARISON:  CT HEAD W/O CM dated 11/14/2013; CT HEAD W/O CM dated 09/15/2013  FINDINGS: No hemorrhage or extra-axial fluid. No skull fracture. Moderate diffuse atrophy an low attenuation in the deep white matter. Tiny lacunar infarct right frontoparietal region above the level of the ventricles image number 21, measuring less than a cm, stable. No vascular territory infarct.  IMPRESSION: Chronic involutional change.  No acute findings.   Electronically Signed   By: Esperanza Heir M.D.   On: 11/16/2013 18:10  Ct Head Wo Contrast  11/14/2013   CLINICAL DATA:  Headache, fall   EXAM: CT HEAD WITHOUT CONTRAST  TECHNIQUE: Contiguous axial images were obtained from the base of the skull through the vertex without intravenous contrast.  COMPARISON:  CT HEAD W/O CM dated 09/15/2013  FINDINGS: Mild cortical volume loss noted with proportional ventricular prominence. Areas of periventricular white matter hypodensity are most compatible with small vessel ischemic change. No acute hemorrhage, infarct, or mass lesion is identified. No midline shift. Orbits are grossly unremarkable. Mild ethmoid mucoperiosteal thickening noted. No skull fracture.  IMPRESSION: No acute intracranial finding.  Remote findings as above.   Electronically Signed   By: Conchita Paris M.D.   On: 11/14/2013 08:27   Ct Abdomen Pelvis W Contrast  11/21/2013   CLINICAL DATA:  Diarrhea  EXAM: CT ABDOMEN AND PELVIS WITH CONTRAST  TECHNIQUE: Multidetector CT imaging of the abdomen and pelvis was performed using the standard protocol following bolus administration of intravenous contrast.  CONTRAST:  28mL OMNIPAQUE IOHEXOL 300 MG/ML  SOLN  COMPARISON:  None.  FINDINGS: Deep and atelectasis at the lung bases.  Diffuse hepatic steatosis.  The gallbladder is decompressed. Heterogeneous densities in the fundus or worrisome for stones.  Spleen, pancreas, adrenal glands are within normal limits.  There simple cyst in the kidneys. Focal scarring in the interpolar region and lower pole of the left kidney. Some hypodensities in the kidneys are too small to characterize.  No focal bowel wall thickening of the colon. No disproportionate dilatation of small bowel. Lower abdominal hernia is noted with a very wide neck. The hernia sac contains primarily small bowel loops.  No free-fluid.  No abnormal retroperitoneal adenopathy.  No acute bony deformity. Moderate degenerative change in the lumbar spine. Multifactorial spinal stenosis is evident and L4-5.  Mild atherosclerotic changes of the visceral vasculature. SMA is patent. Celiac and IMA  are grossly patent.  IMPRESSION: Findings are worrisome for cholelithiasis. Ultrasound can be performed to further characterize.  Lower abdominal hernia containing small bowel loops without evidence of small bowel obstruction.   Electronically Signed   By: Maryclare Bean M.D.   On: 11/21/2013 08:10    Oren Binet, MD  [facility] Pager:[phone]  If 7PM-7AM, please contact night-coverage [email or web address] Password [facility] 11/23/2013, 2:32 PM   LOS: 3 days

## 2013-11-23 NOTE — Progress Notes (Signed)
Clinical Social Work  [facility] agreeable to accept patient on isolation precautions as long as no active diarrhea or vomiting. CSW informed MD of information and will continue to follow to assist with DC when medically stable.  [place], [place] [phone]

## 2013-11-23 NOTE — Progress Notes (Signed)
Notified Dr. Sloan Leiter of positive norovirus results

## 2013-11-24 DIAGNOSIS — F329 Major depressive disorder, single episode, unspecified: Secondary | ICD-10-CM

## 2013-11-24 DIAGNOSIS — F3289 Other specified depressive episodes: Secondary | ICD-10-CM

## 2013-11-24 DIAGNOSIS — F3160 Bipolar disorder, current episode mixed, unspecified: Secondary | ICD-10-CM

## 2013-11-24 LAB — CBC
HCT: 37.8 % — ABNORMAL LOW (ref 39.0–52.0)
Hemoglobin: 13 g/dL (ref 13.0–17.0)
MCH: 32.5 pg (ref 26.0–34.0)
MCHC: 34.4 g/dL (ref 30.0–36.0)
MCV: 94.5 fL (ref 78.0–100.0)
PLATELETS: 170 10*3/uL (ref 150–400)
RBC: 4 MIL/uL — ABNORMAL LOW (ref 4.22–5.81)
RDW: 13 % (ref 11.5–15.5)
WBC: 2.8 10*3/uL — ABNORMAL LOW (ref 4.0–10.5)

## 2013-11-24 LAB — INTRINSIC FACTOR ANTIBODIES: INTRINSIC FACTOR: POSITIVE

## 2013-11-24 MED ORDER — FLUOXETINE HCL 10 MG PO CAPS
10.0000 mg | ORAL_CAPSULE | Freq: Every day | ORAL | Status: DC
  Administered 2013-11-24 – 2013-11-25 (×2): 10 mg via ORAL
  Filled 2013-11-24 (×2): qty 1

## 2013-11-24 NOTE — Progress Notes (Signed)
[facility] PROGRESS NOTE  Shawn [name] XBM:[id number] DOB: 10-Sep-1950 DOA: 11/20/2013 PCP: Shawn Sou, MD   Brief narrative 63 year old male with history of bipolar disorder, depression, alcohol abuse who was initially admitted to [facility] services with suicidal ideations. After 2 days of hospitalization he began having sudden onset of heavy diarrhea associated nausea and vomiting with generalized abdominal pain. Patient admitted to hospitalists service for further workup and hydration  Assessment/Plan: Nausea/ vomiting with diarrhea  -No further nausea or vomiting. Had one episode of formed stool this morning. -Stool C. difficile PCR neg,stool GI pathogen panel positive for Norvovirus  - Continue with supportive care. Started on as needed  antidiarrheal agents   Dehydration  Improving with IV hydration. Hypokalemia resolved  Leukopenia  -? Secondary to viral syndrome. Unlikely to chronic alcohol use as recent CBC have been normal - HIV negative. Low B12  Mild thrombocytopenia  - secondary to viral syndrome. Now resolved  Cholelithiasis  - Suspect not responsible for any of the patient's presenting symptoms. No abdominal pain.  Followup as outpatient  Vitamin B12 deficiency  Started on sq cyanocobalamin as levels low. Weakness and frequent falls could be explained because of this. follow up Anti IF antibody . Patient will be discharged on 100 mcg cyanocobalamin inj sq daily for next 7 days , then once every 3 days for 3 weeks and check level again. If level improved to normal , needs monthly sq b12 injections.   Alcohol use  Last intake one week prior to admission.  no signs of withdrawal  - As needed Ativan, start folate, MVI and thiamine   Bipolar disorder  - Continue Depakote   Chronic depression Patient reports feeling depressed regarding his home situation and weakness. Denies any suicidal ideation. I will add a low-dose Prozac and monitor.  Hip pain  -  X-ray of the bilateral hip done on 3/15 while in the emergency room negative for fractures.  - Patient ambulatory but reports weakness.  Nondisplaced left anterior lateral ninth rib fracture  - Supportive care.    DVT Prophylaxis:  Subcutaneous heparin  Diet: Regular   Code Status: Full code Family Communication: None at bedside Disposition Plan: Skilled nursing facility tomorrow if continues to improve   Consultants:  None  Procedures:  None  Antibiotics:  None  HPI/Subjective: Patient seen and examined this morning. Feels weak on ambulation. Also reports feeling depressed about his home situation. Denies any suicidal ideation.  Objective: Filed Vitals:   11/24/13 0547  BP: 144/77  Pulse: 47  Temp: 98.2 F (36.8 C)  Resp: 20    Intake/Output Summary (Last 24 hours) at 11/24/13 1309 Last data filed at 11/24/13 0854  Gross per 24 hour  Intake    480 ml  Output      0 ml  Net    480 ml   Filed Weights   11/20/13 1913  Weight: 71.8 kg (158 lb 4.6 oz)    Exam:   General:  Elderly male lying in bed in no acute distress  HEENT: No pallor, moist oral mucosa  Cardiovascular: Normal S1 and S2, no murmur rub or gallop  Respiratory: Clear to auscultation bilaterally, no added sounds  Abdomen: Soft, nontender, nondistended, bowel sounds present  Musculoskeletal: Warm, no edema  CNS: AAO x3, flat effect  Data Reviewed: Basic Metabolic Panel:  Recent Labs Lab 11/20/13 1844 11/21/13 0523 11/22/13 0453 11/23/13 0430  NA 142 138 140 141  K 3.6* 2.8* 3.9 3.4*  CL 102  103 107 105  CO2 29 24 26 26   GLUCOSE 104* 92 83 86  BUN 11 9 4* 3*  CREATININE 0.80 0.70 0.76 0.71  CALCIUM 9.1 8.3* 8.1* 8.2*  MG  --  1.5 1.6  --    Liver Function Tests:  Recent Labs Lab 11/20/13 1844 11/22/13 0453  AST 48* 23  ALT 36 21  ALKPHOS 80 61  BILITOT 0.6 0.3  PROT 6.5 4.9*  ALBUMIN 3.4* 2.5*    Recent Labs Lab 11/22/13 0453  LIPASE 9*   No results  found for this basename: AMMONIA,  in the last 168 hours CBC:  Recent Labs Lab 11/20/13 1844 11/21/13 0523 11/21/13 1109 11/22/13 0453 11/23/13 0430 11/24/13 0433  WBC 1.6* 1.4* 1.5* 2.1* 3.1* 2.8*  NEUTROABS 1.3*  --  1.0*  --   --   --   HGB 15.2 14.2 14.5 13.3 13.1 13.0  HCT 45.7 40.8 41.7 38.9* 36.9* 37.8*  MCV 97.0 95.3 96.1 97.0 93.9 94.5  PLT 142* 139* 149* 133* 160 170   Cardiac Enzymes: No results found for this basename: CKTOTAL, CKMB, CKMBINDEX, TROPONINI,  in the last 168 hours BNP (last 3 results) No results found for this basename: PROBNP,  in the last 8760 hours CBG: No results found for this basename: GLUCAP,  in the last 168 hours  Recent Results (from the past 240 hour(s))  STOOL CULTURE     Status: None   Collection Time    11/21/13  1:28 PM      Result Value Ref Range Status   Specimen Description STOOL   Final   Special Requests NONE   Final   Culture     Final   Value: NO SUSPICIOUS COLONIES, CONTINUING TO HOLD     Performed at [facility]   Report Status PENDING   Incomplete  CLOSTRIDIUM DIFFICILE BY PCR     Status: None   Collection Time    11/21/13  1:28 PM      Result Value Ref Range Status   C difficile by pcr NEGATIVE  NEGATIVE Final   Comment: Performed at [facility]     Studies: No results found.  Scheduled Meds: . cyanocobalamin  1,000 mcg Subcutaneous Daily  . divalproex  500 mg Oral Daily  . FLUoxetine  10 mg Oral Daily  . folic acid  1 mg Oral Daily  . heparin  5,000 Units Subcutaneous 3 times per day  . multivitamin with minerals  1 tablet Oral Daily  . saccharomyces boulardii  250 mg Oral BID  . thiamine  100 mg Oral Daily   Or  . thiamine  100 mg Intravenous Daily   Continuous Infusions: . 0.9 % NaCl with KCl 20 mEq / L 75 mL/hr at 11/24/13 1114    Active Problems:   Dehydration    Time spent: 25 minutes    Shawn [name], Shawn [name]  [facility] Pager [phone]. If 7PM-7AM, please contact  night-coverage at [email or web address], password [facility] 11/24/2013, 1:09 PM  LOS: 4 days

## 2013-11-24 NOTE — Progress Notes (Signed)
Patient had 1 small formed stool observed by staff member

## 2013-11-24 NOTE — Progress Notes (Signed)
Patient states he is still having liquid stools, approximately 6, night shift sitter said he never had a bowel movement only urinated.  Instructed patient to alert staff when he had BM so that we might look at it.

## 2013-11-24 NOTE — Progress Notes (Signed)
Patient ambulates with steadier gait to bathroom this evening.  He is reminded to call for assistance to bathroom. Only one bowel movement today from 7am - 7pm

## 2013-11-24 NOTE — Progress Notes (Signed)
Clinical Social Work  CSW spoke with patient who reports he has concerns about plans after DC. Patient reports that he has been asked by landlord to move out of house. Patient reports that he has lived at this house for the past 1.5 years but has not enjoyed living there and wants to move anyways. Patient reports he will sell his [place] to his landlord and use that cash to find a new place to live. Patient lives on social security disability check and reports that he can only afford around $400 a month on a new place. Patient states that he will use the bus after he sells his [place]. Patient reports that he plans to ask friends at church to help him move. CSW provided patient with storage unit facilities in [place] and encouraged patient to call in order to find affordable place to store his belongings while he is in SNF. CSW also provided patient with low income housing in order for patient to research other available options. Patient reports he will ask church family to assist with finding a new placement as well.  CSW explained that patient can DC to [facility] when medically stable. MD DC safety sitter today so if patient is medically stable then SNF is agreeable to accept on 11/25/13.   Patient reports he will continue to follow up at [facility] [place] in order to manage bipolar medications. Patient spoke with MD re: his current medications and desired changes.  CSW will continue to follow.  [place], [place] [phone]

## 2013-11-25 DIAGNOSIS — D61818 Other pancytopenia: Secondary | ICD-10-CM

## 2013-11-25 DIAGNOSIS — A0811 Acute gastroenteropathy due to Norwalk agent: Principal | ICD-10-CM

## 2013-11-25 LAB — CBC
HCT: 38.2 % — ABNORMAL LOW (ref 39.0–52.0)
Hemoglobin: 13.1 g/dL (ref 13.0–17.0)
MCH: 32.3 pg (ref 26.0–34.0)
MCHC: 34.3 g/dL (ref 30.0–36.0)
MCV: 94.3 fL (ref 78.0–100.0)
Platelets: 186 10*3/uL (ref 150–400)
RBC: 4.05 MIL/uL — ABNORMAL LOW (ref 4.22–5.81)
RDW: 13 % (ref 11.5–15.5)
WBC: 2.6 10*3/uL — ABNORMAL LOW (ref 4.0–10.5)

## 2013-11-25 LAB — STOOL CULTURE

## 2013-11-25 MED ORDER — FLUOXETINE HCL 10 MG PO CAPS: 10.0000 mg | ORAL_CAPSULE | Freq: Every day | ORAL | Status: DC

## 2013-11-25 MED ORDER — CYANOCOBALAMIN 1000 MCG/ML IJ SOLN: 1000.0000 ug | Freq: Every day | INTRAMUSCULAR | Status: DC

## 2013-11-25 MED ORDER — TRAMADOL HCL 50 MG PO TABS: 100.0000 mg | ORAL_TABLET | Freq: Four times a day (QID) | ORAL | Status: DC | PRN

## 2013-11-25 NOTE — Progress Notes (Signed)
Patient Discharge Instructions:  After Visit Summary (AVS):   Faxed to:  11/25/13 Discharge Summary Note:   Faxed to:  11/25/13 Psychiatric Admission Assessment Note:   Faxed to:  11/25/13 Faxed/Sent to the Next Level Care provider:  11/25/13 Faxed to [facility] [place], 11/25/2013, 3:29 PM

## 2013-11-25 NOTE — Progress Notes (Signed)
Report called to [place] at [facility].  Nurse denies further questions or concerns.  Patient was transferred to facility via [place].  BP 159/88 HR53 upon DC.

## 2013-11-25 NOTE — Discharge Summary (Signed)
Physician Discharge Summary  Shawn [name] YWV:[id number] DOB: Jun 30, 1951 DOA: 11/20/2013  PCP: Tammi Sou, MD  Admit date: 11/20/2013 Discharge date: 11/25/2013  Time spent: 40 minutes  Recommendations for Outpatient Follow-up:  Discharge to skilled nursing facility Patient needs to followup at [facility] in 2 weeks regarding his bipolar medications and depression  - Discharge Diagnoses:  Principal Problem:   Gastroenteritis due to norovirus  Active Problems:   Dehydration   ETOH abuse   Bipolar affective disorder, current episode mixed Vitamin B12 deficiency   Fall   Depression   Other pancytopenia   Discharge Condition: Fair  Diet recommendation: Regular  Filed Weights   11/20/13 1913  Weight: 71.8 kg (158 lb 4.6 oz)    History of present illness:  63 year old male with history of bipolar disorder, depression, alcohol abuse who was initially admitted to [facility] services with suicidal ideations. After 2 days of hospitalization he began having sudden onset of heavy diarrhea associated nausea and vomiting with generalized abdominal pain.  Patient admitted to hospitalists service for further workup and hydration.   Hospital Course:  Nausea/ vomiting with diarrhea  -No further nausea or vomiting. No further diarrhea. -Stool C. difficile PCR neg,stool GI pathogen panel positive for Norvovirus  -Now resolved   Dehydration  Improved with IV hydration. Hypokalemia resolved   Leukopenia  -? Secondary to viral syndrome. Unlikely to chronic alcohol use as recent CBC have been normal  - HIV negative. Low B12   Mild thrombocytopenia  - secondary to viral syndrome. Now resolved   Cholelithiasis  - Suspect not responsible for any of the patient's presenting symptoms. No abdominal pain.  Followup as outpatient   Vitamin B12 deficiency  Started on sq cyanocobalamin as levels low. Weakness and frequent falls could be explained because of this.  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  Patient will be discharged on 100 mcg cyanocobalamin inj sq daily for next 7 days , then once every 3 days for 3 weeks and check level again. If level improved to normal , needs monthly sq b12 injections.   Alcohol use  Last intake one week prior to admission. no signs of withdrawal  Patient counseled on cessation  Bipolar disorder  - Continue Depakote    Chronic depression  Patient was admitted to [facility] hospital recently for suicidal ideation .Patient reports feeling depressed regarding his home situation and weakness. Denies any suicidal ideation. I have started him on low-dose Prozac and he should be monitored as outpatient.   Hip pain  - X-ray of the bilateral hip done on 3/15 while in the emergency room negative for fractures.  - Patient ambulatory but reports weakness.  -Seen by physical therapy and recommended skilled nursing facility.  Nondisplaced left anterior lateral ninth rib fracture  - Supportive care.   Diet: Regular  Code Status: Full code  Family Communication: Shawn [name] at bedside   Disposition Plan: Skilled nursing facility   Consultants:  Shawn [name] Procedures:  Shawn [name] Antibiotics:  Shawn [name]      Discharge Exam: Filed Vitals:   11/25/13 1129  BP: 146/80  Pulse: 48  Temp: 98.2 F (36.8 C)  Resp: 16   General: Elderly male lying in bed in no acute distress  HEENT: No pallor, moist oral mucosa  Cardiovascular: Normal S1 and S2, no murmur rub or gallop  Respiratory: Clear to auscultation bilaterally, no added sounds  Abdomen: Soft, nontender, nondistended, bowel sounds present  Musculoskeletal: Warm, no edema  CNS: AAO x3, flat effect   Discharge Instructions  Medication List    STOP taking these medications       naproxen 375 MG tablet  Commonly known as:  NAPROSYN      TAKE these medications       cyanocobalamin 1000 MCG/ML injection  Commonly known as:  (VITAMIN B-12)  Inject 1 mL (1,000 mcg total) into the skin daily.     divalproex 500  MG 24 hr tablet  Commonly known as:  DEPAKOTE ER  Take 1 tablet (500 mg total) by mouth daily. For stabilization     FLUoxetine 10 MG capsule  Commonly known as:  PROZAC  Take 1 capsule (10 mg total) by mouth daily.     traMADol 50 MG tablet  Commonly known as:  ULTRAM  Take 100 mg by mouth every 6 (six) hours as needed for moderate pain.       Allergies  Allergen Reactions  . Codeine Nausea Only       Follow-up Information   Please follow up. (at SNF)       Follow up with [facility] (AT [place] 2 WEEKS)    Contact information:   [place] [address] [phone]       The results of significant diagnostics from this hospitalization (including imaging, microbiology, ancillary and laboratory) are listed below for reference.    Significant Diagnostic Studies: Dg Chest 1 View  11/13/2013   CLINICAL DATA:  Altered mental status.  Alcohol  EXAM: CHEST - 1 VIEW  COMPARISON:  11/13/2011  FINDINGS: The heart size and mediastinal contours are within normal limits. Both lungs are clear. The visualized skeletal structures are unremarkable.  IMPRESSION: No active disease.   Electronically Signed   By: Franchot Gallo M.D.   On: 11/13/2013 22:19   Dg Ribs Unilateral W/chest Left  11/16/2013   CLINICAL DATA:  Left anterior rib pain  EXAM: LEFT RIBS AND CHEST - 3+ VIEW  COMPARISON:  DG RIBS UNILATERAL W/CHEST*L* dated 11/14/2013  FINDINGS: There is a nondisplaced fracture of the left anterolateral ninth rib. There is no evidence of pneumothorax or pleural effusion. Both lungs are clear. The lungs are hyperinflated likely secondary to COPD. Heart size and mediastinal contours are within normal limits.  IMPRESSION: Nondisplaced fracture of the left anterolateral ninth rib.   Electronically Signed   By: Kathreen Devoid   On: 11/16/2013 18:07   Dg Ribs Unilateral W/chest Left  11/14/2013   CLINICAL DATA:  Chest pain following injury  EXAM: LEFT RIBS AND CHEST -  3+ VIEW  COMPARISON:  Shawn [name].  FINDINGS: No fracture or other bone lesions are seen involving the ribs. There is no evidence of pneumothorax or pleural effusion. Both lungs are clear. Heart size and mediastinal contours are within normal limits.  IMPRESSION: No acute abnormality noted.   Electronically Signed   By: Inez Catalina M.D.   On: 11/14/2013 08:39   Dg Hip Bilateral W/pelvis  11/16/2013   CLINICAL DATA:  Bilateral hip pain, fall  EXAM: BILATERAL HIP WITH PELVIS - 4+ VIEW  COMPARISON:  11/14/2013  FINDINGS: IM nail with compression screw in proximal left femur post ORIF.  Hardware intact.  Diffuse osseous demineralization.  Mild narrowing of the hip joints bilaterally.  Slight narrowing of left SI joint versus right, unchanged.  No acute fracture, dislocation or bone destruction.  IMPRESSION: Osseous demineralization with mild degenerative changes of the hip joints and evidence of prior left hip ORIF.  No acute abnormalities.  Electronically Signed   By: Lavonia Dana M.D.   On: 11/16/2013 18:06   Dg Hip Bilateral W/pelvis  11/14/2013   CLINICAL DATA:  Hip pain  EXAM: BILATERAL HIP WITH PELVIS - 4+ VIEW  COMPARISON:  MR HIP*L* W/O CM dated 08/06/2012; DG HIP OPERATIVE*L* dated 08/06/2012  FINDINGS: Dynamic left femoral nail in place. Bones are subjectively osteopenic. No displaced pelvic fracture is identified. Both femoral heads appear properly located. No proximal femoral fracture is identified on either side.  IMPRESSION: No fracture or dislocation identified.   Electronically Signed   By: Conchita Paris M.D.   On: 11/14/2013 08:45   Ct Head Wo Contrast  11/16/2013   CLINICAL DATA:  Multiple falls this week  EXAM: CT HEAD WITHOUT CONTRAST  TECHNIQUE: Contiguous axial images were obtained from the base of the skull through the vertex without intravenous contrast.  COMPARISON:  CT HEAD W/O CM dated 11/14/2013; CT HEAD W/O CM dated 09/15/2013  FINDINGS: No hemorrhage or extra-axial fluid. No skull  fracture. Moderate diffuse atrophy an low attenuation in the deep white matter. Tiny lacunar infarct right frontoparietal region above the level of the ventricles image number 21, measuring less than a cm, stable. No vascular territory infarct.  IMPRESSION: Chronic involutional change.  No acute findings.   Electronically Signed   By: Skipper Cliche M.D.   On: 11/16/2013 18:10   Ct Head Wo Contrast  11/14/2013   CLINICAL DATA:  Headache, fall  EXAM: CT HEAD WITHOUT CONTRAST  TECHNIQUE: Contiguous axial images were obtained from the base of the skull through the vertex without intravenous contrast.  COMPARISON:  CT HEAD W/O CM dated 09/15/2013  FINDINGS: Mild cortical volume loss noted with proportional ventricular prominence. Areas of periventricular white matter hypodensity are most compatible with small vessel ischemic change. No acute hemorrhage, infarct, or mass lesion is identified. No midline shift. Orbits are grossly unremarkable. Mild ethmoid mucoperiosteal thickening noted. No skull fracture.  IMPRESSION: No acute intracranial finding.  Remote findings as above.   Electronically Signed   By: Conchita Paris M.D.   On: 11/14/2013 08:27   Ct Abdomen Pelvis W Contrast  11/21/2013   CLINICAL DATA:  Diarrhea  EXAM: CT ABDOMEN AND PELVIS WITH CONTRAST  TECHNIQUE: Multidetector CT imaging of the abdomen and pelvis was performed using the standard protocol following bolus administration of intravenous contrast.  CONTRAST:  78mL OMNIPAQUE IOHEXOL 300 MG/ML  SOLN  COMPARISON:  Shawn [name].  FINDINGS: Deep and atelectasis at the lung bases.  Diffuse hepatic steatosis.  The gallbladder is decompressed. Heterogeneous densities in the fundus or worrisome for stones.  Spleen, pancreas, adrenal glands are within normal limits.  There simple cyst in the kidneys. Focal scarring in the interpolar region and lower pole of the left kidney. Some hypodensities in the kidneys are too small to characterize.  No focal bowel wall  thickening of the colon. No disproportionate dilatation of small bowel. Lower abdominal hernia is noted with a very wide neck. The hernia sac contains primarily small bowel loops.  No free-fluid.  No abnormal retroperitoneal adenopathy.  No acute bony deformity. Moderate degenerative change in the lumbar spine. Multifactorial spinal stenosis is evident and L4-5.  Mild atherosclerotic changes of the visceral vasculature. SMA is patent. Celiac and IMA are grossly patent.  IMPRESSION: Findings are worrisome for cholelithiasis. Ultrasound can be performed to further characterize.  Lower abdominal hernia containing small bowel loops without evidence of small bowel obstruction.   Electronically Signed   By:  Art  Hoss M.D.   On: 11/21/2013 08:10    Microbiology: Recent Results (from the past 240 hour(s))  STOOL CULTURE     Status: Shawn [name]   Collection Time    11/21/13  1:28 PM      Result Value Ref Range Status   Specimen Description STOOL   Final   Special Requests Shawn [name]   Final   Culture     Final   Value: NO SALMONELLA, SHIGELLA, CAMPYLOBACTER, YERSINIA, OR E.COLI 0157:H7 ISOLATED     Performed at [facility]   Report Status 11/25/2013 FINAL   Final  CLOSTRIDIUM DIFFICILE BY PCR     Status: Shawn [name]   Collection Time    11/21/13  1:28 PM      Result Value Ref Range Status   C difficile by pcr NEGATIVE  NEGATIVE Final   Comment: Performed at [place]: Basic Metabolic Panel:  Recent Labs Lab 11/20/13 1844 11/21/13 0523 11/22/13 0453 11/23/13 0430  NA 142 138 140 141  K 3.6* 2.8* 3.9 3.4*  CL 102 103 107 105  CO2 29 24 26 26   GLUCOSE 104* 92 83 86  BUN 11 9 4* 3*  CREATININE 0.80 0.70 0.76 0.71  CALCIUM 9.1 8.3* 8.1* 8.2*  MG  --  1.5 1.6  --    Liver Function Tests:  Recent Labs Lab 11/20/13 1844 11/22/13 0453  AST 48* 23  ALT 36 21  ALKPHOS 80 61  BILITOT 0.6 0.3  PROT 6.5 4.9*  ALBUMIN 3.4* 2.5*    Recent Labs Lab 11/22/13 0453  LIPASE 9*    No results found for this basename: AMMONIA,  in the last 168 hours CBC:  Recent Labs Lab 11/20/13 1844  11/21/13 1109 11/22/13 0453 11/23/13 0430 11/24/13 0433 11/25/13 0445  WBC 1.6*  < > 1.5* 2.1* 3.1* 2.8* 2.6*  NEUTROABS 1.3*  --  1.0*  --   --   --   --   HGB 15.2  < > 14.5 13.3 13.1 13.0 13.1  HCT 45.7  < > 41.7 38.9* 36.9* 37.8* 38.2*  MCV 97.0  < > 96.1 97.0 93.9 94.5 94.3  PLT 142*  < > 149* 133* 160 170 186  < > = values in this interval not displayed. Cardiac Enzymes: No results found for this basename: CKTOTAL, CKMB, CKMBINDEX, TROPONINI,  in the last 168 hours BNP: BNP (last 3 results) No results found for this basename: PROBNP,  in the last 8760 hours CBG: No results found for this basename: GLUCAP,  in the last 168 hours     Signed:  Louellen Molder  [facility] 11/25/2013, 11:46 AM

## 2013-11-25 NOTE — Progress Notes (Signed)
Attempted to call report to [facility] and no response.  Will try again later.

## 2013-11-25 NOTE — Progress Notes (Addendum)
Clinical Social Work  CSW faxed DC summary to [employer]. CSW called and left a message with admission's coordinator and will assist with DC once SNF is ready to accept.  [place], [place] [phone]  Addendum: [phone]  SNF called and agreeable to accept patient today after 3pm. CSW prepared DC packet with FL2 and hard scripts included. CSW informed patient and RN of DC plans and all parties agreeable. CSW coordinated transportation via [facility] per patient request. [facility] Request #: [phone].  CSW is signing off but available if needed.

## 2013-11-25 NOTE — Progress Notes (Signed)
RN attempted to call report to [facility].  No response.  Will try again later to call report.

## 2013-11-29 ENCOUNTER — Encounter: Payer: Self-pay | Admitting: Family Medicine

## 2013-12-08 ENCOUNTER — Emergency Department (HOSPITAL_COMMUNITY)
Admission: EM | Admit: 2013-12-08 | Discharge: 2013-12-08 | Disposition: A | Discharge status: Home / Self Care | Payer: Medicare Other | Attending: Emergency Medicine | Admitting: Emergency Medicine

## 2013-12-08 ENCOUNTER — Emergency Department (HOSPITAL_COMMUNITY): Payer: Medicare Other

## 2013-12-08 ENCOUNTER — Encounter (HOSPITAL_COMMUNITY): Payer: Self-pay | Admitting: Emergency Medicine

## 2013-12-08 DIAGNOSIS — R1012 Left upper quadrant pain: Secondary | ICD-10-CM | POA: Insufficient documentation

## 2013-12-08 DIAGNOSIS — Z872 Personal history of diseases of the skin and subcutaneous tissue: Secondary | ICD-10-CM | POA: Insufficient documentation

## 2013-12-08 DIAGNOSIS — E860 Dehydration: Secondary | ICD-10-CM | POA: Insufficient documentation

## 2013-12-08 DIAGNOSIS — R1013 Epigastric pain: Secondary | ICD-10-CM | POA: Insufficient documentation

## 2013-12-08 DIAGNOSIS — Z8719 Personal history of other diseases of the digestive system: Secondary | ICD-10-CM | POA: Insufficient documentation

## 2013-12-08 DIAGNOSIS — F10929 Alcohol use, unspecified with intoxication, unspecified: Secondary | ICD-10-CM

## 2013-12-08 DIAGNOSIS — F17200 Nicotine dependence, unspecified, uncomplicated: Secondary | ICD-10-CM | POA: Insufficient documentation

## 2013-12-08 DIAGNOSIS — Z8639 Personal history of other endocrine, nutritional and metabolic disease: Secondary | ICD-10-CM | POA: Insufficient documentation

## 2013-12-08 DIAGNOSIS — Z8781 Personal history of (healed) traumatic fracture: Secondary | ICD-10-CM | POA: Insufficient documentation

## 2013-12-08 DIAGNOSIS — Z8739 Personal history of other diseases of the musculoskeletal system and connective tissue: Secondary | ICD-10-CM | POA: Insufficient documentation

## 2013-12-08 DIAGNOSIS — Z8659 Personal history of other mental and behavioral disorders: Secondary | ICD-10-CM | POA: Insufficient documentation

## 2013-12-08 DIAGNOSIS — Z9889 Other specified postprocedural states: Secondary | ICD-10-CM | POA: Insufficient documentation

## 2013-12-08 DIAGNOSIS — F1010 Alcohol abuse, uncomplicated: Secondary | ICD-10-CM | POA: Insufficient documentation

## 2013-12-08 LAB — CBC WITH DIFFERENTIAL/PLATELET
BASOS PCT: 1 % (ref 0–1)
Basophils Absolute: 0 10*3/uL (ref 0.0–0.1)
EOS ABS: 0.1 10*3/uL (ref 0.0–0.7)
EOS PCT: 1 % (ref 0–5)
HCT: 49.3 % (ref 39.0–52.0)
Hemoglobin: 17.7 g/dL — ABNORMAL HIGH (ref 13.0–17.0)
LYMPHS ABS: 1 10*3/uL (ref 0.7–4.0)
Lymphocytes Relative: 21 % (ref 12–46)
MCH: 33.8 pg (ref 26.0–34.0)
MCHC: 35.9 g/dL (ref 30.0–36.0)
MCV: 94.1 fL (ref 78.0–100.0)
Monocytes Absolute: 0.3 10*3/uL (ref 0.1–1.0)
Monocytes Relative: 6 % (ref 3–12)
NEUTROS PCT: 71 % (ref 43–77)
Neutro Abs: 3.4 10*3/uL (ref 1.7–7.7)
PLATELETS: 280 10*3/uL (ref 150–400)
RBC: 5.24 MIL/uL (ref 4.22–5.81)
RDW: 12.6 % (ref 11.5–15.5)
WBC: 4.7 10*3/uL (ref 4.0–10.5)

## 2013-12-08 LAB — COMPREHENSIVE METABOLIC PANEL
ALBUMIN: 3.5 g/dL (ref 3.5–5.2)
ALK PHOS: 84 U/L (ref 39–117)
ALT: 24 U/L (ref 0–53)
AST: 28 U/L (ref 0–37)
BUN: 13 mg/dL (ref 6–23)
CALCIUM: 9.3 mg/dL (ref 8.4–10.5)
CO2: 23 mEq/L (ref 19–32)
Chloride: 105 mEq/L (ref 96–112)
Creatinine, Ser: 0.75 mg/dL (ref 0.50–1.35)
GFR calc non Af Amer: >90 mL/min (ref >90)
GLUCOSE: 80 mg/dL (ref 70–99)
POTASSIUM: 4.2 meq/L (ref 3.7–5.3)
Sodium: 146 mEq/L (ref 137–147)
TOTAL PROTEIN: 6.8 g/dL (ref 6.0–8.3)
Total Bilirubin: 0.5 mg/dL (ref 0.3–1.2)

## 2013-12-08 LAB — I-STAT TROPONIN, ED
Troponin i, poc: 0.01 ng/mL (ref 0.00–0.08)
Troponin i, poc: 0.01 ng/mL (ref 0.00–0.08)

## 2013-12-08 LAB — ETHANOL: ALCOHOL ETHYL (B): 181 mg/dL — AB (ref 0–11)

## 2013-12-08 LAB — PRO B NATRIURETIC PEPTIDE: PRO B NATRI PEPTIDE: 95.3 pg/mL (ref 0–125)

## 2013-12-08 LAB — I-STAT CG4 LACTIC ACID, ED
Lactic Acid, Venous: 3.44 mmol/L — ABNORMAL HIGH (ref 0.5–2.2)
Lactic Acid, Venous: 2.45 mmol/L — ABNORMAL HIGH (ref 0.5–2.2)

## 2013-12-08 LAB — LIPASE, BLOOD: Lipase: 19 U/L (ref 11–59)

## 2013-12-08 MED ORDER — SODIUM CHLORIDE 0.9 % IV BOLUS (SEPSIS)
1000.0000 mL | Freq: Once | INTRAVENOUS | Status: AC
  Administered 2013-12-08: 1000 mL via INTRAVENOUS

## 2013-12-08 MED ORDER — IOHEXOL 300 MG/ML  SOLN
25.0000 mL | Freq: Once | INTRAMUSCULAR | Status: AC | PRN
  Administered 2013-12-08: 25 mL via ORAL

## 2013-12-08 MED ORDER — IOHEXOL 300 MG/ML  SOLN
80.0000 mL | Freq: Once | INTRAMUSCULAR | Status: AC | PRN
  Administered 2013-12-08: 80 mL via INTRAVENOUS

## 2013-12-08 MED ORDER — NITROGLYCERIN 0.4 MG SL SUBL
0.4000 mg | SUBLINGUAL_TABLET | SUBLINGUAL | Status: DC | PRN
  Administered 2013-12-08: 0.4 mg via SUBLINGUAL

## 2013-12-08 NOTE — Discharge Instructions (Signed)
Alcohol and Nutrition °Nutrition serves two purposes. It provides energy. It also maintains body structure and function. Food supplies energy. It also provides the building blocks needed to replace worn or damaged cells. Alcoholics often eat poorly. This limits their supply of essential nutrients. This affects energy supply and structure maintenance. Alcohol also affects the body's nutrients in: °· Digestion. °· Storage. °· Using and getting rid of waste products. °IMPAIRMENT OF NUTRIENT DIGESTION AND UTILIZATION  °· Once ingested, food must be broken down into small components (digested). Then it is available for energy. It helps maintain body structure and function. Digestion begins in the mouth. It continues in the stomach and intestines, with help from the pancreas. The nutrients from digested food are absorbed from the intestines into the blood. Then they are carried to the liver. The liver prepares nutrients for: °· Immediate use. °· Storage and future use. °· Alcohol inhibits the breakdown of nutrients into usable molecules. °· It decreases secretion of digestive enzymes from the pancreas. °· Alcohol impairs nutrient absorption by damaging the cells lining the stomach and intestines. °· It also interferes with moving some nutrients into the blood. °· In addition, nutritional deficiencies themselves may lead to further absorption problems. °· For example, folate deficiency changes the cells that line the small intestine. This impairs how water is absorbed. It also affects absorbed nutrients. These include glucose, sodium, and additional folate. °· Even if nutrients are digested and absorbed, alcohol can prevent them from being fully used. It changes their transport, storage, and excretion. Impaired utilization of nutrients by alcoholics is indicated by: °· Decreased liver stores of vitamins, such as vitamin A. °· Increased excretion of nutrients such as fat. °ALCOHOL AND ENERGY SUPPLY  °· Three basic  nutritional components found in food are: °· Carbohydrates. °· Proteins. °· Fats. °· These are used as energy. Some alcoholics take in as much as 50% of their total daily calories from alcohol. They often neglect important foods. °· Even when enough food is eaten, alcohol can impair the ways the body controls blood sugar (glucose) levels. It may either increase or decrease blood sugar. °· In non-diabetic alcoholics, increased blood sugar (hyperglycemia) is caused by poor insulin secretion. It is usually temporary. °· Decreased blood sugar (hypoglycemia) can cause serious injury even if this condition is short-lived. Low blood sugar can happen when a fasting or malnourished person drinks alcohol. When there is no food to supply energy, stored sugar is used up. The products of alcohol inhibit forming glucose from other compounds such as amino acids. As a result, alcohol causes the brain and other body tissue to lack glucose. It is needed for energy and function. °· Alcohol is an energy source. But how the body processes and uses the energy from alcohol is complex. Also, when alcohol is substituted for carbohydrates, subjects tend to lose weight. This indicates that they get less energy from alcohol than from food. °ALCOHOL - MAINTAINING CELL STRUCTURE AND FUNCTION  °Structure °Cells are made mostly of protein. So an adequate protein diet is important for maintaining cell structure. This is especially true if cells are being damaged. Research indicates that alcohol affects protein nutrition by causing impaired: °· Digestion of proteins to amino acids. °· Processing of amino acids by the small intestine and liver. °· Synthesis of proteins from amino acids. °· Protein secretion by the liver. °Function °Nutrients are essential for the body to function well. They provide the tools that the body needs to work well:  °·   Proteins. °· Vitamins. °· Minerals. °Alcohol can disrupt body function. It may cause nutrient  deficiencies. And it may interfere with the way nutrients are processed. °Vitamins °· Vitamins are essential to maintain growth and normal metabolism. They regulate many of the body`s processes. Chronic heavy drinking causes deficiencies in many vitamins. This is caused by eating less. And, in some cases, vitamins may be poorly absorbed. For example, alcohol inhibits fat absorption. It impairs how the vitamins A, E, and D are normally absorbed along with dietary fats. Not enough vitamin A may cause night blindness. Not enough vitamin D may cause softening of the bones. °· Some alcoholics lack vitamins A, C, D, E, K, and the B vitamins. These are all involved in wound healing and cell maintenance. In particular, because vitamin K is necessary for blood clotting, lacking that vitamin can cause delayed clotting. The result is excess bleeding. Lacking other vitamins involved in brain function may cause severe neurological damage. °Minerals °Deficiencies of minerals such as calcium, magnesium, iron, and zinc are common in alcoholics. The alcohol itself does not seem to affect how these minerals are absorbed. Rather, they seem to occur secondary to other alcohol-related problems, such as: °· Less calcium absorbed. °· Not enough magnesium. °· More urinary excretion. °· Vomiting. °· Diarrhea. °· Not enough iron due to gastrointestinal bleeding. °· Not enough zinc or losses related to other nutrient deficiencies. °· Mineral deficiencies can cause a variety of medical consequences. These range from calcium-related bone disease to zinc-related night blindness and skin lesions. °ALCOHOL, MALNUTRITION, AND MEDICAL COMPLICATIONS  °Liver Disease  °· Alcoholic liver damage is caused primarily by alcohol itself. But poor nutrition may increase the risk of alcohol-related liver damage. For example, nutrients normally found in the liver are known to be affected by drinking alcohol. These include carotenoids, which are the major  sources of vitamin A, and vitamin E compounds. Decreases in such nutrients may play some role in alcohol-related liver damage. °Pancreatitis °· Research suggests that malnutrition may increase the risk of developing alcoholic pancreatitis. Research suggests that a diet lacking in protein may increase alcohol's damaging effect on the pancreas. °Brain °· Nutritional deficiencies may have severe effects on brain function. These may be permanent. Specifically, thiamine deficiencies are often seen in alcoholics. They can cause severe neurological problems. These include: °· Impaired movement. °· Memory loss seen in Wernicke-Korsakoff syndrome. °Pregnancy °· Alcohol has toxic effects on fetal development. It causes alcohol-related birth defects. They include fetal alcohol syndrome. Alcohol itself is toxic to the fetus. Also, the nutritional deficiency can affect how the fetus develops. That may compound the risk of developmental damage. °· Nutritional needs during pregnancy are 10% to 30% greater than normal. Food intake can increase by as much as 140% to cover the needs of both mother and fetus. An alcoholic mother`s nutritional problems may adversely affect the nutrition of the fetus. And alcohol itself can also restrict nutrition flow to the fetus. °NUTRITIONAL STATUS OF ALCOHOLICS  °Techniques for assessing nutritional status include: °· Taking body measurements to estimate fat reserves. They include: °· Weight. °· Height. °· Mass. °· Skin fold thickness. °· Performing blood analysis to provide measurements of circulating: °· Proteins. °· Vitamins. °· Minerals. °· These techniques tend to be imprecise. For many nutrients, there is no clear "cut-off" point that would allow an accurate definition of deficiency. So assessing the nutritional status of alcoholics is limited by these techniques. Dietary status may provide information about the risk of developing nutritional problems.   Dietary status is assessed by: °· Taking  patients' dietary histories. °· Evaluating the amount and types of food they are eating. °· It is difficult to determine what exact amount of alcohol begins to have damaging effects on nutrition. In general, moderate drinkers have 2 drinks or less per day. They seem to be at little risk for nutritional problems. Various medical disorders begin to appear at greater levels. °· Research indicates that the majority of even the heaviest drinkers have few obvious nutritional deficiencies. Many alcoholics who are hospitalized for medical complications of their disease do have severe malnutrition. Alcoholics tend to eat poorly. Often they eat less than the amounts of food necessary to provide enough: °· Carbohydrates. °· Protein. °· Fat. °· Vitamins A and C. °· B vitamins. °· Minerals like calcium and iron. °Of major concern is alcohol's effect on digesting food and use of nutrients. It may shift a mildly malnourished person toward severe malnutrition. °Document Released: 06/13/2005 Document Revised: 11/11/2011 Document Reviewed: 11/27/2005 °[employer]® Patient Information ©2014 [employer]. ° °

## 2013-12-08 NOTE — ED Notes (Signed)
CT notified of pt finishing CT contrast.  

## 2013-12-08 NOTE — ED Provider Notes (Signed)
CSN: [id number]     Arrival date & time 12/08/13  1610 History   First MD Initiated Contact with Patient 12/08/13 1613     Chief Complaint  Patient presents with  . Chest Pain     (Consider location/radiation/quality/duration/timing/severity/associated sxs/prior Treatment) Patient is a 63 y.o. male presenting with chest pain. The history is provided by the patient.  Chest Pain Chest pain location: at subchondric area, unsure if in abdomen or in chest, as poorly localized. Pain quality: dull   Pain radiates to:  Does not radiate Pain radiates to the back: no   Pain severity:  Mild Onset quality:  Gradual Duration:  3 days Timing:  Constant Progression:  Worsening Chronicity:  New Context: at rest   Context: not breathing, not lifting and no trauma   Relieved by:  Nothing Worsened by:  Nothing tried Ineffective treatments:  None tried Associated symptoms: abdominal pain   Associated symptoms: no altered mental status, no cough, no diaphoresis, no fever, no nausea, no near-syncope, no numbness, no shortness of breath, not vomiting and no weakness   Risk factors: male sex, smoking and surgery   Risk factors: no coronary artery disease, no diabetes mellitus, no high cholesterol and not obese     Past Medical History  Diagnosis Date  . Osteoarthritis X years    left knee.  Distant hx (age 49) "dislocated" knee and required surgery, then crush injury to patella required knee cap removal.  . Diverticular disease     "itis" x 2 episodes (last was about 2007)  . Tobacco dependence   . Multiple rib fractures 10/09/10    s/p fall down a ravine--9th and 10th on right, contusion of left 7th and 8th ribs  . Fracture of metatarsal bone(s), closed     3rd, 4th, 5th mid shaft on left foot  . History of substance abuse     cocaine, marijuana, acid, alcohol (in remission since 2010)  . Solar dermatitis     face and ears  . Bipolar disorder     Has had multiple psych hospitalization in the  past, most recently at [facility] 01/31/11-02/04/11.  Says meds made him emotionally blunted. (lamictal and wellbutrin).  . Alcoholism     "Dry" since 2002; relapse 2014  . Depression   . Anxiety   . Vitamin B12 deficiency 10/2013    IF ab positive.  Replacement therapy started end of March 2015   Past Surgical History  Procedure Laterality Date  . Knee dislocation surgery  1966  . Patellectomy  1968    s/p crush injury  . Inguinal hernia repair      left  . Hernia repair      at 63 years of age  69  . Total knee arthroplasty  11/20/2011    Procedure: TOTAL KNEE ARTHROPLASTY;  Surgeon: Ninetta Lights, MD;  Location: [facility];  Service: Orthopedics;  Laterality: Left;  DR Percell Miller WANTS 90MINUTES FOR THIS CASE  . Inguinal hernia repair  06/15/2012    Procedure: HERNIA REPAIR INGUINAL INCARCERATED;  Surgeon: Edward Jolly, MD;  Location: [facility];  Service: General;  Laterality: Right;  . Bowel resection  06/15/2012    Procedure: SMALL BOWEL RESECTION;  Surgeon: Edward Jolly, MD;  Location: [facility];  Service: General;  Laterality: N/A;  . Femur im nail  08/06/2012    Procedure: INTRAMEDULLARY (IM) NAIL FEMORAL;  Surgeon: Johnn Hai, MD;  Location: [facility];  Service: Orthopedics;  Laterality:  Left;   Family History  Problem Relation Age of Onset  . Arthritis Mother   . Arthritis Father   . Cancer Brother     lung cancer.  Died in early 66s.   History  Substance Use Topics  . Smoking status: Current Every Day Smoker -- 1.00 packs/day for 30 years    Types: Cigarettes  . Smokeless tobacco: Never Used  . Alcohol Use: Yes     Comment: daily 6 beers and 1 pint of vodka    Review of Systems  Constitutional: Negative for fever, diaphoresis, activity change and appetite change.  HENT: Negative for congestion and rhinorrhea.   Eyes: Negative for discharge and itching.  Respiratory: Negative for cough, shortness of breath and wheezing.   Cardiovascular: Positive for  chest pain. Negative for near-syncope.  Gastrointestinal: Positive for abdominal pain. Negative for nausea, vomiting, diarrhea and constipation.  Genitourinary: Negative for hematuria, decreased urine volume and difficulty urinating.  Skin: Negative for rash and wound.  Neurological: Negative for syncope, weakness and numbness.  All other systems reviewed and are negative.     Allergies  Codeine; Depakote; and Depakote er  Home Medications   Current Outpatient Rx  Name  Route  Sig  Dispense  Refill  . hydroxypropyl methylcellulose (ISOPTO TEARS) 2.5 % ophthalmic solution   Both Eyes   Place 2 drops into both eyes as needed for dry eyes.          BP 144/78  Pulse 68  Temp(Src) 98.2 F (36.8 C) (Oral)  Resp 22  SpO2 99% Physical Exam  Vitals reviewed. Constitutional: He is oriented to person, place, and time. He appears well-developed and well-nourished. No distress.  Moderately dehydrated  HENT:  Head: Normocephalic and atraumatic.  Mouth/Throat: Oropharynx is clear and moist. No oropharyngeal exudate.  Eyes: Conjunctivae and EOM are normal. Pupils are equal, round, and reactive to light. Right eye exhibits no discharge. Left eye exhibits no discharge. No scleral icterus.  Neck: Normal range of motion. Neck supple.  Cardiovascular: Normal rate, regular rhythm, normal heart sounds and intact distal pulses.  Exam reveals no gallop and no friction rub.   No murmur heard. Pulmonary/Chest: Effort normal and breath sounds normal. No respiratory distress. He has no wheezes. He has no rales. He exhibits no tenderness.  Abdominal: Soft. He exhibits no distension and no mass. There is tenderness (epigastric and LUQ ttp, soft without rigidity).  Musculoskeletal: Normal range of motion.  Neurological: He is alert and oriented to person, place, and time. No cranial nerve deficit. He exhibits normal muscle tone. Coordination normal.  Skin: Skin is warm. No rash noted. He is not  diaphoretic.    ED Course  Procedures (including critical care time) Labs Review Labs Reviewed  CBC WITH DIFFERENTIAL - Abnormal; Notable for the following:    Hemoglobin 17.7 (*)    All other components within normal limits  ETHANOL - Abnormal; Notable for the following:    Alcohol, Ethyl (B) 181 (*)    All other components within normal limits  I-STAT CG4 LACTIC ACID, ED - Abnormal; Notable for the following:    Lactic Acid, Venous 3.44 (*)    All other components within normal limits  I-STAT CG4 LACTIC ACID, ED - Abnormal; Notable for the following:    Lactic Acid, Venous 2.45 (*)    All other components within normal limits  COMPREHENSIVE METABOLIC PANEL  PRO B NATRIURETIC PEPTIDE  LIPASE, BLOOD  I-STAT TROPOININ, ED  Randolm Idol, ED  Imaging Review Ct Abdomen Pelvis W Contrast  12/08/2013   CLINICAL DATA:  Left-sided abdominal pain.  EXAM: CT ABDOMEN AND PELVIS WITH CONTRAST  TECHNIQUE: Multidetector CT imaging of the abdomen and pelvis was performed using the standard protocol following bolus administration of intravenous contrast.  CONTRAST:  61mL OMNIPAQUE IOHEXOL 300 MG/ML  SOLN  COMPARISON:  11/20/2013.  FINDINGS: The lung bases are clear except for dependent atelectasis. The heart is normal in size. No pericardial effusion.  The liver is unremarkable. No focal hepatic lesions or biliary dilatation. Gallbladder demonstrates multiple folds and mild irregular wall enhancement. Findings could be due to adenomyomatosis. No common bile duct dilatation. The pancreas is normal. The spleen is normal. The adrenal glands and kidneys are unremarkable and stable. There are small cysts and left renal scarring changes.  The stomach, duodenum, small bowel and colon are unremarkable. No acute inflammatory findings, mass lesions or obstructive findings. There is significant diverticulosis of the sigmoid colon but no definite findings for acute diverticulitis. No mesenteric or retroperitoneal  mass or adenopathy. The aorta and branch vessels are patent. The major venous structures are patent.  The bladder, prostate gland and seminal vesicles are unremarkable. No pelvic mass, adenopathy or free pelvic fluid collections. No inguinal mass or hernia. There is a small anterior abdominal wall hernia at the level of the thumb bowl like is which contains small bowel loops. No obstruction or incarceration.  The bony structures are unremarkable and stable.  IMPRESSION: No acute abdominal/ pelvic findings, mass lesions or adenopathy.  Diffuse colonic diverticulosis most notable in the sigmoid region but no findings for acute diverticulitis.  Small anterior abdominal wall hernia containing small bowel loops but no obstruction or incarceration.  Possible changes of adenomyomatosis involving the gallbladder.   Electronically Signed   By: Kalman Jewels M.D.   On: 12/08/2013 18:20   Dg Chest Portable 1 View  12/08/2013   CLINICAL DATA:  Chest pain with shortness of breath; history of tobacco use.  EXAM: PORTABLE CHEST - 1 VIEW  COMPARISON:  DG RIBS UNILATERAL W/CHEST*L* dated 11/16/2013; DG RIBS UNILATERAL W/CHEST*L* dated 11/14/2013; DG CHEST 1 VIEW dated 11/13/2013  FINDINGS: The lungs are well-expanded. He interstitial markings are increased bilaterally. There is no alveolar infiltrate. The cardiac silhouette is normal in size. The pulmonary vascularity is not engorged. There is no pleural effusion. The mediastinum is normal in width.  IMPRESSION: There is mild hyperinflation consistent with COPD. Increased interstitial markings are in part related to previous tobacco use but there is increased conspicuity of the interstitial markings. This may reflect acute bronchitis with subsegmental atelectasis. There is no alveolar pneumonia.   Electronically Signed   By: David  Martinique   On: 12/08/2013 16:45     EKG Interpretation   Date/Time:  Wednesday December 08 2013 20:13:31 EDT Ventricular Rate:  52 PR Interval:   190 QRS Duration: 162 QT Interval:  529 QTC Calculation: 492 R Axis:   62 Text Interpretation:  Sinus rhythm Right bundle branch block Baseline  wander in lead(s) I III aVL Confirmed by Christy Gentles  MD, Elenore Rota ([id number]) on  12/08/2013 8:33:25 PM      MDM   MDM: 63 y.o. WM W/ PMHx of Alcohol abuse, anxiety, Polysubstance abuse comes in with chest/abd pain for 3 days. Pt states dull pain, at border of chest and abd. Denies SOB, n/v/d, HA, nu mbness, weakness. No hx of similar. AFVSS. Initially hypotensive per EMS, but normotensive here. Appears mild/moderate dehydration. Abd tender, not  rigid. EKG without stemi. Trop negative. Labs WNL except for lactate elevated and EtOH 181. CT Abd obtained, negative for acute process. Given pain meds and fluid with improvement in sxs. Lactate rechecked after fluids and time and 2.4, improved. Pt clinically sober. Suspect patient eelvated lactate from EtOH intoxication and dehydration. States sxs improved. Delta trop negative, EKG unchanged. When I told patient he would be discharged, he immediately said he felt worse and asked what time of day it was. I asked him why it mattered, to which he responded "If it is dark out, my pain is worse." With this statement,a nd him previously feeling better prior to me saying he was discharged, suspect an element of malingering. However, patient feels better, has improved labs, stable vitals, and suspecti nitial presentation result of dehydration and EtOH intoxication. Recommend Return to ER if sxs come back. Discharged. Care of case d/w my attending.  Final diagnoses:  Alcohol intoxication  Dehydration    Discharged   Sol Passer, MD 12/09/13 [phone]

## 2013-12-08 NOTE — ED Notes (Signed)
Per EMS, pt from home with chest pain that started about 3 days ago.  Pt sts it feels like "someone ran over me with a car".  Pt hypotensive with EMS at 80/50 standing.  Pt also SB on the monitor. Pt had 324 ASA PTA.  No nitro given due to pain.

## 2013-12-08 NOTE — ED Notes (Signed)
CG-result shown to both Dr. Christy Gentles and the Resident

## 2013-12-08 NOTE — ED Notes (Signed)
Cg-4 results reported to Dr. Christy Gentles

## 2013-12-08 NOTE — ED Provider Notes (Signed)
Patient seen/examined in the Emergency Department in conjunction with Resident Physician Provider [facility] Patient reports chest and abdominal pain Exam : awake/alert, he has diffuse abdominal tenderness, mostly in LUQ and epigastric region.   Plan: will need CT imaging as patient has diffuse abd tenderness with elevated lactate   Sharyon Cable, MD 12/08/13 [phone]

## 2013-12-08 NOTE — ED Notes (Signed)
[place] at bedside 

## 2013-12-08 NOTE — ED Provider Notes (Signed)
Date: 12/08/2013  Rate: 54  Rhythm: sinus bradycardia  QRS Axis: normal  Intervals: normal  ST/T Wave abnormalities: nonspecific ST changes  Conduction Disutrbances:right bundle branch block  Narrative Interpretation:   Old EKG Reviewed: unchanged NO STEMI NOTED    Sharyon Cable, MD 12/08/13 1626

## 2013-12-09 NOTE — ED Provider Notes (Signed)
I have personally seen and examined the patient.  I have discussed the plan of care with the resident.  I have reviewed the documentation on PMH/FH/Soc. History.  I have reviewed the documentation of the resident and agree.   Sharyon Cable, MD 12/09/13 1800

## 2013-12-21 ENCOUNTER — Telehealth: Payer: Self-pay | Admitting: Family Medicine

## 2013-12-21 NOTE — Telephone Encounter (Signed)
Kim from [facility] called. She is requesting a CB from our office in reguards to a home health referral.

## 2013-12-21 NOTE — Telephone Encounter (Signed)
Spoke with Maudie Mercury from [employer].  She said she needs pt's PCP to sign off on home health orders.   Pt hasn't been seen in this office since 02/2012.  Dr. Anitra Lauth said that patient would need an office visit in order to sign these orders.  Kim aware that patient needs to come back to check in before these orders can be signed.

## 2013-12-24 DIAGNOSIS — K056 Periodontal disease, unspecified: Secondary | ICD-10-CM | POA: Insufficient documentation

## 2014-01-03 DIAGNOSIS — F10988 Alcohol use, unspecified with other alcohol-induced disorder: Secondary | ICD-10-CM | POA: Insufficient documentation

## 2014-01-03 DIAGNOSIS — F1097 Alcohol use, unspecified with alcohol-induced persisting dementia: Secondary | ICD-10-CM | POA: Insufficient documentation

## 2015-11-01 DIAGNOSIS — Z8619 Personal history of other infectious and parasitic diseases: Secondary | ICD-10-CM

## 2015-11-01 HISTORY — DX: Personal history of other infectious and parasitic diseases: Z86.19

## 2015-11-17 ENCOUNTER — Inpatient Hospital Stay (HOSPITAL_COMMUNITY): Payer: Medicare Other

## 2015-11-17 ENCOUNTER — Inpatient Hospital Stay (HOSPITAL_COMMUNITY)
Admission: EM | Admit: 2015-11-17 | Discharge: 2015-11-29 | DRG: 853 | Disposition: A | Discharge status: Skilled Nursing Facility | Payer: Medicare Other | Attending: Internal Medicine | Admitting: Internal Medicine

## 2015-11-17 ENCOUNTER — Emergency Department (HOSPITAL_COMMUNITY): Payer: Medicare Other

## 2015-11-17 ENCOUNTER — Emergency Department (HOSPITAL_COMMUNITY): Payer: Medicare Other | Admitting: Certified Registered Nurse Anesthetist

## 2015-11-17 ENCOUNTER — Encounter (HOSPITAL_COMMUNITY)
Admission: EM | Disposition: A | Discharge status: Skilled Nursing Facility | Payer: Self-pay | Source: Home / Self Care | Attending: Internal Medicine

## 2015-11-17 DIAGNOSIS — Z6824 Body mass index (BMI) 24.0-24.9, adult: Secondary | ICD-10-CM | POA: Diagnosis not present

## 2015-11-17 DIAGNOSIS — I480 Paroxysmal atrial fibrillation: Secondary | ICD-10-CM | POA: Diagnosis not present

## 2015-11-17 DIAGNOSIS — K920 Hematemesis: Secondary | ICD-10-CM | POA: Diagnosis present

## 2015-11-17 DIAGNOSIS — J9690 Respiratory failure, unspecified, unspecified whether with hypoxia or hypercapnia: Secondary | ICD-10-CM

## 2015-11-17 DIAGNOSIS — Z79899 Other long term (current) drug therapy: Secondary | ICD-10-CM | POA: Diagnosis not present

## 2015-11-17 DIAGNOSIS — Z978 Presence of other specified devices: Secondary | ICD-10-CM

## 2015-11-17 DIAGNOSIS — E872 Acidosis: Secondary | ICD-10-CM | POA: Diagnosis present

## 2015-11-17 DIAGNOSIS — R109 Unspecified abdominal pain: Secondary | ICD-10-CM | POA: Diagnosis present

## 2015-11-17 DIAGNOSIS — A419 Sepsis, unspecified organism: Principal | ICD-10-CM | POA: Diagnosis present

## 2015-11-17 DIAGNOSIS — F418 Other specified anxiety disorders: Secondary | ICD-10-CM | POA: Diagnosis present

## 2015-11-17 DIAGNOSIS — Y95 Nosocomial condition: Secondary | ICD-10-CM | POA: Diagnosis not present

## 2015-11-17 DIAGNOSIS — R6521 Severe sepsis with septic shock: Secondary | ICD-10-CM | POA: Diagnosis present

## 2015-11-17 DIAGNOSIS — F1010 Alcohol abuse, uncomplicated: Secondary | ICD-10-CM | POA: Diagnosis not present

## 2015-11-17 DIAGNOSIS — F17210 Nicotine dependence, cigarettes, uncomplicated: Secondary | ICD-10-CM | POA: Diagnosis present

## 2015-11-17 DIAGNOSIS — E162 Hypoglycemia, unspecified: Secondary | ICD-10-CM | POA: Diagnosis not present

## 2015-11-17 DIAGNOSIS — R000 Tachycardia, unspecified: Secondary | ICD-10-CM | POA: Insufficient documentation

## 2015-11-17 DIAGNOSIS — I5043 Acute on chronic combined systolic (congestive) and diastolic (congestive) heart failure: Secondary | ICD-10-CM | POA: Insufficient documentation

## 2015-11-17 DIAGNOSIS — I442 Atrioventricular block, complete: Secondary | ICD-10-CM | POA: Diagnosis not present

## 2015-11-17 DIAGNOSIS — I2119 ST elevation (STEMI) myocardial infarction involving other coronary artery of inferior wall: Secondary | ICD-10-CM | POA: Diagnosis not present

## 2015-11-17 DIAGNOSIS — D62 Acute posthemorrhagic anemia: Secondary | ICD-10-CM | POA: Diagnosis present

## 2015-11-17 DIAGNOSIS — Z96652 Presence of left artificial knee joint: Secondary | ICD-10-CM | POA: Diagnosis present

## 2015-11-17 DIAGNOSIS — K650 Generalized (acute) peritonitis: Secondary | ICD-10-CM | POA: Diagnosis present

## 2015-11-17 DIAGNOSIS — J150 Pneumonia due to Klebsiella pneumoniae: Secondary | ICD-10-CM | POA: Diagnosis not present

## 2015-11-17 DIAGNOSIS — F1020 Alcohol dependence, uncomplicated: Secondary | ICD-10-CM | POA: Diagnosis present

## 2015-11-17 DIAGNOSIS — R0902 Hypoxemia: Secondary | ICD-10-CM | POA: Diagnosis not present

## 2015-11-17 DIAGNOSIS — R001 Bradycardia, unspecified: Secondary | ICD-10-CM | POA: Diagnosis not present

## 2015-11-17 DIAGNOSIS — L8990 Pressure ulcer of unspecified site, unspecified stage: Secondary | ICD-10-CM | POA: Diagnosis not present

## 2015-11-17 DIAGNOSIS — R739 Hyperglycemia, unspecified: Secondary | ICD-10-CM | POA: Diagnosis present

## 2015-11-17 DIAGNOSIS — F3170 Bipolar disorder, currently in remission, most recent episode unspecified: Secondary | ICD-10-CM | POA: Insufficient documentation

## 2015-11-17 DIAGNOSIS — N170 Acute kidney failure with tubular necrosis: Secondary | ICD-10-CM | POA: Diagnosis present

## 2015-11-17 DIAGNOSIS — K922 Gastrointestinal hemorrhage, unspecified: Secondary | ICD-10-CM

## 2015-11-17 DIAGNOSIS — K559 Vascular disorder of intestine, unspecified: Secondary | ICD-10-CM | POA: Diagnosis not present

## 2015-11-17 DIAGNOSIS — R0682 Tachypnea, not elsewhere classified: Secondary | ICD-10-CM | POA: Insufficient documentation

## 2015-11-17 DIAGNOSIS — I255 Ischemic cardiomyopathy: Secondary | ICD-10-CM | POA: Diagnosis present

## 2015-11-17 DIAGNOSIS — K6389 Other specified diseases of intestine: Secondary | ICD-10-CM | POA: Diagnosis present

## 2015-11-17 DIAGNOSIS — R5381 Other malaise: Secondary | ICD-10-CM | POA: Diagnosis present

## 2015-11-17 DIAGNOSIS — J9600 Acute respiratory failure, unspecified whether with hypoxia or hypercapnia: Secondary | ICD-10-CM | POA: Diagnosis not present

## 2015-11-17 DIAGNOSIS — I4819 Other persistent atrial fibrillation: Secondary | ICD-10-CM | POA: Insufficient documentation

## 2015-11-17 DIAGNOSIS — G9340 Encephalopathy, unspecified: Secondary | ICD-10-CM | POA: Diagnosis present

## 2015-11-17 DIAGNOSIS — E440 Moderate protein-calorie malnutrition: Secondary | ICD-10-CM | POA: Insufficient documentation

## 2015-11-17 DIAGNOSIS — J9601 Acute respiratory failure with hypoxia: Secondary | ICD-10-CM | POA: Diagnosis not present

## 2015-11-17 DIAGNOSIS — R451 Restlessness and agitation: Secondary | ICD-10-CM | POA: Diagnosis not present

## 2015-11-17 DIAGNOSIS — E876 Hypokalemia: Secondary | ICD-10-CM | POA: Insufficient documentation

## 2015-11-17 DIAGNOSIS — E8809 Other disorders of plasma-protein metabolism, not elsewhere classified: Secondary | ICD-10-CM | POA: Diagnosis not present

## 2015-11-17 DIAGNOSIS — T502X5A Adverse effect of carbonic-anhydrase inhibitors, benzothiadiazides and other diuretics, initial encounter: Secondary | ICD-10-CM | POA: Diagnosis present

## 2015-11-17 DIAGNOSIS — R1310 Dysphagia, unspecified: Secondary | ICD-10-CM | POA: Diagnosis present

## 2015-11-17 DIAGNOSIS — D72829 Elevated white blood cell count, unspecified: Secondary | ICD-10-CM | POA: Insufficient documentation

## 2015-11-17 DIAGNOSIS — I5023 Acute on chronic systolic (congestive) heart failure: Secondary | ICD-10-CM | POA: Diagnosis not present

## 2015-11-17 DIAGNOSIS — I481 Persistent atrial fibrillation: Secondary | ICD-10-CM | POA: Diagnosis present

## 2015-11-17 DIAGNOSIS — J811 Chronic pulmonary edema: Secondary | ICD-10-CM

## 2015-11-17 DIAGNOSIS — R52 Pain, unspecified: Secondary | ICD-10-CM | POA: Diagnosis not present

## 2015-11-17 DIAGNOSIS — Z9911 Dependence on respirator [ventilator] status: Secondary | ICD-10-CM | POA: Insufficient documentation

## 2015-11-17 DIAGNOSIS — T380X5A Adverse effect of glucocorticoids and synthetic analogues, initial encounter: Secondary | ICD-10-CM | POA: Diagnosis present

## 2015-11-17 DIAGNOSIS — R609 Edema, unspecified: Secondary | ICD-10-CM

## 2015-11-17 DIAGNOSIS — F1910 Other psychoactive substance abuse, uncomplicated: Secondary | ICD-10-CM | POA: Insufficient documentation

## 2015-11-17 DIAGNOSIS — Z9289 Personal history of other medical treatment: Secondary | ICD-10-CM | POA: Insufficient documentation

## 2015-11-17 DIAGNOSIS — N179 Acute kidney failure, unspecified: Secondary | ICD-10-CM | POA: Insufficient documentation

## 2015-11-17 DIAGNOSIS — J440 Chronic obstructive pulmonary disease with acute lower respiratory infection: Secondary | ICD-10-CM | POA: Diagnosis present

## 2015-11-17 DIAGNOSIS — L309 Dermatitis, unspecified: Secondary | ICD-10-CM | POA: Diagnosis present

## 2015-11-17 DIAGNOSIS — E2740 Unspecified adrenocortical insufficiency: Secondary | ICD-10-CM | POA: Diagnosis present

## 2015-11-17 HISTORY — PX: LAPAROTOMY: SHX154

## 2015-11-17 LAB — CBC
HEMATOCRIT: 39.2 % (ref 39.0–52.0)
HEMOGLOBIN: 13.1 g/dL (ref 13.0–17.0)
MCH: 30.5 pg (ref 26.0–34.0)
MCHC: 33.4 g/dL (ref 30.0–36.0)
MCV: 91.2 fL (ref 78.0–100.0)
Platelets: 218 10*3/uL (ref 150–400)
RBC: 4.3 MIL/uL (ref 4.22–5.81)
RDW: 13.9 % (ref 11.5–15.5)
WBC: 6.1 10*3/uL (ref 4.0–10.5)

## 2015-11-17 LAB — COMPREHENSIVE METABOLIC PANEL
ALBUMIN: 2.8 g/dL — AB (ref 3.5–5.0)
ALK PHOS: 69 U/L (ref 38–126)
ALT: 13 U/L — AB (ref 17–63)
ALT: 10 U/L — AB (ref 17–63)
ANION GAP: 32 — AB (ref 5–15)
ANION GAP: 19 — AB (ref 5–15)
AST: 39 U/L (ref 15–41)
AST: 27 U/L (ref 15–41)
Albumin: 2 g/dL — ABNORMAL LOW (ref 3.5–5.0)
Alkaline Phosphatase: 106 U/L (ref 38–126)
BILIRUBIN TOTAL: 0.8 mg/dL (ref 0.3–1.2)
BUN: 58 mg/dL — ABNORMAL HIGH (ref 6–20)
BUN: 57 mg/dL — ABNORMAL HIGH (ref 6–20)
CALCIUM: 6.5 mg/dL — AB (ref 8.9–10.3)
CHLORIDE: 95 mmol/L — AB (ref 101–111)
CO2: 27 mmol/L (ref 22–32)
CO2: 24 mmol/L (ref 22–32)
CREATININE: 7.47 mg/dL — AB (ref 0.61–1.24)
CREATININE: 6.23 mg/dL — AB (ref 0.61–1.24)
Calcium: 8.1 mg/dL — ABNORMAL LOW (ref 8.9–10.3)
Chloride: 104 mmol/L (ref 101–111)
GFR calc non Af Amer: 7 mL/min — ABNORMAL LOW (ref >60)
GFR calc non Af Amer: 8 mL/min — ABNORMAL LOW (ref >60)
GFR, EST AFRICAN AMERICAN: 8 mL/min — AB (ref >60)
GFR, EST AFRICAN AMERICAN: 10 mL/min — AB (ref >60)
GLUCOSE: 139 mg/dL — AB (ref 65–99)
GLUCOSE: 137 mg/dL — AB (ref 65–99)
Potassium: 3.7 mmol/L (ref 3.5–5.1)
Potassium: 3.9 mmol/L (ref 3.5–5.1)
SODIUM: 154 mmol/L — AB (ref 135–145)
Sodium: 147 mmol/L — ABNORMAL HIGH (ref 135–145)
TOTAL PROTEIN: 4.1 g/dL — AB (ref 6.5–8.1)
Total Bilirubin: 0.6 mg/dL (ref 0.3–1.2)
Total Protein: 5.6 g/dL — ABNORMAL LOW (ref 6.5–8.1)

## 2015-11-17 LAB — URINE MICROSCOPIC-ADD ON

## 2015-11-17 LAB — POCT I-STAT 3, ART BLOOD GAS (G3+)
Acid-Base Excess: 8 mmol/L — ABNORMAL HIGH (ref 0.0–2.0)
BICARBONATE: 33.5 meq/L — AB (ref 20.0–24.0)
O2 SAT: 98 %
PCO2 ART: 48.6 mmHg — AB (ref 35.0–45.0)
PO2 ART: 102 mmHg — AB (ref 80.0–100.0)
Patient temperature: 98.7
TCO2: 35 mmol/L (ref 0–100)
pH, Arterial: 7.447 (ref 7.350–7.450)

## 2015-11-17 LAB — PROTIME-INR
INR: 1.29 (ref 0.00–1.49)
INR: 1.45 (ref 0.00–1.49)
PROTHROMBIN TIME: 16.2 s — AB (ref 11.6–15.2)
PROTHROMBIN TIME: 17.7 s — AB (ref 11.6–15.2)

## 2015-11-17 LAB — I-STAT CHEM 8, ED
BUN: 59 mg/dL — ABNORMAL HIGH (ref 6–20)
CHLORIDE: 97 mmol/L — AB (ref 101–111)
Calcium, Ion: 0.84 mmol/L — ABNORMAL LOW (ref 1.13–1.30)
Creatinine, Ser: 6.7 mg/dL — ABNORMAL HIGH (ref 0.61–1.24)
GLUCOSE: 131 mg/dL — AB (ref 65–99)
HCT: 47 % (ref 39.0–52.0)
HEMOGLOBIN: 16 g/dL (ref 13.0–17.0)
POTASSIUM: 3.3 mmol/L — AB (ref 3.5–5.1)
SODIUM: 145 mmol/L (ref 135–145)
TCO2: 29 mmol/L (ref 0–100)

## 2015-11-17 LAB — CBC WITH DIFFERENTIAL/PLATELET
BASOS PCT: 1 %
Basophils Absolute: 0.1 10*3/uL (ref 0.0–0.1)
EOS PCT: 0 %
Eosinophils Absolute: 0 10*3/uL (ref 0.0–0.7)
HEMATOCRIT: 47.7 % (ref 39.0–52.0)
Hemoglobin: 15.8 g/dL (ref 13.0–17.0)
LYMPHS PCT: 13 %
Lymphs Abs: 1.4 10*3/uL (ref 0.7–4.0)
MCH: 30.9 pg (ref 26.0–34.0)
MCHC: 33.1 g/dL (ref 30.0–36.0)
MCV: 93.2 fL (ref 78.0–100.0)
Monocytes Absolute: 1 10*3/uL (ref 0.1–1.0)
Monocytes Relative: 9 %
NEUTROS ABS: 8.6 10*3/uL — AB (ref 1.7–7.7)
NEUTROS PCT: 77 %
Platelets: 338 10*3/uL (ref 150–400)
RBC: 5.12 MIL/uL (ref 4.22–5.81)
RDW: 14 % (ref 11.5–15.5)
WBC: 11.1 10*3/uL — ABNORMAL HIGH (ref 4.0–10.5)

## 2015-11-17 LAB — APTT: aPTT: 25 seconds (ref 24–37)

## 2015-11-17 LAB — URINALYSIS, ROUTINE W REFLEX MICROSCOPIC
GLUCOSE, UA: NEGATIVE mg/dL
KETONES UR: 15 mg/dL — AB
NITRITE: POSITIVE — AB
PH: 5 (ref 5.0–8.0)
Protein, ur: 100 mg/dL — AB
SPECIFIC GRAVITY, URINE: 1.022 (ref 1.005–1.030)

## 2015-11-17 LAB — TYPE AND SCREEN
ABO/RH(D): A POS
ANTIBODY SCREEN: NEGATIVE

## 2015-11-17 LAB — GLUCOSE, CAPILLARY
GLUCOSE-CAPILLARY: 103 mg/dL — AB (ref 65–99)
Glucose-Capillary: 81 mg/dL (ref 65–99)

## 2015-11-17 LAB — OCCULT BLOOD GASTRIC / DUODENUM (SPECIMEN CUP): Occult Blood, Gastric: POSITIVE — AB

## 2015-11-17 LAB — I-STAT CG4 LACTIC ACID, ED: Lactic Acid, Venous: 12.02 mmol/L (ref 0.5–2.0)

## 2015-11-17 LAB — TRIGLYCERIDES: TRIGLYCERIDES: 164 mg/dL — AB (ref <150)

## 2015-11-17 LAB — LIPASE, BLOOD: Lipase: 13 U/L (ref 11–51)

## 2015-11-17 LAB — MAGNESIUM: Magnesium: 2.3 mg/dL (ref 1.7–2.4)

## 2015-11-17 LAB — CARBOXYHEMOGLOBIN
CARBOXYHEMOGLOBIN: 0.4 % — AB (ref 0.5–1.5)
METHEMOGLOBIN: 1.5 % (ref 0.0–1.5)
O2 Saturation: 62.5 %
TOTAL HEMOGLOBIN: 12.9 g/dL — AB (ref 13.5–18.0)

## 2015-11-17 LAB — LACTIC ACID, PLASMA: Lactic Acid, Venous: 2.7 mmol/L (ref 0.5–2.0)

## 2015-11-17 LAB — MRSA PCR SCREENING: MRSA BY PCR: NEGATIVE

## 2015-11-17 LAB — TROPONIN I: Troponin I: 0.1 ng/mL — ABNORMAL HIGH (ref <0.031)

## 2015-11-17 LAB — PHOSPHORUS: PHOSPHORUS: 4.5 mg/dL (ref 2.5–4.6)

## 2015-11-17 LAB — POC OCCULT BLOOD, ED: Fecal Occult Bld: POSITIVE — AB

## 2015-11-17 SURGERY — LAPAROTOMY, EXPLORATORY
Anesthesia: General | Site: Abdomen

## 2015-11-17 MED ORDER — PIPERACILLIN-TAZOBACTAM IN DEX 2-0.25 GM/50ML IV SOLN
2.2500 g | Freq: Three times a day (TID) | INTRAVENOUS | Status: DC
  Administered 2015-11-17 – 2015-11-21 (×10): 2.25 g via INTRAVENOUS
  Filled 2015-11-17 (×13): qty 50

## 2015-11-17 MED ORDER — FENTANYL CITRATE (PF) 250 MCG/5ML IJ SOLN
INTRAMUSCULAR | Status: AC
  Filled 2015-11-17: qty 5

## 2015-11-17 MED ORDER — NEOSTIGMINE METHYLSULFATE 10 MG/10ML IV SOLN
INTRAVENOUS | Status: AC
  Filled 2015-11-17: qty 1

## 2015-11-17 MED ORDER — DEXTROSE 5 % IV SOLN
INTRAVENOUS | Status: DC
Start: 2015-11-17 — End: 2015-11-17
  Filled 2015-11-17 (×3): qty 50

## 2015-11-17 MED ORDER — FENTANYL CITRATE (PF) 100 MCG/2ML IJ SOLN
50.0000 ug | Freq: Once | INTRAMUSCULAR | Status: DC
  Filled 2015-11-17: qty 2

## 2015-11-17 MED ORDER — ANIDULAFUNGIN 100 MG IV SOLR
200.0000 mg | Freq: Once | INTRAVENOUS | Status: AC
  Administered 2015-11-17: 200 mg via INTRAVENOUS
  Filled 2015-11-17: qty 200

## 2015-11-17 MED ORDER — SODIUM CHLORIDE 0.9 % IV SOLN
INTRAVENOUS | Status: DC
  Administered 2015-11-17 (×2): via INTRAVENOUS

## 2015-11-17 MED ORDER — PROPOFOL 500 MG/50ML IV EMUL
INTRAVENOUS | Status: DC | PRN
  Administered 2015-11-17: 25 ug/kg/min via INTRAVENOUS

## 2015-11-17 MED ORDER — SODIUM CHLORIDE 0.9 % IV BOLUS (SEPSIS)
1000.0000 mL | Freq: Once | INTRAVENOUS | Status: AC
  Administered 2015-11-17: 1000 mL via INTRAVENOUS

## 2015-11-17 MED ORDER — INSULIN ASPART 100 UNIT/ML ~~LOC~~ SOLN
0.0000 [IU] | SUBCUTANEOUS | Status: DC
  Administered 2015-11-17 – 2015-11-26 (×4): 1 [IU] via SUBCUTANEOUS

## 2015-11-17 MED ORDER — ONDANSETRON HCL 4 MG/2ML IJ SOLN
4.0000 mg | Freq: Once | INTRAMUSCULAR | Status: AC
  Administered 2015-11-17: 4 mg via INTRAVENOUS
  Filled 2015-11-17: qty 2

## 2015-11-17 MED ORDER — ROCURONIUM BROMIDE 50 MG/5ML IV SOLN
INTRAVENOUS | Status: AC
  Filled 2015-11-17: qty 2

## 2015-11-17 MED ORDER — SODIUM CHLORIDE 0.9 % IV SOLN
80.0000 mg | Freq: Once | INTRAVENOUS | Status: AC
  Administered 2015-11-17: 80 mg via INTRAVENOUS
  Filled 2015-11-17: qty 80

## 2015-11-17 MED ORDER — ANTISEPTIC ORAL RINSE SOLUTION (CORINZ)
7.0000 mL | Freq: Four times a day (QID) | OROMUCOSAL | Status: DC
  Administered 2015-11-17 – 2015-11-24 (×24): 7 mL via OROMUCOSAL

## 2015-11-17 MED ORDER — ONDANSETRON HCL 4 MG/2ML IJ SOLN
INTRAMUSCULAR | Status: AC
  Filled 2015-11-17: qty 2

## 2015-11-17 MED ORDER — SUCCINYLCHOLINE CHLORIDE 20 MG/ML IJ SOLN
INTRAMUSCULAR | Status: DC | PRN
  Administered 2015-11-17: 100 mg via INTRAVENOUS

## 2015-11-17 MED ORDER — GLYCOPYRROLATE 0.2 MG/ML IJ SOLN
INTRAMUSCULAR | Status: AC
  Filled 2015-11-17: qty 3

## 2015-11-17 MED ORDER — PANTOPRAZOLE SODIUM 40 MG IV SOLR
40.0000 mg | Freq: Every day | INTRAVENOUS | Status: DC
  Administered 2015-11-17 – 2015-11-26 (×10): 40 mg via INTRAVENOUS
  Filled 2015-11-17 (×10): qty 40

## 2015-11-17 MED ORDER — ONDANSETRON HCL 4 MG/2ML IJ SOLN: 4.0000 mg | Freq: Four times a day (QID) | INTRAMUSCULAR | Status: DC | PRN

## 2015-11-17 MED ORDER — FENTANYL BOLUS VIA INFUSION
25.0000 ug | INTRAVENOUS | Status: DC | PRN
  Filled 2015-11-17: qty 25

## 2015-11-17 MED ORDER — PHENYLEPHRINE 40 MCG/ML (10ML) SYRINGE FOR IV PUSH (FOR BLOOD PRESSURE SUPPORT)
PREFILLED_SYRINGE | INTRAVENOUS | Status: AC
  Filled 2015-11-17: qty 10

## 2015-11-17 MED ORDER — CHLORHEXIDINE GLUCONATE 0.12% ORAL RINSE (MEDLINE KIT)
15.0000 mL | Freq: Two times a day (BID) | OROMUCOSAL | Status: DC
  Administered 2015-11-17 – 2015-11-24 (×13): 15 mL via OROMUCOSAL

## 2015-11-17 MED ORDER — ROCURONIUM BROMIDE 50 MG/5ML IV SOLN
INTRAVENOUS | Status: AC
  Filled 2015-11-17: qty 1

## 2015-11-17 MED ORDER — PROPOFOL 1000 MG/100ML IV EMUL
0.0000 ug/kg/min | INTRAVENOUS | Status: DC
  Administered 2015-11-18: 8 ug/kg/min via INTRAVENOUS
  Administered 2015-11-18: 25.054 ug/kg/min via INTRAVENOUS
  Filled 2015-11-17 (×2): qty 100

## 2015-11-17 MED ORDER — ONDANSETRON 4 MG PO TBDP
4.0000 mg | ORAL_TABLET | Freq: Four times a day (QID) | ORAL | Status: DC | PRN
  Filled 2015-11-17: qty 1

## 2015-11-17 MED ORDER — 0.9 % SODIUM CHLORIDE (POUR BTL) OPTIME
TOPICAL | Status: DC | PRN
  Administered 2015-11-17 (×2): 1000 mL

## 2015-11-17 MED ORDER — PHENYLEPHRINE HCL 10 MG/ML IJ SOLN
10.0000 mg | INTRAVENOUS | Status: DC | PRN
  Administered 2015-11-17: 25 ug/min via INTRAVENOUS

## 2015-11-17 MED ORDER — PIPERACILLIN-TAZOBACTAM 4.5 G IVPB
4.5000 g | Freq: Once | INTRAVENOUS | Status: DC
  Filled 2015-11-17: qty 100

## 2015-11-17 MED ORDER — SODIUM CHLORIDE 0.9 % IV SOLN
8.0000 mg/h | INTRAVENOUS | Status: DC
  Administered 2015-11-17: 8 mg/h via INTRAVENOUS
  Filled 2015-11-17 (×3): qty 80

## 2015-11-17 MED ORDER — PROPOFOL 10 MG/ML IV BOLUS
INTRAVENOUS | Status: AC
  Filled 2015-11-17: qty 20

## 2015-11-17 MED ORDER — FENTANYL CITRATE (PF) 100 MCG/2ML IJ SOLN
50.0000 ug | Freq: Once | INTRAMUSCULAR | Status: AC
  Administered 2015-11-23: 50 ug via INTRAVENOUS

## 2015-11-17 MED ORDER — EPHEDRINE SULFATE 50 MG/ML IJ SOLN
INTRAMUSCULAR | Status: AC
  Filled 2015-11-17: qty 1

## 2015-11-17 MED ORDER — SODIUM CHLORIDE 0.9 % IV BOLUS (SEPSIS): 1000.0000 mL | Freq: Once | INTRAVENOUS | Status: AC

## 2015-11-17 MED ORDER — SODIUM CHLORIDE 0.9 % IV SOLN
INTRAVENOUS | Status: DC
  Administered 2015-11-17 (×2): via INTRAVENOUS

## 2015-11-17 MED ORDER — SODIUM CHLORIDE 0.9 % IV BOLUS (SEPSIS)
2000.0000 mL | Freq: Once | INTRAVENOUS | Status: AC
  Administered 2015-11-17: 2000 mL via INTRAVENOUS

## 2015-11-17 MED ORDER — FENTANYL CITRATE (PF) 100 MCG/2ML IJ SOLN
INTRAMUSCULAR | Status: DC | PRN
  Administered 2015-11-17: 50 ug via INTRAVENOUS

## 2015-11-17 MED ORDER — LACTATED RINGERS IV SOLN
INTRAVENOUS | Status: DC | PRN
  Administered 2015-11-17 (×2): via INTRAVENOUS

## 2015-11-17 MED ORDER — STERILE WATER FOR INJECTION IJ SOLN
INTRAMUSCULAR | Status: AC
  Filled 2015-11-17: qty 10

## 2015-11-17 MED ORDER — SODIUM CHLORIDE 0.9 % IV SOLN
100.0000 mg | INTRAVENOUS | Status: AC
  Administered 2015-11-18 – 2015-11-27 (×10): 100 mg via INTRAVENOUS
  Filled 2015-11-17 (×11): qty 100

## 2015-11-17 MED ORDER — SODIUM CHLORIDE 0.9 % IV SOLN
25.0000 ug/h | INTRAVENOUS | Status: DC
  Filled 2015-11-17: qty 50

## 2015-11-17 MED ORDER — HEPARIN SODIUM (PORCINE) 5000 UNIT/ML IJ SOLN
5000.0000 [IU] | Freq: Three times a day (TID) | INTRAMUSCULAR | Status: DC
  Administered 2015-11-17 – 2015-11-18 (×2): 5000 [IU] via SUBCUTANEOUS
  Filled 2015-11-17 (×2): qty 1

## 2015-11-17 MED ORDER — SODIUM CHLORIDE 0.9 % IV SOLN
25.0000 ug/h | INTRAVENOUS | Status: DC
  Administered 2015-11-17 – 2015-11-19 (×3): 100 ug/h via INTRAVENOUS
  Administered 2015-11-20: 125 ug/h via INTRAVENOUS
  Administered 2015-11-21: 150 ug/h via INTRAVENOUS
  Administered 2015-11-22: 200 ug/h via INTRAVENOUS
  Administered 2015-11-22: 300 ug/h via INTRAVENOUS
  Administered 2015-11-23: 255 ug/h via INTRAVENOUS
  Filled 2015-11-17 (×26): qty 50

## 2015-11-17 MED ORDER — ETOMIDATE 2 MG/ML IV SOLN
INTRAVENOUS | Status: DC | PRN
  Administered 2015-11-17: 12 mg via INTRAVENOUS

## 2015-11-17 MED ORDER — LIDOCAINE HCL (CARDIAC) 20 MG/ML IV SOLN
INTRAVENOUS | Status: AC
  Filled 2015-11-17: qty 5

## 2015-11-17 MED ORDER — VANCOMYCIN HCL 10 G IV SOLR
1500.0000 mg | Freq: Once | INTRAVENOUS | Status: AC
  Administered 2015-11-17: 1500 mg via INTRAVENOUS
  Filled 2015-11-17: qty 1500

## 2015-11-17 MED ORDER — ROCURONIUM BROMIDE 100 MG/10ML IV SOLN
INTRAVENOUS | Status: DC | PRN
  Administered 2015-11-17: 20 mg via INTRAVENOUS

## 2015-11-17 MED ORDER — PIPERACILLIN-TAZOBACTAM 3.375 G IVPB
3.3750 g | INTRAVENOUS | Status: AC
  Administered 2015-11-17: 3.375 g via INTRAVENOUS
  Filled 2015-11-17: qty 50

## 2015-11-17 MED ORDER — NOREPINEPHRINE BITARTRATE 1 MG/ML IV SOLN
2.0000 ug/min | INTRAVENOUS | Status: DC
  Administered 2015-11-17: 4 ug/min via INTRAVENOUS
  Administered 2015-11-18: 20 ug/min via INTRAVENOUS
  Administered 2015-11-18: 17 ug/min via INTRAVENOUS
  Filled 2015-11-17 (×2): qty 4

## 2015-11-17 SURGICAL SUPPLY — 49 items
BIOPATCH RED 1 DISK 7.0 (GAUZE/BANDAGES/DRESSINGS) ×2 IMPLANT
BIOPATCH RED 1IN DISK 7.0MM (GAUZE/BANDAGES/DRESSINGS) ×1
BLADE SURG ROTATE 9660 (MISCELLANEOUS) ×3 IMPLANT
CANISTER SUCTION 2500CC (MISCELLANEOUS) ×3 IMPLANT
CANISTER WOUND CARE 500ML ATS (WOUND CARE) ×3 IMPLANT
CHLORAPREP W/TINT 26ML (MISCELLANEOUS) ×3 IMPLANT
COVER SURGICAL LIGHT HANDLE (MISCELLANEOUS) ×3 IMPLANT
DRAPE LAPAROSCOPIC ABDOMINAL (DRAPES) ×3 IMPLANT
DRAPE WARM FLUID 44X44 (DRAPE) ×3 IMPLANT
DRSG OPSITE POSTOP 4X10 (GAUZE/BANDAGES/DRESSINGS) IMPLANT
DRSG OPSITE POSTOP 4X8 (GAUZE/BANDAGES/DRESSINGS) IMPLANT
ELECT BLADE 6.5 EXT (BLADE) IMPLANT
ELECT CAUTERY BLADE 6.4 (BLADE) ×6 IMPLANT
ELECT REM PT RETURN 9FT ADLT (ELECTROSURGICAL) ×3
ELECTRODE REM PT RTRN 9FT ADLT (ELECTROSURGICAL) ×1 IMPLANT
GLOVE BIO SURGEON STRL SZ 6.5 (GLOVE) ×2 IMPLANT
GLOVE BIO SURGEON STRL SZ7 (GLOVE) ×3 IMPLANT
GLOVE BIO SURGEONS STRL SZ 6.5 (GLOVE) ×1
GLOVE BIOGEL PI IND STRL 6.5 (GLOVE) ×1 IMPLANT
GLOVE BIOGEL PI IND STRL 7.5 (GLOVE) ×1 IMPLANT
GLOVE BIOGEL PI INDICATOR 6.5 (GLOVE) ×2
GLOVE BIOGEL PI INDICATOR 7.5 (GLOVE) ×2
GOWN STRL REUS W/ TWL LRG LVL3 (GOWN DISPOSABLE) ×3 IMPLANT
GOWN STRL REUS W/TWL LRG LVL3 (GOWN DISPOSABLE) ×9
KIT BASIN OR (CUSTOM PROCEDURE TRAY) ×3 IMPLANT
KIT ROOM TURNOVER OR (KITS) ×3 IMPLANT
LIGASURE IMPACT 36 18CM CVD LR (INSTRUMENTS) IMPLANT
NS IRRIG 1000ML POUR BTL (IV SOLUTION) ×6 IMPLANT
PACK GENERAL/GYN (CUSTOM PROCEDURE TRAY) ×3 IMPLANT
PAD ARMBOARD 7.5X6 YLW CONV (MISCELLANEOUS) ×3 IMPLANT
SPECIMEN JAR LARGE (MISCELLANEOUS) IMPLANT
SPONGE ABDOMINAL VAC ABTHERA (MISCELLANEOUS) ×3 IMPLANT
SPONGE LAP 18X18 X RAY DECT (DISPOSABLE) IMPLANT
STAPLER VISISTAT 35W (STAPLE) ×3 IMPLANT
SUCTION POOLE TIP (SUCTIONS) ×6 IMPLANT
SUT PDS AB 1 TP1 96 (SUTURE) ×6 IMPLANT
SUT SILK 2 0 (SUTURE) ×3
SUT SILK 2 0 SH CR/8 (SUTURE) IMPLANT
SUT SILK 2 0 TIES 10X30 (SUTURE) ×3 IMPLANT
SUT SILK 2-0 18XBRD TIE 12 (SUTURE) IMPLANT
SUT SILK 3 0 (SUTURE) ×3
SUT SILK 3 0 SH CR/8 (SUTURE) ×3 IMPLANT
SUT SILK 3 0 TIES 10X30 (SUTURE) ×3 IMPLANT
SUT SILK 3-0 18XBRD TIE 12 (SUTURE) ×1 IMPLANT
SUT VIC AB 3-0 SH 18 (SUTURE) IMPLANT
TOWEL NATURAL 6PK STERILE (DISPOSABLE) ×2 IMPLANT
TOWEL OR 17X26 10 PK STRL BLUE (TOWEL DISPOSABLE) ×3 IMPLANT
TRAY FOLEY CATH 16FRSI W/METER (SET/KITS/TRAYS/PACK) IMPLANT
YANKAUER SUCT BULB TIP NO VENT (SUCTIONS) IMPLANT

## 2015-11-17 NOTE — Anesthesia Preprocedure Evaluation (Addendum)
Anesthesia Evaluation  Patient identified by MRN, date of birth, ID band Patient awake    Reviewed: Allergy & Precautions, H&P , Patient's Chart, lab work & pertinent test results, reviewed documented beta blocker date and time   History of Anesthesia Complications Negative for: history of anesthetic complications  Airway Mallampati: II  TM Distance: >3 FB Neck ROM: full    Dental no notable dental hx.    Pulmonary neg pulmonary ROS,    Pulmonary exam normal breath sounds clear to auscultation       Cardiovascular Exercise Tolerance: Good negative cardio ROS   Rhythm:regular Rate:Normal     Neuro/Psych PSYCHIATRIC DISORDERS Anxiety Depression Bipolar Disorder negative neurological ROS     GI/Hepatic negative GI ROS, Neg liver ROS,   Endo/Other  negative endocrine ROS  Renal/GU negative Renal ROS     Musculoskeletal  (+) Arthritis ,   Abdominal   Peds  Hematology negative hematology ROS (+) anemia ,   Anesthesia Other Findings Osteoarthritis X years left knee.  Distant hx (age 65) "dislocated" knee and required surgery, then crush injury to patella required knee cap removal. Diverticular disease   "itis" x 2 episodes (last was about 2007)    Tobacco dependence     Multiple rib fractures 10/09/10 s/p fall down a ravine--9th and 10th on right, contusion of left 7th and 8th ribs    Fracture of metatarsal bone(s), closed   3rd, 4th, 5th mid shaft on left foot History of substance abuse   cocaine, marijuana, acid, alcohol (in remission since 2010)    Solar dermatitis   face and ears Bipolar disorder   Has had multiple psych hospitalization in the past, most recently at [facility] 01/31/11-02/04/11.  Says meds made him emotionally blunted. (lamictal and wellbutrin).    Alcoholism   "Dry" since 2002 Depression    Reproductive/Obstetrics negative OB ROS                             Anesthesia  Physical  Anesthesia Plan  ASA: III and emergent  Anesthesia Plan: General   Post-op Pain Management:    Induction: Intravenous  Airway Management Planned: Oral ETT  Additional Equipment: Arterial line, CVP and Ultrasound Guidance Line Placement  Intra-op Plan:   Post-operative Plan: Post-operative intubation/ventilation  Informed Consent: I have reviewed the patients History and Physical, chart, labs and discussed the procedure including the risks, benefits and alternatives for the proposed anesthesia with the patient or authorized representative who has indicated his/her understanding and acceptance.   Dental advisory given  Plan Discussed with: CRNA  Anesthesia Plan Comments:        Anesthesia Quick Evaluation

## 2015-11-17 NOTE — ED Notes (Signed)
Shawn [name] gives permission to speak with his son Shawn [name]. Who goes by [name], pts family given update, pts son wants to be called at [phone]

## 2015-11-17 NOTE — ED Notes (Signed)
Pt arrived from home via [facility] with complaints of hematemesis x 2 days. Per EMS report, pt had 2-3L of dark red blood vomit beside him at their arrival, as well as an initial BP of 56/37. Received 800 ml's of NS in ems. Pt a&ox4. Pt denies blood in stool. Pt soiled with loose stool at arrival to room, actively vomitting.

## 2015-11-17 NOTE — Anesthesia Procedure Notes (Signed)
Central Venous Catheter Insertion Performed by: anesthesiologist Patient location: Pre-op. Preanesthetic checklist: patient identified, IV checked, site marked, risks and benefits discussed, surgical consent, monitors and equipment checked, pre-op evaluation, timeout performed and anesthesia consent Lidocaine 1% used for infiltration Landmarks identified Catheter size: 8 Fr Central line was placed.Double lumen Procedure performed using ultrasound guided technique. Attempts: 1 Following insertion, dressing applied, line sutured and Biopatch. Post procedure assessment: blood return through all ports, free fluid flow and no air. Patient tolerated the procedure well with no immediate complications.

## 2015-11-17 NOTE — Consult Note (Signed)
PULMONARY / CRITICAL CARE MEDICINE   Name: Shawn [name] MRN: [id number] DOB: 11-05-1950    ADMISSION DATE:  11/17/2015 CONSULTATION DATE:  003/17/2017  REFERRING MD:  Dr. Redmond Pulling with [facility]  CHIEF COMPLAINT:  Postoperative ventilator management, sepsis management  HISTORY OF PRESENT ILLNESS:   This is a 65 year old Male presented to the emergency department on 11/17/2015 complaining of 3 days of abdominal pain with nausea vomiting and constipation. This is associated apparently with fevers and chills. The patient was intubated and sedated on our assessment and cannot provide a history so history was obtained by chart review. CT scan was worrisome for dead gut and so he was taken to the operating room. In the operating room he was found to have viable gut that some pneumatosis. He was left open and taken to the MICU for further care.  PAST MEDICAL HISTORY :  He  has a past medical history of Osteoarthritis (X years); Diverticular disease; Tobacco dependence; Multiple rib fractures (10/09/10); Fracture of metatarsal bone(s), closed; History of substance abuse; Solar dermatitis; Bipolar disorder; Alcoholism; Depression; Anxiety; and Vitamin B12 deficiency (10/2013).  PAST SURGICAL HISTORY: He  has past surgical history that includes Knee dislocation surgery (1966); Patellectomy (1968); Inguinal hernia repair; Hernia repair; Total knee arthroplasty (11/20/2011); Inguinal hernia repair (06/15/2012); Bowel resection (06/15/2012); and Femur IM nail (08/06/2012).  Allergies  Allergen Reactions  . Codeine Nausea Only    flushing  . Depakote [Divalproex Sodium] Other (See Comments)    Makes me crazy    No current facility-administered medications on file prior to encounter.   Current Outpatient Prescriptions on File Prior to Encounter  Medication Sig  . hydroxypropyl methylcellulose (ISOPTO TEARS) 2.5 % ophthalmic solution Place 2 drops into both eyes as needed for dry eyes.     FAMILY HISTORY/SOCIAL HISTORY/REVIEW OF SYSTEMS:   Cannot obtain because he was intubated  SUBJECTIVE:  As above  VITAL SIGNS: BP 89/71 mmHg  Pulse 29  Temp(Src) 96.9 F (36.1 C) (Rectal)  Resp 21  SpO2 84%  HEMODYNAMICS:    VENTILATOR SETTINGS:    INTAKE / OUTPUT:    PHYSICAL EXAMINATION: General:  Thin man in NAD Neuro:  Sedated, awakens to pain. HEENT:  ETT, OGT in place. Normal secretions. Cardiovascular:  Tachycardic, but normal heart sounds Lungs:  Normal mechanical breath sounds. Abdomen:  Wound vac-in place, pt winces to minimal palpation. Musculoskeletal:  No swollen joints. Skin:  No rashes on anterior body.  LABS:  BMET  Recent Labs Lab 11/17/15 1400 11/17/15 1418  NA 154* 145  K 3.7 3.3*  CL 95* 97*  CO2 27  --   BUN 58* 59*  CREATININE 7.47* 6.70*  GLUCOSE 139* 131*    Electrolytes  Recent Labs Lab 11/17/15 1400  CALCIUM 8.1*    CBC  Recent Labs Lab 11/17/15 1400 11/17/15 1418  WBC 11.1*  --   HGB 15.8 16.0  HCT 47.7 47.0  PLT 338  --     Coag's  Recent Labs Lab 11/17/15 1400  APTT 25  INR 1.29    Sepsis Markers  Recent Labs Lab 11/17/15 1419  LATICACIDVEN 12.02*    ABG No results for input(s): PHART, PCO2ART, PO2ART in the last 168 hours.  Liver Enzymes  Recent Labs Lab 11/17/15 1400  AST 39  ALT 13*  ALKPHOS 106  BILITOT 0.6  ALBUMIN 2.8*    Cardiac Enzymes  Recent Labs Lab 11/17/15 1400  TROPONINI 0.10*    Glucose No results  for input(s): GLUCAP in the last 168 hours.  Imaging Ct Abdomen Pelvis Wo Contrast  11/17/2015  CLINICAL DATA:  Acute abdominal pain, nausea and vomiting for 2 days. Elevated lactate. EXAM: CT ABDOMEN AND PELVIS WITHOUT CONTRAST TECHNIQUE: Multidetector CT imaging of the abdomen and pelvis was performed following the standard protocol without IV contrast. COMPARISON:  12/08/2013 FINDINGS: Lower chest:  Minor bibasilar atelectasis versus scarring. Hepatobiliary:  Scattered areas of portal venous gas throughout the right and left hepatic lobes. No biliary dilatation. Incidental gallstones layer in the gallbladder, image 39. Pancreas: Diffusely thin atrophic. No ductal dilatation or focal abnormality by noncontrast imaging. Spleen: Within normal limits in size. Adrenals/Urinary Tract: Normal adrenal glands. No hydronephrosis or obstructing ureteral calculus on either side. Ureters are decompressed and symmetric. Left kidney demonstrates a hypodense 2 cm renal cyst in the lower pole medially, image 47. Upper pole left renal cyst anteriorly measures 10 mm, image 34. Stomach/Bowel: Stomach is moderately distended. Beginning with the fourth portion of the duodenum and involving several dilated loops of jejunum there is extensive small bowel pneumatosis throughout the entire abdomen. There is some sparing of the distal ileum. There is diffuse edema throughout the central mesentery and scattered foci of central mesenteric venous gas noted throughout the mesenteric veins. Findings compatible with small bowel ischemia and associated ileus. Colon is relatively decompressed but contains air. Minor scattered colonic diverticulosis most pronounced in the sigmoid. No fluid collection or abscess. No definite pneumoperitoneum. Vascular/Lymphatic: Extensive atherosclerosis of the aorta and abdominal vasculature. No aneurysm or retroperitoneal hemorrhage. No significant adenopathy. Reproductive: No mass or other significant abnormality. Other: Right hip fixation hardware creates artifact. No inguinal abnormality or hernia. Small fat containing umbilical hernia evident. Postop changes from inguinal hernia repairs without recurrence. Musculoskeletal: Bones are osteopenic. Degenerative changes of the spine. No acute osseous finding. IMPRESSION: Diffuse extensive small bowel pneumatosis with mild dilatation throughout the abdomen and pelvis involving the distal duodenum and the jejunum with some  sparing of the ileum compatible with small bowel ischemia. Diffuse central mesenteric and portal venous gas with central mesenteric edema/congestion. Associated diffuse small bowel ileus Extensive abdominal and aortic atherosclerosis No significant pneumoperitoneum or abscess Cholelithiasis Colonic diverticulosis These results were called by telephone at the time of interpretation on 11/17/2015 at 3:15 pm to Dr. Ezequiel Essex , who verbally acknowledged these results. Electronically Signed   By: Jerilynn Mages.  Shick M.D.   On: 11/17/2015 15:19   Dg Chest Portable 1 View  11/17/2015  CLINICAL DATA:  Shortness of breath, decreased blood pressure in O2 sats. Cough. Aspiration? EXAM: PORTABLE CHEST 1 VIEW COMPARISON:  Chest x-ray dated 12/08/2013. FINDINGS: Cardiomediastinal silhouette is stable in size and configuration. Probable mild scarring at the right lung base. Lungs otherwise clear. No pleural effusion or pneumothorax seen. Osseous and soft tissue structures about the chest are unremarkable. IMPRESSION: No active disease. Electronically Signed   By: Franki Cabot M.D.   On: 11/17/2015 14:49   Dg Abd Portable 1v  11/17/2015  CLINICAL DATA:  Severe lower abdominal pain.  GI bleed. EXAM: PORTABLE ABDOMEN - 1 VIEW COMPARISON:  12/08/2013 CT abdomen/pelvis. FINDINGS: There is prominent pneumatosis involving mildly dilated small bowel loops throughout the abdomen. Surgical sutures are noted in the left mid abdomen. No gross pneumoperitoneum on this supine image. Moderate stool in the rectum. Partially visualized left proximal femur hardware. IMPRESSION: Severe pneumatosis involving mildly dilated small bowel loops throughout the abdomen. Findings are worrisome for diffuse small bowel ischemia. Recommend correlation with  CT abdomen/pelvis with IV contrast. These results were called by telephone at the time of interpretation on 11/17/2015 at 2:48 pm to Dr. Ezequiel Essex , who verbally acknowledged these results.  Electronically Signed   By: Ilona Sorrel M.D.   On: 11/17/2015 14:50     STUDIES:  11/17/2015 CT abdomen and pelvis: Diffuse extensive small bowel pneumatosis with mild dilation throughout the abdomen and pelvisconsistent with small bowel ischemia. Diffuse central Mesentericand portal venous small gas, Associated diffuse small bowel ileus, extensive abdominal and aortic atherosclerosis, no significant pneumoperitoneum or abscess, cholelithiasis, colonic diverticulosis  CULTURES: 3/17 Blood >  ANTIBIOTICS: March 17 Zosyn>   SIGNIFICANT EVENTS: 11/17/2015 admission, exploratory laparotomy  LINES/TUBES: 11/17/2015 central venous line > 11/17/2015 arterial line > 11/17/2015 endotracheal tube >  DISCUSSION: This is a 65 year old male with a past medical history significant forTobacco abuse, bipolar disorder and alcoholism who [facility] department on 11/17/2015 with severe sepsis in the setting of diffuse abdominal pneumatosis but no evidence of dead bowel on exploratory laparotomy. Problems include acute respiratory failure with hypoxemia and acute kidney injury. The exact cause of the pneumatosis is uncertain but likely is the underlying cause of his sepsis.  ASSESSMENT / PLAN:  PULMONARY A: Acute respiratory failure with hypoxemia P:   Full ventilatory support Chest x-ray for tube placement ABG At this time there is no plan for extubation as his abdomen will be left open  CARDIOVASCULAR A:  Septic shock, status post 40 mL per Kg, still w/ CVP of 5 P:  Bolus another 30 mL/kg. Change Neo-Synephrine to Levophed, Titrate to mean arterial pressure greater than 65 Telemetry monitoring CVP monitoring Arterial line for blood pressure management Follow lactic acid  RENAL A:   Acute kidney injury P:   Suspect ATN Foley catheter Aggressive IV fluid resuscitation Repeat BMET later this evening, may need dialysis  GASTROINTESTINAL A:   Small bowel  pneumatosis, yet viable etiology uncertain Question upper GI bleed P:   Continue PPI infusion Postoperative management per surgery, wound to be left open for now NPO  HEMATOLOGIC A:   No acute issues P:  Monitor for bleeding  INFECTIOUS A:   Septic shock secondary to peritonitis P:   Order blood cultures Continue Zosyn Add Anidulofungin and vancomycin  ENDOCRINE A:   Hyperglycemia   P:   Sliding-scale every 4  Hours with Accu-Cheks  NEUROLOGIC A:   Post op pain Sedation needs for ventilator synchrony P:   RASS goal: -1 Fentanyl drip   FAMILY  - Updates: *Unable to contact son, message left to return call to unit.. - Inter-disciplinary family meet or Palliative Care meeting due by:  3/24  CRITICAL CARE Performed by: Luz Brazen   Total critical care time: 30 minutes  Critical care time was exclusive of separately billable procedures and treating other patients.  Critical care was necessary to treat or prevent imminent or life-threatening deterioration.  Critical care was time spent personally by me on the following activities: development of treatment plan with patient and/or surrogate as well as nursing, discussions with consultants, evaluation of patient's response to treatment, examination of patient, obtaining history from patient or surrogate, ordering and performing treatments and interventions, ordering and review of laboratory studies, ordering and review of radiographic studies, pulse oximetry and re-evaluation of patient's condition.   Luz Brazen, MD Pulmonary & Critical Care Medicine November 17, 2015, 9:07 PM
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
## 2015-11-17 NOTE — Progress Notes (Signed)
[facility] Progress Note Patient Name: Shawn [name] DOB: May 30, 1951 MRN: [id number]   Date of Service  11/17/2015  HPI/Events of Note  Consult from [facility]: pneumotosis, shock, went to OR and there was no dead gut seen, left open  eICU Interventions  Will place vent/sedation orders     Intervention Category Major Interventions: Respiratory failure - evaluation and management  Simonne Maffucci 11/17/2015, 5:40 PM

## 2015-11-17 NOTE — ED Notes (Signed)
Pt unable to provide phone number for friend or family to call, pt denies the phone number listed as an accurate number

## 2015-11-17 NOTE — Op Note (Addendum)
Preop diagnosis: Metabolic acidosis with small bowel pneumatosis-presumed ischemic bowel Postop diagnosis: Metabolic acidosis with small bowel pneumatosis no ischemic bowel Procedure:  Exploratory laparotomy Surgeon:  Gonsalo Cuthbertson K.  Assistant:  Dr. Greer Pickerel Anesthesia:  GEN Indications for surgery:  This is a 65 year old male who presents with a three-day history of worsening abdominal pain with nausea vomiting and constipation. He has also has fever. His abdomen is very tender. He was noted to be in metabolic acidosis with lactic acid of 12. His renal function has deteriorated with a serum creatinine of 6.7. CT scan showed extensive small bowel pneumatosis with mesenteric and portal venous gas. The patient is brought emergently to the operating room for exploratory laparotomy for presumed ischemic small bowel.  Description of procedure: The patient brought to the operating room placed in supine position on the operating table. After adequate level of general anesthesia was obtained, a Foley catheter was placed under sterile technique. A central line and arterial line were placed by anesthesia. His abdomen was prepped with ChloraPrep and draped sterile fashion. A timeout was taken to ensure the proper patient and proper procedure. We made a vertical midline incision. Dissection was carried down to the linea alba. We into the peritoneal cavity. There is free fluid within the abdomen. We examined the small bowel. The small bowel is mildly thickened but appears to be pink and viable. There are no areas of ischemic necrosis. We identified the ligament of Treitz and ran the small bowel entirely to the ileocecal valve. The patient has a previous small bowel anastomosis. The anterior wall this anastomosis shows a 3 cm area of purplish discoloration. However there does not seem to be any full-thickness necrosis. We examined the entire colon and it appears normal. Liver and gallbladder as well as stomach also  appear normal. A nasogastric tube was placed into the stomach. We made the decision to leave the patient's abdomen open to come back for second look and about 48 hours. We used the Ab-Thera negative pressure abdominal dressing. The sponge was placed into the abdomen. We used a small sponge in the subcutaneous tissue. The dressing was sealed with an occlusive drape.  This was then placed to 125 mm suction. The patient was then transported to the intensive care unit in critical condition.  Operative findings: There is no evidence of necrotic or severely ischemic bowel. The findings seen are consistent with a low flow state but there are no areas that require resection.  Imogene Burn. Georgette Dover, MD, [facility] Surgery  General/ Trauma Surgery  11/17/2015 5:41 PM

## 2015-11-17 NOTE — Progress Notes (Signed)
Pharmacy Antibiotic Note  Shawn [name] is a 65 y.o. male admitted on 11/17/2015 with abdominal pain x 3 days associated with N/V fever and chills. Taken to OR for concern of ischemic or infarcted small bowel but none was found. Pharmacy consulted to start Vancomycin, Zosyn, and anidulafungin for concern for peritonitis/sepsis. WBC mildly elevated, hypothermic. LA 12.02. In acute renal failure with SCr at > 7 on admission despite baseline SCr < 1 in 2015.   Plan: -Start anidulafungin 200 mg IV once followed by 100 mg daily  -Give Vancomycin 1500 mg IV once. Check random level in ~ 48 hours if renal fx does not improve  -Zosyn 2.25 gm IV Q 8 hours      Temp (24hrs), Avg:96.9 F (36.1 C), Min:96.9 F (36.1 C), Max:96.9 F (36.1 C)   Recent Labs Lab 11/17/15 1400 11/17/15 1418 11/17/15 1419  WBC 11.1*  --   --   CREATININE 7.47* 6.70*  --   LATICACIDVEN  --   --  12.02*    CrCl cannot be calculated (Unknown ideal weight.).    Allergies  Allergen Reactions  . Codeine Nausea Only    flushing  . Depakote [Divalproex Sodium] Other (See Comments)    Makes me crazy    Antimicrobials this admission: 3/17 Vanc>> 3/17 Zosyn>> 3/17 Eraxis>>  Dose adjustments this admission: None   Microbiology results: 3/17 BCx2>>   Thank you for allowing pharmacy to be a part of this patient's care.  Albertina Parr, PharmD., BCPS Clinical Pharmacist Pager [phone]

## 2015-11-17 NOTE — Transfer of Care (Addendum)
Immediate Anesthesia Transfer of Care Note  Patient: Shawn [name]  Procedure(s) Performed: Procedure(s): EXPLORATORY LAPAROTOMY (N/A)  Patient Location: ICU  Anesthesia Type:General  Level of Consciousness: sedated and Patient remains intubated per anesthesia plan  Airway & Oxygen Therapy: Patient remains intubated per anesthesia plan and Patient placed on Ventilator (see vital sign flow sheet for setting)  Post-op Assessment: Report given to RN and Post -op Vital signs reviewed and stable - pt stable throughout transport as  Well as upon arrival to icu   Post vital signs: Reviewed and stable  Last Vitals:  Filed Vitals:   11/17/15 1515 11/17/15 1530  BP: 98/75 89/71  Pulse: 29   Temp:    Resp: 18 21    Complications: No apparent anesthesia complications

## 2015-11-17 NOTE — ED Notes (Signed)
[facility], NP at bedside
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
## 2015-11-18 ENCOUNTER — Inpatient Hospital Stay (HOSPITAL_COMMUNITY): Payer: Medicare Other

## 2015-11-18 DIAGNOSIS — A419 Sepsis, unspecified organism: Principal | ICD-10-CM

## 2015-11-18 DIAGNOSIS — L8990 Pressure ulcer of unspecified site, unspecified stage: Secondary | ICD-10-CM

## 2015-11-18 DIAGNOSIS — E440 Moderate protein-calorie malnutrition: Secondary | ICD-10-CM | POA: Insufficient documentation

## 2015-11-18 DIAGNOSIS — I442 Atrioventricular block, complete: Secondary | ICD-10-CM | POA: Diagnosis not present

## 2015-11-18 DIAGNOSIS — I5023 Acute on chronic systolic (congestive) heart failure: Secondary | ICD-10-CM

## 2015-11-18 DIAGNOSIS — J9601 Acute respiratory failure with hypoxia: Secondary | ICD-10-CM

## 2015-11-18 DIAGNOSIS — R6521 Severe sepsis with septic shock: Secondary | ICD-10-CM

## 2015-11-18 LAB — CBC WITH DIFFERENTIAL/PLATELET
BASOS ABS: 0 10*3/uL (ref 0.0–0.1)
Basophils Relative: 0 %
EOS ABS: 0.1 10*3/uL (ref 0.0–0.7)
Eosinophils Relative: 1 %
HCT: 38.4 % — ABNORMAL LOW (ref 39.0–52.0)
Hemoglobin: 12.9 g/dL — ABNORMAL LOW (ref 13.0–17.0)
LYMPHS ABS: 0.9 10*3/uL (ref 0.7–4.0)
Lymphocytes Relative: 11 %
MCH: 30.5 pg (ref 26.0–34.0)
MCHC: 33.6 g/dL (ref 30.0–36.0)
MCV: 90.8 fL (ref 78.0–100.0)
MONO ABS: 0.4 10*3/uL (ref 0.1–1.0)
Monocytes Relative: 5 %
Neutro Abs: 7 10*3/uL (ref 1.7–7.7)
Neutrophils Relative %: 83 %
PLATELETS: 287 10*3/uL (ref 150–400)
RBC: 4.23 MIL/uL (ref 4.22–5.81)
RDW: 14.1 % (ref 11.5–15.5)
WBC: 8.4 10*3/uL (ref 4.0–10.5)

## 2015-11-18 LAB — COMPREHENSIVE METABOLIC PANEL
ALT: 12 U/L — ABNORMAL LOW (ref 17–63)
AST: 28 U/L (ref 15–41)
Albumin: 1.8 g/dL — ABNORMAL LOW (ref 3.5–5.0)
Alkaline Phosphatase: 60 U/L (ref 38–126)
Anion gap: 14 (ref 5–15)
BILIRUBIN TOTAL: 0.9 mg/dL (ref 0.3–1.2)
BUN: 60 mg/dL — AB (ref 6–20)
CO2: 23 mmol/L (ref 22–32)
CREATININE: 5.96 mg/dL — AB (ref 0.61–1.24)
Calcium: 5.8 mg/dL — CL (ref 8.9–10.3)
Chloride: 108 mmol/L (ref 101–111)
GFR, EST AFRICAN AMERICAN: 10 mL/min — AB (ref >60)
GFR, EST NON AFRICAN AMERICAN: 9 mL/min — AB (ref >60)
Glucose, Bld: 112 mg/dL — ABNORMAL HIGH (ref 65–99)
POTASSIUM: 4.8 mmol/L (ref 3.5–5.1)
Sodium: 145 mmol/L (ref 135–145)
TOTAL PROTEIN: 4.1 g/dL — AB (ref 6.5–8.1)

## 2015-11-18 LAB — BLOOD GAS, ARTERIAL
ACID-BASE DEFICIT: 0.2 mmol/L (ref 0.0–2.0)
Bicarbonate: 23.9 mEq/L (ref 20.0–24.0)
Drawn by: 290171
FIO2: 0.4
MECHVT: 590 mL
O2 Saturation: 97.3 %
PATIENT TEMPERATURE: 98.6
PEEP/CPAP: 5 cmH2O
PH ART: 7.41 (ref 7.350–7.450)
PO2 ART: 131 mmHg — AB (ref 80.0–100.0)
RATE: 14 resp/min
TCO2: 25 mmol/L (ref 0–100)
pCO2 arterial: 38.4 mmHg (ref 35.0–45.0)

## 2015-11-18 LAB — POCT I-STAT 7, (LYTES, BLD GAS, ICA,H+H)
Acid-Base Excess: 5 mmol/L — ABNORMAL HIGH (ref 0.0–2.0)
BICARBONATE: 31.6 meq/L — AB (ref 20.0–24.0)
CALCIUM ION: 0.86 mmol/L — AB (ref 1.13–1.30)
HCT: 38 % — ABNORMAL LOW (ref 39.0–52.0)
Hemoglobin: 12.9 g/dL — ABNORMAL LOW (ref 13.0–17.0)
O2 Saturation: 100 %
PH ART: 7.372 (ref 7.350–7.450)
Patient temperature: 36
Potassium: 3.5 mmol/L (ref 3.5–5.1)
SODIUM: 145 mmol/L (ref 135–145)
TCO2: 33 mmol/L (ref 0–100)
pCO2 arterial: 53.9 mmHg — ABNORMAL HIGH (ref 35.0–45.0)
pO2, Arterial: 308 mmHg — ABNORMAL HIGH (ref 80.0–100.0)

## 2015-11-18 LAB — TROPONIN I
TROPONIN I: 1.23 ng/mL — AB (ref <0.031)
Troponin I: 1.18 ng/mL (ref <0.031)

## 2015-11-18 LAB — HEPARIN LEVEL (UNFRACTIONATED): HEPARIN UNFRACTIONATED: 0.1 [IU]/mL — AB (ref 0.30–0.70)

## 2015-11-18 LAB — GLUCOSE, CAPILLARY
GLUCOSE-CAPILLARY: 128 mg/dL — AB (ref 65–99)
GLUCOSE-CAPILLARY: 69 mg/dL (ref 65–99)
GLUCOSE-CAPILLARY: 79 mg/dL (ref 65–99)
Glucose-Capillary: 89 mg/dL (ref 65–99)
Glucose-Capillary: 88 mg/dL (ref 65–99)
Glucose-Capillary: 103 mg/dL — ABNORMAL HIGH (ref 65–99)

## 2015-11-18 LAB — PROTIME-INR
INR: 1.46 (ref 0.00–1.49)
PROTHROMBIN TIME: 17.8 s — AB (ref 11.6–15.2)

## 2015-11-18 LAB — LACTIC ACID, PLASMA: LACTIC ACID, VENOUS: 1.5 mmol/L (ref 0.5–2.0)

## 2015-11-18 LAB — PHOSPHORUS: PHOSPHORUS: 4.2 mg/dL (ref 2.5–4.6)

## 2015-11-18 LAB — ECHOCARDIOGRAM COMPLETE
HEIGHTINCHES: 70 in
Weight: 2698.43 oz

## 2015-11-18 LAB — MAGNESIUM: MAGNESIUM: 2.1 mg/dL (ref 1.7–2.4)

## 2015-11-18 LAB — APTT: aPTT: 31 seconds (ref 24–37)

## 2015-11-18 MED ORDER — DEXTROSE-NACL 5-0.45 % IV SOLN
INTRAVENOUS | Status: DC
  Administered 2015-11-18 – 2015-11-19 (×2): via INTRAVENOUS
  Administered 2015-11-20: 100 mL/h via INTRAVENOUS
  Administered 2015-11-21: 10:00:00 via INTRAVENOUS
  Administered 2015-11-22: 100 mL/h via INTRAVENOUS
  Administered 2015-11-24: 50 mL/h via INTRAVENOUS
  Administered 2015-11-27: 10 mL/h via INTRAVENOUS

## 2015-11-18 MED ORDER — ATROPINE SULFATE 0.1 MG/ML IJ SOLN
INTRAMUSCULAR | Status: AC
  Filled 2015-11-18: qty 10

## 2015-11-18 MED ORDER — SODIUM CHLORIDE 0.9 % IV SOLN
2.0000 g | Freq: Once | INTRAVENOUS | Status: AC
  Administered 2015-11-18: 2 g via INTRAVENOUS
  Filled 2015-11-18: qty 20

## 2015-11-18 MED ORDER — ACETAMINOPHEN 650 MG RE SUPP
650.0000 mg | Freq: Four times a day (QID) | RECTAL | Status: DC | PRN
Start: 2015-11-18 — End: 2015-11-28
  Administered 2015-11-18 – 2015-11-20 (×2): 650 mg via RECTAL
  Filled 2015-11-18 (×2): qty 1

## 2015-11-18 MED ORDER — HEPARIN (PORCINE) IN NACL 100-0.45 UNIT/ML-% IJ SOLN
1500.0000 [IU]/h | INTRAMUSCULAR | Status: DC
  Administered 2015-11-18: 900 [IU]/h via INTRAVENOUS
  Administered 2015-11-19: 1150 [IU]/h via INTRAVENOUS
  Administered 2015-11-20: 1300 [IU]/h via INTRAVENOUS
  Filled 2015-11-18 (×7): qty 250

## 2015-11-18 MED ORDER — SODIUM CHLORIDE 0.9 % IV SOLN: 2.0000 g | Freq: Once | INTRAVENOUS | Status: DC

## 2015-11-18 MED ORDER — DEXTROSE 5 % IV SOLN
2.0000 ug/min | INTRAVENOUS | Status: DC
  Administered 2015-11-18: 30 ug/min via INTRAVENOUS
  Administered 2015-11-18 – 2015-11-19 (×2): 25 ug/min via INTRAVENOUS
  Administered 2015-11-20: 12 ug/min via INTRAVENOUS
  Administered 2015-11-21: 14 ug/min via INTRAVENOUS
  Filled 2015-11-18 (×6): qty 16

## 2015-11-18 MED ORDER — SODIUM CHLORIDE 0.9 % IV SOLN: 1.0000 g | Freq: Once | INTRAVENOUS | Status: DC

## 2015-11-18 NOTE — Progress Notes (Addendum)
PULMONARY / CRITICAL CARE MEDICINE   Name: VIDIT BEAUBRUN MRN: [id number] DOB: [birth year]    ADMISSION DATE:  11/17/2015 CONSULTATION DATE:  003/17/2017  REFERRING MD:  Dr. Redmond Pulling with [facility]  CHIEF COMPLAINT:  Postoperative ventilator management, sepsis management  HISTORY OF PRESENT ILLNESS:   This is a 65 year old Male presented to the emergency department on 11/17/2015 complaining of 3 days of abdominal pain with nausea vomiting and constipation. This is associated apparently with fevers and chills. The patient was intubated and sedated on our assessment and cannot provide a history so history was obtained by chart review. CT scan was worrisome for dead gut and so he was taken to the operating room. In the operating room he was found to have viable gut that some pneumatosis. He was left open and taken to the MICU for further care.   SUBJECTIVE:  As above  VITAL SIGNS: BP 110/52 mmHg  Pulse 40  Temp(Src) 100.3 F (37.9 C) (Oral)  Resp 25  Ht 5\' 10"  (1.778 m)  Wt 168 lb 10.4 oz (76.5 kg)  BMI 24.20 kg/m2  SpO2 98%  HEMODYNAMICS: CVP:  [4 mmHg-9 mmHg] 9 mmHg  VENTILATOR SETTINGS: Vent Mode:  [-] PRVC FiO2 (%):  [40 %] 40 % Set Rate:  [14 bmp] 14 bmp Vt Set:  [590 mL-600 mL] 600 mL PEEP:  [5 cmH20] 5 cmH20 Plateau Pressure:  [14 cmH20-15 cmH20] 14 cmH20  INTAKE / OUTPUT: I/O last 3 completed shifts: In: 9227.6 [I.V.:5367.6; IV Piggyback:3860] Out: 2580 [Urine:530; Emesis/NG output:500; Drains:800; Other:700; Blood:50]  PHYSICAL EXAMINATION: General:  Thin man in NAD on vent Neuro:  Sedated, awakens to pain. HEENT:  ETT, OGT in place. Normal secretions. Cardiovascular:  Bradycardic, but normal heart sounds, appears to be CHB Lungs:  Normal mechanical breath sounds. Abdomen:  Wound vac-in place, pt winces to minimal palpation. Musculoskeletal:  No swollen joints. Skin:  No rashes on anterior body.  LABS:  BMET  Recent Labs Lab  11/17/15 1400 11/17/15 1418 11/17/15 1709 11/17/15 2000 11/18/15 0440  NA 154* 145 145 147* 145  K 3.7 3.3* 3.5 3.9 4.8  CL 95* 97*  --  104 108  CO2 27  --   --  24 23  BUN 58* 59*  --  57* 60*  CREATININE 7.47* 6.70*  --  6.23* 5.96*  GLUCOSE 139* 131*  --  137* 112*    Electrolytes  Recent Labs Lab 11/17/15 1400 11/17/15 2000 11/18/15 0440  CALCIUM 8.1* 6.5* 5.8*  MG  --  2.3 2.1  PHOS  --  4.5 4.2    CBC  Recent Labs Lab 11/17/15 1400  11/17/15 1709 11/17/15 2000 11/18/15 0440  WBC 11.1*  --   --  6.1 8.4  HGB 15.8  < > 12.9* 13.1 12.9*  HCT 47.7  < > 38.0* 39.2 38.4*  PLT 338  --   --  218 287  < > = values in this interval not displayed.  Coag's  Recent Labs Lab 11/17/15 1400 11/17/15 2000 11/18/15 0440  APTT 25  --  31  INR 1.29 1.45 1.46    Sepsis Markers  Recent Labs Lab 11/17/15 1419 11/17/15 2035 11/18/15 0440  LATICACIDVEN 12.02* 2.7* 1.5    ABG  Recent Labs Lab 11/17/15 1709 11/17/15 1908 11/18/15 0415  PHART 7.372 7.447 7.410  PCO2ART 53.9* 48.6* 38.4  PO2ART 308.0* 102.0* 131*    Liver Enzymes  Recent Labs Lab 11/17/15 1400 11/17/15 2000 11/18/15 0440  AST 39 27 28  ALT 13* 10* 12*  ALKPHOS 106 69 60  BILITOT 0.6 0.8 0.9  ALBUMIN 2.8* 2.0* 1.8*    Cardiac Enzymes  Recent Labs Lab 11/17/15 1400  TROPONINI 0.10*    Glucose  Recent Labs Lab 11/17/15 1825 11/17/15 1929 11/17/15 2335 11/18/15 0330 11/18/15 0817  GLUCAP 81 103* 128* 89 88    Imaging Ct Abdomen Pelvis Wo Contrast  11/17/2015  CLINICAL DATA:  Acute abdominal pain, nausea and vomiting for 2 days. Elevated lactate. EXAM: CT ABDOMEN AND PELVIS WITHOUT CONTRAST TECHNIQUE: Multidetector CT imaging of the abdomen and pelvis was performed following the standard protocol without IV contrast. COMPARISON:  12/08/2013 FINDINGS: Lower chest:  Minor bibasilar atelectasis versus scarring. Hepatobiliary: Scattered areas of portal venous gas  throughout the right and left hepatic lobes. No biliary dilatation. Incidental gallstones layer in the gallbladder, image 39. Pancreas: Diffusely thin atrophic. No ductal dilatation or focal abnormality by noncontrast imaging. Spleen: Within normal limits in size. Adrenals/Urinary Tract: Normal adrenal glands. No hydronephrosis or obstructing ureteral calculus on either side. Ureters are decompressed and symmetric. Left kidney demonstrates a hypodense 2 cm renal cyst in the lower pole medially, image 47. Upper pole left renal cyst anteriorly measures 10 mm, image 34. Stomach/Bowel: Stomach is moderately distended. Beginning with the fourth portion of the duodenum and involving several dilated loops of jejunum there is extensive small bowel pneumatosis throughout the entire abdomen. There is some sparing of the distal ileum. There is diffuse edema throughout the central mesentery and scattered foci of central mesenteric venous gas noted throughout the mesenteric veins. Findings compatible with small bowel ischemia and associated ileus. Colon is relatively decompressed but contains air. Minor scattered colonic diverticulosis most pronounced in the sigmoid. No fluid collection or abscess. No definite pneumoperitoneum. Vascular/Lymphatic: Extensive atherosclerosis of the aorta and abdominal vasculature. No aneurysm or retroperitoneal hemorrhage. No significant adenopathy. Reproductive: No mass or other significant abnormality. Other: Right hip fixation hardware creates artifact. No inguinal abnormality or hernia. Small fat containing umbilical hernia evident. Postop changes from inguinal hernia repairs without recurrence. Musculoskeletal: Bones are osteopenic. Degenerative changes of the spine. No acute osseous finding. IMPRESSION: Diffuse extensive small bowel pneumatosis with mild dilatation throughout the abdomen and pelvis involving the distal duodenum and the jejunum with some sparing of the ileum compatible with  small bowel ischemia. Diffuse central mesenteric and portal venous gas with central mesenteric edema/congestion. Associated diffuse small bowel ileus Extensive abdominal and aortic atherosclerosis No significant pneumoperitoneum or abscess Cholelithiasis Colonic diverticulosis These results were called by telephone at the time of interpretation on 11/17/2015 at 3:15 pm to Dr. Ezequiel Essex , who verbally acknowledged these results. Electronically Signed   By: Jerilynn Mages.  Shick M.D.   On: 11/17/2015 15:19   Portable Chest Xray  11/18/2015  CLINICAL DATA:  Respiratory failure EXAM: PORTABLE CHEST 1 VIEW COMPARISON:  Chest radiograph from one day prior. FINDINGS: Endotracheal tube tip is 2.0 cm above the carina. Enteric tube enters the stomach with the tip not seen on this image. Right internal jugular central venous catheter terminates at the cavoatrial junction. Stable cardiomediastinal silhouette with normal heart size. No pneumothorax. Trace right pleural effusion. No overt pulmonary edema. Hazy opacities at the right greater than left lung bases, slightly worsened bilaterally. IMPRESSION: 1. Well-positioned support hardware as described . 2. Slightly worsened hazy bibasilar lung opacities, right greater than left, favor atelectasis, cannot exclude a developing pneumonia. Electronically Signed   By: Janina Mayo.D.  On: 11/18/2015 09:56   Portable Chest Xray  11/17/2015  CLINICAL DATA:  Acute respiratory failure with hypoxemia EXAM: PORTABLE CHEST 1 VIEW COMPARISON:  Chest radiograph from earlier today. FINDINGS: Endotracheal tube tip is 4 cm above the carina. Enteric tube terminates in the proximal stomach. Right internal jugular central venous catheter terminates in the middle third of the superior vena cava. Stable cardiomediastinal silhouette with normal heart size. No pneumothorax. No pleural effusion. No pulmonary edema. Mild right basilar scarring versus atelectasis. No acute consolidative airspace  disease. IMPRESSION: Well-positioned support structures as described. Mild scarring versus atelectasis at the right lung base. Electronically Signed   By: Ilona Sorrel M.D.   On: 11/17/2015 18:48   Dg Chest Portable 1 View  11/17/2015  CLINICAL DATA:  Shortness of breath, decreased blood pressure in O2 sats. Cough. Aspiration? EXAM: PORTABLE CHEST 1 VIEW COMPARISON:  Chest x-ray dated 12/08/2013. FINDINGS: Cardiomediastinal silhouette is stable in size and configuration. Probable mild scarring at the right lung base. Lungs otherwise clear. No pleural effusion or pneumothorax seen. Osseous and soft tissue structures about the chest are unremarkable. IMPRESSION: No active disease. Electronically Signed   By: Franki Cabot M.D.   On: 11/17/2015 14:49   Dg Abd Portable 1v  11/17/2015  CLINICAL DATA:  Severe lower abdominal pain.  GI bleed. EXAM: PORTABLE ABDOMEN - 1 VIEW COMPARISON:  12/08/2013 CT abdomen/pelvis. FINDINGS: There is prominent pneumatosis involving mildly dilated small bowel loops throughout the abdomen. Surgical sutures are noted in the left mid abdomen. No gross pneumoperitoneum on this supine image. Moderate stool in the rectum. Partially visualized left proximal femur hardware. IMPRESSION: Severe pneumatosis involving mildly dilated small bowel loops throughout the abdomen. Findings are worrisome for diffuse small bowel ischemia. Recommend correlation with CT abdomen/pelvis with IV contrast. These results were called by telephone at the time of interpretation on 11/17/2015 at 2:48 pm to Dr. Ezequiel Essex , who verbally acknowledged these results. Electronically Signed   By: Ilona Sorrel M.D.   On: 11/17/2015 14:50     STUDIES:  11/17/2015 CT abdomen and pelvis: Diffuse extensive small bowel pneumatosis with mild dilation throughout the abdomen and pelvisconsistent with small bowel ischemia. Diffuse central Mesentericand portal venous small gas, Associated diffuse small bowel ileus,  extensive abdominal and aortic atherosclerosis, no significant pneumoperitoneum or abscess, cholelithiasis, colonic diverticulosis  CULTURES: 3/17 Blood >  ANTIBIOTICS: March 17 Zosyn>  3/18 vanc>> 3/18 eraxis>>  SIGNIFICANT EVENTS: 11/17/2015 admission, exploratory laparotomy  LINES/TUBES: 11/17/2015 central venous line > 11/17/2015 arterial line > 11/17/2015 endotracheal tube >  DISCUSSION: This is a 65 year old male with a past medical history significant forTobacco abuse, bipolar disorder and alcoholism who [facility] department on 11/17/2015 with severe sepsis in the setting of diffuse abdominal pneumatosis but no evidence of dead bowel on exploratory laparotomy. Problems include acute respiratory failure with hypoxemia and acute kidney injury. The exact cause of the pneumatosis is uncertain but likely is the underlying cause of his sepsis.  ASSESSMENT / PLAN:  PULMONARY A: Acute respiratory failure with hypoxemia P:   Full ventilatory support At this time there is no plan for extubation as his abdomen will be left open Vent bundle  CARDIOVASCULAR A:  Septic shock, status post 40 mL per Kg, still w/ CVP of 5 Heart block with bradycardia P:   Levophed, Titrate to mean arterial pressure greater than 65 Telemetry monitoring CVP monitoring Arterial line for blood pressure management Follow lactic acid Cards consult, may  need temp pacer, called and spoke with Dr. Debara Pickett 3/17 1000 .Transcutaneous pacer made available  RENAL Lab Results  Component Value Date   CREATININE 5.96* 11/18/2015   CREATININE 6.23* 11/17/2015   CREATININE 6.70* 11/17/2015   CREATININE 0.76 09/04/2011    A:   Acute kidney injury P:   Suspect ATN Foley catheter Aggressive IV fluid resuscitation Repeat BMET later this evening, may need dialysis  GASTROINTESTINAL A:   Small bowel pneumatosis, yet viable etiology uncertain Question upper GI bleed P:   Continue PPI  infusion Postoperative management per surgery, wound to be left open for now NPO  HEMATOLOGIC A:   No acute issues P:  Monitor for bleeding  INFECTIOUS A:   Septic shock secondary to peritonitis P:   Order blood cultures Continue Zosyn Add Anidulofungin and vancomycin  ENDOCRINE CBG (last 3)   Recent Labs  11/17/15 2335 11/18/15 0330 11/18/15 0817  GLUCAP 128* 89 88     A:   Hyperglycemia   P:   Sliding-scale every 4  Hours with Accu-Cheks  NEUROLOGIC A:   Post op pain Sedation needs for ventilator synchrony P:   RASS goal: -1 Fentanyl drip   FAMILY  - Updates: *Unable to contact son, message left to return call to unit.Bertram Savin family meet or Palliative Care meeting due by:  3/24  Richardson Landry Minor ACNP [facility] Shape PCCM Pager [phone] till 3 pm If no answer page [phone] 11/18/2015, 10:02 AM   Attending Note:  I have examined patient, reviewed labs, studies and notes. I have discussed the case with S Minor, and I agree with the data and plans as amended above. Pt with septic shock and MODS presumed due to bowel injury without full bowel ischemia. No bowel resected 3/17, abd left open. On eval today he was in 3rd degree AV block, documented on ECG. Cardiology consulted for eval - he then went into A Fib. Plan to keep him sedated, support with MV, pressors. He will go back to OR for inspection before closure. Independent critical care time is 35 minutes.   Baltazar Apo, MD, PhD 11/18/2015, 6:46 PM [facility] [phone] or if no answer [phone]

## 2015-11-18 NOTE — Progress Notes (Signed)
EKG CRITICAL VALUE     12 lead EKG performed.  Critical value noted.  Angela Nevin, RN notified.   Neva Seat, CCT 11/18/2015 10:10 AM

## 2015-11-18 NOTE — Progress Notes (Signed)
Called about an elevated troponin of 1.18 for Mr. Riggan. I personally reviewed his echocardiogram. It demonstrates a reduced EF of possibly 40% with inferior and lateral severe hypokinesis to akinesis. The images were quite limited, however I suspect this may be a new inferior MI. Since he is immediately postsurgical with plans for return to the OR, I discussed anticoagulation with surgery. Dr. Grandville Silos agreed that we could use IV heparin, but to hold it at least 4 hours prior to going to the OR tomorrow.  Will hold on aspirin until after surgery.  Pixie Casino, MD, [facility] Attending Cardiologist [place]

## 2015-11-18 NOTE — Progress Notes (Signed)
  Echocardiogram 2D Echocardiogram has been performed.  Donata Clay 11/18/2015, 1:13 PM

## 2015-11-18 NOTE — Progress Notes (Signed)
[place] Progress Note Patient Name: Shawn [name] DOB: 09/01/1951 MRN: [id number]   Date of Service  11/18/2015  HPI/Events of Note  Notified by bedside nurse of hypoglycemia & fever. Patient's cultures of blood nearly 24 hours old.  eICU Interventions  1. Stat blood, tracheal aspirate, & urine culture 2. Tylenol suppository when necessary fever 3. Switching IV fluid to D5 half-normal saline at 100 mL per hour 4. Continue accu-checks q4hr     Intervention Category Major Interventions: Sepsis - evaluation and management  Tera Partridge 11/18/2015, 4:28 PM

## 2015-11-18 NOTE — Progress Notes (Signed)
[place] Progress Note Patient Name: Shawn [name] DOB: 1951-07-30 MRN: [id number]   Date of Service  11/18/2015  HPI/Events of Note  Ca 5.8, Albumin 1.8. Corrected Ca 7.4  eICU Interventions  Replete      Intervention Category Intermediate Interventions: Electrolyte abnormality - evaluation and management  Olia Hinderliter 11/18/2015, 6:08 AM

## 2015-11-18 NOTE — Progress Notes (Signed)
CRITICAL VALUE ALERT  Critical value received: Ca+ 5.8  Date of notification:  11-18-15  Time of notification:  [id number]  Critical value read back:Yes.    Nurse who received alert:  York Pellant  MD notified (1st page):  Warren Lacy  Time of first page:  0600  Responding MD:  Warren Lacy  Time MD responded: 0600

## 2015-11-18 NOTE — Anesthesia Postprocedure Evaluation (Signed)
Anesthesia Post Note  Patient: Shawn [name]  Procedure(s) Performed: Procedure(s) (LRB): EXPLORATORY LAPAROTOMY (N/A)  Patient location during evaluation: PACU Anesthesia Type: General Level of consciousness: awake and alert Pain management: pain level controlled Vital Signs Assessment: post-procedure vital signs reviewed and stable Respiratory status: spontaneous breathing, nonlabored ventilation, respiratory function stable and patient remains intubated per anesthesia plan Cardiovascular status: blood pressure returned to baseline and stable Postop Assessment: no signs of nausea or vomiting Anesthetic complications: no    Last Vitals:  Filed Vitals:   11/18/15 1200 11/18/15 1215  BP: 89/60   Pulse:    Temp:    Resp: 14 14    Last Pain:  Filed Vitals:   11/18/15 1215  PainSc: 7                  Kasey Hansell A

## 2015-11-18 NOTE — Progress Notes (Signed)
[place] for heparin Indication: chest pain/ACS  Allergies  Allergen Reactions  . Codeine Nausea Only    flushing  . Depakote [Divalproex Sodium] Other (See Comments)    Makes me crazy    Patient Measurements: Height: 5\' 10"  (177.8 cm) Weight: 168 lb 10.4 oz (76.5 kg) IBW/kg (Calculated) : 73  Vital Signs: Temp: 100.2 F (37.9 C) (03/18 1955) Temp Source: Oral (03/18 1955) BP: 77/52 mmHg (03/18 1830) Pulse Rate: 106 (03/18 1800)  Labs:  Recent Labs  11/17/15 1400 11/17/15 1418 11/17/15 1709 11/17/15 2000 11/18/15 0440 11/18/15 1205 11/18/15 1806 11/18/15 2130  HGB 15.8 16.0 12.9* 13.1 12.9*  --   --   --   HCT 47.7 47.0 38.0* 39.2 38.4*  --   --   --   PLT 338  --   --  218 287  --   --   --   APTT 25  --   --   --  31  --   --   --   LABPROT 16.2*  --   --  17.7* 17.8*  --   --   --   INR 1.29  --   --  1.45 1.46  --   --   --   HEPARINUNFRC  --   --   --   --   --   --   --  0.10*  CREATININE 7.47* 6.70*  --  6.23* 5.96*  --   --   --   TROPONINI 0.10*  --   --   --   --  1.18* 1.23*  --     Estimated Creatinine Clearance: 12.4 mL/min (by C-G formula based on Cr of 5.96).  Assessment: 65 yo male presenting with 3 day shx of abdominal pain N/V, constopation, fevers, chills. Now with troponin bump, possible NSTEMI  First heparin level low at 0.1 units/hr. No bleeding noted. Hgb 12.9, plts 287.   Patient has open abdomen return to OR for closure tomorrow. RN Aware to turn off heparin 4 hrs prior to OR  Goal of Therapy:  Heparin level 0.3-0.7 units/ml Monitor platelets by anticoagulation protocol: Yes   Plan:  Increase heparin to 1150 units/hr Daily HL, CBC RN aware to stop 4 hrs prior to [place] D. Mansour Balboa, PharmD, BCPS Clinical Pharmacist Pager: [phone] 11/18/2015 10:26 PM

## 2015-11-18 NOTE — Progress Notes (Signed)
Initial Nutrition Assessment  DOCUMENTATION CODES:  Non-severe (moderate) malnutrition in context of chronic illness   Pt meets criteria for MODERATE MALNUTRITION in the context of Chronic illness as evidenced by Mild muscle/fat wasting.  INTERVENTION:  If Enteral Nutrition deemed same. Reccomend TF as soon as able via NT. Start VHP at 25 ml/h and Prostat 30 ml BID on day 1; on day 2, d/c Prostat and increase to goal rate of 75 ml/h 1800 ml per day) to provide 1800 kcals (+ 98 from sedation), 158 gm protein,1505 ml free water daily.  Pt at high nutrition risk and if EN not deemed feasible due to risk of ischemic bowel, recommend TPN as soon as able.   NUTRITION DIAGNOSIS:  Inadequate oral intake related to inability to eat as evidenced by NPO status.  GOAL:  Patient will meet greater than or equal to 90% of their needs  MONITOR:  Labs, Vent status, Diet advancement, Weight trends, Skin, I & O's  REASON FOR ASSESSMENT:  Ventilator    ASSESSMENT:  65 y/o male PMHx Substance abuse, depression/anxiety, Bipolar disorder, SBR for incarcerated femoral hernia and perforation. Presented with 3 day hx of N/V/C fever chills, abdominal pain. CT of abdomen shows extensive SB pneumatosis, mesenteric, portal venous gas. Taken emergently to OR for ex lap. No evidence of necrotic bowel, though abdomen left open at this time. Remains intubated/sedated  Patient intubated, sedated. No historians present.   On pressor, propofol, fentanyl.   NG tube to suction. 650 ml out since placement at 5:20 pm yesterday. VAC output 100 ml today, 800 mls in total. + 6 liters since admission  Per RN, to go to OR tomorrow for abdomen closure.   May need TPN if signs of ischemic bowel  Patient is currently intubated on ventilator support MV: 8.5 L/min Temp (24hrs), Avg:98.7 F (37.1 C), Min:96.9 F (36.1 C), Max:100.7 F (38.2 C)  Propofol: 3.7 ml/hr = 98 kcal/day  Labs reviewed: MAP: 67, hypotensive,  hypoalbuminemia, AKI   NFPE: Patient has mild muscle/fat wasting.   Diet Order:  Diet NPO time specified  Skin: open abdomen-vac in place, PU stage 2 buttocks  Last BM:  Unknown  Height:  Ht Readings from Last 1 Encounters:  11/17/15 5\' 10"  (1.778 m)   Weight:  Wt Readings from Last 1 Encounters:  11/17/15 168 lb 10.4 oz (76.5 kg)   Wt Readings from Last 10 Encounters:  11/17/15 168 lb 10.4 oz (76.5 kg)  11/20/13 158 lb 4.6 oz (71.8 kg)  11/17/13 160 lb (72.576 kg)  11/14/13 165 lb (74.844 kg)  04/21/13 164 lb 12.8 oz (74.753 kg)  04/12/13 163 lb (73.936 kg)  11/13/12 166 lb 8 oz (75.524 kg)  10/01/12 170 lb 3.2 oz (77.202 kg)  08/06/12 191 lb (86.637 kg)  08/03/12 161 lb 9.6 oz (73.301 kg)   Ideal Body Weight:  75.45 kg  BMI:  Body mass index is 24.2 kg/(m^2).  Estimated Nutritional Needs:  Kcal:  1920 kcals Protein:  >153 g (2g/kg bw) Fluid:  Per MD  EDUCATION NEEDS:  No education needs identified at this time  Burtis Junes RD, LDN Nutrition Pager: [phone] 11/18/2015 1:14 PM

## 2015-11-18 NOTE — Progress Notes (Signed)
Troponin result 1.18 called to Dr Debara Pickett

## 2015-11-18 NOTE — Consult Note (Signed)
CONSULTATION NOTE  Reason for Consult: Complete heart block  Requesting Physician: Gaylyn Lambert, NP (PCCM)  Cardiologist: None (NEW)  HPI: This is a 65 y.o. male with a past medical history significant for 3 days of progressive abdominal pain, nausea, vomiting, constipation, fever and chills. He has a history of small bowel resection and incarcerated femoral hernia in the past had prior hernia repair in 2016 at [facility]. CT of the abdomen demonstrated extensive small bowel pneumatosis with mesenteric and portal vein gas. Surgery evaluated and feel that he has significant ischemic bowel. His lactic acid level was very high at 12. I was consulted by pulmonary and critical care medicine as the patient was noted to be in complete heart block today with a heart rate in the 30s. While examining him, he was noted to go into a sinus tachycardia with an underlying right bundle branch block at 110 bpm. He is noted to be in shock and is currently on levophed with a mean arterial pressure in the upper 50s. He denies any cardiac history in the past. He has a history of alcohol and tobacco abuse as well as bipolar disorder. No family history of coronary disease was provided by his sister and niece who were present at the bedside. Cardiology is asked to recommend therapy for intermittent complete heart block.  PMHx:  Past Medical History  Diagnosis Date  . Osteoarthritis X years    left knee.  Distant hx (age 41) "dislocated" knee and required surgery, then crush injury to patella required knee cap removal.  . Diverticular disease     "itis" x 2 episodes (last was about 2007)  . Tobacco dependence   . Multiple rib fractures 10/09/10    s/p fall down a ravine--9th and 10th on right, contusion of left 7th and 8th ribs  . Fracture of metatarsal bone(s), closed     3rd, 4th, 5th mid shaft on left foot  . History of substance abuse     cocaine, marijuana, acid, alcohol (in remission since 2010)  . Solar  dermatitis     face and ears  . Bipolar disorder     Has had multiple psych hospitalization in the past, most recently at [place] [facility] 01/31/11-02/04/11.  Says meds made him emotionally blunted. (lamictal and wellbutrin).  . Alcoholism     "Dry" since 2002; relapse 2014  . Depression   . Anxiety   . Vitamin B12 deficiency 10/2013    IF ab positive.  Replacement therapy started end of March 2015   Past Surgical History  Procedure Laterality Date  . Knee dislocation surgery  1966  . Patellectomy  1968    s/p crush injury  . Inguinal hernia repair      left  . Hernia repair      at 65 years of age  62  . Total knee arthroplasty  11/20/2011    Procedure: TOTAL KNEE ARTHROPLASTY;  Surgeon: Ninetta Lights, MD;  Location: [facility];  Service: Orthopedics;  Laterality: Left;  DR Percell Miller WANTS 90MINUTES FOR THIS CASE  . Inguinal hernia repair  06/15/2012    Procedure: HERNIA REPAIR INGUINAL INCARCERATED;  Surgeon: Edward Jolly, MD;  Location: [facility];  Service: General;  Laterality: Right;  . Bowel resection  06/15/2012    Procedure: SMALL BOWEL RESECTION;  Surgeon: Edward Jolly, MD;  Location: [facility];  Service: General;  Laterality: N/A;  . Femur im nail  08/06/2012    Procedure: INTRAMEDULLARY (IM)  NAIL FEMORAL;  Surgeon: Johnn Hai, MD;  Location: [facility];  Service: Orthopedics;  Laterality: Left;    FAMHx: Family History  Problem Relation Age of Onset  . Arthritis Mother   . Arthritis Father   . Cancer Brother     lung cancer.  Died in early 40s.    SOCHx:  reports that he has been smoking Cigarettes.  He has a 30 pack-year smoking history. He has never used smokeless tobacco. He reports that he drinks alcohol. He reports that he does not use illicit drugs.  ALLERGIES: Allergies  Allergen Reactions  . Codeine Nausea Only    flushing  . Depakote [Divalproex Sodium] Other (See Comments)    Makes me crazy    ROS: Review of systems not obtained due to patient  factors.  HOME MEDICATIONS:   Medication List    ASK your doctor about these medications        hydroxypropyl methylcellulose / hypromellose 2.5 % ophthalmic solution  Commonly known as:  ISOPTO TEARS / GONIOVISC  Place 2 drops into both eyes as needed for dry eyes.        HOSPITAL MEDICATIONS: I have reviewed the patient's current medications.  VITALS: Blood pressure 74/62, pulse 37, temperature 100.3 F (37.9 C), temperature source Oral, resp. rate 14, height _0  (1.778 m), weight 168 lb 10.4 oz (76.5 kg), SpO2 98 %.  PHYSICAL EXAM: General appearance: Intubated, moderately sedated on the ventilator, opens eyes but does not purposely follow commands Neck: no carotid bruit and no JVD Lungs: diminished breath sounds bilaterally Heart: Regular tachycardia, no murmur Abdomen: Soft, diffusely tender, the patient winces with abdominal pressure Extremities: extremities normal, atraumatic, no cyanosis or edema Pulses: 2+ and symmetric Skin: Pale, cool, dry Neurologic: Mental status: Sedated, opens eyes spontaneously Psych: Cannot be evaluated  LABS: Results for orders placed or performed during the hospital encounter of 11/17/15 (from the past 48 hour(s))  Comprehensive metabolic panel     Status: Abnormal   Collection Time: 11/17/15  2:00 PM  Result Value Ref Range   Sodium 154 (H) 135 - 145 mmol/L   Potassium 3.7 3.5 - 5.1 mmol/L   Chloride 95 (L) 101 - 111 mmol/L   CO2 27 22 - 32 mmol/L   Glucose, Bld 139 (H) 65 - 99 mg/dL   BUN 58 (H) 6 - 20 mg/dL   Creatinine, Ser 7.47 (H) 0.61 - 1.24 mg/dL   Calcium 8.1 (L) 8.9 - 10.3 mg/dL   Total Protein 5.6 (L) 6.5 - 8.1 g/dL   Albumin 2.8 (L) 3.5 - 5.0 g/dL   AST 39 15 - 41 U/L   ALT 13 (L) 17 - 63 U/L   Alkaline Phosphatase 106 38 - 126 U/L   Total Bilirubin 0.6 0.3 - 1.2 mg/dL   GFR calc non Af Amer 7 (L) >60 mL/min   GFR calc Af Amer 8 (L) >60 mL/min    Comment: (NOTE) The eGFR has been calculated using the CKD EPI  equation. This calculation has not been validated in all clinical situations. eGFR's persistently <60 mL/min signify possible Chronic Kidney Disease.    Anion gap 32 (H) 5 - 15  CBC WITH DIFFERENTIAL     Status: Abnormal   Collection Time: 11/17/15  2:00 PM  Result Value Ref Range   WBC 11.1 (H) 4.0 - 10.5 K/uL   RBC 5.12 4.22 - 5.81 MIL/uL   Hemoglobin 15.8 13.0 - 17.0 g/dL   HCT 47.7 39.0 -  52.0 %   MCV 93.2 78.0 - 100.0 fL   MCH 30.9 26.0 - 34.0 pg   MCHC 33.1 30.0 - 36.0 g/dL   RDW 14.0 11.5 - 15.5 %   Platelets 338 150 - 400 K/uL   Neutrophils Relative % 77 %   Lymphocytes Relative 13 %   Monocytes Relative 9 %   Eosinophils Relative 0 %   Basophils Relative 1 %   Neutro Abs 8.6 (H) 1.7 - 7.7 K/uL   Lymphs Abs 1.4 0.7 - 4.0 K/uL   Monocytes Absolute 1.0 0.1 - 1.0 K/uL   Eosinophils Absolute 0.0 0.0 - 0.7 K/uL   Basophils Absolute 0.1 0.0 - 0.1 K/uL   RBC Morphology POLYCHROMASIA PRESENT    WBC Morphology TOXIC GRANULATION     Comment: MILD LEFT SHIFT (1-5% METAS, OCC MYELO, OCC BANDS) ATYPICAL LYMPHOCYTES   APTT     Status: None   Collection Time: 11/17/15  2:00 PM  Result Value Ref Range   aPTT 25 24 - 37 seconds  Protime-INR     Status: Abnormal   Collection Time: 11/17/15  2:00 PM  Result Value Ref Range   Prothrombin Time 16.2 (H) 11.6 - 15.2 seconds   INR 1.29 0.00 - 1.49  Lipase, blood     Status: None   Collection Time: 11/17/15  2:00 PM  Result Value Ref Range   Lipase 13 11 - 51 U/L  Troponin I     Status: Abnormal   Collection Time: 11/17/15  2:00 PM  Result Value Ref Range   Troponin I 0.10 (H) <0.031 ng/mL    Comment:        PERSISTENTLY INCREASED TROPONIN VALUES IN THE RANGE OF 0.04-0.49 ng/mL CAN BE SEEN IN:       -UNSTABLE ANGINA       -CONGESTIVE HEART FAILURE       -MYOCARDITIS       -CHEST TRAUMA       -ARRYHTHMIAS       -LATE PRESENTING MYOCARDIAL INFARCTION       -COPD   CLINICAL FOLLOW-UP RECOMMENDED.   Type and screen [place]     Status: None   Collection Time: 11/17/15  2:17 PM  Result Value Ref Range   ABO/RH(D) A POS    Antibody Screen NEG    Sample Expiration 11/20/2015   I-Stat Chem 8, ED  (not at [facility], [facility])     Status: Abnormal   Collection Time: 11/17/15  2:18 PM  Result Value Ref Range   Sodium 145 135 - 145 mmol/L   Potassium 3.3 (L) 3.5 - 5.1 mmol/L   Chloride 97 (L) 101 - 111 mmol/L   BUN 59 (H) 6 - 20 mg/dL   Creatinine, Ser 6.70 (H) 0.61 - 1.24 mg/dL   Glucose, Bld 131 (H) 65 - 99 mg/dL   Calcium, Ion 0.84 (L) 1.13 - 1.30 mmol/L   TCO2 29 0 - 100 mmol/L   Hemoglobin 16.0 13.0 - 17.0 g/dL   HCT 47.0 39.0 - 52.0 %  I-Stat CG4 Lactic Acid, ED  (not at [facility])     Status: Abnormal   Collection Time: 11/17/15  2:19 PM  Result Value Ref Range   Lactic Acid, Venous 12.02 (HH) 0.5 - 2.0 mmol/L   Comment NOTIFIED PHYSICIAN   Occult bld gastric/duodenum (cup to lab)     Status: Abnormal   Collection Time: 11/17/15  2:22 PM  Result Value Ref Range  pH, Gastric NOT DONE    Occult Blood, Gastric POSITIVE (A) NEGATIVE  POC occult blood, ED Provider will collect     Status: Abnormal   Collection Time: 11/17/15  2:33 PM  Result Value Ref Range   Fecal Occult Bld POSITIVE (A) NEGATIVE  I-STAT 7, (LYTES, BLD GAS, ICA, H+H)     Status: Abnormal   Collection Time: 11/17/15  5:09 PM  Result Value Ref Range   pH, Arterial 7.372 7.350 - 7.450   pCO2 arterial 53.9 (H) 35.0 - 45.0 mmHg   pO2, Arterial 308.0 (H) 80.0 - 100.0 mmHg   Bicarbonate 31.6 (H) 20.0 - 24.0 mEq/L   TCO2 33 0 - 100 mmol/L   O2 Saturation 100.0 %   Acid-Base Excess 5.0 (H) 0.0 - 2.0 mmol/L   Sodium 145 135 - 145 mmol/L   Potassium 3.5 3.5 - 5.1 mmol/L   Calcium, Ion 0.86 (L) 1.13 - 1.30 mmol/L   HCT 38.0 (L) 39.0 - 52.0 %   Hemoglobin 12.9 (L) 13.0 - 17.0 g/dL   Patient temperature 36.0 C    Sample type ARTERIAL   Glucose, capillary     Status: None   Collection Time: 11/17/15  6:25 PM  Result Value Ref  Range   Glucose-Capillary 81 65 - 99 mg/dL  MRSA PCR Screening     Status: None   Collection Time: 11/17/15  6:30 PM  Result Value Ref Range   MRSA by PCR NEGATIVE NEGATIVE    Comment:        The GeneXpert MRSA Assay (FDA approved for NASAL specimens only), is one component of a comprehensive MRSA colonization surveillance program. It is not intended to diagnose MRSA infection nor to guide or monitor treatment for MRSA infections.   I-STAT 3, arterial blood gas (G3+)     Status: Abnormal   Collection Time: 11/17/15  7:08 PM  Result Value Ref Range   pH, Arterial 7.447 7.350 - 7.450   pCO2 arterial 48.6 (H) 35.0 - 45.0 mmHg   pO2, Arterial 102.0 (H) 80.0 - 100.0 mmHg   Bicarbonate 33.5 (H) 20.0 - 24.0 mEq/L   TCO2 35 0 - 100 mmol/L   O2 Saturation 98.0 %   Acid-Base Excess 8.0 (H) 0.0 - 2.0 mmol/L   Patient temperature 98.7 F    Collection site ARTERIAL LINE    Drawn by RT    Sample type ARTERIAL   Glucose, capillary     Status: Abnormal   Collection Time: 11/17/15  7:29 PM  Result Value Ref Range   Glucose-Capillary 103 (H) 65 - 99 mg/dL  CBC     Status: None   Collection Time: 11/17/15  8:00 PM  Result Value Ref Range   WBC 6.1 4.0 - 10.5 K/uL   RBC 4.30 4.22 - 5.81 MIL/uL   Hemoglobin 13.1 13.0 - 17.0 g/dL   HCT 39.2 39.0 - 52.0 %   MCV 91.2 78.0 - 100.0 fL   MCH 30.5 26.0 - 34.0 pg   MCHC 33.4 30.0 - 36.0 g/dL   RDW 13.9 11.5 - 15.5 %   Platelets 218 150 - 400 K/uL  Comprehensive metabolic panel     Status: Abnormal   Collection Time: 11/17/15  8:00 PM  Result Value Ref Range   Sodium 147 (H) 135 - 145 mmol/L   Potassium 3.9 3.5 - 5.1 mmol/L   Chloride 104 101 - 111 mmol/L   CO2 24 22 - 32 mmol/L   Glucose, Bld  137 (H) 65 - 99 mg/dL   BUN 57 (H) 6 - 20 mg/dL   Creatinine, Ser 6.23 (H) 0.61 - 1.24 mg/dL   Calcium 6.5 (L) 8.9 - 10.3 mg/dL   Total Protein 4.1 (L) 6.5 - 8.1 g/dL   Albumin 2.0 (L) 3.5 - 5.0 g/dL   AST 27 15 - 41 U/L   ALT 10 (L) 17 - 63 U/L     Alkaline Phosphatase 69 38 - 126 U/L   Total Bilirubin 0.8 0.3 - 1.2 mg/dL   GFR calc non Af Amer 8 (L) >60 mL/min   GFR calc Af Amer 10 (L) >60 mL/min    Comment: (NOTE) The eGFR has been calculated using the CKD EPI equation. This calculation has not been validated in all clinical situations. eGFR's persistently <60 mL/min signify possible Chronic Kidney Disease.    Anion gap 19 (H) 5 - 15  Magnesium     Status: None   Collection Time: 11/17/15  8:00 PM  Result Value Ref Range   Magnesium 2.3 1.7 - 2.4 mg/dL  Phosphorus     Status: None   Collection Time: 11/17/15  8:00 PM  Result Value Ref Range   Phosphorus 4.5 2.5 - 4.6 mg/dL  Protime-INR     Status: Abnormal   Collection Time: 11/17/15  8:00 PM  Result Value Ref Range   Prothrombin Time 17.7 (H) 11.6 - 15.2 seconds   INR 1.45 0.00 - 1.49  Triglycerides     Status: Abnormal   Collection Time: 11/17/15  8:00 PM  Result Value Ref Range   Triglycerides 164 (H) <150 mg/dL  Urinalysis, Routine w reflex microscopic (not at [facility])     Status: Abnormal   Collection Time: 11/17/15  8:15 PM  Result Value Ref Range   Color, Urine RED (A) YELLOW    Comment: BIOCHEMICALS MAY BE AFFECTED BY COLOR   APPearance TURBID (A) CLEAR   Specific Gravity, Urine 1.022 1.005 - 1.030   pH 5.0 5.0 - 8.0   Glucose, UA NEGATIVE NEGATIVE mg/dL   Hgb urine dipstick LARGE (A) NEGATIVE   Bilirubin Urine MODERATE (A) NEGATIVE   Ketones, ur 15 (A) NEGATIVE mg/dL   Protein, ur 100 (A) NEGATIVE mg/dL   Nitrite POSITIVE (A) NEGATIVE   Leukocytes, UA SMALL (A) NEGATIVE  Urine microscopic-add on     Status: Abnormal   Collection Time: 11/17/15  8:15 PM  Result Value Ref Range   Squamous Epithelial / LPF 0-5 (A) NONE SEEN   WBC, UA 0-5 0 - 5 WBC/hpf   RBC / HPF 6-30 0 - 5 RBC/hpf   Bacteria, UA FEW (A) NONE SEEN   Crystals CA OXALATE CRYSTALS (A) NEGATIVE   Urine-Other MICROSCOPIC EXAM PERFORMED ON UNCONCENTRATED URINE     Comment: AMORPHOUS  URATES/PHOSPHATES  Lactic acid, plasma     Status: Abnormal   Collection Time: 11/17/15  8:35 PM  Result Value Ref Range   Lactic Acid, Venous 2.7 (HH) 0.5 - 2.0 mmol/L    Comment: CRITICAL RESULT CALLED TO, READ BACK BY AND VERIFIED WITH: PORTER C,RN 11/17/15 2143 WAYK   Carboxyhemoglobin     Status: Abnormal   Collection Time: 11/17/15  8:35 PM  Result Value Ref Range   Total hemoglobin 12.9 (L) 13.5 - 18.0 g/dL   O2 Saturation 62.5 %   Carboxyhemoglobin 0.4 (L) 0.5 - 1.5 %   Methemoglobin 1.5 0.0 - 1.5 %  Glucose, capillary     Status: Abnormal  Collection Time: 11/17/15 11:35 PM  Result Value Ref Range   Glucose-Capillary 128 (H) 65 - 99 mg/dL  Glucose, capillary     Status: None   Collection Time: 11/18/15  3:30 AM  Result Value Ref Range   Glucose-Capillary 89 65 - 99 mg/dL  Blood gas, arterial     Status: Abnormal   Collection Time: 11/18/15  4:15 AM  Result Value Ref Range   FIO2 0.40    Delivery systems VENTILATOR    Mode PRESSURE REGULATED VOLUME CONTROL    VT 590 mL   LHR 14 resp/min   Peep/cpap 5.0 cm H20   pH, Arterial 7.410 7.350 - 7.450   pCO2 arterial 38.4 35.0 - 45.0 mmHg   pO2, Arterial 131 (H) 80.0 - 100.0 mmHg   Bicarbonate 23.9 20.0 - 24.0 mEq/L   TCO2 25.0 0 - 100 mmol/L   Acid-base deficit 0.2 0.0 - 2.0 mmol/L   O2 Saturation 97.3 %   Patient temperature 98.6    Collection site A-LINE    Drawn by [phone]    Sample type ARTERIAL DRAW   Comprehensive metabolic panel     Status: Abnormal   Collection Time: 11/18/15  4:40 AM  Result Value Ref Range   Sodium 145 135 - 145 mmol/L   Potassium 4.8 3.5 - 5.1 mmol/L   Chloride 108 101 - 111 mmol/L   CO2 23 22 - 32 mmol/L   Glucose, Bld 112 (H) 65 - 99 mg/dL   BUN 60 (H) 6 - 20 mg/dL   Creatinine, Ser 5.96 (H) 0.61 - 1.24 mg/dL   Calcium 5.8 (LL) 8.9 - 10.3 mg/dL    Comment: CRITICAL RESULT CALLED TO, READ BACK BY AND VERIFIED WITH: PORTER C,RN 11/18/15 0536 WAYK    Total Protein 4.1 (L) 6.5 - 8.1  g/dL   Albumin 1.8 (L) 3.5 - 5.0 g/dL   AST 28 15 - 41 U/L   ALT 12 (L) 17 - 63 U/L   Alkaline Phosphatase 60 38 - 126 U/L   Total Bilirubin 0.9 0.3 - 1.2 mg/dL   GFR calc non Af Amer 9 (L) >60 mL/min   GFR calc Af Amer 10 (L) >60 mL/min    Comment: (NOTE) The eGFR has been calculated using the CKD EPI equation. This calculation has not been validated in all clinical situations. eGFR's persistently <60 mL/min signify possible Chronic Kidney Disease.    Anion gap 14 5 - 15  Magnesium     Status: None   Collection Time: 11/18/15  4:40 AM  Result Value Ref Range   Magnesium 2.1 1.7 - 2.4 mg/dL  Phosphorus     Status: None   Collection Time: 11/18/15  4:40 AM  Result Value Ref Range   Phosphorus 4.2 2.5 - 4.6 mg/dL  APTT     Status: None   Collection Time: 11/18/15  4:40 AM  Result Value Ref Range   aPTT 31 24 - 37 seconds  Protime-INR     Status: Abnormal   Collection Time: 11/18/15  4:40 AM  Result Value Ref Range   Prothrombin Time 17.8 (H) 11.6 - 15.2 seconds   INR 1.46 0.00 - 1.49  CBC with Differential/Platelet     Status: Abnormal   Collection Time: 11/18/15  4:40 AM  Result Value Ref Range   WBC 8.4 4.0 - 10.5 K/uL   RBC 4.23 4.22 - 5.81 MIL/uL   Hemoglobin 12.9 (L) 13.0 - 17.0 g/dL   HCT 38.4 (L)  39.0 - 52.0 %   MCV 90.8 78.0 - 100.0 fL   MCH 30.5 26.0 - 34.0 pg   MCHC 33.6 30.0 - 36.0 g/dL   RDW 14.1 11.5 - 15.5 %   Platelets 287 150 - 400 K/uL   Neutrophils Relative % 83 %   Lymphocytes Relative 11 %   Monocytes Relative 5 %   Eosinophils Relative 1 %   Basophils Relative 0 %   Neutro Abs 7.0 1.7 - 7.7 K/uL   Lymphs Abs 0.9 0.7 - 4.0 K/uL   Monocytes Absolute 0.4 0.1 - 1.0 K/uL   Eosinophils Absolute 0.1 0.0 - 0.7 K/uL   Basophils Absolute 0.0 0.0 - 0.1 K/uL   WBC Morphology MILD LEFT SHIFT (1-5% METAS, OCC MYELO, OCC BANDS)     Comment: TOXIC GRANULATION ATYPICAL LYMPHOCYTES   Lactic acid, plasma     Status: None   Collection Time: 11/18/15  4:40 AM    Result Value Ref Range   Lactic Acid, Venous 1.5 0.5 - 2.0 mmol/L  Glucose, capillary     Status: None   Collection Time: 11/18/15  8:17 AM  Result Value Ref Range   Glucose-Capillary 88 65 - 99 mg/dL    IMAGING: Ct Abdomen Pelvis Wo Contrast  11/17/2015  CLINICAL DATA:  Acute abdominal pain, nausea and vomiting for 2 days. Elevated lactate. EXAM: CT ABDOMEN AND PELVIS WITHOUT CONTRAST TECHNIQUE: Multidetector CT imaging of the abdomen and pelvis was performed following the standard protocol without IV contrast. COMPARISON:  12/08/2013 FINDINGS: Lower chest:  Minor bibasilar atelectasis versus scarring. Hepatobiliary: Scattered areas of portal venous gas throughout the right and left hepatic lobes. No biliary dilatation. Incidental gallstones layer in the gallbladder, image 39. Pancreas: Diffusely thin atrophic. No ductal dilatation or focal abnormality by noncontrast imaging. Spleen: Within normal limits in size. Adrenals/Urinary Tract: Normal adrenal glands. No hydronephrosis or obstructing ureteral calculus on either side. Ureters are decompressed and symmetric. Left kidney demonstrates a hypodense 2 cm renal cyst in the lower pole medially, image 47. Upper pole left renal cyst anteriorly measures 10 mm, image 34. Stomach/Bowel: Stomach is moderately distended. Beginning with the fourth portion of the duodenum and involving several dilated loops of jejunum there is extensive small bowel pneumatosis throughout the entire abdomen. There is some sparing of the distal ileum. There is diffuse edema throughout the central mesentery and scattered foci of central mesenteric venous gas noted throughout the mesenteric veins. Findings compatible with small bowel ischemia and associated ileus. Colon is relatively decompressed but contains air. Minor scattered colonic diverticulosis most pronounced in the sigmoid. No fluid collection or abscess. No definite pneumoperitoneum. Vascular/Lymphatic: Extensive  atherosclerosis of the aorta and abdominal vasculature. No aneurysm or retroperitoneal hemorrhage. No significant adenopathy. Reproductive: No mass or other significant abnormality. Other: Right hip fixation hardware creates artifact. No inguinal abnormality or hernia. Small fat containing umbilical hernia evident. Postop changes from inguinal hernia repairs without recurrence. Musculoskeletal: Bones are osteopenic. Degenerative changes of the spine. No acute osseous finding. IMPRESSION: Diffuse extensive small bowel pneumatosis with mild dilatation throughout the abdomen and pelvis involving the distal duodenum and the jejunum with some sparing of the ileum compatible with small bowel ischemia. Diffuse central mesenteric and portal venous gas with central mesenteric edema/congestion. Associated diffuse small bowel ileus Extensive abdominal and aortic atherosclerosis No significant pneumoperitoneum or abscess Cholelithiasis Colonic diverticulosis These results were called by telephone at the time of interpretation on 11/17/2015 at 3:15 pm to Dr. Ezequiel Essex , who  verbally acknowledged these results. Electronically Signed   By: Jerilynn Mages.  Shick M.D.   On: 11/17/2015 15:19   Portable Chest Xray  11/18/2015  CLINICAL DATA:  Respiratory failure EXAM: PORTABLE CHEST 1 VIEW COMPARISON:  Chest radiograph from one day prior. FINDINGS: Endotracheal tube tip is 2.0 cm above the carina. Enteric tube enters the stomach with the tip not seen on this image. Right internal jugular central venous catheter terminates at the cavoatrial junction. Stable cardiomediastinal silhouette with normal heart size. No pneumothorax. Trace right pleural effusion. No overt pulmonary edema. Hazy opacities at the right greater than left lung bases, slightly worsened bilaterally. IMPRESSION: 1. Well-positioned support hardware as described . 2. Slightly worsened hazy bibasilar lung opacities, right greater than left, favor atelectasis, cannot exclude  a developing pneumonia. Electronically Signed   By: Ilona Sorrel M.D.   On: 11/18/2015 09:56   Portable Chest Xray  11/17/2015  CLINICAL DATA:  Acute respiratory failure with hypoxemia EXAM: PORTABLE CHEST 1 VIEW COMPARISON:  Chest radiograph from earlier today. FINDINGS: Endotracheal tube tip is 4 cm above the carina. Enteric tube terminates in the proximal stomach. Right internal jugular central venous catheter terminates in the middle third of the superior vena cava. Stable cardiomediastinal silhouette with normal heart size. No pneumothorax. No pleural effusion. No pulmonary edema. Mild right basilar scarring versus atelectasis. No acute consolidative airspace disease. IMPRESSION: Well-positioned support structures as described. Mild scarring versus atelectasis at the right lung base. Electronically Signed   By: Ilona Sorrel M.D.   On: 11/17/2015 18:48   Dg Chest Portable 1 View  11/17/2015  CLINICAL DATA:  Shortness of breath, decreased blood pressure in O2 sats. Cough. Aspiration? EXAM: PORTABLE CHEST 1 VIEW COMPARISON:  Chest x-ray dated 12/08/2013. FINDINGS: Cardiomediastinal silhouette is stable in size and configuration. Probable mild scarring at the right lung base. Lungs otherwise clear. No pleural effusion or pneumothorax seen. Osseous and soft tissue structures about the chest are unremarkable. IMPRESSION: No active disease. Electronically Signed   By: Franki Cabot M.D.   On: 11/17/2015 14:49   Dg Abd Portable 1v  11/17/2015  CLINICAL DATA:  Severe lower abdominal pain.  GI bleed. EXAM: PORTABLE ABDOMEN - 1 VIEW COMPARISON:  12/08/2013 CT abdomen/pelvis. FINDINGS: There is prominent pneumatosis involving mildly dilated small bowel loops throughout the abdomen. Surgical sutures are noted in the left mid abdomen. No gross pneumoperitoneum on this supine image. Moderate stool in the rectum. Partially visualized left proximal femur hardware. IMPRESSION: Severe pneumatosis involving mildly dilated  small bowel loops throughout the abdomen. Findings are worrisome for diffuse small bowel ischemia. Recommend correlation with CT abdomen/pelvis with IV contrast. These results were called by telephone at the time of interpretation on 11/17/2015 at 2:48 pm to Dr. Ezequiel Essex , who verbally acknowledged these results. Electronically Signed   By: Ilona Sorrel M.D.   On: 11/17/2015 14:50    HOSPITAL DIAGNOSES: Active Problems:   Pressure ulcer   Septic shock (HCC)   Complete heart block (HCC)   IMPRESSION: 1. Intermittent complete heart block 2. Septic shock secondary to probable ischemic bowel  RECOMMENDATION: 1. Mr. Curenton is critically ill with ischemic bowel and significant lactic acidosis of 12 on admission. He is in acute renal failure with a creatinine of 5.96. Potassium is 4.8 and magnesium is 2.1. All of these are likely contributing to intermittent complete heart block. Fortunately he is currently back in what appears to be a sinus tachycardia. I would avoid any negative chronotropic such  as beta blockers to avoid additional heart block. We will obtain a 2-D echocardiogram to evaluate LV function. Check troponins to rule out MI, however, and his current condition is not a candidate for catheterization. Should he develop recurrent heart block, consider dopamine in addition to levophed for augmented rate.  Cardiology will follow with you closely. Thanks again for the consultation.  Time Spent Directly with Patient: 45 minutes  CRITICAL CARE:  The patient is critically ill with multi-organ system failure and requires high complexity decision making for assessment and support, frequent evaluation and titration of therapies, application of advanced monitoring technologies and extensive interpretation of multiple databases.  Pixie Casino, MD, [facility] Attending Cardiologist [place] 11/18/2015, 11:42 AM

## 2015-11-18 NOTE — Progress Notes (Signed)
ANTICOAGULATION CONSULT NOTE - Initial Consult  Pharmacy Consult for heparin Indication: chest pain/ACS  Allergies  Allergen Reactions  . Codeine Nausea Only    flushing  . Depakote [Divalproex Sodium] Other (See Comments)    Makes me crazy    Patient Measurements: Height: 5\' 10"  (177.8 cm) Weight: 168 lb 10.4 oz (76.5 kg) IBW/kg (Calculated) : 73  Vital Signs: Temp: 100.7 F (38.2 C) (03/18 1155) Temp Source: Oral (03/18 1155) BP: 93/64 mmHg (03/18 1330) Pulse Rate: 101 (03/18 1330)  Labs:  Recent Labs  11/17/15 1400 11/17/15 1418 11/17/15 1709 11/17/15 2000 11/18/15 0440 11/18/15 1205  HGB 15.8 16.0 12.9* 13.1 12.9*  --   HCT 47.7 47.0 38.0* 39.2 38.4*  --   PLT 338  --   --  218 287  --   APTT 25  --   --   --  31  --   LABPROT 16.2*  --   --  17.7* 17.8*  --   INR 1.29  --   --  1.45 1.46  --   CREATININE 7.47* 6.70*  --  6.23* 5.96*  --   TROPONINI 0.10*  --   --   --   --  1.18*    Estimated Creatinine Clearance: 12.4 mL/min (by C-G formula based on Cr of 5.96).  Assessment: 65 yo male presenting with 3 day shx of abdominal pain N/V, constopation, fevers, chills. Now with troponin bump, possible NSTEMI   PMH: Small bowel resection/perforation  AC: now IV hep for NSTEMI - no bolus, hold 4 hrs prior to OR  Renal: AKI, SCr 5.96  Heme: H&H 12.9/38.4, Plt 287  Patient has open abdomen return to OR for closure tomorrow. RN Aware to turn off heparin 4 hrs prior to OR  Goal of Therapy:  Heparin level 0.3-0.7 units/ml Monitor platelets by anticoagulation protocol: Yes   Plan:  F/U OR - return planned Sunday 3/19 Heparin 900 units/hr 2100 HL Daily HL, CBC RN aware to stop 4 hrs prior to [place], PharmD, BCPS, [facility] Clinical Pharmacist Pager [phone] 11/18/2015 2:13 PM

## 2015-11-18 NOTE — Progress Notes (Signed)
1 Day Post-Op  Subjective: Patient is intubated but responsive to commands On Levophed, Fentanyl, Propofol BP remains soft  Objective: Vital signs in last 24 hours: Temp:  [96.9 F (36.1 C)-99.7 F (37.6 C)] 99.7 F (37.6 C) (03/18 0329) Pulse Rate:  [29-103] 103 (03/18 0710) Resp:  [10-29] 18 (03/18 0710) BP: (82-116)/(57-83) 86/57 mmHg (03/18 0700) SpO2:  [84 %-100 %] 100 % (03/18 0710) Arterial Line BP: (75-125)/(41-71) 112/63 mmHg (03/18 0710) FiO2 (%):  [40 %] 40 % (03/18 0335) Weight:  [76.5 kg (168 lb 10.4 oz)] 76.5 kg (168 lb 10.4 oz) (03/17 1930)    Intake/Output from previous day: 03/17 0701 - 03/18 0700 In: 9227.6 [I.V.:5367.6; IV Piggyback:3860] Out: 2580 [Urine:530; Emesis/NG output:500; Drains:800; Blood:50] Intake/Output this shift:    General appearance: intubated, sedated but responsive Resp: rhonchi bilaterally Cardio: tachycardic GI: soft, open abd with VAC in place VAC with good seal  Lab Results:   Recent Labs  11/17/15 2000 11/18/15 0440  WBC 6.1 8.4  HGB 13.1 12.9*  HCT 39.2 38.4*  PLT 218 287   BMET  Recent Labs  11/17/15 2000 11/18/15 0440  NA 147* 145  K 3.9 4.8  CL 104 108  CO2 24 23  GLUCOSE 137* 112*  BUN 57* 60*  CREATININE 6.23* 5.96*  CALCIUM 6.5* 5.8*   PT/INR  Recent Labs  11/17/15 2000 11/18/15 0440  LABPROT 17.7* 17.8*  INR 1.45 1.46   ABG  Recent Labs  11/17/15 1908 11/18/15 0415  PHART 7.447 7.410  HCO3 33.5* 23.9    Studies/Results: Ct Abdomen Pelvis Wo Contrast  11/17/2015  CLINICAL DATA:  Acute abdominal pain, nausea and vomiting for 2 days. Elevated lactate. EXAM: CT ABDOMEN AND PELVIS WITHOUT CONTRAST TECHNIQUE: Multidetector CT imaging of the abdomen and pelvis was performed following the standard protocol without IV contrast. COMPARISON:  12/08/2013 FINDINGS: Lower chest:  Minor bibasilar atelectasis versus scarring. Hepatobiliary: Scattered areas of portal venous gas throughout the right and  left hepatic lobes. No biliary dilatation. Incidental gallstones layer in the gallbladder, image 39. Pancreas: Diffusely thin atrophic. No ductal dilatation or focal abnormality by noncontrast imaging. Spleen: Within normal limits in size. Adrenals/Urinary Tract: Normal adrenal glands. No hydronephrosis or obstructing ureteral calculus on either side. Ureters are decompressed and symmetric. Left kidney demonstrates a hypodense 2 cm renal cyst in the lower pole medially, image 47. Upper pole left renal cyst anteriorly measures 10 mm, image 34. Stomach/Bowel: Stomach is moderately distended. Beginning with the fourth portion of the duodenum and involving several dilated loops of jejunum there is extensive small bowel pneumatosis throughout the entire abdomen. There is some sparing of the distal ileum. There is diffuse edema throughout the central mesentery and scattered foci of central mesenteric venous gas noted throughout the mesenteric veins. Findings compatible with small bowel ischemia and associated ileus. Colon is relatively decompressed but contains air. Minor scattered colonic diverticulosis most pronounced in the sigmoid. No fluid collection or abscess. No definite pneumoperitoneum. Vascular/Lymphatic: Extensive atherosclerosis of the aorta and abdominal vasculature. No aneurysm or retroperitoneal hemorrhage. No significant adenopathy. Reproductive: No mass or other significant abnormality. Other: Right hip fixation hardware creates artifact. No inguinal abnormality or hernia. Small fat containing umbilical hernia evident. Postop changes from inguinal hernia repairs without recurrence. Musculoskeletal: Bones are osteopenic. Degenerative changes of the spine. No acute osseous finding. IMPRESSION: Diffuse extensive small bowel pneumatosis with mild dilatation throughout the abdomen and pelvis involving the distal duodenum and the jejunum with some sparing of the  ileum compatible with small bowel ischemia.  Diffuse central mesenteric and portal venous gas with central mesenteric edema/congestion. Associated diffuse small bowel ileus Extensive abdominal and aortic atherosclerosis No significant pneumoperitoneum or abscess Cholelithiasis Colonic diverticulosis These results were called by telephone at the time of interpretation on 11/17/2015 at 3:15 pm to Dr. Ezequiel Essex , who verbally acknowledged these results. Electronically Signed   By: Jerilynn Mages.  Shick M.D.   On: 11/17/2015 15:19   Portable Chest Xray  11/17/2015  CLINICAL DATA:  Acute respiratory failure with hypoxemia EXAM: PORTABLE CHEST 1 VIEW COMPARISON:  Chest radiograph from earlier today. FINDINGS: Endotracheal tube tip is 4 cm above the carina. Enteric tube terminates in the proximal stomach. Right internal jugular central venous catheter terminates in the middle third of the superior vena cava. Stable cardiomediastinal silhouette with normal heart size. No pneumothorax. No pleural effusion. No pulmonary edema. Mild right basilar scarring versus atelectasis. No acute consolidative airspace disease. IMPRESSION: Well-positioned support structures as described. Mild scarring versus atelectasis at the right lung base. Electronically Signed   By: Ilona Sorrel M.D.   On: 11/17/2015 18:48   Dg Chest Portable 1 View  11/17/2015  CLINICAL DATA:  Shortness of breath, decreased blood pressure in O2 sats. Cough. Aspiration? EXAM: PORTABLE CHEST 1 VIEW COMPARISON:  Chest x-ray dated 12/08/2013. FINDINGS: Cardiomediastinal silhouette is stable in size and configuration. Probable mild scarring at the right lung base. Lungs otherwise clear. No pleural effusion or pneumothorax seen. Osseous and soft tissue structures about the chest are unremarkable. IMPRESSION: No active disease. Electronically Signed   By: Franki Cabot M.D.   On: 11/17/2015 14:49   Dg Abd Portable 1v  11/17/2015  CLINICAL DATA:  Severe lower abdominal pain.  GI bleed. EXAM: PORTABLE ABDOMEN - 1 VIEW  COMPARISON:  12/08/2013 CT abdomen/pelvis. FINDINGS: There is prominent pneumatosis involving mildly dilated small bowel loops throughout the abdomen. Surgical sutures are noted in the left mid abdomen. No gross pneumoperitoneum on this supine image. Moderate stool in the rectum. Partially visualized left proximal femur hardware. IMPRESSION: Severe pneumatosis involving mildly dilated small bowel loops throughout the abdomen. Findings are worrisome for diffuse small bowel ischemia. Recommend correlation with CT abdomen/pelvis with IV contrast. These results were called by telephone at the time of interpretation on 11/17/2015 at 2:48 pm to Dr. Ezequiel Essex , who verbally acknowledged these results. Electronically Signed   By: Ilona Sorrel M.D.   On: 11/17/2015 14:50    Anti-infectives: Anti-infectives    Start     Dose/Rate Route Frequency Ordered Stop   11/18/15 1800  anidulafungin (ERAXIS) 100 mg in sodium chloride 0.9 % 100 mL IVPB     100 mg over 90 Minutes Intravenous Every 24 hours 11/17/15 1812     11/17/15 2200  piperacillin-tazobactam (ZOSYN) IVPB 2.25 g     2.25 g 100 mL/hr over 30 Minutes Intravenous 3 times per day 11/17/15 1930     11/17/15 1830  anidulafungin (ERAXIS) 200 mg in sodium chloride 0.9 % 200 mL IVPB     200 mg over 180 Minutes Intravenous  Once 11/17/15 1812 11/17/15 2203   11/17/15 1815  vancomycin (VANCOCIN) 1,500 mg in sodium chloride 0.9 % 500 mL IVPB     1,500 mg 250 mL/hr over 120 Minutes Intravenous  Once 11/17/15 1813 11/17/15 2103   11/17/15 1530  piperacillin-tazobactam (ZOSYN) IVPB 3.375 g     3.375 g 12.5 mL/hr over 240 Minutes Intravenous NOW 11/17/15 1521 11/17/15 1942  11/17/15 1515  piperacillin-tazobactam (ZOSYN) IVPB 4.5 g  Status:  Discontinued     4.5 g 200 mL/hr over 30 Minutes Intravenous  Once 11/17/15 1505 11/18/15 0040      Assessment/Plan: s/p Procedure(s): EXPLORATORY LAPAROTOMY (N/A) Etiology of pneumatosis unclear  Could be  mucosal ischemia without full thickness necrosis Would support hemodynamics to optimize perfusion Will return to OR probably Sunday to reinspect bowel and hopefully close abdomen. Discussed with Dr. Lamonte Sakai   LOS: 1 day    Arya Boxley K. 11/18/2015

## 2015-11-19 ENCOUNTER — Inpatient Hospital Stay (HOSPITAL_COMMUNITY): Payer: Medicare Other

## 2015-11-19 ENCOUNTER — Inpatient Hospital Stay (HOSPITAL_COMMUNITY): Payer: Medicare Other | Admitting: Anesthesiology

## 2015-11-19 ENCOUNTER — Encounter (HOSPITAL_COMMUNITY)
Admission: EM | Disposition: A | Discharge status: Skilled Nursing Facility | Payer: Self-pay | Source: Home / Self Care | Attending: Internal Medicine

## 2015-11-19 ENCOUNTER — Other Ambulatory Visit: Payer: Self-pay

## 2015-11-19 DIAGNOSIS — I255 Ischemic cardiomyopathy: Secondary | ICD-10-CM | POA: Diagnosis present

## 2015-11-19 DIAGNOSIS — K559 Vascular disorder of intestine, unspecified: Secondary | ICD-10-CM

## 2015-11-19 HISTORY — PX: BOWEL RESECTION: SHX1257

## 2015-11-19 HISTORY — PX: LAPAROTOMY: SHX154

## 2015-11-19 LAB — GLUCOSE, CAPILLARY
GLUCOSE-CAPILLARY: 101 mg/dL — AB (ref 65–99)
GLUCOSE-CAPILLARY: 119 mg/dL — AB (ref 65–99)
Glucose-Capillary: 96 mg/dL (ref 65–99)
Glucose-Capillary: 104 mg/dL — ABNORMAL HIGH (ref 65–99)
Glucose-Capillary: 124 mg/dL — ABNORMAL HIGH (ref 65–99)
Glucose-Capillary: 110 mg/dL — ABNORMAL HIGH (ref 65–99)

## 2015-11-19 LAB — PHOSPHORUS: Phosphorus: 4.9 mg/dL — ABNORMAL HIGH (ref 2.5–4.6)

## 2015-11-19 LAB — BASIC METABOLIC PANEL
ANION GAP: 16 — AB (ref 5–15)
BUN: 61 mg/dL — ABNORMAL HIGH (ref 6–20)
CALCIUM: 6 mg/dL — AB (ref 8.9–10.3)
CO2: 20 mmol/L — AB (ref 22–32)
Chloride: 108 mmol/L (ref 101–111)
Creatinine, Ser: 4.67 mg/dL — ABNORMAL HIGH (ref 0.61–1.24)
GFR calc Af Amer: 14 mL/min — ABNORMAL LOW (ref >60)
GFR calc non Af Amer: 12 mL/min — ABNORMAL LOW (ref >60)
GLUCOSE: 130 mg/dL — AB (ref 65–99)
Potassium: 4.2 mmol/L (ref 3.5–5.1)
Sodium: 144 mmol/L (ref 135–145)

## 2015-11-19 LAB — CBC
HEMATOCRIT: 36 % — AB (ref 39.0–52.0)
Hemoglobin: 11.7 g/dL — ABNORMAL LOW (ref 13.0–17.0)
MCH: 29.5 pg (ref 26.0–34.0)
MCHC: 32.5 g/dL (ref 30.0–36.0)
MCV: 90.9 fL (ref 78.0–100.0)
PLATELETS: 219 10*3/uL (ref 150–400)
RBC: 3.96 MIL/uL — ABNORMAL LOW (ref 4.22–5.81)
RDW: 14 % (ref 11.5–15.5)
WBC: 8.8 10*3/uL (ref 4.0–10.5)

## 2015-11-19 LAB — POCT I-STAT 3, ART BLOOD GAS (G3+)
ACID-BASE DEFICIT: 4 mmol/L — AB (ref 0.0–2.0)
Acid-base deficit: 2 mmol/L (ref 0.0–2.0)
BICARBONATE: 21.7 meq/L (ref 20.0–24.0)
Bicarbonate: 22.8 mEq/L (ref 20.0–24.0)
O2 SAT: 99 %
O2 Saturation: 94 %
PCO2 ART: 38 mmHg (ref 35.0–45.0)
PO2 ART: 81 mmHg (ref 80.0–100.0)
Patient temperature: 98.7
Patient temperature: 99.3
TCO2: 24 mmol/L (ref 0–100)
TCO2: 23 mmol/L (ref 0–100)
pCO2 arterial: 43.2 mmHg (ref 35.0–45.0)
pH, Arterial: 7.387 (ref 7.350–7.450)
pH, Arterial: 7.311 — ABNORMAL LOW (ref 7.350–7.450)
pO2, Arterial: 123 mmHg — ABNORMAL HIGH (ref 80.0–100.0)

## 2015-11-19 LAB — TROPONIN I
TROPONIN I: 0.62 ng/mL — AB (ref <0.031)
TROPONIN I: 0.82 ng/mL — AB (ref <0.031)
Troponin I: 0.63 ng/mL (ref <0.031)

## 2015-11-19 LAB — LACTIC ACID, PLASMA: LACTIC ACID, VENOUS: 1.3 mmol/L (ref 0.5–2.0)

## 2015-11-19 LAB — VANCOMYCIN, RANDOM: VANCOMYCIN RM: 10 ug/mL

## 2015-11-19 LAB — HEPARIN LEVEL (UNFRACTIONATED): HEPARIN UNFRACTIONATED: 0.18 [IU]/mL — AB (ref 0.30–0.70)

## 2015-11-19 LAB — MAGNESIUM: MAGNESIUM: 2.2 mg/dL (ref 1.7–2.4)

## 2015-11-19 SURGERY — LAPAROTOMY, EXPLORATORY
Anesthesia: General

## 2015-11-19 MED ORDER — ASPIRIN 81 MG PO CHEW
81.0000 mg | CHEWABLE_TABLET | Freq: Every day | ORAL | Status: DC
  Administered 2015-11-19 – 2015-11-26 (×8): 81 mg via NASOGASTRIC
  Filled 2015-11-19 (×7): qty 1

## 2015-11-19 MED ORDER — SODIUM CHLORIDE 0.9 % IV SOLN
INTRAVENOUS | Status: DC | PRN
  Administered 2015-11-19: 09:00:00 via INTRAVENOUS

## 2015-11-19 MED ORDER — EPINEPHRINE HCL 1 MG/ML IJ SOLN
INTRAMUSCULAR | Status: DC | PRN
  Administered 2015-11-19: .2 mg via INTRAVENOUS

## 2015-11-19 MED ORDER — ATROPINE SULFATE 1 MG/ML IJ SOLN
INTRAMUSCULAR | Status: DC | PRN
  Administered 2015-11-19 (×2): .5 mg via INTRAVENOUS

## 2015-11-19 MED ORDER — ATROPINE SULFATE 0.1 MG/ML IJ SOLN
INTRAMUSCULAR | Status: AC
  Filled 2015-11-19: qty 10

## 2015-11-19 MED ORDER — MIDAZOLAM HCL 2 MG/2ML IJ SOLN
INTRAMUSCULAR | Status: AC
  Filled 2015-11-19: qty 4

## 2015-11-19 MED ORDER — ROCURONIUM BROMIDE 100 MG/10ML IV SOLN
INTRAVENOUS | Status: DC | PRN
  Administered 2015-11-19: 50 mg via INTRAVENOUS

## 2015-11-19 MED ORDER — WHITE PETROLATUM GEL
Status: AC
  Administered 2015-11-19: 0.2
  Filled 2015-11-19: qty 1

## 2015-11-19 MED ORDER — MIDAZOLAM HCL 2 MG/2ML IJ SOLN
2.0000 mg | INTRAMUSCULAR | Status: DC | PRN
  Administered 2015-11-19 – 2015-11-26 (×8): 2 mg via INTRAVENOUS
  Filled 2015-11-19 (×7): qty 2

## 2015-11-19 MED ORDER — VANCOMYCIN HCL IN DEXTROSE 1-5 GM/200ML-% IV SOLN
1000.0000 mg | INTRAVENOUS | Status: DC
  Administered 2015-11-19 – 2015-11-23 (×5): 1000 mg via INTRAVENOUS
  Filled 2015-11-19 (×8): qty 200

## 2015-11-19 MED ORDER — WHITE PETROLATUM GEL
Status: DC | PRN
  Administered 2015-11-21: 0.2 via TOPICAL
  Filled 2015-11-19 (×3): qty 28.35

## 2015-11-19 MED ORDER — MIDAZOLAM HCL 2 MG/2ML IJ SOLN
INTRAMUSCULAR | Status: AC
  Filled 2015-11-19: qty 2

## 2015-11-19 MED ORDER — MIDAZOLAM HCL 2 MG/2ML IJ SOLN
INTRAMUSCULAR | Status: DC | PRN
  Administered 2015-11-19: 4 mg via INTRAVENOUS

## 2015-11-19 MED ORDER — EPINEPHRINE HCL 1 MG/ML IJ SOLN
0.5000 ug/min | INTRAMUSCULAR | Status: AC
  Administered 2015-11-19: 4 ug/min via INTRAVENOUS
  Filled 2015-11-19: qty 4

## 2015-11-19 MED ORDER — EPINEPHRINE HCL 0.1 MG/ML IJ SOSY
PREFILLED_SYRINGE | INTRAMUSCULAR | Status: AC
  Filled 2015-11-19: qty 10

## 2015-11-19 MED ORDER — 0.9 % SODIUM CHLORIDE (POUR BTL) OPTIME
TOPICAL | Status: DC | PRN
  Administered 2015-11-19: 3000 mL

## 2015-11-19 SURGICAL SUPPLY — 40 items
BLADE SURG ROTATE 9660 (MISCELLANEOUS) IMPLANT
CANISTER SUCTION 2500CC (MISCELLANEOUS) ×8 IMPLANT
CHLORAPREP W/TINT 26ML (MISCELLANEOUS) ×4 IMPLANT
COVER SURGICAL LIGHT HANDLE (MISCELLANEOUS) ×4 IMPLANT
DRAPE LAPAROSCOPIC ABDOMINAL (DRAPES) ×4 IMPLANT
DRAPE WARM FLUID 44X44 (DRAPE) IMPLANT
DRSG OPSITE POSTOP 4X10 (GAUZE/BANDAGES/DRESSINGS) IMPLANT
DRSG OPSITE POSTOP 4X8 (GAUZE/BANDAGES/DRESSINGS) IMPLANT
ELECT BLADE 6.5 EXT (BLADE) IMPLANT
ELECT CAUTERY BLADE 6.4 (BLADE) IMPLANT
ELECT REM PT RETURN 9FT ADLT (ELECTROSURGICAL) ×4
ELECTRODE REM PT RTRN 9FT ADLT (ELECTROSURGICAL) ×2 IMPLANT
GLOVE BIO SURGEON STRL SZ7.5 (GLOVE) ×4 IMPLANT
GLOVE BIOGEL PI IND STRL 8 (GLOVE) ×2 IMPLANT
GLOVE BIOGEL PI INDICATOR 8 (GLOVE) ×2
GOWN STRL REUS W/ TWL LRG LVL3 (GOWN DISPOSABLE) ×2 IMPLANT
GOWN STRL REUS W/ TWL XL LVL3 (GOWN DISPOSABLE) ×2 IMPLANT
GOWN STRL REUS W/TWL LRG LVL3 (GOWN DISPOSABLE) ×4
GOWN STRL REUS W/TWL XL LVL3 (GOWN DISPOSABLE) ×4
KIT BASIN OR (CUSTOM PROCEDURE TRAY) ×4 IMPLANT
KIT ROOM TURNOVER OR (KITS) ×4 IMPLANT
LIGASURE IMPACT 36 18CM CVD LR (INSTRUMENTS) ×4 IMPLANT
NS IRRIG 1000ML POUR BTL (IV SOLUTION) ×12 IMPLANT
PACK GENERAL/GYN (CUSTOM PROCEDURE TRAY) ×4 IMPLANT
PAD ARMBOARD 7.5X6 YLW CONV (MISCELLANEOUS) ×4 IMPLANT
PAD NEG PRESSURE SENSATRAC (MISCELLANEOUS) ×4 IMPLANT
SPECIMEN JAR LARGE (MISCELLANEOUS) IMPLANT
SPONGE ABDOMINAL VAC ABTHERA (MISCELLANEOUS) ×3 IMPLANT
SPONGE LAP 18X18 X RAY DECT (DISPOSABLE) IMPLANT
STAPLER VISISTAT 35W (STAPLE) ×1 IMPLANT
SUCTION POOLE TIP (SUCTIONS) ×4 IMPLANT
SUT PDS AB 1 TP1 96 (SUTURE) IMPLANT
SUT SILK 2 0 SH CR/8 (SUTURE) ×4 IMPLANT
SUT SILK 2 0 TIES 10X30 (SUTURE) IMPLANT
SUT SILK 3 0 SH CR/8 (SUTURE) ×4 IMPLANT
SUT SILK 3 0 TIES 10X30 (SUTURE) ×4 IMPLANT
TOWEL OR 17X24 6PK STRL BLUE (TOWEL DISPOSABLE) ×4 IMPLANT
TOWEL OR 17X26 10 PK STRL BLUE (TOWEL DISPOSABLE) ×4 IMPLANT
TRAY FOLEY CATH 16FRSI W/METER (SET/KITS/TRAYS/PACK) IMPLANT
YANKAUER SUCT BULB TIP NO VENT (SUCTIONS) IMPLANT

## 2015-11-19 NOTE — Progress Notes (Signed)
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 Transient TCP. Surgery cancelled. Patient spontaneously went back to sinus rhythm this morning. Remains intubated in critical condition.  Objective:  Temp:  [98.4 F (36.9 C)-101.5 F (38.6 C)] 99.3 F (37.4 C) (03/19 0821) Pulse Rate:  [76-106] 91 (03/19 1120) Resp:  [0-22] 0 (03/19 1120) BP: (77-106)/(50-69) 105/64 mmHg (03/19 1100) SpO2:  [93 %-100 %] 99 % (03/19 1120) Arterial Line BP: (83-128)/(50-73) 123/63 mmHg (03/19 1120) FiO2 (%):  [40 %] 40 % (03/19 1010) Weight:  [180 lb 5.4 oz (81.8 kg)] 180 lb 5.4 oz (81.8 kg) (03/19 0700) Weight change: 11 lb 11 oz (5.3 kg)  Intake/Output from previous day: 03/18 0701 - 03/19 0700 In: 4930.3 [I.V.:4140.3; NG/GT:50; IV Piggyback:330] Out: 6712 [Urine:1775; Emesis/NG output:600; Drains:1000]  Intake/Output from this shift: Total I/O In: 572.2 [I.V.:542.2; Other:30] Out: 195 [Urine:170; Blood:25]  Medications: Current Facility-Administered Medications  Medication Dose Route Frequency Provider Last Rate Last Dose  . acetaminophen (TYLENOL) suppository 650 mg  650 mg Rectal Q6H PRN Javier Glazier, MD   650 mg at 11/18/15 1652  . anidulafungin (ERAXIS) 100 mg in sodium chloride 0.9 % 100 mL IVPB  100 mg Intravenous Q24H Darnell Level Mancheril, RPH   100 mg at 11/18/15 1748  . antiseptic oral rinse solution (CORINZ)  7 mL Mouth Rinse QID Juanito Doom, MD   7 mL at 11/19/15 1200  . chlorhexidine gluconate (PERIDEX) 0.12 % solution 15 mL  15 mL Mouth Rinse BID Juanito Doom, MD   15 mL at 11/19/15 0800  . dextrose 5 %-0.45 % sodium chloride infusion   Intravenous Continuous Javier Glazier, MD 100 mL/hr at 11/19/15 1100    . fentaNYL (SUBLIMAZE) 2,500 mcg in sodium chloride 0.9 % 250 mL (10 mcg/mL) infusion  25-400 mcg/hr Intravenous Continuous Greer Pickerel, MD 10 mL/hr at 11/19/15 1100 100 mcg/hr at  11/19/15 1100  . fentaNYL (SUBLIMAZE) bolus via infusion 25 mcg  25 mcg Intravenous Q1H PRN Greer Pickerel, MD      . fentaNYL (SUBLIMAZE) injection 50 mcg  50 mcg Intravenous Once Juanito Doom, MD   50 mcg at 11/17/15 2015  . fentaNYL (SUBLIMAZE) injection 50 mcg  50 mcg Intravenous Once Greer Pickerel, MD   50 mcg at 11/17/15 2000  . heparin ADULT infusion 100 units/mL (25000 units/250 mL)  1,150 Units/hr Intravenous Continuous Lauren D Bajbus, RPH 11.5 mL/hr at 11/19/15 1100 1,150 Units/hr at 11/19/15 1100  . insulin aspart (novoLOG) injection 0-9 Units  0-9 Units Subcutaneous 6 times per day Juanito Doom, MD   1 Units at 11/17/15 2340  . norepinephrine (LEVOPHED) 16 mg in dextrose 5 % 250 mL (0.064 mg/mL) infusion  2-50 mcg/min Intravenous Continuous Juanito Doom, MD 16.9 mL/hr at 11/19/15 1100 18.027 mcg/min at 11/19/15 1100  . ondansetron (ZOFRAN-ODT) disintegrating tablet 4 mg  4 mg Oral Q6H PRN Greer Pickerel, MD       Or  . ondansetron [facility]) injection 4 mg  4 mg Intravenous Q6H PRN Greer Pickerel, MD      . pantoprazole (PROTONIX) injection 40 mg  40 mg Intravenous QHS Greer Pickerel, MD   40 mg at 11/18/15 2149  . piperacillin-tazobactam (ZOSYN) IVPB 2.25 g  2.25 g Intravenous 3 times per day [name], RPH   2.25 g at 11/19/15 0506  . propofol (DIPRIVAN) 1000 MG/100ML infusion  0-50 mcg/kg/min Intravenous Continuous Greer Pickerel, MD   Stopped at 11/19/15 1017    Physical Exam: General appearance: intubated, sedated on vent Neck: no carotid bruit and no JVD Lungs: diminished breath sounds bilaterally Heart: regular rate and rhythm Abdomen: post-surgical Extremities: extremities normal, atraumatic, no cyanosis or edema Pulses: 2+ and symmetric Skin: pale, cool, dryu Neurologic: Mental status: sedated on vent .  Lab Results: Results for orders placed or performed during the hospital encounter of 11/17/15 (from the past 48 hour(s))  Comprehensive metabolic panel      Status: Abnormal   Collection Time: 11/17/15  2:00 PM  Result Value Ref Range   Sodium 154 (H) 135 - 145 mmol/L   Potassium 3.7 3.5 - 5.1 mmol/L   Chloride 95 (L) 101 - 111 mmol/L   CO2 27 22 - 32 mmol/L   Glucose, Bld 139 (H) 65 - 99 mg/dL   BUN 58 (H) 6 - 20 mg/dL   Creatinine, Ser 7.47 (H) 0.61 - 1.24 mg/dL   Calcium 8.1 (L) 8.9 - 10.3 mg/dL   Total Protein 5.6 (L) 6.5 - 8.1 g/dL   Albumin 2.8 (L) 3.5 - 5.0 g/dL   AST 39 15 - 41 U/L   ALT 13 (L) 17 - 63 U/L   Alkaline Phosphatase 106 38 - 126 U/L   Total Bilirubin 0.6 0.3 - 1.2 mg/dL   GFR calc non Af Amer 7 (L) >60 mL/min   GFR calc Af Amer 8 (L) >60 mL/min    Comment: (NOTE) The eGFR has been calculated using the CKD EPI equation. This calculation has not been validated in all clinical situations. eGFR's persistently <60 mL/min signify possible Chronic Kidney Disease.    Anion gap 32 (H) 5 - 15  CBC WITH DIFFERENTIAL     Status: Abnormal   Collection Time: 11/17/15  2:00 PM  Result Value Ref Range   WBC 11.1 (H) 4.0 - 10.5 K/uL   RBC 5.12 4.22 - 5.81 MIL/uL   Hemoglobin 15.8 13.0 - 17.0 g/dL   HCT 47.7 39.0 - 52.0 %   MCV 93.2 78.0 - 100.0 fL   MCH 30.9 26.0 - 34.0 pg   MCHC 33.1 30.0 - 36.0 g/dL   RDW 14.0 11.5 - 15.5 %   Platelets 338 150 - 400 K/uL   Neutrophils Relative % 77 %   Lymphocytes Relative 13 %   Monocytes Relative 9 %   Eosinophils Relative 0 %   Basophils Relative 1 %   Neutro Abs 8.6 (H) 1.7 - 7.7 K/uL   Lymphs Abs 1.4 0.7 - 4.0 K/uL   Monocytes Absolute 1.0 0.1 - 1.0 K/uL   Eosinophils Absolute 0.0 0.0 - 0.7 K/uL   Basophils Absolute 0.1 0.0 - 0.1 K/uL   RBC Morphology POLYCHROMASIA PRESENT    WBC Morphology TOXIC GRANULATION     Comment: MILD LEFT SHIFT (1-5% METAS, OCC MYELO, OCC BANDS) ATYPICAL LYMPHOCYTES   APTT     Status: None   Collection Time: 11/17/15  2:00 PM  Result Value Ref Range   aPTT 25 24 - 37 seconds  Protime-INR     Status: Abnormal   Collection Time: 11/17/15  2:00  PM  Result Value Ref Range   Prothrombin Time 16.2 (H) 11.6 - 15.2 seconds   INR 1.29 0.00 - 1.49  Lipase, blood     Status: None   Collection Time: 11/17/15  2:00 PM  Result Value Ref Range   Lipase 13 11 -  51 U/L  Troponin I     Status: Abnormal   Collection Time: 11/17/15  2:00 PM  Result Value Ref Range   Troponin I 0.10 (H) <0.031 ng/mL    Comment:        PERSISTENTLY INCREASED TROPONIN VALUES IN THE RANGE OF 0.04-0.49 ng/mL CAN BE SEEN IN:       -UNSTABLE ANGINA       -CONGESTIVE HEART FAILURE       -MYOCARDITIS       -CHEST TRAUMA       -ARRYHTHMIAS       -LATE PRESENTING MYOCARDIAL INFARCTION       -COPD   CLINICAL FOLLOW-UP RECOMMENDED.   Type and screen Helen     Status: None   Collection Time: 11/17/15  2:17 PM  Result Value Ref Range   ABO/RH(D) A POS    Antibody Screen NEG    Sample Expiration 11/20/2015   I-Stat Chem 8, ED  (not at [facility])     Status: Abnormal   Collection Time: 11/17/15  2:18 PM  Result Value Ref Range   Sodium 145 135 - 145 mmol/L   Potassium 3.3 (L) 3.5 - 5.1 mmol/L   Chloride 97 (L) 101 - 111 mmol/L   BUN 59 (H) 6 - 20 mg/dL   Creatinine, Ser 6.70 (H) 0.61 - 1.24 mg/dL   Glucose, Bld 131 (H) 65 - 99 mg/dL   Calcium, Ion 0.84 (L) 1.13 - 1.30 mmol/L   TCO2 29 0 - 100 mmol/L   Hemoglobin 16.0 13.0 - 17.0 g/dL   HCT 47.0 39.0 - 52.0 %  I-Stat CG4 Lactic Acid, ED  (not at [facility])     Status: Abnormal   Collection Time: 11/17/15  2:19 PM  Result Value Ref Range   Lactic Acid, Venous 12.02 (HH) 0.5 - 2.0 mmol/L   Comment NOTIFIED PHYSICIAN   Occult bld gastric/duodenum (cup to lab)     Status: Abnormal   Collection Time: 11/17/15  2:22 PM  Result Value Ref Range   pH, Gastric NOT DONE    Occult Blood, Gastric POSITIVE (A) NEGATIVE  POC occult blood, ED Provider will collect     Status: Abnormal   Collection Time: 11/17/15  2:33 PM  Result Value Ref Range   Fecal Occult Bld POSITIVE (A) NEGATIVE  I-STAT 7,  (LYTES, BLD GAS, ICA, H+H)     Status: Abnormal   Collection Time: 11/17/15  5:09 PM  Result Value Ref Range   pH, Arterial 7.372 7.350 - 7.450   pCO2 arterial 53.9 (H) 35.0 - 45.0 mmHg   pO2, Arterial 308.0 (H) 80.0 - 100.0 mmHg   Bicarbonate 31.6 (H) 20.0 - 24.0 mEq/L   TCO2 33 0 - 100 mmol/L   O2 Saturation 100.0 %   Acid-Base Excess 5.0 (H) 0.0 - 2.0 mmol/L   Sodium 145 135 - 145 mmol/L   Potassium 3.5 3.5 - 5.1 mmol/L   Calcium, Ion 0.86 (L) 1.13 - 1.30 mmol/L   HCT 38.0 (L) 39.0 - 52.0 %   Hemoglobin 12.9 (L) 13.0 - 17.0 g/dL   Patient temperature 36.0 C    Sample type ARTERIAL   Glucose, capillary     Status: None   Collection Time: 11/17/15  6:25 PM  Result Value Ref Range   Glucose-Capillary 81 65 - 99 mg/dL  MRSA PCR Screening     Status: None   Collection Time: 11/17/15  6:30 PM  Result  Value Ref Range   MRSA by PCR NEGATIVE NEGATIVE    Comment:        The GeneXpert MRSA Assay (FDA approved for NASAL specimens only), is one component of a comprehensive MRSA colonization surveillance program. It is not intended to diagnose MRSA infection nor to guide or monitor treatment for MRSA infections.   I-STAT 3, arterial blood gas (G3+)     Status: Abnormal   Collection Time: 11/17/15  7:08 PM  Result Value Ref Range   pH, Arterial 7.447 7.350 - 7.450   pCO2 arterial 48.6 (H) 35.0 - 45.0 mmHg   pO2, Arterial 102.0 (H) 80.0 - 100.0 mmHg   Bicarbonate 33.5 (H) 20.0 - 24.0 mEq/L   TCO2 35 0 - 100 mmol/L   O2 Saturation 98.0 %   Acid-Base Excess 8.0 (H) 0.0 - 2.0 mmol/L   Patient temperature 98.7 F    Collection site ARTERIAL LINE    Drawn by RT    Sample type ARTERIAL   Culture, blood (Routine X 2) w Reflex to ID Panel     Status: None (Preliminary result)   Collection Time: 11/17/15  7:24 PM  Result Value Ref Range   Specimen Description BLOOD LEFT FOREARM    Special Requests IN PEDIATRIC BOTTLE 2CC    Culture NO GROWTH < 24 HOURS    Report Status PENDING     Glucose, capillary     Status: Abnormal   Collection Time: 11/17/15  7:29 PM  Result Value Ref Range   Glucose-Capillary 103 (H) 65 - 99 mg/dL  Culture, blood (Routine X 2) w Reflex to ID Panel     Status: None (Preliminary result)   Collection Time: 11/17/15  7:35 PM  Result Value Ref Range   Specimen Description BLOOD LEFT HAND    Special Requests IN PEDIATRIC BOTTLE 2CC    Culture NO GROWTH < 24 HOURS    Report Status PENDING   CBC     Status: None   Collection Time: 11/17/15  8:00 PM  Result Value Ref Range   WBC 6.1 4.0 - 10.5 K/uL   RBC 4.30 4.22 - 5.81 MIL/uL   Hemoglobin 13.1 13.0 - 17.0 g/dL   HCT 39.2 39.0 - 52.0 %   MCV 91.2 78.0 - 100.0 fL   MCH 30.5 26.0 - 34.0 pg   MCHC 33.4 30.0 - 36.0 g/dL   RDW 13.9 11.5 - 15.5 %   Platelets 218 150 - 400 K/uL  Comprehensive metabolic panel     Status: Abnormal   Collection Time: 11/17/15  8:00 PM  Result Value Ref Range   Sodium 147 (H) 135 - 145 mmol/L   Potassium 3.9 3.5 - 5.1 mmol/L   Chloride 104 101 - 111 mmol/L   CO2 24 22 - 32 mmol/L   Glucose, Bld 137 (H) 65 - 99 mg/dL   BUN 57 (H) 6 - 20 mg/dL   Creatinine, Ser 6.23 (H) 0.61 - 1.24 mg/dL   Calcium 6.5 (L) 8.9 - 10.3 mg/dL   Total Protein 4.1 (L) 6.5 - 8.1 g/dL   Albumin 2.0 (L) 3.5 - 5.0 g/dL   AST 27 15 - 41 U/L   ALT 10 (L) 17 - 63 U/L   Alkaline Phosphatase 69 38 - 126 U/L   Total Bilirubin 0.8 0.3 - 1.2 mg/dL   GFR calc non Af Amer 8 (L) >60 mL/min   GFR calc Af Amer 10 (L) >60 mL/min    Comment: (NOTE)  The eGFR has been calculated using the CKD EPI equation. This calculation has not been validated in all clinical situations. eGFR's persistently <60 mL/min signify possible Chronic Kidney Disease.    Anion gap 19 (H) 5 - 15  Magnesium     Status: None   Collection Time: 11/17/15  8:00 PM  Result Value Ref Range   Magnesium 2.3 1.7 - 2.4 mg/dL  Phosphorus     Status: None   Collection Time: 11/17/15  8:00 PM  Result Value Ref Range   Phosphorus  4.5 2.5 - 4.6 mg/dL  Protime-INR     Status: Abnormal   Collection Time: 11/17/15  8:00 PM  Result Value Ref Range   Prothrombin Time 17.7 (H) 11.6 - 15.2 seconds   INR 1.45 0.00 - 1.49  Triglycerides     Status: Abnormal   Collection Time: 11/17/15  8:00 PM  Result Value Ref Range   Triglycerides 164 (H) <150 mg/dL  Urinalysis, Routine w reflex microscopic (not at [facility])     Status: Abnormal   Collection Time: 11/17/15  8:15 PM  Result Value Ref Range   Color, Urine RED (A) YELLOW    Comment: BIOCHEMICALS MAY BE AFFECTED BY COLOR   APPearance TURBID (A) CLEAR   Specific Gravity, Urine 1.022 1.005 - 1.030   pH 5.0 5.0 - 8.0   Glucose, UA NEGATIVE NEGATIVE mg/dL   Hgb urine dipstick LARGE (A) NEGATIVE   Bilirubin Urine MODERATE (A) NEGATIVE   Ketones, ur 15 (A) NEGATIVE mg/dL   Protein, ur 100 (A) NEGATIVE mg/dL   Nitrite POSITIVE (A) NEGATIVE   Leukocytes, UA SMALL (A) NEGATIVE  Urine microscopic-add on     Status: Abnormal   Collection Time: 11/17/15  8:15 PM  Result Value Ref Range   Squamous Epithelial / LPF 0-5 (A) NONE SEEN   WBC, UA 0-5 0 - 5 WBC/hpf   RBC / HPF 6-30 0 - 5 RBC/hpf   Bacteria, UA FEW (A) NONE SEEN   Crystals CA OXALATE CRYSTALS (A) NEGATIVE   Urine-Other MICROSCOPIC EXAM PERFORMED ON UNCONCENTRATED URINE     Comment: AMORPHOUS URATES/PHOSPHATES  Lactic acid, plasma     Status: Abnormal   Collection Time: 11/17/15  8:35 PM  Result Value Ref Range   Lactic Acid, Venous 2.7 (HH) 0.5 - 2.0 mmol/L    Comment: CRITICAL RESULT CALLED TO, READ BACK BY AND VERIFIED WITH: PORTER C,RN 11/17/15 2143 WAYK   Carboxyhemoglobin     Status: Abnormal   Collection Time: 11/17/15  8:35 PM  Result Value Ref Range   Total hemoglobin 12.9 (L) 13.5 - 18.0 g/dL   O2 Saturation 62.5 %   Carboxyhemoglobin 0.4 (L) 0.5 - 1.5 %   Methemoglobin 1.5 0.0 - 1.5 %  Glucose, capillary     Status: Abnormal   Collection Time: 11/17/15 11:35 PM  Result Value Ref Range    Glucose-Capillary 128 (H) 65 - 99 mg/dL  Glucose, capillary     Status: None   Collection Time: 11/18/15  3:30 AM  Result Value Ref Range   Glucose-Capillary 89 65 - 99 mg/dL  Blood gas, arterial     Status: Abnormal   Collection Time: 11/18/15  4:15 AM  Result Value Ref Range   FIO2 0.40    Delivery systems VENTILATOR    Mode PRESSURE REGULATED VOLUME CONTROL    VT 590 mL   LHR 14 resp/min   Peep/cpap 5.0 cm H20   pH, Arterial 7.410 7.350 -  7.450   pCO2 arterial 38.4 35.0 - 45.0 mmHg   pO2, Arterial 131 (H) 80.0 - 100.0 mmHg   Bicarbonate 23.9 20.0 - 24.0 mEq/L   TCO2 25.0 0 - 100 mmol/L   Acid-base deficit 0.2 0.0 - 2.0 mmol/L   O2 Saturation 97.3 %   Patient temperature 98.6    Collection site A-LINE    Drawn by [phone]    Sample type ARTERIAL DRAW   Comprehensive metabolic panel     Status: Abnormal   Collection Time: 11/18/15  4:40 AM  Result Value Ref Range   Sodium 145 135 - 145 mmol/L   Potassium 4.8 3.5 - 5.1 mmol/L   Chloride 108 101 - 111 mmol/L   CO2 23 22 - 32 mmol/L   Glucose, Bld 112 (H) 65 - 99 mg/dL   BUN 60 (H) 6 - 20 mg/dL   Creatinine, Ser 5.96 (H) 0.61 - 1.24 mg/dL   Calcium 5.8 (LL) 8.9 - 10.3 mg/dL    Comment: CRITICAL RESULT CALLED TO, READ BACK BY AND VERIFIED WITH: PORTER C,RN 11/18/15 0536 WAYK    Total Protein 4.1 (L) 6.5 - 8.1 g/dL   Albumin 1.8 (L) 3.5 - 5.0 g/dL   AST 28 15 - 41 U/L   ALT 12 (L) 17 - 63 U/L   Alkaline Phosphatase 60 38 - 126 U/L   Total Bilirubin 0.9 0.3 - 1.2 mg/dL   GFR calc non Af Amer 9 (L) >60 mL/min   GFR calc Af Amer 10 (L) >60 mL/min    Comment: (NOTE) The eGFR has been calculated using the CKD EPI equation. This calculation has not been validated in all clinical situations. eGFR's persistently <60 mL/min signify possible Chronic Kidney Disease.    Anion gap 14 5 - 15  Magnesium     Status: None   Collection Time: 11/18/15  4:40 AM  Result Value Ref Range   Magnesium 2.1 1.7 - 2.4 mg/dL  Phosphorus      Status: None   Collection Time: 11/18/15  4:40 AM  Result Value Ref Range   Phosphorus 4.2 2.5 - 4.6 mg/dL  APTT     Status: None   Collection Time: 11/18/15  4:40 AM  Result Value Ref Range   aPTT 31 24 - 37 seconds  Protime-INR     Status: Abnormal   Collection Time: 11/18/15  4:40 AM  Result Value Ref Range   Prothrombin Time 17.8 (H) 11.6 - 15.2 seconds   INR 1.46 0.00 - 1.49  CBC with Differential/Platelet     Status: Abnormal   Collection Time: 11/18/15  4:40 AM  Result Value Ref Range   WBC 8.4 4.0 - 10.5 K/uL   RBC 4.23 4.22 - 5.81 MIL/uL   Hemoglobin 12.9 (L) 13.0 - 17.0 g/dL   HCT 38.4 (L) 39.0 - 52.0 %   MCV 90.8 78.0 - 100.0 fL   MCH 30.5 26.0 - 34.0 pg   MCHC 33.6 30.0 - 36.0 g/dL   RDW 14.1 11.5 - 15.5 %   Platelets 287 150 - 400 K/uL   Neutrophils Relative % 83 %   Lymphocytes Relative 11 %   Monocytes Relative 5 %   Eosinophils Relative 1 %   Basophils Relative 0 %   Neutro Abs 7.0 1.7 - 7.7 K/uL   Lymphs Abs 0.9 0.7 - 4.0 K/uL   Monocytes Absolute 0.4 0.1 - 1.0 K/uL   Eosinophils Absolute 0.1 0.0 - 0.7 K/uL   Basophils  Absolute 0.0 0.0 - 0.1 K/uL   WBC Morphology MILD LEFT SHIFT (1-5% METAS, OCC MYELO, OCC BANDS)     Comment: TOXIC GRANULATION ATYPICAL LYMPHOCYTES   Lactic acid, plasma     Status: None   Collection Time: 11/18/15  4:40 AM  Result Value Ref Range   Lactic Acid, Venous 1.5 0.5 - 2.0 mmol/L  Glucose, capillary     Status: None   Collection Time: 11/18/15  8:17 AM  Result Value Ref Range   Glucose-Capillary 88 65 - 99 mg/dL  Glucose, capillary     Status: Abnormal   Collection Time: 11/18/15 11:57 AM  Result Value Ref Range   Glucose-Capillary 103 (H) 65 - 99 mg/dL  Troponin I     Status: Abnormal   Collection Time: 11/18/15 12:05 PM  Result Value Ref Range   Troponin I 1.18 (HH) <0.031 ng/mL    Comment:        POSSIBLE MYOCARDIAL ISCHEMIA. SERIAL TESTING RECOMMENDED. CRITICAL RESULT CALLED TO, READ BACK BY AND VERIFIED  WITH: Delrae Rend RN AT 1303 11/18/15 BY WOOLLENK   Glucose, capillary     Status: None   Collection Time: 11/18/15  4:11 PM  Result Value Ref Range   Glucose-Capillary 69 65 - 99 mg/dL  Troponin I     Status: Abnormal   Collection Time: 11/18/15  6:06 PM  Result Value Ref Range   Troponin I 1.23 (HH) <0.031 ng/mL    Comment:        POSSIBLE MYOCARDIAL ISCHEMIA. SERIAL TESTING RECOMMENDED. CRITICAL VALUE NOTED.  VALUE IS CONSISTENT WITH PREVIOUSLY REPORTED AND CALLED VALUE.   Glucose, capillary     Status: None   Collection Time: 11/18/15  7:54 PM  Result Value Ref Range   Glucose-Capillary 79 65 - 99 mg/dL  Heparin level (unfractionated)     Status: Abnormal   Collection Time: 11/18/15  9:30 PM  Result Value Ref Range   Heparin Unfractionated 0.10 (L) 0.30 - 0.70 IU/mL    Comment:        IF HEPARIN RESULTS ARE BELOW EXPECTED VALUES, AND PATIENT DOSAGE HAS BEEN CONFIRMED, SUGGEST FOLLOW UP TESTING OF ANTITHROMBIN III LEVELS.   Troponin I     Status: Abnormal   Collection Time: 11/18/15 11:45 PM  Result Value Ref Range   Troponin I 0.82 (HH) <0.031 ng/mL    Comment:        POSSIBLE MYOCARDIAL ISCHEMIA. SERIAL TESTING RECOMMENDED. CRITICAL VALUE NOTED.  VALUE IS CONSISTENT WITH PREVIOUSLY REPORTED AND CALLED VALUE.   Glucose, capillary     Status: Abnormal   Collection Time: 11/19/15 12:27 AM  Result Value Ref Range   Glucose-Capillary 101 (H) 65 - 99 mg/dL   Comment 1 Notify RN   CBC     Status: Abnormal   Collection Time: 11/19/15  4:10 AM  Result Value Ref Range   WBC 8.8 4.0 - 10.5 K/uL   RBC 3.96 (L) 4.22 - 5.81 MIL/uL   Hemoglobin 11.7 (L) 13.0 - 17.0 g/dL   HCT 36.0 (L) 39.0 - 52.0 %   MCV 90.9 78.0 - 100.0 fL   MCH 29.5 26.0 - 34.0 pg   MCHC 32.5 30.0 - 36.0 g/dL   RDW 14.0 11.5 - 15.5 %   Platelets 219 150 - 400 K/uL  Phosphorus     Status: Abnormal   Collection Time: 11/19/15  4:10 AM  Result Value Ref Range   Phosphorus 4.9 (H) 2.5 - 4.6  mg/dL  Magnesium     Status: None   Collection Time: 11/19/15  4:10 AM  Result Value Ref Range   Magnesium 2.2 1.7 - 2.4 mg/dL  I-STAT 3, arterial blood gas (G3+)     Status: Abnormal   Collection Time: 11/19/15  4:22 AM  Result Value Ref Range   pH, Arterial 7.387 7.350 - 7.450   pCO2 arterial 38.0 35.0 - 45.0 mmHg   pO2, Arterial 123.0 (H) 80.0 - 100.0 mmHg   Bicarbonate 22.8 20.0 - 24.0 mEq/L   TCO2 24 0 - 100 mmol/L   O2 Saturation 99.0 %   Acid-base deficit 2.0 0.0 - 2.0 mmol/L   Patient temperature 98.7 F    Collection site ARTERIAL LINE    Drawn by RT    Sample type ARTERIAL   Glucose, capillary     Status: None   Collection Time: 11/19/15  4:38 AM  Result Value Ref Range   Glucose-Capillary 96 65 - 99 mg/dL  Basic metabolic panel     Status: Abnormal   Collection Time: 11/19/15  8:13 AM  Result Value Ref Range   Sodium 144 135 - 145 mmol/L   Potassium 4.2 3.5 - 5.1 mmol/L   Chloride 108 101 - 111 mmol/L   CO2 20 (L) 22 - 32 mmol/L   Glucose, Bld 130 (H) 65 - 99 mg/dL   BUN 61 (H) 6 - 20 mg/dL   Creatinine, Ser 4.67 (H) 0.61 - 1.24 mg/dL   Calcium 6.0 (LL) 8.9 - 10.3 mg/dL    Comment: CRITICAL RESULT CALLED TO, READ BACK BY AND VERIFIED WITH: A.COLLINS,RN 11/19/15 _0  BY V.WILKINS DELTA CHECK NOTED    GFR calc non Af Amer 12 (L) >60 mL/min   GFR calc Af Amer 14 (L) >60 mL/min    Comment: (NOTE) The eGFR has been calculated using the CKD EPI equation. This calculation has not been validated in all clinical situations. eGFR's persistently <60 mL/min signify possible Chronic Kidney Disease.    Anion gap 16 (H) 5 - 15  Glucose, capillary     Status: Abnormal   Collection Time: 11/19/15  8:23 AM  Result Value Ref Range   Glucose-Capillary 104 (H) 65 - 99 mg/dL  I-STAT 3, arterial blood gas (G3+)     Status: Abnormal   Collection Time: 11/19/15 10:19 AM  Result Value Ref Range   pH, Arterial 7.311 (L) 7.350 - 7.450   pCO2 arterial 43.2 35.0 - 45.0 mmHg    pO2, Arterial 81.0 80.0 - 100.0 mmHg   Bicarbonate 21.7 20.0 - 24.0 mEq/L   TCO2 23 0 - 100 mmol/L   O2 Saturation 94.0 %   Acid-base deficit 4.0 (H) 0.0 - 2.0 mmol/L   Patient temperature 99.3 F    Collection site RADIAL, ALLEN'S TEST ACCEPTABLE    Drawn by VP    Sample type ARTERIAL   Lactic acid, plasma     Status: None   Collection Time: 11/19/15 11:04 AM  Result Value Ref Range   Lactic Acid, Venous 1.3 0.5 - 2.0 mmol/L    Imaging: Ct Abdomen Pelvis Wo Contrast  11/17/2015  CLINICAL DATA:  Acute abdominal pain, nausea and vomiting for 2 days. Elevated lactate. EXAM: CT ABDOMEN AND PELVIS WITHOUT CONTRAST TECHNIQUE: Multidetector CT imaging of the abdomen and pelvis was performed following the standard protocol without IV contrast. COMPARISON:  12/08/2013 FINDINGS: Lower chest:  Minor bibasilar atelectasis versus scarring. Hepatobiliary: Scattered areas of portal venous gas throughout the right and  left hepatic lobes. No biliary dilatation. Incidental gallstones layer in the gallbladder, image 39. Pancreas: Diffusely thin atrophic. No ductal dilatation or focal abnormality by noncontrast imaging. Spleen: Within normal limits in size. Adrenals/Urinary Tract: Normal adrenal glands. No hydronephrosis or obstructing ureteral calculus on either side. Ureters are decompressed and symmetric. Left kidney demonstrates a hypodense 2 cm renal cyst in the lower pole medially, image 47. Upper pole left renal cyst anteriorly measures 10 mm, image 34. Stomach/Bowel: Stomach is moderately distended. Beginning with the fourth portion of the duodenum and involving several dilated loops of jejunum there is extensive small bowel pneumatosis throughout the entire abdomen. There is some sparing of the distal ileum. There is diffuse edema throughout the central mesentery and scattered foci of central mesenteric venous gas noted throughout the mesenteric veins. Findings compatible with small bowel ischemia and  associated ileus. Colon is relatively decompressed but contains air. Minor scattered colonic diverticulosis most pronounced in the sigmoid. No fluid collection or abscess. No definite pneumoperitoneum. Vascular/Lymphatic: Extensive atherosclerosis of the aorta and abdominal vasculature. No aneurysm or retroperitoneal hemorrhage. No significant adenopathy. Reproductive: No mass or other significant abnormality. Other: Right hip fixation hardware creates artifact. No inguinal abnormality or hernia. Small fat containing umbilical hernia evident. Postop changes from inguinal hernia repairs without recurrence. Musculoskeletal: Bones are osteopenic. Degenerative changes of the spine. No acute osseous finding. IMPRESSION: Diffuse extensive small bowel pneumatosis with mild dilatation throughout the abdomen and pelvis involving the distal duodenum and the jejunum with some sparing of the ileum compatible with small bowel ischemia. Diffuse central mesenteric and portal venous gas with central mesenteric edema/congestion. Associated diffuse small bowel ileus Extensive abdominal and aortic atherosclerosis No significant pneumoperitoneum or abscess Cholelithiasis Colonic diverticulosis These results were called by telephone at the time of interpretation on 11/17/2015 at 3:15 pm to Dr. Ezequiel Essex , who verbally acknowledged these results. Electronically Signed   By: Jerilynn Mages.  Shick M.D.   On: 11/17/2015 15:19   Dg Chest Port 1 View  11/19/2015  CLINICAL DATA:  Respiratory failure EXAM: PORTABLE CHEST 1 VIEW COMPARISON:  Chest radiograph from one day prior. FINDINGS: Endotracheal tube tip is 6 cm above the carina. Right internal jugular central venous catheter terminates in the middle third of the superior vena cava. Enteric tube enters stomach with the tip not seen on this image. Stable cardiomediastinal silhouette with normal heart size. No pneumothorax. No pleural effusion. Improved lung volumes with resolved atelectasis. No  pulmonary edema. IMPRESSION: 1. Endotracheal tube tip is 6 cm above the carina, consider advancing 1-2 cm. 2. No active cardiopulmonary disease. Lung volumes are improved and basilar atelectasis has resolved. Electronically Signed   By: Ilona Sorrel M.D.   On: 11/19/2015 09:11   Portable Chest Xray  11/18/2015  CLINICAL DATA:  Respiratory failure EXAM: PORTABLE CHEST 1 VIEW COMPARISON:  Chest radiograph from one day prior. FINDINGS: Endotracheal tube tip is 2.0 cm above the carina. Enteric tube enters the stomach with the tip not seen on this image. Right internal jugular central venous catheter terminates at the cavoatrial junction. Stable cardiomediastinal silhouette with normal heart size. No pneumothorax. Trace right pleural effusion. No overt pulmonary edema. Hazy opacities at the right greater than left lung bases, slightly worsened bilaterally. IMPRESSION: 1. Well-positioned support hardware as described . 2. Slightly worsened hazy bibasilar lung opacities, right greater than left, favor atelectasis, cannot exclude a developing pneumonia. Electronically Signed   By: Ilona Sorrel M.D.   On: 11/18/2015 09:56   Portable  Chest Xray  11/17/2015  CLINICAL DATA:  Acute respiratory failure with hypoxemia EXAM: PORTABLE CHEST 1 VIEW COMPARISON:  Chest radiograph from earlier today. FINDINGS: Endotracheal tube tip is 4 cm above the carina. Enteric tube terminates in the proximal stomach. Right internal jugular central venous catheter terminates in the middle third of the superior vena cava. Stable cardiomediastinal silhouette with normal heart size. No pneumothorax. No pleural effusion. No pulmonary edema. Mild right basilar scarring versus atelectasis. No acute consolidative airspace disease. IMPRESSION: Well-positioned support structures as described. Mild scarring versus atelectasis at the right lung base. Electronically Signed   By: Ilona Sorrel M.D.   On: 11/17/2015 18:48   Dg Chest Portable 1  View  11/17/2015  CLINICAL DATA:  Shortness of breath, decreased blood pressure in O2 sats. Cough. Aspiration? EXAM: PORTABLE CHEST 1 VIEW COMPARISON:  Chest x-ray dated 12/08/2013. FINDINGS: Cardiomediastinal silhouette is stable in size and configuration. Probable mild scarring at the right lung base. Lungs otherwise clear. No pleural effusion or pneumothorax seen. Osseous and soft tissue structures about the chest are unremarkable. IMPRESSION: No active disease. Electronically Signed   By: Franki Cabot M.D.   On: 11/17/2015 14:49   Dg Abd Portable 1v  11/17/2015  CLINICAL DATA:  Severe lower abdominal pain.  GI bleed. EXAM: PORTABLE ABDOMEN - 1 VIEW COMPARISON:  12/08/2013 CT abdomen/pelvis. FINDINGS: There is prominent pneumatosis involving mildly dilated small bowel loops throughout the abdomen. Surgical sutures are noted in the left mid abdomen. No gross pneumoperitoneum on this supine image. Moderate stool in the rectum. Partially visualized left proximal femur hardware. IMPRESSION: Severe pneumatosis involving mildly dilated small bowel loops throughout the abdomen. Findings are worrisome for diffuse small bowel ischemia. Recommend correlation with CT abdomen/pelvis with IV contrast. These results were called by telephone at the time of interpretation on 11/17/2015 at 2:48 pm to Dr. Ezequiel Essex , who verbally acknowledged these results. Electronically Signed   By: Ilona Sorrel M.D.   On: 11/17/2015 14:50   EKG: AIVR at 84 (this is directly after returning from the OR)  Assessment:  Active Problems:   Pressure ulcer   Septic shock (HCC)   Complete heart block (HCC)   Malnutrition of moderate degree   Cardiomyopathy, ischemic   Plan:  1. Remains critically ill. Another episode of CHB this morning - possibly related to induction for anesthesia. Followed by AIVR. Surgery cancelled. This could be related to inferior infarct and AV nodal branch ischemia. Not a cath candidate at this time  due to renal failure. He will need to have surgical closure of the abdomen. We likely to consider a temporary pacer wire to support HR during surgery. Will need to coordinate with cath lab and surgery, probably tomorrow. Continue to wean levophed as tolerated to keep MAP >60. Heparin restarted. Troponin trending down. Start aspirin 81 mg per tube daily (ok with surgery).  Time Spent Directly with Patient:  30 minutes  Length of Stay:  LOS: 2 days    CRITICAL CARE:  The patient is critically ill with multi-organ system failure and requires high complexity decision making for assessment and support, frequent evaluation and titration of therapies, application of advanced monitoring technologies and extensive interpretation of multiple databases.  Pixie Casino, MD, [facility] Attending Cardiologist [place] Emre Stock 11/19/2015, 11:42 AM

## 2015-11-19 NOTE — Anesthesia Preprocedure Evaluation (Addendum)
Anesthesia Evaluation  Patient identified by MRN, date of birth, ID band Patient unresponsive    Reviewed: Allergy & Precautions, H&P , NPO status , Patient's Chart, lab work & pertinent test results  Airway Mallampati: Intubated       Dental no notable dental hx.    Pulmonary neg pulmonary ROS,  VDRF   Pulmonary exam normal breath sounds clear to auscultation       Cardiovascular + CAD and + Past MI  + dysrhythmias  Rhythm:Regular Rate:Bradycardia     Neuro/Psych Anxiety Depression Bipolar Disorder negative neurological ROS     GI/Hepatic negative GI ROS, Neg liver ROS,   Endo/Other  negative endocrine ROS  Renal/GU negative Renal ROS  negative genitourinary   Musculoskeletal  (+) Arthritis , Osteoarthritis,    Abdominal   Peds  Hematology negative hematology ROS (+) anemia ,   Anesthesia Other Findings   Reproductive/Obstetrics negative OB ROS                           Anesthesia Physical Anesthesia Plan  ASA: IV  Anesthesia Plan: General   Post-op Pain Management:    Induction: Intravenous  Airway Management Planned:   Additional Equipment:   Intra-op Plan:   Post-operative Plan: Post-operative intubation/ventilation  Informed Consent: I have reviewed the patients History and Physical, chart, labs and discussed the procedure including the risks, benefits and alternatives for the proposed anesthesia with the patient or authorized representative who has indicated his/her understanding and acceptance.   Dental advisory given  Plan Discussed with: CRNA and Surgeon  Anesthesia Plan Comments:         Anesthesia Quick Evaluation

## 2015-11-19 NOTE — Op Note (Signed)
11/19/2015  9:41 AM  PATIENT:  Shawn [name]  65 y.o. male  PRE-OPERATIVE DIAGNOSIS:  Open abdomen  POST-OPERATIVE DIAGNOSIS:  Open abdomen, necrosis of previous small bowel anastomosis  PROCEDURE:  Procedure(s): EXPLORATORY LAPAROTOMY OPEN ABDOMEN (N/A) Small bowel resection  SURGEON:  Surgeon(s) and Role:    * Ralene Ok, MD - Primary    ANESTHESIA:   local and general  EBL:  Total I/O In: 137.1 [I.V.:137.1] Out: 20 [Urine:20]  BLOOD ADMINISTERED:none  DRAINS: none   LOCAL MEDICATIONS USED:  BUPIVICAINE   SPECIMEN:  Source of Specimen:  Small bowel with necrosis  DISPOSITION OF SPECIMEN:  PATHOLOGY  COUNTS:  YES  TOURNIQUET:  * No tourniquets in log *  DICTATION: .Dragon Dictation Indication for procedure: Patient is a 65 year old male who came in secondary to abdominal pain and sepsis. Patient had a CT scan which revealed pneumatosis. Patient underwent ex-lap which revealed viable bowel and abrasion of the previous anastomosis site without full-thickness necrosis. Patient was left with open abdomen for a second look. Patient subsequently developed MI and third degree heart block. Patient was on heparin. Patient was taken back to the operating room for reexploration and washout with possible closure.  Details of procedure after the patient was consented he was taken back to the OR and placed in supine position bilateral SCDs in place. Patient was previously intubated and was placed on anesthesia. Patient had pacer leads and pads placed. He was been placed prior to the operation.  At this time the patient was prepped and draped in the usual sterile fashion. Timeout was called all facts verified.  The previous VAC sponge had been removed from intra-abdominal cavity. The omentum was retracted cephalad. The previous small bowel anastomosis which had been in question was visualized. This appeared to have full-thickness necrosis. Percent eviscerate the small bowel  contents. The proximal small bowel at the jejunum appeared to have spotty areas of punctate hemorrhages. There was no overt necrosis out of the did appear to be some minimal minor bruising along side the punctate hemorrhage.  The small bowel was run from the ligament of Treitz distally to the terminal ileum. The only apparent necrosis was at the previous anastomosis site. This was resected after a mesenteric window was made. A 75 GI stapler was used to transect the bowel proximal and distal to the area of ischemia. A LigaSure device was used to ligate the mesentery.  The right colon, transverse colon, left, sigmoid, and rectum appeared to be viable. There is no spillage of enteric contents into the abdomen. The abdomen was washed out with sterile saline 4 L. Secondary to the fact that the patient was continued on pressors, was being paced, and complete heart block the decision was made to keep the bowel in discontinuity and his abdomen open. The blue abdominal VAC sponge was placed into the abdomen this was sealed using the routine sticky drapes. Suction was tested and hooked up to the suction device.  The patient was taken back to the ICU in critical condition and intubated.    PLAN OF CARE: Admit to inpatient   PATIENT DISPOSITION:  ICU - intubated and critically ill.   Delay start of Pharmacological VTE agent (>24hrs) due to surgical blood loss or risk of bleeding: no

## 2015-11-19 NOTE — Transfer of Care (Signed)
Immediate Anesthesia Transfer of Care Note  Patient: Shawn [name]  Procedure(s) Performed: Procedure(s): EXPLORATORY LAPAROTOMY OPEN ABDOMEN ABDOMINAL WOUND VAC CHANGE (N/A) SMALL BOWEL RESECTION  Patient Location: ICU  Anesthesia Type:General  Level of Consciousness: sedated, unresponsive and Patient remains intubated per anesthesia plan  Airway & Oxygen Therapy: Patient remains intubated per anesthesia plan and Patient placed on Ventilator (see vital sign flow sheet for setting)  Post-op Assessment: Report given to RN, Post -op Vital signs reviewed and unstable, Anesthesiologist notified and cardiology at bedside  Post vital signs: Reviewed and unstable  Last Vitals:  Filed Vitals:   11/19/15 0821 11/19/15 1010  BP:  98/50  Pulse:  93  Temp: 37.4 C   Resp:  14    Complications: No apparent anesthesia complications

## 2015-11-19 NOTE — Progress Notes (Signed)
Pharmacy Antibiotic Note  Shawn [name] is a 65 y.o. male admitted on 11/17/2015 with abdominal pain x 3 days associated with N/V fever and chills. Now with open abdomen, repeated trips to the OR. To continue on vancomycin, Zosyn and anidulafungin.  Plan: -Vancomycin 1g IV q24h -anidulafungin 100mg  IV q24  -Zosyn 2.25g IV q8h  Height: 5\' 10"  (177.8 cm) Weight: 180 lb 5.4 oz (81.8 kg) IBW/kg (Calculated) : 73  Temp (24hrs), Avg:99.2 F (37.3 C), Min:98.3 F (36.8 C), Max:100.3 F (37.9 C)   Recent Labs Lab 11/17/15 1400 11/17/15 1418 11/17/15 1419 11/17/15 2000 11/17/15 2035 11/18/15 0440 11/19/15 0410 11/19/15 0813 11/19/15 1104 11/19/15 1745  WBC 11.1*  --   --  6.1  --  8.4 8.8  --   --   --   CREATININE 7.47* 6.70*  --  6.23*  --  5.96*  --  4.67*  --   --   LATICACIDVEN  --   --  12.02*  --  2.7* 1.5  --   --  1.3  --   VANCORANDOM  --   --   --   --   --   --   --   --   --  10    Estimated Creatinine Clearance: 15.8 mL/min (by C-G formula based on Cr of 4.67).    Allergies  Allergen Reactions  . Codeine Nausea Only    flushing  . Depakote [Divalproex Sodium] Other (See Comments)    Makes me crazy    Antimicrobials this admission: Vanc 3/17 >> Zosyn 3/17 >> Eraxis 3/17 >>  Dose adjustments this admission: 3/19 VR = 75mcg/mL  Microbiology results: MRSA PCR neg 3/17 BCx: ngtd  Thank you for allowing pharmacy to be a part of this patient's care.  Prentice Sackrider D. Maygan Koeller, PharmD, BCPS Clinical Pharmacist Pager: [phone] 11/19/2015 7:04 PM

## 2015-11-19 NOTE — Progress Notes (Signed)
2 Days Post-Op  Subjective: Have noted cardiac events of the last day.  This inferior MI may be part of the explanation for his intestinal pneumatosis secondary to "low-flow" hypoperfusion with some mucosal ischemia but no full-thickness necrosis. Heparin gtt on hold for last few hours.  Objective: Vital signs in last 24 hours: Temp:  [98.4 F (36.9 C)-101.5 F (38.6 C)] 98.4 F (36.9 C) (03/19 0440) Pulse Rate:  [25-106] 101 (03/19 0700) Resp:  [10-25] 14 (03/19 0700) BP: (74-110)/(38-69) 91/64 mmHg (03/19 0700) SpO2:  [91 %-100 %] 100 % (03/19 0700) Arterial Line BP: (77-127)/(39-73) 102/57 mmHg (03/19 0700) FiO2 (%):  [40 %] 40 % (03/19 0400) Weight:  [81.8 kg (180 lb 5.4 oz)] 81.8 kg (180 lb 5.4 oz) (03/19 0700)    Intake/Output from previous day: 03/18 0701 - 03/19 0700 In: 4930.3 [I.V.:4140.3; NG/GT:50; IV Piggyback:330] Out: [id number] [Urine:1775; Emesis/NG output:600; Drains:1000] Intake/Output this shift:    General appearance: Intubated, sedated VAC with good seal; serosanguinous output  Lab Results:   Recent Labs  11/18/15 0440 11/19/15 0410  WBC 8.4 8.8  HGB 12.9* 11.7*  HCT 38.4* 36.0*  PLT 287 219   BMET  Recent Labs  11/17/15 2000 11/18/15 0440  NA 147* 145  K 3.9 4.8  CL 104 108  CO2 24 23  GLUCOSE 137* 112*  BUN 57* 60*  CREATININE 6.23* 5.96*  CALCIUM 6.5* 5.8*   PT/INR  Recent Labs  11/17/15 2000 11/18/15 0440  LABPROT 17.7* 17.8*  INR 1.45 1.46   ABG  Recent Labs  11/18/15 0415 11/19/15 0422  PHART 7.410 7.387  HCO3 23.9 22.8    Studies/Results: Ct Abdomen Pelvis Wo Contrast  11/17/2015  CLINICAL DATA:  Acute abdominal pain, nausea and vomiting for 2 days. Elevated lactate. EXAM: CT ABDOMEN AND PELVIS WITHOUT CONTRAST TECHNIQUE: Multidetector CT imaging of the abdomen and pelvis was performed following the standard protocol without IV contrast. COMPARISON:  12/08/2013 FINDINGS: Lower chest:  Minor bibasilar atelectasis versus  scarring. Hepatobiliary: Scattered areas of portal venous gas throughout the right and left hepatic lobes. No biliary dilatation. Incidental gallstones layer in the gallbladder, image 39. Pancreas: Diffusely thin atrophic. No ductal dilatation or focal abnormality by noncontrast imaging. Spleen: Within normal limits in size. Adrenals/Urinary Tract: Normal adrenal glands. No hydronephrosis or obstructing ureteral calculus on either side. Ureters are decompressed and symmetric. Left kidney demonstrates a hypodense 2 cm renal cyst in the lower pole medially, image 47. Upper pole left renal cyst anteriorly measures 10 mm, image 34. Stomach/Bowel: Stomach is moderately distended. Beginning with the fourth portion of the duodenum and involving several dilated loops of jejunum there is extensive small bowel pneumatosis throughout the entire abdomen. There is some sparing of the distal ileum. There is diffuse edema throughout the central mesentery and scattered foci of central mesenteric venous gas noted throughout the mesenteric veins. Findings compatible with small bowel ischemia and associated ileus. Colon is relatively decompressed but contains air. Minor scattered colonic diverticulosis most pronounced in the sigmoid. No fluid collection or abscess. No definite pneumoperitoneum. Vascular/Lymphatic: Extensive atherosclerosis of the aorta and abdominal vasculature. No aneurysm or retroperitoneal hemorrhage. No significant adenopathy. Reproductive: No mass or other significant abnormality. Other: Right hip fixation hardware creates artifact. No inguinal abnormality or hernia. Small fat containing umbilical hernia evident. Postop changes from inguinal hernia repairs without recurrence. Musculoskeletal: Bones are osteopenic. Degenerative changes of the spine. No acute osseous finding. IMPRESSION: Diffuse extensive small bowel pneumatosis with mild dilatation throughout the  abdomen and pelvis involving the distal duodenum  and the jejunum with some sparing of the ileum compatible with small bowel ischemia. Diffuse central mesenteric and portal venous gas with central mesenteric edema/congestion. Associated diffuse small bowel ileus Extensive abdominal and aortic atherosclerosis No significant pneumoperitoneum or abscess Cholelithiasis Colonic diverticulosis These results were called by telephone at the time of interpretation on 11/17/2015 at 3:15 pm to Dr. Ezequiel Essex , who verbally acknowledged these results. Electronically Signed   By: Jerilynn Mages.  Shick M.D.   On: 11/17/2015 15:19   Portable Chest Xray  11/18/2015  CLINICAL DATA:  Respiratory failure EXAM: PORTABLE CHEST 1 VIEW COMPARISON:  Chest radiograph from one day prior. FINDINGS: Endotracheal tube tip is 2.0 cm above the carina. Enteric tube enters the stomach with the tip not seen on this image. Right internal jugular central venous catheter terminates at the cavoatrial junction. Stable cardiomediastinal silhouette with normal heart size. No pneumothorax. Trace right pleural effusion. No overt pulmonary edema. Hazy opacities at the right greater than left lung bases, slightly worsened bilaterally. IMPRESSION: 1. Well-positioned support hardware as described . 2. Slightly worsened hazy bibasilar lung opacities, right greater than left, favor atelectasis, cannot exclude a developing pneumonia. Electronically Signed   By: Ilona Sorrel M.D.   On: 11/18/2015 09:56   Portable Chest Xray  11/17/2015  CLINICAL DATA:  Acute respiratory failure with hypoxemia EXAM: PORTABLE CHEST 1 VIEW COMPARISON:  Chest radiograph from earlier today. FINDINGS: Endotracheal tube tip is 4 cm above the carina. Enteric tube terminates in the proximal stomach. Right internal jugular central venous catheter terminates in the middle third of the superior vena cava. Stable cardiomediastinal silhouette with normal heart size. No pneumothorax. No pleural effusion. No pulmonary edema. Mild right basilar  scarring versus atelectasis. No acute consolidative airspace disease. IMPRESSION: Well-positioned support structures as described. Mild scarring versus atelectasis at the right lung base. Electronically Signed   By: Ilona Sorrel M.D.   On: 11/17/2015 18:48   Dg Chest Portable 1 View  11/17/2015  CLINICAL DATA:  Shortness of breath, decreased blood pressure in O2 sats. Cough. Aspiration? EXAM: PORTABLE CHEST 1 VIEW COMPARISON:  Chest x-ray dated 12/08/2013. FINDINGS: Cardiomediastinal silhouette is stable in size and configuration. Probable mild scarring at the right lung base. Lungs otherwise clear. No pleural effusion or pneumothorax seen. Osseous and soft tissue structures about the chest are unremarkable. IMPRESSION: No active disease. Electronically Signed   By: Franki Cabot M.D.   On: 11/17/2015 14:49   Dg Abd Portable 1v  11/17/2015  CLINICAL DATA:  Severe lower abdominal pain.  GI bleed. EXAM: PORTABLE ABDOMEN - 1 VIEW COMPARISON:  12/08/2013 CT abdomen/pelvis. FINDINGS: There is prominent pneumatosis involving mildly dilated small bowel loops throughout the abdomen. Surgical sutures are noted in the left mid abdomen. No gross pneumoperitoneum on this supine image. Moderate stool in the rectum. Partially visualized left proximal femur hardware. IMPRESSION: Severe pneumatosis involving mildly dilated small bowel loops throughout the abdomen. Findings are worrisome for diffuse small bowel ischemia. Recommend correlation with CT abdomen/pelvis with IV contrast. These results were called by telephone at the time of interpretation on 11/17/2015 at 2:48 pm to Dr. Ezequiel Essex , who verbally acknowledged these results. Electronically Signed   By: Ilona Sorrel M.D.   On: 11/17/2015 14:50    Anti-infectives: Anti-infectives    Start     Dose/Rate Route Frequency Ordered Stop   11/18/15 1800  anidulafungin (ERAXIS) 100 mg in sodium chloride 0.9 % 100 mL  IVPB     100 mg over 90 Minutes Intravenous Every  24 hours 11/17/15 1812     11/17/15 2200  piperacillin-tazobactam (ZOSYN) IVPB 2.25 g     2.25 g 100 mL/hr over 30 Minutes Intravenous 3 times per day 11/17/15 1930     11/17/15 1830  anidulafungin (ERAXIS) 200 mg in sodium chloride 0.9 % 200 mL IVPB     200 mg over 180 Minutes Intravenous  Once 11/17/15 1812 11/17/15 2203   11/17/15 1815  vancomycin (VANCOCIN) 1,500 mg in sodium chloride 0.9 % 500 mL IVPB     1,500 mg 250 mL/hr over 120 Minutes Intravenous  Once 11/17/15 1813 11/17/15 2103   11/17/15 1530  piperacillin-tazobactam (ZOSYN) IVPB 3.375 g     3.375 g 12.5 mL/hr over 240 Minutes Intravenous NOW 11/17/15 1521 11/17/15 1942   11/17/15 1515  piperacillin-tazobactam (ZOSYN) IVPB 4.5 g  Status:  Discontinued     4.5 g 200 mL/hr over 30 Minutes Intravenous  Once 11/17/15 1505 11/18/15 0040      Assessment/Plan: s/p Procedure(s): EXPLORATORY LAPAROTOMY (N/A) Plan return to OR this morning for second look at small bowel and probable fascial closure.   Remains critically will with renal failure, VDRF, inferior MI   LOS: 2 days    Elishah Ashmore K. 11/19/2015

## 2015-11-19 NOTE — Progress Notes (Signed)
PULMONARY / CRITICAL CARE MEDICINE   Name: PAYAM ZERO MRN: [id number] DOB: 10/06/1950    ADMISSION DATE:  11/17/2015 CONSULTATION DATE:  003/17/2017  REFERRING MD:  Dr. Redmond Pulling with [facility]  CHIEF COMPLAINT:  Postoperative ventilator management, sepsis management  HISTORY OF PRESENT ILLNESS:   This is a 65 year old Male presented to the emergency department on 11/17/2015 complaining of 3 days of abdominal pain with nausea vomiting and constipation. This is associated apparently with fevers and chills. The patient was intubated and sedated on our assessment and cannot provide a history so history was obtained by chart review. CT scan was worrisome for dead gut and so he was taken to the operating room. In the operating room he was found to have viable gut that some pneumatosis. He was left open and taken to the MICU for further care.   SUBJECTIVE:  As above  VITAL SIGNS: BP 94/54 mmHg  Pulse 99  Temp(Src) 99.3 F (37.4 C) (Oral)  Resp 15  Ht 5\' 10"  (1.778 m)  Wt 180 lb 5.4 oz (81.8 kg)  BMI 25.88 kg/m2  SpO2 98%  HEMODYNAMICS: CVP:  [3 mmHg-11 mmHg] 11 mmHg  VENTILATOR SETTINGS: Vent Mode:  [-] PRVC FiO2 (%):  [40 %] 40 % Set Rate:  [14 bmp] 14 bmp Vt Set:  [590 mL-600 mL] 590 mL PEEP:  [5 cmH20] 5 cmH20 Plateau Pressure:  [13 cmH20-14 cmH20] 14 cmH20  INTAKE / OUTPUT: I/O last 3 completed shifts: In: 11140.4 [I.V.:6490.4; Other:410; NG/GT:50; IV Piggyback:4190] Out: 5005 [Urine:2105; Emesis/NG output:1100; Drains:1800]  PHYSICAL EXAMINATION: General:  Thin man in NAD on vent Neuro:  Sedated, just returned from OR on diprivan  HEENT:  ETT, OGT in place. Normal secretions. Cardiovascular:  CHB, on epi drip to maintain hr>60 Lungs:  Normal mechanical breath sounds. Abdomen:  Wound vac-in place, CCS unable to close wound due to heart block Musculoskeletal:  No swollen joints. Skin:  No rashes on anterior body.  LABS:  BMET  Recent Labs Lab  11/17/15 2000 11/18/15 0440 11/19/15 0813  NA 147* 145 144  K 3.9 4.8 4.2  CL 104 108 108  CO2 24 23 20*  BUN 57* 60* 61*  CREATININE 6.23* 5.96* 4.67*  GLUCOSE 137* 112* 130*    Electrolytes  Recent Labs Lab 11/17/15 2000 11/18/15 0440 11/19/15 0410 11/19/15 0813  CALCIUM 6.5* 5.8*  --  6.0*  MG 2.3 2.1 2.2  --   PHOS 4.5 4.2 4.9*  --     CBC  Recent Labs Lab 11/17/15 2000 11/18/15 0440 11/19/15 0410  WBC 6.1 8.4 8.8  HGB 13.1 12.9* 11.7*  HCT 39.2 38.4* 36.0*  PLT 218 287 219    Coag's  Recent Labs Lab 11/17/15 1400 11/17/15 2000 11/18/15 0440  APTT 25  --  31  INR 1.29 1.45 1.46    Sepsis Markers  Recent Labs Lab 11/17/15 1419 11/17/15 2035 11/18/15 0440  LATICACIDVEN 12.02* 2.7* 1.5    ABG  Recent Labs Lab 11/17/15 1908 11/18/15 0415 11/19/15 0422  PHART 7.447 7.410 7.387  PCO2ART 48.6* 38.4 38.0  PO2ART 102.0* 131* 123.0*    Liver Enzymes  Recent Labs Lab 11/17/15 1400 11/17/15 2000 11/18/15 0440  AST 39 27 28  ALT 13* 10* 12*  ALKPHOS 106 69 60  BILITOT 0.6 0.8 0.9  ALBUMIN 2.8* 2.0* 1.8*    Cardiac Enzymes  Recent Labs Lab 11/18/15 1205 11/18/15 1806 11/18/15 2345  TROPONINI 1.18* 1.23* 0.82*  Glucose  Recent Labs Lab 11/18/15 1157 11/18/15 1611 11/18/15 1954 11/19/15 0027 11/19/15 0438 11/19/15 0823  GLUCAP 103* 69 79 101* 96 104*    Imaging Dg Chest Port 1 View  11/19/2015  CLINICAL DATA:  Respiratory failure EXAM: PORTABLE CHEST 1 VIEW COMPARISON:  Chest radiograph from one day prior. FINDINGS: Endotracheal tube tip is 6 cm above the carina. Right internal jugular central venous catheter terminates in the middle third of the superior vena cava. Enteric tube enters stomach with the tip not seen on this image. Stable cardiomediastinal silhouette with normal heart size. No pneumothorax. No pleural effusion. Improved lung volumes with resolved atelectasis. No pulmonary edema. IMPRESSION: 1.  Endotracheal tube tip is 6 cm above the carina, consider advancing 1-2 cm. 2. No active cardiopulmonary disease. Lung volumes are improved and basilar atelectasis has resolved. Electronically Signed   By: Ilona Sorrel M.D.   On: 11/19/2015 09:11     STUDIES:  11/17/2015 CT abdomen and pelvis: Diffuse extensive small bowel pneumatosis with mild dilation throughout the abdomen and pelvisconsistent with small bowel ischemia. Diffuse central Mesentericand portal venous small gas, Associated diffuse small bowel ileus, extensive abdominal and aortic atherosclerosis, no significant pneumoperitoneum or abscess, cholelithiasis, colonic diverticulosis  CULTURES: 3/17 Blood > 3/18 sputum>>  ANTIBIOTICS: March 17 Zosyn>  3/18 vanc>> 3/18 eraxis>>  SIGNIFICANT EVENTS: 11/17/2015 admission, exploratory laparotomy 3/18 complete heart block 3/18 +MI 3/19 to OT for wound closure  LINES/TUBES: 11/17/2015 central venous line > 11/17/2015 arterial line > 11/17/2015 endotracheal tube >  DISCUSSION: This is a 65 year old male with a past medical history significant forTobacco abuse, bipolar disorder and alcoholism who [facility] department on 11/17/2015 with severe sepsis in the setting of diffuse abdominal pneumatosis but no evidence of dead bowel on exploratory laparotomy. Problems include acute respiratory failure with hypoxemia and acute kidney injury. The exact cause of the pneumatosis is uncertain but likely is the underlying cause of his sepsis. 3/18  ASSESSMENT / PLAN:  PULMONARY A: Acute respiratory failure with hypoxemia P:   Full ventilatory support At this time there is no plan for extubation as his +MI  Vent bundle  CARDIOVASCULAR A:  Septic shock, status post 40 mL per Kg, still w/ CVP of 5 Heart block with bradycardia + trop cw MI 3/19 in OR chb required atropine and epi to maintain hr/bp. Surgery canceled  May need pacer  P:   Levophed, Titrate to mean  arterial pressure greater than 65 Telemetry monitoring Epi Drip turned off post return from OR.   Check 12 lead CVP monitoring Arterial line for blood pressure management Follow lactic acid Cards consult, may need temp pacer, called and spoke with Dr. Debara Pickett 3/18 1000 .Transcutaneous pacer made available + trop but not candidate for cath lab. Anticoagulation when OK with surgery Have called cards to revisit due to Heart block in OR may need pacer. Will turn epi off and use transcutaneous pacer if needed. No extubation at this time    RENAL Lab Results  Component Value Date   CREATININE 4.67* 11/19/2015   CREATININE 5.96* 11/18/2015   CREATININE 6.23* 11/17/2015   CREATININE 0.76 09/04/2011    A:   Acute kidney injury Hypocalcemia  P:   Suspect ATN Foley catheter  IV fluid resuscitation   GASTROINTESTINAL A:   Small bowel pneumatosis, yet viable etiology uncertain Question upper GI bleed P:   Continue PPI infusion Postoperative management per surgery, wound to be left open for now  Returned to OR 3/19 for closure, but unable to close due to cardiac issues > 3rd degree block NPO  HEMATOLOGIC A:   No acute issues P:  Monitor for bleeding  INFECTIOUS A:   Septic shock secondary to peritonitis P:   Se id  ENDOCRINE CBG (last 3)   Recent Labs  11/19/15 0027 11/19/15 0438 11/19/15 0823  GLUCAP 101* 96 104*     A:   Hyperglycemia   CBG (last 3)    P:   Sliding-scale every 4  Hours with Accu-Cheks  NEUROLOGIC A:   Post op pain Sedation needs for ventilator synchrony P:   RASS goal: -1 Fentanyl drip   Global: OR canceled 3/19 for CHB. He is + MI. May need pacer.   FAMILY  - Updates: *Unable to contact son, message left to return call to unit.Bertram Savin family meet or Palliative Care meeting due by:  3/24  Richardson Landry Minor ACNP [facility] Shape PCCM Pager [phone] till 3 pm If no answer page [phone] 11/19/2015, 10:05 AM   Attending Note:   I have examined patient, reviewed labs, studies and notes. I have discussed the case with S Minor, and I agree with the data and plans as amended above. Septic shock - either cause of or due to bowel injury. Course c/b acute MI, intermittent 3rd degree AV block. Unable to tolerate OR and abd closure today due to hemodynamic instability. will plan to wean pressors as able, continue broad abx. No plans to lighten sedation while abdomen is open. May require pacer - appreciate Dr Hilty's help through the weekend. Independent critical care time is 40 minutes.   Baltazar Apo, MD, PhD 11/19/2015, 8:55 PM [facility] [phone] or if no answer [phone]

## 2015-11-19 NOTE — Progress Notes (Signed)
Jet for heparin Indication: chest pain/ACS  Allergies  Allergen Reactions  . Codeine Nausea Only    flushing  . Depakote [Divalproex Sodium] Other (See Comments)    Makes me crazy    Patient Measurements: Height: 5\' 10"  (177.8 cm) Weight: 180 lb 5.4 oz (81.8 kg) IBW/kg (Calculated) : 73  Vital Signs: Temp: 98.9 F (37.2 C) (03/19 1620) Temp Source: Oral (03/19 1620) BP: 94/67 mmHg (03/19 1830) Pulse Rate: 81 (03/19 1830)  Labs:  Recent Labs  11/17/15 1400  11/17/15 2000 11/18/15 0440  11/18/15 2130 11/18/15 2345 11/19/15 0410 11/19/15 0813 11/19/15 1059 11/19/15 1635 11/19/15 1745  HGB 15.8  < > 13.1 12.9*  --   --   --  11.7*  --   --   --   --   HCT 47.7  < > 39.2 38.4*  --   --   --  36.0*  --   --   --   --   PLT 338  --  218 287  --   --   --  219  --   --   --   --   APTT 25  --   --  31  --   --   --   --   --   --   --   --   LABPROT 16.2*  --  17.7* 17.8*  --   --   --   --   --   --   --   --   INR 1.29  --  1.45 1.46  --   --   --   --   --   --   --   --   HEPARINUNFRC  --   --   --   --   --  0.10*  --   --   --   --   --  0.18*  CREATININE 7.47*  < > 6.23* 5.96*  --   --   --   --  4.67*  --   --   --   TROPONINI 0.10*  --   --   --   < >  --  0.82*  --   --  0.63* 0.62*  --   < > = values in this interval not displayed.  Estimated Creatinine Clearance: 15.8 mL/min (by C-G formula based on Cr of 4.67).  Assessment: 65 yo male presenting with 3 day shx of abdominal pain N/V, constopation, fevers, chills. Now with troponin bump, possible NSTEMI, heparin resumed after OR.  Heparin level low at 0.18 units/hr. No bleeding noted. Hgb 11.7, plts 219. No excessive bleeding noted.  Goal of Therapy:  Heparin level 0.3-0.7 units/ml Monitor platelets by anticoagulation protocol: Yes   Plan:  Increase heparin to 1300 units/hr Daily HL, CBC RN aware to stop 4 hrs prior to [place] D. Sivan Cuello, PharmD,  BCPS Clinical Pharmacist Pager: [phone] 11/19/2015 6:53 PM

## 2015-11-19 NOTE — Progress Notes (Signed)
Pharmacy notified of patients Heparin lab result of 18.   Siri Cole RN [phone]

## 2015-11-19 NOTE — Anesthesia Postprocedure Evaluation (Signed)
Anesthesia Post Note  Patient: Shawn [name]  Procedure(s) Performed: Procedure(s) (LRB): EXPLORATORY LAPAROTOMY OPEN ABDOMEN ABDOMINAL WOUND VAC CHANGE (N/A) SMALL BOWEL RESECTION  Patient location during evaluation: ICU Anesthesia Type: General Level of consciousness: sedated and patient remains intubated per anesthesia plan Vital Signs Assessment: vitals unstable Respiratory status: patient remains intubated per anesthesia plan and patient on ventilator - see flowsheet for VS Cardiovascular status: blood pressure returned to baseline and unstable Anesthetic complications: no    Last Vitals:  Filed Vitals:   11/19/15 0821 11/19/15 1010  BP:  98/50  Pulse:  93  Temp: 37.4 C   Resp:  14    Last Pain:  Filed Vitals:   11/19/15 1011  PainSc: 7                  Shawn [name]

## 2015-11-20 ENCOUNTER — Encounter (HOSPITAL_COMMUNITY)
Admission: EM | Disposition: A | Discharge status: Skilled Nursing Facility | Payer: Self-pay | Source: Home / Self Care | Attending: Internal Medicine

## 2015-11-20 ENCOUNTER — Encounter (HOSPITAL_COMMUNITY): Payer: Self-pay | Admitting: Surgery

## 2015-11-20 DIAGNOSIS — R001 Bradycardia, unspecified: Secondary | ICD-10-CM

## 2015-11-20 DIAGNOSIS — R0902 Hypoxemia: Secondary | ICD-10-CM

## 2015-11-20 DIAGNOSIS — J9601 Acute respiratory failure with hypoxia: Secondary | ICD-10-CM | POA: Insufficient documentation

## 2015-11-20 DIAGNOSIS — I442 Atrioventricular block, complete: Secondary | ICD-10-CM

## 2015-11-20 HISTORY — PX: CARDIAC CATHETERIZATION: SHX172

## 2015-11-20 LAB — CBC
HEMATOCRIT: 33.1 % — AB (ref 39.0–52.0)
Hemoglobin: 10.7 g/dL — ABNORMAL LOW (ref 13.0–17.0)
MCH: 29.5 pg (ref 26.0–34.0)
MCHC: 32.3 g/dL (ref 30.0–36.0)
MCV: 91.2 fL (ref 78.0–100.0)
PLATELETS: 184 10*3/uL (ref 150–400)
RBC: 3.63 MIL/uL — ABNORMAL LOW (ref 4.22–5.81)
RDW: 14.1 % (ref 11.5–15.5)
WBC: 10 10*3/uL (ref 4.0–10.5)

## 2015-11-20 LAB — BASIC METABOLIC PANEL
ANION GAP: 9 (ref 5–15)
BUN: 32 mg/dL — ABNORMAL HIGH (ref 6–20)
CHLORIDE: 112 mmol/L — AB (ref 101–111)
CO2: 23 mmol/L (ref 22–32)
Calcium: 6.4 mg/dL — CL (ref 8.9–10.3)
Creatinine, Ser: 2.32 mg/dL — ABNORMAL HIGH (ref 0.61–1.24)
GFR calc Af Amer: 32 mL/min — ABNORMAL LOW (ref >60)
GFR, EST NON AFRICAN AMERICAN: 27 mL/min — AB (ref >60)
GLUCOSE: 100 mg/dL — AB (ref 65–99)
POTASSIUM: 4 mmol/L (ref 3.5–5.1)
Sodium: 144 mmol/L (ref 135–145)

## 2015-11-20 LAB — URINE CULTURE: CULTURE: NO GROWTH

## 2015-11-20 LAB — TROPONIN I: Troponin I: 0.56 ng/mL (ref <0.031)

## 2015-11-20 LAB — GLUCOSE, CAPILLARY
GLUCOSE-CAPILLARY: 95 mg/dL (ref 65–99)
Glucose-Capillary: 103 mg/dL — ABNORMAL HIGH (ref 65–99)
Glucose-Capillary: 110 mg/dL — ABNORMAL HIGH (ref 65–99)
Glucose-Capillary: 115 mg/dL — ABNORMAL HIGH (ref 65–99)
Glucose-Capillary: 97 mg/dL (ref 65–99)

## 2015-11-20 LAB — HEPARIN LEVEL (UNFRACTIONATED): HEPARIN UNFRACTIONATED: 0.16 [IU]/mL — AB (ref 0.30–0.70)

## 2015-11-20 LAB — LACTIC ACID, PLASMA: LACTIC ACID, VENOUS: 1 mmol/L (ref 0.5–2.0)

## 2015-11-20 LAB — TRIGLYCERIDES: TRIGLYCERIDES: 225 mg/dL — AB (ref <150)

## 2015-11-20 SURGERY — TEMPORARY PACEMAKER

## 2015-11-20 MED ORDER — ACETAMINOPHEN 325 MG PO TABS: 650.0000 mg | ORAL_TABLET | ORAL | Status: DC | PRN

## 2015-11-20 MED ORDER — SODIUM CHLORIDE 0.9 % IV SOLN: 250.0000 mL | INTRAVENOUS | Status: DC | PRN

## 2015-11-20 MED ORDER — FENTANYL CITRATE (PF) 100 MCG/2ML IJ SOLN
25.0000 ug | Freq: Once | INTRAMUSCULAR | Status: AC
  Administered 2015-11-20: 25 ug via INTRAVENOUS

## 2015-11-20 MED ORDER — FENTANYL CITRATE (PF) 100 MCG/2ML IJ SOLN
INTRAMUSCULAR | Status: AC
  Filled 2015-11-20: qty 2

## 2015-11-20 MED ORDER — SODIUM CHLORIDE 0.9% FLUSH: 3.0000 mL | INTRAVENOUS | Status: DC | PRN

## 2015-11-20 MED ORDER — ALBUTEROL SULFATE (2.5 MG/3ML) 0.083% IN NEBU: 2.5000 mg | INHALATION_SOLUTION | RESPIRATORY_TRACT | Status: DC | PRN

## 2015-11-20 MED ORDER — ONDANSETRON HCL 4 MG/2ML IJ SOLN
4.0000 mg | Freq: Four times a day (QID) | INTRAMUSCULAR | Status: DC | PRN
  Administered 2015-11-25 – 2015-11-29 (×3): 4 mg via INTRAVENOUS
  Filled 2015-11-20 (×3): qty 2

## 2015-11-20 MED ORDER — MIDAZOLAM HCL 2 MG/2ML IJ SOLN
INTRAMUSCULAR | Status: AC
  Filled 2015-11-20: qty 2

## 2015-11-20 MED ORDER — SODIUM CHLORIDE 0.9% FLUSH
3.0000 mL | Freq: Two times a day (BID) | INTRAVENOUS | Status: DC
  Administered 2015-11-20 – 2015-11-27 (×11): 3 mL via INTRAVENOUS

## 2015-11-20 MED ORDER — HEPARIN (PORCINE) IN NACL 100-0.45 UNIT/ML-% IJ SOLN
1800.0000 [IU]/h | INTRAMUSCULAR | Status: DC
  Administered 2015-11-20: 1500 [IU]/h via INTRAVENOUS
  Filled 2015-11-20 (×3): qty 250

## 2015-11-20 MED ORDER — MIDAZOLAM HCL 2 MG/2ML IJ SOLN
2.0000 mg | Freq: Once | INTRAMUSCULAR | Status: AC
  Administered 2015-11-20: 2 mg via INTRAVENOUS

## 2015-11-20 MED ORDER — HEPARIN (PORCINE) IN NACL 2-0.9 UNIT/ML-% IJ SOLN
INTRAMUSCULAR | Status: DC | PRN
  Administered 2015-11-20: 500 mL

## 2015-11-20 MED ORDER — SODIUM CHLORIDE 0.9 % IV SOLN
1.0000 g | Freq: Once | INTRAVENOUS | Status: AC
  Administered 2015-11-20: 1 g via INTRAVENOUS
  Filled 2015-11-20: qty 10

## 2015-11-20 MED ORDER — LIDOCAINE HCL (PF) 1 % IJ SOLN
INTRAMUSCULAR | Status: DC | PRN
  Administered 2015-11-20: 20 mL

## 2015-11-20 MED ORDER — LIDOCAINE HCL (PF) 1 % IJ SOLN
INTRAMUSCULAR | Status: AC
  Filled 2015-11-20: qty 30

## 2015-11-20 MED ORDER — HEPARIN (PORCINE) IN NACL 2-0.9 UNIT/ML-% IJ SOLN
INTRAMUSCULAR | Status: AC
  Filled 2015-11-20: qty 500

## 2015-11-20 MED ORDER — MIDAZOLAM HCL 2 MG/2ML IJ SOLN
1.0000 mg | Freq: Once | INTRAMUSCULAR | Status: AC
  Administered 2015-11-20: 1 mg via INTRAVENOUS

## 2015-11-20 SURGICAL SUPPLY — 4 items
CATH S G BIP PACING (SET/KITS/TRAYS/PACK) ×3 IMPLANT
HOVERMATT SINGLE USE (MISCELLANEOUS) ×3 IMPLANT
SHEATH PINNACLE 6F 10CM (SHEATH) ×3 IMPLANT
SLEEVE REPOSITIONING LENGTH 30 (MISCELLANEOUS) ×3 IMPLANT

## 2015-11-20 NOTE — Progress Notes (Signed)
Pt arrived from EP lab. Intubated but awake. Lt fem venous sheath is secured and nacl @ 16ml/hr is infusing. Temp pacer is secured to lt fem site. Rate is 70 with an Ma 5. Pt is comfortable and awaiting a bed in 2 heart. Lt pt pulse 2+. Lt groin site is CDI.

## 2015-11-20 NOTE — Progress Notes (Signed)
Asked by general cardiology team to evaluate the patient for temporary pacemaker wire insertion secondary to CHB noted with induction of anesthesia at his last surgical procedure.  Currently is in Bee 1st degree AVBlock, 60's. Patient is currently intubated, febrile, and sedated on Levophed. Discussed with Dr. Lovena Le, will plan for temporary pacing wire insertion today. Tommye Standard, PA-C  EP Attending  Patient seen and examined. He is intubated but awake and has a bandage over his abdominal wound. RRR, and scattered rales on exam. His periphery is actually warm. Agree with the findings as noted above. He has developed intermittent CHB, and has not been able to have his bowel surgery. We will place a temporary PM via the left femoral vein. He is actually awake but we will attempt to obtain permission from his family as well prior to the procedure.  Mikle Bosworth.D.

## 2015-11-20 NOTE — Progress Notes (Signed)
Transported pt from 2M06 to Cath Lab #9 then to 2H03 on ventilator. Pt stable throughout with no complications.

## 2015-11-20 NOTE — Clinical Documentation Improvement (Signed)
Cardiology General Surgery Critical Care  Can the diagnosis of pressure ulcer be further specified?   Document if pressure ulcer with stage is Present on Admission   Document Site with laterality - Elbow, Back (upper/lower), Sacral, Hip, Buttock, Ankle, Heel, Head, Other (Specify)  Pressure Ulcer Stage - Stage1, Stage 2, Stage 3, Stage 4, Unstageable, Unspecified, Unable to Clinically Determine  Other  Clinically Undetermined  Please update your documentation within the medical record to reflect your response to this query. Thank you.  Supporting Information:(As per notes) "Pressure Ulcer"  Please exercise your independent, professional judgment when responding. A specific answer is not anticipated or expected.  Thank You, Alessandra Grout, RN, BSN, CCDS,Clinical Documentation Specialist:  [phone]  [phone]=[facility] [place] [facility]- Health Information Management

## 2015-11-20 NOTE — Interval H&P Note (Signed)
History and Physical Interval Note:  11/20/2015 12:29 PM  Shawn [name]  has presented today for surgery, with the diagnosis of syncope  The various methods of treatment have been discussed with the patient and family. After consideration of risks, benefits and other options for treatment, the patient has consented to  Procedure(s): Temporary Wire (N/A) as a surgical intervention .  The patient's history has been reviewed, patient examined, no change in status, stable for surgery.  I have reviewed the patient's chart and labs.  Questions were answered to the patient's satisfaction.     Cristopher Peru

## 2015-11-20 NOTE — Progress Notes (Signed)
1 Day Post-Op  Subjective: Intubated but awake   Objective: Vital signs in last 24 hours: Temp:  [98.3 F (36.8 C)-100.7 F (38.2 C)] 100.7 F (38.2 C) (03/20 0413) Pulse Rate:  [73-104] 104 (03/20 0729) Resp:  [0-24] 24 (03/20 0729) BP: (91-133)/(50-85) 99/64 mmHg (03/20 0729) SpO2:  [93 %-100 %] 100 % (03/20 0729) Arterial Line BP: (100-141)/(50-76) 108/52 mmHg (03/20 0400) FiO2 (%):  [40 %] 40 % (03/20 0729) Weight:  [82.3 kg (181 lb 7 oz)] 82.3 kg (181 lb 7 oz) (03/20 0600)    Intake/Output from previous day: 03/19 0701 - 03/20 0700 In: 3105.7 [I.V.:2715.7; NG/GT:30; IV Piggyback:300] Out: 2175 [Urine:1675; Emesis/NG output:350; Drains:125; Blood:25] Intake/Output this shift:    Abdomen non distended with VAC in place  Lab Results:   Recent Labs  11/19/15 0410 11/20/15 0520  WBC 8.8 10.0  HGB 11.7* 10.7*  HCT 36.0* 33.1*  PLT 219 184   BMET  Recent Labs  11/18/15 0440 11/19/15 0813  NA 145 144  K 4.8 4.2  CL 108 108  CO2 23 20*  GLUCOSE 112* 130*  BUN 60* 61*  CREATININE 5.96* 4.67*  CALCIUM 5.8* 6.0*   PT/INR  Recent Labs  11/17/15 2000 11/18/15 0440  LABPROT 17.7* 17.8*  INR 1.45 1.46   ABG  Recent Labs  11/19/15 0422 11/19/15 1019  PHART 7.387 7.311*  HCO3 22.8 21.7    Studies/Results: Dg Chest Port 1 View  11/19/2015  CLINICAL DATA:  Respiratory failure EXAM: PORTABLE CHEST 1 VIEW COMPARISON:  Chest radiograph from one day prior. FINDINGS: Endotracheal tube tip is 6 cm above the carina. Right internal jugular central venous catheter terminates in the middle third of the superior vena cava. Enteric tube enters stomach with the tip not seen on this image. Stable cardiomediastinal silhouette with normal heart size. No pneumothorax. No pleural effusion. Improved lung volumes with resolved atelectasis. No pulmonary edema. IMPRESSION: 1. Endotracheal tube tip is 6 cm above the carina, consider advancing 1-2 cm. 2. No active cardiopulmonary  disease. Lung volumes are improved and basilar atelectasis has resolved. Electronically Signed   By: Ilona Sorrel M.D.   On: 11/19/2015 09:11    Anti-infectives: Anti-infectives    Start     Dose/Rate Route Frequency Ordered Stop   11/19/15 2000  vancomycin (VANCOCIN) IVPB 1000 mg/200 mL premix     1,000 mg 200 mL/hr over 60 Minutes Intravenous Every 24 hours 11/19/15 1857     11/18/15 1800  anidulafungin (ERAXIS) 100 mg in sodium chloride 0.9 % 100 mL IVPB     100 mg over 90 Minutes Intravenous Every 24 hours 11/17/15 1812     11/17/15 2200  piperacillin-tazobactam (ZOSYN) IVPB 2.25 g     2.25 g 100 mL/hr over 30 Minutes Intravenous 3 times per day 11/17/15 1930     11/17/15 1830  anidulafungin (ERAXIS) 200 mg in sodium chloride 0.9 % 200 mL IVPB     200 mg over 180 Minutes Intravenous  Once 11/17/15 1812 11/17/15 2203   11/17/15 1815  vancomycin (VANCOCIN) 1,500 mg in sodium chloride 0.9 % 500 mL IVPB     1,500 mg 250 mL/hr over 120 Minutes Intravenous  Once 11/17/15 1813 11/17/15 2103   11/17/15 1530  piperacillin-tazobactam (ZOSYN) IVPB 3.375 g     3.375 g 12.5 mL/hr over 240 Minutes Intravenous NOW 11/17/15 1521 11/17/15 1942   11/17/15 1515  piperacillin-tazobactam (ZOSYN) IVPB 4.5 g  Status:  Discontinued     4.5  g 200 mL/hr over 30 Minutes Intravenous  Once 11/17/15 1505 11/18/15 0040      Assessment/Plan: s/p Procedure(s): EXPLORATORY LAPAROTOMY OPEN ABDOMEN ABDOMINAL WOUND VAC CHANGE (N/A) SMALL BOWEL RESECTION  Open abdomen -- taken to OR yesterday and had bowel resection but with left in discontinuity given pressure issues intraop with CHB.  Cardiology closely involved.  Will need to return to [place] or Wed for anastomosis and abdominal closer after cardiology's potential procedure.  LOS: 3 days    Jonia Oakey A 11/20/2015

## 2015-11-20 NOTE — Progress Notes (Signed)
ANTICOAGULATION CONSULT NOTE - Follow Up Consult  Pharmacy Consult for Heparin  Indication: chest pain/ACS  Patient Measurements: Height: 5\' 10"  (177.8 cm) Weight: 181 lb 7 oz (82.3 kg) IBW/kg (Calculated) : 73  Vital Signs: Temp: 101.6 F (38.7 C) (03/20 0754) Temp Source: Oral (03/20 0754) BP: 112/60 mmHg (03/20 1515) Pulse Rate: 75 (03/20 1515)  Labs:  Recent Labs  11/17/15 2000 11/18/15 0440  11/18/15 2130  11/19/15 0410 11/19/15 0813 11/19/15 1059 11/19/15 1635 11/19/15 1745 11/19/15 2330 11/20/15 0520  HGB 13.1 12.9*  --   --   --  11.7*  --   --   --   --   --  10.7*  HCT 39.2 38.4*  --   --   --  36.0*  --   --   --   --   --  33.1*  PLT 218 287  --   --   --  219  --   --   --   --   --  184  APTT  --  31  --   --   --   --   --   --   --   --   --   --   LABPROT 17.7* 17.8*  --   --   --   --   --   --   --   --   --   --   INR 1.45 1.46  --   --   --   --   --   --   --   --   --   --   HEPARINUNFRC  --   --   --  0.10*  --   --   --   --   --  0.18*  --  0.16*  CREATININE 6.23* 5.96*  --   --   --   --  4.67*  --   --   --   --   --   TROPONINI  --   --   < >  --   < >  --   --  0.63* 0.62*  --  0.56*  --   < > = values in this interval not displayed.  Estimated Creatinine Clearance: 15.8 mL/min (by C-G formula based on Cr of 4.67).   Assessment: On heparin this am for r/o acs. Temp pacer placed this afternoon and heparin paused for procedure. D/w EP and ok to resume heparin once back on unit (no bolus). No procedural complications noted.  Hgb slowly trending down this am to 10.7, plt ok, heparin adjusted to 1500 units/hr this morning and turned off prior to checking level. Will restart at this rate and check level tonight.   Plan for possible surgery tue/wed.  Goal of Therapy:  Heparin level 0.3-0.7 units/ml Monitor platelets by anticoagulation protocol: Yes   Plan:  -Restart heparin at 1500 units/hr once back on unit (2S?) -Check 8 hour  HL  Erin Hearing PharmD., BCPS Clinical Pharmacist Pager [phone] 11/20/2015 3:29 PM

## 2015-11-20 NOTE — Progress Notes (Signed)
ANTICOAGULATION CONSULT NOTE - Follow Up Consult  Pharmacy Consult for Heparin  Indication: chest pain/ACS  Patient Measurements: Height: 5\' 10"  (177.8 cm) Weight: 180 lb 5.4 oz (81.8 kg) IBW/kg (Calculated) : 73  Vital Signs: Temp: 100.7 F (38.2 C) (03/20 0413) Temp Source: Oral (03/20 0413) BP: 97/63 mmHg (03/20 0400) Pulse Rate: 80 (03/20 0400)  Labs:  Recent Labs  11/17/15 1400  11/17/15 2000 11/18/15 0440  11/18/15 2130  11/19/15 0410 11/19/15 0813 11/19/15 1059 11/19/15 1635 11/19/15 1745 11/19/15 2330 11/20/15 0520  HGB 15.8  < > 13.1 12.9*  --   --   --  11.7*  --   --   --   --   --  10.7*  HCT 47.7  < > 39.2 38.4*  --   --   --  36.0*  --   --   --   --   --  33.1*  PLT 338  --  218 287  --   --   --  219  --   --   --   --   --  184  APTT 25  --   --  31  --   --   --   --   --   --   --   --   --   --   LABPROT 16.2*  --  17.7* 17.8*  --   --   --   --   --   --   --   --   --   --   INR 1.29  --  1.45 1.46  --   --   --   --   --   --   --   --   --   --   HEPARINUNFRC  --   --   --   --   --  0.10*  --   --   --   --   --  0.18*  --  0.16*  CREATININE 7.47*  < > 6.23* 5.96*  --   --   --   --  4.67*  --   --   --   --   --   TROPONINI 0.10*  --   --   --   < >  --   < >  --   --  0.63* 0.62*  --  0.56*  --   < > = values in this interval not displayed.  Estimated Creatinine Clearance: 15.8 mL/min (by C-G formula based on Cr of 4.67).   Assessment: Heparin level sub-therapeutic despite rate increase, no issues per RN.   Goal of Therapy:  Heparin level 0.3-0.7 units/ml Monitor platelets by anticoagulation protocol: Yes   Plan:  -Increase heparin to 1500 units/hr -1400 HL  Narda Bonds 11/20/2015,6:22 AM

## 2015-11-20 NOTE — Progress Notes (Signed)
Patient ID: Shawn [name], male   DOB: Jan 14, 1951, 65 y.o.   MRN: [id number]   Plan to go back to OR tomorrow for bowel anastomosis and abdominal closure I discussed the procedure with the patient's son who agrees with the surgery

## 2015-11-20 NOTE — Progress Notes (Signed)
Critical calcium level received from the lab of 6.4. Dr. Oletta Darter notified and orders received.

## 2015-11-20 NOTE — Progress Notes (Addendum)
Subjective:  Pt awake  Does not appear too uncmfortable  Intubated   Objective: Filed Vitals:   11/20/15 0754 11/20/15 0800 11/20/15 0900 11/20/15 1000  BP:  103/65 107/64 92/56  Pulse:  93 90 88  Temp: 101.6 F (38.7 C)     TempSrc: Oral     Resp:  15 15 14   Height:      Weight:      SpO2:  98% 98% 97%   Weight change: 1 lb 1.6 oz (0.5 kg)  Intake/Output Summary (Last 24 hours) at 11/20/15 1029 Last data filed at 11/20/15 1000  Gross per 24 hour  Intake 3624.39 ml  Output   2605 ml  Net 1019.39 ml    General: Alert, awake, oriented x3, in no acute distress Neck:  JVP is normal Heart: Regular rate and rhythm, without murmurs, rubs, gallops.  Lungs: Clear to auscultation.  No rales or wheezes. Abdomen:  Wound sealed with drainage  Exemities:  No edema.   Neuro: Grossly intact, nonfocal.  Tele:  SR   Lab Results: Results for orders placed or performed during the hospital encounter of 11/17/15 (from the past 24 hour(s))  Troponin I (q 6hr x 3)     Status: Abnormal   Collection Time: 11/19/15 10:59 AM  Result Value Ref Range   Troponin I 0.63 (HH) <0.031 ng/mL  Lactic acid, plasma     Status: None   Collection Time: 11/19/15 11:04 AM  Result Value Ref Range   Lactic Acid, Venous 1.3 0.5 - 2.0 mmol/L  Glucose, capillary     Status: Abnormal   Collection Time: 11/19/15 12:25 PM  Result Value Ref Range   Glucose-Capillary 119 (H) 65 - 99 mg/dL  Glucose, capillary     Status: Abnormal   Collection Time: 11/19/15  4:18 PM  Result Value Ref Range   Glucose-Capillary 124 (H) 65 - 99 mg/dL  Troponin I (q 6hr x 3)     Status: Abnormal   Collection Time: 11/19/15  4:35 PM  Result Value Ref Range   Troponin I 0.62 (HH) <0.031 ng/mL  Heparin level (unfractionated)     Status: Abnormal   Collection Time: 11/19/15  5:45 PM  Result Value Ref Range   Heparin Unfractionated 0.18 (L) 0.30 - 0.70 IU/mL  Vancomycin, random     Status: None   Collection Time: 11/19/15  5:45  PM  Result Value Ref Range   Vancomycin Rm 10 ug/mL  Glucose, capillary     Status: Abnormal   Collection Time: 11/19/15  9:46 PM  Result Value Ref Range   Glucose-Capillary 110 (H) 65 - 99 mg/dL   Comment 1 Notify RN   Troponin I (q 6hr x 3)     Status: Abnormal   Collection Time: 11/19/15 11:30 PM  Result Value Ref Range   Troponin I 0.56 (HH) <0.031 ng/mL  Glucose, capillary     Status: Abnormal   Collection Time: 11/20/15 12:14 AM  Result Value Ref Range   Glucose-Capillary 103 (H) 65 - 99 mg/dL  Glucose, capillary     Status: Abnormal   Collection Time: 11/20/15  4:12 AM  Result Value Ref Range   Glucose-Capillary 110 (H) 65 - 99 mg/dL   Comment 1 Notify RN   CBC     Status: Abnormal   Collection Time: 11/20/15  5:20 AM  Result Value Ref Range   WBC 10.0 4.0 - 10.5 K/uL   RBC 3.63 (L) 4.22 - 5.81 MIL/uL  Hemoglobin 10.7 (L) 13.0 - 17.0 g/dL   HCT 33.1 (L) 39.0 - 52.0 %   MCV 91.2 78.0 - 100.0 fL   MCH 29.5 26.0 - 34.0 pg   MCHC 32.3 30.0 - 36.0 g/dL   RDW 14.1 11.5 - 15.5 %   Platelets 184 150 - 400 K/uL  Heparin level (unfractionated)     Status: Abnormal   Collection Time: 11/20/15  5:20 AM  Result Value Ref Range   Heparin Unfractionated 0.16 (L) 0.30 - 0.70 IU/mL  Lactic acid, plasma     Status: None   Collection Time: 11/20/15  6:30 AM  Result Value Ref Range   Lactic Acid, Venous 1.0 0.5 - 2.0 mmol/L  Glucose, capillary     Status: Abnormal   Collection Time: 11/20/15  7:57 AM  Result Value Ref Range   Glucose-Capillary 115 (H) 65 - 99 mg/dL    Studies/Results: No results found.  Medications: REviewed    @PROBHOSP @  Pt remains critically ill.  ON pressors.  Febrile  Awaiting surgery  Rhythm.  Currently in SR  Tele strip from 3/18 shows Wenkebach   Reviewed with EP  Plan for temp wire given reported events in OR with CHB (cannot find strips from this; received TCP)  Chronic systolic CHF Volume status is not bad  LVEF on 3/18 echo was 40 to 45%     Dorris Carnes 11/20/2015, 10:29 AM

## 2015-11-20 NOTE — Progress Notes (Signed)
PULMONARY / CRITICAL CARE MEDICINE   Name: Shawn [name] MRN: [id number] DOB: 10/27/1950    ADMISSION DATE:  11/17/2015 CONSULTATION DATE:  003/17/2017  REFERRING MD:  Dr. Redmond Pulling with [facility]  CHIEF COMPLAINT:  Postoperative ventilator management, sepsis management   SUBJECTIVE:  RT reports pt returned from cath lab after temporary pacemaker insertion to holding, pending transfer to ICU.  Pt remains febrile to 101, requiring 12 mcg levophed   VITAL SIGNS: BP 112/60 mmHg  Pulse 75  Temp(Src) 101.6 F (38.7 C) (Oral)  Resp 15  Ht 5\' 10"  (1.778 m)  Wt 181 lb 7 oz (82.3 kg)  BMI 26.03 kg/m2  SpO2 100%  HEMODYNAMICS: CVP:  [0 mmHg-7 mmHg] 5 mmHg  VENTILATOR SETTINGS: Vent Mode:  [-] PRVC FiO2 (%):  [40 %] 40 % Set Rate:  [14 bmp] 14 bmp Vt Set:  [590 mL] 590 mL PEEP:  [5 cmH20] 5 cmH20 Plateau Pressure:  [14 cmH20-18 cmH20] 18 cmH20  INTAKE / OUTPUT: I/O last 3 completed shifts: In: 5829.8 [I.V.:4889.8; Other:440; NG/GT:50; IV Piggyback:450] Out: [id number] [Urine:2975; Emesis/NG output:950; Drains:775; Blood:25]  PHYSICAL EXAMINATION: General:  Thin adult male in NAD on vent Neuro:  Sedate on vent, pupils 89mm  HEENT:  ETT, OGT in place. Cardiovascular:  s1s2 distant, regular Lungs: even/non-labored, diminished on right, coarse on left Abdomen:  Wound vac-in place c/d/i Musculoskeletal:  No acute deformities  Skin:  No rashes or lesions on anterior body. L femoral sheath c/d/i, no hematoma     LABS:  BMET  Recent Labs Lab 11/17/15 2000 11/18/15 0440 11/19/15 0813  NA 147* 145 144  K 3.9 4.8 4.2  CL 104 108 108  CO2 24 23 20*  BUN 57* 60* 61*  CREATININE 6.23* 5.96* 4.67*  GLUCOSE 137* 112* 130*   Electrolytes  Recent Labs Lab 11/17/15 2000 11/18/15 0440 11/19/15 0410 11/19/15 0813  CALCIUM 6.5* 5.8*  --  6.0*  MG 2.3 2.1 2.2  --   PHOS 4.5 4.2 4.9*  --    CBC  Recent Labs Lab 11/18/15 0440 11/19/15 0410 11/20/15 0520   WBC 8.4 8.8 10.0  HGB 12.9* 11.7* 10.7*  HCT 38.4* 36.0* 33.1*  PLT 287 219 184   Coag's  Recent Labs Lab 11/17/15 1400 11/17/15 2000 11/18/15 0440  APTT 25  --  31  INR 1.29 1.45 1.46   Sepsis Markers  Recent Labs Lab 11/18/15 0440 11/19/15 1104 11/20/15 0630  LATICACIDVEN 1.5 1.3 1.0   ABG  Recent Labs Lab 11/18/15 0415 11/19/15 0422 11/19/15 1019  PHART 7.410 7.387 7.311*  PCO2ART 38.4 38.0 43.2  PO2ART 131* 123.0* 81.0   Liver Enzymes  Recent Labs Lab 11/17/15 1400 11/17/15 2000 11/18/15 0440  AST 39 27 28  ALT 13* 10* 12*  ALKPHOS 106 69 60  BILITOT 0.6 0.8 0.9  ALBUMIN 2.8* 2.0* 1.8*   Cardiac Enzymes  Recent Labs Lab 11/19/15 1059 11/19/15 1635 11/19/15 2330  TROPONINI 0.63* 0.62* 0.56*   Glucose  Recent Labs Lab 11/19/15 1225 11/19/15 1618 11/19/15 2146 11/20/15 0014 11/20/15 0412 11/20/15 0757  GLUCAP 119* 124* 110* 103* 110* 115*    Imaging No results found.   STUDIES:  3/17  CT abdomen and pelvis >> diffuse extensive small bowel pneumatosis with mild dilation throughout the abdomen and pelvisconsistent with small bowel ischemia. Diffuse central Mesentericand portal venous small gas, Associated diffuse small bowel ileus, extensive abdominal and aortic atherosclerosis, no significant pneumoperitoneum or abscess, cholelithiasis, colonic diverticulosis  CULTURES: 3/17 BCx2 >> 3/18 sputum >> abundant klebsiella >> R - ampicillin, otherwise sensitive  3/18  UC >> neg   ANTIBIOTICS: Zosyn 3/17 >>  Vanc 3/18 >> Eraxis 3/18 >>  SIGNIFICANT EVENTS: 3/17 Admission, exploratory laparotomy 3/18 Complete heart block 3/18 MI 3/19 OR for wound closure 3/20 Cath lab for temp pacemaker   LINES/TUBES: R IJ TLC 3/17 >>  A Line 3/17 >>  ETT 3/17 >>   DISCUSSION: 65 year old male with a PMH of ETOH, tobacco abuse and bipolar disorder who presented to the emergency department on 3/17 with a 3 day hx of abdominal pain, nausea,  vomiting and constipation. Found to have CT concerning for diffuse abdominal pneumatosis and elevated lactic acid.  Patient was taken to OR for ex-lap, found to have viable gut, diffuse pneumatosis (no exact cause) & no evidence of dead bowel. He was left open and taken to the MICU.  He developed CHB on 3/18 and MI.  Later 3/19, was taken to OR for wound closure but developed CHB in OR and VAC was applied.  Subsequently taken to cath lab 3/20 for temporary pacemaker insertion.     ASSESSMENT / PLAN:  PULMONARY A: Acute hypoxic respiratory failure  Tobacco Abuse  P:   PRVC 8cc/kg Wean PEEP / FiO2 for sats > 92% Intermittent CXR PRN albuterol  VAP prevention Not candidate for SBT  CARDIOVASCULAR A:  Septic shock- in setting of pneumoatosis, klebsiella + sputum  Complete Heart Block - s/p temporary pacemaker insertion 3/20 MI  P:  Levophed for MAP > 65  Continue ASA ICU monitoring  Cardiology following, appreciate assistance  CVP monitoring Arterial line for blood pressure monitoring  Likely will need LHC at some point Heparin gtt per pharmacy   RENAL A:   Acute kidney injury - in setting of sepsis, MI, CHB, hypotension  Hypocalcemia  P:   Trend BMP / UOP  Foley catheter D51/2NS @ 100 ml/hr  GASTROINTESTINAL A:   Small bowel pneumatosis - viable bowel, uncertain etiology, unable to close 3/19 due to CHB, VAC applied Question upper GI bleed P:   PPI QD  Postoperative management per surgery  VAC mgmt per CCS / WOC  NPO  HEMATOLOGIC A:   Anemia - suspect of critical illness + component blood loss  P:  Trend CBC  Monitor for bleeding  Heparin gtt as above   INFECTIOUS A:   Septic shock secondary to peritonitis P:   ABX / Cultures as above  Trend WBC / Fever curve  CCS following   ENDOCRINE A:   Hyperglycemia   P:   SSI with CBG Q4   NEUROLOGIC A:   Post Operative Pain Sedation needs for ventilator synchrony P:   RASS goal: -1 Fentanyl drip for  pain  PRN versed for sedation   FAMILY  - Updates: No family at bedside 3/20.    - Inter-disciplinary family meet or Palliative Care meeting due by:  3/24   Noe Gens, NP-C [facility] Pgr: [phone] or if no answer [phone] 11/20/2015, 3:37 PM

## 2015-11-20 NOTE — H&P (View-Only) (Signed)
Asked by general cardiology team to evaluate the patient for temporary pacemaker wire insertion secondary to CHB noted with induction of anesthesia at his last surgical procedure.  Currently is in [place] 1st degree AVBlock, 60's. Patient is currently intubated, febrile, and sedated on Levophed. Discussed with Dr. Lovena Le, will plan for temporary pacing wire insertion today. Tommye Standard, PA-C  EP Attending  Patient seen and examined. He is intubated but awake and has a bandage over his abdominal wound. RRR, and scattered rales on exam. His periphery is actually warm. Agree with the findings as noted above. He has developed intermittent CHB, and has not been able to have his bowel surgery. We will place a temporary PM via the left femoral vein. He is actually awake but we will attempt to obtain permission from his family as well prior to the procedure.  Mikle Bosworth.D.

## 2015-11-20 NOTE — Progress Notes (Signed)
[place] Progress Note Patient Name: Shawn [name] DOB: 1951-02-07 MRN: [id number]   Date of Service  11/20/2015  HPI/Events of Note  Calcium = 6.4 and Albumin = 1.8. Ca++ corrected for Albumin level = 8.16.  eICU Interventions  Will replete Ca++.     Intervention Category Intermediate Interventions: Electrolyte abnormality - evaluation and management  Sommer,Steven Eugene 11/20/2015, 7:54 PM

## 2015-11-20 NOTE — Progress Notes (Signed)
Pts sister and brother-in-law in to see pt. Pt was asleep. Room number given and telephone number to 2heart.

## 2015-11-20 NOTE — Clinical Documentation Improvement (Signed)
General Surgery Critical Care   Possible Conditions?   Acute Blood Loss Anemia, including the suspected or known cause or associated condition(s)  Acute on chronic blood loss anemia, including the suspected or known cause or associated condition(s)  Chronic blood loss anemia, including the suspected or known cause or associated condition(s)  Precipitous drop in Hematocrit, including the suspected or known cause or associated condition(s)  Other  Clinically Undetermined  Document any associated diagnoses/conditions. Please update your documentation within the medical record to reflect your response to this query. Thank you.  Supporting Information: Abdominal Surgery - Small Bowel Resection Labs: Component     Latest Ref Rng 11/17/2015 11/17/2015 11/17/2015 11/17/2015 11/18/2015  Hemoglobin     13.0 - 17.0 g/dL 15.8 16.0 12.9 (L) 13.1 12.9 (L)  HCT     39.0 - 52.0 % 47.7 47.0 38.0 (L) 39.2 38.4 (L)   Component     Latest Ref Rng 11/19/2015 11/20/2015  Hemoglobin     13.0 - 17.0 g/dL 11.7 (L) 10.7 (L)  HCT     39.0 - 52.0 % 36.0 (L) 33.1 (L)   Please exercise your independent, professional judgment when responding. A specific answer is not anticipated or expected.  Thank You, Alessandra Grout, RN, BSN, CCDS,Clinical Documentation Specialist:  [phone]  [phone]=[employer]- [employer] Information Management

## 2015-11-21 ENCOUNTER — Inpatient Hospital Stay (HOSPITAL_COMMUNITY): Payer: Medicare Other

## 2015-11-21 ENCOUNTER — Inpatient Hospital Stay (HOSPITAL_COMMUNITY): Payer: Medicare Other | Admitting: Certified Registered Nurse Anesthetist

## 2015-11-21 ENCOUNTER — Encounter (HOSPITAL_COMMUNITY)
Admission: EM | Disposition: A | Discharge status: Skilled Nursing Facility | Payer: Self-pay | Source: Home / Self Care | Attending: Internal Medicine

## 2015-11-21 DIAGNOSIS — I5043 Acute on chronic combined systolic (congestive) and diastolic (congestive) heart failure: Secondary | ICD-10-CM | POA: Insufficient documentation

## 2015-11-21 HISTORY — PX: SMALL BOWEL REPAIR: SHX6447

## 2015-11-21 LAB — COMPREHENSIVE METABOLIC PANEL
ALK PHOS: 137 U/L — AB (ref 38–126)
ALT: 17 U/L (ref 17–63)
AST: 27 U/L (ref 15–41)
Albumin: 1.3 g/dL — ABNORMAL LOW (ref 3.5–5.0)
Anion gap: 9 (ref 5–15)
BUN: 24 mg/dL — AB (ref 6–20)
CHLORIDE: 111 mmol/L (ref 101–111)
CO2: 23 mmol/L (ref 22–32)
CREATININE: 1.88 mg/dL — AB (ref 0.61–1.24)
Calcium: 6.7 mg/dL — ABNORMAL LOW (ref 8.9–10.3)
GFR calc Af Amer: 41 mL/min — ABNORMAL LOW (ref >60)
GFR, EST NON AFRICAN AMERICAN: 35 mL/min — AB (ref >60)
Glucose, Bld: 123 mg/dL — ABNORMAL HIGH (ref 65–99)
Potassium: 3.4 mmol/L — ABNORMAL LOW (ref 3.5–5.1)
SODIUM: 143 mmol/L (ref 135–145)
Total Bilirubin: 1.5 mg/dL — ABNORMAL HIGH (ref 0.3–1.2)
Total Protein: 3.8 g/dL — ABNORMAL LOW (ref 6.5–8.1)

## 2015-11-21 LAB — GLUCOSE, CAPILLARY
GLUCOSE-CAPILLARY: 102 mg/dL — AB (ref 65–99)
GLUCOSE-CAPILLARY: 93 mg/dL (ref 65–99)
GLUCOSE-CAPILLARY: 95 mg/dL (ref 65–99)
Glucose-Capillary: 106 mg/dL — ABNORMAL HIGH (ref 65–99)
Glucose-Capillary: 109 mg/dL — ABNORMAL HIGH (ref 65–99)
Glucose-Capillary: 90 mg/dL (ref 65–99)

## 2015-11-21 LAB — CBC
HCT: 32.8 % — ABNORMAL LOW (ref 39.0–52.0)
HEMOGLOBIN: 10.6 g/dL — AB (ref 13.0–17.0)
MCH: 29.6 pg (ref 26.0–34.0)
MCHC: 32.3 g/dL (ref 30.0–36.0)
MCV: 91.6 fL (ref 78.0–100.0)
PLATELETS: 164 10*3/uL (ref 150–400)
RBC: 3.58 MIL/uL — AB (ref 4.22–5.81)
RDW: 14.1 % (ref 11.5–15.5)
WBC: 10.7 10*3/uL — AB (ref 4.0–10.5)

## 2015-11-21 LAB — CULTURE, RESPIRATORY

## 2015-11-21 LAB — MAGNESIUM: MAGNESIUM: 1.7 mg/dL (ref 1.7–2.4)

## 2015-11-21 LAB — CULTURE, RESPIRATORY W GRAM STAIN

## 2015-11-21 LAB — HEPARIN LEVEL (UNFRACTIONATED): Heparin Unfractionated: 0.11 IU/mL — ABNORMAL LOW (ref 0.30–0.70)

## 2015-11-21 SURGERY — REPAIR, SMALL INTESTINE
Anesthesia: General | Site: Abdomen

## 2015-11-21 MED ORDER — ROCURONIUM BROMIDE 100 MG/10ML IV SOLN
INTRAVENOUS | Status: DC | PRN
  Administered 2015-11-21: 50 mg via INTRAVENOUS

## 2015-11-21 MED ORDER — MIDAZOLAM HCL 2 MG/2ML IJ SOLN
INTRAMUSCULAR | Status: AC
  Filled 2015-11-21: qty 2

## 2015-11-21 MED ORDER — SUCCINYLCHOLINE CHLORIDE 20 MG/ML IJ SOLN
INTRAMUSCULAR | Status: AC
  Filled 2015-11-21: qty 1

## 2015-11-21 MED ORDER — MAGNESIUM SULFATE 2 GM/50ML IV SOLN
2.0000 g | Freq: Once | INTRAVENOUS | Status: AC
  Administered 2015-11-21: 2 g via INTRAVENOUS
  Filled 2015-11-21: qty 50

## 2015-11-21 MED ORDER — GERHARDT'S BUTT CREAM
TOPICAL_CREAM | CUTANEOUS | Status: DC | PRN
  Administered 2015-11-28: 1 via TOPICAL
  Filled 2015-11-21: qty 1

## 2015-11-21 MED ORDER — ONDANSETRON HCL 4 MG/2ML IJ SOLN
INTRAMUSCULAR | Status: AC
  Filled 2015-11-21: qty 2

## 2015-11-21 MED ORDER — HEPARIN (PORCINE) IN NACL 100-0.45 UNIT/ML-% IJ SOLN
1800.0000 [IU]/h | INTRAMUSCULAR | Status: DC
  Filled 2015-11-21 (×2): qty 250

## 2015-11-21 MED ORDER — MIDAZOLAM HCL 5 MG/5ML IJ SOLN
INTRAMUSCULAR | Status: DC | PRN
  Administered 2015-11-21: 2 mg via INTRAVENOUS

## 2015-11-21 MED ORDER — POTASSIUM CHLORIDE 10 MEQ/50ML IV SOLN
10.0000 meq | INTRAVENOUS | Status: AC
  Administered 2015-11-21 (×6): 10 meq via INTRAVENOUS
  Filled 2015-11-21 (×6): qty 50

## 2015-11-21 MED ORDER — PIPERACILLIN-TAZOBACTAM 3.375 G IVPB
3.3750 g | Freq: Three times a day (TID) | INTRAVENOUS | Status: DC
  Administered 2015-11-21 – 2015-11-26 (×15): 3.375 g via INTRAVENOUS
  Filled 2015-11-21 (×19): qty 50

## 2015-11-21 MED ORDER — PHENYLEPHRINE 40 MCG/ML (10ML) SYRINGE FOR IV PUSH (FOR BLOOD PRESSURE SUPPORT)
PREFILLED_SYRINGE | INTRAVENOUS | Status: AC
  Filled 2015-11-21: qty 10

## 2015-11-21 MED ORDER — LACTATED RINGERS IV SOLN
INTRAVENOUS | Status: DC | PRN
  Administered 2015-11-21: 12:00:00 via INTRAVENOUS

## 2015-11-21 MED ORDER — ROCURONIUM BROMIDE 50 MG/5ML IV SOLN
INTRAVENOUS | Status: AC
  Filled 2015-11-21: qty 1

## 2015-11-21 MED ORDER — LIDOCAINE HCL (CARDIAC) 20 MG/ML IV SOLN
INTRAVENOUS | Status: AC
  Filled 2015-11-21: qty 5

## 2015-11-21 MED ORDER — 0.9 % SODIUM CHLORIDE (POUR BTL) OPTIME
TOPICAL | Status: DC | PRN
  Administered 2015-11-21 (×2): 1000 mL

## 2015-11-21 MED ORDER — FENTANYL CITRATE (PF) 250 MCG/5ML IJ SOLN
INTRAMUSCULAR | Status: AC
  Filled 2015-11-21: qty 5

## 2015-11-21 MED ORDER — PROPOFOL 10 MG/ML IV BOLUS
INTRAVENOUS | Status: AC
  Filled 2015-11-21: qty 20

## 2015-11-21 SURGICAL SUPPLY — 47 items
BAG DECANTER FOR FLEXI CONT (MISCELLANEOUS) IMPLANT
BLADE SURG ROTATE 9660 (MISCELLANEOUS) IMPLANT
BNDG GAUZE ELAST 4 BULKY (GAUZE/BANDAGES/DRESSINGS) ×8 IMPLANT
CANISTER SUCTION 2500CC (MISCELLANEOUS) ×4 IMPLANT
COVER SURGICAL LIGHT HANDLE (MISCELLANEOUS) ×4 IMPLANT
DRAPE INCISE IOBAN 66X45 STRL (DRAPES) ×4 IMPLANT
DRAPE LAPAROSCOPIC ABDOMINAL (DRAPES) ×4 IMPLANT
DRAPE UTILITY XL STRL (DRAPES) ×8 IMPLANT
DRAPE WARM FLUID 44X44 (DRAPE) ×4 IMPLANT
DRSG PAD ABDOMINAL 8X10 ST (GAUZE/BANDAGES/DRESSINGS) ×4 IMPLANT
DRSG TEGADERM 4X4.75 (GAUZE/BANDAGES/DRESSINGS) ×4 IMPLANT
ELECT REM PT RETURN 9FT ADLT (ELECTROSURGICAL) ×4
ELECTRODE REM PT RTRN 9FT ADLT (ELECTROSURGICAL) ×2 IMPLANT
GAUZE SPONGE 4X4 12PLY STRL (GAUZE/BANDAGES/DRESSINGS) ×4 IMPLANT
GLOVE BIO SURGEON STRL SZ8 (GLOVE) ×4 IMPLANT
GLOVE BIOGEL PI IND STRL 7.0 (GLOVE) ×2 IMPLANT
GLOVE BIOGEL PI IND STRL 8 (GLOVE) ×2 IMPLANT
GLOVE BIOGEL PI INDICATOR 7.0 (GLOVE) ×2
GLOVE BIOGEL PI INDICATOR 8 (GLOVE) ×2
GLOVE SURG SIGNA 7.5 PF LTX (GLOVE) ×4 IMPLANT
GLOVE SURG SS PI 6.5 STRL IVOR (GLOVE) ×4 IMPLANT
GOWN STRL REUS W/ TWL LRG LVL3 (GOWN DISPOSABLE) ×5 IMPLANT
GOWN STRL REUS W/ TWL XL LVL3 (GOWN DISPOSABLE) ×2 IMPLANT
GOWN STRL REUS W/TWL LRG LVL3 (GOWN DISPOSABLE) ×12
GOWN STRL REUS W/TWL XL LVL3 (GOWN DISPOSABLE) ×4
KIT BASIN OR (CUSTOM PROCEDURE TRAY) ×4 IMPLANT
KIT ROOM TURNOVER OR (KITS) ×4 IMPLANT
NS IRRIG 1000ML POUR BTL (IV SOLUTION) ×4 IMPLANT
PACK GENERAL/GYN (CUSTOM PROCEDURE TRAY) ×4 IMPLANT
PAD ABD 8X10 STRL (GAUZE/BANDAGES/DRESSINGS) ×8 IMPLANT
PAD ARMBOARD 7.5X6 YLW CONV (MISCELLANEOUS) ×8 IMPLANT
SCRUB POVIDONE IODINE 4 OZ (MISCELLANEOUS) ×8 IMPLANT
SPONGE LAP 18X18 X RAY DECT (DISPOSABLE) IMPLANT
STAPLER CUT CVD 40MM BLUE (STAPLE) ×4 IMPLANT
STAPLER GUN LINEAR PROX 60 (STAPLE) ×4 IMPLANT
STAPLER PROXIMATE 75MM BLUE (STAPLE) ×4 IMPLANT
STAPLER VISISTAT 35W (STAPLE) ×4 IMPLANT
SUCTION POOLE TIP (SUCTIONS) ×4 IMPLANT
SUT PDS AB 1 CTX 36 (SUTURE) ×4 IMPLANT
SUT PDS AB 1 TP1 96 (SUTURE) ×8 IMPLANT
SUT SILK 2 0 SH CR/8 (SUTURE) ×4 IMPLANT
SUT SILK 3 0 SH CR/8 (SUTURE) ×4 IMPLANT
TOWEL OR 17X24 6PK STRL BLUE (TOWEL DISPOSABLE) ×4 IMPLANT
TOWEL OR 17X26 10 PK STRL BLUE (TOWEL DISPOSABLE) ×4 IMPLANT
UNDERPAD 30X30 INCONTINENT (UNDERPADS AND DIAPERS) IMPLANT
WATER STERILE IRR 1000ML POUR (IV SOLUTION) IMPLANT
YANKAUER SUCT BULB TIP NO VENT (SUCTIONS) ×4 IMPLANT

## 2015-11-21 NOTE — Progress Notes (Addendum)
Subjective: Pt intubated , on pressors   Objective: Filed Vitals:   11/21/15 1500 11/21/15 1538 11/21/15 1600 11/21/15 1618  BP:  111/57    Pulse: 74 76 70   Temp:    99.5 F (37.5 C)  TempSrc:    Axillary  Resp: 14 14 14    Height:      Weight:      SpO2: 100% 100% 94%    Weight change: -6 lb 6.3 oz (-2.9 kg)  Intake/Output Summary (Last 24 hours) at 11/21/15 1705 Last data filed at 11/21/15 1600  Gross per 24 hour  Intake 4449.33 ml  Output   2460 ml  Net 1989.33 ml    General:  Intubated, sedated   Neck:  JVP is diffilcut to assess  Heart: Regular rate and rhythm, without murmurs, rubs, gallops.  Lungs: Rel clear  SOme rhonchi   Exemities:  No edema.    Tele:  SR  Occasional paced beats    Lab Results: Results for orders placed or performed during the hospital encounter of 11/17/15 (from the past 24 hour(s))  Glucose, capillary     Status: None   Collection Time: 11/20/15  5:52 PM  Result Value Ref Range   Glucose-Capillary 97 65 - 99 mg/dL  Triglycerides     Status: Abnormal   Collection Time: 11/20/15  6:59 PM  Result Value Ref Range   Triglycerides 225 (H) <150 mg/dL  Basic metabolic panel     Status: Abnormal   Collection Time: 11/20/15  6:59 PM  Result Value Ref Range   Sodium 144 135 - 145 mmol/L   Potassium 4.0 3.5 - 5.1 mmol/L   Chloride 112 (H) 101 - 111 mmol/L   CO2 23 22 - 32 mmol/L   Glucose, Bld 100 (H) 65 - 99 mg/dL   BUN 32 (H) 6 - 20 mg/dL   Creatinine, Ser 2.32 (H) 0.61 - 1.24 mg/dL   Calcium 6.4 (LL) 8.9 - 10.3 mg/dL   GFR calc non Af Amer 27 (L) >60 mL/min   GFR calc Af Amer 32 (L) >60 mL/min   Anion gap 9 5 - 15  Glucose, capillary     Status: None   Collection Time: 11/20/15  7:33 PM  Result Value Ref Range   Glucose-Capillary 95 65 - 99 mg/dL   Comment 1 Notify RN    Comment 2 Document in Chart   Heparin level (unfractionated)     Status: Abnormal   Collection Time: 11/21/15 12:00 AM  Result Value Ref Range   Heparin  Unfractionated 0.11 (L) 0.30 - 0.70 IU/mL  Glucose, capillary     Status: Abnormal   Collection Time: 11/21/15 12:28 AM  Result Value Ref Range   Glucose-Capillary 109 (H) 65 - 99 mg/dL   Comment 1 Notify RN    Comment 2 Document in Chart   Glucose, capillary     Status: Abnormal   Collection Time: 11/21/15  4:03 AM  Result Value Ref Range   Glucose-Capillary 102 (H) 65 - 99 mg/dL   Comment 1 Notify RN    Comment 2 Document in Chart   CBC     Status: Abnormal   Collection Time: 11/21/15  4:17 AM  Result Value Ref Range   WBC 10.7 (H) 4.0 - 10.5 K/uL   RBC 3.58 (L) 4.22 - 5.81 MIL/uL   Hemoglobin 10.6 (L) 13.0 - 17.0 g/dL   HCT 32.8 (L) 39.0 - 52.0 %   MCV 91.6 78.0 -  100.0 fL   MCH 29.6 26.0 - 34.0 pg   MCHC 32.3 30.0 - 36.0 g/dL   RDW 14.1 11.5 - 15.5 %   Platelets 164 150 - 400 K/uL  Comprehensive metabolic panel     Status: Abnormal   Collection Time: 11/21/15  4:17 AM  Result Value Ref Range   Sodium 143 135 - 145 mmol/L   Potassium 3.4 (L) 3.5 - 5.1 mmol/L   Chloride 111 101 - 111 mmol/L   CO2 23 22 - 32 mmol/L   Glucose, Bld 123 (H) 65 - 99 mg/dL   BUN 24 (H) 6 - 20 mg/dL   Creatinine, Ser 1.88 (H) 0.61 - 1.24 mg/dL   Calcium 6.7 (L) 8.9 - 10.3 mg/dL   Total Protein 3.8 (L) 6.5 - 8.1 g/dL   Albumin 1.3 (L) 3.5 - 5.0 g/dL   AST 27 15 - 41 U/L   ALT 17 17 - 63 U/L   Alkaline Phosphatase 137 (H) 38 - 126 U/L   Total Bilirubin 1.5 (H) 0.3 - 1.2 mg/dL   GFR calc non Af Amer 35 (L) >60 mL/min   GFR calc Af Amer 41 (L) >60 mL/min   Anion gap 9 5 - 15  Magnesium     Status: None   Collection Time: 11/21/15  4:17 AM  Result Value Ref Range   Magnesium 1.7 1.7 - 2.4 mg/dL  Glucose, capillary     Status: Abnormal   Collection Time: 11/21/15  8:32 AM  Result Value Ref Range   Glucose-Capillary 106 (H) 65 - 99 mg/dL   Comment 1 Capillary Specimen    Comment 2 Notify RN   Glucose, capillary     Status: None   Collection Time: 11/21/15 11:37 AM  Result Value Ref Range     Glucose-Capillary 90 65 - 99 mg/dL   Comment 1 Capillary Specimen    Comment 2 Notify RN     Studies/Results: Dg Chest Port 1 View  11/21/2015  CLINICAL DATA:  Hypoxia EXAM: PORTABLE CHEST 1 VIEW COMPARISON:  November 19, 2015 FINDINGS: Endotracheal tube tip is 4.1 cm above the carina. Nasogastric tube tip and side port are below the diaphragm. Central catheter tip is in the superior cava. No pneumothorax. There is now mild cardiomegaly with bilateral pleural effusions. There is atelectatic change in the bases. The pulmonary vascularity appears within normal limits. No adenopathy evident. IMPRESSION: Tube and catheter positions as described without pneumothorax. Bilateral effusions with bibasilar atelectasis. Mild cardiomegaly. Suspect a degree of underlying congestive heart failure. Electronically Signed   By: Lowella Grip III M.D.   On: 11/21/2015 08:00    Medications: Reviewed  @PROBHOSP @  1  Rhythm  Pt in SR now  Occasional paced beats  Pacer wire is through L groin.  I would keep overnight and follow. Take out in AM if BP improved and occasional pacing   2  Acute systolic/diastolic CHF  Pt will need diuresis once BP improves   LOS: 4 days   Shawn [name] 11/21/2015, 5:05 PM

## 2015-11-21 NOTE — Op Note (Signed)
NAME:  Shawn [name], Shawn [name] NO.:  [id number]  MEDICAL RECORD NO.:  [id number]  LOCATION:  2S05C                        FACILITY:  [facility]  PHYSICIAN:  Coralie Keens, M.D. DATE OF BIRTH:  [birth year]  DATE OF PROCEDURE:  11/21/2015 DATE OF DISCHARGE:                              OPERATIVE REPORT   PREOPERATIVE DIAGNOSIS:  Open abdomen.  POSTOPERATIVE DIAGNOSIS:  Open abdomen.  PROCEDURE: 1. Exploratory laparotomy. 2. Small bowel anastomosis. 3. Closure of abdomen.  SURGEON:  Coralie Keens, M.D.  ANESTHESIA:  General.  ESTIMATED BLOOD LOSS:  Minimal.  INDICATIONS:  This is a 65 year old gentleman, who had undergone an emergent exploratory laparotomy for pneumatosis.  On a second look laparotomy, he was found to have ischemia of an old small bowel anastomosis which was resected.  Because of cardiac issues, his abdomen was left open and the anastomosis was not performed.  He now presents today for abdominal closure and bowel anastomosis.  FINDINGS:  The patient was found to have no other evidence of bowel ischemia.  A small bowel anastomosis was able to be performed, and I was able to close his abdominal fascia.  PROCEDURE IN DETAIL:  The patient was brought from the intensive care unit to the operating room, still intubated.  He was placed supine on the operating room table and general anesthesia was induced.  His wound VAC was then removed.  His abdomen was then prepped and draped in the usual sterile fashion.  I removed the rest of the VAC device and eviscerated the abdomen.  The entire small bowel appeared pink and viable along with the colon.  At this point, I identified the proximal and distal small bowel.  I reapproximated in side-to-side fashion with interrupted silk sutures.  I then created 2 enterotomies and then using the GIA 75 stapler, created a side-to-side small bowel anastomosis.  The open end was then closed with a TA-60 staple line.  I  reinforced the TA- 60 staple line with interrupted silk sutures.  I then closed the mesenteric defect with interrupted silk sutures as well.  The anastomosis appeared pink and well perfused.  I was able to easily milk small-bowel contents through the anastomosis as well.  At this point, I returned all contents of the abdominal cavity.  I then closed the patient's fascia easily with running #1 looped PDS suture.  The skin was then left open and packed with wet-to- dry saline gauze.  The patient tolerated the procedure well.  All the counts were correct at the end of the procedure.  The patient was left intubated and taken in a guarded condition from the operating room back to the intensive care unit.     Coralie Keens, M.D.     DB/MEDQ  D:  11/21/2015  T:  11/21/2015  Job:  [id number]

## 2015-11-21 NOTE — Progress Notes (Signed)
[facility] Progress Note Patient Name: Shawn [name] DOB: April 28, 1951 MRN: [id number]   Date of Service  11/21/2015  HPI/Events of Note  Nurse notified of widening of QRS. Serum potassium 3.4.BP stable at this time.Widening seems to be improving.  eICU Interventions  1. Abdomen on magnesium to a.m. Labs 2. Potassium chloride 10 mEq IV 6 runs     Intervention Category Major Interventions: Electrolyte abnormality - evaluation and management;Arrhythmia - evaluation and management  Tera Partridge 11/21/2015, 6:58 AM

## 2015-11-21 NOTE — Progress Notes (Signed)
ANTICOAGULATION CONSULT NOTE  Pharmacy Consult for Heparin  Indication: chest pain/ACS  Patient Measurements: Height: 5\' 10"  (177.8 cm) Weight: 181 lb 7 oz (82.3 kg) IBW/kg (Calculated) : 73  Vital Signs: Temp: 100 F (37.8 C) (03/21 0031) Temp Source: Axillary (03/21 0031) BP: 123/67 mmHg (03/20 2348) Pulse Rate: 79 (03/20 2348)  Labs:  Recent Labs  11/18/15 0440  11/19/15 0410 11/19/15 0813 11/19/15 1059 11/19/15 1635 11/19/15 1745 11/19/15 2330 11/20/15 0520 11/20/15 1859 11/21/15  HGB 12.9*  --  11.7*  --   --   --   --   --  10.7*  --   --   HCT 38.4*  --  36.0*  --   --   --   --   --  33.1*  --   --   PLT 287  --  219  --   --   --   --   --  184  --   --   APTT 31  --   --   --   --   --   --   --   --   --   --   LABPROT 17.8*  --   --   --   --   --   --   --   --   --   --   INR 1.46  --   --   --   --   --   --   --   --   --   --   HEPARINUNFRC  --   < >  --   --   --   --  0.18*  --  0.16*  --  0.11*  CREATININE 5.96*  --   --  4.67*  --   --   --   --   --  2.32*  --   TROPONINI  --   < >  --   --  0.63* 0.62*  --  0.56*  --   --   --   < > = values in this interval not displayed.  Estimated Creatinine Clearance: 31.9 mL/min (by C-G formula based on Cr of 2.32).   Assessment: 65 y.o. male with NSTEMI, s/p bowel resection 3/19, temporary pacer 3/20, and awaiting abdominal closure today, for heparin  Goal of Therapy:  Heparin level 0.3-0.7 units/ml Monitor platelets by anticoagulation protocol: Yes   Plan:  Increase Heparin 1800 units/hr Check heparin level in 8 hours.  Phillis Knack, PharmD, BCPS  11/21/2015 12:52 AM

## 2015-11-21 NOTE — Progress Notes (Signed)
Asked to review telemetry for rhythm stability Currently in accelerated junctional rhythm  Reviewed with Dr Charlett Blake for surgery from EP standpoint.   Chanetta Marshall, NP 11/21/2015 9:37 AM

## 2015-11-21 NOTE — Anesthesia Preprocedure Evaluation (Signed)
Anesthesia Evaluation  Patient identified by MRN, date of birth, ID band Patient awake    Reviewed: Allergy & Precautions, NPO status , Patient's Chart, lab work & pertinent test results  Airway Mallampati: Intubated       Dental   Pulmonary Current Smoker,    breath sounds clear to auscultation       Cardiovascular negative cardio ROS  + dysrhythmias + pacemaker  Rhythm:regular Rate:Normal     Neuro/Psych Anxiety Depression Bipolar Disorder    GI/Hepatic   Endo/Other    Renal/GU      Musculoskeletal  (+) Arthritis ,   Abdominal   Peds  Hematology   Anesthesia Other Findings   Reproductive/Obstetrics                             Anesthesia Physical Anesthesia Plan  ASA: III  Anesthesia Plan: General   Post-op Pain Management:    Induction: Inhalational  Airway Management Planned: Oral ETT  Additional Equipment:   Intra-op Plan:   Post-operative Plan: Post-operative intubation/ventilation  Informed Consent: I have reviewed the patients History and Physical, chart, labs and discussed the procedure including the risks, benefits and alternatives for the proposed anesthesia with the patient or authorized representative who has indicated his/her understanding and acceptance.     Plan Discussed with: CRNA, Anesthesiologist and Surgeon  Anesthesia Plan Comments:         Anesthesia Quick Evaluation

## 2015-11-21 NOTE — Transfer of Care (Signed)
Immediate Anesthesia Transfer of Care Note  Patient: Shawn [name]  Procedure(s) Performed: Procedure(s): SMALL BOWEL REPAIR, SMALL BOWEL ANATAMOSIS AND ABDOMINAL CLOSURE (N/A)  Patient Location: SICU  Anesthesia Type:General  Level of Consciousness: sedated and Patient remains intubated per anesthesia plan  Airway & Oxygen Therapy: Patient remains intubated per anesthesia plan and Patient placed on Ventilator (see vital sign flow sheet for setting)  Post-op Assessment: Report given to RN and Post -op Vital signs reviewed and stable  Post vital signs: Reviewed and stable  Last Vitals:  Filed Vitals:   11/21/15 1117 11/21/15 1139  BP: 100/60   Pulse: 92   Temp:  37.9 C  Resp: 14     Complications: No apparent anesthesia complications

## 2015-11-21 NOTE — Progress Notes (Signed)
Pharmacy Antibiotic Note  Shawn [name] is a 65 y.o. male admitted on 11/17/2015 with abdominal pain x 3 days associated with N/V fever and chills. Now with open abdomen, repeated trips to the OR. He is noted for OR for bowel anastomosis and abdominal closure today. He continues on vancomycin/zosyn and Eraxis for peritonitis.  -WBC= 10.6, tmax= 101.6, SCr= (down from 7.47 on 3.17 admit; baseline ~ 0.7-0.8), CrCl ~ 40   Plan: -Vancomycin 1g IV q24h -anidulafungin 100mg  IV q24  -Change Zosyn to 3.375gm IV q8h -Will follow renal function, cultures and clinical progress   Height: 5\' 10"  (177.8 cm) Weight: 175 lb 0.7 oz (79.4 kg) IBW/kg (Calculated) : 73  Temp (24hrs), Avg:99.9 F (37.7 C), Min:99 F (37.2 C), Max:100.9 F (38.3 C)   Recent Labs Lab 11/17/15 1419 11/17/15 2000 11/17/15 2035 11/18/15 0440 11/19/15 0410 11/19/15 0813 11/19/15 1104 11/19/15 1745 11/20/15 0520 11/20/15 0630 11/20/15 1859 11/21/15 0417  WBC  --  6.1  --  8.4 8.8  --   --   --  10.0  --   --  10.7*  CREATININE  --  6.23*  --  5.96*  --  4.67*  --   --   --   --  2.32* 1.88*  LATICACIDVEN 12.02*  --  2.7* 1.5  --   --  1.3  --   --  1.0  --   --   VANCORANDOM  --   --   --   --   --   --   --  10  --   --   --   --     Estimated Creatinine Clearance: 39.4 mL/min (by C-G formula based on Cr of 1.88).    Allergies  Allergen Reactions  . Codeine Nausea Only    flushing  . Depakote [Divalproex Sodium] Other (See Comments)    Makes me crazy    Antimicrobials this admission: Vanc 3/17 >> Zosyn 3/17 >> Eraxis 3/17 >>  Dose adjustments this admission: 3/19 VR = 31mcg/mL  Microbiology results: MRSA PCR neg 3/17 bld x2>>ngtd 3/19 bld x2>>ngtd 3/18 urine>>ngF 3/18 TA>>kleb pneumo (pan sensitive)  Thank you for allowing pharmacy to be a part of this patient's care.  Hildred Laser, Pharm D 11/21/2015 12:04 PM

## 2015-11-21 NOTE — Progress Notes (Signed)
Patient ID: Shawn [name], male   DOB: [birth year], 66 y.o.   MRN: [id number]  Plan to proceed to the OR today if ok with cardiology to reanastomosis the bowel and close the abdomen, I discussed this late yesterday with the patient's son who agrees with surgery

## 2015-11-21 NOTE — Op Note (Signed)
SMALL BOWEL REPAIR, SMALL BOWEL ANATAMOSIS AND ABDOMINAL CLOSURE  Procedure Note  DAIRE SPRADLING 11/17/2015 - 11/21/2015   Pre-op Diagnosis: OPEN ABDOMEN     Post-op Diagnosis: same  Procedure(s): EXPLORATORY LAPAROTOMY, SMALL BOWEL ANATAMOSIS AND ABDOMINAL CLOSURE  Surgeon(s): Coralie Keens, MD  Anesthesia: General  Staff:  Circulator: Milas Kocher, RN Relief Scrub: Christen Bame, RN Scrub Person: Rolan Bucco Circulator Assistant: Jolinda Croak, RN; Candie Mile, RN  Estimated Blood Loss: Minimal               Findings:  No further bowel ischemia          Estefany Goebel A   Date: 11/21/2015  Time: 12:49 PM

## 2015-11-21 NOTE — Progress Notes (Signed)
PULMONARY / CRITICAL CARE MEDICINE   Name: Shawn [name] MRN: [id number] DOB: [birth year]    ADMISSION DATE:  11/17/2015 CONSULTATION DATE:  003/17/2017  REFERRING MD:  Dr. Redmond Pulling with [facility]  CHIEF COMPLAINT:  Postoperative ventilator management, sepsis management   STUDIES:  3/17  CT abdomen and pelvis >> diffuse extensive small bowel pneumatosis with mild dilation throughout the abdomen and pelvisconsistent with small bowel ischemia. Diffuse central Mesentericand portal venous small gas, Associated diffuse small bowel ileus, extensive abdominal and aortic atherosclerosis, no significant pneumoperitoneum or abscess, cholelithiasis, colonic diverticulosis  CULTURES: 3/17 BCx2 >> 3/18 sputum >> abundant klebsiella >> R - ampicillin, otherwise sensitive  3/18  UC >> neg   ANTIBIOTICS: Zosyn 3/17 >>  Vanc 3/18 >> Eraxis 3/18 >>  LINES/TUBES: R IJ TLC 3/17 >>  A Line 3/17 >>  ETT 3/17 >>    SIGNIFICANT EVENTS: 3/17 Admission, exploratory laparotomy 3/18 Complete heart block 3/18 MI 3/19 OR for wound closure 3/20 Cath lab for temp pacemaker  3/20 -   RT reports pt returned from cath lab after temporary pacemaker insertion to holding, pending transfer to ICU.  Pt remains febrile to 101, requiring 12 mcg levophed   SUBJECTIVE/OVERNIGHT/INTERVAL HX 11/21/15 - EP reports acc jnl but has cleared patient for surgery. To OR today per CCCS notes for reanastamosis.  K given earlier today. Mag at 1.7. Curently on levophed gtt and fent gtt. Improving AKI.    VITAL SIGNS: BP 104/61 mmHg  Pulse 89  Temp(Src) 100.9 F (38.3 C) (Axillary)  Resp 16  Ht 5\' 10"  (1.778 m)  Wt 79.4 kg (175 lb 0.7 oz)  BMI 25.12 kg/m2  SpO2 93%  HEMODYNAMICS: CVP:  [5 mmHg] 5 mmHg  VENTILATOR SETTINGS: Vent Mode:  [-] PRVC FiO2 (%):  [40 %-100 %] 40 % Set Rate:  [14 bmp] 14 bmp Vt Set:  [590 mL] 590 mL PEEP:  [5 cmH20] 5 cmH20 Plateau Pressure:  [15 cmH20-18 cmH20] 15  cmH20  INTAKE / OUTPUT: I/O last 3 completed shifts: In: 5692 [I.V.:4662; Other:30; NG/GT:150; IV [id number] Out: [id number] [Urine:2990; Emesis/NG output:230; Drains:750]  PHYSICAL EXAMINATION: General:  Thin adult male in NAD on vent Neuro:  Sedate on vent, pupils 65mm  HEENT:  ETT, OGT in place. Cardiovascular:  s1s2 distant, regular Lungs: even/non-labored, diminished on right, coarse on left Abdomen:  Wound vac-in place c/d/i Musculoskeletal:  No acute deformities  Skin:  No rashes or lesions on anterior body. L femoral sheath c/d/i, no hematoma     LABS:  PULMONARY  Recent Labs Lab 11/17/15 1709 11/17/15 1908 11/17/15 2035 11/18/15 0415 11/19/15 0422 11/19/15 1019  PHART 7.372 7.447  --  7.410 7.387 7.311*  PCO2ART 53.9* 48.6*  --  38.4 38.0 43.2  PO2ART 308.0* 102.0*  --  131* 123.0* 81.0  HCO3 31.6* 33.5*  --  23.9 22.8 21.7  TCO2 33 35  --  25.0 24 23  O2SAT 100.0 98.0 62.5 97.3 99.0 94.0    CBC  Recent Labs Lab 11/19/15 0410 11/20/15 0520 11/21/15 0417  HGB 11.7* 10.7* 10.6*  HCT 36.0* 33.1* 32.8*  WBC 8.8 10.0 10.7*  PLT 219 184 164    COAGULATION  Recent Labs Lab 11/17/15 1400 11/17/15 2000 11/18/15 0440  INR 1.29 1.45 1.46    CARDIAC   Recent Labs Lab 11/18/15 1806 11/18/15 2345 11/19/15 1059 11/19/15 1635 11/19/15 2330  TROPONINI 1.23* 0.82* 0.63* 0.62* 0.56*   No results for input(s): PROBNP in the  last 168 hours.   CHEMISTRY  Recent Labs Lab 11/17/15 2000 11/18/15 0440 11/19/15 0410 11/19/15 0813 11/20/15 1859 11/21/15 0417  NA 147* 145  --  144 144 143  K 3.9 4.8  --  4.2 4.0 3.4*  CL 104 108  --  108 112* 111  CO2 24 23  --  20* 23 23  GLUCOSE 137* 112*  --  130* 100* 123*  BUN 57* 60*  --  61* 32* 24*  CREATININE 6.23* 5.96*  --  4.67* 2.32* 1.88*  CALCIUM 6.5* 5.8*  --  6.0* 6.4* 6.7*  MG 2.3 2.1 2.2  --   --  1.7  PHOS 4.5 4.2 4.9*  --   --   --    Estimated Creatinine Clearance: 39.4 mL/min (by C-G  formula based on Cr of 1.88).   LIVER  Recent Labs Lab 11/17/15 1400 11/17/15 2000 11/18/15 0440 11/21/15 0417  AST 39 27 28 27   ALT 13* 10* 12* 17  ALKPHOS 106 69 60 137*  BILITOT 0.6 0.8 0.9 1.5*  PROT 5.6* 4.1* 4.1* 3.8*  ALBUMIN 2.8* 2.0* 1.8* 1.3*  INR 1.29 1.45 1.46  --      INFECTIOUS  Recent Labs Lab 11/18/15 0440 11/19/15 1104 11/20/15 0630  LATICACIDVEN 1.5 1.3 1.0     ENDOCRINE CBG (last 3)   Recent Labs  11/21/15 0028 11/21/15 0403 11/21/15 0832  GLUCAP 109* 102* 106*         IMAGING x48h  - image(s) personally visualized  -   highlighted in bold Dg Chest Port 1 View  11/21/2015  CLINICAL DATA:  Hypoxia EXAM: PORTABLE CHEST 1 VIEW COMPARISON:  November 19, 2015 FINDINGS: Endotracheal tube tip is 4.1 cm above the carina. Nasogastric tube tip and side port are below the diaphragm. Central catheter tip is in the superior cava. No pneumothorax. There is now mild cardiomegaly with bilateral pleural effusions. There is atelectatic change in the bases. The pulmonary vascularity appears within normal limits. No adenopathy evident. IMPRESSION: Tube and catheter positions as described without pneumothorax. Bilateral effusions with bibasilar atelectasis. Mild cardiomegaly. Suspect a degree of underlying congestive heart failure. Electronically Signed   By: Lowella Grip III M.D.   On: 11/21/2015 08:00       DISCUSSION: 65 year old male with a PMH of ETOH, tobacco abuse and bipolar disorder who presented to the emergency department on 3/17 with a 3 day hx of abdominal pain, nausea, vomiting and constipation. Found to have CT concerning for diffuse abdominal pneumatosis and elevated lactic acid.  Patient was taken to OR for ex-lap, found to have viable gut, diffuse pneumatosis (no exact cause) & no evidence of dead bowel. He was left open and taken to the MICU.  He developed CHB on 3/18 and MI.  Later 3/19, was taken to OR for wound closure but developed CHB  in OR and VAC was applied.  Subsequently taken to cath lab 3/20 for temporary pacemaker insertion.     ASSESSMENT / PLAN:  PULMONARY A: Acute hypoxic respiratory failure  Tobacco Abuse \    - no extubation 3/21 due to impending surgery  P:   PRVC 8cc/kg Wean PEEP / FiO2 for sats > 92% Intermittent CXR PRN albuterol  VAP prevention Not candidate for SBT  CARDIOVASCULAR A:  Septic shock- in setting of pneumoatosis, klebsiella + sputum  Complete Heart Block - s/p temporary pacemaker insertion 3/20 MI    - acc junctional and on pressors. Heparin on hold  for impending surgery  P:  Levophed for MAP > 65  Continue ASA ICU monitoring  Cardiology following, appreciate assistance  CVP monitoring Arterial line for blood pressure monitoring  Likely will need LHC at some point Heparin gtt per pharmacy   RENAL A:   Acute kidney injury - in setting of sepsis, MI, CHB, hypotension  Hypoimag   P:   Replete mag 11/21/15 Trend BMP / UOP  Foley catheter D51/2NS @ 100 ml/hr  GASTROINTESTINAL A:   Small bowel pneumatosis - viable bowel, uncertain etiology, unable to close 3/19 due to CHB, VAC applied Question upper GI bleed   - for reanastomosis 11/21/15  P:   PPI QD  Postoperative management per surgery  VAC mgmt per CCS / WOC  NPO  HEMATOLOGIC A:   Anemia - suspect of critical illness + component blood loss    P:  Trend CBC  Monitor for bleeding  Heparin gtt as above   INFECTIOUS A:   Septic shock secondary to peritonitis  - still on levophed P:   ABX / Cultures as above  Trend WBC / Fever curve  CCS following   ENDOCRINE A:   Hyperglycemia   P:   SSI with CBG Q4   NEUROLOGIC A:   Post Operative Pain Sedation needs for ventilator synchrony P:   RASS goal: -1 Fentanyl drip for pain  PRN versed for sedation   FAMILY  - Updates: No family at bedside 3/20.  Sister at bedside 11/21/15  - Inter-disciplinary family meet or Palliative Care  meeting due by:  3/24     Dr. Brand Males, M.D., [facility].C.P Pulmonary and Critical Care Medicine Staff Physician [facility] Pager: [phone], If no answer or between  15:00h - 7:00h: call [phone]  11/21/2015 10:37 AM

## 2015-11-22 ENCOUNTER — Inpatient Hospital Stay (HOSPITAL_COMMUNITY): Payer: Medicare Other

## 2015-11-22 ENCOUNTER — Encounter (HOSPITAL_COMMUNITY): Payer: Self-pay | Admitting: Surgery

## 2015-11-22 LAB — GLUCOSE, CAPILLARY
GLUCOSE-CAPILLARY: 76 mg/dL (ref 65–99)
GLUCOSE-CAPILLARY: 89 mg/dL (ref 65–99)
Glucose-Capillary: 103 mg/dL — ABNORMAL HIGH (ref 65–99)
Glucose-Capillary: 81 mg/dL (ref 65–99)
Glucose-Capillary: 83 mg/dL (ref 65–99)
Glucose-Capillary: 95 mg/dL (ref 65–99)

## 2015-11-22 LAB — CULTURE, BLOOD (ROUTINE X 2)
CULTURE: NO GROWTH
Culture: NO GROWTH

## 2015-11-22 LAB — PHOSPHORUS: PHOSPHORUS: 2 mg/dL — AB (ref 2.5–4.6)

## 2015-11-22 LAB — CBC
HCT: 31.9 % — ABNORMAL LOW (ref 39.0–52.0)
Hemoglobin: 10.6 g/dL — ABNORMAL LOW (ref 13.0–17.0)
MCH: 30.7 pg (ref 26.0–34.0)
MCHC: 33.2 g/dL (ref 30.0–36.0)
MCV: 92.5 fL (ref 78.0–100.0)
PLATELETS: 145 10*3/uL — AB (ref 150–400)
RBC: 3.45 MIL/uL — AB (ref 4.22–5.81)
RDW: 14.4 % (ref 11.5–15.5)
WBC: 11.3 10*3/uL — ABNORMAL HIGH (ref 4.0–10.5)

## 2015-11-22 LAB — BASIC METABOLIC PANEL
Anion gap: 8 (ref 5–15)
BUN: 14 mg/dL (ref 6–20)
CALCIUM: 7 mg/dL — AB (ref 8.9–10.3)
CHLORIDE: 111 mmol/L (ref 101–111)
CO2: 22 mmol/L (ref 22–32)
CREATININE: 1.38 mg/dL — AB (ref 0.61–1.24)
GFR, EST AFRICAN AMERICAN: 60 mL/min — AB (ref >60)
GFR, EST NON AFRICAN AMERICAN: 51 mL/min — AB (ref >60)
Glucose, Bld: 127 mg/dL — ABNORMAL HIGH (ref 65–99)
Potassium: 3.9 mmol/L (ref 3.5–5.1)
SODIUM: 141 mmol/L (ref 135–145)

## 2015-11-22 LAB — TRIGLYCERIDES: TRIGLYCERIDES: 175 mg/dL — AB (ref <150)

## 2015-11-22 LAB — MAGNESIUM: MAGNESIUM: 1.7 mg/dL (ref 1.7–2.4)

## 2015-11-22 MED ORDER — FUROSEMIDE 10 MG/ML IJ SOLN
40.0000 mg | Freq: Three times a day (TID) | INTRAMUSCULAR | Status: DC
  Administered 2015-11-22 – 2015-11-23 (×4): 40 mg via INTRAVENOUS
  Filled 2015-11-22 (×4): qty 4

## 2015-11-22 MED ORDER — MAGNESIUM SULFATE 2 GM/50ML IV SOLN
2.0000 g | Freq: Once | INTRAVENOUS | Status: AC
  Administered 2015-11-22: 2 g via INTRAVENOUS
  Filled 2015-11-22: qty 50

## 2015-11-22 MED ORDER — NOREPINEPHRINE BITARTRATE 1 MG/ML IV SOLN
0.0000 ug/min | INTRAVENOUS | Status: DC
  Administered 2015-11-23: 20 ug/min via INTRAVENOUS
  Filled 2015-11-22 (×3): qty 4

## 2015-11-22 MED ORDER — POTASSIUM PHOSPHATES 15 MMOLE/5ML IV SOLN
10.0000 mmol | Freq: Once | INTRAVENOUS | Status: AC
  Administered 2015-11-22: 10 mmol via INTRAVENOUS
  Filled 2015-11-22: qty 3.33

## 2015-11-22 NOTE — Progress Notes (Signed)
Site area: left groin femoral venous sheath and temporary pacing wire Site Prior to Removal:  Level  0 Pressure Applied For:  15 minutes Manual:   yes Patient Status During Pull:  stable Post Pull Site:  Level  0 Post Pull Instructions Given:------patient ventilated and on fentanyl. Patient's nurse, Elmyra Ricks, in post sheath pull to look at lt groin. Patient supine w/left leg straight. Post Pull Pulses Present: yes Dressing Applied:  Small tegaderm Bedrest begins @  [id number] Comments:  No complications. VSS

## 2015-11-22 NOTE — Progress Notes (Signed)
1 Day Post-Op  Subjective: On vent, awake hemodyn stable  Objective: Vital signs in last 24 hours: Temp:  [98.5 F (36.9 C)-100.5 F (38.1 C)] 100.3 F (37.9 C) (03/22 0800) Pulse Rate:  [70-113] 113 (03/22 0816) Resp:  [14-16] 16 (03/22 0800) BP: (88-111)/(52-61) 94/57 mmHg (03/22 0816) SpO2:  [92 %-100 %] 94 % (03/22 0816) Arterial Line BP: (94-161)/(47-90) 119/48 mmHg (03/22 0800) FiO2 (%):  [40 %-100 %] 40 % (03/22 0816)    Intake/Output from previous day: 03/21 0701 - 03/22 0700 In: 4665.5 [I.V.:3933; NG/GT:60; IV Piggyback:642.5] Out: 1695 [Urine:1695] Intake/Output this shift: Total I/O In: 150 [I.V.:150] Out: 100 [Urine:100]  Abdomen soft, non-distended  Lab Results:   Recent Labs  11/21/15 0417 11/22/15 0538  WBC 10.7* 11.3*  HGB 10.6* 10.6*  HCT 32.8* 31.9*  PLT 164 145*   BMET  Recent Labs  11/21/15 0417 11/22/15 0538  NA 143 141  K 3.4* 3.9  CL 111 111  CO2 23 22  GLUCOSE 123* 127*  BUN 24* 14  CREATININE 1.88* 1.38*  CALCIUM 6.7* 7.0*   PT/INR No results for input(s): LABPROT, INR in the last 72 hours. ABG  Recent Labs  11/19/15 1019  PHART 7.311*  HCO3 21.7    Studies/Results: Dg Chest Port 1 View  11/21/2015  CLINICAL DATA:  Hypoxia EXAM: PORTABLE CHEST 1 VIEW COMPARISON:  November 19, 2015 FINDINGS: Endotracheal tube tip is 4.1 cm above the carina. Nasogastric tube tip and side port are below the diaphragm. Central catheter tip is in the superior cava. No pneumothorax. There is now mild cardiomegaly with bilateral pleural effusions. There is atelectatic change in the bases. The pulmonary vascularity appears within normal limits. No adenopathy evident. IMPRESSION: Tube and catheter positions as described without pneumothorax. Bilateral effusions with bibasilar atelectasis. Mild cardiomegaly. Suspect a degree of underlying congestive heart failure. Electronically Signed   By: Lowella Grip III M.D.   On: 11/21/2015 08:00     Anti-infectives: Anti-infectives    Start     Dose/Rate Route Frequency Ordered Stop   11/21/15 1400  piperacillin-tazobactam (ZOSYN) IVPB 3.375 g     3.375 g 12.5 mL/hr over 240 Minutes Intravenous 3 times per day 11/21/15 1205     11/19/15 2000  vancomycin (VANCOCIN) IVPB 1000 mg/200 mL premix     1,000 mg 200 mL/hr over 60 Minutes Intravenous Every 24 hours 11/19/15 1857     11/18/15 1800  anidulafungin (ERAXIS) 100 mg in sodium chloride 0.9 % 100 mL IVPB     100 mg over 90 Minutes Intravenous Every 24 hours 11/17/15 1812     11/17/15 2200  piperacillin-tazobactam (ZOSYN) IVPB 2.25 g  Status:  Discontinued     2.25 g 100 mL/hr over 30 Minutes Intravenous 3 times per day 11/17/15 1930 11/21/15 1205   11/17/15 1830  anidulafungin (ERAXIS) 200 mg in sodium chloride 0.9 % 200 mL IVPB     200 mg over 180 Minutes Intravenous  Once 11/17/15 1812 11/17/15 2203   11/17/15 1815  vancomycin (VANCOCIN) 1,500 mg in sodium chloride 0.9 % 500 mL IVPB     1,500 mg 250 mL/hr over 120 Minutes Intravenous  Once 11/17/15 1813 11/17/15 2103   11/17/15 1530  piperacillin-tazobactam (ZOSYN) IVPB 3.375 g     3.375 g 12.5 mL/hr over 240 Minutes Intravenous NOW 11/17/15 1521 11/17/15 1942   11/17/15 1515  piperacillin-tazobactam (ZOSYN) IVPB 4.5 g  Status:  Discontinued     4.5 g 200 mL/hr over  30 Minutes Intravenous  Once 11/17/15 1505 11/18/15 0040      Assessment/Plan: s/p Procedure(s): SMALL BOWEL REPAIR, SMALL BOWEL ANATAMOSIS AND ABDOMINAL CLOSURE (N/A)  Start wet to dry dressing changes Ok to resume heparin Continue npo and NG Abdomen now closed so no further trips to OR planned  LOS: 5 days    Shawn [name] A 11/22/2015

## 2015-11-22 NOTE — Progress Notes (Signed)
Subjective: Pt intubated  Awake   Objective: Filed Vitals:   11/22/15 1200 11/22/15 1215 11/22/15 1230 11/22/15 1245  BP: 92/62     Pulse: 104 102 99 102  Temp:      TempSrc:      Resp: 15 15 16 16   Height:      Weight:      SpO2: 96% 92% 93% 93%   Weight change:   Intake/Output Summary (Last 24 hours) at 11/22/15 1255 Last data filed at 11/22/15 1200  Gross per 24 hour  Intake 4906.91 ml  Output   2300 ml  Net 2606.91 ml    General: Awake  Intubated   Neck:  Full   Heart: Regular rate and rhythm, without murmurs, rubs, gallops.  Lungs: Coarse BS   Exemities:  No siignif edema   edema.    Tele  SR / ST    Lab Results: Results for orders placed or performed during the hospital encounter of 11/17/15 (from the past 24 hour(s))  Glucose, capillary     Status: None   Collection Time: 11/21/15  4:16 PM  Result Value Ref Range   Glucose-Capillary 93 65 - 99 mg/dL   Comment 1 Capillary Specimen    Comment 2 Notify RN   Glucose, capillary     Status: None   Collection Time: 11/21/15  7:28 PM  Result Value Ref Range   Glucose-Capillary 95 65 - 99 mg/dL   Comment 1 Capillary Specimen    Comment 2 Notify RN    Comment 3 Document in Chart   Glucose, capillary     Status: None   Collection Time: 11/21/15 11:50 PM  Result Value Ref Range   Glucose-Capillary 81 65 - 99 mg/dL   Comment 1 Capillary Specimen    Comment 2 Notify RN    Comment 3 Document in Chart   Glucose, capillary     Status: None   Collection Time: 11/22/15  4:02 AM  Result Value Ref Range   Glucose-Capillary 76 65 - 99 mg/dL   Comment 1 Capillary Specimen    Comment 2 Notify RN    Comment 3 Document in Chart   CBC     Status: Abnormal   Collection Time: 11/22/15  5:38 AM  Result Value Ref Range   WBC 11.3 (H) 4.0 - 10.5 K/uL   RBC 3.45 (L) 4.22 - 5.81 MIL/uL   Hemoglobin 10.6 (L) 13.0 - 17.0 g/dL   HCT 31.9 (L) 39.0 - 52.0 %   MCV 92.5 78.0 - 100.0 fL   MCH 30.7 26.0 - 34.0 pg   MCHC 33.2  30.0 - 36.0 g/dL   RDW 14.4 11.5 - 15.5 %   Platelets 145 (L) 150 - 400 K/uL  Magnesium     Status: None   Collection Time: 11/22/15  5:38 AM  Result Value Ref Range   Magnesium 1.7 1.7 - 2.4 mg/dL  Phosphorus     Status: Abnormal   Collection Time: 11/22/15  5:38 AM  Result Value Ref Range   Phosphorus 2.0 (L) 2.5 - 4.6 mg/dL  Basic metabolic panel     Status: Abnormal   Collection Time: 11/22/15  5:38 AM  Result Value Ref Range   Sodium 141 135 - 145 mmol/L   Potassium 3.9 3.5 - 5.1 mmol/L   Chloride 111 101 - 111 mmol/L   CO2 22 22 - 32 mmol/L   Glucose, Bld 127 (H) 65 - 99 mg/dL   BUN  14 6 - 20 mg/dL   Creatinine, Ser 1.38 (H) 0.61 - 1.24 mg/dL   Calcium 7.0 (L) 8.9 - 10.3 mg/dL   GFR calc non Af Amer 51 (L) >60 mL/min   GFR calc Af Amer 60 (L) >60 mL/min   Anion gap 8 5 - 15  Triglycerides     Status: Abnormal   Collection Time: 11/22/15  5:38 AM  Result Value Ref Range   Triglycerides 175 (H) <150 mg/dL  Glucose, capillary     Status: None   Collection Time: 11/22/15  8:12 AM  Result Value Ref Range   Glucose-Capillary 83 65 - 99 mg/dL   Comment 1 Arterial Specimen     Studies/Results: Dg Chest Port 1 View  11/22/2015  CLINICAL DATA:  Endotracheal tube present [id number] EXAM: PORTABLE CHEST - 1 VIEW COMPARISON:  the previous day's study FINDINGS: Endotracheal tube, nasogastric tube, and right IJ central line are stable in position. Probable layering pleural effusions bilaterally right greater than left. Probable adjacent atelectasis/ consolidation in the lung bases. Central pulmonary vascular congestion. Heart size upper limits normal for technique. No pneumothorax. Visualized skeletal structures are unremarkable. IMPRESSION: 1. Persistent layering pleural effusions and bibasilar atelectasis/consolidation. 2.  Support hardware stable in position. Electronically Signed   By: Lucrezia Europe M.D.   On: 11/22/2015 10:57    Medications: REviewed  @PROBHOSP @  1  Bradycardia     Pt has had not recurrent spells  Would d/c pacer.    2  Acute on chronic systolic CHF   Volume status is up but BP is limiting diuresis  Remains on pressors.    LOS: 5 days   Shawn [name] 11/22/2015, 12:55 PM

## 2015-11-22 NOTE — Progress Notes (Signed)
Patient ID: Shawn [name], male   DOB: [birth year], 65 y.o.   MRN: [id number]     [place]      [address] [place], [place] [address]    Phone: [phone] FAX: [phone]     Subjective: Low grade temps.  No NGT output. Off pressors.   Objective:  Vital signs:  Filed Vitals:   11/22/15 0300 11/22/15 0328 11/22/15 0400 11/22/15 0500  BP:      Pulse:  97    Temp:   98.5 F (36.9 C)   TempSrc:   Oral   Resp: '14 14 14 14  '$ Height:      Weight:      SpO2:  95%         Intake/Output   Yesterday:  03/21 0701 - 03/22 0700 In: 4515.5 [I.V.:3783; NG/GT:60; IV Piggyback:642.5] Out: 1695 [Urine:1695] This shift:    Physical Exam: General: Pt awake on the vent.  Abdomen: Soft.  Nondistended.+bs, abdomen is soft.  Wound is open, fascia separating inferior of the wound.   No evidence of peritonitis.  No incarcerated hernias.  Skin: cool   Problem List:   Active Problems:   Pressure ulcer   Septic shock (HCC)   Complete heart block (HCC)   Malnutrition of moderate degree   Cardiomyopathy, ischemic   Acute respiratory failure with hypoxemia (HCC)   Acute on chronic combined systolic and diastolic CHF (congestive heart failure) (HCC)    Results:   Labs: Results for orders placed or performed during the hospital encounter of 11/17/15 (from the past 48 hour(s))  Glucose, capillary     Status: None   Collection Time: 11/20/15  5:52 PM  Result Value Ref Range   Glucose-Capillary 97 65 - 99 mg/dL  Triglycerides     Status: Abnormal   Collection Time: 11/20/15  6:59 PM  Result Value Ref Range   Triglycerides 225 (H) <150 mg/dL  Basic metabolic panel     Status: Abnormal   Collection Time: 11/20/15  6:59 PM  Result Value Ref Range   Sodium 144 135 - 145 mmol/L   Potassium 4.0 3.5 - 5.1 mmol/L   Chloride 112 (H) 101 - 111 mmol/L   CO2 23 22 - 32 mmol/L   Glucose, Bld 100 (H) 65 - 99 mg/dL   BUN 32 (H) 6 - 20  mg/dL   Creatinine, Ser 2.32 (H) 0.61 - 1.24 mg/dL    Comment: DELTA CHECK NOTED   Calcium 6.4 (LL) 8.9 - 10.3 mg/dL    Comment: CRITICAL RESULT CALLED TO, READ BACK BY AND VERIFIED WITH: J COBLE,RN 1947 11/20/15 D BRADLEY    GFR calc non Af Amer 27 (L) >60 mL/min   GFR calc Af Amer 32 (L) >60 mL/min    Comment: (NOTE) The eGFR has been calculated using the CKD EPI equation. This calculation has not been validated in all clinical situations. eGFR's persistently <60 mL/min signify possible Chronic Kidney Disease.    Anion gap 9 5 - 15  Glucose, capillary     Status: None   Collection Time: 11/20/15  7:33 PM  Result Value Ref Range   Glucose-Capillary 95 65 - 99 mg/dL   Comment 1 Notify RN    Comment 2 Document in Chart   Heparin level (unfractionated)     Status: Abnormal   Collection Time: 11/21/15 12:00 AM  Result Value Ref Range   Heparin Unfractionated 0.11 (L) 0.30 - 0.70  IU/mL    Comment:        IF HEPARIN RESULTS ARE BELOW EXPECTED VALUES, AND PATIENT DOSAGE HAS BEEN CONFIRMED, SUGGEST FOLLOW UP TESTING OF ANTITHROMBIN III LEVELS.   Glucose, capillary     Status: Abnormal   Collection Time: 11/21/15 12:28 AM  Result Value Ref Range   Glucose-Capillary 109 (H) 65 - 99 mg/dL   Comment 1 Notify RN    Comment 2 Document in Chart   Glucose, capillary     Status: Abnormal   Collection Time: 11/21/15  4:03 AM  Result Value Ref Range   Glucose-Capillary 102 (H) 65 - 99 mg/dL   Comment 1 Notify RN    Comment 2 Document in Chart   CBC     Status: Abnormal   Collection Time: 11/21/15  4:17 AM  Result Value Ref Range   WBC 10.7 (H) 4.0 - 10.5 K/uL   RBC 3.58 (L) 4.22 - 5.81 MIL/uL   Hemoglobin 10.6 (L) 13.0 - 17.0 g/dL   HCT 32.8 (L) 39.0 - 52.0 %   MCV 91.6 78.0 - 100.0 fL   MCH 29.6 26.0 - 34.0 pg   MCHC 32.3 30.0 - 36.0 g/dL   RDW 14.1 11.5 - 15.5 %   Platelets 164 150 - 400 K/uL  Comprehensive metabolic panel     Status: Abnormal   Collection Time: 11/21/15   4:17 AM  Result Value Ref Range   Sodium 143 135 - 145 mmol/L   Potassium 3.4 (L) 3.5 - 5.1 mmol/L   Chloride 111 101 - 111 mmol/L   CO2 23 22 - 32 mmol/L   Glucose, Bld 123 (H) 65 - 99 mg/dL   BUN 24 (H) 6 - 20 mg/dL   Creatinine, Ser 1.88 (H) 0.61 - 1.24 mg/dL   Calcium 6.7 (L) 8.9 - 10.3 mg/dL   Total Protein 3.8 (L) 6.5 - 8.1 g/dL   Albumin 1.3 (L) 3.5 - 5.0 g/dL   AST 27 15 - 41 U/L   ALT 17 17 - 63 U/L   Alkaline Phosphatase 137 (H) 38 - 126 U/L   Total Bilirubin 1.5 (H) 0.3 - 1.2 mg/dL   GFR calc non Af Amer 35 (L) >60 mL/min   GFR calc Af Amer 41 (L) >60 mL/min    Comment: (NOTE) The eGFR has been calculated using the CKD EPI equation. This calculation has not been validated in all clinical situations. eGFR's persistently <60 mL/min signify possible Chronic Kidney Disease.    Anion gap 9 5 - 15  Magnesium     Status: None   Collection Time: 11/21/15  4:17 AM  Result Value Ref Range   Magnesium 1.7 1.7 - 2.4 mg/dL  Glucose, capillary     Status: Abnormal   Collection Time: 11/21/15  8:32 AM  Result Value Ref Range   Glucose-Capillary 106 (H) 65 - 99 mg/dL   Comment 1 Capillary Specimen    Comment 2 Notify RN   Glucose, capillary     Status: None   Collection Time: 11/21/15 11:37 AM  Result Value Ref Range   Glucose-Capillary 90 65 - 99 mg/dL   Comment 1 Capillary Specimen    Comment 2 Notify RN   Glucose, capillary     Status: None   Collection Time: 11/21/15  4:16 PM  Result Value Ref Range   Glucose-Capillary 93 65 - 99 mg/dL   Comment 1 Capillary Specimen    Comment 2 Notify RN   Glucose, capillary  Status: None   Collection Time: 11/21/15  7:28 PM  Result Value Ref Range   Glucose-Capillary 95 65 - 99 mg/dL   Comment 1 Capillary Specimen    Comment 2 Notify RN    Comment 3 Document in Chart   Glucose, capillary     Status: None   Collection Time: 11/21/15 11:50 PM  Result Value Ref Range   Glucose-Capillary 81 65 - 99 mg/dL   Comment 1 Capillary  Specimen    Comment 2 Notify RN    Comment 3 Document in Chart   Glucose, capillary     Status: None   Collection Time: 11/22/15  4:02 AM  Result Value Ref Range   Glucose-Capillary 76 65 - 99 mg/dL   Comment 1 Capillary Specimen    Comment 2 Notify RN    Comment 3 Document in Chart   CBC     Status: Abnormal   Collection Time: 11/22/15  5:38 AM  Result Value Ref Range   WBC 11.3 (H) 4.0 - 10.5 K/uL   RBC 3.45 (L) 4.22 - 5.81 MIL/uL   Hemoglobin 10.6 (L) 13.0 - 17.0 g/dL   HCT 31.9 (L) 39.0 - 52.0 %   MCV 92.5 78.0 - 100.0 fL   MCH 30.7 26.0 - 34.0 pg   MCHC 33.2 30.0 - 36.0 g/dL   RDW 14.4 11.5 - 15.5 %   Platelets 145 (L) 150 - 400 K/uL  Magnesium     Status: None   Collection Time: 11/22/15  5:38 AM  Result Value Ref Range   Magnesium 1.7 1.7 - 2.4 mg/dL  Phosphorus     Status: Abnormal   Collection Time: 11/22/15  5:38 AM  Result Value Ref Range   Phosphorus 2.0 (L) 2.5 - 4.6 mg/dL  Basic metabolic panel     Status: Abnormal   Collection Time: 11/22/15  5:38 AM  Result Value Ref Range   Sodium 141 135 - 145 mmol/L   Potassium 3.9 3.5 - 5.1 mmol/L   Chloride 111 101 - 111 mmol/L   CO2 22 22 - 32 mmol/L   Glucose, Bld 127 (H) 65 - 99 mg/dL   BUN 14 6 - 20 mg/dL   Creatinine, Ser 1.38 (H) 0.61 - 1.24 mg/dL   Calcium 7.0 (L) 8.9 - 10.3 mg/dL   GFR calc non Af Amer 51 (L) >60 mL/min   GFR calc Af Amer 60 (L) >60 mL/min    Comment: (NOTE) The eGFR has been calculated using the CKD EPI equation. This calculation has not been validated in all clinical situations. eGFR's persistently <60 mL/min signify possible Chronic Kidney Disease.    Anion gap 8 5 - 15  Triglycerides     Status: Abnormal   Collection Time: 11/22/15  5:38 AM  Result Value Ref Range   Triglycerides 175 (H) <150 mg/dL    Imaging / Studies: Dg Chest Port 1 View  11/21/2015  CLINICAL DATA:  Hypoxia EXAM: PORTABLE CHEST 1 VIEW COMPARISON:  November 19, 2015 FINDINGS: Endotracheal tube tip is 4.1 cm  above the carina. Nasogastric tube tip and side port are below the diaphragm. Central catheter tip is in the superior cava. No pneumothorax. There is now mild cardiomegaly with bilateral pleural effusions. There is atelectatic change in the bases. The pulmonary vascularity appears within normal limits. No adenopathy evident. IMPRESSION: Tube and catheter positions as described without pneumothorax. Bilateral effusions with bibasilar atelectasis. Mild cardiomegaly. Suspect a degree of underlying congestive heart failure. Electronically  Signed   By: Lowella Grip III M.D.   On: 11/21/2015 08:00    Medications / Allergies:  Scheduled Meds: . anidulafungin  100 mg Intravenous Q24H  . antiseptic oral rinse  7 mL Mouth Rinse QID  . aspirin  81 mg Per NG tube Daily  . chlorhexidine gluconate  15 mL Mouth Rinse BID  . fentaNYL (SUBLIMAZE) injection  50 mcg Intravenous Once  . fentaNYL (SUBLIMAZE) injection  50 mcg Intravenous Once  . insulin aspart  0-9 Units Subcutaneous 6 times per day  . pantoprazole (PROTONIX) IV  40 mg Intravenous QHS  . piperacillin-tazobactam (ZOSYN)  IV  3.375 g Intravenous 3 times per day  . sodium chloride flush  3 mL Intravenous Q12H  . vancomycin  1,000 mg Intravenous Q24H   Continuous Infusions: . dextrose 5 % and 0.45% NaCl 100 mL/hr (11/22/15 0549)  . fentaNYL infusion INTRAVENOUS 300 mcg/hr (11/22/15 0500)  . norepinephrine (LEVOPHED) Adult infusion Stopped (11/21/15 1832)   PRN Meds:.sodium chloride, acetaminophen, acetaminophen, albuterol, fentaNYL, Gerhardt's butt cream, midazolam, ondansetron (ZOFRAN) IV, ondansetron **OR** [DISCONTINUED] ondansetron (ZOFRAN) IV, sodium chloride flush, white petrolatum  Antibiotics: Anti-infectives    Start     Dose/Rate Route Frequency Ordered Stop   11/21/15 1400  piperacillin-tazobactam (ZOSYN) IVPB 3.375 g     3.375 g 12.5 mL/hr over 240 Minutes Intravenous 3 times per day 11/21/15 1205     11/19/15 2000  vancomycin  (VANCOCIN) IVPB 1000 mg/200 mL premix     1,000 mg 200 mL/hr over 60 Minutes Intravenous Every 24 hours 11/19/15 1857     11/18/15 1800  anidulafungin (ERAXIS) 100 mg in sodium chloride 0.9 % 100 mL IVPB     100 mg over 90 Minutes Intravenous Every 24 hours 11/17/15 1812     11/17/15 2200  piperacillin-tazobactam (ZOSYN) IVPB 2.25 g  Status:  Discontinued     2.25 g 100 mL/hr over 30 Minutes Intravenous 3 times per day 11/17/15 1930 11/21/15 1205   11/17/15 1830  anidulafungin (ERAXIS) 200 mg in sodium chloride 0.9 % 200 mL IVPB     200 mg over 180 Minutes Intravenous  Once 11/17/15 1812 11/17/15 2203   11/17/15 1815  vancomycin (VANCOCIN) 1,500 mg in sodium chloride 0.9 % 500 mL IVPB     1,500 mg 250 mL/hr over 120 Minutes Intravenous  Once 11/17/15 1813 11/17/15 2103   11/17/15 1530  piperacillin-tazobactam (ZOSYN) IVPB 3.375 g     3.375 g 12.5 mL/hr over 240 Minutes Intravenous NOW 11/17/15 1521 11/17/15 1942   11/17/15 1515  piperacillin-tazobactam (ZOSYN) IVPB 4.5 g  Status:  Discontinued     4.5 g 200 mL/hr over 30 Minutes Intravenous  Once 11/17/15 1505 11/18/15 0040        Assessment/Plan Pneumatosis  Exploratory laparotomy 11/17/15--Dr. Georgette Dover Exploratory laparotomy, SBR 11/19/15--Dr. Rosendo Gros Exploratory laparotomy, SB anastomosis and closure--Dr. Ninfa Linden  -continue NGT decompression, will consider clamping soon and trickle TF -watch wound(fascia starting to separate inferior aspect of the wound) -BID wet to dry dressing changes  -wean to extubate per primary team  VTE prophylaxis-SCD, may add chemical VTE prophylaxis from a surgical standpoint   Erby Pian, [facility] Pager [phone](7A-4:30P) For consults and floor pages call [phone](7A-4:30P)   11/22/2015 8:10 AM

## 2015-11-22 NOTE — Progress Notes (Signed)
PULMONARY / CRITICAL CARE MEDICINE   Name: Shawn [name] MRN: [id number] DOB: 1951/08/22    ADMISSION DATE:  11/17/2015 CONSULTATION DATE:  003/17/2017  REFERRING MD:  Dr. Redmond Pulling with [facility]  CHIEF COMPLAINT:  Postoperative ventilator management, sepsis management   STUDIES:  3/17  CT abdomen and pelvis >> diffuse extensive small bowel pneumatosis with mild dilation throughout the abdomen and pelvisconsistent with small bowel ischemia. Diffuse central Mesentericand portal venous small gas, Associated diffuse small bowel ileus, extensive abdominal and aortic atherosclerosis, no significant pneumoperitoneum or abscess, cholelithiasis, colonic diverticulosis  CULTURES: 3/17 BCx2 >> 3/18 sputum >> abundant klebsiella >> R - ampicillin, otherwise sensitive  3/18  UC >> neg   ANTIBIOTICS: Zosyn 3/17 >>  Vanc 3/18 >> Eraxis 3/18 >>  LINES/TUBES: R IJ TLC 3/17 >>  A Line 3/17 >>  ETT 3/17 >>    SIGNIFICANT EVENTS: 3/17 Admission, exploratory laparotomy 3/18 Complete heart block 3/18 MI - EF 45% on echo 3/19 OR for wound closure 3/20 Cath lab for temp pacemaker  3/20 -   RT reports pt returned from cath lab after temporary pacemaker insertion to holding, pending transfer to ICU.  Pt remains febrile to 101, requiring 12 mcg levophed  11/21/15 - EP reports acc jnl but has cleared patient for surgery. To OR today per CCCS notes for reanastamosis.  K given earlier today. Mag at 1.7. Curently on levophed gtt and fent gtt. Improving AKI.    SUBJECTIVE/OVERNIGHT/INTERVAL HX 11/22/15 - s/o small bowel anast and abd closure yesterday . Cards wants to diurese once bp improves. Off pressors this AM but has low grade temps/ CCS ok with heparin resumption and extubation when appropriate. No further OR trips planned.  Per RN - on fent gtt -> restless and not followng commands  VITAL SIGNS: BP 94/57 mmHg  Pulse 113  Temp(Src) 100.3 F (37.9 C) (Oral)  Resp 16  Ht 5\' 10"   (1.778 m)  Wt 79.4 kg (175 lb 0.7 oz)  BMI 25.12 kg/m2  SpO2 94%  HEMODYNAMICS: CVP:  [4 mmHg] 4 mmHg  VENTILATOR SETTINGS: Vent Mode:  [-] CPAP;PSV FiO2 (%):  [40 %-100 %] 40 % Set Rate:  [14 bmp] 14 bmp Vt Set:  [590 mL] 590 mL PEEP:  [5 cmH20] 5 cmH20 Pressure Support:  [15 cmH20] 15 cmH20 Plateau Pressure:  [15 cmH20-21 cmH20] 19 cmH20  INTAKE / OUTPUT: I/O last 3 completed shifts: In: 6810.4 [I.V.:5437.9; Other:30; NG/GT:150; IV Piggyback:1192.5] Out: [id number] [Urine:2910; Emesis/NG output:100; Drains:200]  PHYSICAL EXAMINATION: General:  Thin adult male in NAD on vent Neuro:  Sedate on vent, pupils 75mm  HEENT:  ETT, OGT in place. Cardiovascular:  s1s2 distant, regular Lungs: even/non-labored, diminished on right, coarse on left Abdomen:  Wound vac-in place c/d/i Musculoskeletal:  No acute deformities .looks "mottled" - ? Chronic discoloration from poor circulation Skin:  No rashes or lesions on anterior body. L femoral sheath c/d/i, no hematoma     LABS:  PULMONARY  Recent Labs Lab 11/17/15 1709 11/17/15 1908 11/17/15 2035 11/18/15 0415 11/19/15 0422 11/19/15 1019  PHART 7.372 7.447  --  7.410 7.387 7.311*  PCO2ART 53.9* 48.6*  --  38.4 38.0 43.2  PO2ART 308.0* 102.0*  --  131* 123.0* 81.0  HCO3 31.6* 33.5*  --  23.9 22.8 21.7  TCO2 33 35  --  25.0 24 23  O2SAT 100.0 98.0 62.5 97.3 99.0 94.0    CBC  Recent Labs Lab 11/20/15 0520 11/21/15 0417 11/22/15 0538  HGB 10.7* 10.6* 10.6*  HCT 33.1* 32.8* 31.9*  WBC 10.0 10.7* 11.3*  PLT 184 164 145*    COAGULATION  Recent Labs Lab 11/17/15 1400 11/17/15 2000 11/18/15 0440  INR 1.29 1.45 1.46    CARDIAC    Recent Labs Lab 11/18/15 1806 11/18/15 2345 11/19/15 1059 11/19/15 1635 11/19/15 2330  TROPONINI 1.23* 0.82* 0.63* 0.62* 0.56*   No results for input(s): PROBNP in the last 168 hours.   CHEMISTRY  Recent Labs Lab 11/17/15 2000 11/18/15 0440 11/19/15 0410 11/19/15 0813  11/20/15 1859 11/21/15 0417 11/22/15 0538  NA 147* 145  --  144 144 143 141  K 3.9 4.8  --  4.2 4.0 3.4* 3.9  CL 104 108  --  108 112* 111 111  CO2 24 23  --  20* 23 23 22   GLUCOSE 137* 112*  --  130* 100* 123* 127*  BUN 57* 60*  --  61* 32* 24* 14  CREATININE 6.23* 5.96*  --  4.67* 2.32* 1.88* 1.38*  CALCIUM 6.5* 5.8*  --  6.0* 6.4* 6.7* 7.0*  MG 2.3 2.1 2.2  --   --  1.7 1.7  PHOS 4.5 4.2 4.9*  --   --   --  2.0*   Estimated Creatinine Clearance: 53.6 mL/min (by C-G formula based on Cr of 1.38).   LIVER  Recent Labs Lab 11/17/15 1400 11/17/15 2000 11/18/15 0440 11/21/15 0417  AST 39 27 28 27   ALT 13* 10* 12* 17  ALKPHOS 106 69 60 137*  BILITOT 0.6 0.8 0.9 1.5*  PROT 5.6* 4.1* 4.1* 3.8*  ALBUMIN 2.8* 2.0* 1.8* 1.3*  INR 1.29 1.45 1.46  --      INFECTIOUS  Recent Labs Lab 11/18/15 0440 11/19/15 1104 11/20/15 0630  LATICACIDVEN 1.5 1.3 1.0     ENDOCRINE CBG (last 3)   Recent Labs  11/21/15 1928 11/21/15 2350 11/22/15 0402  GLUCAP 95 81 76         IMAGING x48h  - image(s) personally visualized  -   highlighted in bold Dg Chest Port 1 View  11/21/2015  CLINICAL DATA:  Hypoxia EXAM: PORTABLE CHEST 1 VIEW COMPARISON:  November 19, 2015 FINDINGS: Endotracheal tube tip is 4.1 cm above the carina. Nasogastric tube tip and side port are below the diaphragm. Central catheter tip is in the superior cava. No pneumothorax. There is now mild cardiomegaly with bilateral pleural effusions. There is atelectatic change in the bases. The pulmonary vascularity appears within normal limits. No adenopathy evident. IMPRESSION: Tube and catheter positions as described without pneumothorax. Bilateral effusions with bibasilar atelectasis. Mild cardiomegaly. Suspect a degree of underlying congestive heart failure. Electronically Signed   By: Lowella Grip III M.D.   On: 11/21/2015 08:00       DISCUSSION: 65 year old male with a PMH of ETOH, tobacco abuse and bipolar  disorder who presented to the emergency department on 3/17 with a 3 day hx of abdominal pain, nausea, vomiting and constipation. Found to have CT concerning for diffuse abdominal pneumatosis and elevated lactic acid.  Patient was taken to OR for ex-lap, found to have viable gut, diffuse pneumatosis (no exact cause) & no evidence of dead bowel. He was left open and taken to the MICU.  He developed CHB on 3/18 and MI.  Later 3/19, was taken to OR for wound closure but developed CHB in OR and VAC was applied.  Subsequently taken to cath lab 3/20 for temporary pacemaker insertion.  ASSESSMENT / PLAN:  PULMONARY A: Acute hypoxic respiratory failure  Tobacco Abuse \    - no extubation 3/22  - does not meet sbt criteria due to acute encephalopathy  P:   PRVC 8cc/kg Wean PEEP / FiO2 for sats > 92% Intermittent CXR PRN albuterol  VAP prevention   CARDIOVASCULAR A:  Septic shock- in setting of pneumoatosis, klebsiella + sputum  Complete Heart Block - s/p temporary pacemaker insertion 3/20 MI    - in sinus per cards yesterday. Off pressors. Ok per Ccs to resume heparin  P:  Start lasix 11/22/15 Cards to decide on heparin restart Dc Levophed for MAP > 65  Continue ASA ICU monitoring  Cardiology following, appreciate assistance  CVP monitoring Arterial line for blood pressure monitoring  Likely will need LHC at some point   RENAL A:   Acute kidney injury - in setting of sepsis, MI, CHB, hypotension  Hypomag and hypophos   P:   Replete mag and phos 11/22/15 Trend BMP / UOP  Foley catheter D51/2NS @ 100 ml/hr  GASTROINTESTINAL A:   Small bowel pneumatosis - viable bowel, uncertain etiology, unable to close 3/19 due to CHB, VAC applied Question upper GI bleed   - s/preanastomosis 3/21/7 and abd closure  P:   PPI QD  Postoperative management per surgery  VAC mgmt per CCS / WOC  NPO  HEMATOLOGIC A:   Anemia - suspect of critical illness + component blood loss     P:  Trend CBC  Monitor for bleeding  Heparin gtt as above   INFECTIOUS A:   Septic shock secondary to peritonitis  - off  levophed P:   ABX / Cultures as above  Trend WBC / Fever curve  CCS following   ENDOCRINE A:   Hyperglycemia   P:   SSI with CBG Q4   NEUROLOGIC A:   Post Operative Pain Sedation needs for ventilator synchrony   - gets agitated on wua  P:   RASS goal: -1 Fentanyl drip for pain  PRN versed for sedation   FAMILY  - Updates: No family at bedside 3/20.  Sister at bedside 11/21/15 - updated. None at bedside 11/22/15  - Inter-disciplinary family meet or Palliative Care meeting due by:  3/24   The patient is critically ill with multiple organ systems failure and requires high complexity decision making for assessment and support, frequent evaluation and titration of therapies, application of advanced monitoring technologies and extensive interpretation of multiple databases.   Critical Care Time devoted to patient care services described in this note is  30  Minutes. This time reflects time of care of this signee Dr Brand Males. This critical care time does not reflect procedure time, or teaching time or supervisory time of PA/NP/Med student/Med Resident etc but could involve care discussion time    Dr. Brand Males, M.D., [facility].C.P Pulmonary and Critical Care Medicine Staff Physician [place] [facility] Pager: [phone], If no answer or between  15:00h - 7:00h: call [phone]  11/22/2015 9:31 AM

## 2015-11-22 NOTE — Progress Notes (Signed)
Nutrition Follow-up  DOCUMENTATION CODES:   Non-severe (moderate) malnutrition in context of chronic illness  INTERVENTION:   - Recommend trickle feeds with Vital AF 1.2 at 10 ml/hr.  As medically appropriate, increase by 10 ml every 12 hours to a goal rate of 60 ml/hr and add 30 ml Prostat TID.  Tube feeding and Prostat regimen would provide a total of 2028 kcals, 153 grams, and 1168 ml water.   NUTRITION DIAGNOSIS:   Inadequate oral intake related to inability to eat as evidenced by NPO status.  Ongoing  GOAL:   Patient will meet greater than or equal to 90% of their needs  Unmet  MONITOR:   Vent status, Labs, Weight trends, Skin, I & O's  ASSESSMENT:   65 y/o male PMHx Substance abuse, depression/anxiety, Bipolar disorder, SBR for incarcerated femoral hernia and perforation. Presented with 3 day hx of N/V/C fever chills, abdominal pain. CT of abdomen shows extensive SB pneumatosis, mesenteric, portal venous gas. Taken emergently to OR for ex lap. No evidence of necrotic bowel, though abdomen left open at this time.   Patient currently remains intubated on ventilator support. MV: 9.3 ml/min Temp (24hrs), Avg:99.8 F (37.7 C), Min:98.5 F (36.9 C), Max:100.5 F (38.1 C) NG tube, low intermittent suction  3/19: Exploratory laparotomy open abdomen, abdominal wound vac change, small bowel resection left in discontinuity given pressure issues 3/20: Temporary pacemaker insertion 3/21: Exploratory laparotomy, small bowel anastomosis and abdominal closure  Per Surgery note 3/22: continue NGT decompression, will consider clamping soon and trickle TF.  Medications reviewed: novolog.  Labs reviewed: low phosphorus (2.0), CBGs (76-95).    Diet Order:  Diet NPO time specified  Skin:  Wound (see comment) (Stage II PU on buttocks, closed abdominal incision)  Last BM:  Unknown  Height:   Ht Readings from Last 1 Encounters:  11/17/15 5\' 10"  (1.778 m)    Weight:   Wt  Readings from Last 1 Encounters:  11/21/15 175 lb 0.7 oz (79.4 kg)    Ideal Body Weight:  75.45 kg  BMI:  Body mass index is 25.12 kg/(m^2).  Estimated Nutritional Needs:   Kcal:  1987  Protein:  140-150 grams  Fluid:  Per MD  EDUCATION NEEDS:   No education needs identified at this time  Veronda Prude, Dietetic Intern Pager: [phone]

## 2015-11-23 ENCOUNTER — Inpatient Hospital Stay (HOSPITAL_COMMUNITY): Payer: Medicare Other

## 2015-11-23 DIAGNOSIS — I4819 Other persistent atrial fibrillation: Secondary | ICD-10-CM | POA: Insufficient documentation

## 2015-11-23 DIAGNOSIS — I481 Persistent atrial fibrillation: Secondary | ICD-10-CM

## 2015-11-23 LAB — GLUCOSE, CAPILLARY
GLUCOSE-CAPILLARY: 111 mg/dL — AB (ref 65–99)
Glucose-Capillary: 102 mg/dL — ABNORMAL HIGH (ref 65–99)
Glucose-Capillary: 102 mg/dL — ABNORMAL HIGH (ref 65–99)
Glucose-Capillary: 103 mg/dL — ABNORMAL HIGH (ref 65–99)
Glucose-Capillary: 103 mg/dL — ABNORMAL HIGH (ref 65–99)
Glucose-Capillary: 92 mg/dL (ref 65–99)
Glucose-Capillary: 97 mg/dL (ref 65–99)

## 2015-11-23 LAB — HEPATIC FUNCTION PANEL
ALT: 19 U/L (ref 17–63)
AST: 29 U/L (ref 15–41)
Albumin: 1.2 g/dL — ABNORMAL LOW (ref 3.5–5.0)
Alkaline Phosphatase: 162 U/L — ABNORMAL HIGH (ref 38–126)
BILIRUBIN DIRECT: 0.7 mg/dL — AB (ref 0.1–0.5)
BILIRUBIN TOTAL: 1.6 mg/dL — AB (ref 0.3–1.2)
Indirect Bilirubin: 0.9 mg/dL (ref 0.3–0.9)
Total Protein: 3.8 g/dL — ABNORMAL LOW (ref 6.5–8.1)

## 2015-11-23 LAB — CBC
HCT: 32.1 % — ABNORMAL LOW (ref 39.0–52.0)
HCT: 26.8 % — ABNORMAL LOW (ref 39.0–52.0)
Hemoglobin: 11 g/dL — ABNORMAL LOW (ref 13.0–17.0)
Hemoglobin: 8.9 g/dL — ABNORMAL LOW (ref 13.0–17.0)
MCH: 31.3 pg (ref 26.0–34.0)
MCH: 30.3 pg (ref 26.0–34.0)
MCHC: 34.3 g/dL (ref 30.0–36.0)
MCHC: 33.2 g/dL (ref 30.0–36.0)
MCV: 91.5 fL (ref 78.0–100.0)
MCV: 91.2 fL (ref 78.0–100.0)
Platelets: 237 10*3/uL (ref 150–400)
Platelets: 182 10*3/uL (ref 150–400)
RBC: 3.51 MIL/uL — ABNORMAL LOW (ref 4.22–5.81)
RBC: 2.94 MIL/uL — ABNORMAL LOW (ref 4.22–5.81)
RDW: 14.4 % (ref 11.5–15.5)
RDW: 14.5 % (ref 11.5–15.5)
WBC: 17.1 10*3/uL — ABNORMAL HIGH (ref 4.0–10.5)
WBC: 11.1 10*3/uL — AB (ref 4.0–10.5)

## 2015-11-23 LAB — LACTIC ACID, PLASMA: Lactic Acid, Venous: 1.3 mmol/L (ref 0.5–2.0)

## 2015-11-23 LAB — BASIC METABOLIC PANEL
Anion gap: 13 (ref 5–15)
Anion gap: 9 (ref 5–15)
Anion gap: 9 (ref 5–15)
BUN: 19 mg/dL (ref 6–20)
BUN: 19 mg/dL (ref 6–20)
BUN: 21 mg/dL — AB (ref 6–20)
CALCIUM: 6.8 mg/dL — AB (ref 8.9–10.3)
CHLORIDE: 107 mmol/L (ref 101–111)
CO2: 23 mmol/L (ref 22–32)
CO2: 25 mmol/L (ref 22–32)
CO2: 24 mmol/L (ref 22–32)
CREATININE: 1.73 mg/dL — AB (ref 0.61–1.24)
CREATININE: 1.8 mg/dL — AB (ref 0.61–1.24)
Calcium: 7.1 mg/dL — ABNORMAL LOW (ref 8.9–10.3)
Calcium: 7 mg/dL — ABNORMAL LOW (ref 8.9–10.3)
Chloride: 106 mmol/L (ref 101–111)
Chloride: 110 mmol/L (ref 101–111)
Creatinine, Ser: 1.82 mg/dL — ABNORMAL HIGH (ref 0.61–1.24)
GFR calc Af Amer: 43 mL/min — ABNORMAL LOW (ref >60)
GFR calc Af Amer: 43 mL/min — ABNORMAL LOW (ref >60)
GFR calc non Af Amer: 37 mL/min — ABNORMAL LOW (ref >60)
GFR calc non Af Amer: 39 mL/min — ABNORMAL LOW (ref >60)
GFR calc non Af Amer: 37 mL/min — ABNORMAL LOW (ref >60)
GFR, EST AFRICAN AMERICAN: 45 mL/min — AB (ref >60)
GLUCOSE: 114 mg/dL — AB (ref 65–99)
Glucose, Bld: 111 mg/dL — ABNORMAL HIGH (ref 65–99)
Glucose, Bld: 104 mg/dL — ABNORMAL HIGH (ref 65–99)
POTASSIUM: 4 mmol/L (ref 3.5–5.1)
POTASSIUM: 4.3 mmol/L (ref 3.5–5.1)
Potassium: 3.7 mmol/L (ref 3.5–5.1)
SODIUM: 141 mmol/L (ref 135–145)
SODIUM: 142 mmol/L (ref 135–145)
Sodium: 143 mmol/L (ref 135–145)

## 2015-11-23 LAB — POCT I-STAT 3, ART BLOOD GAS (G3+)
Acid-Base Excess: 3 mmol/L — ABNORMAL HIGH (ref 0.0–2.0)
Bicarbonate: 27.7 mEq/L — ABNORMAL HIGH (ref 20.0–24.0)
O2 SAT: 95 %
PCO2 ART: 43.6 mmHg (ref 35.0–45.0)
PO2 ART: 79 mmHg — AB (ref 80.0–100.0)
Patient temperature: 99.5
TCO2: 29 mmol/L (ref 0–100)
pH, Arterial: 7.413 (ref 7.350–7.450)

## 2015-11-23 LAB — PROCALCITONIN: PROCALCITONIN: 3.34 ng/mL

## 2015-11-23 LAB — TROPONIN I
TROPONIN I: 0.14 ng/mL — AB (ref <0.031)
Troponin I: 0.06 ng/mL — ABNORMAL HIGH (ref <0.031)

## 2015-11-23 LAB — PHOSPHORUS: Phosphorus: 3.6 mg/dL (ref 2.5–4.6)

## 2015-11-23 LAB — MAGNESIUM
Magnesium: 1.9 mg/dL (ref 1.7–2.4)
Magnesium: 1.8 mg/dL (ref 1.7–2.4)

## 2015-11-23 LAB — PROTIME-INR
INR: 1.45 (ref 0.00–1.49)
PROTHROMBIN TIME: 17.7 s — AB (ref 11.6–15.2)

## 2015-11-23 LAB — TRIGLYCERIDES: Triglycerides: 162 mg/dL — ABNORMAL HIGH (ref <150)

## 2015-11-23 MED ORDER — AMIODARONE HCL IN DEXTROSE 360-4.14 MG/200ML-% IV SOLN
60.0000 mg/h | INTRAVENOUS | Status: AC
  Administered 2015-11-23: 30 mg/h via INTRAVENOUS
  Administered 2015-11-23: 60 mg/h via INTRAVENOUS
  Filled 2015-11-23 (×2): qty 200

## 2015-11-23 MED ORDER — AMIODARONE HCL IN DEXTROSE 360-4.14 MG/200ML-% IV SOLN: 30.0000 mg/h | INTRAVENOUS | Status: DC

## 2015-11-23 MED ORDER — NOREPINEPHRINE BITARTRATE 1 MG/ML IV SOLN
0.0000 ug/min | INTRAVENOUS | Status: DC
  Administered 2015-11-25: 8 ug/min via INTRAVENOUS
  Filled 2015-11-23 (×2): qty 16

## 2015-11-23 MED ORDER — MAGNESIUM SULFATE 2 GM/50ML IV SOLN
2.0000 g | Freq: Once | INTRAVENOUS | Status: AC
  Administered 2015-11-23: 2 g via INTRAVENOUS
  Filled 2015-11-23: qty 50

## 2015-11-23 MED ORDER — SODIUM CHLORIDE 0.9 % IV BOLUS (SEPSIS)
2000.0000 mL | Freq: Once | INTRAVENOUS | Status: AC
  Administered 2015-11-23: 1000 mL via INTRAVENOUS

## 2015-11-23 MED ORDER — DEXMEDETOMIDINE HCL IN NACL 200 MCG/50ML IV SOLN
0.4000 ug/kg/h | INTRAVENOUS | Status: DC
  Administered 2015-11-23: 0.4 ug/kg/h via INTRAVENOUS
  Filled 2015-11-23: qty 50

## 2015-11-23 MED ORDER — ETOMIDATE 2 MG/ML IV SOLN
INTRAVENOUS | Status: AC
  Administered 2015-11-23: 20 mg
  Filled 2015-11-23: qty 10

## 2015-11-23 MED ORDER — DEXTROSE 5 % IV SOLN
30.0000 ug/min | INTRAVENOUS | Status: DC
  Administered 2015-11-23: 30 ug/min via INTRAVENOUS
  Administered 2015-11-23: 60 ug/min via INTRAVENOUS
  Administered 2015-11-23: 45 ug/min via INTRAVENOUS
  Administered 2015-11-24: 10 ug/min via INTRAVENOUS
  Filled 2015-11-23 (×4): qty 1

## 2015-11-23 MED ORDER — MAGNESIUM SULFATE 2 GM/50ML IV SOLN
INTRAVENOUS | Status: AC
  Administered 2015-11-23: 2 g
  Filled 2015-11-23: qty 50

## 2015-11-23 MED ORDER — DEXMEDETOMIDINE HCL IN NACL 200 MCG/50ML IV SOLN
0.4000 ug/kg/h | INTRAVENOUS | Status: DC
  Administered 2015-11-23: 0.4 ug/kg/h via INTRAVENOUS
  Administered 2015-11-24: 0.6 ug/kg/h via INTRAVENOUS
  Administered 2015-11-24: 0.3 ug/kg/h via INTRAVENOUS
  Administered 2015-11-24 – 2015-11-25 (×2): 0.4 ug/kg/h via INTRAVENOUS
  Administered 2015-11-25: 1 ug/kg/h via INTRAVENOUS
  Administered 2015-11-25: 0.4 ug/kg/h via INTRAVENOUS
  Filled 2015-11-23 (×8): qty 50

## 2015-11-23 NOTE — Progress Notes (Signed)
Subjective: Intubated   Objective: Filed Vitals:   11/23/15 0735 11/23/15 0747 11/23/15 0800 11/23/15 0829  BP: 82/53 139/70 102/60   Pulse: 85  61 131  Temp:    101.4 F (38.6 C)  TempSrc:    Oral  Resp: 20  10 14   Height:      Weight:      SpO2: 98%  94% 93%   Weight change:   Intake/Output Summary (Last 24 hours) at 11/23/15 0934 Last data filed at 11/23/15 0735  Gross per 24 hour  Intake 4343.46 ml  Output   4350 ml  Net  -6.54 ml    General: INtubated  Sedated   Neck:  JVP is diffcult to assess Heart: Irregular rate and rhythm, without murmurs, rubs, gallops.  Lungs  Coarse rhonchi   Exemities:  No edema.   Feet cool     Tele:  Afib with RVR   Transient SR   EKG  Atrial fib/flutter  132 bpm RBBB  Lab Results: Results for orders placed or performed during the hospital encounter of 11/17/15 (from the past 24 hour(s))  Glucose, capillary     Status: None   Collection Time: 11/22/15  1:03 PM  Result Value Ref Range   Glucose-Capillary 89 65 - 99 mg/dL  Glucose, capillary     Status: Abnormal   Collection Time: 11/22/15  3:48 PM  Result Value Ref Range   Glucose-Capillary 103 (H) 65 - 99 mg/dL  Glucose, capillary     Status: Abnormal   Collection Time: 11/22/15  4:10 PM  Result Value Ref Range   Glucose-Capillary 103 (H) 65 - 99 mg/dL   Comment 1 Capillary Specimen    Comment 2 Notify RN   Glucose, capillary     Status: None   Collection Time: 11/22/15  7:34 PM  Result Value Ref Range   Glucose-Capillary 95 65 - 99 mg/dL   Comment 1 Capillary Specimen    Comment 2 Notify RN    Comment 3 Document in Chart   Glucose, capillary     Status: Abnormal   Collection Time: 11/22/15 11:40 PM  Result Value Ref Range   Glucose-Capillary 102 (H) 65 - 99 mg/dL   Comment 1 Capillary Specimen    Comment 2 Notify RN    Comment 3 Document in Chart   I-STAT 3, arterial blood gas (G3+)     Status: Abnormal   Collection Time: 11/23/15  1:43 AM  Result Value Ref Range     pH, Arterial 7.413 7.350 - 7.450   pCO2 arterial 43.6 35.0 - 45.0 mmHg   pO2, Arterial 79.0 (L) 80.0 - 100.0 mmHg   Bicarbonate 27.7 (H) 20.0 - 24.0 mEq/L   TCO2 29 0 - 100 mmol/L   O2 Saturation 95.0 %   Acid-Base Excess 3.0 (H) 0.0 - 2.0 mmol/L   Patient temperature 99.5 F    Sample type ARTERIAL   Basic metabolic panel     Status: Abnormal   Collection Time: 11/23/15  2:40 AM  Result Value Ref Range   Sodium 141 135 - 145 mmol/L   Potassium 4.0 3.5 - 5.1 mmol/L   Chloride 107 101 - 111 mmol/L   CO2 25 22 - 32 mmol/L   Glucose, Bld 111 (H) 65 - 99 mg/dL   BUN 19 6 - 20 mg/dL   Creatinine, Ser 1.82 (H) 0.61 - 1.24 mg/dL   Calcium 7.1 (L) 8.9 - 10.3 mg/dL   GFR calc non  Af Amer 37 (L) >60 mL/min   GFR calc Af Amer 43 (L) >60 mL/min   Anion gap 9 5 - 15  Magnesium     Status: None   Collection Time: 11/23/15  2:40 AM  Result Value Ref Range   Magnesium 1.9 1.7 - 2.4 mg/dL  Magnesium     Status: None   Collection Time: 11/23/15  3:19 AM  Result Value Ref Range   Magnesium 1.8 1.7 - 2.4 mg/dL  Phosphorus     Status: None   Collection Time: 11/23/15  3:19 AM  Result Value Ref Range   Phosphorus 3.6 2.5 - 4.6 mg/dL  Basic metabolic panel     Status: Abnormal   Collection Time: 11/23/15  3:19 AM  Result Value Ref Range   Sodium 142 135 - 145 mmol/L   Potassium 4.3 3.5 - 5.1 mmol/L   Chloride 106 101 - 111 mmol/L   CO2 23 22 - 32 mmol/L   Glucose, Bld 104 (H) 65 - 99 mg/dL   BUN 19 6 - 20 mg/dL   Creatinine, Ser 1.73 (H) 0.61 - 1.24 mg/dL   Calcium 7.0 (L) 8.9 - 10.3 mg/dL   GFR calc non Af Amer 39 (L) >60 mL/min   GFR calc Af Amer 45 (L) >60 mL/min   Anion gap 13 5 - 15  CBC     Status: Abnormal   Collection Time: 11/23/15  3:19 AM  Result Value Ref Range   WBC 17.1 (H) 4.0 - 10.5 K/uL   RBC 3.51 (L) 4.22 - 5.81 MIL/uL   Hemoglobin 11.0 (L) 13.0 - 17.0 g/dL   HCT 32.1 (L) 39.0 - 52.0 %   MCV 91.5 78.0 - 100.0 fL   MCH 31.3 26.0 - 34.0 pg   MCHC 34.3 30.0 - 36.0  g/dL   RDW 14.4 11.5 - 15.5 %   Platelets 237 150 - 400 K/uL  Triglycerides     Status: Abnormal   Collection Time: 11/23/15  3:20 AM  Result Value Ref Range   Triglycerides 162 (H) <150 mg/dL  Glucose, capillary     Status: Abnormal   Collection Time: 11/23/15  3:51 AM  Result Value Ref Range   Glucose-Capillary 102 (H) 65 - 99 mg/dL   Comment 1 Capillary Specimen    Comment 2 Notify RN    Comment 3 Document in Chart     Studies/Results: Dg Chest Port 1 View  11/23/2015  CLINICAL DATA:  Respiratory difficulty EXAM: PORTABLE CHEST 1 VIEW COMPARISON:  Yesterday FINDINGS: Tubular device is stable. Bibasilar opacity right renal left is stable on the right but improved on the left. This likely represents a combination of pleural fluid and underlying airspace disease. Overall vascular congestion has improved. No pneumothorax. IMPRESSION: Bilateral pleural effusions and airspace disease is stable on the right but improved on the left. Overall improved vascular congestion. Electronically Signed   By: Marybelle Killings M.D.   On: 11/23/2015 07:42   Dg Chest Port 1 View  11/22/2015  CLINICAL DATA:  Endotracheal tube present [id number] EXAM: PORTABLE CHEST - 1 VIEW COMPARISON:  the previous day's study FINDINGS: Endotracheal tube, nasogastric tube, and right IJ central line are stable in position. Probable layering pleural effusions bilaterally right greater than left. Probable adjacent atelectasis/ consolidation in the lung bases. Central pulmonary vascular congestion. Heart size upper limits normal for technique. No pneumothorax. Visualized skeletal structures are unremarkable. IMPRESSION: 1. Persistent layering pleural effusions and bibasilar atelectasis/consolidation. 2.  Support  hardware stable in position. Electronically Signed   By: Lucrezia Europe M.D.   On: 11/22/2015 10:57    Medications:Reviewed    @PROBHOSP @  1  Rhythm  Atrial fibrillation   Pt was in SR/ST  Then developed AFib with RVR  ON review  of tele he broke from this transiently to SR then back to afib  Due to this will need to have medical Rx I would recomm amiodarone IV with no bolus  Follow rhythm and rate and BP  I would not recomm anticoagulation now.    2  Acute on chronic systolic CHF  Volume status up  Diuresiss limited by BP    3  NSTEMI     LOS: 6 days   Shawn [name] 11/23/2015, 9:34 AM

## 2015-11-23 NOTE — Progress Notes (Signed)
[facility] Progress Note Patient Name: Shawn [name] DOB: 1951-02-08 MRN: [id number]   Date of Service  11/23/2015  HPI/Events of Note  Low BP, abnormal rhythm noted  eICU Interventions  Check lytes, check Mg and check CVP        Eleasha Cataldo 11/23/2015, 2:11 AM

## 2015-11-23 NOTE — Progress Notes (Signed)
PULMONARY / CRITICAL CARE MEDICINE   Name: Shawn [name] MRN: [id number] DOB: [birth year]    ADMISSION DATE:  11/17/2015 CONSULTATION DATE:  003/17/2017  REFERRING MD:  Dr. Redmond Pulling with [facility]  CHIEF COMPLAINT:  Postoperative ventilator management, sepsis management   STUDIES:  3/17  CT abdomen and pelvis >> diffuse extensive small bowel pneumatosis with mild dilation throughout the abdomen and pelvisconsistent with small bowel ischemia. Diffuse central Mesentericand portal venous small gas, Associated diffuse small bowel ileus, extensive abdominal and aortic atherosclerosis, no significant pneumoperitoneum or abscess, cholelithiasis, colonic diverticulosis  CULTURES: 3/17 BCx2 >> 3/18 sputum >> abundant klebsiella >> R - ampicillin, otherwise sensitive  3/18  UC >> neg   ANTIBIOTICS: Zosyn 3/17 >>  Vanc 3/18 >> Eraxis 3/18 >>  LINES/TUBES: R IJ TLC 3/17 >>  A Line 3/17 >>  ETT 3/17 >>    SIGNIFICANT EVENTS: 3/17 Admission, exploratory laparotomy 3/18 Complete heart block 3/18 MI - EF 45% on echo 3/19 OR for wound closure 3/20 Cath lab for temp pacemaker  3/20 -   RT reports pt returned from cath lab after temporary pacemaker insertion to holding, pending transfer to ICU.  Pt remains febrile to 101, requiring 12 mcg levophed  11/21/15 - EP reports acc jnl but has cleared patient for surgery. To OR today per CCCS notes for reanastamosis.  K given earlier today. Mag at 1.7. Curently on levophed gtt and fent gtt. Improving AKI.   11/22/15 - s/o small bowel anast and abd closure yesterday . Cards wants to diurese once bp improves. Off pressors this AM but has low grade temps/ CCS ok with heparin resumption and extubation when appropriate. No further OR trips planned.  Per RN - on fent gtt -> restless and not followng commands    SUBJECTIVE/OVERNIGHT/INTERVAL HX 11/23/15 - pacer dc'ed by cards yesterday. CCS holding off tube feds for now. Now in possible SVT HR  130. On fent gtt + levophed gtt. WUA - does not follow commands even on lower dose fent but awake and stares. Moves all 4s  VITAL SIGNS: BP 139/70 mmHg  Pulse 85  Temp(Src) 99.9 F (37.7 C) (Oral)  Resp 20  Ht 5\' 10"  (1.778 m)  Wt 80.3 kg (177 lb 0.5 oz)  BMI 25.40 kg/m2  SpO2 98%  HEMODYNAMICS: CVP:  [5 mmHg] 5 mmHg  VENTILATOR SETTINGS: Vent Mode:  [-] CPAP;PSV FiO2 (%):  [40 %] 40 % Set Rate:  [14 bmp] 14 bmp Vt Set:  [590 mL] 590 mL PEEP:  [5 cmH20] 5 cmH20 Pressure Support:  [10 cmH20-15 cmH20] 10 cmH20 Plateau Pressure:  [16 cmH20-20 cmH20] 17 cmH20  INTAKE / OUTPUT: I/O last 3 completed shifts: In: 6653.5 [I.V.:5606; NG/GT:30; IV Piggyback:1017.5] Out: 3875 [Urine:3875]  PHYSICAL EXAMINATION: General:  Thin adult male in NAD on vent Neuro:  Sedate on vent, pupils 47mm  HEENT:  ETT, OGT in place. Cardiovascular:  s1s2 distant, regular Lungs: even/non-labored, diminished on right, coarse on left Abdomen:  Wound vac-in place c/d/i Musculoskeletal:  No acute deformities .looks "mottled" - ? Chronic discoloration from poor circulation Skin:  No rashes or lesions on anterior body. L femoral sheath c/d/i, no hematoma     LABS:  PULMONARY  Recent Labs Lab 11/17/15 1908 11/17/15 2035 11/18/15 0415 11/19/15 0422 11/19/15 1019 11/23/15 0143  PHART 7.447  --  7.410 7.387 7.311* 7.413  PCO2ART 48.6*  --  38.4 38.0 43.2 43.6  PO2ART 102.0*  --  131* 123.0* 81.0 79.0*  HCO3 33.5*  --  23.9 22.8 21.7 27.7*  TCO2 35  --  25.0 24 23 29   O2SAT 98.0 62.5 97.3 99.0 94.0 95.0    CBC  Recent Labs Lab 11/21/15 0417 11/22/15 0538 11/23/15 0319  HGB 10.6* 10.6* 11.0*  HCT 32.8* 31.9* 32.1*  WBC 10.7* 11.3* 17.1*  PLT 164 145* 237    COAGULATION  Recent Labs Lab 11/17/15 1400 11/17/15 2000 11/18/15 0440  INR 1.29 1.45 1.46    CARDIAC    Recent Labs Lab 11/18/15 1806 11/18/15 2345 11/19/15 1059 11/19/15 1635 11/19/15 2330  TROPONINI 1.23* 0.82*  0.63* 0.62* 0.56*   No results for input(s): PROBNP in the last 168 hours.   CHEMISTRY  Recent Labs Lab 11/17/15 2000 11/18/15 0440 11/19/15 0410  11/20/15 1859 11/21/15 0417 11/22/15 0538 11/23/15 0240 11/23/15 0319  NA 147* 145  --   < > 144 143 141 141 142  K 3.9 4.8  --   < > 4.0 3.4* 3.9 4.0 4.3  CL 104 108  --   < > 112* 111 111 107 106  CO2 24 23  --   < > 23 23 22 25 23   GLUCOSE 137* 112*  --   < > 100* 123* 127* 111* 104*  BUN 57* 60*  --   < > 32* 24* 14 19 19   CREATININE 6.23* 5.96*  --   < > 2.32* 1.88* 1.38* 1.82* 1.73*  CALCIUM 6.5* 5.8*  --   < > 6.4* 6.7* 7.0* 7.1* 7.0*  MG 2.3 2.1 2.2  --   --  1.7 1.7 1.9 1.8  PHOS 4.5 4.2 4.9*  --   --   --  2.0*  --  3.6  < > = values in this interval not displayed. Estimated Creatinine Clearance: 42.8 mL/min (by C-G formula based on Cr of 1.73).   LIVER  Recent Labs Lab 11/17/15 1400 11/17/15 2000 11/18/15 0440 11/21/15 0417  AST 39 27 28 27   ALT 13* 10* 12* 17  ALKPHOS 106 69 60 137*  BILITOT 0.6 0.8 0.9 1.5*  PROT 5.6* 4.1* 4.1* 3.8*  ALBUMIN 2.8* 2.0* 1.8* 1.3*  INR 1.29 1.45 1.46  --      INFECTIOUS  Recent Labs Lab 11/18/15 0440 11/19/15 1104 11/20/15 0630  LATICACIDVEN 1.5 1.3 1.0     ENDOCRINE CBG (last 3)   Recent Labs  11/22/15 1934 11/22/15 2340 11/23/15 0351  GLUCAP 95 102* 102*         IMAGING x48h  - image(s) personally visualized  -   highlighted in bold Dg Chest Port 1 View  11/23/2015  CLINICAL DATA:  Respiratory difficulty EXAM: PORTABLE CHEST 1 VIEW COMPARISON:  Yesterday FINDINGS: Tubular device is stable. Bibasilar opacity right renal left is stable on the right but improved on the left. This likely represents a combination of pleural fluid and underlying airspace disease. Overall vascular congestion has improved. No pneumothorax. IMPRESSION: Bilateral pleural effusions and airspace disease is stable on the right but improved on the left. Overall improved vascular  congestion. Electronically Signed   By: Marybelle Killings M.D.   On: 11/23/2015 07:42   Dg Chest Port 1 View  11/22/2015  CLINICAL DATA:  Endotracheal tube present [id number] EXAM: PORTABLE CHEST - 1 VIEW COMPARISON:  the previous day's study FINDINGS: Endotracheal tube, nasogastric tube, and right IJ central line are stable in position. Probable layering pleural effusions bilaterally right greater than left. Probable adjacent atelectasis/ consolidation in the  lung bases. Central pulmonary vascular congestion. Heart size upper limits normal for technique. No pneumothorax. Visualized skeletal structures are unremarkable. IMPRESSION: 1. Persistent layering pleural effusions and bibasilar atelectasis/consolidation. 2.  Support hardware stable in position. Electronically Signed   By: Lucrezia Europe M.D.   On: 11/22/2015 10:57       DISCUSSION: 65 year old male with a PMH of ETOH, tobacco abuse and bipolar disorder who presented to the emergency department on 3/17 with a 3 day hx of abdominal pain, nausea, vomiting and constipation. Found to have CT concerning for diffuse abdominal pneumatosis and elevated lactic acid.  Patient was taken to OR for ex-lap, found to have viable gut, diffuse pneumatosis (no exact cause) & no evidence of dead bowel. He was left open and taken to the MICU.  He developed CHB on 3/18 and MI.  Later 3/19, was taken to OR for wound closure but developed CHB in OR and VAC was applied.  Subsequently taken to cath lab 3/20 for temporary pacemaker insertion.     ASSESSMENT / PLAN:  PULMONARY A: Acute hypoxic respiratory failure  Tobacco Abuse \    - no extubation 3/23  - does not meet sbt criteria due to acute encephalopathy  P:   PRVC 8cc/kg Wean PEEP / FiO2 for sats > 92% Intermittent CXR PRN albuterol  VAP prevention   CARDIOVASCULAR A:  Septic shock- in setting of pneumoatosis, klebsiella + sputum  Complete Heart Block - s/p temporary pacemaker insertion 3/20 MI    - in  sinus per cards yesterday and pacer dc'ed 11/22/15. On 11/23/15 - back on  Pressors and now HR 130n  P:  Get 12 lead ekg Hold  lasix 11/23/15 due to levophed need Levophed for MAP > 65  Cards to decide on heparin restart Continue ASA ICU monitoring  Cardiology following, appreciate assistance  Arterial line for blood pressure monitoring  Likely will need LHC at some point   RENAL A:   Acute kidney injury - in setting of sepsis, MI, CHB, hypotension  Hypomag  P:   Replete mag 3/232/17 Trend BMP / UOP  Foley catheter D51/2NS @ 100 ml/hr -> reduce to 50cc/h  GASTROINTESTINAL A:   Small bowel pneumatosis - viable bowel, uncertain etiology, unable to close 3/19 due to CHB, VAC applied Question upper GI bleed   - s/p re-anastomosis 3/21/7 and abd closure  P:   PPI QD  Postoperative management per surgery  VAC mgmt per CCS / WOC  NPO  HEMATOLOGIC A:   Anemia - suspect of critical illness + component blood loss    P:  Trend CBC  Monitor for bleeding    INFECTIOUS A:   Septic shock secondary to peritonitis  - back on   levophed P:   ABX / Cultures as above  Trend WBC / Fever curve  CCS following   ENDOCRINE A:   Hyperglycemia   P:   SSI with CBG Q4   NEUROLOGIC A:   Post Operative Pain Sedation needs for ventilator synchrony   - gets agitated on wua  P:   RASS goal: -1 Fentanyl drip for pain  ->t ry to wean off Start precedex gtt PRN versed for sedation   FAMILY  - Updates: No family at bedside 3/20.  Sister at bedside 11/21/15 - updated. None at bedside 11/22/15 and 11/23/15  - Inter-disciplinary family meet or Palliative Care meeting due by:  3/24  GLOBAL - dc lasix. Rx mag. HR 130 - get ekg. STart  percedex to help with agitated enceph. No extubation 11/23/15   The patient is critically ill with multiple organ systems failure and requires high complexity decision making for assessment and support, frequent evaluation and titration of therapies,  application of advanced monitoring technologies and extensive interpretation of multiple databases.   Critical Care Time devoted to patient care services described in this note is  30  Minutes. This time reflects time of care of this signee Dr Brand Males. This critical care time does not reflect procedure time, or teaching time or supervisory time of PA/NP/Med student/Med Resident etc but could involve care discussion time    Dr. Brand Males, M.D., [facility].C.P Pulmonary and Critical Care Medicine Staff Physician [facility] Pager: [phone], If no answer or between  15:00h - 7:00h: call [phone]  11/23/2015 7:48 AM

## 2015-11-23 NOTE — Significant Event (Signed)
Pt in afib/aflutter Dr. Chase Caller at bedside for cardioversion; pt shocked at 120J; pt remains in afib.  Will continue to monitor.

## 2015-11-23 NOTE — Progress Notes (Signed)
[facility] Progress Note Patient Name: Shawn [name] DOB: 05-23-1951 MRN: [id number]   Date of Service  11/23/2015  HPI/Events of Note  Patient is on an Amiodarone IV infusion. There is no order for the Amiodarone IV infusion.  eICU Interventions  Will order Amiodarone IV infusion at 0.5 mg/min.     Intervention Category Major Interventions: Arrhythmia - evaluation and management  Bali Lyn Eugene 11/23/2015, 11:40 PM

## 2015-11-23 NOTE — Progress Notes (Signed)
2 Days Post-Op  Subjective: Awake, on vent  Objective: Vital signs in last 24 hours: Temp:  [99.3 F (37.4 C)-102.5 F (39.2 C)] 99.9 F (37.7 C) (03/23 0348) Pulse Rate:  [83-117] 94 (03/23 0700) Resp:  [12-25] 14 (03/23 0700) BP: (81-126)/(38-68) 86/53 mmHg (03/23 0700) SpO2:  [92 %-99 %] 97 % (03/23 0700) Arterial Line BP: (80-171)/(33-118) 93/48 mmHg (03/23 0700) FiO2 (%):  [40 %] 40 % (03/23 0600) Weight:  [80.3 kg (177 lb 0.5 oz)] 80.3 kg (177 lb 0.5 oz) (03/23 0600)    Intake/Output from previous day: 03/22 0701 - 03/23 0700 In: 4643.5 [I.V.:3926; IV Piggyback:717.5] Out: 3150 [Urine:3150] Intake/Output this shift:   Abdomen soft, dressing intact, non distended Lab Results:   Recent Labs  11/22/15 0538 11/23/15 0319  WBC 11.3* 17.1*  HGB 10.6* 11.0*  HCT 31.9* 32.1*  PLT 145* 237   BMET  Recent Labs  11/23/15 0240 11/23/15 0319  NA 141 142  K 4.0 4.3  CL 107 106  CO2 25 23  GLUCOSE 111* 104*  BUN 19 19  CREATININE 1.82* 1.73*  CALCIUM 7.1* 7.0*   PT/INR No results for input(s): LABPROT, INR in the last 72 hours. ABG  Recent Labs  11/23/15 0143  PHART 7.413  HCO3 27.7*    Studies/Results: Dg Chest Port 1 View  11/22/2015  CLINICAL DATA:  Endotracheal tube present [id number] EXAM: PORTABLE CHEST - 1 VIEW COMPARISON:  the previous day's study FINDINGS: Endotracheal tube, nasogastric tube, and right IJ central line are stable in position. Probable layering pleural effusions bilaterally right greater than left. Probable adjacent atelectasis/ consolidation in the lung bases. Central pulmonary vascular congestion. Heart size upper limits normal for technique. No pneumothorax. Visualized skeletal structures are unremarkable. IMPRESSION: 1. Persistent layering pleural effusions and bibasilar atelectasis/consolidation. 2.  Support hardware stable in position. Electronically Signed   By: Lucrezia Europe M.D.   On: 11/22/2015 10:57     Anti-infectives: Anti-infectives    Start     Dose/Rate Route Frequency Ordered Stop   11/21/15 1400  piperacillin-tazobactam (ZOSYN) IVPB 3.375 g     3.375 g 12.5 mL/hr over 240 Minutes Intravenous 3 times per day 11/21/15 1205     11/19/15 2000  vancomycin (VANCOCIN) IVPB 1000 mg/200 mL premix     1,000 mg 200 mL/hr over 60 Minutes Intravenous Every 24 hours 11/19/15 1857     11/18/15 1800  anidulafungin (ERAXIS) 100 mg in sodium chloride 0.9 % 100 mL IVPB     100 mg over 90 Minutes Intravenous Every 24 hours 11/17/15 1812     11/17/15 2200  piperacillin-tazobactam (ZOSYN) IVPB 2.25 g  Status:  Discontinued     2.25 g 100 mL/hr over 30 Minutes Intravenous 3 times per day 11/17/15 1930 11/21/15 1205   11/17/15 1830  anidulafungin (ERAXIS) 200 mg in sodium chloride 0.9 % 200 mL IVPB     200 mg over 180 Minutes Intravenous  Once 11/17/15 1812 11/17/15 2203   11/17/15 1815  vancomycin (VANCOCIN) 1,500 mg in sodium chloride 0.9 % 500 mL IVPB     1,500 mg 250 mL/hr over 120 Minutes Intravenous  Once 11/17/15 1813 11/17/15 2103   11/17/15 1530  piperacillin-tazobactam (ZOSYN) IVPB 3.375 g     3.375 g 12.5 mL/hr over 240 Minutes Intravenous NOW 11/17/15 1521 11/17/15 1942   11/17/15 1515  piperacillin-tazobactam (ZOSYN) IVPB 4.5 g  Status:  Discontinued     4.5 g 200 mL/hr over 30 Minutes Intravenous  Once 11/17/15 1505 11/18/15 0040      Assessment/Plan: s/p Procedure(s): SMALL BOWEL REPAIR, SMALL BOWEL ANATAMOSIS AND ABDOMINAL CLOSURE (N/[name])  POD#2 Abdomen seems stable.  Will hold on starting tube feeds for 24 to 48 hours Continuing current care  LOS: 6 days    Shawn [name] 11/23/2015

## 2015-11-23 NOTE — Progress Notes (Signed)
Update  Sudden hypotension with A Fib RVR - despite fluid 1L. Called Dr Harrington Challenger  Plan  - cardioversion 150J -> but he fell back in intermittent A Fib RVR - done by her with etomidate 20mg  IV - stt mag bolus - check PCt, lactate, trop, mag, lft, cbc, irn - blood culture, tracheal aspirate - dc precedex  - change levophed to neo - 2L fluid bolus  additiojnal 30 min ccm time   Anti-infectives    Start     Dose/Rate Route Frequency Ordered Stop   11/21/15 1400  piperacillin-tazobactam (ZOSYN) IVPB 3.375 g     3.375 g 12.5 mL/hr over 240 Minutes Intravenous 3 times per day 11/21/15 1205     11/19/15 2000  vancomycin (VANCOCIN) IVPB 1000 mg/200 mL premix     1,000 mg 200 mL/hr over 60 Minutes Intravenous Every 24 hours 11/19/15 1857     11/18/15 1800  anidulafungin (ERAXIS) 100 mg in sodium chloride 0.9 % 100 mL IVPB     100 mg over 90 Minutes Intravenous Every 24 hours 11/17/15 1812     11/17/15 2200  piperacillin-tazobactam (ZOSYN) IVPB 2.25 g  Status:  Discontinued     2.25 g 100 mL/hr over 30 Minutes Intravenous 3 times per day 11/17/15 1930 11/21/15 1205   11/17/15 1830  anidulafungin (ERAXIS) 200 mg in sodium chloride 0.9 % 200 mL IVPB     200 mg over 180 Minutes Intravenous  Once 11/17/15 1812 11/17/15 2203   11/17/15 1815  vancomycin (VANCOCIN) 1,500 mg in sodium chloride 0.9 % 500 mL IVPB     1,500 mg 250 mL/hr over 120 Minutes Intravenous  Once 11/17/15 1813 11/17/15 2103   11/17/15 1530  piperacillin-tazobactam (ZOSYN) IVPB 3.375 g     3.375 g 12.5 mL/hr over 240 Minutes Intravenous NOW 11/17/15 1521 11/17/15 1942   11/17/15 1515  piperacillin-tazobactam (ZOSYN) IVPB 4.5 g  Status:  Discontinued     4.5 g 200 mL/hr over 30 Minutes Intravenous  Once 11/17/15 1505 11/18/15 0040

## 2015-11-23 NOTE — Significant Event (Signed)
Pt remains in afib with RVR; Dr. Chase Caller at bedside, [facility] gtt ordered.  Will continue to monitor

## 2015-11-23 NOTE — CV Procedure (Signed)
  Cardioversion.   Pt in afib with RVR  Hypotensive   Pt sedated with etomidate  With pads in AP position pt cardioverted with 150 joules synchronized biphasic energy to SR with frequent atrial ectopy   Procedure was without complication but pt reverted to afib with RVR within a few minutes.  Procedure without complication.

## 2015-11-23 NOTE — Care Management Important Message (Signed)
Important Message  Patient Details  Name: Shawn [name] MRN: [id number] Date of Birth: 05-25-1951   Medicare Important Message Given:  Yes    [name] 11/23/2015, 12:51 PM

## 2015-11-24 ENCOUNTER — Inpatient Hospital Stay (HOSPITAL_COMMUNITY): Payer: Medicare Other

## 2015-11-24 DIAGNOSIS — I480 Paroxysmal atrial fibrillation: Secondary | ICD-10-CM

## 2015-11-24 LAB — CBC
HEMATOCRIT: 28.1 % — AB (ref 39.0–52.0)
Hemoglobin: 9.3 g/dL — ABNORMAL LOW (ref 13.0–17.0)
MCH: 29.5 pg (ref 26.0–34.0)
MCHC: 33.1 g/dL (ref 30.0–36.0)
MCV: 89.2 fL (ref 78.0–100.0)
PLATELETS: 260 10*3/uL (ref 150–400)
RBC: 3.15 MIL/uL — ABNORMAL LOW (ref 4.22–5.81)
RDW: 14.2 % (ref 11.5–15.5)
WBC: 14.4 10*3/uL — ABNORMAL HIGH (ref 4.0–10.5)

## 2015-11-24 LAB — GLUCOSE, CAPILLARY
GLUCOSE-CAPILLARY: 107 mg/dL — AB (ref 65–99)
GLUCOSE-CAPILLARY: 116 mg/dL — AB (ref 65–99)
GLUCOSE-CAPILLARY: 94 mg/dL (ref 65–99)
GLUCOSE-CAPILLARY: 109 mg/dL — AB (ref 65–99)
Glucose-Capillary: 104 mg/dL — ABNORMAL HIGH (ref 65–99)
Glucose-Capillary: 110 mg/dL — ABNORMAL HIGH (ref 65–99)

## 2015-11-24 LAB — PHOSPHORUS: Phosphorus: 2.3 mg/dL — ABNORMAL LOW (ref 2.5–4.6)

## 2015-11-24 LAB — BASIC METABOLIC PANEL
Anion gap: 8 (ref 5–15)
BUN: 18 mg/dL (ref 6–20)
CHLORIDE: 105 mmol/L (ref 101–111)
CO2: 28 mmol/L (ref 22–32)
CREATININE: 1.54 mg/dL — AB (ref 0.61–1.24)
Calcium: 7.1 mg/dL — ABNORMAL LOW (ref 8.9–10.3)
GFR calc Af Amer: 52 mL/min — ABNORMAL LOW (ref >60)
GFR calc non Af Amer: 45 mL/min — ABNORMAL LOW (ref >60)
GLUCOSE: 129 mg/dL — AB (ref 65–99)
POTASSIUM: 3 mmol/L — AB (ref 3.5–5.1)
Sodium: 141 mmol/L (ref 135–145)

## 2015-11-24 LAB — CULTURE, BLOOD (ROUTINE X 2)
Culture: NO GROWTH
Culture: NO GROWTH

## 2015-11-24 LAB — MAGNESIUM: Magnesium: 2 mg/dL (ref 1.7–2.4)

## 2015-11-24 LAB — PROCALCITONIN: Procalcitonin: 2.04 ng/mL

## 2015-11-24 LAB — TROPONIN I: TROPONIN I: 0.06 ng/mL — AB (ref <0.031)

## 2015-11-24 LAB — TRIGLYCERIDES: TRIGLYCERIDES: 196 mg/dL — AB (ref <150)

## 2015-11-24 MED ORDER — SODIUM PHOSPHATE 3 MMOLE/ML IV SOLN
10.0000 mmol | Freq: Once | INTRAVENOUS | Status: AC
  Administered 2015-11-24: 10 mmol via INTRAVENOUS
  Filled 2015-11-24: qty 3.33

## 2015-11-24 MED ORDER — VITAL HIGH PROTEIN PO LIQD
1000.0000 mL | ORAL | Status: DC
  Administered 2015-11-24: 1000 mL

## 2015-11-24 MED ORDER — FENTANYL CITRATE (PF) 100 MCG/2ML IJ SOLN
50.0000 ug | INTRAMUSCULAR | Status: DC | PRN
  Administered 2015-11-24 – 2015-11-27 (×9): 50 ug via INTRAVENOUS
  Filled 2015-11-24 (×9): qty 2

## 2015-11-24 MED ORDER — FENTANYL CITRATE (PF) 100 MCG/2ML IJ SOLN: 25.0000 ug | INTRAMUSCULAR | Status: DC | PRN

## 2015-11-24 MED ORDER — ANTISEPTIC ORAL RINSE SOLUTION (CORINZ)
7.0000 mL | Freq: Four times a day (QID) | OROMUCOSAL | Status: DC
  Administered 2015-11-24 – 2015-11-29 (×17): 7 mL via OROMUCOSAL

## 2015-11-24 MED ORDER — CHLORHEXIDINE GLUCONATE 0.12% ORAL RINSE (MEDLINE KIT)
15.0000 mL | Freq: Two times a day (BID) | OROMUCOSAL | Status: DC
  Administered 2015-11-24 – 2015-11-29 (×9): 15 mL via OROMUCOSAL

## 2015-11-24 MED ORDER — POTASSIUM CHLORIDE 10 MEQ/50ML IV SOLN
10.0000 meq | INTRAVENOUS | Status: AC
  Administered 2015-11-24 (×4): 10 meq via INTRAVENOUS
  Filled 2015-11-24 (×4): qty 50

## 2015-11-24 MED ORDER — VITAL AF 1.2 CAL PO LIQD
1000.0000 mL | ORAL | Status: DC
  Administered 2015-11-24: 1000 mL

## 2015-11-24 MED ORDER — AMIODARONE HCL IN DEXTROSE 360-4.14 MG/200ML-% IV SOLN
30.0000 mg/h | INTRAVENOUS | Status: DC
  Administered 2015-11-24 – 2015-11-25 (×3): 30 mg/h via INTRAVENOUS
  Filled 2015-11-24 (×3): qty 200

## 2015-11-24 NOTE — Progress Notes (Signed)
Patient ID: Shawn [name], male   DOB: 25-Sep-1950, 65 y.o.   MRN: [id number]     [facility]      [address] [place], [place] [address]    Phone: [phone] FAX: [phone]     Subjective: 2 BMs yesterday.  No ng output. t max 101.4. Awake on the vent.  Cardioverted yesterday.  On amio gtt and levo and neo.   Objective:  Vital signs:  Filed Vitals:   11/24/15 0615 11/24/15 0630 11/24/15 0645 11/24/15 0700  BP: 104/63 105/63 129/68 146/71  Pulse: 59 59 60 58  Temp:      TempSrc:      Resp: '14 14 12 '$ 0  Height:      Weight:   80.6 kg (177 lb 11.1 oz)   SpO2: 100% 100% 100% 100%    Last BM Date: 11/23/15  Intake/Output   Yesterday:  03/23 0701 - 03/24 0700 In: 5222.9 [I.V.:2652.9; NG/GT:90; IV Piggyback:2480] Out: 2807 [Urine:2805; Stool:2] This shift:    Physical Exam: General: Pt awake on the vent.  Abdomen: Soft. Nondistended.+bs, abdomen is soft. Wound is open, fascia separating inferior of the wound. No evidence of peritonitis. No incarcerated hernias.  Problem List:   Principal Problem:   Septic shock ([place]) Active Problems:   Pressure ulcer   Complete heart block (HCC)   Malnutrition of moderate degree   Cardiomyopathy, ischemic   Acute respiratory failure with hypoxemia (HCC)   Acute on chronic combined systolic and diastolic CHF (congestive heart failure) (HCC)   Pneumatosis coli   Persistent atrial fibrillation (HCC)    Results:   Labs: Results for orders placed or performed during the hospital encounter of 11/17/15 (from the past 48 hour(s))  Glucose, capillary     Status: None   Collection Time: 11/22/15  8:12 AM  Result Value Ref Range   Glucose-Capillary 83 65 - 99 mg/dL   Comment 1 Arterial Specimen   Glucose, capillary     Status: None   Collection Time: 11/22/15  1:03 PM  Result Value Ref Range   Glucose-Capillary 89 65 - 99 mg/dL  Glucose, capillary     Status: Abnormal   Collection Time: 11/22/15  3:48 PM  Result Value Ref Range   Glucose-Capillary 103 (H) 65 - 99 mg/dL  Glucose, capillary     Status: Abnormal   Collection Time: 11/22/15  4:10 PM  Result Value Ref Range   Glucose-Capillary 103 (H) 65 - 99 mg/dL   Comment 1 Capillary Specimen    Comment 2 Notify RN   Glucose, capillary     Status: None   Collection Time: 11/22/15  7:34 PM  Result Value Ref Range   Glucose-Capillary 95 65 - 99 mg/dL   Comment 1 Capillary Specimen    Comment 2 Notify RN    Comment 3 Document in Chart   Glucose, capillary     Status: Abnormal   Collection Time: 11/22/15 11:40 PM  Result Value Ref Range   Glucose-Capillary 102 (H) 65 - 99 mg/dL   Comment 1 Capillary Specimen    Comment 2 Notify RN    Comment 3 Document in Chart   I-STAT 3, arterial blood gas (G3+)     Status: Abnormal   Collection Time: 11/23/15  1:43 AM  Result Value Ref Range   pH, Arterial 7.413 7.350 - 7.450   pCO2 arterial 43.6 35.0 - 45.0 mmHg   pO2, Arterial 79.0 (L) 80.0 -  100.0 mmHg   Bicarbonate 27.7 (H) 20.0 - 24.0 mEq/L   TCO2 29 0 - 100 mmol/L   O2 Saturation 95.0 %   Acid-Base Excess 3.0 (H) 0.0 - 2.0 mmol/L   Patient temperature 99.5 F    Sample type ARTERIAL   Basic metabolic panel     Status: Abnormal   Collection Time: 11/23/15  2:40 AM  Result Value Ref Range   Sodium 141 135 - 145 mmol/L   Potassium 4.0 3.5 - 5.1 mmol/L   Chloride 107 101 - 111 mmol/L   CO2 25 22 - 32 mmol/L   Glucose, Bld 111 (H) 65 - 99 mg/dL   BUN 19 6 - 20 mg/dL   Creatinine, Ser 1.53 (H) 0.61 - 1.24 mg/dL   Calcium 7.1 (L) 8.9 - 10.3 mg/dL   GFR calc non Af Amer 37 (L) >60 mL/min   GFR calc Af Amer 43 (L) >60 mL/min    Comment: (NOTE) The eGFR has been calculated using the CKD EPI equation. This calculation has not been validated in all clinical situations. eGFR's persistently <60 mL/min signify possible Chronic Kidney Disease.    Anion gap 9 5 - 15  Magnesium     Status: None   Collection  Time: 11/23/15  2:40 AM  Result Value Ref Range   Magnesium 1.9 1.7 - 2.4 mg/dL  Magnesium     Status: None   Collection Time: 11/23/15  3:19 AM  Result Value Ref Range   Magnesium 1.8 1.7 - 2.4 mg/dL  Phosphorus     Status: None   Collection Time: 11/23/15  3:19 AM  Result Value Ref Range   Phosphorus 3.6 2.5 - 4.6 mg/dL  Basic metabolic panel     Status: Abnormal   Collection Time: 11/23/15  3:19 AM  Result Value Ref Range   Sodium 142 135 - 145 mmol/L   Potassium 4.3 3.5 - 5.1 mmol/L   Chloride 106 101 - 111 mmol/L   CO2 23 22 - 32 mmol/L   Glucose, Bld 104 (H) 65 - 99 mg/dL   BUN 19 6 - 20 mg/dL   Creatinine, Ser 7.94 (H) 0.61 - 1.24 mg/dL   Calcium 7.0 (L) 8.9 - 10.3 mg/dL   GFR calc non Af Amer 39 (L) >60 mL/min   GFR calc Af Amer 45 (L) >60 mL/min    Comment: (NOTE) The eGFR has been calculated using the CKD EPI equation. This calculation has not been validated in all clinical situations. eGFR's persistently <60 mL/min signify possible Chronic Kidney Disease.    Anion gap 13 5 - 15  CBC     Status: Abnormal   Collection Time: 11/23/15  3:19 AM  Result Value Ref Range   WBC 17.1 (H) 4.0 - 10.5 K/uL   RBC 3.51 (L) 4.22 - 5.81 MIL/uL   Hemoglobin 11.0 (L) 13.0 - 17.0 g/dL   HCT 32.7 (L) 61.4 - 70.9 %   MCV 91.5 78.0 - 100.0 fL   MCH 31.3 26.0 - 34.0 pg   MCHC 34.3 30.0 - 36.0 g/dL   RDW 29.5 74.7 - 34.0 %   Platelets 237 150 - 400 K/uL  Triglycerides     Status: Abnormal   Collection Time: 11/23/15  3:20 AM  Result Value Ref Range   Triglycerides 162 (H) <150 mg/dL  Glucose, capillary     Status: Abnormal   Collection Time: 11/23/15  3:51 AM  Result Value Ref Range   Glucose-Capillary 102 (H)  65 - 99 mg/dL   Comment 1 Capillary Specimen    Comment 2 Notify RN    Comment 3 Document in Chart   Glucose, capillary     Status: None   Collection Time: 11/23/15  9:03 AM  Result Value Ref Range   Glucose-Capillary 92 65 - 99 mg/dL   Comment 1 Notify RN   Lactic  acid, plasma     Status: None   Collection Time: 11/23/15 10:31 AM  Result Value Ref Range   Lactic Acid, Venous 1.3 0.5 - 2.0 mmol/L  Basic metabolic panel     Status: Abnormal   Collection Time: 11/23/15 10:42 AM  Result Value Ref Range   Sodium 143 135 - 145 mmol/L   Potassium 3.7 3.5 - 5.1 mmol/L   Chloride 110 101 - 111 mmol/L   CO2 24 22 - 32 mmol/L   Glucose, Bld 114 (H) 65 - 99 mg/dL   BUN 21 (H) 6 - 20 mg/dL   Creatinine, Ser 1.80 (H) 0.61 - 1.24 mg/dL   Calcium 6.8 (L) 8.9 - 10.3 mg/dL   GFR calc non Af Amer 37 (L) >60 mL/min   GFR calc Af Amer 43 (L) >60 mL/min    Comment: (NOTE) The eGFR has been calculated using the CKD EPI equation. This calculation has not been validated in all clinical situations. eGFR's persistently <60 mL/min signify possible Chronic Kidney Disease.    Anion gap 9 5 - 15  Hepatic function panel     Status: Abnormal   Collection Time: 11/23/15 10:42 AM  Result Value Ref Range   Total Protein 3.8 (L) 6.5 - 8.1 g/dL   Albumin 1.2 (L) 3.5 - 5.0 g/dL   AST 29 15 - 41 U/L   ALT 19 17 - 63 U/L   Alkaline Phosphatase 162 (H) 38 - 126 U/L   Total Bilirubin 1.6 (H) 0.3 - 1.2 mg/dL   Bilirubin, Direct 0.7 (H) 0.1 - 0.5 mg/dL   Indirect Bilirubin 0.9 0.3 - 0.9 mg/dL  Protime-INR     Status: Abnormal   Collection Time: 11/23/15 10:42 AM  Result Value Ref Range   Prothrombin Time 17.7 (H) 11.6 - 15.2 seconds   INR 1.45 0.00 - 1.49  Troponin I     Status: Abnormal   Collection Time: 11/23/15 10:42 AM  Result Value Ref Range   Troponin I 0.14 (H) <0.031 ng/mL    Comment:        PERSISTENTLY INCREASED TROPONIN VALUES IN THE RANGE OF 0.04-0.49 ng/mL CAN BE SEEN IN:       -UNSTABLE ANGINA       -CONGESTIVE HEART FAILURE       -MYOCARDITIS       -CHEST TRAUMA       -ARRYHTHMIAS       -LATE PRESENTING MYOCARDIAL INFARCTION       -COPD   CLINICAL FOLLOW-UP RECOMMENDED.   CBC     Status: Abnormal   Collection Time: 11/23/15 10:45 AM  Result Value  Ref Range   WBC 11.1 (H) 4.0 - 10.5 K/uL   RBC 2.94 (L) 4.22 - 5.81 MIL/uL   Hemoglobin 8.9 (L) 13.0 - 17.0 g/dL   HCT 26.8 (L) 39.0 - 52.0 %   MCV 91.2 78.0 - 100.0 fL   MCH 30.3 26.0 - 34.0 pg   MCHC 33.2 30.0 - 36.0 g/dL   RDW 14.5 11.5 - 15.5 %   Platelets 182 150 - 400 K/uL  Procalcitonin -  Baseline     Status: None   Collection Time: 11/23/15 10:45 AM  Result Value Ref Range   Procalcitonin 3.34 ng/mL    Comment:        Interpretation: PCT > 2 ng/mL: Systemic infection (sepsis) is likely, unless other causes are known. (NOTE)         ICU PCT Algorithm               Non ICU PCT Algorithm    ----------------------------     ------------------------------         PCT < 0.25 ng/mL                 PCT < 0.1 ng/mL     Stopping of antibiotics            Stopping of antibiotics       strongly encouraged.               strongly encouraged.    ----------------------------     ------------------------------       PCT level decrease by               PCT < 0.25 ng/mL       >= 80% from peak PCT       OR PCT 0.25 - 0.5 ng/mL          Stopping of antibiotics                                             encouraged.     Stopping of antibiotics           encouraged.    ----------------------------     ------------------------------       PCT level decrease by              PCT >= 0.25 ng/mL       < 80% from peak PCT        AND PCT >= 0.5 ng/mL            Continuing antibiotics                                               encouraged.       Continuing antibiotics            encouraged.    ----------------------------     ------------------------------     PCT level increase compared          PCT > 0.5 ng/mL         with peak PCT AND          PCT >= 0.5 ng/mL             Escalation of antibiotics                                          strongly encouraged.      Escalation of antibiotics        strongly encouraged.   Glucose, capillary     Status: Abnormal   Collection Time: 11/23/15  12:56 PM  Result Value Ref Range   Glucose-Capillary 111 (H) 65 -  99 mg/dL   Comment 1 Notify RN   Glucose, capillary     Status: None   Collection Time: 11/23/15  4:28 PM  Result Value Ref Range   Glucose-Capillary 97 65 - 99 mg/dL  Troponin I     Status: Abnormal   Collection Time: 11/23/15  5:08 PM  Result Value Ref Range   Troponin I 0.06 (H) <0.031 ng/mL    Comment:        PERSISTENTLY INCREASED TROPONIN VALUES IN THE RANGE OF 0.04-0.49 ng/mL CAN BE SEEN IN:       -UNSTABLE ANGINA       -CONGESTIVE HEART FAILURE       -MYOCARDITIS       -CHEST TRAUMA       -ARRYHTHMIAS       -LATE PRESENTING MYOCARDIAL INFARCTION       -COPD   CLINICAL FOLLOW-UP RECOMMENDED.   Glucose, capillary     Status: Abnormal   Collection Time: 11/23/15  7:30 PM  Result Value Ref Range   Glucose-Capillary 103 (H) 65 - 99 mg/dL   Comment 1 Capillary Specimen    Comment 2 Notify RN    Comment 3 Document in Chart   Glucose, capillary     Status: Abnormal   Collection Time: 11/23/15 11:57 PM  Result Value Ref Range   Glucose-Capillary 110 (H) 65 - 99 mg/dL   Comment 1 Capillary Specimen    Comment 2 Notify RN    Comment 3 Document in Chart   Troponin I     Status: Abnormal   Collection Time: 11/24/15  3:20 AM  Result Value Ref Range   Troponin I 0.06 (H) <0.031 ng/mL    Comment:        PERSISTENTLY INCREASED TROPONIN VALUES IN THE RANGE OF 0.04-0.49 ng/mL CAN BE SEEN IN:       -UNSTABLE ANGINA       -CONGESTIVE HEART FAILURE       -MYOCARDITIS       -CHEST TRAUMA       -ARRYHTHMIAS       -LATE PRESENTING MYOCARDIAL INFARCTION       -COPD   CLINICAL FOLLOW-UP RECOMMENDED.   Triglycerides     Status: Abnormal   Collection Time: 11/24/15  3:20 AM  Result Value Ref Range   Triglycerides 196 (H) <150 mg/dL  Magnesium     Status: None   Collection Time: 11/24/15  3:20 AM  Result Value Ref Range   Magnesium 2.0 1.7 - 2.4 mg/dL  Phosphorus     Status: Abnormal   Collection Time:  11/24/15  3:20 AM  Result Value Ref Range   Phosphorus 2.3 (L) 2.5 - 4.6 mg/dL  Basic metabolic panel     Status: Abnormal   Collection Time: 11/24/15  3:20 AM  Result Value Ref Range   Sodium 141 135 - 145 mmol/L   Potassium 3.0 (L) 3.5 - 5.1 mmol/L    Comment: DELTA CHECK NOTED   Chloride 105 101 - 111 mmol/L   CO2 28 22 - 32 mmol/L   Glucose, Bld 129 (H) 65 - 99 mg/dL   BUN 18 6 - 20 mg/dL   Creatinine, Ser 1.54 (H) 0.61 - 1.24 mg/dL   Calcium 7.1 (L) 8.9 - 10.3 mg/dL   GFR calc non Af Amer 45 (L) >60 mL/min   GFR calc Af Amer 52 (L) >60 mL/min    Comment: (NOTE) The eGFR has been calculated using the CKD  EPI equation. This calculation has not been validated in all clinical situations. eGFR's persistently <60 mL/min signify possible Chronic Kidney Disease.    Anion gap 8 5 - 15  CBC     Status: Abnormal   Collection Time: 11/24/15  3:20 AM  Result Value Ref Range   WBC 14.4 (H) 4.0 - 10.5 K/uL   RBC 3.15 (L) 4.22 - 5.81 MIL/uL   Hemoglobin 9.3 (L) 13.0 - 17.0 g/dL   HCT 28.1 (L) 39.0 - 52.0 %   MCV 89.2 78.0 - 100.0 fL   MCH 29.5 26.0 - 34.0 pg   MCHC 33.1 30.0 - 36.0 g/dL   RDW 14.2 11.5 - 15.5 %   Platelets 260 150 - 400 K/uL  Procalcitonin     Status: None   Collection Time: 11/24/15  3:20 AM  Result Value Ref Range   Procalcitonin 2.04 ng/mL    Comment:        Interpretation: PCT > 2 ng/mL: Systemic infection (sepsis) is likely, unless other causes are known. (NOTE)         ICU PCT Algorithm               Non ICU PCT Algorithm    ----------------------------     ------------------------------         PCT < 0.25 ng/mL                 PCT < 0.1 ng/mL     Stopping of antibiotics            Stopping of antibiotics       strongly encouraged.               strongly encouraged.    ----------------------------     ------------------------------       PCT level decrease by               PCT < 0.25 ng/mL       >= 80% from peak PCT       OR PCT 0.25 - 0.5 ng/mL           Stopping of antibiotics                                             encouraged.     Stopping of antibiotics           encouraged.    ----------------------------     ------------------------------       PCT level decrease by              PCT >= 0.25 ng/mL       < 80% from peak PCT        AND PCT >= 0.5 ng/mL            Continuing antibiotics                                               encouraged.       Continuing antibiotics            encouraged.    ----------------------------     ------------------------------     PCT level increase compared          PCT > 0.5 ng/mL  with peak PCT AND          PCT >= 0.5 ng/mL             Escalation of antibiotics                                          strongly encouraged.      Escalation of antibiotics        strongly encouraged.   Glucose, capillary     Status: Abnormal   Collection Time: 11/24/15  4:02 AM  Result Value Ref Range   Glucose-Capillary 107 (H) 65 - 99 mg/dL   Comment 1 Capillary Specimen    Comment 2 Notify RN    Comment 3 Document in Chart     Imaging / Studies: Dg Chest Port 1 View  11/23/2015  CLINICAL DATA:  Respiratory difficulty EXAM: PORTABLE CHEST 1 VIEW COMPARISON:  Yesterday FINDINGS: Tubular device is stable. Bibasilar opacity right renal left is stable on the right but improved on the left. This likely represents a combination of pleural fluid and underlying airspace disease. Overall vascular congestion has improved. No pneumothorax. IMPRESSION: Bilateral pleural effusions and airspace disease is stable on the right but improved on the left. Overall improved vascular congestion. Electronically Signed   By: Marybelle Killings M.D.   On: 11/23/2015 07:42   Dg Chest Port 1 View  11/22/2015  CLINICAL DATA:  Endotracheal tube present [id number] EXAM: PORTABLE CHEST - 1 VIEW COMPARISON:  the previous day's study FINDINGS: Endotracheal tube, nasogastric tube, and right IJ central line are stable in position. Probable  layering pleural effusions bilaterally right greater than left. Probable adjacent atelectasis/ consolidation in the lung bases. Central pulmonary vascular congestion. Heart size upper limits normal for technique. No pneumothorax. Visualized skeletal structures are unremarkable. IMPRESSION: 1. Persistent layering pleural effusions and bibasilar atelectasis/consolidation. 2.  Support hardware stable in position. Electronically Signed   By: Lucrezia Europe M.D.   On: 11/22/2015 10:57    Medications / Allergies:  Scheduled Meds: . anidulafungin  100 mg Intravenous Q24H  . antiseptic oral rinse  7 mL Mouth Rinse QID  . aspirin  81 mg Per NG tube Daily  . chlorhexidine gluconate (SAGE KIT)  15 mL Mouth Rinse BID  . feeding supplement (VITAL HIGH PROTEIN)  1,000 mL Per Tube Q24H  . fentaNYL (SUBLIMAZE) injection  50 mcg Intravenous Once  . insulin aspart  0-9 Units Subcutaneous 6 times per day  . pantoprazole (PROTONIX) IV  40 mg Intravenous QHS  . piperacillin-tazobactam (ZOSYN)  IV  3.375 g Intravenous 3 times per day  . potassium chloride  10 mEq Intravenous Q1 Hr x 4  . sodium chloride flush  3 mL Intravenous Q12H  . sodium phosphate  Dextrose 5% IVPB  10 mmol Intravenous Once  . vancomycin  1,000 mg Intravenous Q24H   Continuous Infusions: . amiodarone 30 mg/hr (11/24/15 0600)  . dexmedetomidine 0.2 mcg/kg/hr (11/24/15 0600)  . dextrose 5 % and 0.45% NaCl 50 mL/hr (11/24/15 0653)  . fentaNYL infusion INTRAVENOUS Stopped (11/23/15 0845)  . norepinephrine (LEVOPHED) Adult infusion 14 mcg/min (11/24/15 0600)  . phenylephrine (NEO-SYNEPHRINE) Adult infusion 10 mcg/min (11/24/15 0600)   PRN Meds:.sodium chloride, acetaminophen, acetaminophen, albuterol, fentaNYL, Gerhardt's butt cream, midazolam, ondansetron (ZOFRAN) IV, ondansetron **OR** [DISCONTINUED] ondansetron (ZOFRAN) IV, sodium chloride flush, white petrolatum  Antibiotics: Anti-infectives    Start  Dose/Rate Route Frequency Ordered  Stop   11/21/15 1400  piperacillin-tazobactam (ZOSYN) IVPB 3.375 g     3.375 g 12.5 mL/hr over 240 Minutes Intravenous 3 times per day 11/21/15 1205     11/19/15 2000  vancomycin (VANCOCIN) IVPB 1000 mg/200 mL premix     1,000 mg 200 mL/hr over 60 Minutes Intravenous Every 24 hours 11/19/15 1857     11/18/15 1800  anidulafungin (ERAXIS) 100 mg in sodium chloride 0.9 % 100 mL IVPB     100 mg over 90 Minutes Intravenous Every 24 hours 11/17/15 1812     11/17/15 2200  piperacillin-tazobactam (ZOSYN) IVPB 2.25 g  Status:  Discontinued     2.25 g 100 mL/hr over 30 Minutes Intravenous 3 times per day 11/17/15 1930 11/21/15 1205   11/17/15 1830  anidulafungin (ERAXIS) 200 mg in sodium chloride 0.9 % 200 mL IVPB     200 mg over 180 Minutes Intravenous  Once 11/17/15 1812 11/17/15 2203   11/17/15 1815  vancomycin (VANCOCIN) 1,500 mg in sodium chloride 0.9 % 500 mL IVPB     1,500 mg 250 mL/hr over 120 Minutes Intravenous  Once 11/17/15 1813 11/17/15 2103   11/17/15 1530  piperacillin-tazobactam (ZOSYN) IVPB 3.375 g     3.375 g 12.5 mL/hr over 240 Minutes Intravenous NOW 11/17/15 1521 11/17/15 1942   11/17/15 1515  piperacillin-tazobactam (ZOSYN) IVPB 4.5 g  Status:  Discontinued     4.5 g 200 mL/hr over 30 Minutes Intravenous  Once 11/17/15 1505 11/18/15 0040        Assessment/Plan Pneumatosis  Exploratory laparotomy 11/17/15--Dr. Georgette Dover Exploratory laparotomy, SBR 11/19/15--Dr. Rosendo Gros Exploratory laparotomy, SB anastomosis and closure--Dr. Ninfa Linden  -having bowel function, start trickle TF today, surgery team to advance  -watch wound(fascia starting to separate inferior aspect of the wound) -BID wet to dry dressing changes  ID-zosyn, eraxis, watch fevers   VTE prophylaxis-SCD, may add chemical VTE prophylaxis from a surgical standpoint  PCM-start TF advance slowly   Erby Pian, [facility] Pager [phone](7A-4:30P) For consults and floor pages call  [phone](7A-4:30P)  11/24/2015 7:40 AM

## 2015-11-24 NOTE — Progress Notes (Signed)
Pharmacy Antibiotic Note  Shawn [name] is a 65 y.o. male admitted on 11/17/2015 with abdominal pain x 3 days associated with N/V fever and chills. Now with open abdomen, repeated trips to the OR. He is noted for OR for bowel anastomosis and abdominal closure. He continues on vancomycin/zosyn and Eraxis for peritonitis.  SCr= 1.54 (down from 7.47 on 3/17 admit; baseline ~ 0.7-0.8), CrCl ~ 48   Plan: -discontinue vancomycin -anidulafungin 100mg  IV q24  -Zosyn 3.375gm IV q8h -Will follow renal function, cultures and clinical progress   Height: 5\' 10"  (177.8 cm) Weight: 177 lb 11.1 oz (80.6 kg) IBW/kg (Calculated) : 73  Temp (24hrs), Avg:99.3 F (37.4 C), Min:98.8 F (37.1 C), Max:100.1 F (37.8 C)   Recent Labs Lab 11/17/15 2035 11/18/15 0440  11/19/15 1104 11/19/15 1745  11/20/15 0630  11/21/15 0417 11/22/15 0538 11/23/15 0240 11/23/15 0319 11/23/15 1031 11/23/15 1042 11/23/15 1045 11/24/15 0320  WBC  --  8.4  < >  --   --   < >  --   --  10.7* 11.3*  --  17.1*  --   --  11.1* 14.4*  CREATININE  --  5.96*  < >  --   --   --   --   < > 1.88* 1.38* 1.82* 1.73*  --  1.80*  --  1.54*  LATICACIDVEN 2.7* 1.5  --  1.3  --   --  1.0  --   --   --   --   --  1.3  --   --   --   VANCORANDOM  --   --   --   --  10  --   --   --   --   --   --   --   --   --   --   --   < > = values in this interval not displayed.  Estimated Creatinine Clearance: 48.1 mL/min (by C-G formula based on Cr of 1.54).    Allergies  Allergen Reactions  . Codeine Nausea Only and Other (See Comments)    flushing  . Depakote [Divalproex Sodium] Other (See Comments)    Makes me crazy    Antimicrobials this admission: Vanc 3/17 >>3/24 Zosyn 3/17 >> Eraxis 3/17 >>  Dose adjustments this admission: 3/19 VR = 18mcg/mL  Microbiology results: MRSA PCR neg 3/17 bld x2>>ngtd 3/19 bld x2>>ngtd 3/18 urine>>ngF 3/18 TA>>kleb pneumo (pan sensitive) 3/23 resp >. GNR >>    Thank you for allowing [place]  to participate in this patients care. Jens Som, PharmD Pager: [phone] 11/24/2015 11:43 AM

## 2015-11-24 NOTE — Progress Notes (Signed)
Subjective: Pt intubated  In NAD  Objective: Filed Vitals:   11/24/15 1215 11/24/15 1220 11/24/15 1242 11/24/15 1630  BP:   94/77   Pulse: 64  77   Temp:  99.7 F (37.6 C)  100 F (37.8 C)  TempSrc:  Axillary  Axillary  Resp: 14  20   Height:      Weight:      SpO2: 100%  100%    Weight change: 10.6 oz (0.3 kg)  Intake/Output Summary (Last 24 hours) at 11/24/15 1637 Last data filed at 11/24/15 1200  Gross per 24 hour  Intake 2871.97 ml  Output   1346 ml  Net 1525.97 ml   1/O  +18 L    General: Pt opens eyes  INtubated   Neck:  JVP is Difficult to assess Heart: Regular rate and rhythm, without murmurs, rubs, gallops.  Lungs: Bilteral  rhonchi Exemities:  Tr edema.   Neuro: Grossly intact, nonfocal.  Tele:  SR 60s   Lab Results: Results for orders placed or performed during the hospital encounter of 11/17/15 (from the past 24 hour(s))  Troponin I     Status: Abnormal   Collection Time: 11/23/15  5:08 PM  Result Value Ref Range   Troponin I 0.06 (H) <0.031 ng/mL  Glucose, capillary     Status: Abnormal   Collection Time: 11/23/15  7:30 PM  Result Value Ref Range   Glucose-Capillary 103 (H) 65 - 99 mg/dL   Comment 1 Capillary Specimen    Comment 2 Notify RN    Comment 3 Document in Chart   Glucose, capillary     Status: Abnormal   Collection Time: 11/23/15 11:57 PM  Result Value Ref Range   Glucose-Capillary 110 (H) 65 - 99 mg/dL   Comment 1 Capillary Specimen    Comment 2 Notify RN    Comment 3 Document in Chart   Troponin I     Status: Abnormal   Collection Time: 11/24/15  3:20 AM  Result Value Ref Range   Troponin I 0.06 (H) <0.031 ng/mL  Triglycerides     Status: Abnormal   Collection Time: 11/24/15  3:20 AM  Result Value Ref Range   Triglycerides 196 (H) <150 mg/dL  Magnesium     Status: None   Collection Time: 11/24/15  3:20 AM  Result Value Ref Range   Magnesium 2.0 1.7 - 2.4 mg/dL  Phosphorus     Status: Abnormal   Collection Time: 11/24/15   3:20 AM  Result Value Ref Range   Phosphorus 2.3 (L) 2.5 - 4.6 mg/dL  Basic metabolic panel     Status: Abnormal   Collection Time: 11/24/15  3:20 AM  Result Value Ref Range   Sodium 141 135 - 145 mmol/L   Potassium 3.0 (L) 3.5 - 5.1 mmol/L   Chloride 105 101 - 111 mmol/L   CO2 28 22 - 32 mmol/L   Glucose, Bld 129 (H) 65 - 99 mg/dL   BUN 18 6 - 20 mg/dL   Creatinine, Ser 1.54 (H) 0.61 - 1.24 mg/dL   Calcium 7.1 (L) 8.9 - 10.3 mg/dL   GFR calc non Af Amer 45 (L) >60 mL/min   GFR calc Af Amer 52 (L) >60 mL/min   Anion gap 8 5 - 15  CBC     Status: Abnormal   Collection Time: 11/24/15  3:20 AM  Result Value Ref Range   WBC 14.4 (H) 4.0 - 10.5 K/uL   RBC 3.15 (  L) 4.22 - 5.81 MIL/uL   Hemoglobin 9.3 (L) 13.0 - 17.0 g/dL   HCT 28.1 (L) 39.0 - 52.0 %   MCV 89.2 78.0 - 100.0 fL   MCH 29.5 26.0 - 34.0 pg   MCHC 33.1 30.0 - 36.0 g/dL   RDW 14.2 11.5 - 15.5 %   Platelets 260 150 - 400 K/uL  Procalcitonin     Status: None   Collection Time: 11/24/15  3:20 AM  Result Value Ref Range   Procalcitonin 2.04 ng/mL  Glucose, capillary     Status: Abnormal   Collection Time: 11/24/15  4:02 AM  Result Value Ref Range   Glucose-Capillary 107 (H) 65 - 99 mg/dL   Comment 1 Capillary Specimen    Comment 2 Notify RN    Comment 3 Document in Chart   Glucose, capillary     Status: Abnormal   Collection Time: 11/24/15  8:26 AM  Result Value Ref Range   Glucose-Capillary 116 (H) 65 - 99 mg/dL   Comment 1 Capillary Specimen    Comment 2 Notify RN   Glucose, capillary     Status: None   Collection Time: 11/24/15 12:19 PM  Result Value Ref Range   Glucose-Capillary 94 65 - 99 mg/dL   Comment 1 Capillary Specimen    Comment 2 Notify RN     Studies/Results: Dg Chest Port 1 View  11/24/2015  CLINICAL DATA:  Check endotracheal tube placement EXAM: PORTABLE CHEST 1 VIEW COMPARISON:  11/23/2015 FINDINGS: Cardiac shadow is stable. An endotracheal tube, nasogastric catheter and right jugular central  line are again seen and stable. Bilateral pleural effusions right greater than left are noted. Bibasilar infiltrates are again seen right greater than left. No new focal abnormality is noted. IMPRESSION: Stable bibasilar changes worse on the right than the left. Electronically Signed   By: Inez Catalina M.D.   On: 11/24/2015 07:48    Medications: REviewed    @PROBHOSP @  1  Rhythm  Pt converted to SR  On IV amiodarone now  WOuld convert to PO since getting tube feeds  400 bid  Watch HR  Tolerating so far.  No significant bradycardia.    2  Acute on chronic systolic CHF  Pt remains volume overloaded  Limited because of need for pressors  Would get coox   LOS: 7 days   Dorris Carnes 11/24/2015, 4:37 PM

## 2015-11-24 NOTE — Progress Notes (Signed)
Nutrition Follow-up  DOCUMENTATION CODES:   Non-severe (moderate) malnutrition in context of chronic illness  INTERVENTION:   As pt tolerates TF recommend increase Vital AF 1.2 by 10 ml every 12 hours to goal: Vital AF 1.2 @ 60 ml/hr 30 ml Prostat TID Provides: 2028 kcal, 153 grams protein, and 1168 ml H2O.   NUTRITION DIAGNOSIS:   Inadequate oral intake related to inability to eat as evidenced by NPO status. Ongoing.   GOAL:   Patient will meet greater than or equal to 90% of their needs Progressing.   MONITOR:   Vent status, Labs, Weight trends, Skin, I & O's  ASSESSMENT:   65 y/o male PMHx Substance abuse, depression/anxiety, Bipolar disorder, SBR for incarcerated femoral hernia and perforation. Presented with 3 day hx of N/V/C fever chills, abdominal pain. CT of abdomen shows extensive SB pneumatosis, mesenteric, portal venous gas. Taken emergently to OR for ex lap. No evidence of necrotic bowel, though abdomen left open at this time.  3/19: Exploratory laparotomy open abdomen, abdominal wound vac change, small bowel resection left in discontinuity given pressure issues 3/20: Temporary pacemaker insertion 3/21: Exploratory laparotomy, small bowel anastomosis and abdominal closure 3/24: TF started with plans to advance slowly  Patient is currently intubated on ventilator support MV: 8.4 L/min Temp (24hrs), Avg:99.3 F (37.4 C), Min:98.8 F (37.1 C), Max:100.1 F (37.8 C)  Labs reviewed: potassium low 3.0, phosphorus low 2.3 CBG's: 107-116 NG tube in place  Diet Order:  Diet NPO time specified  Skin:  Wound (see comment) (Stage II buttokcs, closed abdominal incision)  Last BM:  3/23  Height:   Ht Readings from Last 1 Encounters:  11/17/15 5\' 10"  (1.778 m)    Weight:   Wt Readings from Last 1 Encounters:  11/24/15 177 lb 11.1 oz (80.6 kg)    Ideal Body Weight:  75.45 kg  BMI:  Body mass index is 25.5 kg/(m^2).  Estimated Nutritional Needs:    Kcal:  1987  Protein:  140-150 grams  Fluid:  Per MD  EDUCATION NEEDS:   No education needs identified at this time  Bell, [place], [place] Pager [phone] After Hours Pager

## 2015-11-24 NOTE — Progress Notes (Signed)
PULMONARY / CRITICAL CARE MEDICINE   Name: Shawn MESECHER MRN: [id number] DOB: [birth year]    ADMISSION DATE:  11/17/2015 CONSULTATION DATE:  003/17/2017  REFERRING MD:  Dr. Redmond Pulling with [facility]  CHIEF COMPLAINT:  Postoperative ventilator management, sepsis management  65 year old male with a PMH of ETOH, tobacco abuse and bipolar disorder who presented to the emergency department on 3/17 with a 3 day hx of abdominal pain, nausea, vomiting and constipation. Found to have CT concerning for diffuse abdominal pneumatosis and elevated lactic acid.  Patient was taken to OR for ex-lap, found to have viable gut, diffuse pneumatosis (no exact cause) & no evidence of dead bowel. He was left open and taken to the MICU.  He developed CHB on 3/18 and MI.  Later 3/19, was taken to OR for wound closure but developed CHB in OR and VAC was applied.  Subsequently taken to cath lab 3/20 for temporary pacemaker insertion.    STUDIES:  3/17  CT abdomen and pelvis >> diffuse extensive small bowel pneumatosis with mild dilation throughout the abdomen and pelvisconsistent with small bowel ischemia. Diffuse central Mesentericand portal venous small gas, Associated diffuse small bowel ileus, extensive abdominal and aortic atherosclerosis, no significant pneumoperitoneum or abscess, cholelithiasis, colonic diverticulosis  CULTURES: 3/17 BCx2 >>ng 3/18 sputum >> abundant klebsiella >> R - ampicillin, otherwise sensitive  3/18  UC >> neg  3/19 BC >> 3/23 resp >. GNR >>  ANTIBIOTICS: Zosyn 3/17 >>  Vanc 3/18 >> Eraxis 3/18 >>  LINES/TUBES: R IJ TLC 3/17 >>  A Line 3/17 >>  ETT 3/17 >>    SIGNIFICANT EVENTS: 3/17 Admission, exploratory laparotomy 3/18 Complete heart block 3/18 MI - EF 45% on echo 3/19 OR for wound closure 3/20 Cath lab for temp pacemaker  3/20 -   RT reports pt returned from cath lab after temporary pacemaker insertion to holding, pending transfer to ICU.  Pt remains  febrile to 101, requiring 12 mcg levophed  11/21/15 - EP reports acc jnl but has cleared patient for surgery. To OR today per CCCS notes for reanastamosis.  K given earlier today. Mag at 1.7. Curently on levophed gtt and fent gtt. Improving AKI.   11/22/15 - s/o small bowel anast and abd closure yesterday . Cards wants to diurese once bp improves. Off pressors this AM but has low grade temps/ CCS ok with heparin resumption and extubation when appropriate. No further OR trips planned.  Per RN - on fent gtt -> restless and not followng commands  11/23/15 - pacer dc'ed by cards yesterday. CCS holding off tube feds for now.  possible SVT HR 130.   SUBJECTIVE/OVERNIGHT/INTERVAL HX On low dose levo + neo gtt Awake, no obvious pain Good UO  VITAL SIGNS: BP 103/63 mmHg  Pulse 62  Temp(Src) 98.8 F (37.1 C) (Axillary)  Resp 13  Ht 5\' 10"  (1.778 m)  Wt 177 lb 11.1 oz (80.6 kg)  BMI 25.50 kg/m2  SpO2 100%  HEMODYNAMICS:    VENTILATOR SETTINGS: Vent Mode:  [-] PRVC FiO2 (%):  [40 %] 40 % Set Rate:  [14 bmp] 14 bmp Vt Set:  [590 mL] 590 mL PEEP:  [5 cmH20] 5 cmH20 Plateau Pressure:  [14 cmH20-17 cmH20] 16 cmH20  INTAKE / OUTPUT: I/O last 3 completed shifts: In: [id number] [I.V.:4735.5; NG/GT:90; IV Piggyback:3017.5] Out: [id number] [Urine:4180; Stool:2]  PHYSICAL EXAMINATION: General:  Thin adult male in NAD on vent Neuro:  awake on vent, pupils 17mm  HEENT:  ETT, OGT  in place. Cardiovascular:  s1s2 distant, regular Lungs: even/non-labored, diminished on right, coarse on left Abdomen:  Wound vac-in place c/d/i Musculoskeletal:   Chronic discoloration BLEs from poor circulation Skin:  No rashes or lesions on anterior body. L femoral sheath c/d/i, no hematoma     LABS:  PULMONARY  Recent Labs Lab 11/17/15 1908 11/17/15 2035 11/18/15 0415 11/19/15 0422 11/19/15 1019 11/23/15 0143  PHART 7.447  --  7.410 7.387 7.311* 7.413  PCO2ART 48.6*  --  38.4 38.0 43.2 43.6  PO2ART 102.0*  --   131* 123.0* 81.0 79.0*  HCO3 33.5*  --  23.9 22.8 21.7 27.7*  TCO2 35  --  25.0 24 23 29   O2SAT 98.0 62.5 97.3 99.0 94.0 95.0    CBC  Recent Labs Lab 11/23/15 0319 11/23/15 1045 11/24/15 0320  HGB 11.0* 8.9* 9.3*  HCT 32.1* 26.8* 28.1*  WBC 17.1* 11.1* 14.4*  PLT 237 182 260    COAGULATION  Recent Labs Lab 11/17/15 1400 11/17/15 2000 11/18/15 0440 11/23/15 1042  INR 1.29 1.45 1.46 1.45    CARDIAC    Recent Labs Lab 11/19/15 1635 11/19/15 2330 11/23/15 1042 11/23/15 1708 11/24/15 0320  TROPONINI 0.62* 0.56* 0.14* 0.06* 0.06*   No results for input(s): PROBNP in the last 168 hours.   CHEMISTRY  Recent Labs Lab 11/18/15 0440 11/19/15 0410  11/21/15 0417 11/22/15 0538 11/23/15 0240 11/23/15 0319 11/23/15 1042 11/24/15 0320  NA 145  --   < > 143 141 141 142 143 141  K 4.8  --   < > 3.4* 3.9 4.0 4.3 3.7 3.0*  CL 108  --   < > 111 111 107 106 110 105  CO2 23  --   < > 23 22 25 23 24 28   GLUCOSE 112*  --   < > 123* 127* 111* 104* 114* 129*  BUN 60*  --   < > 24* 14 19 19  21* 18  CREATININE 5.96*  --   < > 1.88* 1.38* 1.82* 1.73* 1.80* 1.54*  CALCIUM 5.8*  --   < > 6.7* 7.0* 7.1* 7.0* 6.8* 7.1*  MG 2.1 2.2  --  1.7 1.7 1.9 1.8  --  2.0  PHOS 4.2 4.9*  --   --  2.0*  --  3.6  --  2.3*  < > = values in this interval not displayed. Estimated Creatinine Clearance: 48.1 mL/min (by C-G formula based on Cr of 1.54).   LIVER  Recent Labs Lab 11/17/15 1400 11/17/15 2000 11/18/15 0440 11/21/15 0417 11/23/15 1042  AST 39 27 28 27 29   ALT 13* 10* 12* 17 19  ALKPHOS 106 69 60 137* 162*  BILITOT 0.6 0.8 0.9 1.5* 1.6*  PROT 5.6* 4.1* 4.1* 3.8* 3.8*  ALBUMIN 2.8* 2.0* 1.8* 1.3* 1.2*  INR 1.29 1.45 1.46  --  1.45     INFECTIOUS  Recent Labs Lab 11/19/15 1104 11/20/15 0630 11/23/15 1031 11/23/15 1045 11/24/15 0320  LATICACIDVEN 1.3 1.0 1.3  --   --   PROCALCITON  --   --   --  3.34 2.04     ENDOCRINE CBG (last 3)   Recent Labs   11/23/15 2357 11/24/15 0402 11/24/15 0826  GLUCAP 110* 107* 116*         IMAGING x48h  - image(s) personally visualized  -   highlighted in bold Dg Chest Port 1 View  11/24/2015  CLINICAL DATA:  Check endotracheal tube placement EXAM: PORTABLE CHEST 1  VIEW COMPARISON:  11/23/2015 FINDINGS: Cardiac shadow is stable. An endotracheal tube, nasogastric catheter and right jugular central line are again seen and stable. Bilateral pleural effusions right greater than left are noted. Bibasilar infiltrates are again seen right greater than left. No new focal abnormality is noted. IMPRESSION: Stable bibasilar changes worse on the right than the left. Electronically Signed   By: Inez Catalina M.D.   On: 11/24/2015 07:48   Dg Chest Port 1 View  11/23/2015  CLINICAL DATA:  Respiratory difficulty EXAM: PORTABLE CHEST 1 VIEW COMPARISON:  Yesterday FINDINGS: Tubular device is stable. Bibasilar opacity right renal left is stable on the right but improved on the left. This likely represents a combination of pleural fluid and underlying airspace disease. Overall vascular congestion has improved. No pneumothorax. IMPRESSION: Bilateral pleural effusions and airspace disease is stable on the right but improved on the left. Overall improved vascular congestion. Electronically Signed   By: Marybelle Killings M.D.   On: 11/23/2015 07:42       DISCUSSION:    ASSESSMENT / PLAN:  PULMONARY A: Acute hypoxic respiratory failure  Tobacco Abuse , likely COPD   P:   PRVC 8cc/kg Start SBTs PRN albuterol  VAP prevention   CARDIOVASCULAR A:  Septic shock- in setting of pneumoatosis, klebsiella + sputum  Complete Heart Block - s/p temporary pacemaker insertion 3/20 MI    - in sinus per cards 3/22 and pacer dc'ed 11/22/15. On 11/23/15 - back on  Pressors and now HR 130n >> failed cardioversion  P:  Levophed for MAP > 65, taper neo to off   Cards to decide on heparin restart Continue ASA Arterial line reads  higher than cuff Likely will need LHC at some point   RENAL A:   Acute kidney injury - in setting of sepsis, MI, CHB, hypotension  Hypomag hypokalemia  P:   Replete lytes as needed Trend BMP / UOP  Foley catheter   GASTROINTESTINAL A:   Small bowel pneumatosis - viable bowel, uncertain etiology, unable to close 3/19 due to CHB, VAC applied Question upper GI bleed   - s/p re-anastomosis 3/21/7 and abd closure  P:   PPI QD  Postoperative management per surgery  VAC mgmt per CCS / WOC  NPO Low dose TFs being titrated  HEMATOLOGIC A:   Anemia - suspect of critical illness + component blood loss    P:  Trend CBC  Monitor for bleeding    INFECTIOUS A:   Septic shock secondary to peritonitis GNR- HCAP P:   ABX / Cultures as above , dc vanc Trend WBC / Fever curve  CCS following   ENDOCRINE A:   Hyperglycemia   P:   SSI with CBG Q4   NEUROLOGIC A:   Post Operative Pain  P:   RASS goal: -1 Fentanyl drip for pain  ->t ry to wean off  precedex gtt PRN versed for sedation   FAMILY  - Updates:  Sister at bedside 11/21/15 - updated. None at bedside 11/22/15 and 11/23/15  - Inter-disciplinary family meet or Palliative Care meeting due by:  3/24  GLOBAL Resolving sepsis, tachyrrhythmias now controlled Start wean , hopeful for extuabton over weekend   The patient is critically ill with multiple organ systems failure and requires high complexity decision making for assessment and support, frequent evaluation and titration of therapies, application of advanced monitoring technologies and extensive interpretation of multiple databases. Critical Care Time devoted to patient care services described in this note  independent of APP time is 35 minutes.    Kara Mead MD. Shade Flood. [facility] Pager [phone] If no response call [phone]   11/24/2015    11/24/2015 10:39 AM

## 2015-11-25 DIAGNOSIS — J9600 Acute respiratory failure, unspecified whether with hypoxia or hypercapnia: Secondary | ICD-10-CM | POA: Insufficient documentation

## 2015-11-25 LAB — BASIC METABOLIC PANEL
Anion gap: 8 (ref 5–15)
BUN: 15 mg/dL (ref 6–20)
CHLORIDE: 107 mmol/L (ref 101–111)
CO2: 24 mmol/L (ref 22–32)
Calcium: 7.2 mg/dL — ABNORMAL LOW (ref 8.9–10.3)
Creatinine, Ser: 1.15 mg/dL (ref 0.61–1.24)
GFR calc Af Amer: >60 mL/min (ref >60)
GFR calc non Af Amer: >60 mL/min (ref >60)
GLUCOSE: 128 mg/dL — AB (ref 65–99)
POTASSIUM: 3.3 mmol/L — AB (ref 3.5–5.1)
Sodium: 139 mmol/L (ref 135–145)

## 2015-11-25 LAB — POCT I-STAT 3, ART BLOOD GAS (G3+)
ACID-BASE EXCESS: 3 mmol/L — AB (ref 0.0–2.0)
BICARBONATE: 26.1 meq/L — AB (ref 20.0–24.0)
O2 Saturation: 97 %
PH ART: 7.478 — AB (ref 7.350–7.450)
TCO2: 27 mmol/L (ref 0–100)
pCO2 arterial: 35.2 mmHg (ref 35.0–45.0)
pO2, Arterial: 88 mmHg (ref 80.0–100.0)

## 2015-11-25 LAB — CBC
HEMATOCRIT: 27.6 % — AB (ref 39.0–52.0)
Hemoglobin: 9.3 g/dL — ABNORMAL LOW (ref 13.0–17.0)
MCH: 30.1 pg (ref 26.0–34.0)
MCHC: 33.7 g/dL (ref 30.0–36.0)
MCV: 89.3 fL (ref 78.0–100.0)
Platelets: 302 10*3/uL (ref 150–400)
RBC: 3.09 MIL/uL — ABNORMAL LOW (ref 4.22–5.81)
RDW: 14.2 % (ref 11.5–15.5)
WBC: 12 10*3/uL — ABNORMAL HIGH (ref 4.0–10.5)

## 2015-11-25 LAB — CULTURE, RESPIRATORY: SPECIAL REQUESTS: NORMAL

## 2015-11-25 LAB — GLUCOSE, CAPILLARY
GLUCOSE-CAPILLARY: 103 mg/dL — AB (ref 65–99)
GLUCOSE-CAPILLARY: 103 mg/dL — AB (ref 65–99)
Glucose-Capillary: 103 mg/dL — ABNORMAL HIGH (ref 65–99)
Glucose-Capillary: 113 mg/dL — ABNORMAL HIGH (ref 65–99)
Glucose-Capillary: 126 mg/dL — ABNORMAL HIGH (ref 65–99)
Glucose-Capillary: 89 mg/dL (ref 65–99)

## 2015-11-25 LAB — CULTURE, RESPIRATORY W GRAM STAIN

## 2015-11-25 LAB — PHOSPHORUS: Phosphorus: 2.6 mg/dL (ref 2.5–4.6)

## 2015-11-25 LAB — PROCALCITONIN: Procalcitonin: 0.88 ng/mL

## 2015-11-25 LAB — CORTISOL: CORTISOL PLASMA: 17.8 ug/dL

## 2015-11-25 LAB — MAGNESIUM: Magnesium: 1.9 mg/dL (ref 1.7–2.4)

## 2015-11-25 MED ORDER — VITAL AF 1.2 CAL PO LIQD
1000.0000 mL | ORAL | Status: DC
  Administered 2015-11-25: 1000 mL

## 2015-11-25 MED ORDER — FUROSEMIDE 10 MG/ML IJ SOLN
40.0000 mg | Freq: Two times a day (BID) | INTRAMUSCULAR | Status: DC
  Administered 2015-11-25 – 2015-11-26 (×3): 40 mg via INTRAVENOUS
  Filled 2015-11-25 (×3): qty 4

## 2015-11-25 MED ORDER — SODIUM CHLORIDE 0.9% FLUSH
10.0000 mL | Freq: Two times a day (BID) | INTRAVENOUS | Status: DC
  Administered 2015-11-25 – 2015-11-27 (×2): 10 mL

## 2015-11-25 MED ORDER — POTASSIUM CHLORIDE 10 MEQ/50ML IV SOLN
10.0000 meq | INTRAVENOUS | Status: AC
  Administered 2015-11-25 (×2): 10 meq via INTRAVENOUS
  Filled 2015-11-25: qty 50

## 2015-11-25 MED ORDER — SODIUM CHLORIDE 0.9% FLUSH: 10.0000 mL | INTRAVENOUS | Status: DC | PRN

## 2015-11-25 MED ORDER — HYDROCORTISONE NA SUCCINATE PF 100 MG IJ SOLR
50.0000 mg | Freq: Four times a day (QID) | INTRAMUSCULAR | Status: DC
  Administered 2015-11-25 – 2015-11-27 (×8): 50 mg via INTRAVENOUS
  Filled 2015-11-25 (×8): qty 2

## 2015-11-25 MED ORDER — HEPARIN SODIUM (PORCINE) 5000 UNIT/ML IJ SOLN
5000.0000 [IU] | Freq: Three times a day (TID) | INTRAMUSCULAR | Status: DC
  Administered 2015-11-25 – 2015-11-29 (×12): 5000 [IU] via SUBCUTANEOUS
  Filled 2015-11-25 (×12): qty 1

## 2015-11-25 NOTE — Progress Notes (Signed)
PULMONARY / CRITICAL CARE MEDICINE   Name: Shawn [name] MRN: [id number] DOB: Jan 08, 1951    ADMISSION DATE:  11/17/2015 CONSULTATION DATE:  003/17/2017  REFERRING MD:  Dr. Redmond Pulling with [facility]  CHIEF COMPLAINT:  Postoperative ventilator management, sepsis management  65 year old male with a PMH of ETOH, tobacco abuse and bipolar disorder who presented to the emergency department on 3/17 with a 3 day hx of abdominal pain, nausea, vomiting and constipation. Found to have CT concerning for diffuse abdominal pneumatosis and elevated lactic acid.  Patient was taken to OR for ex-lap, found to have viable gut, diffuse pneumatosis (no exact cause) & no evidence of dead bowel. He was left open and taken to the MICU.  He developed CHB on 3/18 and MI.  Later 3/19, was taken to OR for wound closure but developed CHB in OR and VAC was applied.  Subsequently taken to cath lab 3/20 for temporary pacemaker insertion.    STUDIES:  3/17  CT abdomen and pelvis >> diffuse extensive small bowel pneumatosis with mild dilation throughout the abdomen and pelvisconsistent with small bowel ischemia. Diffuse central Mesentericand portal venous small gas, Associated diffuse small bowel ileus, extensive abdominal and aortic atherosclerosis, no significant pneumoperitoneum or abscess, cholelithiasis, colonic diverticulosis  CULTURES: 3/17 BCx2 >>ng 3/18 sputum >> abundant klebsiella >> R - ampicillin, otherwise sensitive  3/18  UC >> neg  3/19 BC >> 3/23 resp >. GNR >>  ANTIBIOTICS: Zosyn 3/17 >>  Vanc 3/18 >>off Eraxis 3/18 >>3/27  LINES/TUBES: R IJ TLC 3/17 >>  A Line 3/17 >>  ETT 3/17 >>    SIGNIFICANT EVENTS: 3/17 Admission, exploratory laparotomy 3/18 Complete heart block 3/18 MI - EF 45% on echo 3/19 OR for wound closure 3/20 Cath lab for temp pacemaker  3/20 -   RT reports pt returned from cath lab after temporary pacemaker insertion to holding, pending transfer to ICU.  Pt remains  febrile to 101, requiring 12 mcg levophed  11/21/15 - EP reports acc jnl but has cleared patient for surgery. To OR today per CCCS notes for reanastamosis.  K given earlier today. Mag at 1.7. Curently on levophed gtt and fent gtt. Improving AKI.   11/22/15 - s/o small bowel anast and abd closure yesterday . Cards wants to diurese once bp improves. Off pressors this AM but has low grade temps/ CCS ok with heparin resumption and extubation when appropriate. No further OR trips planned.  Per RN - on fent gtt -> restless and not followng commands  11/23/15 - pacer dc'ed by cards yesterday. CCS holding off tube feds for now.  possible SVT HR 130.   SUBJECTIVE/OVERNIGHT/INTERVAL HX Levo at 8, on vent  VITAL SIGNS: BP 109/69 mmHg  Pulse 68  Temp(Src) 98.9 F (37.2 C) (Axillary)  Resp 14  Ht 5\' 10"  (1.778 m)  Wt 83.4 kg (183 lb 13.8 oz)  BMI 26.38 kg/m2  SpO2 100%  HEMODYNAMICS:    VENTILATOR SETTINGS: Vent Mode:  [-] PRVC FiO2 (%):  [40 %] 40 % Set Rate:  [14 bmp] 14 bmp Vt Set:  [590 mL] 590 mL PEEP:  [5 cmH20] 5 cmH20 Pressure Support:  [10 cmH20] 10 cmH20 Plateau Pressure:  [14 cmH20-21 cmH20] 15 cmH20  INTAKE / OUTPUT: I/O last 3 completed shifts: In: 4578.9 [I.V.:3207.6; NG/GT:291.3; IV Piggyback:1080] Out: [id number] [Urine:1735]  PHYSICAL EXAMINATION: General:  Thin adult male in NAD on vent Neuro:  awake on vent HEENT:  ETT, OGT in place. Cardiovascular:  s1s2  distant, regular Lungs: coarse Abdomen:  Wound vac-in place c/d/i Musculoskeletal:   Chronic discoloration BLEs from poor circulation Skin:  No rashes or lesions on anterior body. L femoral sheath c/d/i, no hematoma     LABS:  PULMONARY  Recent Labs Lab 11/19/15 0422 11/19/15 1019 11/23/15 0143  PHART 7.387 7.311* 7.413  PCO2ART 38.0 43.2 43.6  PO2ART 123.0* 81.0 79.0*  HCO3 22.8 21.7 27.7*  TCO2 24 23 29   O2SAT 99.0 94.0 95.0    CBC  Recent Labs Lab 11/23/15 1045 11/24/15 0320 11/25/15 0430   HGB 8.9* 9.3* 9.3*  HCT 26.8* 28.1* 27.6*  WBC 11.1* 14.4* 12.0*  PLT 182 260 302    COAGULATION  Recent Labs Lab 11/23/15 1042  INR 1.45    CARDIAC    Recent Labs Lab 11/19/15 1635 11/19/15 2330 11/23/15 1042 11/23/15 1708 11/24/15 0320  TROPONINI 0.62* 0.56* 0.14* 0.06* 0.06*   No results for input(s): PROBNP in the last 168 hours.   CHEMISTRY  Recent Labs Lab 11/19/15 0410  11/22/15 0538 11/23/15 0240 11/23/15 0319 11/23/15 1042 11/24/15 0320 11/25/15 0430  NA  --   < > 141 141 142 143 141 139  K  --   < > 3.9 4.0 4.3 3.7 3.0* 3.3*  CL  --   < > 111 107 106 110 105 107  CO2  --   < > 22 25 23 24 28 24   GLUCOSE  --   < > 127* 111* 104* 114* 129* 128*  BUN  --   < > 14 19 19  21* 18 15  CREATININE  --   < > 1.38* 1.82* 1.73* 1.80* 1.54* 1.15  CALCIUM  --   < > 7.0* 7.1* 7.0* 6.8* 7.1* 7.2*  MG 2.2  < > 1.7 1.9 1.8  --  2.0 1.9  PHOS 4.9*  --  2.0*  --  3.6  --  2.3* 2.6  < > = values in this interval not displayed. Estimated Creatinine Clearance: 64.4 mL/min (by C-G formula based on Cr of 1.15).   LIVER  Recent Labs Lab 11/21/15 0417 11/23/15 1042  AST 27 29  ALT 17 19  ALKPHOS 137* 162*  BILITOT 1.5* 1.6*  PROT 3.8* 3.8*  ALBUMIN 1.3* 1.2*  INR  --  1.45     INFECTIOUS  Recent Labs Lab 11/19/15 1104 11/20/15 0630 11/23/15 1031 11/23/15 1045 11/24/15 0320 11/25/15 0430  LATICACIDVEN 1.3 1.0 1.3  --   --   --   PROCALCITON  --   --   --  3.34 2.04 0.88     ENDOCRINE CBG (last 3)   Recent Labs  11/25/15 0010 11/25/15 0422 11/25/15 0830  GLUCAP 113* 89 103*         IMAGING x48h  - image(s) personally visualized  -   highlighted in bold Dg Chest Port 1 View  11/24/2015  CLINICAL DATA:  Check endotracheal tube placement EXAM: PORTABLE CHEST 1 VIEW COMPARISON:  11/23/2015 FINDINGS: Cardiac shadow is stable. An endotracheal tube, nasogastric catheter and right jugular central line are again seen and stable. Bilateral  pleural effusions right greater than left are noted. Bibasilar infiltrates are again seen right greater than left. No new focal abnormality is noted. IMPRESSION: Stable bibasilar changes worse on the right than the left. Electronically Signed   By: Inez Catalina M.D.   On: 11/24/2015 07:48       DISCUSSION:    ASSESSMENT / PLAN:  PULMONARY A:  Acute hypoxic respiratory failure  Tobacco Abuse , likely COPD  effusions rt Edema component P:   Wean aggresize cpap 5 ps 5 Told had apnea, ensure not alkalotic ( unlikely) Consider neg abalnce , despite pressors with worsening edema / fluid on pcxr pcxr in am   CARDIOVASCULAR A:  Septic shock- in setting of pneumoatosis, klebsiella + sputum  Complete Heart Block - s/p temporary pacemaker insertion 3/20 MI    - in sinus per cards 3/22 and pacer dc'ed 11/22/15. On 11/23/15 - back on  Pressors and now HR 130n >> failed cardioversion  P:  Levophed for MAP > 60 Avoid vaso with bowel ischemia Continue ASA Arterial line reads higher than cuff Likely will need LHC at some point Get cortisol if less 20 add stress roids  RENAL A:   Acute kidney injury - in setting of sepsis, MI, CHB, hypotension  Hypomag hypokalemia  P:   k supp Lasix to even to neg Chem in am   GASTROINTESTINAL A:   Small bowel pneumatosis - viable bowel, uncertain etiology, unable to close 3/19 due to CHB, VAC applied Question upper GI bleed   - s/p re-anastomosis 3/21/7 and abd closure  P:   PPI QD  Postoperative management per surgery  Low dose TFs to 20 per ccs  HEMATOLOGIC A:   Anemia - suspect of critical illness + component blood loss   P:  Trend CBC  Monitor for bleeding  Ensure pharmokinetic dvt prevention  INFECTIOUS A:   Septic shock secondary to peritonitis GNR- HCAP P:   Add 10 days stop eraxis Consider 8 days zosyn, but follow gram neg noted sputum  ENDOCRINE A:   Hyperglycemia   P:   SSI with CBG Q4   NEUROLOGIC A:    Post Operative Pain  P:   RASS goal: 0  precedex gtt- consider dc PRN versed for sedation Get abg with apnea fent prn  FAMILY  - Updates:  Sister at bedside 11/21/15 - updated. None at bedside 11/22/15 and 11/23/15  - Inter-disciplinary family meet or Palliative Care meeting due by:  3/24  Ccm time 30 min   Lavon Paganini. Titus Mould, MD, [place] Pgr: [facility]

## 2015-11-25 NOTE — Progress Notes (Signed)
4 Days Post-Op  Subjective: Intubated. No sig issues. Min TF residual  Objective: Vital signs in last 24 hours: Temp:  [98.5 F (36.9 C)-100 F (37.8 C)] 99.4 F (37.4 C) (03/25 0424) Pulse Rate:  [50-96] 73 (03/25 0744) Resp:  [13-24] 23 (03/25 0744) BP: (80-125)/(47-78) 90/59 mmHg (03/25 0744) SpO2:  [98 %-100 %] 100 % (03/25 0744) Arterial Line BP: (83-169)/(42-84) 103/45 mmHg (03/25 0700) FiO2 (%):  [40 %] 40 % (03/25 0744) Weight:  [83.4 kg (183 lb 13.8 oz)] 83.4 kg (183 lb 13.8 oz) (03/25 0400) Last BM Date: 11/23/15  Intake/Output from previous day: 03/24 0701 - 03/25 0700 In: 2815.1 [I.V.:2083.8; NG/GT:201.3; IV Piggyback:530] Out: 1080 [Urine:1080] Intake/Output this shift:    Intubated,  Soft, subtle distension, dressing c/d/i; +BS  Lab Results:   Recent Labs  11/24/15 0320 11/25/15 0430  WBC 14.4* 12.0*  HGB 9.3* 9.3*  HCT 28.1* 27.6*  PLT 260 302   BMET  Recent Labs  11/24/15 0320 11/25/15 0430  NA 141 139  K 3.0* 3.3*  CL 105 107  CO2 28 24  GLUCOSE 129* 128*  BUN 18 15  CREATININE 1.54* 1.15  CALCIUM 7.1* 7.2*   PT/INR  Recent Labs  11/23/15 1042  LABPROT 17.7*  INR 1.45   ABG  Recent Labs  11/23/15 0143  PHART 7.413  HCO3 27.7*    Studies/Results: Dg Chest Port 1 View  11/24/2015  CLINICAL DATA:  Check endotracheal tube placement EXAM: PORTABLE CHEST 1 VIEW COMPARISON:  11/23/2015 FINDINGS: Cardiac shadow is stable. An endotracheal tube, nasogastric catheter and right jugular central line are again seen and stable. Bilateral pleural effusions right greater than left are noted. Bibasilar infiltrates are again seen right greater than left. No new focal abnormality is noted. IMPRESSION: Stable bibasilar changes worse on the right than the left. Electronically Signed   By: Inez Catalina M.D.   On: 11/24/2015 07:48    Anti-infectives: Anti-infectives    Start     Dose/Rate Route Frequency Ordered Stop   11/21/15 1400   piperacillin-tazobactam (ZOSYN) IVPB 3.375 g     3.375 g 12.5 mL/hr over 240 Minutes Intravenous 3 times per day 11/21/15 1205     11/19/15 2000  vancomycin (VANCOCIN) IVPB 1000 mg/200 mL premix  Status:  Discontinued     1,000 mg 200 mL/hr over 60 Minutes Intravenous Every 24 hours 11/19/15 1857 11/24/15 1051   11/18/15 1800  anidulafungin (ERAXIS) 100 mg in sodium chloride 0.9 % 100 mL IVPB     100 mg over 90 Minutes Intravenous Every 24 hours 11/17/15 1812     11/17/15 2200  piperacillin-tazobactam (ZOSYN) IVPB 2.25 g  Status:  Discontinued     2.25 g 100 mL/hr over 30 Minutes Intravenous 3 times per day 11/17/15 1930 11/21/15 1205   11/17/15 1830  anidulafungin (ERAXIS) 200 mg in sodium chloride 0.9 % 200 mL IVPB     200 mg over 180 Minutes Intravenous  Once 11/17/15 1812 11/17/15 2203   11/17/15 1815  vancomycin (VANCOCIN) 1,500 mg in sodium chloride 0.9 % 500 mL IVPB     1,500 mg 250 mL/hr over 120 Minutes Intravenous  Once 11/17/15 1813 11/17/15 2103   11/17/15 1530  piperacillin-tazobactam (ZOSYN) IVPB 3.375 g     3.375 g 12.5 mL/hr over 240 Minutes Intravenous NOW 11/17/15 1521 11/17/15 1942   11/17/15 1515  piperacillin-tazobactam (ZOSYN) IVPB 4.5 g  Status:  Discontinued     4.5 g 200 mL/hr over  30 Minutes Intravenous  Once 11/17/15 1505 11/18/15 0040      Assessment/Plan: Pneumatosis  Exploratory laparotomy 11/17/15--Dr. Georgette Dover Exploratory laparotomy, SBR 11/19/15--Dr. Rosendo Gros Exploratory laparotomy, SB anastomosis and closure--Dr. Ninfa Linden  -tolerating trickle TF yesterday,  advance to 20cc/hr -watch wound(fascia starting to separate inferior aspect of the wound) -BID wet to dry dressing changes  ID-zosyn, eraxis, watch fevers   VTE prophylaxis-SCD, may add chemical VTE prophylaxis from a surgical standpoint  PCM-advancing TF slowly  Leighton Ruff. Redmond Pulling, MD, FACS General, Bariatric, & Minimally Invasive Surgery [facility], [place]    LOS: 8 days     Gayland Curry 11/25/2015

## 2015-11-25 NOTE — Progress Notes (Signed)
[facility] ADULT ICU REPLACEMENT PROTOCOL FOR AM LAB REPLACEMENT ONLY  The patient does apply for the [facility] Adult ICU Electrolyte Replacment Protocol based on the criteria listed below:   1. Is GFR >/= 40 ml/min? Yes.    Patient's GFR today is >60 2. Is urine output >/= 0.5 ml/kg/hr for the last 6 hours? Yes.   Patient's UOP is 0.5 ml/kg/hr 3. Is BUN < 60 mg/dL? Yes.    Patient's BUN today is 15 4. Abnormal electrolyte(s): K3.3 5. Ordered repletion with: per protocol 6. If a panic level lab has been reported, has the CCM MD in charge been notified? Yes.  .   Physician:  Levada Schilling, MD  Fines, Suhre 11/25/2015 5:46 AM

## 2015-11-26 ENCOUNTER — Inpatient Hospital Stay (HOSPITAL_COMMUNITY): Payer: Medicare Other

## 2015-11-26 LAB — COMPREHENSIVE METABOLIC PANEL
ALBUMIN: 1.4 g/dL — AB (ref 3.5–5.0)
ALT: 26 U/L (ref 17–63)
ANION GAP: 11 (ref 5–15)
AST: 49 U/L — AB (ref 15–41)
Alkaline Phosphatase: 267 U/L — ABNORMAL HIGH (ref 38–126)
BILIRUBIN TOTAL: 1.3 mg/dL — AB (ref 0.3–1.2)
BUN: 14 mg/dL (ref 6–20)
CHLORIDE: 102 mmol/L (ref 101–111)
CO2: 28 mmol/L (ref 22–32)
Calcium: 7.6 mg/dL — ABNORMAL LOW (ref 8.9–10.3)
Creatinine, Ser: 1.22 mg/dL (ref 0.61–1.24)
GFR calc Af Amer: >60 mL/min (ref >60)
GFR calc non Af Amer: 60 mL/min — ABNORMAL LOW (ref >60)
GLUCOSE: 141 mg/dL — AB (ref 65–99)
POTASSIUM: 2.8 mmol/L — AB (ref 3.5–5.1)
Sodium: 141 mmol/L (ref 135–145)
TOTAL PROTEIN: 4.5 g/dL — AB (ref 6.5–8.1)

## 2015-11-26 LAB — CBC WITH DIFFERENTIAL/PLATELET
BASOS ABS: 0 10*3/uL (ref 0.0–0.1)
Basophils Relative: 0 %
EOS PCT: 0 %
Eosinophils Absolute: 0 10*3/uL (ref 0.0–0.7)
HEMATOCRIT: 28.1 % — AB (ref 39.0–52.0)
Hemoglobin: 9.4 g/dL — ABNORMAL LOW (ref 13.0–17.0)
LYMPHS ABS: 0.4 10*3/uL — AB (ref 0.7–4.0)
LYMPHS PCT: 4 %
MCH: 29.7 pg (ref 26.0–34.0)
MCHC: 33.5 g/dL (ref 30.0–36.0)
MCV: 88.6 fL (ref 78.0–100.0)
MONO ABS: 0.3 10*3/uL (ref 0.1–1.0)
Monocytes Relative: 2 %
NEUTROS ABS: 10.9 10*3/uL — AB (ref 1.7–7.7)
Neutrophils Relative %: 94 %
Platelets: 364 10*3/uL (ref 150–400)
RBC: 3.17 MIL/uL — AB (ref 4.22–5.81)
RDW: 14 % (ref 11.5–15.5)
WBC: 11.7 10*3/uL — AB (ref 4.0–10.5)

## 2015-11-26 LAB — GLUCOSE, CAPILLARY
GLUCOSE-CAPILLARY: 100 mg/dL — AB (ref 65–99)
GLUCOSE-CAPILLARY: 98 mg/dL (ref 65–99)
Glucose-Capillary: 125 mg/dL — ABNORMAL HIGH (ref 65–99)
Glucose-Capillary: 130 mg/dL — ABNORMAL HIGH (ref 65–99)
Glucose-Capillary: 114 mg/dL — ABNORMAL HIGH (ref 65–99)
Glucose-Capillary: 101 mg/dL — ABNORMAL HIGH (ref 65–99)

## 2015-11-26 MED ORDER — FUROSEMIDE 10 MG/ML IJ SOLN: 40.0000 mg | Freq: Every day | INTRAMUSCULAR | Status: DC

## 2015-11-26 MED ORDER — POTASSIUM CHLORIDE 10 MEQ/50ML IV SOLN
10.0000 meq | INTRAVENOUS | Status: AC
  Administered 2015-11-26 (×5): 10 meq via INTRAVENOUS
  Filled 2015-11-26 (×6): qty 50

## 2015-11-26 MED ORDER — VITAL AF 1.2 CAL PO LIQD
1000.0000 mL | ORAL | Status: DC
  Administered 2015-11-26: 1000 mL

## 2015-11-26 MED ORDER — PRO-STAT SUGAR FREE PO LIQD
30.0000 mL | Freq: Three times a day (TID) | ORAL | Status: DC
  Administered 2015-11-26 (×3): 30 mL
  Filled 2015-11-26 (×2): qty 30

## 2015-11-26 NOTE — Progress Notes (Signed)
PULMONARY / CRITICAL CARE MEDICINE   Name: Shawn [name] MRN: [id number] DOB: [birth year]    ADMISSION DATE:  11/17/2015 CONSULTATION DATE:  003/17/2017  REFERRING MD:  Dr. Redmond Pulling with [facility]  CHIEF COMPLAINT:  Postoperative ventilator management, sepsis management  65 year old male with a PMH of ETOH, tobacco abuse and bipolar disorder who presented to the emergency department on 3/17 with a 3 day hx of abdominal pain, nausea, vomiting and constipation. Found to have CT concerning for diffuse abdominal pneumatosis and elevated lactic acid.  Patient was taken to OR for ex-lap, found to have viable gut, diffuse pneumatosis (no exact cause) & no evidence of dead bowel. He was left open and taken to the MICU.  He developed CHB on 3/18 and MI.  Later 3/19, was taken to OR for wound closure but developed CHB in OR and VAC was applied.  Subsequently taken to cath lab 3/20 for temporary pacemaker insertion.    STUDIES:  3/17  CT abdomen and pelvis >> diffuse extensive small bowel pneumatosis with mild dilation throughout the abdomen and pelvisconsistent with small bowel ischemia. Diffuse central Mesentericand portal venous small gas, Associated diffuse small bowel ileus, extensive abdominal and aortic atherosclerosis, no significant pneumoperitoneum or abscess, cholelithiasis, colonic diverticulosis  CULTURES: 3/17 BCx2 >>ng 3/18 sputum >> abundant klebsiella >> R - ampicillin, otherwise sensitive  3/18  UC >> neg  3/19 BC >> 3/23 resp >. GNR >>  ANTIBIOTICS: Zosyn 3/17 >>  Vanc 3/18 >>off Eraxis 3/18 >>3/27  LINES/TUBES: R IJ TLC 3/17 >>  A Line 3/17 >>  ETT 3/17 >>    SIGNIFICANT EVENTS: 3/17 Admission, exploratory laparotomy 3/18 Complete heart block 3/18 MI - EF 45% on echo 3/19 OR for wound closure 3/20 Cath lab for temp pacemaker  3/20 -   RT reports pt returned from cath lab after temporary pacemaker insertion to holding, pending transfer to ICU.  Pt remains  febrile to 101, requiring 12 mcg levophed  11/21/15 - EP reports acc jnl but has cleared patient for surgery. To OR today per CCCS notes for reanastamosis.  K given earlier today. Mag at 1.7. Curently on levophed gtt and fent gtt. Improving AKI.   11/22/15 - s/o small bowel anast and abd closure yesterday . Cards wants to diurese once bp improves. Off pressors this AM but has low grade temps/ CCS ok with heparin resumption and extubation when appropriate. No further OR trips planned.  Per RN - on fent gtt -> restless and not followng commands  11/23/15 - pacer dc'ed by cards yesterday. CCS holding off tube feds for now.  possible SVT HR 130.   SUBJECTIVE/OVERNIGHT/INTERVAL HX Some agitation Levophed off Neg 1.8 liters  VITAL SIGNS: BP 111/59 mmHg  Pulse 68  Temp(Src) 98.3 F (36.8 C) (Axillary)  Resp 15  Ht 5\' 10"  (1.778 m)  Wt 80 kg (176 lb 5.9 oz)  BMI 25.31 kg/m2  SpO2 100%  HEMODYNAMICS:    VENTILATOR SETTINGS: Vent Mode:  [-] PRVC FiO2 (%):  [40 %] 40 % Set Rate:  [10 bmp] 10 bmp Vt Set:  [590 mL] 590 mL PEEP:  [5 cmH20] 5 cmH20 Pressure Support:  [10 cmH20] 10 cmH20 Plateau Pressure:  [9 cmH20-18 cmH20] 9 cmH20  INTAKE / OUTPUT: I/O last 3 completed shifts: In: 4029.8 [I.V.:2479.8; Other:200; NG/GT:820; IV Piggyback:530] Out: 5205 [Urine:5205]  PHYSICAL EXAMINATION: General:  Thin adult male in NAD on vent Neuro:  awake on vent, fc well HEENT:  ETT, OGT  in place. Cardiovascular:  s1s2 distant, regular Lungs: coarse improved Abdomen:  Wound vac-in place c/d/i Musculoskeletal:   Chronic discoloration BLEs from poor circulation Skin:  No rashes    LABS:  PULMONARY  Recent Labs Lab 11/23/15 0143 11/25/15 1018  PHART 7.413 7.478*  PCO2ART 43.6 35.2  PO2ART 79.0* 88.0  HCO3 27.7* 26.1*  TCO2 29 27  O2SAT 95.0 97.0    CBC  Recent Labs Lab 11/24/15 0320 11/25/15 0430 11/26/15 0400  HGB 9.3* 9.3* 9.4*  HCT 28.1* 27.6* 28.1*  WBC 14.4* 12.0*  11.7*  PLT 260 302 364    COAGULATION  Recent Labs Lab 11/23/15 1042  INR 1.45    CARDIAC    Recent Labs Lab 11/19/15 1635 11/19/15 2330 11/23/15 1042 11/23/15 1708 11/24/15 0320  TROPONINI 0.62* 0.56* 0.14* 0.06* 0.06*   No results for input(s): PROBNP in the last 168 hours.   CHEMISTRY  Recent Labs Lab 11/22/15 0538 11/23/15 0240 11/23/15 0319 11/23/15 1042 11/24/15 0320 11/25/15 0430 11/26/15 0400  NA 141 141 142 143 141 139 141  K 3.9 4.0 4.3 3.7 3.0* 3.3* 2.8*  CL 111 107 106 110 105 107 102  CO2 22 25 23 24 28 24 28   GLUCOSE 127* 111* 104* 114* 129* 128* 141*  BUN 14 19 19  21* 18 15 14   CREATININE 1.38* 1.82* 1.73* 1.80* 1.54* 1.15 1.22  CALCIUM 7.0* 7.1* 7.0* 6.8* 7.1* 7.2* 7.6*  MG 1.7 1.9 1.8  --  2.0 1.9  --   PHOS 2.0*  --  3.6  --  2.3* 2.6  --    Estimated Creatinine Clearance: 60.7 mL/min (by C-G formula based on Cr of 1.22).   LIVER  Recent Labs Lab 11/21/15 0417 11/23/15 1042 11/26/15 0400  AST 27 29 49*  ALT 17 19 26   ALKPHOS 137* 162* 267*  BILITOT 1.5* 1.6* 1.3*  PROT 3.8* 3.8* 4.5*  ALBUMIN 1.3* 1.2* 1.4*  INR  --  1.45  --      INFECTIOUS  Recent Labs Lab 11/20/15 0630 11/23/15 1031 11/23/15 1045 11/24/15 0320 11/25/15 0430  LATICACIDVEN 1.0 1.3  --   --   --   PROCALCITON  --   --  3.34 2.04 0.88     ENDOCRINE CBG (last 3)   Recent Labs  11/25/15 2310 11/26/15 0321 11/26/15 0747  GLUCAP 125* 130* 114*         IMAGING x48h  - image(s) personally visualized  -   highlighted in bold Dg Chest Port 1 View  11/26/2015  CLINICAL DATA:  Pulmonary edema EXAM: PORTABLE CHEST 1 VIEW COMPARISON:  11/24/2015 FINDINGS: Cardiomediastinal silhouette is stable. Endotracheal tube in place with tip 3.9 cm above the carina. Stable right IJ central line with tip in SVC. Stable NG tube position. No pulmonary edema. Persistent hazy bilateral basilar atelectasis or infiltrate right greater than left. Slight improvement  in aeration in upper lungs. IMPRESSION: Stable support apparatus. Slight improvement in aeration in upper lungs. Persistent bilateral basilar hazy atelectasis or infiltrate right greater than left. No pulmonary edema. Electronically Signed   By: Lahoma Crocker M.D.   On: 11/26/2015 09:48       DISCUSSION:    ASSESSMENT / PLAN:  PULMONARY A: Acute hypoxic respiratory failure  Tobacco Abuse , likely COPD  effusions rt Edema component P:   Wean aggresize cpap 5 ps 5, okay with rr 8 and T V400 cc Stimulate  Neg balance has improved his pcxr, some increase  crt, see renal Upright position in bed with abdo   CARDIOVASCULAR A:  Septic shock- in setting of pneumoatosis, klebsiella + sputum  Complete Heart Block - s/p temporary pacemaker insertion 3/20 MI  Adrenal insuff  - in sinus per cards 3/22 and pacer dc'ed 11/22/15. On 11/23/15 - back on  Pressors and now HR 130n >> failed cardioversion  P:  Levophed for MAP > 60, off Avoid vaso with bowel ischemia Continue ASA Arterial line reads higher than cuff Stress steroids indicated, if remains off pressors in am , will reduce  RENAL A:   Acute kidney injury - ATN hypokalemia  P:   k supp Lasix to even to neg, reduce Chem in am   GASTROINTESTINAL A:   Small bowel pneumatosis - viable bowel, uncertain etiology, unable to close 3/19 due to CHB, VAC applied Question upper GI bleed   - s/p re-anastomosis 3/21/7 and abd closure  P:   PPI QD  Postoperative management per surgery  Low dose TFs to 20 per ccs, CCS has approved increase to goal, d/w RN  HEMATOLOGIC A:   Anemia - suspect of critical illness + component blood loss   P:  Trend CBC  subq  hep  INFECTIOUS A:   Septic shock secondary to peritonitis GNR- HCAP P:   Add 10 days stop eraxis - in place Consider 8 days zosyn, kleib noted, will keep abdo coverage zosyn empiric regardless of sens noted from sputum  ENDOCRINE A:   Hyperglycemia   P:   SSI with  CBG Q4   NEUROLOGIC A:   Post Operative Pain  P:   RASS goal: 0 PRN versed for sedation  FAMILY  - Updates:  Sister at bedside 11/21/15 - updated. None at bedside 11/22/15 and 11/23/15  - Inter-disciplinary family meet or Palliative Care meeting due by:  3/24  Ccm time 30 min   Lavon Paganini. Titus Mould, MD, [place] Pgr: [facility]

## 2015-11-26 NOTE — Progress Notes (Signed)
5 Days Post-Op  Subjective: Awake on vent  Objective: Vital signs in last 24 hours: Temp:  [97.7 F (36.5 C)-99 F (37.2 C)] 98.3 F (36.8 C) (03/26 0753) Pulse Rate:  [44-104] 93 (03/26 0730) Resp:  [9-29] 29 (03/26 0730) BP: (91-146)/(60-90) 126/69 mmHg (03/26 0730) SpO2:  [94 %-100 %] 100 % (03/26 0730) Arterial Line BP: (67-172)/(44-133) 67/57 mmHg (03/26 0300) FiO2 (%):  [40 %] 40 % (03/26 0742) Weight:  [80 kg (176 lb 5.9 oz)] 80 kg (176 lb 5.9 oz) (03/26 0310) Last BM Date: 11/23/15  Intake/Output from previous day: 03/25 0701 - 03/26 0700 In: 2696.2 [I.V.:1486.2; NG/GT:680; IV Piggyback:330] Out: 4700 [Urine:4700] Intake/Output this shift:    GI: soft approp tender wound with some minimal separation superiorly no dehiscence clean, some bs  Lab Results:   Recent Labs  11/25/15 0430 11/26/15 0400  WBC 12.0* 11.7*  HGB 9.3* 9.4*  HCT 27.6* 28.1*  PLT 302 364   BMET  Recent Labs  11/25/15 0430 11/26/15 0400  NA 139 141  K 3.3* 2.8*  CL 107 102  CO2 24 28  GLUCOSE 128* 141*  BUN 15 14  CREATININE 1.15 1.22  CALCIUM 7.2* 7.6*   PT/INR  Recent Labs  11/23/15 1042  LABPROT 17.7*  INR 1.45   ABG  Recent Labs  11/25/15 1018  PHART 7.478*  HCO3 26.1*    Studies/Results: No results found.  Anti-infectives: Anti-infectives    Start     Dose/Rate Route Frequency Ordered Stop   11/21/15 1400  piperacillin-tazobactam (ZOSYN) IVPB 3.375 g     3.375 g 12.5 mL/hr over 240 Minutes Intravenous 3 times per day 11/21/15 1205     11/19/15 2000  vancomycin (VANCOCIN) IVPB 1000 mg/200 mL premix  Status:  Discontinued     1,000 mg 200 mL/hr over 60 Minutes Intravenous Every 24 hours 11/19/15 1857 11/24/15 1051   11/18/15 1800  anidulafungin (ERAXIS) 100 mg in sodium chloride 0.9 % 100 mL IVPB     100 mg over 90 Minutes Intravenous Every 24 hours 11/17/15 1812 11/27/15 2359   11/17/15 2200  piperacillin-tazobactam (ZOSYN) IVPB 2.25 g  Status:   Discontinued     2.25 g 100 mL/hr over 30 Minutes Intravenous 3 times per day 11/17/15 1930 11/21/15 1205   11/17/15 1830  anidulafungin (ERAXIS) 200 mg in sodium chloride 0.9 % 200 mL IVPB     200 mg over 180 Minutes Intravenous  Once 11/17/15 1812 11/17/15 2203   11/17/15 1815  vancomycin (VANCOCIN) 1,500 mg in sodium chloride 0.9 % 500 mL IVPB     1,500 mg 250 mL/hr over 120 Minutes Intravenous  Once 11/17/15 1813 11/17/15 2103   11/17/15 1530  piperacillin-tazobactam (ZOSYN) IVPB 3.375 g     3.375 g 12.5 mL/hr over 240 Minutes Intravenous NOW 11/17/15 1521 11/17/15 1942   11/17/15 1515  piperacillin-tazobactam (ZOSYN) IVPB 4.5 g  Status:  Discontinued     4.5 g 200 mL/hr over 30 Minutes Intravenous  Once 11/17/15 1505 11/18/15 0040      Assessment/Plan: Pneumatosis  Exploratory laparotomy 11/17/15--Dr. Georgette Dover Exploratory laparotomy, SBR 11/19/15--Dr. Rosendo Gros Exploratory laparotomy, SB anastomosis and closure--Dr. Ninfa Linden  -tolerating trickle TF yesterday, can advance as tolerated as of now -monitor wound(fascia starting to separate inferior aspect of the wound) -BID wet to dry dressing changes  ID-zosyn, eraxis  VTE prophylaxis-SCD, on sq heparin  [facility] 11/26/2015

## 2015-11-26 NOTE — Progress Notes (Signed)
Brief Nutrition Note  Consult received for enteral/tube feeding initiation and management.  Adult Enteral Nutrition Protocol already initiated. TF order adjusted to reflect recommendations provided in nutrition note 3/24. RD to follow-up 3/27.  Admitting Dx: GI bleed [K92.2] Mesenteric ischemia (HCC) [K55.9]  Body mass index is 25.31 kg/(m^2). Pt meets criteria for overweight based on current BMI.  Labs:   Recent Labs Lab 11/23/15 0319  11/24/15 0320 11/25/15 0430 11/26/15 0400  NA 142  < > 141 139 141  K 4.3  < > 3.0* 3.3* 2.8*  CL 106  < > 105 107 102  CO2 23  < > 28 24 28   BUN 19  < > 18 15 14   CREATININE 1.73*  < > 1.54* 1.15 1.22  CALCIUM 7.0*  < > 7.1* 7.2* 7.6*  MG 1.8  --  2.0 1.9  --   PHOS 3.6  --  2.3* 2.6  --   GLUCOSE 104*  < > 129* 128* 141*  < > = values in this interval not displayed.  Clayton Bibles, MS, RD, LDN Pager: [phone] After Hours Pager: [phone]

## 2015-11-26 NOTE — Procedures (Signed)
Extubation Procedure Note  Patient Details:   Name: Shawn [name] DOB: [birth year] MRN: [id number]   Airway Documentation:  Airway (Active)    Evaluation  O2 sats: stable throughout Complications: No apparent complications Patient did tolerate procedure well. Bilateral Breath Sounds: Diminished, Rhonchi Suctioning: Airway Yes   Pt extubated to 4L [place].  No stridor noted.  RN at bedside.  Donnetta Hail 11/26/2015, 11:23 AM

## 2015-11-26 NOTE — Progress Notes (Signed)
[place] Progress Note Patient Name: Shawn [name] DOB: 07-04-1951 MRN: [id number]   Date of Service  11/26/2015  HPI/Events of Note  K+ = 2.8 and Creatinine = 1.22.  eICU Interventions  Will replete K+.     Intervention Category Intermediate Interventions: Electrolyte abnormality - evaluation and management  Merrillyn Ackerley Eugene 11/26/2015, 5:27 AM

## 2015-11-27 ENCOUNTER — Inpatient Hospital Stay (HOSPITAL_COMMUNITY): Payer: Medicare Other

## 2015-11-27 DIAGNOSIS — D72829 Elevated white blood cell count, unspecified: Secondary | ICD-10-CM

## 2015-11-27 DIAGNOSIS — Z9289 Personal history of other medical treatment: Secondary | ICD-10-CM | POA: Insufficient documentation

## 2015-11-27 DIAGNOSIS — Z9911 Dependence on respirator [ventilator] status: Secondary | ICD-10-CM

## 2015-11-27 DIAGNOSIS — F3170 Bipolar disorder, currently in remission, most recent episode unspecified: Secondary | ICD-10-CM | POA: Insufficient documentation

## 2015-11-27 DIAGNOSIS — R0682 Tachypnea, not elsewhere classified: Secondary | ICD-10-CM | POA: Insufficient documentation

## 2015-11-27 DIAGNOSIS — E876 Hypokalemia: Secondary | ICD-10-CM

## 2015-11-27 DIAGNOSIS — R1310 Dysphagia, unspecified: Secondary | ICD-10-CM | POA: Insufficient documentation

## 2015-11-27 DIAGNOSIS — K6389 Other specified diseases of intestine: Secondary | ICD-10-CM

## 2015-11-27 DIAGNOSIS — D62 Acute posthemorrhagic anemia: Secondary | ICD-10-CM | POA: Insufficient documentation

## 2015-11-27 DIAGNOSIS — N179 Acute kidney failure, unspecified: Secondary | ICD-10-CM

## 2015-11-27 DIAGNOSIS — K559 Vascular disorder of intestine, unspecified: Secondary | ICD-10-CM | POA: Insufficient documentation

## 2015-11-27 DIAGNOSIS — F1910 Other psychoactive substance abuse, uncomplicated: Secondary | ICD-10-CM

## 2015-11-27 DIAGNOSIS — R000 Tachycardia, unspecified: Secondary | ICD-10-CM | POA: Insufficient documentation

## 2015-11-27 LAB — GLUCOSE, CAPILLARY
GLUCOSE-CAPILLARY: 110 mg/dL — AB (ref 65–99)
GLUCOSE-CAPILLARY: 86 mg/dL (ref 65–99)
GLUCOSE-CAPILLARY: 95 mg/dL (ref 65–99)
GLUCOSE-CAPILLARY: 108 mg/dL — AB (ref 65–99)
Glucose-Capillary: 111 mg/dL — ABNORMAL HIGH (ref 65–99)
Glucose-Capillary: 98 mg/dL (ref 65–99)

## 2015-11-27 LAB — CBC WITH DIFFERENTIAL/PLATELET
Basophils Absolute: 0 10*3/uL (ref 0.0–0.1)
Basophils Relative: 0 %
Eosinophils Absolute: 0 10*3/uL (ref 0.0–0.7)
Eosinophils Relative: 0 %
HEMATOCRIT: 26.2 % — AB (ref 39.0–52.0)
HEMOGLOBIN: 8.5 g/dL — AB (ref 13.0–17.0)
LYMPHS ABS: 0.4 10*3/uL — AB (ref 0.7–4.0)
LYMPHS PCT: 3 %
MCH: 29.1 pg (ref 26.0–34.0)
MCHC: 32.4 g/dL (ref 30.0–36.0)
MCV: 89.7 fL (ref 78.0–100.0)
MONOS PCT: 2 %
Monocytes Absolute: 0.2 10*3/uL (ref 0.1–1.0)
NEUTROS ABS: 12.8 10*3/uL — AB (ref 1.7–7.7)
NEUTROS PCT: 95 %
Platelets: 457 10*3/uL — ABNORMAL HIGH (ref 150–400)
RBC: 2.92 MIL/uL — AB (ref 4.22–5.81)
RDW: 14.3 % (ref 11.5–15.5)
WBC: 13.4 10*3/uL — ABNORMAL HIGH (ref 4.0–10.5)

## 2015-11-27 LAB — PHOSPHORUS: Phosphorus: 3 mg/dL (ref 2.5–4.6)

## 2015-11-27 LAB — BASIC METABOLIC PANEL
ANION GAP: 10 (ref 5–15)
BUN: 15 mg/dL (ref 6–20)
CHLORIDE: 103 mmol/L (ref 101–111)
CO2: 30 mmol/L (ref 22–32)
Calcium: 7.8 mg/dL — ABNORMAL LOW (ref 8.9–10.3)
Creatinine, Ser: 1.04 mg/dL (ref 0.61–1.24)
GFR calc non Af Amer: >60 mL/min (ref >60)
Glucose, Bld: 116 mg/dL — ABNORMAL HIGH (ref 65–99)
POTASSIUM: 3.2 mmol/L — AB (ref 3.5–5.1)
Sodium: 143 mmol/L (ref 135–145)

## 2015-11-27 LAB — MAGNESIUM: MAGNESIUM: 1.7 mg/dL (ref 1.7–2.4)

## 2015-11-27 MED ORDER — AMIODARONE HCL 200 MG PO TABS
400.0000 mg | ORAL_TABLET | Freq: Two times a day (BID) | ORAL | Status: DC
  Administered 2015-11-27 – 2015-11-28 (×4): 400 mg via ORAL
  Filled 2015-11-27 (×5): qty 2

## 2015-11-27 MED ORDER — POTASSIUM CHLORIDE 10 MEQ/50ML IV SOLN
10.0000 meq | INTRAVENOUS | Status: AC
  Administered 2015-11-27 (×4): 10 meq via INTRAVENOUS
  Filled 2015-11-27 (×3): qty 50

## 2015-11-27 MED ORDER — PANTOPRAZOLE SODIUM 40 MG PO TBEC
40.0000 mg | DELAYED_RELEASE_TABLET | Freq: Every day | ORAL | Status: DC
  Administered 2015-11-27 – 2015-11-29 (×3): 40 mg via ORAL
  Filled 2015-11-27 (×4): qty 1

## 2015-11-27 MED ORDER — BOOST / RESOURCE BREEZE PO LIQD
1.0000 | Freq: Three times a day (TID) | ORAL | Status: DC
  Administered 2015-11-27 – 2015-11-28 (×5): 1 via ORAL

## 2015-11-27 MED ORDER — ASPIRIN 81 MG PO CHEW
81.0000 mg | CHEWABLE_TABLET | Freq: Every day | ORAL | Status: DC
  Administered 2015-11-28: 81 mg via ORAL
  Filled 2015-11-27 (×2): qty 1

## 2015-11-27 MED ORDER — MAGNESIUM SULFATE 2 GM/50ML IV SOLN
2.0000 g | Freq: Once | INTRAVENOUS | Status: AC
  Administered 2015-11-27: 2 g via INTRAVENOUS
  Filled 2015-11-27: qty 50

## 2015-11-27 MED ORDER — FUROSEMIDE 40 MG PO TABS
40.0000 mg | ORAL_TABLET | Freq: Every day | ORAL | Status: DC
  Administered 2015-11-27 – 2015-11-28 (×2): 40 mg via ORAL
  Filled 2015-11-27 (×3): qty 1

## 2015-11-27 MED ORDER — HYDROCORTISONE NA SUCCINATE PF 100 MG IJ SOLR
50.0000 mg | Freq: Two times a day (BID) | INTRAMUSCULAR | Status: DC
  Administered 2015-11-27 – 2015-11-28 (×2): 50 mg via INTRAVENOUS
  Filled 2015-11-27 (×2): qty 2

## 2015-11-27 MED ORDER — OXYCODONE-ACETAMINOPHEN 5-325 MG PO TABS
1.0000 | ORAL_TABLET | ORAL | Status: DC | PRN
  Administered 2015-11-28 – 2015-11-29 (×2): 2 via ORAL
  Filled 2015-11-27 (×2): qty 2

## 2015-11-27 MED ORDER — LACTATED RINGERS IV SOLN
INTRAVENOUS | Status: DC
  Administered 2015-11-27: 14:00:00 via INTRAVENOUS

## 2015-11-27 NOTE — Consult Note (Signed)
Physical Medicine and Rehabilitation Consult Reason for Consult: Debilitation/multi-medical Referring Physician: Critical care   HPI: Shawn [name] is a 65 y.o. right handed male with history of polysubstance abuse alcohol abuse, bipolar disorder. Patient lives alone but plans to stay with his sister who is retired. Patient independent prior to admission. One level home. Presented 11/17/2015 with 3 day history of abdominal pain with nausea and vomiting as well as constipation with fever and chills. Patient noted history of small bowel resection in the past for incarcerated femoral hernia perforation in 2013 and again in 2016. Findings of lactic acid of 12, serum creatinine 6.7. CT of abdomen and pelvis with extensive small bowel pneumatosis, mesenteric and portal venous gas. Underwent exploratory laparotomy 11/17/2015 and found to have viable gut no evidence of ischemic bowel. Wound was left open until 11/21/2015 and underwent closure postoperative ventilatory management. Hospital course cardiology services consulted 11/18/2015 for findings of complete heart block/STEMI with heart rate in the 30s developed A. fib with RVR. Echocardiogram with ejection fraction of 45%. Severe inferior lateral hypokinesis to akinesis. Patient did receive a temporary pacemaker for short time. Placed on intravenous amiodarone. Attempted cardioversion failed. Patient extubated 11/26/2015. Creatinine has improved to 1.04. Subcutaneous heparin later added for DVT prophylaxis. Currently on a clear liquid diet and advanced as tolerated.   Review of Systems  Constitutional: Positive for fever and chills.  HENT: Negative for hearing loss.   Eyes: Negative for blurred vision and double vision.  Respiratory: Positive for shortness of breath. Negative for cough.   Cardiovascular: Positive for palpitations and leg swelling. Negative for chest pain.  Gastrointestinal: Positive for nausea, vomiting, abdominal pain and  constipation.  Genitourinary: Negative for dysuria and hematuria.  Musculoskeletal: Positive for myalgias and joint pain.  Skin: Negative for rash.  Neurological: Positive for weakness. Negative for seizures, loss of consciousness and headaches.  All other systems reviewed and are negative.  Past Medical History  Diagnosis Date  . Osteoarthritis X years    left knee.  Distant hx (age 77) "dislocated" knee and required surgery, then crush injury to patella required knee cap removal.  . Diverticular disease     "itis" x 2 episodes (last was about 2007)  . Tobacco dependence   . Multiple rib fractures 10/09/10    s/p fall down a ravine--9th and 10th on right, contusion of left 7th and 8th ribs  . Fracture of metatarsal bone(s), closed     3rd, 4th, 5th mid shaft on left foot  . History of substance abuse     cocaine, marijuana, acid, alcohol (in remission since 2010)  . Solar dermatitis     face and ears  . Bipolar disorder [facility])     Has had multiple psych hospitalization in the past, most recently at [facility] [facility] 01/31/11-02/04/11.  Says meds made him emotionally blunted. (lamictal and wellbutrin).  . Alcoholism ([place])     "Dry" since 2002; relapse 2014  . Depression   . Anxiety   . Vitamin B12 deficiency 10/2013    IF ab positive.  Replacement therapy started end of March 2015   Past Surgical History  Procedure Laterality Date  . Knee dislocation surgery  1966  . Patellectomy  1968    s/p crush injury  . Inguinal hernia repair      left  . Hernia repair      at 65 years of age  34  . Total knee arthroplasty  11/20/2011  Procedure: TOTAL KNEE ARTHROPLASTY;  Surgeon: Ninetta Lights, MD;  Location: [place];  Service: Orthopedics;  Laterality: Left;  DR Percell Miller WANTS 90MINUTES FOR THIS CASE  . Inguinal hernia repair  06/15/2012    Procedure: HERNIA REPAIR INGUINAL INCARCERATED;  Surgeon: Edward Jolly, MD;  Location: [facility];  Service: General;  Laterality: Right;  .  Bowel resection  06/15/2012    Procedure: SMALL BOWEL RESECTION;  Surgeon: Edward Jolly, MD;  Location: [facility];  Service: General;  Laterality: N/A;  . Femur im nail  08/06/2012    Procedure: INTRAMEDULLARY (IM) NAIL FEMORAL;  Surgeon: Johnn Hai, MD;  Location: [facility];  Service: Orthopedics;  Laterality: Left;  . Laparotomy N/A 11/17/2015    Procedure: EXPLORATORY LAPAROTOMY;  Surgeon: Donnie Mesa, MD;  Location: [place];  Service: General;  Laterality: N/A;  . Laparotomy N/A 11/19/2015    Procedure: EXPLORATORY LAPAROTOMY OPEN ABDOMEN ABDOMINAL WOUND VAC CHANGE;  Surgeon: Ralene Ok, MD;  Location: [place];  Service: General;  Laterality: N/A;  . Bowel resection  11/19/2015    Procedure: SMALL BOWEL RESECTION;  Surgeon: Ralene Ok, MD;  Location: [facility];  Service: General;;  . Cardiac catheterization N/A 11/20/2015    Procedure: Temporary Wire;  Surgeon: Evans Lance, MD;  Location: [facility];  Service: Cardiovascular;  Laterality: N/A;  . Small bowel repair N/A 11/21/2015    Procedure: SMALL BOWEL REPAIR, SMALL BOWEL ANATAMOSIS AND ABDOMINAL CLOSURE;  Surgeon: Coralie Keens, MD;  Location: [facility];  Service: General;  Laterality: N/A;   Family History  Problem Relation Age of Onset  . Arthritis Mother   . Arthritis Father   . Cancer Brother     lung cancer.  Died in early 72s.   Social History:  reports that he has been smoking Cigarettes.  He has a 30 pack-year smoking history. He has never used smokeless tobacco. He reports that he drinks alcohol. He reports that he does not use illicit drugs. Allergies:  Allergies  Allergen Reactions  . Codeine Nausea Only and Other (See Comments)    flushing  . Depakote [Divalproex Sodium] Other (See Comments)    Makes me crazy   Medications Prior to Admission  Medication Sig Dispense Refill  . diclofenac (VOLTAREN) 75 MG EC tablet Take 75 mg by mouth 2 (two) times daily.    Marland Kitchen dicyclomine (BENTYL) 10 MG capsule Take  10 mg by mouth 4 (four) times daily.    Marland Kitchen escitalopram (LEXAPRO) 10 MG tablet Take 10 mg by mouth at bedtime.    . folic acid (FOLVITE) 1 MG tablet Take 1 mg by mouth daily.    Marland Kitchen lamoTRIgine (LAMICTAL) 200 MG tablet Take 200 mg by mouth at bedtime.    . Multiple Vitamin (MULTIVITAMIN WITH MINERALS) TABS tablet Take 1 tablet by mouth daily.    . QUEtiapine (SEROQUEL) 300 MG tablet Take 300 mg by mouth at bedtime.    . traMADol-acetaminophen (ULTRACET) 37.5-325 MG tablet Take 1 tablet by mouth every 4 (four) hours as needed (pain).    . hydroxypropyl methylcellulose (ISOPTO TEARS) 2.5 % ophthalmic solution Place 2 drops into both eyes as needed for dry eyes.      Home: Home Living Family/patient expects to be discharged to:: Inpatient rehab Living Arrangements: Other relatives Available Help at Discharge: Family, Available 24 hours/day Type of Home: House Home Access: Ramped entrance [place]: One level Home Equipment: None Additional Comments: lives alone, but plans to stay  with his sister who is retired  Functional History: Prior Function Level of Independence: Independent Functional Status:  Mobility: Bed Mobility General bed mobility comments: just up to chair with nursing Transfers Overall transfer level: Needs assistance Equipment used: Rolling walker (2 wheeled) Transfers: Sit to/from Stand Sit to Stand: +2 physical assistance, Mod assist General transfer comment: stood first attempt wtih +1 assist and difficulty getting upright due to anxiety, L LE weakness and poor tolerance with HR up to 123.  Second attempt up with nursing assist and mod +2 A with cues for hand placement esp on L when pt reaching for bed with L UE due to L LE weakness Ambulation/Gait General Gait Details: unable    ADL:    Cognition: Cognition Overall Cognitive Status: No family/caregiver present to determine baseline cognitive functioning Orientation Level: Oriented to  person Cognition Arousal/Alertness: Awake/alert Behavior During Therapy: Anxious Overall Cognitive Status: No family/caregiver present to determine baseline cognitive functioning  Blood pressure 130/70, pulse 75, temperature 97.7 F (36.5 C), temperature source Oral, resp. rate 21, height 5\' 10"  (1.778 m), weight 78.4 kg (172 lb 13.5 oz), SpO2 99 %. Physical Exam  Vitals reviewed. Constitutional: He is oriented to person, place, and time. No distress.  Frail  HENT:  Head: Normocephalic.  Mouth/Throat: Oropharynx is clear and moist.  Eyes: Conjunctivae and EOM are normal.  Neck: Neck supple. No thyromegaly present.  Cardiovascular:  Cardiac rate controlled  Respiratory: Effort normal.  Decreased breath sounds at the bases  GI: Soft. Bowel sounds are normal.  Wet-to-dry dressing to abdominal wound  Musculoskeletal: He exhibits no edema or tenderness.  Neurological: He is alert and oriented to person, place, and time.  Makes good eye contact with examiner.  Follows simple commands. Sensation intact light touch Motor: Bilateral upper extremities proximally 4+/5, distally 5/5 Bilateral lower extremities: Proximally 4/5, distally 5/5  Skin: Skin is warm and dry. He is not diaphoretic.  Dressing to neck and abdomen c/d/i  Psychiatric: His behavior is normal. Thought content normal.    Results for orders placed or performed during the hospital encounter of 11/17/15 (from the past 24 hour(s))  Glucose, capillary     Status: Abnormal   Collection Time: 11/26/15  3:41 PM  Result Value Ref Range   Glucose-Capillary 101 (H) 65 - 99 mg/dL   Comment 1 Capillary Specimen    Comment 2 Notify RN   Glucose, capillary     Status: None   Collection Time: 11/26/15  7:45 PM  Result Value Ref Range   Glucose-Capillary 98 65 - 99 mg/dL   Comment 1 Notify RN    Comment 2 Document in Chart   Glucose, capillary     Status: Abnormal   Collection Time: 11/27/15 12:19 AM  Result Value Ref Range    Glucose-Capillary 111 (H) 65 - 99 mg/dL   Comment 1 Notify RN    Comment 2 Document in Chart   Basic metabolic panel     Status: Abnormal   Collection Time: 11/27/15  3:41 AM  Result Value Ref Range   Sodium 143 135 - 145 mmol/L   Potassium 3.2 (L) 3.5 - 5.1 mmol/L   Chloride 103 101 - 111 mmol/L   CO2 30 22 - 32 mmol/L   Glucose, Bld 116 (H) 65 - 99 mg/dL   BUN 15 6 - 20 mg/dL   Creatinine, Ser 1.04 0.61 - 1.24 mg/dL   Calcium 7.8 (L) 8.9 - 10.3 mg/dL   GFR calc non Af  Amer >60 >60 mL/min   GFR calc Af Amer >60 >60 mL/min   Anion gap 10 5 - 15  CBC with Differential/Platelet     Status: Abnormal   Collection Time: 11/27/15  3:41 AM  Result Value Ref Range   WBC 13.4 (H) 4.0 - 10.5 K/uL   RBC 2.92 (L) 4.22 - 5.81 MIL/uL   Hemoglobin 8.5 (L) 13.0 - 17.0 g/dL   HCT 26.2 (L) 39.0 - 52.0 %   MCV 89.7 78.0 - 100.0 fL   MCH 29.1 26.0 - 34.0 pg   MCHC 32.4 30.0 - 36.0 g/dL   RDW 14.3 11.5 - 15.5 %   Platelets 457 (H) 150 - 400 K/uL   Neutrophils Relative % 95 %   Neutro Abs 12.8 (H) 1.7 - 7.7 K/uL   Lymphocytes Relative 3 %   Lymphs Abs 0.4 (L) 0.7 - 4.0 K/uL   Monocytes Relative 2 %   Monocytes Absolute 0.2 0.1 - 1.0 K/uL   Eosinophils Relative 0 %   Eosinophils Absolute 0.0 0.0 - 0.7 K/uL   Basophils Relative 0 %   Basophils Absolute 0.0 0.0 - 0.1 K/uL  Phosphorus     Status: None   Collection Time: 11/27/15  3:41 AM  Result Value Ref Range   Phosphorus 3.0 2.5 - 4.6 mg/dL  Magnesium     Status: None   Collection Time: 11/27/15  3:41 AM  Result Value Ref Range   Magnesium 1.7 1.7 - 2.4 mg/dL  Glucose, capillary     Status: Abnormal   Collection Time: 11/27/15  3:45 AM  Result Value Ref Range   Glucose-Capillary 110 (H) 65 - 99 mg/dL   Comment 1 Notify RN    Comment 2 Document in Chart   Glucose, capillary     Status: None   Collection Time: 11/27/15  8:39 AM  Result Value Ref Range   Glucose-Capillary 86 65 - 99 mg/dL   Comment 1 Notify RN   Glucose, capillary      Status: None   Collection Time: 11/27/15 12:16 PM  Result Value Ref Range   Glucose-Capillary 95 65 - 99 mg/dL   Comment 1 Notify RN    Dg Chest Port 1 View  11/27/2015  CLINICAL DATA:  Pulmonary edema EXAM: PORTABLE CHEST 1 VIEW COMPARISON:  November 26, 2015 FINDINGS: Endotracheal tube and nasogastric tube have been removed. Central catheter tip is in the superior vena cava. No pneumothorax. There is persistent consolidation in the left base with small left effusion. There has been interval clearing of airspace opacity from the right base. Currently there is a slight degree of underlying interstitial pulmonary edema. Heart is upper normal in size with pulmonary vascularity within normal limits. IMPRESSION: Trace interstitial edema. Airspace consolidation left base with small left effusion. Suspect a degree of pneumonia in the left base region. Airspace opacity from the right base has cleared. No change in cardiac silhouette. Electronically Signed   By: Lowella Grip III M.D.   On: 11/27/2015 07:37   Dg Chest Port 1 View  11/26/2015  CLINICAL DATA:  Pulmonary edema EXAM: PORTABLE CHEST 1 VIEW COMPARISON:  11/24/2015 FINDINGS: Cardiomediastinal silhouette is stable. Endotracheal tube in place with tip 3.9 cm above the carina. Stable right IJ central line with tip in SVC. Stable NG tube position. No pulmonary edema. Persistent hazy bilateral basilar atelectasis or infiltrate right greater than left. Slight improvement in aeration in upper lungs. IMPRESSION: Stable support apparatus. Slight improvement in aeration  in upper lungs. Persistent bilateral basilar hazy atelectasis or infiltrate right greater than left. No pulmonary edema. Electronically Signed   By: Lahoma Crocker M.D.   On: 11/26/2015 09:48    Assessment/Plan: Diagnosis: Debilitation/multi-medical Labs and images independently reviewed.  Records reviewed and summated above.  1. Does the need for close, 24 hr/day medical supervision in concert  with the patient's rehab needs make it unreasonable for this patient to be served in a less intensive setting? Yes  2. Co-Morbidities requiring supervision/potential complications: polysubstance abuse (cont to counsel), bipolar disorder (cont meds, ensure mood does not limit functional progress), Hx of ventilatory (cont to monitor O2 sats and RR with increased physical activity), heart block/STEMI (cont meds, monitor HR with physical exertion), A. fib with RVR (Cont meds), systolic CHF (Monitor in accordance with increased physical activity and avoid UE resistance excercises), AKI (avoid nephrotoxic meds), dysphagia (the SLP, consider vital stem), tachypnea (monitor RR and O2 Sats with increased physical exertion), Tachycardia (monitor in accordance with pain and increasing activity), hypokalemia (continue to monitor and replete as necessary), leukocytosis (cont to monitor for signs and symptoms of infection, further workup if indicated), ABLA (transfuse if necessary to ensure appropriate perfusion for increased activity tolerance) 3. Due to bladder management, safety, skin/wound care, disease management, medication administration and patient education, does the patient require 24 hr/day rehab nursing? Yes 4. Does the patient require coordinated care of a physician, rehab nurse, PT (1-2 hrs/day, 5 days/week), OT (1-2 hrs/day, 5 days/week) and SLP (1-2 hrs/day, 5  days/week) to address physical and functional deficits in the context of the above medical diagnosis(es)? Yes Addressing deficits in the following areas: balance, endurance, locomotion, strength, transferring, bathing, dressing, feeding, toileting, speech, swallowing and psychosocial support 5. Can the patient actively participate in an intensive therapy program of at least 3 hrs of therapy per day at least 5 days per week? Potentially 6. The potential for patient to make measurable gains while on inpatient rehab is excellent 7. Anticipated  functional outcomes upon discharge from inpatient rehab are supervision and min assist  with PT, supervision and min assist with OT, independent and modified independent with SLP. 8. Estimated rehab length of stay to reach the above functional goals is: 12-15 days. 9. Does the patient have adequate social supports and living environment to accommodate these discharge functional goals? Yes 10. Anticipated D/C setting: Home 11. Anticipated post D/C treatments: HH therapy and Home excercise program 12. Overall Rehab/Functional Prognosis: good  RECOMMENDATIONS: This patient's condition is appropriate for continued rehabilitative care in the following setting: Recommend CIR once medically stable. Patient has agreed to participate in recommended program. Yes Note that insurance prior authorization may be required for reimbursement for recommended care.  Comment: Rehab Admissions Coordinator to follow up.  Delice Lesch, MD 11/27/2015

## 2015-11-27 NOTE — Care Management Important Message (Signed)
Important Message  Patient Details  Name: STEVEY DEMARY MRN: [id number] Date of Birth: 24-Jul-1951   Medicare Important Message Given:  Yes    Loann Quill 11/27/2015, 12:41 PM

## 2015-11-27 NOTE — Progress Notes (Addendum)
Nutrition Consult/Follow Up  DOCUMENTATION CODES:   Non-severe (moderate) malnutrition in context of chronic illness  INTERVENTION:   Boost Breeze po TID, each supplement provides 250 kcal and 9 grams of protein  NUTRITION DIAGNOSIS:   Increased nutrient needs related to wound healing (malnutrition) as evidenced by estimated needs, ongoing  GOAL:   Patient will meet greater than or equal to 90% of their needs, progressing  MONITOR:   PO intake, Supplement acceptance, Labs, Weight trends, Skin, I & O's  ASSESSMENT:   65 y/o male PMHx Substance abuse, depression/anxiety, Bipolar disorder, SBR for incarcerated femoral hernia and perforation. Presented with 3 day hx of N/V/C fever chills, abdominal pain. CT of abdomen shows extensive SB pneumatosis, mesenteric, portal venous gas. Taken emergently to OR for ex lap. No evidence of necrotic bowel, though abdomen left open at this time.  3/19: Exploratory laparotomy open abdomen, abdominal wound vac change, small bowel resection left in discontinuity given pressure issues 3/20: Temporary pacemaker insertion 3/21: Exploratory laparotomy, small bowel anastomosis and abdominal closure 3/24: TF started with plans to advance slowly  RD re-consulted 3/26 to advance TF to goal rate.  Patient extubated and TF (Vital AF 1.2 formula) discontinued. Patient advanced to Clear Liquid diet. CCM MD speaking with patient upon room visit. Will add oral nutrition supplements to help meet kcal, protein needs.  Diet Order:  Diet clear liquid Room service appropriate?: Yes; Fluid consistency:: Thin  Skin:  Wound (see comment) (Stage II buttokcs, closed abdominal incision)  Last BM:  3/23  Height:   Ht Readings from Last 1 Encounters:  11/17/15 5\' 10"  (1.778 m)    Weight:   Wt Readings from Last 1 Encounters:  11/27/15 172 lb 13.5 oz (78.4 kg)    Ideal Body Weight:  75.45 kg  BMI:  Body mass index is 24.8 kg/(m^2).  Estimated Nutritional  Needs:   Kcal:  2000-2200  Protein:  120-130 gm  Fluid:  per MD  EDUCATION NEEDS:   No education needs identified at this time  Arthur Holms, RD, LDN Pager #: [phone] After-Hours Pager #: [phone]

## 2015-11-27 NOTE — Progress Notes (Signed)
[facility] ADULT ICU REPLACEMENT PROTOCOL FOR AM LAB REPLACEMENT ONLY    The patient does apply for the [facility] Adult ICU Electrolyte Replacment Protocol based on the criteria listed below:   1. Is GFR >/= 40 ml/min? Yes.    Patient's GFR today is >60 2. Is urine output >/= 0.5 ml/kg/hr for the last 6 hours? Yes.   Patient's UOP is 0.7 ml/kg/hr 3. Is BUN < 60 mg/dL? Yes.    Patient's BUN today is 15 4. Abnormal electrolyte(s):K 3.2, Mag 1.7 5. Ordered repletion with: per protocol 6. If a panic level lab has been reported, has the CCM MD in charge been notified? No..   Physician:    Ronda Fairly A 11/27/2015 5:19 AM

## 2015-11-27 NOTE — Progress Notes (Signed)
Inpatient Rehabilitation  Patient was screened by Deane Melick for appropriateness for an Inpatient Acute Rehab consult.  At this time, we are recommending Inpatient Rehab consult.  Please order consult if you are agreeable.  Suraj Ramdass PT Inpatient Rehab Admissions Coordinator Cell [phone] Office [phone]    

## 2015-11-27 NOTE — Progress Notes (Signed)
PULMONARY / CRITICAL CARE MEDICINE   Name: Shawn [name] MRN: [id number] DOB: 1951/07/15    ADMISSION DATE:  11/17/2015 CONSULTATION DATE:  003/17/2017  REFERRING MD:  Dr. Redmond Pulling with [facility]  CHIEF COMPLAINT:  Postoperative ventilator management, sepsis management  65 year old male with a PMH of ETOH, tobacco abuse and bipolar disorder who presented to the emergency department on 3/17 with a 3 day hx of abdominal pain, nausea, vomiting and constipation. Found to have CT concerning for diffuse abdominal pneumatosis and elevated lactic acid.  Patient was taken to OR for ex-lap, found to have viable gut, diffuse pneumatosis (no exact cause) & no evidence of dead bowel. He was left open and taken to the MICU.  He developed CHB on 3/18 and MI.  Later 3/19, was taken to OR for wound closure but developed CHB in OR and VAC was applied.  Subsequently taken to cath lab 3/20 for temporary pacemaker insertion.    STUDIES:  3/17  CT abdomen and pelvis >> diffuse extensive small bowel pneumatosis with mild dilation throughout the abdomen and pelvisconsistent with small bowel ischemia. Diffuse central Mesentericand portal venous small gas, Associated diffuse small bowel ileus, extensive abdominal and aortic atherosclerosis, no significant pneumoperitoneum or abscess, cholelithiasis, colonic diverticulosis Port CXR 3/27:  Personally reviewed by me. Right basilar opacity has essentially resolved. Left basilar opacity unchanged. CVL in good position.   MICROBIOLOGY: Trach Asp Ctx 3/23:  Klebsiella pneumoniae  HIV 3/22:  Negative Blood Ctx x2 3/19:  Negative   Trach Asp Ctx 3/18:  Klebsiella pneumoniae  Urine Ctx 3/18:  Negative Blood Ctx 3/17:  Negative MRSA PCR 3/17:  Negative   ANTIBIOTICS: Zosyn 3/17 - 3/26 Vancomycin 3/17 - 3/24 Eraxis 3/17 - 3/27  LINES/TUBES: R IJ TLC 3/17 >>  Foley 3/17>>> PIV x1  SIGNIFICANT EVENTS: 3/17 Admission, exploratory laparotomy 3/18 Complete  heart block 3/18 MI - EF 45% on echo 3/19 OR for wound closure 3/20 Cath lab for temp pacemaker  3/20 -   RT reports pt returned from cath lab after temporary pacemaker insertion to holding, pending transfer to ICU.  Pt remains febrile to 101, requiring 12 mcg levophed  11/21/15 - EP reports acc jnl but has cleared patient for surgery. To OR today per CCCS notes for reanastamosis.  K given earlier today. Mag at 1.7. Curently on levophed gtt and fent gtt. Improving AKI.   11/22/15 - s/o small bowel anast and abd closure yesterday . Cards wants to diurese once bp improves. Off pressors this AM but has low grade temps/ CCS ok with heparin resumption and extubation when appropriate. No further OR trips planned.  Per RN - on fent gtt -> restless and not followng commands  11/23/15 - pacer dc'ed by cards yesterday. CCS holding off tube feds for now.  possible SVT HR 130.   SUBJECTIVE:  Patient off Precedex since 5pm 3/25 & Levophed since 3am 3/26. Patient reports he is having some flatulence. Mild abdominal pain & nausea. No chest pain or pressure.   REVIEW OF SYSTEMS:  No subjective fever or chills. No dyspnea or cough.  VITAL SIGNS: BP 119/68 mmHg  Pulse 76  Temp(Src) 98 F (36.7 C) (Oral)  Resp 18  Ht 5\' 10"  (1.778 m)  Wt 78.4 kg (172 lb 13.5 oz)  BMI 24.80 kg/m2  SpO2 98%  HEMODYNAMICS:    VENTILATOR SETTINGS: Vent Mode:  [-]  FiO2 (%):  [40 %] 40 %  INTAKE / OUTPUT: I/O last 3  completed shifts: In: 1597.4 [I.V.:707.4; NG/GT:390; IV Piggyback:500] Out: 6100 [Urine:6100]  PHYSICAL EXAMINATION: General:  No distress. Awake. Sitting up in bed. Neuro:  Grossly nonfocal. Moving all 4 extremities equally. HEENT:  Moist mucus membranes. No scleral injection or icterus. Cardiovascular:  Regular rate & rhythm. No appreciable JVD. Lungs: Normal work of breathing on room air. Slightly diminished breath sounds in bases bilaterally. Abdomen:  Soft. Nondistended. Bowel sounds  present. Integument:  Warm & dry. No rash on exposed skin.   LABS:  PULMONARY  Recent Labs Lab 11/23/15 0143 11/25/15 1018  PHART 7.413 7.478*  PCO2ART 43.6 35.2  PO2ART 79.0* 88.0  HCO3 27.7* 26.1*  TCO2 29 27  O2SAT 95.0 97.0    CBC  Recent Labs Lab 11/25/15 0430 11/26/15 0400 11/27/15 0341  HGB 9.3* 9.4* 8.5*  HCT 27.6* 28.1* 26.2*  WBC 12.0* 11.7* 13.4*  PLT 302 364 457*    COAGULATION  Recent Labs Lab 11/23/15 1042  INR 1.45    CARDIAC    Recent Labs Lab 11/23/15 1042 11/23/15 1708 11/24/15 0320  TROPONINI 0.14* 0.06* 0.06*   No results for input(s): PROBNP in the last 168 hours.   CHEMISTRY  Recent Labs Lab 11/22/15 0538 11/23/15 0240 11/23/15 0319 11/23/15 1042 11/24/15 0320 11/25/15 0430 11/26/15 0400 11/27/15 0341  NA 141 141 142 143 141 139 141 143  K 3.9 4.0 4.3 3.7 3.0* 3.3* 2.8* 3.2*  CL 111 107 106 110 105 107 102 103  CO2 22 25 23 24 28 24 28 30   GLUCOSE 127* 111* 104* 114* 129* 128* 141* 116*  BUN 14 19 19  21* 18 15 14 15   CREATININE 1.38* 1.82* 1.73* 1.80* 1.54* 1.15 1.22 1.04  CALCIUM 7.0* 7.1* 7.0* 6.8* 7.1* 7.2* 7.6* 7.8*  MG 1.7 1.9 1.8  --  2.0 1.9  --  1.7  PHOS 2.0*  --  3.6  --  2.3* 2.6  --  3.0   Estimated Creatinine Clearance: 71.2 mL/min (by C-G formula based on Cr of 1.04).   LIVER  Recent Labs Lab 11/21/15 0417 11/23/15 1042 11/26/15 0400  AST 27 29 49*  ALT 17 19 26   ALKPHOS 137* 162* 267*  BILITOT 1.5* 1.6* 1.3*  PROT 3.8* 3.8* 4.5*  ALBUMIN 1.3* 1.2* 1.4*  INR  --  1.45  --      INFECTIOUS  Recent Labs Lab 11/23/15 1031 11/23/15 1045 11/24/15 0320 11/25/15 0430  LATICACIDVEN 1.3  --   --   --   PROCALCITON  --  3.34 2.04 0.88     ENDOCRINE CBG (last 3)   Recent Labs  11/27/15 0019 11/27/15 0345 11/27/15 0839  GLUCAP 111* 110* 86         IMAGING x48h  - image(s) personally visualized  -   highlighted in bold Dg Chest Port 1 View  11/27/2015  CLINICAL DATA:   Pulmonary edema EXAM: PORTABLE CHEST 1 VIEW COMPARISON:  November 26, 2015 FINDINGS: Endotracheal tube and nasogastric tube have been removed. Central catheter tip is in the superior vena cava. No pneumothorax. There is persistent consolidation in the left base with small left effusion. There has been interval clearing of airspace opacity from the right base. Currently there is a slight degree of underlying interstitial pulmonary edema. Heart is upper normal in size with pulmonary vascularity within normal limits. IMPRESSION: Trace interstitial edema. Airspace consolidation left base with small left effusion. Suspect a degree of pneumonia in the left base region. Airspace opacity from  the right base has cleared. No change in cardiac silhouette. Electronically Signed   By: Lowella Grip III M.D.   On: 11/27/2015 07:37   Dg Chest Port 1 View  11/26/2015  CLINICAL DATA:  Pulmonary edema EXAM: PORTABLE CHEST 1 VIEW COMPARISON:  11/24/2015 FINDINGS: Cardiomediastinal silhouette is stable. Endotracheal tube in place with tip 3.9 cm above the carina. Stable right IJ central line with tip in SVC. Stable NG tube position. No pulmonary edema. Persistent hazy bilateral basilar atelectasis or infiltrate right greater than left. Slight improvement in aeration in upper lungs. IMPRESSION: Stable support apparatus. Slight improvement in aeration in upper lungs. Persistent bilateral basilar hazy atelectasis or infiltrate right greater than left. No pulmonary edema. Electronically Signed   By: Lahoma Crocker M.D.   On: 11/26/2015 09:48    ASSESSMENT / PLAN:  65 year old male with bowel injury & pneumatosis s/p exploratory laparotomy. Patient had no bowel resection. Patient was in septic shock that has resolved as well as acute hypoxic respiratory failure. Patient has weaned off vasopressors over 24 hours ago and continues to clinically improve. He did have 3rd degree AV block as well as atrial fibrillation that was addressed by  Cardiology. Plan to establish additional IV access and D/C his central line.   1. Septic Shock:  Shock has resolved. Continuing to follow blood cultures to completion. Finishing 10 day course of Eraxis. 2. Acute Hypoxic Respiratory Failure:  Continuing to improve. Off oxygen now at rest. Continue OOB & Pulmonary Toilette with IS.  3. Klebsiella Pneumonia:  S/P Treatment. Plan to reculture for any fever. 4. Small Bowel Pneumatosis:  No ischemia. S/P Ex Lap & reanatamosis. Surgery following.  5. Complete Heart Block/A Fib:  Cardiology consulted. Last seen 3/24. Had bradycardia on IV Amiodarone on 3/25 & drip was discontinued. Start Amiodarone 400mg  po bid per their recommendations. ASA 81mg  daily. 6. Acute on Chronic Systolic CHF Exacerbation:  Continuing Lasix PO daily. Monitoring UOP.  7. Hypokalemia:  Likely due to diuresis. KCl 2mEq IV x4 runs. Repeat electrolytes in AM. 8. Anemia:  No signs of active bleeding. Continuing to monitor cell counts daily w/ CBC. 9. Leukocytosis:  Likely secondary to steroids. Trending cell counts daily w/ CBC. 10. Adrenal Insufficiency:  Weaning Solu-Cortef to 50mg  IV q12hr. 11. Hyperglycemia:  Continuing accu-checks q4hr until tolerating substantial diet. SSI per algorithm. 12.  Diet:  Starting clear liquid diet as tolerated. 13. Prophylaxis:  Protonix PO. SCDs & Heparin [place] q8hr. 14. Disposition:  Transfer patient to telemetry bed. TRH to assume care & PCCM will sign off as of 3/28.   Sonia Baller Ashok Cordia, M.D. [facility] Pager:  [phone] After 3pm or if no response, call [phone] 9:48 AM 11/27/2015

## 2015-11-27 NOTE — Evaluation (Signed)
Physical Therapy Evaluation Patient Details Name: Shawn [name] MRN: [id number] DOB: [birth year] Today's Date: 11/27/2015   History of Present Illness  65 year old male with a PMH of ETOH, tobacco abuse and bipolar disorder who presented to the emergency department on 3/17 with a 3 day hx of abdominal pain, nausea, vomiting and constipation. Patient was taken to OR for ex-lap, found to have viable gut, diffuse pneumatosis (no exact cause) & no evidence of dead bowel. He was left open and taken to the MICU. He developed CHB on 3/18 and MI. Later 3/19, was taken to OR for wound closure but developed CHB in OR and VAC was applied. Subsequently taken to cath lab 3/20 for temporary pacemaker insertion.  Went back to OR for small bowel anast and abd closure on 3/21 and pacer d/c 3/22.  Was extubated 3/26.   Clinical Impression  Patient presents with decreased mobility due to deficits listed in PT problem list.  He will benefit from skilled PT in the acute setting to allow return home with family assist following CIR level rehab for multisystem deficits requiring interdisciplinary skilled inpatient care prior to d/c home.    Follow Up Recommendations CIR    Equipment Recommendations  Rolling walker with 5" wheels;3in1 (PT)    Recommendations for Other Services       Precautions / Restrictions Precautions Precautions: Fall Precaution Comments: watch HR      Mobility  Bed Mobility               General bed mobility comments: just up to chair with nursing  Transfers Overall transfer level: Needs assistance Equipment used: Rolling walker (2 wheeled) Transfers: Sit to/from Stand Sit to Stand: +2 physical assistance;Mod assist         General transfer comment: stood first attempt wtih +1 assist and difficulty getting upright due to anxiety, L LE weakness and poor tolerance with HR up to 123.  Second attempt up with nursing assist and mod +2 A with cues for hand placement esp on L  when pt reaching for bed with L UE due to L LE weakness  Ambulation/Gait             General Gait Details: unable  Stairs            Wheelchair Mobility    Modified Rankin (Stroke Patients Only)       Balance Overall balance assessment: Needs assistance Sitting-balance support: Feet supported Sitting balance-Leahy Scale: Fair Sitting balance - Comments: sitting edge of chair   Standing balance support: Bilateral upper extremity supported Standing balance-Leahy Scale: Zero Standing balance comment: once able to get upright with cues and +2 A and bilat UE support on walker, could not maintain long due to "dizziness" and weakness                             Pertinent Vitals/Pain Pain Assessment: 0-10 Pain Score: 6  Pain Location: abdomen Pain Descriptors / Indicators: Operative site guarding;Sore Pain Intervention(s): Limited activity within patient's tolerance;Monitored during session;Repositioned    Home Living Family/patient expects to be discharged to:: Inpatient rehab Living Arrangements: Other relatives Available Help at Discharge: Family;Available 24 hours/day Type of Home: House Home Access: Ramped entrance     Home Layout: One level Home Equipment: None Additional Comments: lives alone, but plans to stay with his sister who is retired    Prior Function Level of Independence: Independent  Hand Dominance        Extremity/Trunk Assessment   Upper Extremity Assessment: Generalized weakness           Lower Extremity Assessment: LLE deficits/detail   LLE Deficits / Details: h/o surgery with multiple scars, noted leg length discrepancy (pt reports doesn't wear built up shoe); AAROM grossly limited knee flexion to about 80, strength hip flexion 3+/5, knee extension 3-/5     Communication   Communication: No difficulties  Cognition Arousal/Alertness: Awake/alert Behavior During Therapy: Anxious Overall  Cognitive Status: No family/caregiver present to determine baseline cognitive functioning                      General Comments      Exercises General Exercises - Lower Extremity Ankle Circles/Pumps: AROM;Both;5 reps;Seated Quad Sets: AROM;Left;5 reps;Seated      Assessment/Plan    PT Assessment Patient needs continued PT services  PT Diagnosis Generalized weakness;Difficulty walking   PT Problem List Decreased strength;Decreased activity tolerance;Decreased balance;Decreased mobility;Cardiopulmonary status limiting activity;Decreased knowledge of use of DME;Decreased safety awareness  PT Treatment Interventions DME instruction;Balance training;Gait training;Functional mobility training;Patient/family education;Therapeutic activities;Therapeutic exercise   PT Goals (Current goals can be found in the Care Plan section) Acute Rehab PT Goals Patient Stated Goal: To go to rehab PT Goal Formulation: With patient Time For Goal Achievement: 12/04/15 Potential to Achieve Goals: Good    Frequency Min 3X/week   Barriers to discharge        Co-evaluation               End of Session Equipment Utilized During Treatment: Gait belt Activity Tolerance: Patient limited by fatigue;Treatment limited secondary to medical complications (Comment) (elevated HR) Patient left: in chair;with call bell/phone within reach;with chair alarm set           Time: [phone] PT Time Calculation (min) (ACUTE ONLY): 18 min   Charges:   PT Evaluation $PT Eval Moderate Complexity: 1 Procedure     PT G CodesReginia Naas 2015/12/21, 9:40 AM Magda Kiel, [place] 12-21-15

## 2015-11-27 NOTE — Progress Notes (Signed)
CCS/Shawn [name] Progress Note 6 Days Post-Op  Subjective: Patient looks excellent this morning.  Very conversant.  Minimal complaints  Objective: Vital signs in last 24 hours: Temp:  [97.8 F (36.6 C)-98.4 F (36.9 C)] 97.8 F (36.6 C) (03/27 0346) Pulse Rate:  [53-81] 76 (03/27 0700) Resp:  [14-21] 18 (03/27 0700) BP: (106-123)/(54-68) 122/64 mmHg (03/27 0700) SpO2:  [90 %-100 %] 97 % (03/27 0700) FiO2 (%):  [40 %] 40 % (03/26 1108) Weight:  [78.4 kg (172 lb 13.5 oz)] 78.4 kg (172 lb 13.5 oz) (03/27 0500) Last BM Date: 11/23/15  Intake/Output from previous day: 03/26 0701 - 03/27 0700 In: 590 [I.V.:120; NG/GT:120; IV Piggyback:350] Out: 3900 [Urine:3900] Intake/Output this shift: Total I/O In: 10 [I.V.:10] Out: -   General: No acute distress and ready to eat.  Lungs: Clear to auscultation.  Abd: Soft, great bowel sounds.  Has had flatus but no bowel movement.  Extremities: No changes.  Neuro: Intact.  Reported to have been confused earlier.  Lab Results:  @LABLAST2 (wbc:2,hgb:2,hct:2,plt:2) BMET ) Recent Labs  11/26/15 0400 11/27/15 0341  NA 141 143  K 2.8* 3.2*  CL 102 103  CO2 28 30  GLUCOSE 141* 116*  BUN 14 15  CREATININE 1.22 1.04  CALCIUM 7.6* 7.8*   PT/INR No results for input(s): LABPROT, INR in the last 72 hours. ABG  Recent Labs  11/25/15 1018  PHART 7.478*  HCO3 26.1*    Studies/Results: Dg Chest Port 1 View  11/27/2015  CLINICAL DATA:  Pulmonary edema EXAM: PORTABLE CHEST 1 VIEW COMPARISON:  November 26, 2015 FINDINGS: Endotracheal tube and nasogastric tube have been removed. Central catheter tip is in the superior vena cava. No pneumothorax. There is persistent consolidation in the left base with small left effusion. There has been interval clearing of airspace opacity from the right base. Currently there is a slight degree of underlying interstitial pulmonary edema. Heart is upper normal in size with pulmonary vascularity within normal limits.  IMPRESSION: Trace interstitial edema. Airspace consolidation left base with small left effusion. Suspect a degree of pneumonia in the left base region. Airspace opacity from the right base has cleared. No change in cardiac silhouette. Electronically Signed   By: Lowella Grip III M.D.   On: 11/27/2015 07:37   Dg Chest Port 1 View  11/26/2015  CLINICAL DATA:  Pulmonary edema EXAM: PORTABLE CHEST 1 VIEW COMPARISON:  11/24/2015 FINDINGS: Cardiomediastinal silhouette is stable. Endotracheal tube in place with tip 3.9 cm above the carina. Stable right IJ central line with tip in SVC. Stable NG tube position. No pulmonary edema. Persistent hazy bilateral basilar atelectasis or infiltrate right greater than left. Slight improvement in aeration in upper lungs. IMPRESSION: Stable support apparatus. Slight improvement in aeration in upper lungs. Persistent bilateral basilar hazy atelectasis or infiltrate right greater than left. No pulmonary edema. Electronically Signed   By: Lahoma Crocker M.D.   On: 11/26/2015 09:48    Anti-infectives: Anti-infectives    Start     Dose/Rate Route Frequency Ordered Stop   11/21/15 1400  piperacillin-tazobactam (ZOSYN) IVPB 3.375 g  Status:  Discontinued     3.375 g 12.5 mL/hr over 240 Minutes Intravenous 3 times per day 11/21/15 1205 11/26/15 1325   11/19/15 2000  vancomycin (VANCOCIN) IVPB 1000 mg/200 mL premix  Status:  Discontinued     1,000 mg 200 mL/hr over 60 Minutes Intravenous Every 24 hours 11/19/15 1857 11/24/15 1051   11/18/15 1800  anidulafungin (ERAXIS) 100 mg in sodium chloride  0.9 % 100 mL IVPB     100 mg over 90 Minutes Intravenous Every 24 hours 11/17/15 1812 11/27/15 2359   11/17/15 2200  piperacillin-tazobactam (ZOSYN) IVPB 2.25 g  Status:  Discontinued     2.25 g 100 mL/hr over 30 Minutes Intravenous 3 times per day 11/17/15 1930 11/21/15 1205   11/17/15 1830  anidulafungin (ERAXIS) 200 mg in sodium chloride 0.9 % 200 mL IVPB     200 mg over 180  Minutes Intravenous  Once 11/17/15 1812 11/17/15 2203   11/17/15 1815  vancomycin (VANCOCIN) 1,500 mg in sodium chloride 0.9 % 500 mL IVPB     1,500 mg 250 mL/hr over 120 Minutes Intravenous  Once 11/17/15 1813 11/17/15 2103   11/17/15 1530  piperacillin-tazobactam (ZOSYN) IVPB 3.375 g     3.375 g 12.5 mL/hr over 240 Minutes Intravenous NOW 11/17/15 1521 11/17/15 1942   11/17/15 1515  piperacillin-tazobactam (ZOSYN) IVPB 4.5 g  Status:  Discontinued     4.5 g 200 mL/hr over 30 Minutes Intravenous  Once 11/17/15 1505 11/18/15 0040      Assessment/Plan: s/p Procedure(s): SMALL BOWEL REPAIR, SMALL BOWEL ANATAMOSIS AND ABDOMINAL CLOSURE d/c foley Advance diet Transfer from the ICU.  LOS: 10 days   Kathryne Eriksson. Dahlia Bailiff, MD, FACS [phone] [facility] 11/27/2015

## 2015-11-28 ENCOUNTER — Encounter: Payer: Self-pay | Admitting: Family Medicine

## 2015-11-28 LAB — CULTURE, BLOOD (ROUTINE X 2)
CULTURE: NO GROWTH
Culture: NO GROWTH

## 2015-11-28 LAB — RENAL FUNCTION PANEL
Albumin: 1.7 g/dL — ABNORMAL LOW (ref 3.5–5.0)
Anion gap: 9 (ref 5–15)
BUN: 14 mg/dL (ref 6–20)
CHLORIDE: 105 mmol/L (ref 101–111)
CO2: 30 mmol/L (ref 22–32)
CREATININE: 0.91 mg/dL (ref 0.61–1.24)
Calcium: 7.9 mg/dL — ABNORMAL LOW (ref 8.9–10.3)
GFR calc non Af Amer: >60 mL/min (ref >60)
GLUCOSE: 104 mg/dL — AB (ref 65–99)
Phosphorus: 2.1 mg/dL — ABNORMAL LOW (ref 2.5–4.6)
Potassium: 2.8 mmol/L — ABNORMAL LOW (ref 3.5–5.1)
Sodium: 144 mmol/L (ref 135–145)

## 2015-11-28 LAB — CBC WITH DIFFERENTIAL/PLATELET
Basophils Absolute: 0 10*3/uL (ref 0.0–0.1)
Basophils Relative: 0 %
EOS ABS: 0 10*3/uL (ref 0.0–0.7)
Eosinophils Relative: 0 %
HEMATOCRIT: 29.3 % — AB (ref 39.0–52.0)
HEMOGLOBIN: 9.4 g/dL — AB (ref 13.0–17.0)
LYMPHS ABS: 0.9 10*3/uL (ref 0.7–4.0)
LYMPHS PCT: 8 %
MCH: 29.2 pg (ref 26.0–34.0)
MCHC: 32.1 g/dL (ref 30.0–36.0)
MCV: 91 fL (ref 78.0–100.0)
MONOS PCT: 4 %
Monocytes Absolute: 0.4 10*3/uL (ref 0.1–1.0)
NEUTROS ABS: 9.3 10*3/uL — AB (ref 1.7–7.7)
NEUTROS PCT: 88 %
PLATELETS: 613 10*3/uL — AB (ref 150–400)
RBC: 3.22 MIL/uL — ABNORMAL LOW (ref 4.22–5.81)
RDW: 14.5 % (ref 11.5–15.5)
WBC: 10.6 10*3/uL — ABNORMAL HIGH (ref 4.0–10.5)

## 2015-11-28 LAB — MAGNESIUM: Magnesium: 2 mg/dL (ref 1.7–2.4)

## 2015-11-28 LAB — GLUCOSE, CAPILLARY
GLUCOSE-CAPILLARY: 86 mg/dL (ref 65–99)
GLUCOSE-CAPILLARY: 98 mg/dL (ref 65–99)
GLUCOSE-CAPILLARY: 89 mg/dL (ref 65–99)
Glucose-Capillary: 110 mg/dL — ABNORMAL HIGH (ref 65–99)
Glucose-Capillary: 114 mg/dL — ABNORMAL HIGH (ref 65–99)
Glucose-Capillary: 89 mg/dL (ref 65–99)

## 2015-11-28 MED ORDER — POTASSIUM CHLORIDE 10 MEQ/100ML IV SOLN
10.0000 meq | INTRAVENOUS | Status: AC
  Administered 2015-11-28 (×4): 10 meq via INTRAVENOUS
  Filled 2015-11-28 (×3): qty 100

## 2015-11-28 MED ORDER — POTASSIUM CHLORIDE CRYS ER 20 MEQ PO TBCR
40.0000 meq | EXTENDED_RELEASE_TABLET | Freq: Two times a day (BID) | ORAL | Status: DC
  Administered 2015-11-28 (×2): 40 meq via ORAL
  Filled 2015-11-28 (×2): qty 2

## 2015-11-28 MED ORDER — POTASSIUM CHLORIDE 10 MEQ/100ML IV SOLN
INTRAVENOUS | Status: AC
  Filled 2015-11-28: qty 100

## 2015-11-28 MED ORDER — HYDROCORTISONE NA SUCCINATE PF 100 MG IJ SOLR
50.0000 mg | Freq: Every day | INTRAMUSCULAR | Status: AC
  Administered 2015-11-29: 50 mg via INTRAVENOUS
  Filled 2015-11-28: qty 2

## 2015-11-28 MED ORDER — QUETIAPINE FUMARATE 25 MG PO TABS
50.0000 mg | ORAL_TABLET | Freq: Every day | ORAL | Status: DC
  Administered 2015-11-28: 50 mg via ORAL
  Filled 2015-11-28: qty 2

## 2015-11-28 MED ORDER — LAMOTRIGINE 100 MG PO TABS
200.0000 mg | ORAL_TABLET | Freq: Every day | ORAL | Status: DC
  Administered 2015-11-28: 200 mg via ORAL
  Filled 2015-11-28: qty 2
  Filled 2015-11-28: qty 1

## 2015-11-28 MED ORDER — QUETIAPINE FUMARATE 25 MG PO TABS: 100.0000 mg | ORAL_TABLET | Freq: Every day | ORAL | Status: DC

## 2015-11-28 NOTE — H&P (Signed)
Physical Medicine and Rehabilitation Admission H&P    Chief Complaint  Patient presents with  . Hypotension  . Hematemesis  : HPI: Shawn [name] is a 65 y.o. right handed male with history of polysubstance abuse alcohol abuse, bipolar disorder, chronic systolic congestive heart failure. Patient lives alone but plans to stay with his sister who is retired. Patient independent prior to admission. One level home. Presented 11/17/2015 with 3 day history of abdominal pain with nausea and vomiting as well as constipation with fever and chills. Patient noted history of small bowel resection in the past for incarcerated femoral hernia perforation in 2013 and again in 2016. Findings of lactic acid of 12, serum creatinine 6.7. CT of abdomen and pelvis with extensive small bowel pneumatosis, mesenteric and portal venous gas. Underwent exploratory laparotomy 11/17/2015 and found to have viable gut no evidence of ischemic bowel. Wound was left open until 11/21/2015 and underwent closureWith postoperative ventilatory management. Hospital course cardiology services consulted 11/18/2015 for findings of complete heart block/STEMI with heart rate in the 30s developed A. fib with RVR. Echocardiogram with ejection fraction of 45%. Severe inferior lateral hypokinesis to akinesis. Patient did receive a temporary pacemaker for short time. Placed on intravenous amiodarone. Attempted cardioversion failed. Patient extubated 11/26/2015. Creatinine has improved to 1.04.Hypokalemia 2.8-3.2. Potassium supplement added. Acute blood loss anemia 9.4 and monitored.Completed a course of antibiotics for Klebsiella pneumonia. Subcutaneous heparin later added for DVT prophylaxis. Diet advanced to soft..Patient was admitted for a comprehensive rehabilitation program  ROS  HENT: Negative for hearing loss.  Eyes: Negative for blurred vision and double vision.  Respiratory: Positive for shortness of breath. Negative for cough.    Cardiovascular: Negative for chest pain.  Gastrointestinal: Positive for nausea, abdominal pain and constipation.  Genitourinary: Negative for dysuria and hematuria.  Musculoskeletal: Positive for myalgias and joint pain.  Skin: Negative for rash.  Neurological: Positive for weakness. Negative for seizures, loss of consciousness and headaches.  All other systems reviewed and are negative   Past Medical History  Diagnosis Date  . Osteoarthritis X years    left knee.  Distant hx (age 51) "dislocated" knee and required surgery, then crush injury to patella required knee cap removal.  . Diverticular disease     "itis" x 2 episodes (last was about 2007)  . Tobacco dependence   . Multiple rib fractures 10/09/10    s/p fall down a ravine--9th and 10th on right, contusion of left 7th and 8th ribs  . Fracture of metatarsal bone(s), closed     3rd, 4th, 5th mid shaft on left foot  . History of substance abuse     cocaine, marijuana, acid, alcohol (in remission since 2010)  . Solar dermatitis     face and ears  . Bipolar disorder [facility])     Has had multiple psych hospitalization in the past, most recently at [place] [facility] 01/31/11-02/04/11.  Says meds made him emotionally blunted. (lamictal and wellbutrin).  . Alcoholism ([place])     "Dry" since 2002; relapse 2014  . Depression   . Anxiety   . Vitamin B12 deficiency 10/2013    IF ab positive.  Replacement therapy started end of March 2015   Past Surgical History  Procedure Laterality Date  . Knee dislocation surgery  1966  . Patellectomy  1968    s/p crush injury  . Inguinal hernia repair      left  . Hernia repair      at 65 years  of age  65  . Total knee arthroplasty  11/20/2011    Procedure: TOTAL KNEE ARTHROPLASTY;  Surgeon: Loreta Ave, MD;  Location: [facility] OR;  Service: Orthopedics;  Laterality: Left;  DR Eulah Pont WANTS FOR THIS CASE  . Inguinal hernia repair  06/15/2012    Procedure: HERNIA REPAIR INGUINAL  INCARCERATED;  Surgeon: Mariella Saa, MD;  Location: [facility] ORS;  Service: General;  Laterality: Right;  . Bowel resection  06/15/2012    Procedure: SMALL BOWEL RESECTION;  Surgeon: Mariella Saa, MD;  Location: [facility] ORS;  Service: General;  Laterality: N/A;  . Femur im nail  08/06/2012    Procedure: INTRAMEDULLARY (IM) NAIL FEMORAL;  Surgeon: Javier Docker, MD;  Location: [facility] ORS;  Service: Orthopedics;  Laterality: Left;  . Laparotomy N/A 11/17/2015    Procedure: EXPLORATORY LAPAROTOMY;  Surgeon: Manus Rudd, MD;  Location: [facility] OR;  Service: General;  Laterality: N/A;  . Laparotomy N/A 11/19/2015    Procedure: EXPLORATORY LAPAROTOMY OPEN ABDOMEN ABDOMINAL WOUND VAC CHANGE;  Surgeon: Axel Filler, MD;  Location: [facility] OR;  Service: General;  Laterality: N/A;  . Bowel resection  11/19/2015    Procedure: SMALL BOWEL RESECTION;  Surgeon: Axel Filler, MD;  Location: [facility] OR;  Service: General;;  . Cardiac catheterization N/A 11/20/2015    Procedure: Temporary Wire;  Surgeon: Marinus Maw, MD;  Location: [facility] INVASIVE CV LAB;  Service: Cardiovascular;  Laterality: N/A;  . Small bowel repair N/A 11/21/2015    Procedure: SMALL BOWEL REPAIR, SMALL BOWEL ANATAMOSIS AND ABDOMINAL CLOSURE;  Surgeon: Abigail Miyamoto, MD;  Location: [facility] OR;  Service: General;  Laterality: N/A;   Family History  Problem Relation Age of Onset  . Arthritis Mother   . Arthritis Father   . Cancer Brother     lung cancer.  Died in early 45s.   Social History:  reports that he has been smoking Cigarettes.  He has a 30 pack-year smoking history. He has never used smokeless tobacco. He reports that he drinks alcohol. He reports that he does not use illicit drugs. Allergies:  Allergies  Allergen Reactions  . Codeine Nausea Only and Other (See Comments)    flushing  . Depakote [Divalproex Sodium] Other (See Comments)    Makes me crazy   Medications Prior to Admission  Medication Sig Dispense Refill  . diclofenac  (VOLTAREN) 75 MG EC tablet Take 75 mg by mouth 2 (two) times daily.    Marland Kitchen dicyclomine (BENTYL) 10 MG capsule Take 10 mg by mouth 4 (four) times daily.    Marland Kitchen escitalopram (LEXAPRO) 10 MG tablet Take 10 mg by mouth at bedtime.    . folic acid (FOLVITE) 1 MG tablet Take 1 mg by mouth daily.    Marland Kitchen lamoTRIgine (LAMICTAL) 200 MG tablet Take 200 mg by mouth at bedtime.    . Multiple Vitamin (MULTIVITAMIN WITH MINERALS) TABS tablet Take 1 tablet by mouth daily.    . QUEtiapine (SEROQUEL) 300 MG tablet Take 300 mg by mouth at bedtime.    . traMADol-acetaminophen (ULTRACET) 37.5-325 MG tablet Take 1 tablet by mouth every 4 (four) hours as needed (pain).    . hydroxypropyl methylcellulose (ISOPTO TEARS) 2.5 % ophthalmic solution Place 2 drops into both eyes as needed for dry eyes.      Home: Home Living Family/patient expects to be discharged to:: Inpatient rehab Living Arrangements: Other relatives Available Help at Discharge: Family, Available 24 hours/day Type of Home: House Home Access: Ramped entrance Home  Layout: One level Home Equipment: None Additional Comments: lives alone, but plans to stay with his sister who is retired   Functional History: Prior Function Level of Independence: Independent  Functional Status:  Mobility: Bed Mobility General bed mobility comments: just up to chair with nursing Transfers Overall transfer level: Needs assistance Equipment used: Rolling walker (2 wheeled) Transfers: Sit to/from Stand Sit to Stand: +2 physical assistance, Mod assist General transfer comment: stood first attempt wtih +1 assist and difficulty getting upright due to anxiety, L LE weakness and poor tolerance with HR up to 123.  Second attempt up with nursing assist and mod +2 A with cues for hand placement esp on L when pt reaching for bed with L UE due to L LE weakness Ambulation/Gait General Gait Details: unable    ADL:    Cognition: Cognition Overall Cognitive Status: No  family/caregiver present to determine baseline cognitive functioning Orientation Level: Oriented to person Cognition Arousal/Alertness: Awake/alert Behavior During Therapy: Anxious Overall Cognitive Status: No family/caregiver present to determine baseline cognitive functioning  Physical Exam: Blood pressure 117/64, pulse 71, temperature 98.6 F (37 C), temperature source Oral, resp. rate 18, height '5\' 11"'$  (1.803 m), weight 76.794 kg (169 lb 4.8 oz), SpO2 94 %. Physical Exam Constitutional: He is oriented to person, place, and time. No distress.  Frail  HENT:  Head: Normocephalic.  Mouth/Throat: Oropharynx is clear and moist.  Eyes: Conjunctivae and EOM are normal.  Neck: Neck supple. No thyromegaly present.  Cardiovascular:  Cardiac rate controlled  Respiratory: Effort normal.  Decreased breath sounds at the bases  GI: Soft. Bowel sounds are normal.  Wet-to-dry dressing to abdominal wound  Musculoskeletal: He exhibits no edema or tenderness.  Neurological: He is alert and oriented to person, place, and time.  Makes good eye contact with examiner.  Follows simple commands. Sensation intact light touch Motor: Bilateral upper extremities proximally 4+/5, distally 5/5 Bilateral lower extremities: Proximally 4/5, distally 5/5  Skin: Skin is warm and dry. He is not diaphoretic.  Dressing to neck and abdomen c/d/i  Psychiatric: His behavior is normal. Thought content normal   Results for orders placed or performed during the hospital encounter of 11/17/15 (from the past 48 hour(s))  Glucose, capillary     Status: Abnormal   Collection Time: 11/26/15  7:47 AM  Result Value Ref Range   Glucose-Capillary 114 (H) 65 - 99 mg/dL   Comment 1 Capillary Specimen    Comment 2 Notify RN   Glucose, capillary     Status: Abnormal   Collection Time: 11/26/15 12:13 PM  Result Value Ref Range   Glucose-Capillary 100 (H) 65 - 99 mg/dL   Comment 1 Capillary Specimen    Comment 2 Notify RN     Glucose, capillary     Status: Abnormal   Collection Time: 11/26/15  3:41 PM  Result Value Ref Range   Glucose-Capillary 101 (H) 65 - 99 mg/dL   Comment 1 Capillary Specimen    Comment 2 Notify RN   Glucose, capillary     Status: None   Collection Time: 11/26/15  7:45 PM  Result Value Ref Range   Glucose-Capillary 98 65 - 99 mg/dL   Comment 1 Notify RN    Comment 2 Document in Chart   Glucose, capillary     Status: Abnormal   Collection Time: 11/27/15 12:19 AM  Result Value Ref Range   Glucose-Capillary 111 (H) 65 - 99 mg/dL   Comment 1 Notify RN  Comment 2 Document in Chart   Basic metabolic panel     Status: Abnormal   Collection Time: 11/27/15  3:41 AM  Result Value Ref Range   Sodium 143 135 - 145 mmol/L   Potassium 3.2 (L) 3.5 - 5.1 mmol/L   Chloride 103 101 - 111 mmol/L   CO2 30 22 - 32 mmol/L   Glucose, Bld 116 (H) 65 - 99 mg/dL   BUN 15 6 - 20 mg/dL   Creatinine, Ser 1.04 0.61 - 1.24 mg/dL   Calcium 7.8 (L) 8.9 - 10.3 mg/dL   GFR calc non Af Amer >60 >60 mL/min   GFR calc Af Amer >60 >60 mL/min    Comment: (NOTE) The eGFR has been calculated using the CKD EPI equation. This calculation has not been validated in all clinical situations. eGFR's persistently <60 mL/min signify possible Chronic Kidney Disease.    Anion gap 10 5 - 15  CBC with Differential/Platelet     Status: Abnormal   Collection Time: 11/27/15  3:41 AM  Result Value Ref Range   WBC 13.4 (H) 4.0 - 10.5 K/uL   RBC 2.92 (L) 4.22 - 5.81 MIL/uL   Hemoglobin 8.5 (L) 13.0 - 17.0 g/dL   HCT 26.2 (L) 39.0 - 52.0 %   MCV 89.7 78.0 - 100.0 fL   MCH 29.1 26.0 - 34.0 pg   MCHC 32.4 30.0 - 36.0 g/dL   RDW 14.3 11.5 - 15.5 %   Platelets 457 (H) 150 - 400 K/uL   Neutrophils Relative % 95 %   Neutro Abs 12.8 (H) 1.7 - 7.7 K/uL   Lymphocytes Relative 3 %   Lymphs Abs 0.4 (L) 0.7 - 4.0 K/uL   Monocytes Relative 2 %   Monocytes Absolute 0.2 0.1 - 1.0 K/uL   Eosinophils Relative 0 %   Eosinophils  Absolute 0.0 0.0 - 0.7 K/uL   Basophils Relative 0 %   Basophils Absolute 0.0 0.0 - 0.1 K/uL  Phosphorus     Status: None   Collection Time: 11/27/15  3:41 AM  Result Value Ref Range   Phosphorus 3.0 2.5 - 4.6 mg/dL  Magnesium     Status: None   Collection Time: 11/27/15  3:41 AM  Result Value Ref Range   Magnesium 1.7 1.7 - 2.4 mg/dL  Glucose, capillary     Status: Abnormal   Collection Time: 11/27/15  3:45 AM  Result Value Ref Range   Glucose-Capillary 110 (H) 65 - 99 mg/dL   Comment 1 Notify RN    Comment 2 Document in Chart   Glucose, capillary     Status: None   Collection Time: 11/27/15  8:39 AM  Result Value Ref Range   Glucose-Capillary 86 65 - 99 mg/dL   Comment 1 Notify RN   Glucose, capillary     Status: None   Collection Time: 11/27/15 12:16 PM  Result Value Ref Range   Glucose-Capillary 95 65 - 99 mg/dL   Comment 1 Notify RN   Glucose, capillary     Status: None   Collection Time: 11/27/15  3:56 PM  Result Value Ref Range   Glucose-Capillary 98 65 - 99 mg/dL  Glucose, capillary     Status: Abnormal   Collection Time: 11/27/15  7:27 PM  Result Value Ref Range   Glucose-Capillary 108 (H) 65 - 99 mg/dL   Comment 1 Notify RN    Comment 2 Document in Chart   Glucose, capillary     Status:  Abnormal   Collection Time: 11/28/15 12:15 AM  Result Value Ref Range   Glucose-Capillary 110 (H) 65 - 99 mg/dL  Magnesium in AM     Status: None   Collection Time: 11/28/15  3:36 AM  Result Value Ref Range   Magnesium 2.0 1.7 - 2.4 mg/dL  CBC with Differential/Platelet     Status: Abnormal   Collection Time: 11/28/15  3:36 AM  Result Value Ref Range   WBC 10.6 (H) 4.0 - 10.5 K/uL   RBC 3.22 (L) 4.22 - 5.81 MIL/uL   Hemoglobin 9.4 (L) 13.0 - 17.0 g/dL   HCT 23.9 (L) 55.1 - 86.3 %   MCV 91.0 78.0 - 100.0 fL   MCH 29.2 26.0 - 34.0 pg   MCHC 32.1 30.0 - 36.0 g/dL   RDW 39.1 36.0 - 12.8 %   Platelets 613 (H) 150 - 400 K/uL   Neutrophils Relative % 88 %   Neutro Abs 9.3  (H) 1.7 - 7.7 K/uL   Lymphocytes Relative 8 %   Lymphs Abs 0.9 0.7 - 4.0 K/uL   Monocytes Relative 4 %   Monocytes Absolute 0.4 0.1 - 1.0 K/uL   Eosinophils Relative 0 %   Eosinophils Absolute 0.0 0.0 - 0.7 K/uL   Basophils Relative 0 %   Basophils Absolute 0.0 0.0 - 0.1 K/uL  Renal function panel     Status: Abnormal   Collection Time: 11/28/15  3:36 AM  Result Value Ref Range   Sodium 144 135 - 145 mmol/L   Potassium 2.8 (L) 3.5 - 5.1 mmol/L   Chloride 105 101 - 111 mmol/L   CO2 30 22 - 32 mmol/L   Glucose, Bld 104 (H) 65 - 99 mg/dL   BUN 14 6 - 20 mg/dL   Creatinine, Ser 1.12 0.61 - 1.24 mg/dL   Calcium 7.9 (L) 8.9 - 10.3 mg/dL   Phosphorus 2.1 (L) 2.5 - 4.6 mg/dL   Albumin 1.7 (L) 3.5 - 5.0 g/dL   GFR calc non Af Amer >60 >60 mL/min   GFR calc Af Amer >60 >60 mL/min    Comment: (NOTE) The eGFR has been calculated using the CKD EPI equation. This calculation has not been validated in all clinical situations. eGFR's persistently <60 mL/min signify possible Chronic Kidney Disease.    Anion gap 9 5 - 15  Glucose, capillary     Status: None   Collection Time: 11/28/15  6:03 AM  Result Value Ref Range   Glucose-Capillary 86 65 - 99 mg/dL   Dg Chest Port 1 View  11/27/2015  CLINICAL DATA:  Pulmonary edema EXAM: PORTABLE CHEST 1 VIEW COMPARISON:  November 26, 2015 FINDINGS: Endotracheal tube and nasogastric tube have been removed. Central catheter tip is in the superior vena cava. No pneumothorax. There is persistent consolidation in the left base with small left effusion. There has been interval clearing of airspace opacity from the right base. Currently there is a slight degree of underlying interstitial pulmonary edema. Heart is upper normal in size with pulmonary vascularity within normal limits. IMPRESSION: Trace interstitial edema. Airspace consolidation left base with small left effusion. Suspect a degree of pneumonia in the left base region. Airspace opacity from the right base  has cleared. No change in cardiac silhouette. Electronically Signed   By: Bretta Bang III M.D.   On: 11/27/2015 07:37       Medical Problem List and Plan: 1.  Debilitation secondary to respiratory failure/small bowel repair/septic shock Maxcine Ham pneumonia /multiple  medical 2.  DVT Prophylaxis/Anticoagulation: Subcutaneous heparin. Monitor for any bleeding episode 3. Pain Management: Oxycodone as needed 4. Acute blood loss anemia. Follow-up CBC 5. Neuropsych: This patient is capable of making decisions on his own behalf. 6. Skin/Wound Care: Routine skin checks 7. Fluids/Electrolytes/Nutrition: Routine I&O with follow-up chemistries 8. A. fib with RVR/complete heart block. Continue amiodarone.Cardiac rate controlled. 9. Acute on chronic systolic congestive heart failure. Monitor for any signs of fluid overload. Continue Lasix 40 mg daily as directed 10. Adrenal insufficiency. Weaning Solu-Cortef 11. Mood/bipolar disorder.  Lamictal 200 mg daily at bedtime as well as Seroquel 50 mg daily at bedtime and titrate up the home dose of 300 mg as needed. 12. Decreased nutritional storage. Advance diet as tolerated. 13. Alcohol polysubstance abuse. Counseling. Monitor for any signs of withdrawal 14. Hypokalemia. Continue potassium supplement. Follow-up chemistries   Post Admission Physician Evaluation: 1. Functional deficits secondary  to respiratory failure/small bowel repair/septic shock Burman Blacksmith pneumonia /multiple medical. 2. Patient is admitted to receive collaborative, interdisciplinary care between the physiatrist, rehab nursing staff, and therapy team. 3. Patient's level of medical complexity and substantial therapy needs in context of that medical necessity cannot be provided at a lesser intensity of care such as a SNF. 4. Patient has experienced substantial functional loss from his/her baseline which was documented above under the "Functional History" and "Functional Status"  headings.  Judging by the patient's diagnosis, physical exam, and functional history, the patient has potential for functional progress which will result in measurable gains while on inpatient rehab.  These gains will be of substantial and practical use upon discharge  in facilitating mobility and self-care at the household level. 5. Physiatrist will provide 24 hour management of medical needs as well as oversight of the therapy plan/treatment and provide guidance as appropriate regarding the interaction of the two. 6. 24 hour rehab nursing will assist with bladder management, safety, skin/wound care, disease management, medication administration and patient education and help integrate therapy concepts, techniques,education, etc. 7. PT will assess and treat for/with: Lower extremity strength, range of motion, stamina, balance, functional mobility, safety, adaptive techniques and equipment, woundcare, coping skills, pain control, education.   Goals are: Supervision/Min A. 8. OT will assess and treat for/with: ADL's, functional mobility, safety, upper extremity strength, adaptive techniques and equipment, wound mgt, ego support, and community reintegration.   Goals are: Supervision/MinA. Therapy may not proceed with showering this patient. 9. SLP will assess and treat for/with: swallowing.  Goals are: Mod I/Ind. 10. Case Management and Social Worker will assess and treat for psychological issues and discharge planning. 11. Team conference will be held weekly to assess progress toward goals and to determine barriers to discharge. 12. Patient will receive at least 3 hours of therapy per day at least 5 days per week. 13. ELOS: 12-15 days.       14. Prognosis:  good  Delice Lesch, MD  11/28/2015

## 2015-11-28 NOTE — Progress Notes (Signed)
[facility] Progress Note                                            Patient Demographics:    Shawn [name], is a 65 y.o. male, DOB - 07/01/1951, JGG:[id number]  Admit date - 11/17/2015   Admitting Physician Donnie Mesa, MD  Outpatient Primary MD for the patient is Tammi Sou, MD  [facility] - [facility]  [facility]   Chief Complaint  Patient presents with  . Hypotension  . Hematemesis    Summary  65 year old male with a PMH of ETOH, tobacco abuse and bipolar disorder who presented to the emergency department on 3/17 with a 3 day hx of abdominal pain, nausea, vomiting and constipation. Found to have CT concerning for diffuse abdominal pneumatosis and elevated lactic acid. Patient was taken to OR for ex-lap, found to have viable gut, diffuse pneumatosis (no exact cause) & no evidence of dead bowel. He was left open and taken to the MICU. He developed CHB on 3/18 and MI. Later 3/19, was taken to OR for wound closure but developed CHB in OR and VAC was applied. Subsequently taken to cath lab 3/20 for temporary pacemaker insertion.   He was transferred to hospitalist service on 11/28/2015 on day 11 of his hospital stay, he has finished all his antibiotics and currently on no antibiotics or antifungal's. He is awaiting placement.   Subjective:    Shawn [name] today has, No headache, No chest pain, No abdominal pain - No Nausea, No new weakness tingling or numbness, No Cough - SOB.     Assessment  & Plan :     1. Septic shock due to ? small bowel ischemia requiring laparotomy with bowel resection and anastomosis, has finished antifungal and  antibiotic treatments. Sepsis has resolved. Was under critical care service now transferred to hospitalist on 11/28/2015.  2. Postoperative acute hypoxic respiratory failure requiring intubation and mechanical ventilation, complicated by Klebsiella pneumonia. Now on nasal cannula, finished all antibiotic treatment, continue supportive care with oxygen and nebulizer treatments, and it IS and flutter valve.  3. Complete heart block with runs of atrial fibrillation. Question sick sinus syndrome. Seen by cardiology, required temporary pacemaker which has been discontinued, currently on amiodarone and aspirin combination per cardiology.  4. Acute on chronic systolic heart failure. EF 45%. Now close to compensated, continue home dose Lasix, no other medications, seen by cardiology.  5. Hypokalemia. Replace and monitor.  6. Moderate protein calorie malnutrition. On protein supplementation.  7. Acute blood loss stated anemia during the perioperative period. Stable no transfusion needed.  8. Delirium. Started on Seroquel but much less than home dose, also started on home dose Lamictal.  9. Leukocytosis. He  was on steroids in the ICU, trending down, monitor, afebrile.  10. Steroid-induced hyperglycemia. On SSI monitor.  11. Relative adrenal insufficiency in ICU. Per ICU wean off Solu-Cortef.    Code Status : Full  Family Communication  : None  Disposition Plan  : SNF/CIR in 1-2 days  Barriers For Discharge :   Consults  :  PCCM, CCS, Cards  Procedures  :   3/17 Admission, exploratory laparotomy 3/18 Complete heart block 3/18 MI - EF 45% on echo 3/19 OR for wound closure 3/20 Cath lab for temp pacemaker  3/20 - RT reports pt returned from cath lab after temporary pacemaker insertion to holding, pending transfer to ICU. Pt remains febrile to 101, requiring 12 mcg levophed  11/21/15 - EP reports acc jnl but has cleared patient for surgery.    11/22/15 - s/o small bowel anast and abd  closure yesterday .   11/23/15 - pacer dc'ed by cards yesterday.     TTE  Very technically diffult study as patient is immobile on the ventilator. LVEF is reduced to 40-45% with inferior and lateral severe hypokinesis to akinesis. The IVC is dilated in the setting of positive pressure ventilation. The RV is mildly dilated with reduced systolic function. There is no pericrdial effusion.   DVT Prophylaxis  :   Heparin    Lab Results  Component Value Date   PLT 613* 11/28/2015    Antibiotics  :    Anti-infectives    Start     Dose/Rate Route Frequency Ordered Stop   11/21/15 1400  piperacillin-tazobactam (ZOSYN) IVPB 3.375 g  Status:  Discontinued     3.375 g 12.5 mL/hr over 240 Minutes Intravenous 3 times per day 11/21/15 1205 11/26/15 1325   11/19/15 2000  vancomycin (VANCOCIN) IVPB 1000 mg/200 mL premix  Status:  Discontinued     1,000 mg 200 mL/hr over 60 Minutes Intravenous Every 24 hours 11/19/15 1857 11/24/15 1051   11/18/15 1800  anidulafungin (ERAXIS) 100 mg in sodium chloride 0.9 % 100 mL IVPB     100 mg over 90 Minutes Intravenous Every 24 hours 11/17/15 1812 11/27/15 1803   11/17/15 2200  piperacillin-tazobactam (ZOSYN) IVPB 2.25 g  Status:  Discontinued     2.25 g 100 mL/hr over 30 Minutes Intravenous 3 times per day 11/17/15 1930 11/21/15 1205   11/17/15 1830  anidulafungin (ERAXIS) 200 mg in sodium chloride 0.9 % 200 mL IVPB     200 mg over 180 Minutes Intravenous  Once 11/17/15 1812 11/17/15 2203   11/17/15 1815  vancomycin (VANCOCIN) 1,500 mg in sodium chloride 0.9 % 500 mL IVPB     1,500 mg 250 mL/hr over 120 Minutes Intravenous  Once 11/17/15 1813 11/17/15 2103   11/17/15 1530  piperacillin-tazobactam (ZOSYN) IVPB 3.375 g     3.375 g 12.5 mL/hr over 240 Minutes Intravenous NOW 11/17/15 1521 11/17/15 1942   11/17/15 1515  piperacillin-tazobactam (ZOSYN) IVPB 4.5 g  Status:  Discontinued     4.5 g 200 mL/hr over 30 Minutes Intravenous  Once 11/17/15 1505  11/18/15 0040        Objective:   Filed Vitals:   11/27/15 2300 11/28/15 0004 11/28/15 0410 11/28/15 1001  BP: 116/68 126/61 117/64 129/56  Pulse: 83 69 71 64  Temp:  98.7 F (37.1 C) 98.6 F (37 C) 99.1 F (37.3 C)  TempSrc:  Oral Oral Oral  Resp: '17  18 18  '$ Height:  '5\' 11"'$  (1.803 m)  Weight:  76.794 kg (169 lb 4.8 oz)    SpO2: 96% 93% 94% 94%    Wt Readings from Last 3 Encounters:  11/28/15 76.794 kg (169 lb 4.8 oz)  11/20/13 71.8 kg (158 lb 4.6 oz)  11/17/13 72.576 kg (160 lb)     Intake/Output Summary (Last 24 hours) at 11/28/15 1114 Last data filed at 11/28/15 0900  Gross per 24 hour  Intake    567 ml  Output   1055 ml  Net   -488 ml     Physical Exam  Awake, confused, No new F.N deficits, Normal affect [place].AT,PERRAL Supple Neck,No JVD, No cervical lymphadenopathy appriciated.  Symmetrical Chest wall movement, Good air movement bilaterally, CTAB RRR,No Gallops,Rubs or new Murmurs, No Parasternal Heave +ve B.Sounds, Abd Soft, No tenderness, No organomegaly appriciated, No rebound - guarding or rigidity. Abd incision under bandage No Cyanosis, Clubbing or edema, No new Rash or bruise       Data Review:    CBC  Recent Labs Lab 11/24/15 0320 11/25/15 0430 11/26/15 0400 11/27/15 0341 11/28/15 0336  WBC 14.4* 12.0* 11.7* 13.4* 10.6*  HGB 9.3* 9.3* 9.4* 8.5* 9.4*  HCT 28.1* 27.6* 28.1* 26.2* 29.3*  PLT 260 302 364 457* 613*  MCV 89.2 89.3 88.6 89.7 91.0  MCH 29.5 30.1 29.7 29.1 29.2  MCHC 33.1 33.7 33.5 32.4 32.1  RDW 14.2 14.2 14.0 14.3 14.5  LYMPHSABS  --   --  0.4* 0.4* 0.9  MONOABS  --   --  0.3 0.2 0.4  EOSABS  --   --  0.0 0.0 0.0  BASOSABS  --   --  0.0 0.0 0.0    Chemistries   Recent Labs Lab 11/23/15 0319 11/23/15 1042 11/24/15 0320 11/25/15 0430 11/26/15 0400 11/27/15 0341 11/28/15 0336  NA 142 143 141 139 141 143 144  K 4.3 3.7 3.0* 3.3* 2.8* 3.2* 2.8*  CL 106 110 105 107 102 103 105  CO2 '23 24 28 24 28 30 30  '$ GLUCOSE  104* 114* 129* 128* 141* 116* 104*  BUN 19 21* '18 15 14 15 14  '$ CREATININE 1.73* 1.80* 1.54* 1.15 1.22 1.04 0.91  CALCIUM 7.0* 6.8* 7.1* 7.2* 7.6* 7.8* 7.9*  MG 1.8  --  2.0 1.9  --  1.7 2.0  AST  --  29  --   --  49*  --   --   ALT  --  19  --   --  26  --   --   ALKPHOS  --  162*  --   --  267*  --   --   BILITOT  --  1.6*  --   --  1.3*  --   --    ------------------------------------------------------------------------------------------------------------------ No results for input(s): CHOL, HDL, LDLCALC, TRIG, CHOLHDL, LDLDIRECT in the last 72 hours.  No results found for: HGBA1C ------------------------------------------------------------------------------------------------------------------ No results for input(s): TSH, T4TOTAL, T3FREE, THYROIDAB in the last 72 hours.  Invalid input(s): FREET3 ------------------------------------------------------------------------------------------------------------------ No results for input(s): VITAMINB12, FOLATE, FERRITIN, TIBC, IRON, RETICCTPCT in the last 72 hours.  Coagulation profile  Recent Labs Lab 11/23/15 1042  INR 1.45    No results for input(s): DDIMER in the last 72 hours.  Cardiac Enzymes  Recent Labs Lab 11/23/15 1042 11/23/15 1708 11/24/15 0320  TROPONINI 0.14* 0.06* 0.06*   ------------------------------------------------------------------------------------------------------------------ No results found for: BNP  Inpatient Medications  Scheduled Meds: . amiodarone  400 mg Oral BID  . antiseptic oral rinse  7 mL  Mouth Rinse QID  . aspirin  81 mg Oral Daily  . chlorhexidine gluconate (SAGE KIT)  15 mL Mouth Rinse BID  . feeding supplement  1 Container Oral TID BM  . furosemide  40 mg Oral Daily  . heparin subcutaneous  5,000 Units Subcutaneous 3 times per day  . hydrocortisone sod succinate (SOLU-CORTEF) inj  50 mg Intravenous Q12H  . insulin aspart  0-9 Units Subcutaneous 6 times per day  . lamoTRIgine   200 mg Oral QHS  . pantoprazole  40 mg Oral Daily  . potassium chloride  10 mEq Intravenous Q1 Hr x 4  . potassium chloride  40 mEq Oral BID  . QUEtiapine  50 mg Oral QHS   Continuous Infusions:  PRN Meds:.acetaminophen, albuterol, Gerhardt's butt cream, ondansetron (ZOFRAN) IV, ondansetron **OR** [DISCONTINUED] ondansetron (ZOFRAN) IV, oxyCODONE-acetaminophen, white petrolatum  Micro Results Recent Results (from the past 240 hour(s))  Culture, Urine     Status: None   Collection Time: 11/18/15  5:03 PM  Result Value Ref Range Status   Specimen Description URINE, RANDOM  Final   Special Requests NONE  Final   Culture NO GROWTH 2 DAYS  Final   Report Status 11/20/2015 FINAL  Final  Culture, respiratory (NON-Expectorated)     Status: None   Collection Time: 11/18/15  5:10 PM  Result Value Ref Range Status   Specimen Description TRACHEAL ASPIRATE  Final   Special Requests NONE  Final   Gram Stain   Final    MODERATE WBC PRESENT, PREDOMINANTLY PMN RARE SQUAMOUS EPITHELIAL CELLS PRESENT ABUNDANT GRAM NEGATIVE RODS THIS SPECIMEN IS ACCEPTABLE FOR SPUTUM CULTURE Performed at [facility]    Culture   Final    ABUNDANT KLEBSIELLA PNEUMONIAE Performed at [facility]    Report Status 11/21/2015 FINAL  Final   Organism ID, Bacteria KLEBSIELLA PNEUMONIAE  Final      Susceptibility   Klebsiella pneumoniae - MIC*    AMPICILLIN RESISTANT      AMPICILLIN/SULBACTAM 4 SENSITIVE Sensitive     CEFEPIME <=1 SENSITIVE Sensitive     CEFTAZIDIME <=1 SENSITIVE Sensitive     CEFTRIAXONE <=1 SENSITIVE Sensitive     CIPROFLOXACIN <=0.25 SENSITIVE Sensitive     GENTAMICIN <=1 SENSITIVE Sensitive     IMIPENEM <=0.25 SENSITIVE Sensitive     PIP/TAZO <=4 SENSITIVE Sensitive     TOBRAMYCIN <=1 SENSITIVE Sensitive     TRIMETH/SULFA Value in next row Sensitive      <=20 SENSITIVE(NOTE)    * ABUNDANT KLEBSIELLA PNEUMONIAE  Culture, blood (Routine X 2) w Reflex to ID Panel     Status:  None   Collection Time: 11/19/15  5:25 AM  Result Value Ref Range Status   Specimen Description BLOOD LEFT ANTECUBITAL  Final   Special Requests IN PEDIATRIC BOTTLE 3 CC  Final   Culture NO GROWTH 5 DAYS  Final   Report Status 11/24/2015 FINAL  Final  Culture, blood (Routine X 2) w Reflex to ID Panel     Status: None   Collection Time: 11/19/15  5:32 AM  Result Value Ref Range Status   Specimen Description BLOOD LEFT WRIST  Final   Special Requests IN PEDIATRIC BOTTLE 2 CC  Final   Culture NO GROWTH 5 DAYS  Final   Report Status 11/24/2015 FINAL  Final  Culture, blood (Routine X 2) w Reflex to ID Panel     Status: None (Preliminary result)   Collection Time: 11/23/15 10:50 AM  Result Value Ref Range Status   Specimen Description BLOOD RIGHT HAND  Final   Special Requests BOTTLES DRAWN AEROBIC ONLY 6CC  Final   Culture NO GROWTH 4 DAYS  Final   Report Status PENDING  Incomplete  Culture, blood (Routine X 2) w Reflex to ID Panel     Status: None (Preliminary result)   Collection Time: 11/23/15 11:00 AM  Result Value Ref Range Status   Specimen Description BLOOD RIGHT ARM  Final   Special Requests IN PEDIATRIC BOTTLE 3CC  Final   Culture NO GROWTH 4 DAYS  Final   Report Status PENDING  Incomplete  Culture, respiratory (NON-Expectorated)     Status: None   Collection Time: 11/23/15 11:55 AM  Result Value Ref Range Status   Specimen Description TRACHEAL ASPIRATE  Final   Special Requests Normal  Final   Gram Stain   Final    MODERATE WBC PRESENT,BOTH PMN AND MONONUCLEAR NO SQUAMOUS EPITHELIAL CELLS SEEN NO ORGANISMS SEEN Performed at [facility]    Culture   Final    FEW KLEBSIELLA PNEUMONIAE Performed at [facility]    Report Status 11/25/2015 FINAL  Final   Organism ID, Bacteria KLEBSIELLA PNEUMONIAE  Final      Susceptibility   Klebsiella pneumoniae - MIC*    AMPICILLIN RESISTANT      AMPICILLIN/SULBACTAM 4 SENSITIVE Sensitive     CEFEPIME <=1  SENSITIVE Sensitive     CEFTAZIDIME <=1 SENSITIVE Sensitive     CEFTRIAXONE <=1 SENSITIVE Sensitive     CIPROFLOXACIN <=0.25 SENSITIVE Sensitive     GENTAMICIN <=1 SENSITIVE Sensitive     IMIPENEM <=0.25 SENSITIVE Sensitive     PIP/TAZO 8 SENSITIVE Sensitive     TOBRAMYCIN <=1 SENSITIVE Sensitive     TRIMETH/SULFA Value in next row Sensitive      <=20 SENSITIVE(NOTE)    * FEW KLEBSIELLA PNEUMONIAE    Radiology Reports Ct Abdomen Pelvis Wo Contrast  11/17/2015  CLINICAL DATA:  Acute abdominal pain, nausea and vomiting for 2 days. Elevated lactate. EXAM: CT ABDOMEN AND PELVIS WITHOUT CONTRAST TECHNIQUE: Multidetector CT imaging of the abdomen and pelvis was performed following the standard protocol without IV contrast. COMPARISON:  12/08/2013 FINDINGS: Lower chest:  Minor bibasilar atelectasis versus scarring. Hepatobiliary: Scattered areas of portal venous gas throughout the right and left hepatic lobes. No biliary dilatation. Incidental gallstones layer in the gallbladder, image 39. Pancreas: Diffusely thin atrophic. No ductal dilatation or focal abnormality by noncontrast imaging. Spleen: Within normal limits in size. Adrenals/Urinary Tract: Normal adrenal glands. No hydronephrosis or obstructing ureteral calculus on either side. Ureters are decompressed and symmetric. Left kidney demonstrates a hypodense 2 cm renal cyst in the lower pole medially, image 47. Upper pole left renal cyst anteriorly measures 10 mm, image 34. Stomach/Bowel: Stomach is moderately distended. Beginning with the fourth portion of the duodenum and involving several dilated loops of jejunum there is extensive small bowel pneumatosis throughout the entire abdomen. There is some sparing of the distal ileum. There is diffuse edema throughout the central mesentery and scattered foci of central mesenteric venous gas noted throughout the mesenteric veins. Findings compatible with small bowel ischemia and associated ileus. Colon is  relatively decompressed but contains air. Minor scattered colonic diverticulosis most pronounced in the sigmoid. No fluid collection or abscess. No definite pneumoperitoneum. Vascular/Lymphatic: Extensive atherosclerosis of the aorta and abdominal vasculature. No aneurysm or retroperitoneal hemorrhage. No significant adenopathy. Reproductive: No mass or other significant abnormality. Other: Right hip  fixation hardware creates artifact. No inguinal abnormality or hernia. Small fat containing umbilical hernia evident. Postop changes from inguinal hernia repairs without recurrence. Musculoskeletal: Bones are osteopenic. Degenerative changes of the spine. No acute osseous finding. IMPRESSION: Diffuse extensive small bowel pneumatosis with mild dilatation throughout the abdomen and pelvis involving the distal duodenum and the jejunum with some sparing of the ileum compatible with small bowel ischemia. Diffuse central mesenteric and portal venous gas with central mesenteric edema/congestion. Associated diffuse small bowel ileus Extensive abdominal and aortic atherosclerosis No significant pneumoperitoneum or abscess Cholelithiasis Colonic diverticulosis These results were called by telephone at the time of interpretation on 11/17/2015 at 3:15 pm to Dr. Ezequiel Essex , who verbally acknowledged these results. Electronically Signed   By: Jerilynn Mages.  Shick M.D.   On: 11/17/2015 15:19   Dg Chest Port 1 View  11/27/2015  CLINICAL DATA:  Pulmonary edema EXAM: PORTABLE CHEST 1 VIEW COMPARISON:  November 26, 2015 FINDINGS: Endotracheal tube and nasogastric tube have been removed. Central catheter tip is in the superior vena cava. No pneumothorax. There is persistent consolidation in the left base with small left effusion. There has been interval clearing of airspace opacity from the right base. Currently there is a slight degree of underlying interstitial pulmonary edema. Heart is upper normal in size with pulmonary vascularity within  normal limits. IMPRESSION: Trace interstitial edema. Airspace consolidation left base with small left effusion. Suspect a degree of pneumonia in the left base region. Airspace opacity from the right base has cleared. No change in cardiac silhouette. Electronically Signed   By: Lowella Grip III M.D.   On: 11/27/2015 07:37   Dg Chest Port 1 View  11/26/2015  CLINICAL DATA:  Pulmonary edema EXAM: PORTABLE CHEST 1 VIEW COMPARISON:  11/24/2015 FINDINGS: Cardiomediastinal silhouette is stable. Endotracheal tube in place with tip 3.9 cm above the carina. Stable right IJ central line with tip in SVC. Stable NG tube position. No pulmonary edema. Persistent hazy bilateral basilar atelectasis or infiltrate right greater than left. Slight improvement in aeration in upper lungs. IMPRESSION: Stable support apparatus. Slight improvement in aeration in upper lungs. Persistent bilateral basilar hazy atelectasis or infiltrate right greater than left. No pulmonary edema. Electronically Signed   By: Lahoma Crocker M.D.   On: 11/26/2015 09:48   Dg Chest Port 1 View  11/24/2015  CLINICAL DATA:  Check endotracheal tube placement EXAM: PORTABLE CHEST 1 VIEW COMPARISON:  11/23/2015 FINDINGS: Cardiac shadow is stable. An endotracheal tube, nasogastric catheter and right jugular central line are again seen and stable. Bilateral pleural effusions right greater than left are noted. Bibasilar infiltrates are again seen right greater than left. No new focal abnormality is noted. IMPRESSION: Stable bibasilar changes worse on the right than the left. Electronically Signed   By: Inez Catalina M.D.   On: 11/24/2015 07:48   Dg Chest Port 1 View  11/23/2015  CLINICAL DATA:  Respiratory difficulty EXAM: PORTABLE CHEST 1 VIEW COMPARISON:  Yesterday FINDINGS: Tubular device is stable. Bibasilar opacity right renal left is stable on the right but improved on the left. This likely represents a combination of pleural fluid and underlying airspace  disease. Overall vascular congestion has improved. No pneumothorax. IMPRESSION: Bilateral pleural effusions and airspace disease is stable on the right but improved on the left. Overall improved vascular congestion. Electronically Signed   By: Marybelle Killings M.D.   On: 11/23/2015 07:42   Dg Chest Port 1 View  11/22/2015  CLINICAL DATA:  Endotracheal tube present  [id number] EXAM: PORTABLE CHEST - 1 VIEW COMPARISON:  the previous day's study FINDINGS: Endotracheal tube, nasogastric tube, and right IJ central line are stable in position. Probable layering pleural effusions bilaterally right greater than left. Probable adjacent atelectasis/ consolidation in the lung bases. Central pulmonary vascular congestion. Heart size upper limits normal for technique. No pneumothorax. Visualized skeletal structures are unremarkable. IMPRESSION: 1. Persistent layering pleural effusions and bibasilar atelectasis/consolidation. 2.  Support hardware stable in position. Electronically Signed   By: Corlis Leak M.D.   On: 11/22/2015 10:57   Dg Chest Port 1 View  11/21/2015  CLINICAL DATA:  Hypoxia EXAM: PORTABLE CHEST 1 VIEW COMPARISON:  November 19, 2015 FINDINGS: Endotracheal tube tip is 4.1 cm above the carina. Nasogastric tube tip and side port are below the diaphragm. Central catheter tip is in the superior cava. No pneumothorax. There is now mild cardiomegaly with bilateral pleural effusions. There is atelectatic change in the bases. The pulmonary vascularity appears within normal limits. No adenopathy evident. IMPRESSION: Tube and catheter positions as described without pneumothorax. Bilateral effusions with bibasilar atelectasis. Mild cardiomegaly. Suspect a degree of underlying congestive heart failure. Electronically Signed   By: Bretta Bang III M.D.   On: 11/21/2015 08:00   Dg Chest Port 1 View  11/19/2015  CLINICAL DATA:  Respiratory failure EXAM: PORTABLE CHEST 1 VIEW COMPARISON:  Chest radiograph from one day prior.  FINDINGS: Endotracheal tube tip is 6 cm above the carina. Right internal jugular central venous catheter terminates in the middle third of the superior vena cava. Enteric tube enters stomach with the tip not seen on this image. Stable cardiomediastinal silhouette with normal heart size. No pneumothorax. No pleural effusion. Improved lung volumes with resolved atelectasis. No pulmonary edema. IMPRESSION: 1. Endotracheal tube tip is 6 cm above the carina, consider advancing 1-2 cm. 2. No active cardiopulmonary disease. Lung volumes are improved and basilar atelectasis has resolved. Electronically Signed   By: Delbert Phenix M.D.   On: 11/19/2015 09:11   Portable Chest Xray  11/18/2015  CLINICAL DATA:  Respiratory failure EXAM: PORTABLE CHEST 1 VIEW COMPARISON:  Chest radiograph from one day prior. FINDINGS: Endotracheal tube tip is 2.0 cm above the carina. Enteric tube enters the stomach with the tip not seen on this image. Right internal jugular central venous catheter terminates at the cavoatrial junction. Stable cardiomediastinal silhouette with normal heart size. No pneumothorax. Trace right pleural effusion. No overt pulmonary edema. Hazy opacities at the right greater than left lung bases, slightly worsened bilaterally. IMPRESSION: 1. Well-positioned support hardware as described . 2. Slightly worsened hazy bibasilar lung opacities, right greater than left, favor atelectasis, cannot exclude a developing pneumonia. Electronically Signed   By: Delbert Phenix M.D.   On: 11/18/2015 09:56   Portable Chest Xray  11/17/2015  CLINICAL DATA:  Acute respiratory failure with hypoxemia EXAM: PORTABLE CHEST 1 VIEW COMPARISON:  Chest radiograph from earlier today. FINDINGS: Endotracheal tube tip is 4 cm above the carina. Enteric tube terminates in the proximal stomach. Right internal jugular central venous catheter terminates in the middle third of the superior vena cava. Stable cardiomediastinal silhouette with normal  heart size. No pneumothorax. No pleural effusion. No pulmonary edema. Mild right basilar scarring versus atelectasis. No acute consolidative airspace disease. IMPRESSION: Well-positioned support structures as described. Mild scarring versus atelectasis at the right lung base. Electronically Signed   By: Delbert Phenix M.D.   On: 11/17/2015 18:48   Dg Chest Portable 1 View  11/17/2015  CLINICAL  DATA:  Shortness of breath, decreased blood pressure in O2 sats. Cough. Aspiration? EXAM: PORTABLE CHEST 1 VIEW COMPARISON:  Chest x-ray dated 12/08/2013. FINDINGS: Cardiomediastinal silhouette is stable in size and configuration. Probable mild scarring at the right lung base. Lungs otherwise clear. No pleural effusion or pneumothorax seen. Osseous and soft tissue structures about the chest are unremarkable. IMPRESSION: No active disease. Electronically Signed   By: Franki Cabot M.D.   On: 11/17/2015 14:49   Dg Abd Portable 1v  11/17/2015  CLINICAL DATA:  Severe lower abdominal pain.  GI bleed. EXAM: PORTABLE ABDOMEN - 1 VIEW COMPARISON:  12/08/2013 CT abdomen/pelvis. FINDINGS: There is prominent pneumatosis involving mildly dilated small bowel loops throughout the abdomen. Surgical sutures are noted in the left mid abdomen. No gross pneumoperitoneum on this supine image. Moderate stool in the rectum. Partially visualized left proximal femur hardware. IMPRESSION: Severe pneumatosis involving mildly dilated small bowel loops throughout the abdomen. Findings are worrisome for diffuse small bowel ischemia. Recommend correlation with CT abdomen/pelvis with IV contrast. These results were called by telephone at the time of interpretation on 11/17/2015 at 2:48 pm to Dr. Ezequiel Essex , who verbally acknowledged these results. Electronically Signed   By: Ilona Sorrel M.D.   On: 11/17/2015 14:50    Time Spent in minutes   35   SINGH,PRASHANT K M.D on 11/28/2015 at 11:14 AM  Between 7am to 7pm - Pager -  [phone]  After 7pm go to [email or web address] - password [facility]  [facility] -  Office  [phone]

## 2015-11-28 NOTE — NC FL2 (Signed)
[place] LEVEL OF CARE SCREENING TOOL     IDENTIFICATION  Patient Name: Shawn [name] Birthdate: 04/02/1951 Sex: male Admission Date (Current Location): 11/17/2015  [facility] and [place] Number:  [employer] and Address:  [facility] [place], [place] [address], [place], [place] [address]      Provider Number: [id number]  Attending Physician Name and Address:  Thurnell Lose, MD  Relative Name and Phone Number:       Current Level of Care: Hospital Recommended Level of Care: [place] Prior Approval Number:    Date Approved/Denied:   PASRR Number:    Discharge Plan: SNF    Current Diagnoses: Patient Active Problem List   Diagnosis Date Noted  . Mesenteric ischemia ([place])   . Polysubstance abuse   . Bipolar affective disorder in remission ([place])   . History of ventilator dependency ([place])   . AKI (acute kidney injury) ([place])   . Dysphagia   . Tachycardia   . Tachypnea   . Hypokalemia   . Leukocytosis   . Acute blood loss anemia   . Acute respiratory failure ([place])   . Persistent atrial fibrillation ([place])   . Acute on chronic combined systolic and diastolic CHF (congestive heart failure) ([place])   . Acute respiratory failure with hypoxemia ([place])   . Cardiomyopathy, ischemic 11/19/2015  . Complete heart block ([place]) 11/18/2015  . Malnutrition of moderate degree 11/18/2015  . Pressure ulcer 11/17/2015  . Septic shock ([place]) 11/17/2015  . Pneumatosis coli 11/17/2015  . Gastroenteritis due to norovirus 11/25/2013  . Other pancytopenia ([place]) 11/25/2013  . Dehydration 11/20/2013  . MDD (major depressive disorder) ([place]) 11/17/2013  . Depression 11/17/2013  . Fall 11/16/2013  . Bipolar affective disorder, current episode mixed ([place]) 04/12/2013  . Alcohol abuse with intoxication ([place]) 04/12/2013  . Alcohol dependence ([place]) 04/12/2013  . Bipolar I disorder, most recent episode depressed ([place]) 04/12/2013  . Constipation  08/08/2012  . Hip fracture, left ([place]) 08/06/2012  . Traumatic hematoma of buttock 08/06/2012  . ETOH abuse 08/06/2012  . H/O: substance abuse 08/06/2012  . Tobacco abuse 08/06/2012  . History of hernia repair 08/06/2012  . Hip fracture, intertrochanteric ([place]) 08/06/2012  . Bipolar disorder, current episode mixed, mild ([place]) 02/04/2012  . Health maintenance examination 10/24/2011  . Hypoxia 10/24/2011  . Prostate cancer screening 09/19/2011  . Tobacco dependence 09/08/2011  . Erectile dysfunction 08/06/2011  . Osteoarthritis of left knee 08/06/2011  . Elevated blood pressure reading without diagnosis of hypertension 08/06/2011  . History of depression 08/06/2011    Orientation RESPIRATION BLADDER Height & Weight     Self  Normal Incontinent Weight: 169 lb 4.8 oz (76.794 kg) Height:  _0  (180.3 cm)  BEHAVIORAL SYMPTOMS/MOOD NEUROLOGICAL BOWEL NUTRITION STATUS      Incontinent Diet (see DC)  AMBULATORY STATUS COMMUNICATION OF NEEDS Skin   Extensive Assist Verbally PU Stage and Appropriate Care                       Personal Care Assistance Level of Assistance  Bathing, Dressing Bathing Assistance: Maximum assistance   Dressing Assistance: Maximum assistance     Functional Limitations Info             SPECIAL CARE FACTORS FREQUENCY  PT (By licensed PT), OT (By licensed OT)     PT Frequency: 5/wk OT Frequency: 5/wk  Contractures      Additional Factors Info  Code Status, Allergies, Psychotropic, Insulin Sliding Scale   Allergies Info: Codeine, Depakote Psychotropic Info: seroquel Insulin Sliding Scale Info: 6/day       Current Medications (11/28/2015):  This is the current hospital active medication list Current Facility-Administered Medications  Medication Dose Route Frequency Provider Last Rate Last Dose  . acetaminophen (TYLENOL) tablet 650 mg  650 mg Oral Q4H PRN Evans Lance, MD      . albuterol (PROVENTIL) (2.5 MG/3ML) 0.083%  nebulizer solution 2.5 mg  2.5 mg Nebulization Q3H PRN Donita Brooks, NP      . amiodarone (PACERONE) tablet 400 mg  400 mg Oral BID Javier Glazier, MD   400 mg at 11/28/15 0957  . antiseptic oral rinse solution (CORINZ)  7 mL Mouth Rinse QID Juanito Doom, MD   7 mL at 11/28/15 1200  . aspirin chewable tablet 81 mg  81 mg Oral Daily Javier Glazier, MD   81 mg at 11/28/15 0957  . chlorhexidine gluconate (SAGE KIT) (PERIDEX) 0.12 % solution 15 mL  15 mL Mouth Rinse BID Juanito Doom, MD   15 mL at 11/28/15 0800  . feeding supplement (BOOST / RESOURCE BREEZE) liquid 1 Container  1 Container Oral TID BM Brand Males, MD   1 Container at 11/28/15 1000  . furosemide (LASIX) tablet 40 mg  40 mg Oral Daily Javier Glazier, MD   40 mg at 11/28/15 0957  . Gerhardt's butt cream   Topical PRN Brand Males, MD   1 application at 27/03/50 1002  . heparin injection 5,000 Units  5,000 Units Subcutaneous 3 times per day Raylene Miyamoto, MD   5,000 Units at 11/28/15 1422  . [START ON 11/29/2015] hydrocortisone sodium succinate (SOLU-CORTEF) 100 MG injection 50 mg  50 mg Intravenous Daily Thurnell Lose, MD      . insulin aspart (novoLOG) injection 0-9 Units  0-9 Units Subcutaneous 6 times per day Juanito Doom, MD   1 Units at 11/26/15 0335  . lamoTRIgine (LAMICTAL) tablet 200 mg  200 mg Oral QHS Thurnell Lose, MD      . ondansetron [facility]) injection 4 mg  4 mg Intravenous Q6H PRN Evans Lance, MD   4 mg at 11/26/15 1715  . ondansetron (ZOFRAN-ODT) disintegrating tablet 4 mg  4 mg Oral Q6H PRN Greer Pickerel, MD      . oxyCODONE-acetaminophen (PERCOCET/ROXICET) 5-325 MG per tablet 1-2 tablet  1-2 tablet Oral Q4H PRN Judeth Horn, MD   2 tablet at 11/28/15 1008  . pantoprazole (PROTONIX) EC tablet 40 mg  40 mg Oral Daily Javier Glazier, MD   40 mg at 11/28/15 0957  . potassium chloride SA (K-DUR,KLOR-CON) CR tablet 40 mEq  40 mEq Oral BID Judeth Horn, MD   40 mEq at 11/28/15 1159   . QUEtiapine (SEROQUEL) tablet 50 mg  50 mg Oral QHS Thurnell Lose, MD      . white petrolatum (VASELINE) gel   Topical PRN Grace Bushy Minor, NP   0.2 application at 09/38/18 0441     Discharge Medications: Please see discharge summary for a list of discharge medications.  Relevant Imaging Results:  Relevant Lab Results:   Additional Information SS#: [id number]  Cranford Mon, [place]

## 2015-11-28 NOTE — Care Management Note (Signed)
Case Management Note  Patient Details  Name: SATCHEL GRENDA MRN: [id number] Date of Birth: 03/17/1951  Subjective/Objective:    Pt lives with son but plans to stay with his sister who is retired when he is medically stable for discharge.                             Expected Discharge Plan:  [place]  Discharge planning Services  CM Consult  Status of Service:  In process, will continue to follow  Medicare Important Message Given:  Yes  Girard Cooter, RN 11/28/2015, 7:33 AM

## 2015-11-28 NOTE — Progress Notes (Signed)
CCS/Aamilah Augenstein Progress Note 7 Days Post-Op  Subjective: Patient sitting up in pbed, says he is not ding well, but had a large BM.  Smell of urine as you enter the room.   Objective: Vital signs in last 24 hours: Temp:  [97.7 F (36.5 C)-99.1 F (37.3 C)] 99.1 F (37.3 C) (03/28 1001) Pulse Rate:  [37-83] 64 (03/28 1001) Resp:  [14-23] 18 (03/28 1001) BP: (107-130)/(56-84) 129/56 mmHg (03/28 1001) SpO2:  [76 %-99 %] 94 % (03/28 1001) Weight:  [76.794 kg (169 lb 4.8 oz)] 76.794 kg (169 lb 4.8 oz) (03/28 0004) Last BM Date: 11/28/15  Intake/Output from previous day: 03/27 0701 - 03/28 0700 In: 407 [P.O.:240; I.V.:117; IV Piggyback:50] Out: 1370 [Urine:1370] Intake/Output this shift: Total I/O In: 240 [P.O.:240] Out: -   General: No acute distress  Lungs: Clear  Abd: Soft, excellent bowel sounds.  Minimally tender.  Extremities: No changes  Neuro: Intact  Lab Results:  @LABLAST2 (wbc:2,hgb:2,hct:2,plt:2) BMET ) Recent Labs  11/27/15 0341 11/28/15 0336  NA 143 144  K 3.2* 2.8*  CL 103 105  CO2 30 30  GLUCOSE 116* 104*  BUN 15 14  CREATININE 1.04 0.91  CALCIUM 7.8* 7.9*   PT/INR No results for input(s): LABPROT, INR in the last 72 hours. ABG No results for input(s): PHART, HCO3 in the last 72 hours.  Invalid input(s): PCO2, PO2  Studies/Results: Dg Chest Port 1 View  11/27/2015  CLINICAL DATA:  Pulmonary edema EXAM: PORTABLE CHEST 1 VIEW COMPARISON:  November 26, 2015 FINDINGS: Endotracheal tube and nasogastric tube have been removed. Central catheter tip is in the superior vena cava. No pneumothorax. There is persistent consolidation in the left base with small left effusion. There has been interval clearing of airspace opacity from the right base. Currently there is a slight degree of underlying interstitial pulmonary edema. Heart is upper normal in size with pulmonary vascularity within normal limits. IMPRESSION: Trace interstitial edema. Airspace consolidation  left base with small left effusion. Suspect a degree of pneumonia in the left base region. Airspace opacity from the right base has cleared. No change in cardiac silhouette. Electronically Signed   By: Lowella Grip III M.D.   On: 11/27/2015 07:37    Anti-infectives: Anti-infectives    Start     Dose/Rate Route Frequency Ordered Stop   11/21/15 1400  piperacillin-tazobactam (ZOSYN) IVPB 3.375 g  Status:  Discontinued     3.375 g 12.5 mL/hr over 240 Minutes Intravenous 3 times per day 11/21/15 1205 11/26/15 1325   11/19/15 2000  vancomycin (VANCOCIN) IVPB 1000 mg/200 mL premix  Status:  Discontinued     1,000 mg 200 mL/hr over 60 Minutes Intravenous Every 24 hours 11/19/15 1857 11/24/15 1051   11/18/15 1800  anidulafungin (ERAXIS) 100 mg in sodium chloride 0.9 % 100 mL IVPB     100 mg over 90 Minutes Intravenous Every 24 hours 11/17/15 1812 11/27/15 1803   11/17/15 2200  piperacillin-tazobactam (ZOSYN) IVPB 2.25 g  Status:  Discontinued     2.25 g 100 mL/hr over 30 Minutes Intravenous 3 times per day 11/17/15 1930 11/21/15 1205   11/17/15 1830  anidulafungin (ERAXIS) 200 mg in sodium chloride 0.9 % 200 mL IVPB     200 mg over 180 Minutes Intravenous  Once 11/17/15 1812 11/17/15 2203   11/17/15 1815  vancomycin (VANCOCIN) 1,500 mg in sodium chloride 0.9 % 500 mL IVPB     1,500 mg 250 mL/hr over 120 Minutes Intravenous  Once 11/17/15 1813  11/17/15 2103   11/17/15 1530  piperacillin-tazobactam (ZOSYN) IVPB 3.375 g     3.375 g 12.5 mL/hr over 240 Minutes Intravenous NOW 11/17/15 1521 11/17/15 1942   11/17/15 1515  piperacillin-tazobactam (ZOSYN) IVPB 4.5 g  Status:  Discontinued     4.5 g 200 mL/hr over 30 Minutes Intravenous  Once 11/17/15 1505 11/18/15 0040      Assessment/Plan: s/p Procedure(s): SMALL BOWEL REPAIR, SMALL BOWEL ANATAMOSIS AND ABDOMINAL CLOSURE Advance diet  Replace potassium.  LOS: 11 days   Kathryne Eriksson. Dahlia Bailiff, MD,  FACS [phone] [facility] 11/28/2015

## 2015-11-28 NOTE — Progress Notes (Signed)
Rehab admissions - I met with patient at the bedside.  He is confused.  I called and spoke with his son.  He lives with his son who is disabled with Muscular Dystrophy.  Son is in favor of inpatient rehab admission.  I spoke with attending MD.  Patient not medically ready today, likely ready for rehab tomorrow per Dr. Candiss Norse.  I will follow up in am for medical readiness and bed availability.  Call me for questions.  #[phone]

## 2015-11-28 NOTE — Clinical Social Work Note (Signed)
Clinical Social Work Assessment  Patient Details  Name: Shawn [name] MRN: [id number] Date of Birth: 1951/01/13  Date of referral:  11/28/15               Reason for consult:  Facility Placement                Permission sought to share information with:  Family Supports Permission granted to share information::   (pt DO, son next of kin)  Name::     [place]::  SNFs  Relationship::  son  Contact Information:     Housing/Transportation Living arrangements for the past 2 months:  Single Family Home Source of Information:  Adult Children Patient Interpreter Needed:  None Criminal Activity/Legal Involvement Pertinent to Current Situation/Hospitalization:  No - Comment as needed Significant Relationships:  Adult Children Lives with:  Self Do you feel safe going back to the place where you live?  No Need for family participation in patient care:  Yes (Comment) (decision making)  Care giving concerns:  Pt lives alone but has family who he can live with when he is ready for DC from hospital (reportedly a sister)   Facilities manager / plan:  CSW spoke with pt son concerning PT recommendation for rehab prior to return home.  Pt son understands pt will need rehab prior to return home and hopeful for CIR admission- states that pt has been to SNF in the past. CSW discussed need for SNF back up in case CIR unable to admit and explained differences between the two.  Employment status:    Insurance information:  Medicare PT Recommendations:  Inpatient Rehab Consult Information / Referral to community resources:  [place]  Patient/Family's Response to care:  Pt son is agreeable to SNF back and acknowledges pt would not be safe to return home.  Patient/Family's Understanding of and Emotional Response to Diagnosis, Current Treatment, and Prognosis: Son seems to have good understanding of pt situation/care plan and is agreeable to whatever is best for his  father.  Emotional Assessment Appearance:  Appears stated age Attitude/Demeanor/Rapport:  Unable to Assess Affect (typically observed):  Unable to Assess Orientation:  Oriented to Self Alcohol / Substance use:  Alcohol Use, Illicit Drugs (history of) Psych involvement (Current and /or in the community):  No (Comment)  Discharge Needs  Concerns to be addressed:  Childcare Concerns, Discharge Planning Concerns Readmission within the last 30 days:  No Current discharge risk:  Physical Impairment Barriers to Discharge:  Continued Medical Work up   Cranford Mon, LCSW 11/28/2015, 4:50 PM

## 2015-11-28 NOTE — Progress Notes (Signed)
Physical Therapy Treatment Patient Details Name: Shawn [name] MRN: [id number] DOB: 07/24/1951 Today's Date: 11/28/2015    History of Present Illness 65 year old male with a PMH of ETOH, tobacco abuse and bipolar disorder who presented to the emergency department on 3/17 with a 3 day hx of abdominal pain, nausea, vomiting and constipation. Patient was taken to OR for ex-lap, found to have viable gut, diffuse pneumatosis (no exact cause) & no evidence of dead bowel. He was left open and taken to the MICU. He developed CHB on 3/18 and MI. Later 3/19, was taken to OR for wound closure but developed CHB in OR and VAC was applied. Subsequently taken to cath lab 3/20 for temporary pacemaker insertion.  Went back to OR for small bowel anast and abd closure on 3/21 and pacer d/c 3/22.  Was extubated 3/26.     PT Comments    Patient progressing some with transfers compared to last session and tolerated standing longer to step to chair with HR max 100.  More confused today per RN, but reports did not sleep well and remembers thunderstorm last night.  Will continue skilled PT in acute setting and recommendation for CIR appropriate.  Follow Up Recommendations  CIR     Equipment Recommendations  Rolling walker with 5" wheels;3in1 (PT)    Recommendations for Other Services       Precautions / Restrictions Precautions Precautions: Fall Precaution Comments: watch HR Restrictions Weight Bearing Restrictions: No    Mobility  Bed Mobility Overal bed mobility: Needs Assistance Bed Mobility: Supine to Sit     Supine to sit: Mod assist     General bed mobility comments: cues to roll and use rail and even with assist comes up straight, assist for legs and trunk  Transfers Overall transfer level: Needs assistance Equipment used: Rolling walker (2 wheeled) Transfers: Sit to/from [facility] Sit to Stand: Mod assist;+2 physical assistance Stand pivot transfers: Mod assist;+2  physical assistance       General transfer comment: assist from bed increased time and cues for hand placement, continued difficulty getting L LE under him; able to stand step to recliner with +2 A and increased time with walker, but fatigued quickly requesting to sit prior to lining up with chair so assisted for safety  Ambulation/Gait                 Stairs            Wheelchair Mobility    Modified Rankin (Stroke Patients Only)       Balance Overall balance assessment: Needs assistance Sitting-balance support: Bilateral upper extremity supported;Feet supported Sitting balance-Leahy Scale: Poor Sitting balance - Comments: posterior bias initially in sitting   Standing balance support: Bilateral upper extremity supported Standing balance-Leahy Scale: Poor Standing balance comment: UE support and +2 A for balance                     Cognition Arousal/Alertness: Awake/alert Behavior During Therapy: WFL for tasks assessed/performed Overall Cognitive Status: No family/caregiver present to determine baseline cognitive functioning                      Exercises      General Comments General comments (skin integrity, edema, etc.): RN reports more confused this AM patient pulling on lines, IV's, just had condom cath replaced      Pertinent Vitals/Pain Pain Assessment: Faces Faces Pain Scale: Hurts even more Pain Location: abdomen and  R thigh Pain Descriptors / Indicators: Sore Pain Intervention(s): Repositioned;Monitored during session;Limited activity within patient's tolerance    Home Living                      Prior Function            PT Goals (current goals can now be found in the care plan section) Progress towards PT goals: Progressing toward goals    Frequency  Min 3X/week    PT Plan Current plan remains appropriate    Co-evaluation             End of Session Equipment Utilized During Treatment: Gait  belt Activity Tolerance: Patient limited by fatigue Patient left: in chair;with call bell/phone within reach;with chair alarm set     Time: [id number] PT Time Calculation (min) (ACUTE ONLY): 19 min  Charges:  $Therapeutic Activity: 8-22 mins                    G Codes:      Reginia Naas 12/09/2015, [place], [place] 12/09/15

## 2015-11-29 ENCOUNTER — Inpatient Hospital Stay (HOSPITAL_COMMUNITY)
Admission: RE | Admit: 2015-11-29 | Discharge: 2015-12-11 | DRG: 947 | Disposition: A | Discharge status: Skilled Nursing Facility | Payer: Medicare Other | Source: Intra-hospital | Attending: Physical Medicine & Rehabilitation | Admitting: Physical Medicine & Rehabilitation

## 2015-11-29 ENCOUNTER — Inpatient Hospital Stay (HOSPITAL_COMMUNITY): Payer: Self-pay | Admitting: Speech Pathology

## 2015-11-29 ENCOUNTER — Encounter (HOSPITAL_COMMUNITY): Payer: Self-pay | Admitting: *Deleted

## 2015-11-29 ENCOUNTER — Encounter (HOSPITAL_COMMUNITY): Payer: Self-pay | Admitting: General Practice

## 2015-11-29 ENCOUNTER — Inpatient Hospital Stay (HOSPITAL_COMMUNITY): Payer: Self-pay | Admitting: Physical Therapy

## 2015-11-29 ENCOUNTER — Inpatient Hospital Stay (HOSPITAL_COMMUNITY): Payer: Self-pay | Admitting: Occupational Therapy

## 2015-11-29 DIAGNOSIS — K5900 Constipation, unspecified: Secondary | ICD-10-CM | POA: Diagnosis present

## 2015-11-29 DIAGNOSIS — E2740 Unspecified adrenocortical insufficiency: Secondary | ICD-10-CM | POA: Diagnosis present

## 2015-11-29 DIAGNOSIS — Z79899 Other long term (current) drug therapy: Secondary | ICD-10-CM

## 2015-11-29 DIAGNOSIS — I5023 Acute on chronic systolic (congestive) heart failure: Secondary | ICD-10-CM | POA: Diagnosis present

## 2015-11-29 DIAGNOSIS — D62 Acute posthemorrhagic anemia: Secondary | ICD-10-CM | POA: Diagnosis present

## 2015-11-29 DIAGNOSIS — Z888 Allergy status to other drugs, medicaments and biological substances status: Secondary | ICD-10-CM | POA: Diagnosis not present

## 2015-11-29 DIAGNOSIS — F319 Bipolar disorder, unspecified: Secondary | ICD-10-CM | POA: Diagnosis present

## 2015-11-29 DIAGNOSIS — E876 Hypokalemia: Secondary | ICD-10-CM | POA: Diagnosis present

## 2015-11-29 DIAGNOSIS — I442 Atrioventricular block, complete: Secondary | ICD-10-CM | POA: Diagnosis present

## 2015-11-29 DIAGNOSIS — Z885 Allergy status to narcotic agent status: Secondary | ICD-10-CM

## 2015-11-29 DIAGNOSIS — F1010 Alcohol abuse, uncomplicated: Secondary | ICD-10-CM | POA: Diagnosis not present

## 2015-11-29 DIAGNOSIS — K121 Other forms of stomatitis: Secondary | ICD-10-CM | POA: Diagnosis present

## 2015-11-29 DIAGNOSIS — F17210 Nicotine dependence, cigarettes, uncomplicated: Secondary | ICD-10-CM | POA: Diagnosis present

## 2015-11-29 DIAGNOSIS — I480 Paroxysmal atrial fibrillation: Secondary | ICD-10-CM | POA: Diagnosis present

## 2015-11-29 DIAGNOSIS — R5381 Other malaise: Principal | ICD-10-CM | POA: Diagnosis present

## 2015-11-29 DIAGNOSIS — R52 Pain, unspecified: Secondary | ICD-10-CM

## 2015-11-29 DIAGNOSIS — Z96652 Presence of left artificial knee joint: Secondary | ICD-10-CM | POA: Diagnosis present

## 2015-11-29 DIAGNOSIS — E8809 Other disorders of plasma-protein metabolism, not elsewhere classified: Secondary | ICD-10-CM | POA: Diagnosis present

## 2015-11-29 DIAGNOSIS — G9340 Encephalopathy, unspecified: Secondary | ICD-10-CM | POA: Diagnosis not present

## 2015-11-29 DIAGNOSIS — F419 Anxiety disorder, unspecified: Secondary | ICD-10-CM | POA: Diagnosis present

## 2015-11-29 DIAGNOSIS — F3170 Bipolar disorder, currently in remission, most recent episode unspecified: Secondary | ICD-10-CM

## 2015-11-29 LAB — GLUCOSE, CAPILLARY
GLUCOSE-CAPILLARY: 92 mg/dL (ref 65–99)
GLUCOSE-CAPILLARY: 79 mg/dL (ref 65–99)
Glucose-Capillary: 99 mg/dL (ref 65–99)
Glucose-Capillary: 80 mg/dL (ref 65–99)
Glucose-Capillary: 117 mg/dL — ABNORMAL HIGH (ref 65–99)

## 2015-11-29 LAB — CBC
HCT: 29.1 % — ABNORMAL LOW (ref 39.0–52.0)
Hemoglobin: 9.5 g/dL — ABNORMAL LOW (ref 13.0–17.0)
MCH: 29.7 pg (ref 26.0–34.0)
MCHC: 32.6 g/dL (ref 30.0–36.0)
MCV: 90.9 fL (ref 78.0–100.0)
Platelets: 691 10*3/uL — ABNORMAL HIGH (ref 150–400)
RBC: 3.2 MIL/uL — ABNORMAL LOW (ref 4.22–5.81)
RDW: 14.6 % (ref 11.5–15.5)
WBC: 7.3 10*3/uL (ref 4.0–10.5)

## 2015-11-29 LAB — CREATININE, SERUM
Creatinine, Ser: 1.08 mg/dL (ref 0.61–1.24)
GFR calc Af Amer: >60 mL/min (ref >60)
GFR calc non Af Amer: >60 mL/min (ref >60)

## 2015-11-29 LAB — POTASSIUM: Potassium: 3.6 mmol/L (ref 3.5–5.1)

## 2015-11-29 LAB — BASIC METABOLIC PANEL
ANION GAP: 10 (ref 5–15)
BUN: 10 mg/dL (ref 6–20)
CALCIUM: 8 mg/dL — AB (ref 8.9–10.3)
CO2: 25 mmol/L (ref 22–32)
Chloride: 108 mmol/L (ref 101–111)
Creatinine, Ser: 0.91 mg/dL (ref 0.61–1.24)
Glucose, Bld: 96 mg/dL (ref 65–99)
Potassium: 2.8 mmol/L — ABNORMAL LOW (ref 3.5–5.1)
Sodium: 143 mmol/L (ref 135–145)

## 2015-11-29 LAB — MAGNESIUM: Magnesium: 1.9 mg/dL (ref 1.7–2.4)

## 2015-11-29 MED ORDER — VITAMIN B-1 100 MG PO TABS: 100.0000 mg | ORAL_TABLET | Freq: Every day | ORAL | Status: DC

## 2015-11-29 MED ORDER — FOLIC ACID 1 MG PO TABS: 1.0000 mg | ORAL_TABLET | Freq: Every day | ORAL | Status: DC

## 2015-11-29 MED ORDER — AMIODARONE HCL 400 MG PO TABS: 400.0000 mg | ORAL_TABLET | Freq: Two times a day (BID) | ORAL | Status: DC

## 2015-11-29 MED ORDER — POTASSIUM CHLORIDE CRYS ER 20 MEQ PO TBCR
40.0000 meq | EXTENDED_RELEASE_TABLET | ORAL | Status: AC
  Administered 2015-11-29: 40 meq via ORAL
  Filled 2015-11-29: qty 2

## 2015-11-29 MED ORDER — ASPIRIN 81 MG PO CHEW: 81.0000 mg | CHEWABLE_TABLET | Freq: Every day | ORAL | Status: DC

## 2015-11-29 MED ORDER — LAMOTRIGINE 100 MG PO TABS
200.0000 mg | ORAL_TABLET | Freq: Every day | ORAL | Status: DC
  Administered 2015-11-29 – 2015-12-10 (×12): 200 mg via ORAL
  Filled 2015-11-29 (×3): qty 2
  Filled 2015-11-29 (×3): qty 1
  Filled 2015-11-29 (×2): qty 2
  Filled 2015-11-29: qty 1
  Filled 2015-11-29: qty 2
  Filled 2015-11-29: qty 1
  Filled 2015-11-29 (×2): qty 2

## 2015-11-29 MED ORDER — ALBUTEROL SULFATE (2.5 MG/3ML) 0.083% IN NEBU: 2.5000 mg | INHALATION_SOLUTION | RESPIRATORY_TRACT | Status: DC | PRN

## 2015-11-29 MED ORDER — THIAMINE HCL 100 MG PO TABS: 100.0000 mg | ORAL_TABLET | Freq: Every day | ORAL | Status: DC

## 2015-11-29 MED ORDER — OXYCODONE-ACETAMINOPHEN 5-325 MG PO TABS
1.0000 | ORAL_TABLET | ORAL | Status: DC | PRN
  Administered 2015-11-29 – 2015-12-04 (×18): 2 via ORAL
  Administered 2015-12-04: 1 via ORAL
  Administered 2015-12-04 – 2015-12-11 (×23): 2 via ORAL
  Filled 2015-11-29 (×43): qty 2

## 2015-11-29 MED ORDER — FUROSEMIDE 40 MG PO TABS: 40.0000 mg | ORAL_TABLET | Freq: Every day | ORAL | Status: DC

## 2015-11-29 MED ORDER — POTASSIUM CHLORIDE CRYS ER 20 MEQ PO TBCR: 40.0000 meq | EXTENDED_RELEASE_TABLET | Freq: Every day | ORAL | Status: DC

## 2015-11-29 MED ORDER — ONDANSETRON HCL 4 MG/2ML IJ SOLN
4.0000 mg | Freq: Four times a day (QID) | INTRAMUSCULAR | Status: DC | PRN
  Administered 2015-12-01: 4 mg via INTRAVENOUS
  Filled 2015-11-29: qty 2

## 2015-11-29 MED ORDER — AMIODARONE HCL 200 MG PO TABS
400.0000 mg | ORAL_TABLET | Freq: Two times a day (BID) | ORAL | Status: DC
  Administered 2015-11-29 – 2015-12-11 (×24): 400 mg via ORAL
  Filled 2015-11-29 (×25): qty 2

## 2015-11-29 MED ORDER — WHITE PETROLATUM GEL: Status: DC | PRN

## 2015-11-29 MED ORDER — PANTOPRAZOLE SODIUM 40 MG PO TBEC
40.0000 mg | DELAYED_RELEASE_TABLET | Freq: Every day | ORAL | Status: DC
  Administered 2015-11-30 – 2015-12-11 (×12): 40 mg via ORAL
  Filled 2015-11-29: qty 2
  Filled 2015-11-29 (×11): qty 1

## 2015-11-29 MED ORDER — SORBITOL 70 % SOLN: 30.0000 mL | Freq: Every day | Status: DC | PRN

## 2015-11-29 MED ORDER — FOLIC ACID 1 MG PO TABS
1.0000 mg | ORAL_TABLET | Freq: Every day | ORAL | Status: DC
  Administered 2015-11-30 – 2015-12-11 (×11): 1 mg via ORAL
  Filled 2015-11-29 (×12): qty 1

## 2015-11-29 MED ORDER — QUETIAPINE FUMARATE 50 MG PO TABS: 50.0000 mg | ORAL_TABLET | Freq: Every day | ORAL | Status: DC

## 2015-11-29 MED ORDER — POTASSIUM CHLORIDE 10 MEQ/100ML IV SOLN
10.0000 meq | INTRAVENOUS | Status: AC
  Administered 2015-11-29 (×5): 10 meq via INTRAVENOUS
  Filled 2015-11-29 (×6): qty 100

## 2015-11-29 MED ORDER — FUROSEMIDE 40 MG PO TABS
40.0000 mg | ORAL_TABLET | Freq: Every day | ORAL | Status: DC
  Administered 2015-11-30 – 2015-12-11 (×12): 40 mg via ORAL
  Filled 2015-11-29 (×12): qty 1

## 2015-11-29 MED ORDER — ONDANSETRON HCL 4 MG PO TABS
4.0000 mg | ORAL_TABLET | Freq: Four times a day (QID) | ORAL | Status: DC | PRN
  Administered 2015-12-05 – 2015-12-11 (×2): 4 mg via ORAL
  Filled 2015-11-29 (×2): qty 1

## 2015-11-29 MED ORDER — POTASSIUM CHLORIDE CRYS ER 20 MEQ PO TBCR
40.0000 meq | EXTENDED_RELEASE_TABLET | ORAL | Status: DC
  Administered 2015-11-29 (×2): 40 meq via ORAL
  Filled 2015-11-29 (×2): qty 2

## 2015-11-29 MED ORDER — INSULIN ASPART 100 UNIT/ML ~~LOC~~ SOLN: 0.0000 [IU] | SUBCUTANEOUS | Status: DC

## 2015-11-29 MED ORDER — ASPIRIN 81 MG PO CHEW
81.0000 mg | CHEWABLE_TABLET | Freq: Every day | ORAL | Status: DC
  Administered 2015-11-30 – 2015-12-11 (×12): 81 mg via ORAL
  Filled 2015-11-29 (×12): qty 1

## 2015-11-29 MED ORDER — HEPARIN SODIUM (PORCINE) 5000 UNIT/ML IJ SOLN: 5000.0000 [IU] | Freq: Three times a day (TID) | INTRAMUSCULAR | Status: DC

## 2015-11-29 MED ORDER — HEPARIN SODIUM (PORCINE) 5000 UNIT/ML IJ SOLN
5000.0000 [IU] | Freq: Three times a day (TID) | INTRAMUSCULAR | Status: DC
  Administered 2015-11-29 – 2015-12-03 (×12): 5000 [IU] via SUBCUTANEOUS
  Filled 2015-11-29 (×12): qty 1

## 2015-11-29 MED ORDER — QUETIAPINE FUMARATE 50 MG PO TABS
50.0000 mg | ORAL_TABLET | Freq: Every day | ORAL | Status: DC
  Administered 2015-11-29 – 2015-12-10 (×12): 50 mg via ORAL
  Filled 2015-11-29 (×12): qty 1

## 2015-11-29 NOTE — H&P (View-Only) (Signed)
Physical Medicine and Rehabilitation Admission H&P    Chief Complaint  Patient presents with  . Hypotension  . Hematemesis  : HPI: Shawn [name] is a 65 y.o. right handed male with history of polysubstance abuse alcohol abuse, bipolar disorder, chronic systolic congestive heart failure. Patient lives alone but plans to stay with his sister who is retired. Patient independent prior to admission. One level home. Presented 11/17/2015 with 3 day history of abdominal pain with nausea and vomiting as well as constipation with fever and chills. Patient noted history of small bowel resection in the past for incarcerated femoral hernia perforation in 2013 and again in 2016. Findings of lactic acid of 12, serum creatinine 6.7. CT of abdomen and pelvis with extensive small bowel pneumatosis, mesenteric and portal venous gas. Underwent exploratory laparotomy 11/17/2015 and found to have viable gut no evidence of ischemic bowel. Wound was left open until 11/21/2015 and underwent closureWith postoperative ventilatory management. Hospital course cardiology services consulted 11/18/2015 for findings of complete heart block/STEMI with heart rate in the 30s developed A. fib with RVR. Echocardiogram with ejection fraction of 45%. Severe inferior lateral hypokinesis to akinesis. Patient did receive a temporary pacemaker for short time. Placed on intravenous amiodarone. Attempted cardioversion failed. Patient extubated 11/26/2015. Creatinine has improved to 1.04.Hypokalemia 2.8-3.2. Potassium supplement added. Acute blood loss anemia 9.4 and monitored.Completed a course of antibiotics for Klebsiella pneumonia. Subcutaneous heparin later added for DVT prophylaxis. Diet advanced to soft..Patient was admitted for a comprehensive rehabilitation program  ROS  HENT: Negative for hearing loss.  Eyes: Negative for blurred vision and double vision.  Respiratory: Positive for shortness of breath. Negative for cough.    Cardiovascular: Negative for chest pain.  Gastrointestinal: Positive for nausea, abdominal pain and constipation.  Genitourinary: Negative for dysuria and hematuria.  Musculoskeletal: Positive for myalgias and joint pain.  Skin: Negative for rash.  Neurological: Positive for weakness. Negative for seizures, loss of consciousness and headaches.  All other systems reviewed and are negative   Past Medical History  Diagnosis Date  . Osteoarthritis X years    left knee.  Distant hx (age 63) "dislocated" knee and required surgery, then crush injury to patella required knee cap removal.  . Diverticular disease     "itis" x 2 episodes (last was about 2007)  . Tobacco dependence   . Multiple rib fractures 10/09/10    s/p fall down a ravine--9th and 10th on right, contusion of left 7th and 8th ribs  . Fracture of metatarsal bone(s), closed     3rd, 4th, 5th mid shaft on left foot  . History of substance abuse     cocaine, marijuana, acid, alcohol (in remission since 2010)  . Solar dermatitis     face and ears  . Bipolar disorder [facility])     Has had multiple psych hospitalization in the past, most recently at [facility] 01/31/11-02/04/11.  Says meds made him emotionally blunted. (lamictal and wellbutrin).  . Alcoholism ([facility])     "Dry" since 2002; relapse 2014  . Depression   . Anxiety   . Vitamin B12 deficiency 10/2013    IF ab positive.  Replacement therapy started end of March 2015   Past Surgical History  Procedure Laterality Date  . Knee dislocation surgery  1966  . Patellectomy  1968    s/p crush injury  . Inguinal hernia repair      left  . Hernia repair      at 65 years  of age  48  . Total knee arthroplasty  11/20/2011    Procedure: TOTAL KNEE ARTHROPLASTY;  Surgeon: Ninetta Lights, MD;  Location: [place];  Service: Orthopedics;  Laterality: Left;  DR Percell Miller WANTS 90MINUTES FOR THIS CASE  . Inguinal hernia repair  06/15/2012    Procedure: HERNIA REPAIR INGUINAL  INCARCERATED;  Surgeon: Edward Jolly, MD;  Location: [facility];  Service: General;  Laterality: Right;  . Bowel resection  06/15/2012    Procedure: SMALL BOWEL RESECTION;  Surgeon: Edward Jolly, MD;  Location: [facility];  Service: General;  Laterality: N/A;  . Femur im nail  08/06/2012    Procedure: INTRAMEDULLARY (IM) NAIL FEMORAL;  Surgeon: Johnn Hai, MD;  Location: [facility];  Service: Orthopedics;  Laterality: Left;  . Laparotomy N/A 11/17/2015    Procedure: EXPLORATORY LAPAROTOMY;  Surgeon: Donnie Mesa, MD;  Location: [place];  Service: General;  Laterality: N/A;  . Laparotomy N/A 11/19/2015    Procedure: EXPLORATORY LAPAROTOMY OPEN ABDOMEN ABDOMINAL WOUND VAC CHANGE;  Surgeon: Ralene Ok, MD;  Location: [place];  Service: General;  Laterality: N/A;  . Bowel resection  11/19/2015    Procedure: SMALL BOWEL RESECTION;  Surgeon: Ralene Ok, MD;  Location: [facility];  Service: General;;  . Cardiac catheterization N/A 11/20/2015    Procedure: Temporary Wire;  Surgeon: Evans Lance, MD;  Location: [facility];  Service: Cardiovascular;  Laterality: N/A;  . Small bowel repair N/A 11/21/2015    Procedure: SMALL BOWEL REPAIR, SMALL BOWEL ANATAMOSIS AND ABDOMINAL CLOSURE;  Surgeon: Coralie Keens, MD;  Location: [facility];  Service: General;  Laterality: N/A;   Family History  Problem Relation Age of Onset  . Arthritis Mother   . Arthritis Father   . Cancer Brother     lung cancer.  Died in early 71s.   Social History:  reports that he has been smoking Cigarettes.  He has a 30 pack-year smoking history. He has never used smokeless tobacco. He reports that he drinks alcohol. He reports that he does not use illicit drugs. Allergies:  Allergies  Allergen Reactions  . Codeine Nausea Only and Other (See Comments)    flushing  . Depakote [Divalproex Sodium] Other (See Comments)    Makes me crazy   Medications Prior to Admission  Medication Sig Dispense Refill  . diclofenac  (VOLTAREN) 75 MG EC tablet Take 75 mg by mouth 2 (two) times daily.    Marland Kitchen dicyclomine (BENTYL) 10 MG capsule Take 10 mg by mouth 4 (four) times daily.    Marland Kitchen escitalopram (LEXAPRO) 10 MG tablet Take 10 mg by mouth at bedtime.    . folic acid (FOLVITE) 1 MG tablet Take 1 mg by mouth daily.    Marland Kitchen lamoTRIgine (LAMICTAL) 200 MG tablet Take 200 mg by mouth at bedtime.    . Multiple Vitamin (MULTIVITAMIN WITH MINERALS) TABS tablet Take 1 tablet by mouth daily.    . QUEtiapine (SEROQUEL) 300 MG tablet Take 300 mg by mouth at bedtime.    . traMADol-acetaminophen (ULTRACET) 37.5-325 MG tablet Take 1 tablet by mouth every 4 (four) hours as needed (pain).    . hydroxypropyl methylcellulose (ISOPTO TEARS) 2.5 % ophthalmic solution Place 2 drops into both eyes as needed for dry eyes.      Home: Home Living Family/patient expects to be discharged to:: Inpatient rehab Living Arrangements: Other relatives Available Help at Discharge: Family, Available 24 hours/day Type of Home: House Home Access: [place]  Layout: One level Home Equipment: None Additional Comments: lives alone, but plans to stay with his sister who is retired   Functional History: Prior Function Level of Independence: Independent  Functional Status:  Mobility: Bed Mobility General bed mobility comments: just up to chair with nursing Transfers Overall transfer level: Needs assistance Equipment used: Rolling walker (2 wheeled) Transfers: Sit to/from Stand Sit to Stand: +2 physical assistance, Mod assist General transfer comment: stood first attempt wtih +1 assist and difficulty getting upright due to anxiety, L LE weakness and poor tolerance with HR up to 123.  Second attempt up with nursing assist and mod +2 A with cues for hand placement esp on L when pt reaching for bed with L UE due to L LE weakness Ambulation/Gait General Gait Details: unable    ADL:    Cognition: Cognition Overall Cognitive Status: No  family/caregiver present to determine baseline cognitive functioning Orientation Level: Oriented to person Cognition Arousal/Alertness: Awake/alert Behavior During Therapy: Anxious Overall Cognitive Status: No family/caregiver present to determine baseline cognitive functioning  Physical Exam: Blood pressure 117/64, pulse 71, temperature 98.6 F (37 C), temperature source Oral, resp. rate 18, height '5\' 11"'$  (1.803 m), weight 76.794 kg (169 lb 4.8 oz), SpO2 94 %. Physical Exam Constitutional: He is oriented to person, place, and time. No distress.  Frail  HENT:  Head: Normocephalic.  Mouth/Throat: Oropharynx is clear and moist.  Eyes: Conjunctivae and EOM are normal.  Neck: Neck supple. No thyromegaly present.  Cardiovascular:  Cardiac rate controlled  Respiratory: Effort normal.  Decreased breath sounds at the bases  GI: Soft. Bowel sounds are normal.  Wet-to-dry dressing to abdominal wound  Musculoskeletal: He exhibits no edema or tenderness.  Neurological: He is alert and oriented to person, place, and time.  Makes good eye contact with examiner.  Follows simple commands. Sensation intact light touch Motor: Bilateral upper extremities proximally 4+/5, distally 5/5 Bilateral lower extremities: Proximally 4/5, distally 5/5  Skin: Skin is warm and dry. He is not diaphoretic.  Dressing to neck and abdomen c/d/i  Psychiatric: His behavior is normal. Thought content normal   Results for orders placed or performed during the hospital encounter of 11/17/15 (from the past 48 hour(s))  Glucose, capillary     Status: Abnormal   Collection Time: 11/26/15  7:47 AM  Result Value Ref Range   Glucose-Capillary 114 (H) 65 - 99 mg/dL   Comment 1 Capillary Specimen    Comment 2 Notify RN   Glucose, capillary     Status: Abnormal   Collection Time: 11/26/15 12:13 PM  Result Value Ref Range   Glucose-Capillary 100 (H) 65 - 99 mg/dL   Comment 1 Capillary Specimen    Comment 2 Notify RN     Glucose, capillary     Status: Abnormal   Collection Time: 11/26/15  3:41 PM  Result Value Ref Range   Glucose-Capillary 101 (H) 65 - 99 mg/dL   Comment 1 Capillary Specimen    Comment 2 Notify RN   Glucose, capillary     Status: None   Collection Time: 11/26/15  7:45 PM  Result Value Ref Range   Glucose-Capillary 98 65 - 99 mg/dL   Comment 1 Notify RN    Comment 2 Document in Chart   Glucose, capillary     Status: Abnormal   Collection Time: 11/27/15 12:19 AM  Result Value Ref Range   Glucose-Capillary 111 (H) 65 - 99 mg/dL   Comment 1 Notify RN  Comment 2 Document in Chart   Basic metabolic panel     Status: Abnormal   Collection Time: 11/27/15  3:41 AM  Result Value Ref Range   Sodium 143 135 - 145 mmol/L   Potassium 3.2 (L) 3.5 - 5.1 mmol/L   Chloride 103 101 - 111 mmol/L   CO2 30 22 - 32 mmol/L   Glucose, Bld 116 (H) 65 - 99 mg/dL   BUN 15 6 - 20 mg/dL   Creatinine, Ser 1.04 0.61 - 1.24 mg/dL   Calcium 7.8 (L) 8.9 - 10.3 mg/dL   GFR calc non Af Amer >60 >60 mL/min   GFR calc Af Amer >60 >60 mL/min    Comment: (NOTE) The eGFR has been calculated using the CKD EPI equation. This calculation has not been validated in all clinical situations. eGFR's persistently <60 mL/min signify possible Chronic Kidney Disease.    Anion gap 10 5 - 15  CBC with Differential/Platelet     Status: Abnormal   Collection Time: 11/27/15  3:41 AM  Result Value Ref Range   WBC 13.4 (H) 4.0 - 10.5 K/uL   RBC 2.92 (L) 4.22 - 5.81 MIL/uL   Hemoglobin 8.5 (L) 13.0 - 17.0 g/dL   HCT 26.2 (L) 39.0 - 52.0 %   MCV 89.7 78.0 - 100.0 fL   MCH 29.1 26.0 - 34.0 pg   MCHC 32.4 30.0 - 36.0 g/dL   RDW 14.3 11.5 - 15.5 %   Platelets 457 (H) 150 - 400 K/uL   Neutrophils Relative % 95 %   Neutro Abs 12.8 (H) 1.7 - 7.7 K/uL   Lymphocytes Relative 3 %   Lymphs Abs 0.4 (L) 0.7 - 4.0 K/uL   Monocytes Relative 2 %   Monocytes Absolute 0.2 0.1 - 1.0 K/uL   Eosinophils Relative 0 %   Eosinophils  Absolute 0.0 0.0 - 0.7 K/uL   Basophils Relative 0 %   Basophils Absolute 0.0 0.0 - 0.1 K/uL  Phosphorus     Status: None   Collection Time: 11/27/15  3:41 AM  Result Value Ref Range   Phosphorus 3.0 2.5 - 4.6 mg/dL  Magnesium     Status: None   Collection Time: 11/27/15  3:41 AM  Result Value Ref Range   Magnesium 1.7 1.7 - 2.4 mg/dL  Glucose, capillary     Status: Abnormal   Collection Time: 11/27/15  3:45 AM  Result Value Ref Range   Glucose-Capillary 110 (H) 65 - 99 mg/dL   Comment 1 Notify RN    Comment 2 Document in Chart   Glucose, capillary     Status: None   Collection Time: 11/27/15  8:39 AM  Result Value Ref Range   Glucose-Capillary 86 65 - 99 mg/dL   Comment 1 Notify RN   Glucose, capillary     Status: None   Collection Time: 11/27/15 12:16 PM  Result Value Ref Range   Glucose-Capillary 95 65 - 99 mg/dL   Comment 1 Notify RN   Glucose, capillary     Status: None   Collection Time: 11/27/15  3:56 PM  Result Value Ref Range   Glucose-Capillary 98 65 - 99 mg/dL  Glucose, capillary     Status: Abnormal   Collection Time: 11/27/15  7:27 PM  Result Value Ref Range   Glucose-Capillary 108 (H) 65 - 99 mg/dL   Comment 1 Notify RN    Comment 2 Document in Chart   Glucose, capillary     Status:  Abnormal   Collection Time: 11/28/15 12:15 AM  Result Value Ref Range   Glucose-Capillary 110 (H) 65 - 99 mg/dL  Magnesium in AM     Status: None   Collection Time: 11/28/15  3:36 AM  Result Value Ref Range   Magnesium 2.0 1.7 - 2.4 mg/dL  CBC with Differential/Platelet     Status: Abnormal   Collection Time: 11/28/15  3:36 AM  Result Value Ref Range   WBC 10.6 (H) 4.0 - 10.5 K/uL   RBC 3.22 (L) 4.22 - 5.81 MIL/uL   Hemoglobin 9.4 (L) 13.0 - 17.0 g/dL   HCT 29.3 (L) 39.0 - 52.0 %   MCV 91.0 78.0 - 100.0 fL   MCH 29.2 26.0 - 34.0 pg   MCHC 32.1 30.0 - 36.0 g/dL   RDW 14.5 11.5 - 15.5 %   Platelets 613 (H) 150 - 400 K/uL   Neutrophils Relative % 88 %   Neutro Abs 9.3  (H) 1.7 - 7.7 K/uL   Lymphocytes Relative 8 %   Lymphs Abs 0.9 0.7 - 4.0 K/uL   Monocytes Relative 4 %   Monocytes Absolute 0.4 0.1 - 1.0 K/uL   Eosinophils Relative 0 %   Eosinophils Absolute 0.0 0.0 - 0.7 K/uL   Basophils Relative 0 %   Basophils Absolute 0.0 0.0 - 0.1 K/uL  Renal function panel     Status: Abnormal   Collection Time: 11/28/15  3:36 AM  Result Value Ref Range   Sodium 144 135 - 145 mmol/L   Potassium 2.8 (L) 3.5 - 5.1 mmol/L   Chloride 105 101 - 111 mmol/L   CO2 30 22 - 32 mmol/L   Glucose, Bld 104 (H) 65 - 99 mg/dL   BUN 14 6 - 20 mg/dL   Creatinine, Ser 0.91 0.61 - 1.24 mg/dL   Calcium 7.9 (L) 8.9 - 10.3 mg/dL   Phosphorus 2.1 (L) 2.5 - 4.6 mg/dL   Albumin 1.7 (L) 3.5 - 5.0 g/dL   GFR calc non Af Amer >60 >60 mL/min   GFR calc Af Amer >60 >60 mL/min    Comment: (NOTE) The eGFR has been calculated using the CKD EPI equation. This calculation has not been validated in all clinical situations. eGFR's persistently <60 mL/min signify possible Chronic Kidney Disease.    Anion gap 9 5 - 15  Glucose, capillary     Status: None   Collection Time: 11/28/15  6:03 AM  Result Value Ref Range   Glucose-Capillary 86 65 - 99 mg/dL   Dg Chest Port 1 View  11/27/2015  CLINICAL DATA:  Pulmonary edema EXAM: PORTABLE CHEST 1 VIEW COMPARISON:  November 26, 2015 FINDINGS: Endotracheal tube and nasogastric tube have been removed. Central catheter tip is in the superior vena cava. No pneumothorax. There is persistent consolidation in the left base with small left effusion. There has been interval clearing of airspace opacity from the right base. Currently there is a slight degree of underlying interstitial pulmonary edema. Heart is upper normal in size with pulmonary vascularity within normal limits. IMPRESSION: Trace interstitial edema. Airspace consolidation left base with small left effusion. Suspect a degree of pneumonia in the left base region. Airspace opacity from the right base  has cleared. No change in cardiac silhouette. Electronically Signed   By: Lowella Grip III M.D.   On: 11/27/2015 07:37       Medical Problem List and Plan: 1.  Debilitation secondary to respiratory failure/small bowel repair/septic shock Burman Blacksmith pneumonia /multiple  medical 2.  DVT Prophylaxis/Anticoagulation: Subcutaneous heparin. Monitor for any bleeding episode 3. Pain Management: Oxycodone as needed 4. Acute blood loss anemia. Follow-up CBC 5. Neuropsych: This patient is capable of making decisions on his own behalf. 6. Skin/Wound Care: Routine skin checks 7. Fluids/Electrolytes/Nutrition: Routine I&O with follow-up chemistries 8. A. fib with RVR/complete heart block. Continue amiodarone.Cardiac rate controlled. 9. Acute on chronic systolic congestive heart failure. Monitor for any signs of fluid overload. Continue Lasix 40 mg daily as directed 10. Adrenal insufficiency. Weaning Solu-Cortef 11. Mood/bipolar disorder.  Lamictal 200 mg daily at bedtime as well as Seroquel 50 mg daily at bedtime and titrate up the home dose of 300 mg as needed. 12. Decreased nutritional storage. Advance diet as tolerated. 13. Alcohol polysubstance abuse. Counseling. Monitor for any signs of withdrawal 14. Hypokalemia. Continue potassium supplement. Follow-up chemistries   Post Admission Physician Evaluation: 1. Functional deficits secondary  to respiratory failure/small bowel repair/septic shock Burman Blacksmith pneumonia /multiple medical. 2. Patient is admitted to receive collaborative, interdisciplinary care between the physiatrist, rehab nursing staff, and therapy team. 3. Patient's level of medical complexity and substantial therapy needs in context of that medical necessity cannot be provided at a lesser intensity of care such as a SNF. 4. Patient has experienced substantial functional loss from his/her baseline which was documented above under the "Functional History" and "Functional Status"  headings.  Judging by the patient's diagnosis, physical exam, and functional history, the patient has potential for functional progress which will result in measurable gains while on inpatient rehab.  These gains will be of substantial and practical use upon discharge  in facilitating mobility and self-care at the household level. 5. Physiatrist will provide 24 hour management of medical needs as well as oversight of the therapy plan/treatment and provide guidance as appropriate regarding the interaction of the two. 6. 24 hour rehab nursing will assist with bladder management, safety, skin/wound care, disease management, medication administration and patient education and help integrate therapy concepts, techniques,education, etc. 7. PT will assess and treat for/with: Lower extremity strength, range of motion, stamina, balance, functional mobility, safety, adaptive techniques and equipment, woundcare, coping skills, pain control, education.   Goals are: Supervision/Min A. 8. OT will assess and treat for/with: ADL's, functional mobility, safety, upper extremity strength, adaptive techniques and equipment, wound mgt, ego support, and community reintegration.   Goals are: Supervision/MinA. Therapy may not proceed with showering this patient. 9. SLP will assess and treat for/with: swallowing.  Goals are: Mod I/Ind. 10. Case Management and Social Worker will assess and treat for psychological issues and discharge planning. 11. Team conference will be held weekly to assess progress toward goals and to determine barriers to discharge. 12. Patient will receive at least 3 hours of therapy per day at least 5 days per week. 13. ELOS: 12-15 days.       14. Prognosis:  good  Delice Lesch, MD  11/28/2015

## 2015-11-29 NOTE — PMR Pre-admission (Signed)
PMR Admission Coordinator Pre-Admission Assessment  Patient: Shawn [name] is an 65 y.o., male MRN: [id number] DOB: Jan 15, 1951 Height: '5\' 11"'$  (180.3 cm) Weight: 76.794 kg (169 lb 4.8 oz)              Insurance Information HMO: No    PPO:       PCP:       IPA:       80/20:       OTHER:   PRIMARY: Medicare A/B      Policy#: [id number]      Subscriber: Adriana Simas CM Name:        Phone#:       Fax#:   Pre-Cert#:        Employer: Retired Benefits:  Phone #:       Name: Checked in [place]. Date: 10/03/13     Deduct: $1316      Out of Pocket Max: none      Life Max: unlimited CIR: 100%      SNF: 100 days Outpatient: 80%     Co-Pay: 20% Home Health: 100%      Co-Pay: none DME: 80%     Co-Pay: 20% Providers: patient's choice  Medicaid Application Date:        Case Manager:   Disability Application Date:        Case Worker:    Emergency Contact Information Contact Information    Name Relation Home Work [place] Son [phone]  [phone]   Elwin Sleight Sister   [phone]     Current Medical History  Patient Admitting Diagnosis:  Debility  History of Present Illness: A 65 y.o. right handed male with history of polysubstance abuse, alcohol abuse, bipolar disorder, chronic systolic congestive heart failure. Patient lives alone but plans to stay with his sister who is retired. Patient independent prior to admission. One level home. Presented 11/17/2015 with 3 day history of abdominal pain with nausea and vomiting as well as constipation with fever and chills. Patient noted history of small bowel resection in the past for incarcerated femoral hernia perforation in 2013 and again in 2016. Findings of lactic acid of 12, serum creatinine 6.7. CT of abdomen and pelvis with extensive small bowel pneumatosis, mesenteric and portal venous gas. Underwent exploratory laparotomy 11/17/2015 and found to have viable gut no evidence of ischemic bowel. Wound was left open  until 11/21/2015 and underwent closure with postoperative ventilatory management. Hospital course cardiology services consulted 11/18/2015 for findings of complete heart block/STEMI with heart rate in the 30s. Developed A. fib with RVR. Echocardiogram with ejection fraction of 45%. Severe inferior lateral hypokinesis to akinesis. Patient did receive a temporary pacemaker for short time. Placed on intravenous amiodarone. Attempted cardioversion failed. Patient extubated 11/26/2015. Creatinine has improved to 1.04. Acute blood loss anemia 9.4 and monitored. Completed a course of antibiotics for Klebsiella pneumonia. Subcutaneous heparin later added for DVT prophylaxis. K+ 2.8 and attending MD addressing results today.  Currently on a Full liquid diet and advanced as tolerated.  Patient to be admitted for a comprehensive inpatient rehabilitation program.    Past Medical History  Past Medical History  Diagnosis Date  . Osteoarthritis X years    left knee.  Distant hx (age 81) "dislocated" knee and required surgery, then crush injury to patella required knee cap removal.  . Diverticular disease     "itis" x 2 episodes (last was about 2007)  . Tobacco dependence   . Multiple rib fractures  10/09/10    s/p fall down a ravine--9th and 10th on right, contusion of left 7th and 8th ribs  . Fracture of metatarsal bone(s), closed     3rd, 4th, 5th mid shaft on left foot  . History of substance abuse     cocaine, marijuana, acid, alcohol (in remission since 2010)  . Solar dermatitis     face and ears  . Bipolar disorder [facility])     Has had multiple psych hospitalization in the past, most recently at [facility] 01/31/11-02/04/11.  Says meds made him emotionally blunted. (lamictal and wellbutrin).  . Alcoholism ([place])     "Dry" since 2002; relapse 2014  . Anxiety and depression   . Vitamin B12 deficiency 10/2013    IF ab positive.  Replacement therapy started end of March 2015  . History of septic shock  11/2015    ? due to small bowel ischemia: hospitalized + surgeries 11/2015  . Anxiety     Family History  family history includes Arthritis in his father and mother; Cancer in his brother.  Prior Rehab/Hospitalizations: Had therapy after a leg fracture a couple years ago and had a TKR about 5 years ago with therapy post op.  Has the patient had major surgery during 100 days prior to admission? Unknown.  Son reports hernia surgery recently, but not sure how long ago.  Current Medications   Current facility-administered medications:  .  acetaminophen (TYLENOL) tablet 650 mg, 650 mg, Oral, Q4H PRN, Evans Lance, MD .  albuterol (PROVENTIL) (2.5 MG/3ML) 0.083% nebulizer solution 2.5 mg, 2.5 mg, Nebulization, Q3H PRN, Donita Brooks, NP .  amiodarone (PACERONE) tablet 400 mg, 400 mg, Oral, BID, Javier Glazier, MD, 400 mg at 11/28/15 2235 .  antiseptic oral rinse solution (CORINZ), 7 mL, Mouth Rinse, QID, Juanito Doom, MD, 7 mL at 11/28/15 1600 .  aspirin chewable tablet 81 mg, 81 mg, Oral, Daily, Javier Glazier, MD, 81 mg at 11/28/15 0957 .  chlorhexidine gluconate (SAGE KIT) (PERIDEX) 0.12 % solution 15 mL, 15 mL, Mouth Rinse, BID, Juanito Doom, MD, 15 mL at 11/29/15 0857 .  feeding supplement (BOOST / RESOURCE BREEZE) liquid 1 Container, 1 Container, Oral, TID BM, Brand Males, MD, 1 Container at 11/28/15 2235 .  furosemide (LASIX) tablet 40 mg, 40 mg, Oral, Daily, Javier Glazier, MD, 40 mg at 11/28/15 0957 .  Gerhardt's butt cream, , Topical, PRN, Brand Males, MD, 1 application at 09/38/18 1002 .  heparin injection 5,000 Units, 5,000 Units, Subcutaneous, 3 times per day, Raylene Miyamoto, MD, 5,000 Units at 11/29/15 [phone] .  hydrocortisone sodium succinate (SOLU-CORTEF) 100 MG injection 50 mg, 50 mg, Intravenous, Daily, Thurnell Lose, MD .  insulin aspart (novoLOG) injection 0-9 Units, 0-9 Units, Subcutaneous, 6 times per day, Juanito Doom, MD, 1 Units at  11/26/15 0335 .  lamoTRIgine (LAMICTAL) tablet 200 mg, 200 mg, Oral, QHS, Thurnell Lose, MD, 200 mg at 11/28/15 2234 .  ondansetron (ZOFRAN) injection 4 mg, 4 mg, Intravenous, Q6H PRN, Evans Lance, MD, 4 mg at 11/29/15 0649 .  ondansetron (ZOFRAN-ODT) disintegrating tablet 4 mg, 4 mg, Oral, Q6H PRN **OR** [DISCONTINUED] ondansetron (ZOFRAN) injection 4 mg, 4 mg, Intravenous, Q6H PRN, Greer Pickerel, MD .  oxyCODONE-acetaminophen (PERCOCET/ROXICET) 5-325 MG per tablet 1-2 tablet, 1-2 tablet, Oral, Q4H PRN, Judeth Horn, MD, 2 tablet at 11/29/15 [phone] .  pantoprazole (PROTONIX) EC tablet 40 mg, 40 mg, Oral, Daily, Sonia Baller  Ashok Cordia, MD, 40 mg at 11/28/15 0957 .  potassium chloride 10 mEq in 100 mL IVPB, 10 mEq, Intravenous, Q1 Hr x 6, Thurnell Lose, MD, 10 mEq at 11/29/15 1106 .  potassium chloride SA (K-DUR,KLOR-CON) CR tablet 40 mEq, 40 mEq, Oral, Q4H, Thurnell Lose, MD, 40 mEq at 11/29/15 1023 .  QUEtiapine (SEROQUEL) tablet 50 mg, 50 mg, Oral, QHS, Thurnell Lose, MD, 50 mg at 11/28/15 2235 .  white petrolatum (VASELINE) gel, , Topical, PRN, Grace Bushy Minor, NP, 0.2 application at 76/54/65 0441  Patients Current Diet: DIET SOFT Room service appropriate?: Yes; Fluid consistency:: Thin  Precautions / Restrictions Precautions Precautions: Fall Precaution Comments: watch HR Restrictions Weight Bearing Restrictions: No   Has the patient had 2 or more falls or a fall with injury in the past year?Yes.  Several falls and was not able to get up after the last fall.  Prior Activity Level Limited Community (1-2x/wk): Went out 1-2 X a week.  Walked up to nearby church to see service schedule.  Home Assistive Devices / Equipment Home Assistive Devices/Equipment: Cane (specify quad or straight), Walker (specify type) Home Equipment: None  Prior Device Use: Indicate devices/aids used by the patient prior to current illness, exacerbation or injury? Cane  Prior Functional Level Prior  Function Level of Independence: Independent  Self Care: Did the patient need help bathing, dressing, using the toilet or eating?  Independent  Indoor Mobility: Did the patient need assistance with walking from room to room (with or without device)? Independent  Stairs: Did the patient need assistance with internal or external stairs (with or without device)? Independent  Functional Cognition: Did the patient need help planning regular tasks such as shopping or remembering to take medications? Independent  Current Functional Level Cognition  Overall Cognitive Status: No family/caregiver present to determine baseline cognitive functioning Orientation Level: Oriented to person, Disoriented to place, Disoriented to time, Disoriented to situation    Extremity Assessment (includes Sensation/Coordination)  Upper Extremity Assessment: Generalized weakness  Lower Extremity Assessment: LLE deficits/detail LLE Deficits / Details: h/o surgery with multiple scars, noted leg length discrepancy (pt reports doesn't wear built up shoe); AAROM grossly limited knee flexion to about 80, strength hip flexion 3+/5, knee extension 3-/5    ADLs       Mobility  Overal bed mobility: Needs Assistance Bed Mobility: Supine to Sit Supine to sit: Mod assist General bed mobility comments: cues to roll and use rail and even with assist comes up straight, assist for legs and trunk    Transfers  Overall transfer level: Needs assistance Equipment used: Rolling walker (2 wheeled) Transfers: Sit to/from Stand, [employer] Transfers Sit to Stand: Mod assist, +2 physical assistance Stand pivot transfers: Mod assist, +2 physical assistance General transfer comment: assist from bed increased time and cues for hand placement, continued difficulty getting L LE under him; able to stand step to recliner with +2 A and increased time with walker, but fatigued quickly requesting to sit prior to lining up with chair so assisted  for safety    Ambulation / Gait / Stairs / Wheelchair Mobility  Ambulation/Gait General Gait Details: unable    Posture / Balance Dynamic Sitting Balance Sitting balance - Comments: posterior bias initially in sitting Balance Overall balance assessment: Needs assistance Sitting-balance support: Bilateral upper extremity supported, Feet supported Sitting balance-Leahy Scale: Poor Sitting balance - Comments: posterior bias initially in sitting Standing balance support: Bilateral upper extremity supported Standing balance-Leahy Scale: Poor Standing balance  comment: UE support and +2 A for balance     Special needs/care consideration BiPAP/CPAP No CPM No Continuous Drip IV No Dialysis No        Life Vest No Oxygen NO Special Bed No Trach Size No Wound Vac (area) No       Skin Abdominal incision, groin incision, wound on buttocks                            Bowel mgmt: Last BM 11/28/15 with incontinence Bladder mgmt: Voids with incontinence Diabetic mgmt No    Previous Home Environment Living Arrangements: Alone Available Help at Discharge: Family, Available 24 hours/day Type of Home: House Home Layout: One level Home Access: Ramped entrance Home Care Services: No Additional Comments: lives alone, but plans to stay with his sister who is retired  Nurse, learning disability for Discharge Living Setting: House, Lives with (comment) (Newly living with son for 1-2 weeks PTA.) Note: Patient says he is going home with sister, but son has no knowledge of this and sister has not returned my calls.  Son reports that patient was in a halfway house and had only been with son for 1-2 weeks. Type of Home at Discharge: House Discharge Home Layout: One level Discharge Home Access: Stairs to enter Entrance Stairs-Number of Steps: 1 step to back porch and 1 step into kitchen Does the patient have any problems obtaining your medications?: No  Social/Family/Support Systems Patient Roles:  Parent, Other (Comment) (Has a son, daughter and a sister.) Contact Information: Muneer Leider - son Anticipated Caregiver: Son Anticipated Caregiver's Contact Information: tommy  [phone]  Ability/Limitations of Caregiver: Son has muscular dystrophy and can provide supervision but not physical assistance.  Daughter in [place].  Sister is in [place]. Caregiver Availability: 24/7 Discharge Plan Discussed with Primary Caregiver: Yes Is Caregiver In Agreement with Plan?: Yes Does Caregiver/Family have Issues with Lodging/Transportation while Pt is in Rehab?: No  Goals/Additional Needs Patient/Family Goal for Rehab: PT/OT supervision and min assist, ST mod I and I goals Expected length of stay: 12-15 days Cultural Considerations: Christian Dietary Needs: Full liquids, thin liquids Equipment Needs: TBD Additional Information: son reports having a difficult time with dad's illness.  Son is currently staying with friends so that he can be closer to hospital.  Friends are very supportive to son emotionally.  Son is aggrevated with patient's current mental confusion. Pt/Family Agrees to Admission and willing to participate: Yes (Son limited due to muscular dystrophy.) Program Orientation Provided & Reviewed with Pt/Caregiver Including Roles  & Responsibilities: Yes  Decrease burden of Care through IP rehab admission: N/A  Possible need for SNF placement upon discharge: Yes, if patient does not progress to functional level where family can manage at home.  Patient Condition: This patient's medical and functional status has changed since the consult dated: 11/27/15 in which the Rehabilitation Physician determined and documented that the patient's condition is appropriate for intensive rehabilitative care in an inpatient rehabilitation facility. See "History of Present Illness" (above) for medical update. Functional changes are: Currently requiring mod assist for stand pivot transfers. Patient's  medical and functional status update has been discussed with the Rehabilitation physician and patient remains appropriate for inpatient rehabilitation. Will admit to inpatient rehab today.  Preadmission Screen Completed By:  Retta Diones, 11/29/2015 11:10 AM ______________________________________________________________________   Discussed status with Dr. Posey Pronto on 11/29/15 at 1105 and received telephone approval for admission today.  Admission Coordinator:  Retta Diones, time1105/Date03/29/17

## 2015-11-29 NOTE — Discharge Summary (Signed)
Shawn [name], is a 65 y.o. male  DOB 1950/11/09  MRN [id number].  Admission date:  11/17/2015  Admitting Physician  Donnie Mesa, MD  Discharge Date:  11/29/2015   Primary MD  Tammi Sou, MD  Recommendations for primary care physician for things to follow:   Kindly check BMP and magnesium tomorrow  Repeat CBC, BMP, two-view chest x-ray in a week   Admission Diagnosis  GI bleed [K92.2] Mesenteric ischemia (HCC) [K55.9]   Discharge Diagnosis  GI bleed [K92.2] Mesenteric ischemia ([place]) [K55.9]    Principal Problem:   Septic shock ([place]) Active Problems:   Pressure ulcer   Complete heart block (HCC)   Malnutrition of moderate degree   Cardiomyopathy, ischemic   Acute respiratory failure with hypoxemia (HCC)   Acute on chronic combined systolic and diastolic CHF (congestive heart failure) (HCC)   Pneumatosis coli   Persistent atrial fibrillation (HCC)   Acute respiratory failure (HCC)   Mesenteric ischemia (HCC)   Polysubstance abuse   Bipolar affective disorder in remission ([place])   History of ventilator dependency (HCC)   AKI (acute kidney injury) ([place])   Dysphagia   Tachycardia   Tachypnea   Hypokalemia   Leukocytosis   Acute blood loss anemia      Past Medical History  Diagnosis Date  . Osteoarthritis X years    left knee.  Distant hx (age 65) "dislocated" knee and required surgery, then crush injury to patella required knee cap removal.  . Diverticular disease     "itis" x 2 episodes (last was about 2007)  . Tobacco dependence   . Multiple rib fractures 10/09/10    s/p fall down a ravine--9th and 10th on right, contusion of left 7th and 8th ribs  . Fracture of metatarsal bone(s), closed     3rd, 4th, 5th mid shaft on left foot  . History of substance abuse     cocaine, marijuana,  acid, alcohol (in remission since 2010)  . Solar dermatitis     face and ears  . Bipolar disorder [facility])     Has had multiple psych hospitalization in the past, most recently at [facility] 01/31/11-02/04/11.  Says meds made him emotionally blunted. (lamictal and wellbutrin).  . Alcoholism ([place])     "Dry" since 2002; relapse 2014  . Anxiety and depression   . Vitamin B12 deficiency 10/2013    IF ab positive.  Replacement therapy started end of March 2015  . History of septic shock 11/2015    ? due to small bowel ischemia: hospitalized + surgeries 11/2015  . Anxiety     Past Surgical History  Procedure Laterality Date  . Knee dislocation surgery  1966  . Patellectomy  1968    s/p crush injury  . Inguinal hernia repair      left  . Hernia repair      at 65 years of age  55  . Total knee arthroplasty  11/20/2011    Procedure: TOTAL KNEE ARTHROPLASTY;  Surgeon: Nestor Ramp  Percell Miller, MD;  Location: [facility];  Service: Orthopedics;  Laterality: Left;  DR Percell Miller WANTS 90MINUTES FOR THIS CASE  . Inguinal hernia repair  06/15/2012    Procedure: HERNIA REPAIR INGUINAL INCARCERATED;  Surgeon: Edward Jolly, MD;  Location: [facility];  Service: General;  Laterality: Right;  . Bowel resection  06/15/2012    Procedure: SMALL BOWEL RESECTION;  Surgeon: Edward Jolly, MD;  Location: [facility];  Service: General;  Laterality: N/A;  . Femur im nail  08/06/2012    Procedure: INTRAMEDULLARY (IM) NAIL FEMORAL;  Surgeon: Johnn Hai, MD;  Location: [facility];  Service: Orthopedics;  Laterality: Left;  . Laparotomy N/A 11/17/2015    Procedure: EXPLORATORY LAPAROTOMY;  Surgeon: Donnie Mesa, MD;  Location: [place] [facility];  Service: General;  Laterality: N/A;  . Laparotomy N/A 11/19/2015    Procedure: EXPLORATORY LAPAROTOMY OPEN ABDOMEN ABDOMINAL WOUND VAC CHANGE;  Surgeon: Ralene Ok, MD;  Location: [facility];  Service: General;  Laterality: N/A;  . Bowel resection  11/19/2015    Procedure: SMALL BOWEL RESECTION;   Surgeon: Ralene Ok, MD;  Location: [facility];  Service: General;;  . Cardiac catheterization N/A 11/20/2015    Procedure: Temporary Wire;  Surgeon: Evans Lance, MD;  Location: [facility];  Service: Cardiovascular;  Laterality: N/A;  . Small bowel repair N/A 11/21/2015    Procedure: SMALL BOWEL REPAIR, SMALL BOWEL ANATAMOSIS AND ABDOMINAL CLOSURE;  Surgeon: Coralie Keens, MD;  Location: [facility];  Service: General;  Laterality: N/A;       HPI  from the history and physical done on the day of admission:    65 year old male with a PMH of ETOH, tobacco abuse and bipolar disorder who presented to the emergency department on 3/17 with a 3 day hx of abdominal pain, nausea, vomiting and constipation. Found to have CT concerning for diffuse abdominal pneumatosis and elevated lactic acid. Patient was taken to OR for ex-lap, found to have viable gut, diffuse pneumatosis (no exact cause) & no evidence of dead bowel. He was left open and taken to the MICU. He developed CHB on 3/18 and MI. Later 3/19, was taken to OR for wound closure but developed CHB in OR and VAC was applied. Subsequently taken to cath lab 3/20 for temporary pacemaker insertion.   He was transferred to hospitalist service on 11/28/2015 on day 11 of his hospital stay, he has finished all his antibiotics and currently on no antibiotics or antifungal's.       Hospital Course:    1. Septic shock due to ? small bowel ischemia requiring laparotomy with bowel resection and anastomosis, has finished antifungal and antibiotic treatments. Sepsis has resolved. Was under critical care service now transferred to hospitalist on 11/28/2015.Seen by general surgery today advanced to full diet. Monitor clinically.  2. Postoperative acute hypoxic respiratory failure requiring intubation and mechanical ventilation, complicated by Klebsiella pneumonia. Now on nasal cannula, finished all antibiotic treatment, continue supportive care with  oxygen and nebulizer treatments, and it IS and flutter valve.  3. Complete heart block with runs of atrial fibrillation. Question sick sinus syndrome. Seen by cardiology, required temporary pacemaker which has been discontinued, currently on amiodarone and aspirin combination per cardiology.  4. Acute on chronic systolic heart failure. EF 45%. Now close to compensated, continue home dose Lasix, no other medications, seen by cardiology.  5. Hypokalemia. Replaced with 60 mg IV and 80 mg orally, request SNF/CIR staff to recheck BMP and magnesium on 11/30/2015.  6.  Moderate protein calorie malnutrition. Place on protein supplementation.  7. Acute blood loss stated anemia during the perioperative period. Stable no transfusion needed.  8. Delirium. Started on Seroquel but much less than home dose, also started on home dose Lamictal. Also discussed his care with his son on 11/28/2015, patient has long-standing mild delirium at home, he was worked really homeless up till about 10 days to admission when he moved with his son, history of alcohol abuse, intermittent delirium even at home. Supportive care. Place on Foley catheter and thiamine.  9. Leukocytosis. He was on steroids in the ICU, trending down, monitor, afebrile.  10. Steroid-induced hyperglycemia. Now off steroids  11. Relative adrenal insufficiency in ICU. Her pressure stable weaned off of steroids today.     Discharge Condition: Fair  Follow UP  Follow-up Information    Follow up with MCGOWEN,PHILIP H, MD. Schedule an appointment as soon as possible for a visit in 1 week.   Specialty:  Family Medicine   Contact information:   [address] [place] [address] [phone]       Follow up with Judeth Horn, MD. Schedule an appointment as soon as possible for a visit in 1 week.   Specialty:  General Surgery   Contact information:   [address] [place] [address] [phone]        Consults  obtained - CCS, PCCM, Cards  Diet and Activity recommendation: See Discharge Instructions below  Discharge Instructions           Discharge Instructions    Discharge instructions    Complete by:  As directed   Follow with Primary MD MCGOWEN,PHILIP H, MD in 7 days   Get BMP, Magnesium tomorrow.    Activity: As tolerated with Full fall precautions use walker/cane & assistance as needed   Disposition CIR/SNF   Diet:   Soft , with feeding assistance and aspiration precautions.  For Heart failure patients - Check your Weight same time everyday, if you gain over 2 pounds, or you develop in leg swelling, experience more shortness of breath or chest pain, call your Primary MD immediately. Follow Cardiac Low Salt Diet and 1.5 lit/day fluid restriction.   On your next visit with your primary care physician please Get Medicines reviewed and adjusted.   Please request your Prim.MD to go over all Hospital Tests and Procedure/Radiological results at the follow up, please get all Hospital records sent to your Prim MD by signing hospital release before you go home.   If you experience worsening of your admission symptoms, develop shortness of breath, life threatening emergency, suicidal or homicidal thoughts you must seek medical attention immediately by calling 911 or calling your MD immediately  if symptoms less severe.  You Must read complete instructions/literature along with all the possible adverse reactions/side effects for all the Medicines you take and that have been prescribed to you. Take any new Medicines after you have completely understood and accpet all the possible adverse reactions/side effects.   Do not drive, operating heavy machinery, perform activities at heights, swimming or participation in water activities or provide baby sitting services if your were admitted for syncope or siezures until you have seen by Primary MD or a Neurologist and advised to do so again.  Do not  drive when taking Pain medications.    Do not take more than prescribed Pain, Sleep and Anxiety Medications  Special Instructions: If you have smoked or chewed Tobacco  in the last 2 yrs please stop smoking, stop any regular Alcohol  and or any Recreational drug use.  Wear Seat belts while driving.   Please note  You were cared for by a hospitalist during your hospital stay. If you have any questions about your discharge medications or the care you received while you were in the hospital after you are discharged, you can call the unit and asked to speak with the hospitalist on call if the hospitalist that took care of you is not available. Once you are discharged, your primary care physician will handle any further medical issues. Please note that NO REFILLS for any discharge medications will be authorized once you are discharged, as it is imperative that you return to your primary care physician (or establish a relationship with a primary care physician if you do not have one) for your aftercare needs so that they can reassess your need for medications and monitor your lab values.     Increase activity slowly    Complete by:  As directed              Discharge Medications       Medication List    STOP taking these medications        diclofenac 75 MG EC tablet  Commonly known as:  VOLTAREN     traMADol-acetaminophen 37.5-325 MG tablet  Commonly known as:  ULTRACET      TAKE these medications        albuterol (2.5 MG/3ML) 0.083% nebulizer solution  Commonly known as:  PROVENTIL  Take 3 mLs (2.5 mg total) by nebulization every 3 (three) hours as needed for wheezing.     amiodarone 400 MG tablet  Commonly known as:  PACERONE  Take 1 tablet (400 mg total) by mouth 2 (two) times daily.     aspirin 81 MG chewable tablet  Chew 1 tablet (81 mg total) by mouth daily.     dicyclomine 10 MG capsule  Commonly known as:  BENTYL  Take 10 mg by mouth 4 (four) times daily.      escitalopram 10 MG tablet  Commonly known as:  LEXAPRO  Take 10 mg by mouth at bedtime.     folic acid 1 MG tablet  Commonly known as:  FOLVITE  Take 1 mg by mouth daily.     furosemide 40 MG tablet  Commonly known as:  LASIX  Take 1 tablet (40 mg total) by mouth daily.     hydroxypropyl methylcellulose / hypromellose 2.5 % ophthalmic solution  Commonly known as:  ISOPTO TEARS / GONIOVISC  Place 2 drops into both eyes as needed for dry eyes.     lamoTRIgine 200 MG tablet  Commonly known as:  LAMICTAL  Take 200 mg by mouth at bedtime.     multivitamin with minerals Tabs tablet  Take 1 tablet by mouth daily.     potassium chloride SA 20 MEQ tablet  Commonly known as:  K-DUR,KLOR-CON  Take 2 tablets (40 mEq total) by mouth daily.     QUEtiapine 50 MG tablet  Commonly known as:  SEROQUEL  Take 1 tablet (50 mg total) by mouth at bedtime.     thiamine 100 MG tablet  Take 1 tablet (100 mg total) by mouth daily.        Major procedures, Radiology Reports & Events - PLEASE review detailed and final reports for all details, in brief -    3/17 Admission, exploratory laparotomy 3/18 Complete  heart block 3/18 MI - EF 45% on echo 3/19 OR for wound closure 3/20 Cath lab for temp pacemaker  3/20 - RT reports pt returned from cath lab after temporary pacemaker insertion to holding, pending transfer to ICU. Pt remains febrile to 101, requiring 12 mcg levophed  11/21/15 - EP reports acc jnl but has cleared patient for surgery.  11/22/15 - s/o small bowel anast and abd closure yesterday .  11/23/15 - pacer dc'ed by cards yesterday.    TTE  Very technically diffult study as patient is immobile on the ventilator. LVEF is reduced to 40-45% with inferior and lateral severe hypokinesis to akinesis. The IVC is dilated in the setting of positive pressure ventilation. The RV is mildly dilated with reduced systolic function. There is no pericrdial effusion.   Ct Abdomen Pelvis Wo  Contrast  11/17/2015  CLINICAL DATA:  Acute abdominal pain, nausea and vomiting for 2 days. Elevated lactate. EXAM: CT ABDOMEN AND PELVIS WITHOUT CONTRAST TECHNIQUE: Multidetector CT imaging of the abdomen and pelvis was performed following the standard protocol without IV contrast. COMPARISON:  12/08/2013 FINDINGS: Lower chest:  Minor bibasilar atelectasis versus scarring. Hepatobiliary: Scattered areas of portal venous gas throughout the right and left hepatic lobes. No biliary dilatation. Incidental gallstones layer in the gallbladder, image 39. Pancreas: Diffusely thin atrophic. No ductal dilatation or focal abnormality by noncontrast imaging. Spleen: Within normal limits in size. Adrenals/Urinary Tract: Normal adrenal glands. No hydronephrosis or obstructing ureteral calculus on either side. Ureters are decompressed and symmetric. Left kidney demonstrates a hypodense 2 cm renal cyst in the lower pole medially, image 47. Upper pole left renal cyst anteriorly measures 10 mm, image 34. Stomach/Bowel: Stomach is moderately distended. Beginning with the fourth portion of the duodenum and involving several dilated loops of jejunum there is extensive small bowel pneumatosis throughout the entire abdomen. There is some sparing of the distal ileum. There is diffuse edema throughout the central mesentery and scattered foci of central mesenteric venous gas noted throughout the mesenteric veins. Findings compatible with small bowel ischemia and associated ileus. Colon is relatively decompressed but contains air. Minor scattered colonic diverticulosis most pronounced in the sigmoid. No fluid collection or abscess. No definite pneumoperitoneum. Vascular/Lymphatic: Extensive atherosclerosis of the aorta and abdominal vasculature. No aneurysm or retroperitoneal hemorrhage. No significant adenopathy. Reproductive: No mass or other significant abnormality. Other: Right hip fixation hardware creates artifact. No inguinal  abnormality or hernia. Small fat containing umbilical hernia evident. Postop changes from inguinal hernia repairs without recurrence. Musculoskeletal: Bones are osteopenic. Degenerative changes of the spine. No acute osseous finding. IMPRESSION: Diffuse extensive small bowel pneumatosis with mild dilatation throughout the abdomen and pelvis involving the distal duodenum and the jejunum with some sparing of the ileum compatible with small bowel ischemia. Diffuse central mesenteric and portal venous gas with central mesenteric edema/congestion. Associated diffuse small bowel ileus Extensive abdominal and aortic atherosclerosis No significant pneumoperitoneum or abscess Cholelithiasis Colonic diverticulosis These results were called by telephone at the time of interpretation on 11/17/2015 at 3:15 pm to Dr. Ezequiel Essex , who verbally acknowledged these results. Electronically Signed   By: Jerilynn Mages.  Shick M.D.   On: 11/17/2015 15:19   Dg Chest Port 1 View  11/27/2015  CLINICAL DATA:  Pulmonary edema EXAM: PORTABLE CHEST 1 VIEW COMPARISON:  November 26, 2015 FINDINGS: Endotracheal tube and nasogastric tube have been removed. Central catheter tip is in the superior vena cava. No pneumothorax. There is persistent consolidation in the left base with small  left effusion. There has been interval clearing of airspace opacity from the right base. Currently there is a slight degree of underlying interstitial pulmonary edema. Heart is upper normal in size with pulmonary vascularity within normal limits. IMPRESSION: Trace interstitial edema. Airspace consolidation left base with small left effusion. Suspect a degree of pneumonia in the left base region. Airspace opacity from the right base has cleared. No change in cardiac silhouette. Electronically Signed   By: Lowella Grip III M.D.   On: 11/27/2015 07:37   Dg Chest Port 1 View  11/26/2015  CLINICAL DATA:  Pulmonary edema EXAM: PORTABLE CHEST 1 VIEW COMPARISON:  11/24/2015  FINDINGS: Cardiomediastinal silhouette is stable. Endotracheal tube in place with tip 3.9 cm above the carina. Stable right IJ central line with tip in SVC. Stable NG tube position. No pulmonary edema. Persistent hazy bilateral basilar atelectasis or infiltrate right greater than left. Slight improvement in aeration in upper lungs. IMPRESSION: Stable support apparatus. Slight improvement in aeration in upper lungs. Persistent bilateral basilar hazy atelectasis or infiltrate right greater than left. No pulmonary edema. Electronically Signed   By: Lahoma Crocker M.D.   On: 11/26/2015 09:48   Dg Chest Port 1 View  11/24/2015  CLINICAL DATA:  Check endotracheal tube placement EXAM: PORTABLE CHEST 1 VIEW COMPARISON:  11/23/2015 FINDINGS: Cardiac shadow is stable. An endotracheal tube, nasogastric catheter and right jugular central line are again seen and stable. Bilateral pleural effusions right greater than left are noted. Bibasilar infiltrates are again seen right greater than left. No new focal abnormality is noted. IMPRESSION: Stable bibasilar changes worse on the right than the left. Electronically Signed   By: Inez Catalina M.D.   On: 11/24/2015 07:48   Dg Chest Port 1 View  11/23/2015  CLINICAL DATA:  Respiratory difficulty EXAM: PORTABLE CHEST 1 VIEW COMPARISON:  Yesterday FINDINGS: Tubular device is stable. Bibasilar opacity right renal left is stable on the right but improved on the left. This likely represents a combination of pleural fluid and underlying airspace disease. Overall vascular congestion has improved. No pneumothorax. IMPRESSION: Bilateral pleural effusions and airspace disease is stable on the right but improved on the left. Overall improved vascular congestion. Electronically Signed   By: Marybelle Killings M.D.   On: 11/23/2015 07:42   Dg Chest Port 1 View  11/22/2015  CLINICAL DATA:  Endotracheal tube present [id number] EXAM: PORTABLE CHEST - 1 VIEW COMPARISON:  the previous day's study FINDINGS:  Endotracheal tube, nasogastric tube, and right IJ central line are stable in position. Probable layering pleural effusions bilaterally right greater than left. Probable adjacent atelectasis/ consolidation in the lung bases. Central pulmonary vascular congestion. Heart size upper limits normal for technique. No pneumothorax. Visualized skeletal structures are unremarkable. IMPRESSION: 1. Persistent layering pleural effusions and bibasilar atelectasis/consolidation. 2.  Support hardware stable in position. Electronically Signed   By: Lucrezia Europe M.D.   On: 11/22/2015 10:57   Dg Chest Port 1 View  11/21/2015  CLINICAL DATA:  Hypoxia EXAM: PORTABLE CHEST 1 VIEW COMPARISON:  November 19, 2015 FINDINGS: Endotracheal tube tip is 4.1 cm above the carina. Nasogastric tube tip and side port are below the diaphragm. Central catheter tip is in the superior cava. No pneumothorax. There is now mild cardiomegaly with bilateral pleural effusions. There is atelectatic change in the bases. The pulmonary vascularity appears within normal limits. No adenopathy evident. IMPRESSION: Tube and catheter positions as described without pneumothorax. Bilateral effusions with bibasilar atelectasis. Mild cardiomegaly. Suspect a degree of  underlying congestive heart failure. Electronically Signed   By: Lowella Grip III M.D.   On: 11/21/2015 08:00   Dg Chest Port 1 View  11/19/2015  CLINICAL DATA:  Respiratory failure EXAM: PORTABLE CHEST 1 VIEW COMPARISON:  Chest radiograph from one day prior. FINDINGS: Endotracheal tube tip is 6 cm above the carina. Right internal jugular central venous catheter terminates in the middle third of the superior vena cava. Enteric tube enters stomach with the tip not seen on this image. Stable cardiomediastinal silhouette with normal heart size. No pneumothorax. No pleural effusion. Improved lung volumes with resolved atelectasis. No pulmonary edema. IMPRESSION: 1. Endotracheal tube tip is 6 cm above the  carina, consider advancing 1-2 cm. 2. No active cardiopulmonary disease. Lung volumes are improved and basilar atelectasis has resolved. Electronically Signed   By: Ilona Sorrel M.D.   On: 11/19/2015 09:11   Portable Chest Xray  11/18/2015  CLINICAL DATA:  Respiratory failure EXAM: PORTABLE CHEST 1 VIEW COMPARISON:  Chest radiograph from one day prior. FINDINGS: Endotracheal tube tip is 2.0 cm above the carina. Enteric tube enters the stomach with the tip not seen on this image. Right internal jugular central venous catheter terminates at the cavoatrial junction. Stable cardiomediastinal silhouette with normal heart size. No pneumothorax. Trace right pleural effusion. No overt pulmonary edema. Hazy opacities at the right greater than left lung bases, slightly worsened bilaterally. IMPRESSION: 1. Well-positioned support hardware as described . 2. Slightly worsened hazy bibasilar lung opacities, right greater than left, favor atelectasis, cannot exclude a developing pneumonia. Electronically Signed   By: Ilona Sorrel M.D.   On: 11/18/2015 09:56   Portable Chest Xray  11/17/2015  CLINICAL DATA:  Acute respiratory failure with hypoxemia EXAM: PORTABLE CHEST 1 VIEW COMPARISON:  Chest radiograph from earlier today. FINDINGS: Endotracheal tube tip is 4 cm above the carina. Enteric tube terminates in the proximal stomach. Right internal jugular central venous catheter terminates in the middle third of the superior vena cava. Stable cardiomediastinal silhouette with normal heart size. No pneumothorax. No pleural effusion. No pulmonary edema. Mild right basilar scarring versus atelectasis. No acute consolidative airspace disease. IMPRESSION: Well-positioned support structures as described. Mild scarring versus atelectasis at the right lung base. Electronically Signed   By: Ilona Sorrel M.D.   On: 11/17/2015 18:48   Dg Chest Portable 1 View  11/17/2015  CLINICAL DATA:  Shortness of breath, decreased blood pressure in  O2 sats. Cough. Aspiration? EXAM: PORTABLE CHEST 1 VIEW COMPARISON:  Chest x-ray dated 12/08/2013. FINDINGS: Cardiomediastinal silhouette is stable in size and configuration. Probable mild scarring at the right lung base. Lungs otherwise clear. No pleural effusion or pneumothorax seen. Osseous and soft tissue structures about the chest are unremarkable. IMPRESSION: No active disease. Electronically Signed   By: Franki Cabot M.D.   On: 11/17/2015 14:49   Dg Abd Portable 1v  11/17/2015  CLINICAL DATA:  Severe lower abdominal pain.  GI bleed. EXAM: PORTABLE ABDOMEN - 1 VIEW COMPARISON:  12/08/2013 CT abdomen/pelvis. FINDINGS: There is prominent pneumatosis involving mildly dilated small bowel loops throughout the abdomen. Surgical sutures are noted in the left mid abdomen. No gross pneumoperitoneum on this supine image. Moderate stool in the rectum. Partially visualized left proximal femur hardware. IMPRESSION: Severe pneumatosis involving mildly dilated small bowel loops throughout the abdomen. Findings are worrisome for diffuse small bowel ischemia. Recommend correlation with CT abdomen/pelvis with IV contrast. These results were called by telephone at the time of interpretation on 11/17/2015 at 2:48  pm to Dr. Ezequiel Essex , who verbally acknowledged these results. Electronically Signed   By: Ilona Sorrel M.D.   On: 11/17/2015 14:50    Micro Results      Recent Results (from the past 240 hour(s))  Culture, blood (Routine X 2) w Reflex to ID Panel     Status: None   Collection Time: 11/23/15 10:50 AM  Result Value Ref Range Status   Specimen Description BLOOD RIGHT HAND  Final   Special Requests BOTTLES DRAWN AEROBIC ONLY 6CC  Final   Culture NO GROWTH 5 DAYS  Final   Report Status 11/28/2015 FINAL  Final  Culture, blood (Routine X 2) w Reflex to ID Panel     Status: None   Collection Time: 11/23/15 11:00 AM  Result Value Ref Range Status   Specimen Description BLOOD RIGHT ARM  Final   Special  Requests IN PEDIATRIC BOTTLE 3CC  Final   Culture NO GROWTH 5 DAYS  Final   Report Status 11/28/2015 FINAL  Final  Culture, respiratory (NON-Expectorated)     Status: None   Collection Time: 11/23/15 11:55 AM  Result Value Ref Range Status   Specimen Description TRACHEAL ASPIRATE  Final   Special Requests Normal  Final   Gram Stain   Final    MODERATE WBC PRESENT,BOTH PMN AND MONONUCLEAR NO SQUAMOUS EPITHELIAL CELLS SEEN NO ORGANISMS SEEN Performed at [facility]    Culture   Final    FEW KLEBSIELLA PNEUMONIAE Performed at [facility]    Report Status 11/25/2015 FINAL  Final   Organism ID, Bacteria KLEBSIELLA PNEUMONIAE  Final      Susceptibility   Klebsiella pneumoniae - MIC*    AMPICILLIN RESISTANT      AMPICILLIN/SULBACTAM 4 SENSITIVE Sensitive     CEFEPIME <=1 SENSITIVE Sensitive     CEFTAZIDIME <=1 SENSITIVE Sensitive     CEFTRIAXONE <=1 SENSITIVE Sensitive     CIPROFLOXACIN <=0.25 SENSITIVE Sensitive     GENTAMICIN <=1 SENSITIVE Sensitive     IMIPENEM <=0.25 SENSITIVE Sensitive     PIP/TAZO 8 SENSITIVE Sensitive     TOBRAMYCIN <=1 SENSITIVE Sensitive     TRIMETH/SULFA Value in next row Sensitive      <=20 SENSITIVE(NOTE)    * FEW KLEBSIELLA PNEUMONIAE       Today   Subjective    Shawn [name] today has no headache,no chest abdominal pain,no new weakness tingling or numbness, feels much better.   Objective   Blood pressure 142/72, pulse 78, temperature 97.8 F (36.6 C), temperature source Oral, resp. rate 16, height 5\' 11"  (1.803 m), weight 76.794 kg (169 lb 4.8 oz), SpO2 98 %.   Intake/Output Summary (Last 24 hours) at 11/29/15 1117 Last data filed at 11/29/15 0730  Gross per 24 hour  Intake    720 ml  Output   3075 ml  Net  -2355 ml    Exam Awake , mildly confused, No new F.N deficits, Normal affect [place].AT,PERRAL Supple Neck,No JVD, No cervical lymphadenopathy appriciated.  Symmetrical Chest wall movement, Good air movement  bilaterally, CTAB RRR,No Gallops,Rubs or new Murmurs, No Parasternal Heave +ve B.Sounds, Abd Soft, Non tender, No organomegaly appriciated, No rebound -guarding or rigidity. No Cyanosis, Clubbing or edema, No new Rash or bruise   Data Review   CBC w Diff:  Lab Results  Component Value Date   WBC 10.6* 11/28/2015   HGB 9.4* 11/28/2015   HCT 29.3* 11/28/2015   PLT 613* 11/28/2015  LYMPHOPCT 8 11/28/2015   MONOPCT 4 11/28/2015   EOSPCT 0 11/28/2015   BASOPCT 0 11/28/2015    CMP:  Lab Results  Component Value Date   NA 143 11/29/2015   K 2.8* 11/29/2015   CL 108 11/29/2015   CO2 25 11/29/2015   BUN 10 11/29/2015   CREATININE 0.91 11/29/2015   CREATININE 0.76 09/04/2011   PROT 4.5* 11/26/2015   ALBUMIN 1.7* 11/28/2015   BILITOT 1.3* 11/26/2015   ALKPHOS 267* 11/26/2015   AST 49* 11/26/2015   ALT 26 11/26/2015  .   Total Time in preparing paper work, data evaluation and todays exam - 35 minutes  Thurnell Lose M.D on 11/29/2015 at 11:17 AM  [facility]   Office  [phone]

## 2015-11-29 NOTE — Progress Notes (Addendum)
Patient doing fine.  Will advance to a regular diet.  Can be discharged to SNF or Rehab at anytime from our standpoint.  Wet to dry saline dressings daily for his midline wound.  Kathryne Eriksson. Dahlia Bailiff, MD, [place] [phone] [facility]

## 2015-11-29 NOTE — Care Management Note (Signed)
Case Management Note Previous CM note initiated by [facility] RN, CM  Patient Details  Name: Shawn [name] MRN: [id number] Date of Birth: [birth year]  Subjective/Objective:    Pt lives with son but plans to stay with his sister who is retired when he is medically stable for discharge.                              Action/Plan:    Pt admitted with Mesenteric ischemia  And  Septic shock s/p exp lap- plan is for pt to d/c to CIR then will go to stay with sister-  Has bed available today 3/29  Expected Discharge Date:    11/29/15              Expected Discharge Plan:  [place]  In-House Referral:     Discharge planning Services  CM Consult  Post Acute Care Choice:    Choice offered to:     DME Arranged:    DME Agency:     HH Arranged:    [place] Agency:     Status of Service:  Completed, signed off  Medicare Important Message Given:  Yes Date Medicare IM Given:    Medicare IM give by:    Date Additional Medicare IM Given:    Additional Medicare Important Message give by:     If discussed at [place] of Stay Meetings, dates discussed:    Discharge Disposition: IP rehab   Additional Comments:  Dawayne Patricia, RN 11/29/2015, 11:32 AM

## 2015-11-29 NOTE — Anesthesia Postprocedure Evaluation (Signed)
Anesthesia Post Note  Patient: Shawn [name]  Procedure(s) Performed: Procedure(s) (LRB): SMALL BOWEL REPAIR, SMALL BOWEL ANATAMOSIS AND ABDOMINAL CLOSURE (N/A)  Patient location during evaluation: SICU Anesthesia Type: General Level of consciousness: sedated Pain management: pain level controlled Vital Signs Assessment: post-procedure vital signs reviewed and stable Cardiovascular status: stable Anesthetic complications: no    Last Vitals:  Filed Vitals:   11/29/15 0637 11/29/15 1302  BP: 142/72 126/67  Pulse: 78 66  Temp: 36.6 C 37.1 C  Resp: 16 18    Last Pain:  Filed Vitals:   11/29/15 1303  PainSc: 2                  Shawn [name] S

## 2015-11-29 NOTE — Progress Notes (Signed)
Rehab admissions - I spoke with attending MD today and we have clearance for acute inpatient rehab admission.  Noted K+ 2.8 and attending is aware and will address this am.  Bed available and will plan to admit to acute inpatient rehab today.  Call me for questions.  [phone]

## 2015-11-29 NOTE — Progress Notes (Signed)
Patient ID: Shawn [name], male   DOB: 06-17-1951, 65 y.o.   MRN: [id number] Patient arrived from [facility] via hospital bed with RN and belongings. Patient alert and oriented x 4 but complains 10/10 with abdominal pain. Waited on pharmacy to acknowledge medication orders before giving medication for pain. Oriented patient to room, rehab schedule, health resource notebook, rehab process, fall prevention plan, and rehab safety plan. Oriented patient to nurse call system. Patient stated he would like information relayed to his son when he comes to visit.

## 2015-11-29 NOTE — Interval H&P Note (Signed)
Shawn [name] was admitted today to Inpatient Rehabilitation with the diagnosis of respiratory failure/small bowel repair/septic shock [place] pneumonia /multiple medical.  The patient's history has been reviewed, patient examined, and there is no change in status.  Patient continues to be appropriate for intensive inpatient rehabilitation.  I have reviewed the patient's chart and labs.  Questions were answered to the patient's satisfaction. The PAPE has been reviewed and assessment remains appropriate.  Shawn [name] Shawn [name] 11/29/2015, 3:06 PM

## 2015-11-29 NOTE — Discharge Instructions (Signed)
Follow with Primary MD MCGOWEN,PHILIP H, MD in 7 days   Get BMP, Magnesium tomorrow.    Activity: As tolerated with Full fall precautions use walker/cane & assistance as needed   Disposition CIR/SNF   Diet:   Soft , with feeding assistance and aspiration precautions.  For Heart failure patients - Check your Weight same time everyday, if you gain over 2 pounds, or you develop in leg swelling, experience more shortness of breath or chest pain, call your Primary MD immediately. Follow Cardiac Low Salt Diet and 1.5 lit/day fluid restriction.   On your next visit with your primary care physician please Get Medicines reviewed and adjusted.   Please request your Prim.MD to go over all Hospital Tests and Procedure/Radiological results at the follow up, please get all Hospital records sent to your Prim MD by signing hospital release before you go home.   If you experience worsening of your admission symptoms, develop shortness of breath, life threatening emergency, suicidal or homicidal thoughts you must seek medical attention immediately by calling 911 or calling your MD immediately  if symptoms less severe.  You Must read complete instructions/literature along with all the possible adverse reactions/side effects for all the Medicines you take and that have been prescribed to you. Take any new Medicines after you have completely understood and accpet all the possible adverse reactions/side effects.   Do not drive, operating heavy machinery, perform activities at heights, swimming or participation in water activities or provide baby sitting services if your were admitted for syncope or siezures until you have seen by Primary MD or a Neurologist and advised to do so again.  Do not drive when taking Pain medications.    Do not take more than prescribed Pain, Sleep and Anxiety Medications  Special Instructions: If you have smoked or chewed Tobacco  in the last 2 yrs please stop smoking, stop any  regular Alcohol  and or any Recreational drug use.  Wear Seat belts while driving.   Please note  You were cared for by a hospitalist during your hospital stay. If you have any questions about your discharge medications or the care you received while you were in the hospital after you are discharged, you can call the unit and asked to speak with the hospitalist on call if the hospitalist that took care of you is not available. Once you are discharged, your primary care physician will handle any further medical issues. Please note that NO REFILLS for any discharge medications will be authorized once you are discharged, as it is imperative that you return to your primary care physician (or establish a relationship with a primary care physician if you do not have one) for your aftercare needs so that they can reassess your need for medications and monitor your lab values.   [facility], [place] [phone]  OPEN ABDOMINAL SURGERY: POST OP INSTRUCTIONS  Always review your discharge instruction sheet given to you by the facility where your surgery was performed.  IF YOU HAVE DISABILITY OR FAMILY LEAVE FORMS, YOU MUST BRING THEM TO THE OFFICE FOR PROCESSING.  PLEASE DO NOT GIVE THEM TO YOUR DOCTOR.  1. A prescription for pain medication may be given to you upon discharge.  Take your pain medication as prescribed, if needed.  If narcotic pain medicine is not needed, then you may take acetaminophen (Tylenol) or ibuprofen (Advil) as needed. 2. Take your usually prescribed medications unless otherwise directed. 3. If you need  a refill on your pain medication, please contact your pharmacy. They will contact our office to request authorization.  Prescriptions will not be filled after 5pm or on week-ends. 4. You should follow a light diet the first few days after arrival home, such as soup and crackers, pudding, etc.unless your doctor has advised otherwise. A high-fiber, low fat diet can  be resumed as tolerated.   Be sure to include lots of fluids daily. Most patients will experience some swelling and bruising on the chest and neck area.  Ice packs will help.  Swelling and bruising can take several days to resolve 5. Most patients will experience some swelling and bruising in the area of the incision. Ice pack will help. Swelling and bruising can take several days to resolve..  6. It is common to experience some constipation if taking pain medication after surgery.  Increasing fluid intake and taking a stool softener will usually help or prevent this problem from occurring.  A mild laxative (Milk of Magnesia or Miralax) should be taken according to package directions if there are no bowel movements after 48 hours. 7.  You may have steri-strips (small skin tapes) in place directly over the incision.  These strips should be left on the skin for 7-10 days.  If your surgeon used skin glue on the incision, you may shower in 24 hours.  The glue will flake off over the next 2-3 weeks.  Any sutures or staples will be removed at the office during your follow-up visit. You may find that a light gauze bandage over your incision may keep your staples from being rubbed or pulled. You may shower and replace the bandage daily. 8. ACTIVITIES:  You may resume regular (light) daily activities beginning the next day--such as daily self-care, walking, climbing stairs--gradually increasing activities as tolerated.  You may have sexual intercourse when it is comfortable.  Refrain from any heavy lifting or straining until approved by your doctor. a. You may drive when you no longer are taking prescription pain medication, you can comfortably wear a seatbelt, and you can safely maneuver your car and apply brakes b. Return to Work: ___________________________________ 67. You should see your doctor in the office for a follow-up appointment approximately two weeks after your surgery.  Make sure that you call for this  appointment within a day or two after you arrive home to insure a convenient appointment time. OTHER INSTRUCTIONS:  _____________________________________________________________ _____________________________________________________________  WHEN TO CALL YOUR DOCTOR: 1. Fever over 101.0 2. Inability to urinate 3. Nausea and/or vomiting 4. Extreme swelling or bruising 5. Continued bleeding from incision. 6. Increased pain, redness, or drainage from the incision. 7. Difficulty swallowing or breathing 8. Muscle cramping or spasms. 9. Numbness or tingling in hands or feet or around lips.  The clinic staff is available to answer your questions during regular business hours.  Please dont hesitate to call and ask to speak to one of the nurses if you have concerns.  For further questions, please visit [email or web address]   Dressing Change A dressing is a material placed over wounds. It keeps the wound clean, dry, and protected from further injury.  BEFORE YOU BEGIN  Get your supplies together. Things you may need include:  Salt solution (saline).  Flexible gauze bandage.  Tape.  Gloves.  Belly (abdominal) pads.  Gauze squares.  Plastic bags.  Take pain medicine 30 minutes before the bandage change if you need it.  Take a shower before you do the first bandage change  of the day. Put plastic wrap or a bag over the dressing. REMOVING YOUR OLD BANDAGE  Wash your hands with soap and water. Dry your hands with a clean towel.  Put on your gloves.  Remove any tape.  Remove the old bandage as told. If it sticks, put a small amount of warm water on it to loosen the bandage.  Remove any gauze or packing tape in your wound.  Take off your gloves.  Put the gloves, tape, gauze, or any packing tape in a plastic bag. CHANGING YOUR BANDAGE  Open the supplies.  Take the cap off the salt solution.  Open the gauze. Leave the gauze on the inside of the package.  Put  on your gloves.  Clean your wound as told by your doctor.  Keep your wound dry if your doctor told you to do so.  Your doctor may tell you to do one or more of the following:  Pick up the gauze. Pour the salt solution over the gauze. Squeeze out the extra salt solution.  Put medicated cream or other medicine on your wound.  Put solution soaked gauze only in your wound, not on the skin around it.  Pack your wound loosely.  Put dry gauze on your wound.  Put belly pads over the dry gauze if your bandages soak through.  Tape the bandage in place so it will not fall off. Do not wrap the tape all the way around your arm or leg.  Wrap the bandage with the flexible gauze bandage as told by your doctor.  Take off your gloves. Put them in the plastic bag with the old bandage. Tie the bag shut and throw it away.  Keep the bandage clean and dry.  Wash your hands. GET HELP RIGHT AWAY IF:   Your skin around the wound looks red.  Your wound feels more tender or sore.  You see yellowish-white fluid (pus) in the wound.  Your wound smells bad.  You have a fever.  Your skin around the wound has a red rash that itches and burns.  You see black or yellow skin in your wound that was not there before.  You feel sick to your stomach (nauseous), throw up (vomit), and feel very tired.   This information is not intended to replace advice given to you by your health care provider. Make sure you discuss any questions you have with your health care provider.   Document Released: 11/15/2008 Document Revised: 09/09/2014 Document Reviewed: 06/30/2011 Elsevier Interactive Patient Education [employer].

## 2015-11-30 ENCOUNTER — Inpatient Hospital Stay (HOSPITAL_COMMUNITY): Payer: Medicare Other | Admitting: Physical Therapy

## 2015-11-30 ENCOUNTER — Inpatient Hospital Stay (HOSPITAL_COMMUNITY): Payer: Medicare Other | Admitting: Occupational Therapy

## 2015-11-30 ENCOUNTER — Inpatient Hospital Stay (HOSPITAL_COMMUNITY): Payer: Self-pay | Admitting: Speech Pathology

## 2015-11-30 ENCOUNTER — Telehealth: Payer: Self-pay | Admitting: *Deleted

## 2015-11-30 DIAGNOSIS — E8809 Other disorders of plasma-protein metabolism, not elsewhere classified: Secondary | ICD-10-CM

## 2015-11-30 LAB — CBC WITH DIFFERENTIAL/PLATELET
BASOS ABS: 0 10*3/uL (ref 0.0–0.1)
BASOS PCT: 0 %
EOS PCT: 2 %
Eosinophils Absolute: 0.2 10*3/uL (ref 0.0–0.7)
HCT: 28.9 % — ABNORMAL LOW (ref 39.0–52.0)
Hemoglobin: 9.2 g/dL — ABNORMAL LOW (ref 13.0–17.0)
LYMPHS PCT: 14 %
Lymphs Abs: 1 10*3/uL (ref 0.7–4.0)
MCH: 29.2 pg (ref 26.0–34.0)
MCHC: 31.8 g/dL (ref 30.0–36.0)
MCV: 91.7 fL (ref 78.0–100.0)
Monocytes Absolute: 0.4 10*3/uL (ref 0.1–1.0)
Monocytes Relative: 6 %
Neutro Abs: 5.8 10*3/uL (ref 1.7–7.7)
Neutrophils Relative %: 79 %
PLATELETS: 757 10*3/uL — AB (ref 150–400)
RBC: 3.15 MIL/uL — AB (ref 4.22–5.81)
RDW: 15 % (ref 11.5–15.5)
WBC: 7.4 10*3/uL (ref 4.0–10.5)

## 2015-11-30 LAB — GLUCOSE, CAPILLARY
GLUCOSE-CAPILLARY: 101 mg/dL — AB (ref 65–99)
GLUCOSE-CAPILLARY: 101 mg/dL — AB (ref 65–99)
GLUCOSE-CAPILLARY: 89 mg/dL (ref 65–99)
Glucose-Capillary: 96 mg/dL (ref 65–99)
Glucose-Capillary: 88 mg/dL (ref 65–99)
Glucose-Capillary: 97 mg/dL (ref 65–99)

## 2015-11-30 LAB — COMPREHENSIVE METABOLIC PANEL
ALBUMIN: 1.9 g/dL — AB (ref 3.5–5.0)
ALT: 69 U/L — AB (ref 17–63)
AST: 87 U/L — AB (ref 15–41)
Alkaline Phosphatase: 248 U/L — ABNORMAL HIGH (ref 38–126)
Anion gap: 8 (ref 5–15)
BUN: 10 mg/dL (ref 6–20)
CHLORIDE: 111 mmol/L (ref 101–111)
CO2: 23 mmol/L (ref 22–32)
CREATININE: 1.16 mg/dL (ref 0.61–1.24)
Calcium: 8.1 mg/dL — ABNORMAL LOW (ref 8.9–10.3)
GFR calc Af Amer: >60 mL/min (ref >60)
GFR calc non Af Amer: >60 mL/min (ref >60)
GLUCOSE: 88 mg/dL (ref 65–99)
POTASSIUM: 4.7 mmol/L (ref 3.5–5.1)
SODIUM: 142 mmol/L (ref 135–145)
Total Bilirubin: 1.4 mg/dL — ABNORMAL HIGH (ref 0.3–1.2)
Total Protein: 4.6 g/dL — ABNORMAL LOW (ref 6.5–8.1)

## 2015-11-30 LAB — MAGNESIUM: MAGNESIUM: 1.9 mg/dL (ref 1.7–2.4)

## 2015-11-30 MED ORDER — CETYLPYRIDINIUM CHLORIDE 0.05 % MT LIQD
7.0000 mL | Freq: Two times a day (BID) | OROMUCOSAL | Status: DC
  Administered 2015-11-30 – 2015-12-10 (×16): 7 mL via OROMUCOSAL

## 2015-11-30 MED ORDER — INSULIN ASPART 100 UNIT/ML ~~LOC~~ SOLN
0.0000 [IU] | Freq: Three times a day (TID) | SUBCUTANEOUS | Status: DC
  Administered 2015-12-03: 2 [IU] via SUBCUTANEOUS
  Administered 2015-12-03: 3 [IU] via SUBCUTANEOUS
  Administered 2015-12-04 – 2015-12-07 (×3): 2 [IU] via SUBCUTANEOUS

## 2015-11-30 MED ORDER — MAGIC MOUTHWASH W/LIDOCAINE
10.0000 mL | Freq: Four times a day (QID) | ORAL | Status: DC | PRN
  Administered 2015-11-30 – 2015-12-09 (×6): 10 mL via ORAL
  Filled 2015-11-30 (×7): qty 10

## 2015-11-30 NOTE — Progress Notes (Signed)
Patient information reviewed and entered into eRehab system by Joffrey Kerce, RN, CRRN, PPS Coordinator.  Information including medical coding and functional independence measure will be reviewed and updated through discharge.     Per nursing patient was given "Data Collection Information Summary for Patients in Inpatient Rehabilitation Facilities with attached "Privacy Act Statement-Health Care Records" upon admission.  

## 2015-11-30 NOTE — Evaluation (Signed)
Physical Therapy Assessment and Plan  Patient Details  Name: Shawn [name] MRN: [id number] Date of Birth: [birth year]  PT Diagnosis: Abnormal posture, Abnormality of gait, Difficulty walking, Muscle weakness and Pain in abdomen Rehab Potential: Good ELOS: 12-14 days   Today's Date: 11/30/2015 PT Individual Time: 1000-1100 PT Individual Time Calculation (min): 60 min    Problem List:  Patient Active Problem List   Diagnosis Date Noted  . Debility 11/29/2015  . Debilitated 11/29/2015  . Paroxysmal atrial fibrillation (HCC)   . Acute on chronic systolic heart failure (HCC)   . Adrenal insufficiency (HCC)   . Alcohol abuse   . Mesenteric ischemia (HCC)   . Polysubstance abuse   . Bipolar affective disorder in remission (HCC)   . History of ventilator dependency (HCC)   . AKI (acute kidney injury) (HCC)   . Dysphagia   . Tachycardia   . Tachypnea   . Hypokalemia   . Leukocytosis   . Acute blood loss anemia   . Acute respiratory failure (HCC)   . Persistent atrial fibrillation (HCC)   . Acute on chronic combined systolic and diastolic CHF (congestive heart failure) (HCC)   . Acute respiratory failure with hypoxemia (HCC)   . Cardiomyopathy, ischemic 11/19/2015  . Complete heart block (HCC) 11/18/2015  . Malnutrition of moderate degree 11/18/2015  . Pressure ulcer 11/17/2015  . Septic shock (HCC) 11/17/2015  . Pneumatosis coli 11/17/2015  . Gastroenteritis due to norovirus 11/25/2013  . Other pancytopenia (HCC) 11/25/2013  . Dehydration 11/20/2013  . MDD (major depressive disorder) (HCC) 11/17/2013  . Depression 11/17/2013  . Fall 11/16/2013  . Bipolar affective disorder, current episode mixed (HCC) 04/12/2013  . Alcohol abuse with intoxication (HCC) 04/12/2013  . Alcohol dependence (HCC) 04/12/2013  . Bipolar I disorder, most recent episode depressed (HCC) 04/12/2013  . Constipation 08/08/2012  . Hip fracture, left (HCC) 08/06/2012  . Traumatic hematoma of buttock  08/06/2012  . ETOH abuse 08/06/2012  . H/O: substance abuse 08/06/2012  . Tobacco abuse 08/06/2012  . History of hernia repair 08/06/2012  . Hip fracture, intertrochanteric (HCC) 08/06/2012  . Bipolar disorder, current episode mixed, mild (HCC) 02/04/2012  . Health maintenance examination 10/24/2011  . Hypoxia 10/24/2011  . Prostate cancer screening 09/19/2011  . Tobacco dependence 09/08/2011  . Erectile dysfunction 08/06/2011  . Osteoarthritis of left knee 08/06/2011  . Elevated blood pressure reading without diagnosis of hypertension 08/06/2011  . History of depression 08/06/2011    Past Medical History:  Past Medical History  Diagnosis Date  . Osteoarthritis X years    left knee.  Distant hx (age 77) "dislocated" knee and required surgery, then crush injury to patella required knee cap removal.  . Diverticular disease     "itis" x 2 episodes (last was about 2007)  . Tobacco dependence   . Multiple rib fractures 10/09/10    s/p fall down a ravine--9th and 10th on right, contusion of left 7th and 8th ribs  . Fracture of metatarsal bone(s), closed     3rd, 4th, 5th mid shaft on left foot  . History of substance abuse     cocaine, marijuana, acid, alcohol (in remission since 2010)  . Solar dermatitis     face and ears  . Bipolar disorder [facility])     Has had multiple psych hospitalization in the past, most recently at [facility] 01/31/11-02/04/11.  Says meds made him emotionally blunted. (lamictal and wellbutrin).  . Alcoholism (HCC)     "Dry"  since 2002; relapse 2014  . Anxiety and depression   . Vitamin B12 deficiency 10/2013    IF ab positive.  Replacement therapy started end of March 2015  . History of septic shock 11/2015    ? due to small bowel ischemia: hospitalized + surgeries 11/2015  . Anxiety    Past Surgical History:  Past Surgical History  Procedure Laterality Date  . Knee dislocation surgery  1966  . Patellectomy  1968    s/p crush injury  . Inguinal  hernia repair      left  . Hernia repair      at 65 years of age  32  . Total knee arthroplasty  11/20/2011    Procedure: TOTAL KNEE ARTHROPLASTY;  Surgeon: Ninetta Lights, MD;  Location: [place];  Service: Orthopedics;  Laterality: Left;  DR Percell Miller WANTS 90MINUTES FOR THIS CASE  . Inguinal hernia repair  06/15/2012    Procedure: HERNIA REPAIR INGUINAL INCARCERATED;  Surgeon: Edward Jolly, MD;  Location: [facility];  Service: General;  Laterality: Right;  . Bowel resection  06/15/2012    Procedure: SMALL BOWEL RESECTION;  Surgeon: Edward Jolly, MD;  Location: [facility];  Service: General;  Laterality: N/A;  . Femur im nail  08/06/2012    Procedure: INTRAMEDULLARY (IM) NAIL FEMORAL;  Surgeon: Johnn Hai, MD;  Location: [facility];  Service: Orthopedics;  Laterality: Left;  . Laparotomy N/A 11/17/2015    Procedure: EXPLORATORY LAPAROTOMY;  Surgeon: Donnie Mesa, MD;  Location: [place];  Service: General;  Laterality: N/A;  . Laparotomy N/A 11/19/2015    Procedure: EXPLORATORY LAPAROTOMY OPEN ABDOMEN ABDOMINAL WOUND VAC CHANGE;  Surgeon: Ralene Ok, MD;  Location: [place];  Service: General;  Laterality: N/A;  . Bowel resection  11/19/2015    Procedure: SMALL BOWEL RESECTION;  Surgeon: Ralene Ok, MD;  Location: [place];  Service: General;;  . Cardiac catheterization N/A 11/20/2015    Procedure: Temporary Wire;  Surgeon: Evans Lance, MD;  Location: [place] [facility];  Service: Cardiovascular;  Laterality: N/A;  . Small bowel repair N/A 11/21/2015    Procedure: SMALL BOWEL REPAIR, SMALL BOWEL ANATAMOSIS AND ABDOMINAL CLOSURE;  Surgeon: Coralie Keens, MD;  Location: [place];  Service: General;  Laterality: N/A;    Assessment & Plan Clinical Impression: A 65 y.o. right handed male with history of polysubstance abuse, alcohol abuse, bipolar disorder, chronic systolic congestive heart failure. Patient lives alone but plans to stay with his sister who is retired. Patient independent prior  to admission. One level home. Presented 11/17/2015 with 3 day history of abdominal pain with nausea and vomiting as well as constipation with fever and chills. Patient noted history of small bowel resection in the past for incarcerated femoral hernia perforation in 2013 and again in 2016. Findings of lactic acid of 12, serum creatinine 6.7. CT of abdomen and pelvis with extensive small bowel pneumatosis, mesenteric and portal venous gas. Underwent exploratory laparotomy 11/17/2015 and found to have viable gut no evidence of ischemic bowel. Wound was left open until 11/21/2015 and underwent closure with postoperative ventilatory management. Hospital course cardiology services consulted 11/18/2015 for findings of complete heart block/STEMI with heart rate in the 30s. Developed A. fib with RVR. Echocardiogram with ejection fraction of 45%. Severe inferior lateral hypokinesis to akinesis. Patient did receive a temporary pacemaker for short time. Placed on intravenous amiodarone. Attempted cardioversion failed. Patient extubated 11/26/2015. Creatinine has improved to 1.04. Acute blood loss anemia 9.4 and monitored.  Completed a course of antibiotics for Klebsiella pneumonia. Subcutaneous heparin later added for DVT prophylaxis. K+ 2.8 and attending MD addressing results today. Currently on a Full liquid diet and advanced as tolerated. Patient to be admitted for a comprehensive inpatient rehabilitation program.Patient transferred to CIR on 11/29/2015 .   Patient currently requires min with mobility secondary to muscle weakness, decreased cardiorespiratoy endurance and decreased sitting balance, decreased standing balance, decreased postural control and decreased balance strategies.  Prior to hospitalization, patient was modified independent  with mobility and lived with Alone in a House home.  Home access is  Ramped entrance.  Patient will benefit from skilled PT intervention to maximize safe functional mobility,  minimize fall risk and decrease caregiver burden for planned discharge home with 24 hour supervision.  Anticipate patient will benefit from follow up [facility] at discharge.  PT - End of Session Activity Tolerance: Tolerates 30+ min activity with multiple rests Endurance Deficit: Yes Endurance Deficit Description: required multiple rest breaks PT Assessment Rehab Potential (ACUTE/IP ONLY): Good Barriers to Discharge: Decreased caregiver support PT Patient demonstrates impairments in the following area(s): Balance;Safety;Behavior;Endurance;Motor;Pain PT Transfers Functional Problem(s): Bed to Chair;Bed Mobility;Car;Furniture PT Locomotion Functional Problem(s): Ambulation;Wheelchair Mobility;Stairs PT Plan PT Intensity: Minimum of 1-2 x/day ,45 to 90 minutes PT Frequency: 5 out of 7 days PT Duration Estimated Length of Stay: 12-14 days PT Treatment/Interventions: Ambulation/gait training;Balance/vestibular training;Community reintegration;DME/adaptive equipment instruction;Functional mobility training;Neuromuscular re-education;Patient/family education;Pain management;Psychosocial support;Stair training;Therapeutic Activities;Therapeutic Exercise;UE/LE Coordination activities;UE/LE Strength taining/ROM;Wheelchair propulsion/positioning PT Transfers Anticipated Outcome(s): mod I PT Locomotion Anticipated Outcome(s): S ambulation home/community and stairs PT Recommendation Recommendations for Other Services: Neuropsych consult Follow Up Recommendations: Home health PT Patient destination: Home Equipment Recommended: To be determined  Skilled Therapeutic Intervention Pt received supine in bed, denies pain and agreeable to treatment. PT initial evaluation performed and completed. Pt noted to have large incontinent bowel movement; performed rolling R/L with bedrails with S; totalA for hygiene and clothing management. Stand pivot to w/c with RW and minA. Gait with RW x25' and minA to min guard; limited  by endurance. Educated pt on rehab process, goals, estimated length of stay, and falls prevention with use of call bell system for assistance to get OOB or use restroom; pt agreeable to all the above. Remained seated in w/c with quick release belt intact and NT present, all needs within reach.   PT Evaluation Precautions/Restrictions Precautions Precautions: Fall Restrictions Weight Bearing Restrictions: No General Chart Reviewed: Yes Response to Previous Treatment: Not applicable Family/Caregiver Present: No Vital Signs  Pain Pain Assessment Pain Assessment: No/denies pain Pain Score: 8  Pain Type: Acute pain;Surgical pain Pain Location: Abdomen Pain Orientation: Mid Pain Descriptors / Indicators: Aching Pain Onset: On-going Pain Intervention(s):  (pre-medicated) Home Living/Prior Functioning Home Living Available Help at Discharge: Family;Available 24 hours/day Type of Home: House Home Access: Ramped entrance Home Layout: One level Bathroom Shower/Tub: Multimedia programmer: Handicapped height Bathroom Accessibility: Yes Additional Comments: lives alone, but plans to stay with his sister who is retired or pt reports would like to d/c to his mother's home  Lives With: Alone Prior Function Level of Independence: Independent with basic ADLs;Independent with homemaking with ambulation;Requires assistive device for independence  Able to Take Stairs?: Yes Driving: No Leisure: Hobbies-yes (Comment) (enjoys wordworking) Vision/Perception  Perception Comments: WFL  Cognition Overall Cognitive Status: History of cognitive impairments - at baseline (suspected, no family present to verify ) Arousal/Alertness: Awake/alert Orientation Level: Oriented to person;Oriented to place;Disoriented to situation;Disoriented to time Attention: Selective Selective  Attention: Appears intact Memory: Impaired Memory Impairment: Retrieval deficit;Decreased recall of new  information Awareness: Appears intact Problem Solving: Appears intact Safety/Judgment: Appears intact Comments: decreased motivation to participate in therapies  Sensation Sensation Light Touch: Appears Intact Stereognosis: Not tested Hot/Cold: Not tested Proprioception: Appears Intact Coordination Gross Motor Movements are Fluid and Coordinated: Yes Fine Motor Movements are Fluid and Coordinated: Yes Finger Nose Finger Test: [facility] Heel Shin Test: [facility] Motor  Motor Motor: Abnormal postural alignment and control Motor - Skilled Clinical Observations: generalized weakness  Mobility Bed Mobility Bed Mobility: Rolling Right;Rolling Left Rolling Right: 5: Supervision;With rail Rolling Right Details: Verbal cues for technique Rolling Left: 5: Supervision;With rail Rolling Left Details: Verbal cues for technique Transfers Transfers: Yes Stand Pivot Transfers: 4: Min assist Stand Pivot Transfer Details: Verbal cues for precautions/safety;Verbal cues for technique Locomotion  Ambulation Ambulation: Yes Ambulation/Gait Assistance: 4: Min assist Ambulation Distance (Feet): 25 Feet Assistive device: Rolling walker Ambulation/Gait Assistance Details: Verbal cues for technique;Verbal cues for precautions/safety;Tactile cues for posture;Verbal cues for safe use of DME/AE Gait Gait: Yes Gait Pattern: Impaired Gait Pattern: Trunk flexed;Shuffle;Poor foot clearance - left;Poor foot clearance - right;Decreased stride length Gait velocity: decreased for age/gender norms Stairs / Additional Locomotion Stairs: No Architect: Yes Wheelchair Assistance: 5: Investment banker, operational Details: Verbal cues for technique;Verbal cues for Information systems manager: Both upper extremities Wheelchair Parts Management: Needs assistance Distance: 150'  Trunk/Postural Assessment  Cervical Assessment Cervical Assessment: Exceptions to [facility] (forward  head posture) Thoracic Assessment Thoracic Assessment: Exceptions to [facility] (increased kyphosis) Lumbar Assessment Lumbar Assessment: Within Functional Limits Postural Control Postural Control: Deficits on evaluation (impaired/delayed righting reactions and balance strategies)  Balance   Extremity Assessment  RUE Assessment RUE Assessment: Within Functional Limits (generalized weakness, grossly 4/5 and loose gross grasp) LUE Assessment LUE Assessment: Within Functional Limits (generalized weakness, Lt > Rt, loose gross grasp) RLE Assessment RLE Assessment: Within Functional Limits (generalized weakness, grossly 4/5 to 4+/5 throughout) LLE Assessment LLE Assessment: Within Functional Limits (generalized weakness, grossly 4/5 to 4+/5 throughout)   See Function Navigator for Current Functional Status.   Refer to Care Plan for Long Term Goals  Recommendations for other services: Neuropsych  Discharge Criteria: Patient will be discharged from PT if patient refuses treatment 3 consecutive times without medical reason, if treatment goals not met, if there is a change in medical status, if patient makes no progress towards goals or if patient is discharged from hospital.  The above assessment, treatment plan, treatment alternatives and goals were discussed and mutually agreed upon: by patient  Luberta Mutter 11/30/2015, 12:25 PM

## 2015-11-30 NOTE — Care Management Note (Signed)
[facility] Individual Statement of Services  Patient Name:  Shawn [name]  Date:  11/30/2015  Welcome to the [facility].  Our goal is to provide you with an individualized program based on your diagnosis and situation, designed to meet your specific needs.  With this comprehensive rehabilitation program, you will be expected to participate in at least 3 hours of rehabilitation therapies Monday-Friday, with modified therapy programming on the weekends.  Your rehabilitation program will include the following services:  Physical Therapy (PT), Occupational Therapy (OT), Speech Therapy (ST), 24 hour per day rehabilitation nursing, Therapeutic Recreaction (TR), Neuropsychology, Case Management (Social Worker), Rehabilitation Medicine, Nutrition Services and Pharmacy Services  Weekly team conferences will be held on Wednesday to discuss your progress.  Your Social Worker will talk with you frequently to get your input and to update you on team discussions.  Team conferences with you and your family in attendance may also be held.  Expected length of stay: 12-14 days Overall anticipated outcome: supervision set up  Depending on your progress and recovery, your program may change. Your Social Worker will coordinate services and will keep you informed of any changes. Your Social Worker's name and contact numbers are listed  below.  The following services may also be recommended but are not provided by the [facility]:   [place] will be made to provide these services after discharge if needed.  Arrangements include referral to agencies that provide these services.  Your insurance has been verified to be:  Medicare Your primary doctor is:  Ricardo Jericho  Pertinent information will be shared with your doctor and your insurance company.  Social Worker:   Ovidio Kin, [place] or ([phone]  Information discussed with and copy given to patient by: Elease Hashimoto, 11/30/2015, 12:54 PM

## 2015-11-30 NOTE — Progress Notes (Signed)
Social Work  Social Work Assessment and Plan  Patient Details  Name: Shawn [name] MRN: [id number] Date of Birth: [birth year]  Today's Date: 11/30/2015  Problem List:  Patient Active Problem List   Diagnosis Date Noted  . Debility 11/29/2015  . Debilitated 11/29/2015  . Paroxysmal atrial fibrillation (HCC)   . Acute on chronic systolic heart failure ([place])   . Adrenal insufficiency ([place])   . Alcohol abuse   . 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 Polysubstance abuse   . Bipolar affective disorder in remission ([place])   . History of ventilator dependency ([place])   . AKI (acute kidney injury) ([place])   . Dysphagia   . Tachycardia   . Tachypnea   . Hypokalemia   . Leukocytosis   . Acute blood loss anemia   . Acute respiratory failure ([place])   . Persistent atrial fibrillation ([place])   . Acute on chronic combined systolic and diastolic CHF (congestive heart failure) ([place])   . Acute respiratory failure with hypoxemia ([place])   . Cardiomyopathy, ischemic 11/19/2015  . Complete heart block ([place]) 11/18/2015  . Malnutrition of moderate degree 11/18/2015  . Pressure ulcer 11/17/2015  . Septic shock ([place]) 11/17/2015  . Pneumatosis coli 11/17/2015  . Gastroenteritis due to norovirus 11/25/2013  . Other pancytopenia ([place]) 11/25/2013  . Dehydration 11/20/2013  . MDD (major depressive disorder) ([place]) 11/17/2013  . Depression 11/17/2013  . Fall 11/16/2013  . Bipolar affective disorder, current episode mixed ([place]) 04/12/2013  . Alcohol abuse with intoxication ([place]) 04/12/2013  . Alcohol dependence ([place]) 04/12/2013  . Bipolar I disorder, most recent episode depressed ([place]) 04/12/2013  . Constipation 08/08/2012  . Hip fracture, left ([place]) 08/06/2012  . Traumatic hematoma of buttock 08/06/2012  . ETOH abuse 08/06/2012  . H/O: substance abuse 08/06/2012  . Tobacco abuse 08/06/2012  . History of hernia repair 08/06/2012  . Hip fracture, intertrochanteric ([place]) 08/06/2012  . Bipolar disorder, current  episode mixed, mild ([place]) 02/04/2012  . Health maintenance examination 10/24/2011  . Hypoxia 10/24/2011  . Prostate cancer screening 09/19/2011  . Tobacco dependence 09/08/2011  . Erectile dysfunction 08/06/2011  . Osteoarthritis of left knee 08/06/2011  . Elevated blood pressure reading without diagnosis of hypertension 08/06/2011  . History of depression 08/06/2011   Past Medical History:  Past Medical History  Diagnosis Date  . Osteoarthritis X years    left knee.  Distant hx (age 39) "dislocated" knee and required surgery, then crush injury to patella required knee cap removal.  . Diverticular disease     "itis" x 2 episodes (last was about 2007)  . Tobacco dependence   . Multiple rib fractures 10/09/10    s/p fall down a ravine--9th and 10th on right, contusion of left 7th and 8th ribs  . Fracture of metatarsal bone(s), closed     3rd, 4th, 5th mid shaft on left foot  . History of substance abuse     cocaine, marijuana, acid, alcohol (in remission since 2010)  . Solar dermatitis     face and ears  . Bipolar disorder [facility])     Has had multiple psych hospitalization in the past, most recently at [facility] 01/31/11-02/04/11.  Says meds made him emotionally blunted. (lamictal and wellbutrin).  . Alcoholism ([place])     "Dry" since 2002; relapse 2014  . Anxiety and depression   . Vitamin B12 deficiency 10/2013    IF ab positive.  Replacement therapy started end of March 2015  . History of  septic shock 11/2015    ? due to small bowel ischemia: hospitalized + surgeries 11/2015  . Anxiety    Past Surgical History:  Past Surgical History  Procedure Laterality Date  . Knee dislocation surgery  1966  . Patellectomy  1968    s/p crush injury  . Inguinal hernia repair      left  . Hernia repair      at 65 years of age  15  . Total knee arthroplasty  11/20/2011    Procedure: TOTAL KNEE ARTHROPLASTY;  Surgeon: Ninetta Lights, MD;  Location: [place];  Service: Orthopedics;   Laterality: Left;  DR Percell Miller WANTS 90MINUTES FOR THIS CASE  . Inguinal hernia repair  06/15/2012    Procedure: HERNIA REPAIR INGUINAL INCARCERATED;  Surgeon: Edward Jolly, MD;  [facility];  Service: General;  Laterality: Right;  . Bowel resection  06/15/2012    Procedure: SMALL BOWEL RESECTION;  Surgeon: Edward Jolly, MD;  [facility];  Service: General;  Laterality: N/A;  . Femur im nail  08/06/2012    Procedure: INTRAMEDULLARY (IM) NAIL FEMORAL;  Surgeon: Johnn Hai, MD;  [facility];  Service: Orthopedics;  Laterality: Left;  . Laparotomy N/A 11/17/2015    Procedure: EXPLORATORY LAPAROTOMY;  Surgeon: Donnie Mesa, MD;  Location: [place];  Service: General;  Laterality: N/A;  . Laparotomy N/A 11/19/2015    Procedure: EXPLORATORY LAPAROTOMY OPEN ABDOMEN ABDOMINAL WOUND VAC CHANGE;  Surgeon: Ralene Ok, MD;  Location: [place];  Service: General;  Laterality: N/A;  . Bowel resection  11/19/2015    Procedure: SMALL BOWEL RESECTION;  Surgeon: Ralene Ok, MD;  Location: [place];  Service: General;;  . Cardiac catheterization N/A 11/20/2015    Procedure: Temporary Wire;  Surgeon: Evans Lance, MD;  Location: [facility];  Service: Cardiovascular;  Laterality: N/A;  . Small bowel repair N/A 11/21/2015    Procedure: SMALL BOWEL REPAIR, SMALL BOWEL ANATAMOSIS AND ABDOMINAL CLOSURE;  Surgeon: Coralie Keens, MD;  Location: [facility];  Service: General;  Laterality: N/A;   Social History:  reports that he quit smoking about 11 months ago. His smoking use included Cigarettes. He has a 30 pack-year smoking history. He has never used smokeless tobacco. He reports that he drinks alcohol. He reports that he does not use illicit drugs.  Family / Support Systems Marital Status: Separated Patient Roles: Parent, Other (Comment) (Sibling) Children: Tommy [phone] Other Supports: Jamas Lav Riley-sister [phone]-cell Anticipated Caregiver:  Son Ability/Limitations of Caregiver: Son has muscular dystrophy and can only provide supervision level. Sister is not an option and daughter is in [place] Caregiver Availability: 24/7 Family Dynamics: Pt reports he is close with his son and has been staying with him the last 10 days prior to admission. Prior to this he was in a halfway house but got tired of them taking his check every month. His sister has the flu and has not been up here, but also feels she can not provide any care for him at discharge.  Social History Preferred language: English Religion: Christian Cultural Background: No issues Education: High School Read: Yes Write: Yes Employment Status: Retired Freight forwarder Issues: No issues Guardian/Conservator: None-according to MD pt is capable of making his own decisions while here.  Will make sure son is aware of any decisions also.   Abuse/Neglect Physical Abuse: Denies Verbal Abuse: Denies Sexual Abuse: Denies Exploitation of patient/patient's resources: Denies Self-Neglect: Denies  Emotional Status Pt's affect, behavior  adn adjustment status: Pt wants to get stronger in his legs before going home. He feels he is very weak and in a fog since coming into the hospital. He has always been able to take care of himself and wants to again. His son has his own health issues and he does not want to burden him. Recent Psychosocial Issues: health issues-he does go to the MD for follow up Pyschiatric History: History of bipolar takes medications and feels they are helpful, he has not been hospitalized for years now. Stressed the importance of following up with his MD to keep him out of the psych units. Would benefit from neuro-psych seeing while here. Substance Abuse History: History of ETOH and tobacco-he reports he quit both years ago  Patient / Family Perceptions, Expectations & Goals Pt/Family understanding of illness & functional limitations: Pt is not quite  sure reason he is in the hospital, but can inform him and he does listen. He feels he needs to get stronger and mobile while here. His sister has been here, but not since getting the flu, she has spoken with the MD and has a good understanding of his infection. Premorbid pt/family roles/activities: Father, Brother, Friend, retiree, etc Anticipated changes in roles/activities/participation: resume Pt/family expectations/goals: Pt states: " I need to get stronger and be able to get in a wheelchair by myself."  Sister states: " I wish I could help but I can't have him come to my house, it just wouldn't work."  [employer]: Other (Comment) (Was in a halfway house 10 days prior to going to son's) Premorbid Home Care/DME Agencies: None Transportation available at discharge: Unsure, son doesn't drive Resource referrals recommended: Neuropsychology  Discharge Planning Living Arrangements: Children Support Systems: Children, Other relatives, Friends/neighbors Type of Residence: Private residence Insurance Resources: Chartered certified accountant Resources: Radio broadcast assistant Screen Referred: Yes Living Expenses: Lives with family Money Management: Family Does the patient have any problems obtaining your medications?: No Home Management: he and son prior to admission here Patient/Family Preliminary Plans: It is dependent upon his level of care, his son has health issues and can not provide any assistance to Dad. Sister has declined him going to her home as an option. So if pt requires care he may need to go to a NH until can be mod/i or supervision level. He has been to a NH before a long time ago. WIll await team's evaluations. Social Work Anticipated Follow Up Needs: HH/OP, SNF  Clinical Impression Pleasantly confused gentleman who is a poor historian, he is not sure if son is able to ambulate or just uses a wheelchair. Pt had wanted to go to sister's home at discharge, but  this is not an option. His son can not physically assist pt Due to his own health issues. Discharge disposition is dependent upon son's ability to have him at his home or may need to go to a NH for more rehab before going back to son's. Will need to confirm if this is an option-message left for son. Pt would benefit from neuro-psych seeing while here, will make referral. Will work on a safe discharge plan.  Elease Hashimoto 11/30/2015, 1:24 PM

## 2015-11-30 NOTE — Evaluation (Signed)
Occupational Therapy Assessment and Plan  Patient Details  Name: Shawn [name] MRN: [id number] Date of Birth: [birth year]  OT Diagnosis: acute pain and muscle weakness (generalized) Rehab Potential: Rehab Potential (ACUTE ONLY): Good ELOS: 12-14 days   Today's Date: 11/30/2015 OT Individual Time: 1105-1200 OT Individual Time Calculation (min): 55 min     Problem List:  Patient Active Problem List   Diagnosis Date Noted  . Debility 11/29/2015  . Debilitated 11/29/2015  . Paroxysmal atrial fibrillation (HCC)   . Acute on chronic systolic heart failure ([place])   . Adrenal insufficiency ([place])   . Alcohol abuse   . 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 Polysubstance abuse   . Bipolar affective disorder in remission ([place])   . History of ventilator dependency ([place])   . AKI (acute kidney injury) ([place])   . Dysphagia   . Tachycardia   . Tachypnea   . Hypokalemia   . Leukocytosis   . Acute blood loss anemia   . Acute respiratory failure ([place])   . Persistent atrial fibrillation ([place])   . Acute on chronic combined systolic and diastolic CHF (congestive heart failure) ([place])   . Acute respiratory failure with hypoxemia ([place])   . Cardiomyopathy, ischemic 11/19/2015  . Complete heart block ([place]) 11/18/2015  . Malnutrition of moderate degree 11/18/2015  . Pressure ulcer 11/17/2015  . Septic shock ([place]) 11/17/2015  . Pneumatosis coli 11/17/2015  . Gastroenteritis due to norovirus 11/25/2013  . Other pancytopenia ([place]) 11/25/2013  . Dehydration 11/20/2013  . MDD (major depressive disorder) ([place]) 11/17/2013  . Depression 11/17/2013  . Fall 11/16/2013  . Bipolar affective disorder, current episode mixed ([place]) 04/12/2013  . Alcohol abuse with intoxication ([place]) 04/12/2013  . Alcohol dependence ([place]) 04/12/2013  . Bipolar I disorder, most recent episode depressed ([place]) 04/12/2013  . Constipation 08/08/2012  . Hip fracture, left ([place]) 08/06/2012  . Traumatic hematoma of buttock 08/06/2012  .  ETOH abuse 08/06/2012  . H/O: substance abuse 08/06/2012  . Tobacco abuse 08/06/2012  . History of hernia repair 08/06/2012  . Hip fracture, intertrochanteric ([place]) 08/06/2012  . Bipolar disorder, current episode mixed, mild ([place]) 02/04/2012  . Health maintenance examination 10/24/2011  . Hypoxia 10/24/2011  . Prostate cancer screening 09/19/2011  . Tobacco dependence 09/08/2011  . Erectile dysfunction 08/06/2011  . Osteoarthritis of left knee 08/06/2011  . Elevated blood pressure reading without diagnosis of hypertension 08/06/2011  . History of depression 08/06/2011    Past Medical History:  Past Medical History  Diagnosis Date  . Osteoarthritis X years    left knee.  Distant hx (age 53) "dislocated" knee and required surgery, then crush injury to patella required knee cap removal.  . Diverticular disease     "itis" x 2 episodes (last was about 2007)  . Tobacco dependence   . Multiple rib fractures 10/09/10    s/p fall down a ravine--9th and 10th on right, contusion of left 7th and 8th ribs  . Fracture of metatarsal bone(s), closed     3rd, 4th, 5th mid shaft on left foot  . History of substance abuse     cocaine, marijuana, acid, alcohol (in remission since 2010)  . Solar dermatitis     face and ears  . Bipolar disorder [facility])     Has had multiple psych hospitalization in the past, most recently at [facility] 01/31/11-02/04/11.  Says meds made him emotionally blunted. (lamictal and wellbutrin).  . Alcoholism ([place])     "Dry" since 2002;  relapse 2014  . Anxiety and depression   . Vitamin B12 deficiency 10/2013    IF ab positive.  Replacement therapy started end of March 2015  . History of septic shock 11/2015    ? due to small bowel ischemia: hospitalized + surgeries 11/2015  . Anxiety    Past Surgical History:  Past Surgical History  Procedure Laterality Date  . Knee dislocation surgery  1966  . Patellectomy  1968    s/p crush injury  . Inguinal hernia repair       left  . Hernia repair      at 65 years of age  11  . Total knee arthroplasty  11/20/2011    Procedure: TOTAL KNEE ARTHROPLASTY;  Surgeon: Ninetta Lights, MD;  Location: [facility];  Service: Orthopedics;  Laterality: Left;  DR Percell Miller WANTS 90MINUTES FOR THIS CASE  . Inguinal hernia repair  06/15/2012    Procedure: HERNIA REPAIR INGUINAL INCARCERATED;  Surgeon: Edward Jolly, MD;  [facility];  Service: General;  Laterality: Right;  . Bowel resection  06/15/2012    Procedure: SMALL BOWEL RESECTION;  Surgeon: Edward Jolly, MD;  [facility];  Service: General;  Laterality: N/A;  . Femur im nail  08/06/2012    Procedure: INTRAMEDULLARY (IM) NAIL FEMORAL;  Surgeon: Johnn Hai, MD;  [facility];  Service: Orthopedics;  Laterality: Left;  . Laparotomy N/A 11/17/2015    Procedure: EXPLORATORY LAPAROTOMY;  Surgeon: Donnie Mesa, MD;  Location: [facility];  Service: General;  Laterality: N/A;  . Laparotomy N/A 11/19/2015    Procedure: EXPLORATORY LAPAROTOMY OPEN ABDOMEN ABDOMINAL WOUND VAC CHANGE;  Surgeon: Ralene Ok, MD;  Location: [facility];  Service: General;  Laterality: N/A;  . Bowel resection  11/19/2015    Procedure: SMALL BOWEL RESECTION;  Surgeon: Ralene Ok, MD;  Location: [place];  Service: General;;  . Cardiac catheterization N/A 11/20/2015    Procedure: Temporary Wire;  Surgeon: Evans Lance, MD;  Location: [facility];  Service: Cardiovascular;  Laterality: N/A;  . Small bowel repair N/A 11/21/2015    Procedure: SMALL BOWEL REPAIR, SMALL BOWEL ANATAMOSIS AND ABDOMINAL CLOSURE;  Surgeon: Coralie Keens, MD;  Location: [place];  Service: General;  Laterality: N/A;    Assessment & Plan Clinical Impression: Patient is a 65 y.o. right handed male with history of polysubstance abuse alcohol abuse, bipolar disorder, chronic systolic congestive heart failure. Patient lives alone but plans to stay with his sister who is retired. Patient independent prior to  admission. One level home. Presented 11/17/2015 with 3 day history of abdominal pain with nausea and vomiting as well as constipation with fever and chills. Patient noted history of small bowel resection in the past for incarcerated femoral hernia perforation in 2013 and again in 2016. Findings of lactic acid of 12, serum creatinine 6.7. CT of abdomen and pelvis with extensive small bowel pneumatosis, mesenteric and portal venous gas. Underwent exploratory laparotomy 11/17/2015 and found to have viable gut no evidence of ischemic bowel. Wound was left open until 11/21/2015 and underwent closureWith postoperative ventilatory management. Hospital course cardiology services consulted 11/18/2015 for findings of complete heart block/STEMI with heart rate in the 30s developed A. fib with RVR. Echocardiogram with ejection fraction of 45%. Severe inferior lateral hypokinesis to akinesis. Patient did receive a temporary pacemaker for short time. Placed on intravenous amiodarone. Attempted cardioversion failed. Patient extubated 11/26/2015. Creatinine has improved to 1.04.Hypokalemia 2.8-3.2. Potassium supplement added. Acute blood loss anemia  9.4 and monitored.Completed a course of antibiotics for Klebsiella pneumonia. Subcutaneous heparin later added for DVT prophylaxis.   Patient transferred to CIR on 11/29/2015 .    Patient currently requires mod with basic self-care skills secondary to muscle weakness and decreased cardiorespiratoy endurance.  Prior to hospitalization, patient could complete ADLs with independent .  Patient will benefit from skilled intervention to increase independence with basic self-care skills prior to discharge home with care partner.  Anticipate patient will require intermittent supervision and follow up home health.  OT - End of Session Activity Tolerance: Tolerates 30+ min activity with multiple rests Endurance Deficit: Yes Endurance Deficit Description: required multiple rest breaks OT  Assessment Rehab Potential (ACUTE ONLY): Good Barriers to Discharge: Decreased caregiver support Barriers to Discharge Comments: pt lives alone and reports options of family members to live with, will need to firm up d/c plan OT Patient demonstrates impairments in the following area(s): Balance;Endurance;Pain;Safety;Skin Integrity OT Basic ADL's Functional Problem(s): Grooming;Bathing;Dressing;Toileting OT Advanced ADL's Functional Problem(s): Simple Meal Preparation OT Transfers Functional Problem(s): Toilet;Tub/Shower OT Additional Impairment(s): None OT Plan OT Intensity: Minimum of 1-2 x/day, 45 to 90 minutes OT Frequency: 5 out of 7 days OT Duration/Estimated Length of Stay: 12-14 days OT Treatment/Interventions: Balance/vestibular training;Discharge planning;Disease Lawyer;Functional mobility training;Pain management;Patient/family education;Psychosocial support;Self Care/advanced ADL retraining;Skin care/wound managment;Therapeutic Activities;Therapeutic Exercise;UE/LE Strength taining/ROM OT Self Feeding Anticipated Outcome(s): Mod I OT Basic Self-Care Anticipated Outcome(s): Supervision/setup OT Toileting Anticipated Outcome(s): Supervision OT Bathroom Transfers Anticipated Outcome(s): Supervision OT Recommendation Recommendations for Other Services: Neuropsych consult Patient destination: Home Follow Up Recommendations: Home health OT;24 hour supervision/assistance Equipment Recommended: To be determined   Skilled Therapeutic Intervention OT eval completed with discussion of rehab process, OT purpose, POC, goals, and ELOS.  ADL assessment completed with UB bathing seated at sink and perineal hygiene at bed level due to incontinent BM and impaired standing balance and tolerance.  Pt required multiple rest breaks and requested to return to bed at end of session.  Mod assist stand pivot due to fatigue, requiring tactile cues and  blocking at Lt knee.  Limited eval secondary to decreased endurance, incontinent BM, and no clothing present.  OT Evaluation Precautions/Restrictions  Precautions Precautions: Fall Pain Pain Assessment Pain Assessment: 0-10 Pain Score: 8  Pain Type: Acute pain;Surgical pain Pain Location: Abdomen Pain Orientation: Mid Pain Descriptors / Indicators: Aching Pain Onset: On-going Pain Intervention(s):  (pre-medicated) Home Living/Prior Functioning Home Living Available Help at Discharge: Family, Available 24 hours/day Type of Home: House Home Access: Ramped entrance Home Layout: One level Bathroom Shower/Tub: Multimedia programmer: Handicapped height Bathroom Accessibility: Yes Additional Comments: lives alone, but plans to stay with his sister who is retired or pt reports would like to d/c to his mother's home Prior Function Level of Independence: Independent with basic ADLs, Independent with homemaking with ambulation, Requires assistive device for independence  Able to Take Stairs?: Yes Leisure: Hobbies-yes (Comment) (enjoys wordworking) ADL  See Function Navigator Vision/Perception  Vision- History Baseline Vision/History: Wears glasses Wears Glasses: Reading only (and for watching tv) Patient Visual Report: No change from baseline Vision- Assessment Vision Assessment?: No apparent visual deficits  Cognition Overall Cognitive Status: Within Functional Limits for tasks assessed Arousal/Alertness: Awake/alert Orientation Level: Person;Place;Situation Person: Oriented Place: Oriented Situation: Oriented Year: 2017 Month: March Day of Week: Incorrect (Friday) Memory: Appears intact Immediate Memory Recall: Sock;Blue;Bed Memory Recall: Sock;Blue Memory Recall Sock: Without Cue Memory Recall Blue: Without Cue Attention: Selective Selective Attention: Appears intact Awareness: Appears intact Safety/Judgment:  Appears intact Sensation Sensation Light Touch:  Appears Intact Hot/Cold: Appears Intact Proprioception: Appears Intact Coordination Fine Motor Movements are Fluid and Coordinated: Yes Finger Nose Finger Test: [facility] Extremity/Trunk Assessment RUE Assessment RUE Assessment: Within Functional Limits (generalized weakness, grossly 4/5 and loose gross grasp) LUE Assessment LUE Assessment: Within Functional Limits (generalized weakness, Lt > Rt, loose gross grasp)   See Function Navigator for Current Functional Status.   Refer to Care Plan for Long Term Goals  Recommendations for other services: Neuropsych  Discharge Criteria: Patient will be discharged from OT if patient refuses treatment 3 consecutive times without medical reason, if treatment goals not met, if there is a change in medical status, if patient makes no progress towards goals or if patient is discharged from hospital.  The above assessment, treatment plan, treatment alternatives and goals were discussed and mutually agreed upon: by patient  Simonne Come 11/30/2015, 12:06 PM

## 2015-11-30 NOTE — Progress Notes (Signed)
Occupational Therapy Session Note  Patient Details  Name: Shawn [name] MRN: [id number] Date of Birth: 23-Nov-1950  Today's Date: 11/30/2015 OT Individual Time: [id number] OT Individual Time Calculation (min): 28 min    Short Term Goals: Week 1:  OT Short Term Goal 1 (Week 1): Pt will complete bathing at sit > stand level with min assist OT Short Term Goal 2 (Week 1): Pt will complete UB bathing with min assist OT Short Term Goal 3 (Week 1): Pt will complete LB dressing with min assist at sit > stand level OT Short Term Goal 4 (Week 1): Pt will complete toilet transfers with min assist with LRAD OT Short Term Goal 5 (Week 1): Pt will complete 1 grooming task in standing with min assist  Skilled Therapeutic Interventions/Progress Updates:  Treatment session with focus on functional transfers and standing balance.  Upon arrival pt tossing and turning in bed throwing blankets off.  Pt reports needing to use toilet.  Completed stand pivot transfer to [facility] next to bed with min assist.  Noted pt to be incontinent of bowel and bladder.  Pt required total assist with clothing management and hygiene, but able to maintain standing balance for ~1 minute at a time with RW.  Completed hygiene and donned fresh brief, pt unable to attempt to pull brief over hips requiring assist.  Returned to bed and requested to remain in bed.  Therapy Documentation Precautions:  Precautions Precautions: Fall Restrictions Weight Bearing Restrictions: No General:   Vital Signs: Therapy Vitals Temp: 100.2 F (37.9 C) Temp Source: Oral Pulse Rate: 65 Resp: 18 BP: (!) 114/58 mmHg Patient Position (if appropriate): Lying Oxygen Therapy SpO2: 98 % O2 Device: Not Delivered Pain: Pain Assessment Pain Assessment: Faces Faces Pain Scale: Hurts even more Pain Type: Acute pain Pain Location: Generalized Pain Descriptors / Indicators: Aching Pain Intervention(s): RN made aware  See Function Navigator for Current  Functional Status.   Therapy/Group: Individual Therapy  Simonne Come 11/30/2015, 3:41 PM

## 2015-11-30 NOTE — Evaluation (Signed)
Speech Language Pathology Assessment and Plan  Patient Details  Name: Shawn [name] MRN: [id number] Date of Birth: 1951/01/07   Today's Date: 11/30/2015 SLP Individual Time: 0800-0845 SLP Individual Time Calculation (min): 45 min   Problem List:  Patient Active Problem List   Diagnosis Date Noted  . Debility 11/29/2015  . Debilitated 11/29/2015  . Paroxysmal atrial fibrillation (HCC)   . Acute on chronic systolic heart failure ([place])   . Adrenal insufficiency ([place])   . Alcohol abuse   . 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 Polysubstance abuse   . Bipolar affective disorder in remission ([place])   . History of ventilator dependency ([place])   . AKI (acute kidney injury) ([place])   . Dysphagia   . Tachycardia   . Tachypnea   . Hypokalemia   . Leukocytosis   . Acute blood loss anemia   . Acute respiratory failure ([place])   . Persistent atrial fibrillation ([place])   . Acute on chronic combined systolic and diastolic CHF (congestive heart failure) ([place])   . Acute respiratory failure with hypoxemia ([place])   . Cardiomyopathy, ischemic 11/19/2015  . Complete heart block ([place]) 11/18/2015  . Malnutrition of moderate degree 11/18/2015  . Pressure ulcer 11/17/2015  . Septic shock ([place]) 11/17/2015  . Pneumatosis coli 11/17/2015  . Gastroenteritis due to norovirus 11/25/2013  . Other pancytopenia ([place]) 11/25/2013  . Dehydration 11/20/2013  . MDD (major depressive disorder) ([place]) 11/17/2013  . Depression 11/17/2013  . Fall 11/16/2013  . Bipolar affective disorder, current episode mixed ([place]) 04/12/2013  . Alcohol abuse with intoxication ([place]) 04/12/2013  . Alcohol dependence ([place]) 04/12/2013  . Bipolar I disorder, most recent episode depressed ([place]) 04/12/2013  . Constipation 08/08/2012  . Hip fracture, left ([place]) 08/06/2012  . Traumatic hematoma of buttock 08/06/2012  . ETOH abuse 08/06/2012  . H/O: substance abuse 08/06/2012  . Tobacco abuse 08/06/2012  . History of hernia repair 08/06/2012   . Hip fracture, intertrochanteric ([place]) 08/06/2012  . Bipolar disorder, current episode mixed, mild ([place]) 02/04/2012  . Health maintenance examination 10/24/2011  . Hypoxia 10/24/2011  . Prostate cancer screening 09/19/2011  . Tobacco dependence 09/08/2011  . Erectile dysfunction 08/06/2011  . Osteoarthritis of left knee 08/06/2011  . Elevated blood pressure reading without diagnosis of hypertension 08/06/2011  . History of depression 08/06/2011   Past Medical History:  Past Medical History  Diagnosis Date  . Osteoarthritis X years    left knee.  Distant hx (age 34) "dislocated" knee and required surgery, then crush injury to patella required knee cap removal.  . Diverticular disease     "itis" x 2 episodes (last was about 2007)  . Tobacco dependence   . Multiple rib fractures 10/09/10    s/p fall down a ravine--9th and 10th on right, contusion of left 7th and 8th ribs  . Fracture of metatarsal bone(s), closed     3rd, 4th, 5th mid shaft on left foot  . History of substance abuse     cocaine, marijuana, acid, alcohol (in remission since 2010)  . Solar dermatitis     face and ears  . Bipolar disorder [facility])     Has had multiple psych hospitalization in the past, most recently at [facility] 01/31/11-02/04/11.  Says meds made him emotionally blunted. (lamictal and wellbutrin).  . Alcoholism ([place])     "Dry" since 2002; relapse 2014  . Anxiety and depression   . Vitamin B12 deficiency 10/2013    IF ab positive.  Replacement therapy started end of March 2015  . History of septic shock 11/2015    ? due to small bowel ischemia: hospitalized + surgeries 11/2015  . Anxiety    Past Surgical History:  Past Surgical History  Procedure Laterality Date  . Knee dislocation surgery  1966  . Patellectomy  1968    s/p crush injury  . Inguinal hernia repair      left  . Hernia repair      at 65 years of age  40  . Total knee arthroplasty  11/20/2011    Procedure: TOTAL KNEE  ARTHROPLASTY;  Surgeon: Ninetta Lights, MD;  Location: [place];  Service: Orthopedics;  Laterality: Left;  DR Percell Miller WANTS 90MINUTES FOR THIS CASE  . Inguinal hernia repair  06/15/2012    Procedure: HERNIA REPAIR INGUINAL INCARCERATED;  Surgeon: Edward Jolly, MD;  Location: [facility];  Service: General;  Laterality: Right;  . Bowel resection  06/15/2012    Procedure: SMALL BOWEL RESECTION;  Surgeon: Edward Jolly, MD;  Location: [facility];  Service: General;  Laterality: N/A;  . Femur im nail  08/06/2012    Procedure: INTRAMEDULLARY (IM) NAIL FEMORAL;  Surgeon: Johnn Hai, MD;  Location: [facility];  Service: Orthopedics;  Laterality: Left;  . Laparotomy N/A 11/17/2015    Procedure: EXPLORATORY LAPAROTOMY;  Surgeon: Donnie Mesa, MD;  Location: [place];  Service: General;  Laterality: N/A;  . Laparotomy N/A 11/19/2015    Procedure: EXPLORATORY LAPAROTOMY OPEN ABDOMEN ABDOMINAL WOUND VAC CHANGE;  Surgeon: Ralene Ok, MD;  Location: [place];  Service: General;  Laterality: N/A;  . Bowel resection  11/19/2015    Procedure: SMALL BOWEL RESECTION;  Surgeon: Ralene Ok, MD;  Location: [place];  Service: General;;  . Cardiac catheterization N/A 11/20/2015    Procedure: Temporary Wire;  Surgeon: Evans Lance, MD;  Location: [place] [facility];  Service: Cardiovascular;  Laterality: N/A;  . Small bowel repair N/A 11/21/2015    Procedure: SMALL BOWEL REPAIR, SMALL BOWEL ANATAMOSIS AND ABDOMINAL CLOSURE;  Surgeon: Coralie Keens, MD;  Location: [place];  Service: General;  Laterality: N/A;    Assessment / Plan / Recommendation Clinical Impression   Shawn [name] is a 65 y.o. right handed male with history of polysubstance abuse, alcohol abuse, bipolar disorder, chronic systolic congestive heart failure.  Presented 11/17/2015 with 3 day history of abdominal pain with nausea and vomiting as well as constipation with fever and chills. Patient noted history of small bowel resection in the past for  incarcerated femoral hernia perforation in 2013 and again in 2016. Findings of lactic acid of 12, serum creatinine 6.7. CT of abdomen and pelvis with extensive small bowel pneumatosis, mesenteric and portal venous gas. Underwent exploratory laparotomy 11/17/2015 and found to have viable gut no evidence of ischemic bowel.  Patient extubated 11/26/2015. Diet advanced to soft. Patient was admitted for a comprehensive rehabilitation program:   Pt presents with grossly intact swallowing function with the exception of impaired mastication of regular solids due to mouth pain and overall poor oral condition.  No overt s/s of aspiration evident with solids or liquids.  Pt was able to clear solids from the oral cavity post swallow without difficulty.  Recommend that pt remain on soft diet but may advanced as tolerated once oral pain is better controlled.  No further ST needs indicated at this time.    Skilled Therapeutic Interventions          Bedside swallow  evaluation completed with results and recommendations reviewed with patient.     SLP Assessment  Patient does not need any further Speech Lanaguage Pathology Services    Recommendations  Follow up Recommendations: None Equipment Recommended: None recommended by SLP           Pain Pain Assessment Pain Assessment: Faces Pain Score: 8  Faces Pain Scale: Hurts even more Pain Type: Acute pain Pain Location: Generalized Pain Orientation: Mid Pain Descriptors / Indicators: Aching Pain Onset: On-going Pain Intervention(s): RN made aware  Prior Functioning Cognitive/Linguistic Baseline: Information not available Type of Home: House  Lives With: Alone Available Help at Discharge: Family;Available 24 hours/day  Function:  Eating Eating Eating activity did not occur: N/A Modified Consistency Diet: Yes Eating Assist Level: Swallowing techniques: self managed           Cognition Comprehension Comprehension assist level: Understands basic 75  - 89% of the time/ requires cueing 10 - 24% of the time  Expression   Expression assist level: Expresses basic 75 - 89% of the time/requires cueing 10 - 24% of the time. Needs helper to occlude trach/needs to repeat words.  Social Interaction Social Interaction assist level: Interacts appropriately 75 - 89% of the time - Needs redirection for appropriate language or to initiate interaction.  Problem Solving Problem solving assist level: Solves basic 25 - 49% of the time - needs direction more than half the time to initiate, plan or complete simple activities  Memory Memory assist level: Recognizes or recalls 50 - 74% of the time/requires cueing 25 - 49% of the time   Refer to Care Plan for Long Term Goals  Recommendations for other services: None  Discharge Criteria: Patient will be discharged from SLP if patient refuses treatment 3 consecutive times without medical reason, if treatment goals not met, if there is a change in medical status, if patient makes no progress towards goals or if patient is discharged from hospital.  The above assessment, treatment plan, treatment alternatives and goals were discussed and mutually agreed upon: by patient  Emilio Math 11/30/2015, 12:27 PM

## 2015-11-30 NOTE — Telephone Encounter (Signed)
Tried calling pt NA, left message for pt to call back.

## 2015-11-30 NOTE — Progress Notes (Signed)
Physical Therapy Session Note  Patient Details  Name: Shawn [name] MRN: [id number] Date of Birth: 06-Jul-1951  Today's Date: 11/30/2015 PT Individual Time: 1520-1540 PT Individual Time Calculation (min): 20 min   Short Term Goals: Week 1:  PT Short Term Goal 1 (Week 1): Pt will perform stand pivot transfer with S PT Short Term Goal 2 (Week 1): Pt will perform bed mobility with S PT Short Term Goal 3 (Week 1): Pt will perform gait with LRAD x100' with CGA and LRAD PT Short Term Goal 4 (Week 1): Pt will initiate stair training  Skilled Therapeutic Interventions/Progress Updates:  Pt seen for make-up session from 15 min missed with SLP earlier today. Pt denies pain and agreeable to treatment. Supine>sit with S and bedrails. Pt educated in squat pivot transfer to w/c; performed with S and min verbal cues. W/c propulsion 2x150' with S. Squat pivot transfer w/c <>car with minA. Required cues for completing turn prior to sitting due to mild impulsivity. Returned to room and requested to remain seated in w/c; quick release belt applied and RN alerted to pt request for pain medication for abdominal discomfort.  Therapy Documentation Precautions:  Precautions Precautions: Fall Restrictions Weight Bearing Restrictions: No Pain: Pain Assessment Pain Assessment: No/denies pain Faces Pain Scale: Hurts even more Pain Type: Acute pain Pain Location: Generalized Pain Descriptors / Indicators: Aching Pain Intervention(s): RN made aware   See Function Navigator for Current Functional Status.   Therapy/Group: Individual Therapy  Luberta Mutter 11/30/2015, 3:44 PM

## 2015-11-30 NOTE — Progress Notes (Signed)
65 y.o. right handed male with history of polysubstance abuse alcohol abuse, bipolar disorder, chronic systolic congestive heart failure. Patient lives alone but plans to stay with his sister who is retired. Patient independent prior to admission. One level home. Presented 11/17/2015 with 3 day history of abdominal pain with nausea and vomiting as well as constipation with fever and chills. Patient noted history of small bowel resection in the past for incarcerated femoral hernia perforation in 2013 and again in 2016. Findings of lactic acid of 12, serum creatinine 6.7. CT of abdomen and pelvis with extensive small bowel pneumatosis, mesenteric and portal venous gas. Underwent exploratory laparotomy 11/17/2015 and found to have viable gut no evidence of ischemic bowel. Wound was left open until 11/21/2015 and underwent closureWith postoperative ventilatory management. Hospital course cardiology services consulted 11/18/2015 for findings of complete heart block/STEMI with heart rate in the 30s developed A. fib with RVR. Echocardiogram with ejection fraction of 45%. Severe inferior lateral hypokinesis to akinesis  Subjective/Complaints: Patient complains of pain at the corner of his mouth left side greater than right side.no pain with chewing Has not received routine dental care.  ROS-postsurgical abdominal pain, having bowel movements, condom cath for bladder, no chest pain or shortness of breath no nausea or vomiting  Objective: Vital Signs: Blood pressure 126/73, pulse 68, temperature 98 F (36.7 C), temperature source Oral, resp. rate 18, height '5\' 11"'$  (1.803 m), weight 77.701 kg (171 lb 4.8 oz), SpO2 97 %. No results found. Results for orders placed or performed during the hospital encounter of 11/29/15 (from the past 72 hour(s))  Glucose, capillary     Status: None   Collection Time: 11/29/15  4:14 PM  Result Value Ref Range   Glucose-Capillary 80 65 - 99 mg/dL   Comment 1 Notify RN   CBC      Status: Abnormal   Collection Time: 11/29/15  4:29 PM  Result Value Ref Range   WBC 7.3 4.0 - 10.5 K/uL   RBC 3.20 (L) 4.22 - 5.81 MIL/uL   Hemoglobin 9.5 (L) 13.0 - 17.0 g/dL   HCT 29.1 (L) 39.0 - 52.0 %   MCV 90.9 78.0 - 100.0 fL   MCH 29.7 26.0 - 34.0 pg   MCHC 32.6 30.0 - 36.0 g/dL   RDW 14.6 11.5 - 15.5 %   Platelets 691 (H) 150 - 400 K/uL  Creatinine, serum     Status: None   Collection Time: 11/29/15  4:29 PM  Result Value Ref Range   Creatinine, Ser 1.08 0.61 - 1.24 mg/dL   GFR calc non Af Amer >60 >60 mL/min   GFR calc Af Amer >60 >60 mL/min    Comment: (NOTE) The eGFR has been calculated using the CKD EPI equation. This calculation has not been validated in all clinical situations. eGFR's persistently <60 mL/min signify possible Chronic Kidney Disease.   Glucose, capillary     Status: Abnormal   Collection Time: 11/29/15 10:41 PM  Result Value Ref Range   Glucose-Capillary 117 (H) 65 - 99 mg/dL  Glucose, capillary     Status: None   Collection Time: 11/30/15  1:07 AM  Result Value Ref Range   Glucose-Capillary 96 65 - 99 mg/dL  CBC WITH DIFFERENTIAL     Status: Abnormal   Collection Time: 11/30/15  4:06 AM  Result Value Ref Range   WBC 7.4 4.0 - 10.5 K/uL   RBC 3.15 (L) 4.22 - 5.81 MIL/uL   Hemoglobin 9.2 (L) 13.0 - 17.0  g/dL   HCT 28.9 (L) 39.0 - 52.0 %   MCV 91.7 78.0 - 100.0 fL   MCH 29.2 26.0 - 34.0 pg   MCHC 31.8 30.0 - 36.0 g/dL   RDW 15.0 11.5 - 15.5 %   Platelets 757 (H) 150 - 400 K/uL   Neutrophils Relative % 79 %   Neutro Abs 5.8 1.7 - 7.7 K/uL   Lymphocytes Relative 14 %   Lymphs Abs 1.0 0.7 - 4.0 K/uL   Monocytes Relative 6 %   Monocytes Absolute 0.4 0.1 - 1.0 K/uL   Eosinophils Relative 2 %   Eosinophils Absolute 0.2 0.0 - 0.7 K/uL   Basophils Relative 0 %   Basophils Absolute 0.0 0.0 - 0.1 K/uL  Comprehensive metabolic panel     Status: Abnormal   Collection Time: 11/30/15  4:06 AM  Result Value Ref Range   Sodium 142 135 - 145 mmol/L    Potassium 4.7 3.5 - 5.1 mmol/L    Comment: DELTA CHECK NOTED SPECIMEN HEMOLYZED. HEMOLYSIS MAY AFFECT INTEGRITY OF RESULTS.    Chloride 111 101 - 111 mmol/L   CO2 23 22 - 32 mmol/L   Glucose, Bld 88 65 - 99 mg/dL   BUN 10 6 - 20 mg/dL   Creatinine, Ser 1.16 0.61 - 1.24 mg/dL   Calcium 8.1 (L) 8.9 - 10.3 mg/dL   Total Protein 4.6 (L) 6.5 - 8.1 g/dL   Albumin 1.9 (L) 3.5 - 5.0 g/dL   AST 87 (H) 15 - 41 U/L   ALT 69 (H) 17 - 63 U/L   Alkaline Phosphatase 248 (H) 38 - 126 U/L   Total Bilirubin 1.4 (H) 0.3 - 1.2 mg/dL   GFR calc non Af Amer >60 >60 mL/min   GFR calc Af Amer >60 >60 mL/min    Comment: (NOTE) The eGFR has been calculated using the CKD EPI equation. This calculation has not been validated in all clinical situations. eGFR's persistently <60 mL/min signify possible Chronic Kidney Disease.    Anion gap 8 5 - 15  Glucose, capillary     Status: None   Collection Time: 11/30/15  5:37 AM  Result Value Ref Range   Glucose-Capillary 88 65 - 99 mg/dL  Glucose, capillary     Status: None   Collection Time: 11/30/15  7:14 AM  Result Value Ref Range   Glucose-Capillary 97 65 - 99 mg/dL      General: No acute distress HEENT: Poor dentition, granulation tissue at corner of oral stoma bilaterally Mood and affect are appropriate Heart: Regular rate and rhythm no rubs murmurs or extra sounds Lungs: Clear to auscultation, breathing unlabored, no rales or wheezes Abdomen: Positive bowel sounds, soft nontender to palpation, nondistended,midline abdominal incision packed with wet to dry gauze, good granulation tissue, no purulent drainage, no surrounding erythema or maceration. Extremities: No clubbing, cyanosis, or edema Skin: No evidence of breakdown, no evidence of rash Neurologic: Cranial nerves II through XII intact, motor strength is 4/5 in bilateral deltoid, bicep, tricep, grip, hip flexor, knee extensors, ankle dorsiflexor and plantar flexor Sensory exam normal sensation to  light touch and proprioception in bilateral upper and lower extremities Cerebellar exam normal finger to nose to finger as well as heel to shin in bilateral upper and lower extremities Musculoskeletal: Full range of motion in all 4 extremities. No joint swelling   Assessment/Plan: 1. Functional deficits secondary to respiratory failure/small bowel repair/septic shock Burman [facility]smith pneumonia /multiple medical which require 3+ hours per day of  interdisciplinary therapy in a comprehensive inpatient rehab setting. Physiatrist is providing close team supervision and 24 hour management of active medical problems listed below. Physiatrist and rehab team continue to assess barriers to discharge/monitor patient progress toward functional and medical goals. FIM:                                  Medical Problem List and Plan: 1.  Debilitation secondary to respiratory failure/small bowel repair/septic shock Burman [place]smith pneumonia /multiple medical-initiate rehabilitation program 2.  DVT Prophylaxis/Anticoagulation: Subcutaneous heparin. Monitor for any bleeding episode 3. Pain Management: Oxycodone as needed 4. Acute blood loss anemia. Follow-up CBC 5. Neuropsych: This patient is capable of making decisions on his own behalf. 6. Skin/Wound Care: Routine skin checks 7. Fluids/Electrolytes/Nutrition: Routine I&O with follow-up chemistries 8. A. fib with RVR/complete heart block. Continue amiodarone.Cardiac rate controlled. 9. Acute on chronic systolic congestive heart failure. Monitor for any signs of fluid overload. Continue Lasix 40 mg daily as directed, no signs of peripheral edema 10. Adrenal insufficiency. Weaning Solu-Cortef 11. Mood/bipolar disorder.  Lamictal 200 mg daily at bedtime as well as Seroquel 50 mg daily at bedtime and titrate up the home dose of 300 mg as needed. 12. Decreased nutritional storage. Advance diet as tolerated. 13. Alcohol polysubstance abuse. Counseling.  Monitor for any signs of withdrawal, neuropsych evaluation 14. Hypokalemia. Continue potassium supplement. Follow-up chemistries 15. Oral ulcer, both on mucosal and oral commissure,angular chelitis, will start antifungal cream 16. Hypoalbuminemia Protostat, dietary consult LOS (Days) 1 A FACE TO FACE EVALUATION WAS PERFORMED  Kerly Rigsbee E 11/30/2015, 8:22 AM

## 2015-12-01 ENCOUNTER — Inpatient Hospital Stay (HOSPITAL_COMMUNITY): Payer: Medicare Other | Admitting: Occupational Therapy

## 2015-12-01 ENCOUNTER — Inpatient Hospital Stay (HOSPITAL_COMMUNITY): Payer: Medicare Other | Admitting: Physical Therapy

## 2015-12-01 LAB — GLUCOSE, CAPILLARY
GLUCOSE-CAPILLARY: 86 mg/dL (ref 65–99)
Glucose-Capillary: 96 mg/dL (ref 65–99)
Glucose-Capillary: 113 mg/dL — ABNORMAL HIGH (ref 65–99)
Glucose-Capillary: 115 mg/dL — ABNORMAL HIGH (ref 65–99)

## 2015-12-01 NOTE — Progress Notes (Signed)
Shawn Diones, RN Rehab Admission Coordinator Signed Physical Medicine and Rehabilitation PMR Pre-admission 11/29/2015 10:38 AM  Related encounter: ED to Hosp-Admission (Discharged) from 11/17/2015 in [place] Collapse All   PMR Admission Coordinator Pre-Admission Assessment  Patient: Shawn [name] is an 65 y.o., male MRN: [id number] DOB: 1951/06/01 Height: '5\' 11"'$  (180.3 cm) Weight: 76.794 kg (169 lb 4.8 oz)  Insurance Information HMO: No PPO: PCP: IPA: 80/20: OTHER:  PRIMARY: Medicare A/B Policy#: [id number] Subscriber: Shawn [name] CM Name: Phone#: Fax#:  Pre-Cert#: Employer: Retired Benefits: Phone #: Name: Checked in [place]. Date: 10/03/13 Deduct: $1316 Out of Pocket Max: none Life Max: unlimited CIR: 100% SNF: 100 days Outpatient: 80% Co-Pay: 20% Home Health: 100% Co-Pay: none DME: 80% Co-Pay: 20% Providers: patient's choice  Medicaid Application Date: Case Manager:  Disability Application Date: Case Worker:   Emergency Contact Information Contact Information    Name Relation Home Work [place] Son [phone]  [phone]   Elwin Sleight Sister   [phone]     Current Medical History  Patient Admitting Diagnosis: Debility  History of Present Illness: A 65 y.o. right handed male with history of polysubstance abuse, alcohol abuse, bipolar disorder, chronic systolic congestive heart failure. Patient lives alone but plans to stay with his sister who is retired. Patient independent prior to admission. One level home. Presented 11/17/2015 with 3 day history of abdominal pain with nausea and vomiting as well as  constipation with fever and chills. Patient noted history of small bowel resection in the past for incarcerated femoral hernia perforation in 2013 and again in 2016. Findings of lactic acid of 12, serum creatinine 6.7. CT of abdomen and pelvis with extensive small bowel pneumatosis, mesenteric and portal venous gas. Underwent exploratory laparotomy 11/17/2015 and found to have viable gut no evidence of ischemic bowel. Wound was left open until 11/21/2015 and underwent closure with postoperative ventilatory management. Hospital course cardiology services consulted 11/18/2015 for findings of complete heart block/STEMI with heart rate in the 30s. Developed A. fib with RVR. Echocardiogram with ejection fraction of 45%. Severe inferior lateral hypokinesis to akinesis. Patient did receive a temporary pacemaker for short time. Placed on intravenous amiodarone. Attempted cardioversion failed. Patient extubated 11/26/2015. Creatinine has improved to 1.04. Acute blood loss anemia 9.4 and monitored. Completed a course of antibiotics for Klebsiella pneumonia. Subcutaneous heparin later added for DVT prophylaxis. K+ 2.8 and attending MD addressing results today. Currently on a Full liquid diet and advanced as tolerated. Patient to be admitted for a comprehensive inpatient rehabilitation program.   Past Medical History  Past Medical History  Diagnosis Date  . Osteoarthritis X years    left knee. Distant hx (age 35) "dislocated" knee and required surgery, then crush injury to patella required knee cap removal.  . Diverticular disease     "itis" x 2 episodes (last was about 2007)  . Tobacco dependence   . Multiple rib fractures 10/09/10    s/p fall down a ravine--9th and 10th on right, contusion of left 7th and 8th ribs  . Fracture of metatarsal bone(s), closed     3rd, 4th, 5th mid shaft on left foot  . History of substance abuse     cocaine, marijuana, acid, alcohol (in  remission since 2010)  . Solar dermatitis     face and ears  . Bipolar disorder ([place])     Has had multiple psych hospitalization in the past, most recently  at [facility] 01/31/11-02/04/11. Says meds made him emotionally blunted. (lamictal and wellbutrin).  . Alcoholism ([place])     "Dry" since 2002; relapse 2014  . Anxiety and depression   . Vitamin B12 deficiency 10/2013    IF ab positive. Replacement therapy started end of March 2015  . History of septic shock 11/2015    ? due to small bowel ischemia: hospitalized + surgeries 11/2015  . Anxiety     Family History  family history includes Arthritis in his father and mother; Cancer in his brother.  Prior Rehab/Hospitalizations: Had therapy after a leg fracture a couple years ago and had a TKR about 5 years ago with therapy post op.  Has the patient had major surgery during 100 days prior to admission? Unknown. Son reports hernia surgery recently, but not sure how long ago.  Current Medications   Current facility-administered medications:  . acetaminophen (TYLENOL) tablet 650 mg, 650 mg, Oral, Q4H PRN, Evans Lance, MD . albuterol (PROVENTIL) (2.5 MG/3ML) 0.083% nebulizer solution 2.5 mg, 2.5 mg, Nebulization, Q3H PRN, Donita Brooks, NP . amiodarone (PACERONE) tablet 400 mg, 400 mg, Oral, BID, Javier Glazier, MD, 400 mg at 11/28/15 2235 . antiseptic oral rinse solution (CORINZ), 7 mL, Mouth Rinse, QID, Juanito Doom, MD, 7 mL at 11/28/15 1600 . aspirin chewable tablet 81 mg, 81 mg, Oral, Daily, Javier Glazier, MD, 81 mg at 11/28/15 0957 . chlorhexidine gluconate (SAGE KIT) (PERIDEX) 0.12 % solution 15 mL, 15 mL, Mouth Rinse, BID, Juanito Doom, MD, 15 mL at 11/29/15 0857 . feeding supplement (BOOST / RESOURCE BREEZE) liquid 1 Container, 1 Container, Oral, TID BM, Brand Males, MD, 1 Container at 11/28/15 2235 . furosemide (LASIX) tablet 40 mg, 40 mg, Oral, Daily,  Javier Glazier, MD, 40 mg at 11/28/15 0957 . Gerhardt's butt cream, , Topical, PRN, Brand Males, MD, 1 application at 87/68/11 1002 . heparin injection 5,000 Units, 5,000 Units, Subcutaneous, 3 times per day, Raylene Miyamoto, MD, 5,000 Units at 11/29/15 [phone] . hydrocortisone sodium succinate (SOLU-CORTEF) 100 MG injection 50 mg, 50 mg, Intravenous, Daily, Thurnell Lose, MD . insulin aspart (novoLOG) injection 0-9 Units, 0-9 Units, Subcutaneous, 6 times per day, Juanito Doom, MD, 1 Units at 11/26/15 0335 . lamoTRIgine (LAMICTAL) tablet 200 mg, 200 mg, Oral, QHS, Thurnell Lose, MD, 200 mg at 11/28/15 2234 . ondansetron (ZOFRAN) injection 4 mg, 4 mg, Intravenous, Q6H PRN, Evans Lance, MD, 4 mg at 11/29/15 0649 . ondansetron (ZOFRAN-ODT) disintegrating tablet 4 mg, 4 mg, Oral, Q6H PRN **OR** [DISCONTINUED] ondansetron (ZOFRAN) injection 4 mg, 4 mg, Intravenous, Q6H PRN, Greer Pickerel, MD . oxyCODONE-acetaminophen (PERCOCET/ROXICET) 5-325 MG per tablet 1-2 tablet, 1-2 tablet, Oral, Q4H PRN, Judeth Horn, MD, 2 tablet at 11/29/15 [phone] . pantoprazole (PROTONIX) EC tablet 40 mg, 40 mg, Oral, Daily, Javier Glazier, MD, 40 mg at 11/28/15 0957 . potassium chloride 10 mEq in 100 mL IVPB, 10 mEq, Intravenous, Q1 Hr x 6, Thurnell Lose, MD, 10 mEq at 11/29/15 1106 . potassium chloride SA (K-DUR,KLOR-CON) CR tablet 40 mEq, 40 mEq, Oral, Q4H, Thurnell Lose, MD, 40 mEq at 11/29/15 1023 . QUEtiapine (SEROQUEL) tablet 50 mg, 50 mg, Oral, QHS, Thurnell Lose, MD, 50 mg at 11/28/15 2235 . white petrolatum (VASELINE) gel, , Topical, PRN, Grace Bushy Minor, NP, 0.2 application at 59/74/16 0441  Patients Current Diet: DIET SOFT Room service appropriate?: Yes; Fluid consistency:: Thin  Precautions / Restrictions Precautions  Precautions: Fall Precaution Comments: watch HR Restrictions Weight Bearing Restrictions: No   Has the patient had 2 or more falls or a fall with injury  in the past year?Yes. Several falls and was not able to get up after the last fall.  Prior Activity Level Limited Community (1-2x/wk): Went out 1-2 X a week. Walked up to nearby church to see service schedule.  Home Assistive Devices / Equipment Home Assistive Devices/Equipment: Cane (specify quad or straight), Walker (specify type) Home Equipment: None  Prior Device Use: Indicate devices/aids used by the patient prior to current illness, exacerbation or injury? Cane  Prior Functional Level Prior Function Level of Independence: Independent  Self Care: Did the patient need help bathing, dressing, using the toilet or eating? Independent  Indoor Mobility: Did the patient need assistance with walking from room to room (with or without device)? Independent  Stairs: Did the patient need assistance with internal or external stairs (with or without device)? Independent  Functional Cognition: Did the patient need help planning regular tasks such as shopping or remembering to take medications? Independent  Current Functional Level Cognition  Overall Cognitive Status: No family/caregiver present to determine baseline cognitive functioning Orientation Level: Oriented to person, Disoriented to place, Disoriented to time, Disoriented to situation   Extremity Assessment (includes Sensation/Coordination)  Upper Extremity Assessment: Generalized weakness  Lower Extremity Assessment: LLE deficits/detail LLE Deficits / Details: h/o surgery with multiple scars, noted leg length discrepancy (pt reports doesn't wear built up shoe); AAROM grossly limited knee flexion to about 80, strength hip flexion 3+/5, knee extension 3-/5    ADLs       Mobility  Overal bed mobility: Needs Assistance Bed Mobility: Supine to Sit Supine to sit: Mod assist General bed mobility comments: cues to roll and use rail and even with assist comes up straight, assist for legs and trunk    Transfers   Overall transfer level: Needs assistance Equipment used: Rolling walker (2 wheeled) Transfers: Sit to/from Stand, [employer] Transfers Sit to Stand: Mod assist, +2 physical assistance Stand pivot transfers: Mod assist, +2 physical assistance General transfer comment: assist from bed increased time and cues for hand placement, continued difficulty getting L LE under him; able to stand step to recliner with +2 A and increased time with walker, but fatigued quickly requesting to sit prior to lining up with chair so assisted for safety    Ambulation / Gait / Stairs / Wheelchair Mobility  Ambulation/Gait General Gait Details: unable    Posture / Balance Dynamic Sitting Balance Sitting balance - Comments: posterior bias initially in sitting Balance Overall balance assessment: Needs assistance Sitting-balance support: Bilateral upper extremity supported, Feet supported Sitting balance-Leahy Scale: Poor Sitting balance - Comments: posterior bias initially in sitting Standing balance support: Bilateral upper extremity supported Standing balance-Leahy Scale: Poor Standing balance comment: UE support and +2 A for balance     Special needs/care consideration BiPAP/CPAP No CPM No Continuous Drip IV No Dialysis No  Life Vest No Oxygen NO Special Bed No Trach Size No Wound Vac (area) No  Skin Abdominal incision, groin incision, wound on buttocks  Bowel mgmt: Last BM 11/28/15 with incontinence Bladder mgmt: Voids with incontinence Diabetic mgmt No    Previous Home Environment Living Arrangements: Alone Available Help at Discharge: Family, Available 24 hours/day Type of Home: House Home Layout: One level Home Access: Ramped entrance Home Care Services: No Additional Comments: lives alone, but plans to stay with his sister who is retired  Discharge Living  Setting Plans for Discharge Living Setting: House, Lives with (comment) (Newly  living with son for 1-2 weeks PTA.) Note: Patient says he is going home with sister, but son has no knowledge of this and sister has not returned my calls. Son reports that patient was in a halfway house and had only been with son for 1-2 weeks. Type of Home at Discharge: House Discharge Home Layout: One level Discharge Home Access: Stairs to enter Entrance Stairs-Number of Steps: 1 step to back porch and 1 step into kitchen Does the patient have any problems obtaining your medications?: No  Social/Family/Support Systems Patient Roles: Parent, Other (Comment) (Has a son, daughter and a sister.) Contact Information: Shawn [name] - son Anticipated Caregiver: Son Anticipated Caregiver's Contact Information: tommy [phone]  Ability/Limitations of Caregiver: Son has muscular dystrophy and can provide supervision but not physical assistance. Daughter in [place]. Sister is in [place]. Caregiver Availability: 24/7 Discharge Plan Discussed with Primary Caregiver: Yes Is Caregiver In Agreement with Plan?: Yes Does Caregiver/Family have Issues with Lodging/Transportation while Pt is in Rehab?: No  Goals/Additional Needs Patient/Family Goal for Rehab: PT/OT supervision and min assist, ST mod I and I goals Expected length of stay: 12-15 days Cultural Considerations: Christian Dietary Needs: Full liquids, thin liquids Equipment Needs: TBD Additional Information: son reports having a difficult time with dad's illness. Son is currently staying with friends so that he can be closer to hospital. Friends are very supportive to son emotionally. Son is aggrevated with patient's current mental confusion. Pt/Family Agrees to Admission and willing to participate: Yes (Son limited due to muscular dystrophy.) Program Orientation Provided & Reviewed with Pt/Caregiver Including Roles & Responsibilities: Yes  Decrease burden of Care through IP rehab admission: N/A  Possible need for SNF placement upon  discharge: Yes, if patient does not progress to functional level where family can manage at home.  Patient Condition: This patient's medical and functional status has changed since the consult dated: 11/27/15 in which the Rehabilitation Physician determined and documented that the patient's condition is appropriate for intensive rehabilitative care in an inpatient rehabilitation facility. See "History of Present Illness" (above) for medical update. Functional changes are: Currently requiring mod assist for stand pivot transfers. Patient's medical and functional status update has been discussed with the Rehabilitation physician and patient remains appropriate for inpatient rehabilitation. Will admit to inpatient rehab today.  Preadmission Screen Completed By: Shawn [name], 11/29/2015 11:10 AM ______________________________________________________________________  Discussed status with Dr. Posey Pronto on 11/29/15 at 1105 and received telephone approval for admission today.  Admission Coordinator: Shawn [name], time1105/Date03/29/17          Cosigned by: Ankit [name], MD at 11/29/2015 12:08 PM  Revision History     Date/Time User Provider Type Action   11/29/2015 12:08 PM Ankit [name], MD Physician Cosign   11/29/2015 11:11 AM Shawn Diones, RN Rehab Admission Coordinator Sign

## 2015-12-01 NOTE — Progress Notes (Signed)
65 y.o. right handed male with history of polysubstance abuse alcohol abuse, bipolar disorder, chronic systolic congestive heart failure. Patient lives alone but plans to stay with his sister who is retired. Patient independent prior to admission. One level home. Presented 11/17/2015 with 3 day history of abdominal pain with nausea and vomiting as well as constipation with fever and chills. Patient noted history of small bowel resection in the past for incarcerated femoral hernia perforation in 2013 and again in 2016. Findings of lactic acid of 12, serum creatinine 6.7. CT of abdomen and pelvis with extensive small bowel pneumatosis, mesenteric and portal venous gas. Underwent exploratory laparotomy 11/17/2015 and found to have viable gut no evidence of ischemic bowel. Wound was left open until 11/21/2015 and underwent closureWith postoperative ventilatory management. Hospital course cardiology services consulted 11/18/2015 for findings of complete heart block/STEMI with heart rate in the 30s developed A. fib with RVR. Echocardiogram with ejection fraction of 45%. Severe inferior lateral hypokinesis to akinesis  Subjective/Complaints: Needs help to eat  ROS-postsurgical abdominal pain, having bowel movements, condom cath for bladder, no chest pain or shortness of breath no nausea or vomiting  Objective: Vital Signs: Blood pressure 112/68, pulse 71, temperature 98.1 F (36.7 C), temperature source Oral, resp. rate 20, height '5\' 11"'$  (1.803 m), weight 77.5 kg (170 lb 13.7 oz), SpO2 100 %. No results found. Results for orders placed or performed during the hospital encounter of 11/29/15 (from the past 72 hour(s))  Glucose, capillary     Status: None   Collection Time: 11/29/15  4:14 PM  Result Value Ref Range   Glucose-Capillary 80 65 - 99 mg/dL   Comment 1 Notify RN   CBC     Status: Abnormal   Collection Time: 11/29/15  4:29 PM  Result Value Ref Range   WBC 7.3 4.0 - 10.5 K/uL   RBC 3.20 (L) 4.22  - 5.81 MIL/uL   Hemoglobin 9.5 (L) 13.0 - 17.0 g/dL   HCT 29.1 (L) 39.0 - 52.0 %   MCV 90.9 78.0 - 100.0 fL   MCH 29.7 26.0 - 34.0 pg   MCHC 32.6 30.0 - 36.0 g/dL   RDW 14.6 11.5 - 15.5 %   Platelets 691 (H) 150 - 400 K/uL  Creatinine, serum     Status: None   Collection Time: 11/29/15  4:29 PM  Result Value Ref Range   Creatinine, Ser 1.08 0.61 - 1.24 mg/dL   GFR calc non Af Amer >60 >60 mL/min   GFR calc Af Amer >60 >60 mL/min    Comment: (NOTE) The eGFR has been calculated using the CKD EPI equation. This calculation has not been validated in all clinical situations. eGFR's persistently <60 mL/min signify possible Chronic Kidney Disease.   Glucose, capillary     Status: Abnormal   Collection Time: 11/29/15 10:41 PM  Result Value Ref Range   Glucose-Capillary 117 (H) 65 - 99 mg/dL  Glucose, capillary     Status: None   Collection Time: 11/30/15  1:07 AM  Result Value Ref Range   Glucose-Capillary 96 65 - 99 mg/dL  CBC WITH DIFFERENTIAL     Status: Abnormal   Collection Time: 11/30/15  4:06 AM  Result Value Ref Range   WBC 7.4 4.0 - 10.5 K/uL   RBC 3.15 (L) 4.22 - 5.81 MIL/uL   Hemoglobin 9.2 (L) 13.0 - 17.0 g/dL   HCT 28.9 (L) 39.0 - 52.0 %   MCV 91.7 78.0 - 100.0 fL   MCH  29.2 26.0 - 34.0 pg   MCHC 31.8 30.0 - 36.0 g/dL   RDW 15.0 11.5 - 15.5 %   Platelets 757 (H) 150 - 400 K/uL   Neutrophils Relative % 79 %   Neutro Abs 5.8 1.7 - 7.7 K/uL   Lymphocytes Relative 14 %   Lymphs Abs 1.0 0.7 - 4.0 K/uL   Monocytes Relative 6 %   Monocytes Absolute 0.4 0.1 - 1.0 K/uL   Eosinophils Relative 2 %   Eosinophils Absolute 0.2 0.0 - 0.7 K/uL   Basophils Relative 0 %   Basophils Absolute 0.0 0.0 - 0.1 K/uL  Comprehensive metabolic panel     Status: Abnormal   Collection Time: 11/30/15  4:06 AM  Result Value Ref Range   Sodium 142 135 - 145 mmol/L   Potassium 4.7 3.5 - 5.1 mmol/L    Comment: DELTA CHECK NOTED SPECIMEN HEMOLYZED. HEMOLYSIS MAY AFFECT INTEGRITY OF  RESULTS.    Chloride 111 101 - 111 mmol/L   CO2 23 22 - 32 mmol/L   Glucose, Bld 88 65 - 99 mg/dL   BUN 10 6 - 20 mg/dL   Creatinine, Ser 1.16 0.61 - 1.24 mg/dL   Calcium 8.1 (L) 8.9 - 10.3 mg/dL   Total Protein 4.6 (L) 6.5 - 8.1 g/dL   Albumin 1.9 (L) 3.5 - 5.0 g/dL   AST 87 (H) 15 - 41 U/L   ALT 69 (H) 17 - 63 U/L   Alkaline Phosphatase 248 (H) 38 - 126 U/L   Total Bilirubin 1.4 (H) 0.3 - 1.2 mg/dL   GFR calc non Af Amer >60 >60 mL/min   GFR calc Af Amer >60 >60 mL/min    Comment: (NOTE) The eGFR has been calculated using the CKD EPI equation. This calculation has not been validated in all clinical situations. eGFR's persistently <60 mL/min signify possible Chronic Kidney Disease.    Anion gap 8 5 - 15  Glucose, capillary     Status: None   Collection Time: 11/30/15  5:37 AM  Result Value Ref Range   Glucose-Capillary 88 65 - 99 mg/dL  Glucose, capillary     Status: None   Collection Time: 11/30/15  7:14 AM  Result Value Ref Range   Glucose-Capillary 97 65 - 99 mg/dL  Magnesium     Status: None   Collection Time: 11/30/15  8:36 AM  Result Value Ref Range   Magnesium 1.9 1.7 - 2.4 mg/dL  Glucose, capillary     Status: Abnormal   Collection Time: 11/30/15 12:03 PM  Result Value Ref Range   Glucose-Capillary 101 (H) 65 - 99 mg/dL   Comment 1 Notify RN   Glucose, capillary     Status: Abnormal   Collection Time: 11/30/15  4:28 PM  Result Value Ref Range   Glucose-Capillary 101 (H) 65 - 99 mg/dL  Glucose, capillary     Status: None   Collection Time: 11/30/15  8:39 PM  Result Value Ref Range   Glucose-Capillary 89 65 - 99 mg/dL   Comment 1 Notify RN   Glucose, capillary     Status: None   Collection Time: 12/01/15  6:27 AM  Result Value Ref Range   Glucose-Capillary 86 65 - 99 mg/dL   Comment 1 Notify RN       General: No acute distress HEENT: Poor dentition, granulation tissue at corner of oral stoma bilaterally Mood and affect are appropriate Heart: Regular  rate and rhythm no rubs murmurs or extra sounds  Lungs: Clear to auscultation, breathing unlabored, no rales or wheezes Abdomen: Positive bowel sounds, soft nontender to palpation, nondistended,midline abdominal incision packed with wet to dry gauze, good granulation tissue, no purulent drainage, no surrounding erythema or maceration. Extremities: No clubbing, cyanosis, or edema Skin: No evidence of breakdown, no evidence of rash Neurologic: Cranial nerves II through XII intact, motor strength is 4/5 in bilateral deltoid, bicep, tricep, grip, hip flexor, knee extensors, ankle dorsiflexor and plantar flexor Sensory exam normal sensation to light touch and proprioception in bilateral upper and lower extremities Cerebellar exam normal finger to nose to finger as well as heel to shin in bilateral upper and lower extremities Musculoskeletal: Full range of motion in all 4 extremities. No joint swelling   Assessment/Plan: 1. 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 Physiatrist is providing close team supervision and 24 hour management of active medical problems listed below. Physiatrist and rehab team continue to assess barriers to discharge/monitor patient progress toward functional and medical goals. FIM: Function - Bathing Position: Bed (bed level for perineal, w/c at sink for UB) Body parts bathed by patient: Right arm, Left arm, Chest Body parts bathed by helper: Front perineal area, Buttocks Bathing not applicable: Right upper leg, Left upper leg, Right lower leg, Left lower leg, Abdomen (only completed UB bathing, incontinent of BM RN assisted with hygiene at bed level) Assist Level:  (Mod assist)  Function- Upper Body Dressing/Undressing What is the patient wearing?: Hospital gown Assist Level: Set  up Function - Lower Body Dressing/Undressing Assist for lower body dressing:  (only wearing incontinence brief)  Function - Toileting Toileting activity did not occur: No continent bowel/bladder event Toileting steps completed by helper: Adjust clothing prior to toileting, Performs perineal hygiene, Adjust clothing after toileting Assist level: Touching or steadying assistance (Pt.75%)  Function - Toilet Transfers Toilet transfer assistive device: Bedside commode Assist level to bedside commode (at bedside): Touching or steadying assistance (Pt > 75%) Assist level from bedside commode (at bedside): Touching or steadying assistance (Pt > 75%)  Function - Chair/bed transfer Chair/bed transfer method: Squat pivot Chair/bed transfer assist level: Touching or steadying assistance (Pt > 75%) Chair/bed transfer assistive device: Armrests Chair/bed transfer details: Verbal cues for precautions/safety, Verbal cues for sequencing, Verbal cues for technique  Function - Locomotion: Wheelchair Will patient use wheelchair at discharge?: No Type: Manual Max wheelchair distance: 150 Assist Level: Supervision or verbal cues Assist Level: Supervision or verbal cues Assist Level: Supervision or verbal cues Turns around,maneuvers to table,bed, and toilet,negotiates 3% grade,maneuvers on rugs and over doorsills: No Function - Locomotion: Ambulation Assistive device: Walker-rolling Max distance: 25 Assist level: Touching or steadying assistance (Pt > 75%) Assist level: Touching or steadying assistance (Pt > 75%) Walk 50 feet with 2 turns activity did not occur: Safety/medical concerns Walk 150 feet activity did not occur: Safety/medical concerns Walk 10 feet on uneven surfaces activity did not occur: Safety/medical concerns  Function - Comprehension Comprehension: Auditory Comprehension assist level: Understands basic 75 - 89% of the time/ requires cueing 10 - 24% of the time  Function -  Expression Expression: Verbal Expression assist level: Expresses basic 75 - 89% of the time/requires cueing 10 - 24% of the time. Needs helper to occlude trach/needs to repeat words.  Function - Social Interaction Social Interaction assist level: Interacts appropriately 75 - 89% of the time - Needs redirection for appropriate  language or to initiate interaction.  Function - Problem Solving Problem solving assist level: Solves basic 25 - 49% of the time - needs direction more than half the time to initiate, plan or complete simple activities  Function - Memory Memory assist level: Recognizes or recalls 50 - 74% of the time/requires cueing 25 - 49% of the time  Medical Problem List and Plan: 1.  Debilitation secondary to respiratory failure/small bowel repair/septic shock Burman [employer] pneumonia /multiple medical-Cont rehabilitation program 2.  DVT Prophylaxis/Anticoagulation: Subcutaneous heparin. Monitor for any bleeding episode 3. Pain Management: Oxycodone as needed 4. Acute blood loss anemia. Follow-up CBC, Hgb 9.2 5. Neuropsych: This patient is capable of making decisions on his own behalf. 6. Skin/Wound Care: Routine skin checks 7. Fluids/Electrolytes/Nutrition: Routine I&O with follow-up chemistries 8. A. fib with RVR/complete heart block. Continue amiodarone.Cardiac rate controlled. 9. Acute on chronic systolic congestive heart failure. Monitor for any signs of fluid overload. Continue Lasix 40 mg daily as directed, no signs of peripheral edema 10. Adrenal insufficiency. Weaning Solu-Cortef 11. Mood/bipolar disorder.  Lamictal 200 mg daily at bedtime as well as Seroquel 50 mg daily at bedtime and titrate up the home dose of 300 mg as needed. 12. Decreased nutritional storage. Advance diet as tolerated. 13. Alcohol polysubstance abuse. Counseling. Monitor for any signs of withdrawal, neuropsych evaluation 14. Hypokalemia. Continue potassium supplement. Follow-up chemistries, K 4.7 15.  Oral ulcer, both on mucosal and oral commissure,angular chelitis, will start antifungal cream 16. Hypoalbuminemia Protostat, dietary consult LOS (Days) 2 A FACE TO FACE EVALUATION WAS PERFORMED  Gerell Fortson E 12/01/2015, 8:25 AM

## 2015-12-01 NOTE — Progress Notes (Signed)
Physical Therapy Session Note  Patient Details  Name: Shawn [name] MRN: [id number] Date of Birth: 10/22/1950  Today's Date: 12/01/2015 PT Individual Time: 1000-1055 and 1305-1420  PT Individual Time Calculation (min): 55 min and 75 min (total 130 min)  Short Term Goals: Week 1:  PT Short Term Goal 1 (Week 1): Pt will perform stand pivot transfer with S PT Short Term Goal 2 (Week 1): Pt will perform bed mobility with S PT Short Term Goal 3 (Week 1): Pt will perform gait with LRAD x100' with CGA and LRAD PT Short Term Goal 4 (Week 1): Pt will initiate stair training  Skilled Therapeutic Interventions/Progress Updates:    Tx 1: Pt received supine in bed, denies pain and agreeable to treatment. Supine>sit with S using bedrails. Stand pivot transfer no AD with minA and verbal cues to complete turn prior to sitting to avoid sitting on wheel. Pt with incontinent bowel movement but does not alert therapist, and when asked if needs to use the restroom and change pt responds no. Discussed timed toileting with pt and RN as it would be beneficial to get pt back in routine of using toilet and becoming more aware of urgency. Sit <>stand x3 reps at sink with standing tolerance of approximately 2 min at a time while therapist performed hygiene totalA after incontinent bowel movement. Seated rest breaks required due to fatigue. W/c propulsion to gym with BUE and S x150'. Gait with RW x30' with min guard. Upon sitting pt c/o dizziness not resolving with rest. BP assessed 112/54 HR 70 O2 98%. NT assessed blood sugar WNL. Pt given drink and attempted to do sitting activity putting together puzzle however pt continues to feel nauseous. Pt given basin, returned to room and remained seated in w/c with all needs in reach. Alerted NT to pt request to return to bed once bed is made.   Tx 2: Pt received supine in bed, denies pain and agreeable to treatment. Supine>sit with S using bedrails. Stand pivot transfer minA to  w/c. Stand pivot w/c <>toilet with grab bars and minA. TotalA for clothing management, performs hygiene with S. Sit <>stand x3 reps to complete donning brief due to fatigue and need for rest breaks; BUE support on grab bar for balance. Seated in w/c at sink, performs upper body dressing with minA and cueing for finishing task, as pt threads RUE then appears to give up on LUE stating "I can't do it". Completed with min cueing and encouragement to perform without assist. Similarly with pants, pt attempts one time and is unable to get LLE into pants; immediately hands pants to therapist and says "you do it". Again encouraged pt to complete without assist, ultimately provided minA for threading RLE. W/c propulsion x150' with BUE and S for strengthening, aerobic endurance. Nustep with BUE/BLE on level 3 with average 40 steps/min; 5 min total active movement with rest breaks after approximately every 1 minute due to fatigue. W/c propulsion to return to room, several rest breaks and mod cues encouraging pt to continue. In room, performed LE strengthening exercises in sitting/supine, and pt given handouts with pictures and written directions for exercises. Remained supine in bed with alarm intact and all needs in reach at completion of session.   Therapy Documentation Precautions:  Precautions Precautions: Fall Restrictions Weight Bearing Restrictions: No Pain: Pain Assessment Pain Assessment: No/denies pain Pain Score: 0-No pain   See Function Navigator for Current Functional Status.   Therapy/Group: Individual Therapy  Luberta Mutter  12/01/2015, 2:23 PM

## 2015-12-01 NOTE — Progress Notes (Signed)
Ankit [name], MD Physician Signed Physical Medicine and Rehabilitation Consult Note 11/27/2015 2:13 PM  Related encounter: ED to Hosp-Admission (Discharged) from 11/17/2015 in [place] Collapse All        Physical Medicine and Rehabilitation Consult Reason for Consult: Debilitation/multi-medical Referring Physician: Critical care   HPI: Shawn [name] is a 65 y.o. right handed male with history of polysubstance abuse alcohol abuse, bipolar disorder. Patient lives alone but plans to stay with his sister who is retired. Patient independent prior to admission. One level home. Presented 11/17/2015 with 3 day history of abdominal pain with nausea and vomiting as well as constipation with fever and chills. Patient noted history of small bowel resection in the past for incarcerated femoral hernia perforation in 2013 and again in 2016. Findings of lactic acid of 12, serum creatinine 6.7. CT of abdomen and pelvis with extensive small bowel pneumatosis, mesenteric and portal venous gas. Underwent exploratory laparotomy 11/17/2015 and found to have viable gut no evidence of ischemic bowel. Wound was left open until 11/21/2015 and underwent closure postoperative ventilatory management. Hospital course cardiology services consulted 11/18/2015 for findings of complete heart block/STEMI with heart rate in the 30s developed A. fib with RVR. Echocardiogram with ejection fraction of 45%. Severe inferior lateral hypokinesis to akinesis. Patient did receive a temporary pacemaker for short time. Placed on intravenous amiodarone. Attempted cardioversion failed. Patient extubated 11/26/2015. Creatinine has improved to 1.04. Subcutaneous heparin later added for DVT prophylaxis. Currently on a clear liquid diet and advanced as tolerated.   Review of Systems  Constitutional: Positive for fever and chills.  HENT: Negative for hearing loss.  Eyes: Negative for blurred  vision and double vision.  Respiratory: Positive for shortness of breath. Negative for cough.  Cardiovascular: Positive for palpitations and leg swelling. Negative for chest pain.  Gastrointestinal: Positive for nausea, vomiting, abdominal pain and constipation.  Genitourinary: Negative for dysuria and hematuria.  Musculoskeletal: Positive for myalgias and joint pain.  Skin: Negative for rash.  Neurological: Positive for weakness. Negative for seizures, loss of consciousness and headaches.  All other systems reviewed and are negative.  Past Medical History  Diagnosis Date  . Osteoarthritis X years    left knee. Distant hx (age 89) "dislocated" knee and required surgery, then crush injury to patella required knee cap removal.  . Diverticular disease     "itis" x 2 episodes (last was about 2007)  . Tobacco dependence   . Multiple rib fractures 10/09/10    s/p fall down a ravine--9th and 10th on right, contusion of left 7th and 8th ribs  . Fracture of metatarsal bone(s), closed     3rd, 4th, 5th mid shaft on left foot  . History of substance abuse     cocaine, marijuana, acid, alcohol (in remission since 2010)  . Solar dermatitis     face and ears  . Bipolar disorder [facility])     Has had multiple psych hospitalization in the past, most recently at [place] [facility] 01/31/11-02/04/11. Says meds made him emotionally blunted. (lamictal and wellbutrin).  . Alcoholism ([place])     "Dry" since 2002; relapse 2014  . Depression   . Anxiety   . Vitamin B12 deficiency 10/2013    IF ab positive. Replacement therapy started end of March 2015   Past Surgical History  Procedure Laterality Date  . Knee dislocation surgery  1966  . Patellectomy  1968    s/p crush  injury  . Inguinal hernia repair      left  . Hernia repair      at 65 years of age 8  . Total knee arthroplasty  11/20/2011     Procedure: TOTAL KNEE ARTHROPLASTY; Surgeon: Ninetta Lights, MD; Location: [place]; Service: Orthopedics; Laterality: Left; DR Percell Miller WANTS 90MINUTES FOR THIS CASE  . Inguinal hernia repair  06/15/2012    Procedure: HERNIA REPAIR INGUINAL INCARCERATED; Surgeon: Edward Jolly, MD; Location: [facility]; Service: General; Laterality: Right;  . Bowel resection  06/15/2012    Procedure: SMALL BOWEL RESECTION; Surgeon: Edward Jolly, MD; Location: [facility]; Service: General; Laterality: N/A;  . Femur im nail  08/06/2012    Procedure: INTRAMEDULLARY (IM) NAIL FEMORAL; Surgeon: Johnn Hai, MD; Location: [facility]; Service: Orthopedics; Laterality: Left;  . Laparotomy N/A 11/17/2015    Procedure: EXPLORATORY LAPAROTOMY; Surgeon: Donnie Mesa, MD; Location: [place]; Service: General; Laterality: N/A;  . Laparotomy N/A 11/19/2015    Procedure: EXPLORATORY LAPAROTOMY OPEN ABDOMEN ABDOMINAL WOUND VAC CHANGE; Surgeon: Ralene Ok, MD; Location: [place]; Service: General; Laterality: N/A;  . Bowel resection  11/19/2015    Procedure: SMALL BOWEL RESECTION; Surgeon: Ralene Ok, MD; Location: [place]; Service: General;;  . Cardiac catheterization N/A 11/20/2015    Procedure: Temporary Wire; Surgeon: Evans Lance, MD; Location: [place] [facility]; Service: Cardiovascular; Laterality: N/A;  . Small bowel repair N/A 11/21/2015    Procedure: SMALL BOWEL REPAIR, SMALL BOWEL ANATAMOSIS AND ABDOMINAL CLOSURE; Surgeon: Coralie Keens, MD; Location: [facility]; Service: General; Laterality: N/A;   Family History  Problem Relation Age of Onset  . Arthritis Mother   . Arthritis Father   . Cancer Brother     lung cancer. Died in early 51s.   Social History:  reports that he has been smoking Cigarettes. He has a 30 pack-year smoking history. He has never used smokeless tobacco. He reports that he drinks  alcohol. He reports that he does not use illicit drugs. Allergies:  Allergies  Allergen Reactions  . Codeine Nausea Only and Other (See Comments)    flushing  . Depakote [Divalproex Sodium] Other (See Comments)    Makes me crazy   Medications Prior to Admission  Medication Sig Dispense Refill  . diclofenac (VOLTAREN) 75 MG EC tablet Take 75 mg by mouth 2 (two) times daily.    Marland Kitchen dicyclomine (BENTYL) 10 MG capsule Take 10 mg by mouth 4 (four) times daily.    Marland Kitchen escitalopram (LEXAPRO) 10 MG tablet Take 10 mg by mouth at bedtime.    . folic acid (FOLVITE) 1 MG tablet Take 1 mg by mouth daily.    Marland Kitchen lamoTRIgine (LAMICTAL) 200 MG tablet Take 200 mg by mouth at bedtime.    . Multiple Vitamin (MULTIVITAMIN WITH MINERALS) TABS tablet Take 1 tablet by mouth daily.    . QUEtiapine (SEROQUEL) 300 MG tablet Take 300 mg by mouth at bedtime.    . traMADol-acetaminophen (ULTRACET) 37.5-325 MG tablet Take 1 tablet by mouth every 4 (four) hours as needed (pain).    . hydroxypropyl methylcellulose (ISOPTO TEARS) 2.5 % ophthalmic solution Place 2 drops into both eyes as needed for dry eyes.      Home: Home Living Family/patient expects to be discharged to:: Inpatient rehab Living Arrangements: Other relatives Available Help at Discharge: Family, Available 24 hours/day Type of Home: House Home Access: Ramped entrance [place]: One [place]: None Additional Comments: lives alone, but plans to stay with  his sister who is retired  Actuary History: Prior Function Level of Independence: Independent Functional Status:  Mobility: Bed Mobility General bed mobility comments: just up to chair with nursing Transfers Overall transfer level: Needs assistance Equipment used: Rolling walker (2 wheeled) Transfers: Sit to/from Stand Sit to Stand: +2 physical assistance, Mod assist General transfer comment: stood first attempt  wtih +1 assist and difficulty getting upright due to anxiety, L LE weakness and poor tolerance with HR up to 123. Second attempt up with nursing assist and mod +2 A with cues for hand placement esp on L when pt reaching for bed with L UE due to L LE weakness Ambulation/Gait General Gait Details: unable    ADL:    Cognition: Cognition Overall Cognitive Status: No family/caregiver present to determine baseline cognitive functioning Orientation Level: Oriented to person Cognition Arousal/Alertness: Awake/alert Behavior During Therapy: Anxious Overall Cognitive Status: No family/caregiver present to determine baseline cognitive functioning  Blood pressure 130/70, pulse 75, temperature 97.7 F (36.5 C), temperature source Oral, resp. rate 21, height 5\' 10"  (1.778 m), weight 78.4 kg (172 lb 13.5 oz), SpO2 99 %. Physical Exam  Vitals reviewed. Constitutional: He is oriented to person, place, and time. No distress.  Frail  HENT:  Head: Normocephalic.  Mouth/Throat: Oropharynx is clear and moist.  Eyes: Conjunctivae and EOM are normal.  Neck: Neck supple. No thyromegaly present.  Cardiovascular:  Cardiac rate controlled  Respiratory: Effort normal.  Decreased breath sounds at the bases  GI: Soft. Bowel sounds are normal.  Wet-to-dry dressing to abdominal wound  Musculoskeletal: He exhibits no edema or tenderness.  Neurological: He is alert and oriented to person, place, and time.  Makes good eye contact with examiner.  Follows simple commands. Sensation intact light touch Motor: Bilateral upper extremities proximally 4+/5, distally 5/5 Bilateral lower extremities: Proximally 4/5, distally 5/5  Skin: Skin is warm and dry. He is not diaphoretic.  Dressing to neck and abdomen c/d/i  Psychiatric: His behavior is normal. Thought content normal.     Lab Results Last 24 Hours    Results for orders placed or performed during the hospital encounter of 11/17/15 (from the past 24  hour(s))  Glucose, capillary Status: Abnormal   Collection Time: 11/26/15 3:41 PM  Result Value Ref Range   Glucose-Capillary 101 (H) 65 - 99 mg/dL   Comment 1 Capillary Specimen    Comment 2 Notify RN   Glucose, capillary Status: None   Collection Time: 11/26/15 7:45 PM  Result Value Ref Range   Glucose-Capillary 98 65 - 99 mg/dL   Comment 1 Notify RN    Comment 2 Document in Chart   Glucose, capillary Status: Abnormal   Collection Time: 11/27/15 12:19 AM  Result Value Ref Range   Glucose-Capillary 111 (H) 65 - 99 mg/dL   Comment 1 Notify RN    Comment 2 Document in Chart   Basic metabolic panel Status: Abnormal   Collection Time: 11/27/15 3:41 AM  Result Value Ref Range   Sodium 143 135 - 145 mmol/L   Potassium 3.2 (L) 3.5 - 5.1 mmol/L   Chloride 103 101 - 111 mmol/L   CO2 30 22 - 32 mmol/L   Glucose, Bld 116 (H) 65 - 99 mg/dL   BUN 15 6 - 20 mg/dL   Creatinine, Ser 1.04 0.61 - 1.24 mg/dL   Calcium 7.8 (L) 8.9 - 10.3 mg/dL   GFR calc non Af Amer >60 >60 mL/min   GFR calc Af Amer >60 >60  mL/min   Anion gap 10 5 - 15  CBC with Differential/Platelet Status: Abnormal   Collection Time: 11/27/15 3:41 AM  Result Value Ref Range   WBC 13.4 (H) 4.0 - 10.5 K/uL   RBC 2.92 (L) 4.22 - 5.81 MIL/uL   Hemoglobin 8.5 (L) 13.0 - 17.0 g/dL   HCT 26.2 (L) 39.0 - 52.0 %   MCV 89.7 78.0 - 100.0 fL   MCH 29.1 26.0 - 34.0 pg   MCHC 32.4 30.0 - 36.0 g/dL   RDW 14.3 11.5 - 15.5 %   Platelets 457 (H) 150 - 400 K/uL   Neutrophils Relative % 95 %   Neutro Abs 12.8 (H) 1.7 - 7.7 K/uL   Lymphocytes Relative 3 %   Lymphs Abs 0.4 (L) 0.7 - 4.0 K/uL   Monocytes Relative 2 %   Monocytes Absolute 0.2 0.1 - 1.0 K/uL   Eosinophils Relative 0 %   Eosinophils Absolute 0.0 0.0 - 0.7 K/uL   Basophils  Relative 0 %   Basophils Absolute 0.0 0.0 - 0.1 K/uL  Phosphorus Status: None   Collection Time: 11/27/15 3:41 AM  Result Value Ref Range   Phosphorus 3.0 2.5 - 4.6 mg/dL  Magnesium Status: None   Collection Time: 11/27/15 3:41 AM  Result Value Ref Range   Magnesium 1.7 1.7 - 2.4 mg/dL  Glucose, capillary Status: Abnormal   Collection Time: 11/27/15 3:45 AM  Result Value Ref Range   Glucose-Capillary 110 (H) 65 - 99 mg/dL   Comment 1 Notify RN    Comment 2 Document in Chart   Glucose, capillary Status: None   Collection Time: 11/27/15 8:39 AM  Result Value Ref Range   Glucose-Capillary 86 65 - 99 mg/dL   Comment 1 Notify RN   Glucose, capillary Status: None   Collection Time: 11/27/15 12:16 PM  Result Value Ref Range   Glucose-Capillary 95 65 - 99 mg/dL   Comment 1 Notify RN       Imaging Results (Last 48 hours)    Dg Chest Port 1 View  11/27/2015 CLINICAL DATA: Pulmonary edema EXAM: PORTABLE CHEST 1 VIEW COMPARISON: November 26, 2015 FINDINGS: Endotracheal tube and nasogastric tube have been removed. Central catheter tip is in the superior vena cava. No pneumothorax. There is persistent consolidation in the left base with small left effusion. There has been interval clearing of airspace opacity from the right base. Currently there is a slight degree of underlying interstitial pulmonary edema. Heart is upper normal in size with pulmonary vascularity within normal limits. IMPRESSION: Trace interstitial edema. Airspace consolidation left base with small left effusion. Suspect a degree of pneumonia in the left base region. Airspace opacity from the right base has cleared. No change in cardiac silhouette. Electronically Signed By: Lowella Grip III M.D. On: 11/27/2015 07:37   Dg Chest Port 1 View  11/26/2015 CLINICAL DATA: Pulmonary edema EXAM: PORTABLE CHEST 1 VIEW COMPARISON:  11/24/2015 FINDINGS: Cardiomediastinal silhouette is stable. Endotracheal tube in place with tip 3.9 cm above the carina. Stable right IJ central line with tip in SVC. Stable NG tube position. No pulmonary edema. Persistent hazy bilateral basilar atelectasis or infiltrate right greater than left. Slight improvement in aeration in upper lungs. IMPRESSION: Stable support apparatus. Slight improvement in aeration in upper lungs. Persistent bilateral basilar hazy atelectasis or infiltrate right greater than left. No pulmonary edema. Electronically Signed By: Lahoma Crocker M.D. On: 11/26/2015 09:48     Assessment/Plan: Diagnosis: Debilitation/multi-medical Labs and images independently reviewed. Records reviewed  and summated above.  1. Does the need for close, 24 hr/day medical supervision in concert with the patient's rehab needs make it unreasonable for this patient to be served in a less intensive setting? Yes  2. Co-Morbidities requiring supervision/potential complications: polysubstance abuse (cont to counsel), bipolar disorder (cont meds, ensure mood does not limit functional progress), Hx of ventilatory (cont to monitor O2 sats and RR with increased physical activity), heart block/STEMI (cont meds, monitor HR with physical exertion), A. fib with RVR (Cont meds), systolic CHF (Monitor in accordance with increased physical activity and avoid UE resistance excercises), AKI (avoid nephrotoxic meds), dysphagia (the SLP, consider vital stem), tachypnea (monitor RR and O2 Sats with increased physical exertion), Tachycardia (monitor in accordance with pain and increasing activity), hypokalemia (continue to monitor and replete as necessary), leukocytosis (cont to monitor for signs and symptoms of infection, further workup if indicated), ABLA (transfuse if necessary to ensure appropriate perfusion for increased activity tolerance) 3. Due to bladder management, safety, skin/wound care, disease management,  medication administration and patient education, does the patient require 24 hr/day rehab nursing? Yes 4. Does the patient require coordinated care of a physician, rehab nurse, PT (1-2 hrs/day, 5 days/week), OT (1-2 hrs/day, 5 days/week) and SLP (1-2 hrs/day, 5  days/week) to address physical and functional deficits in the context of the above medical diagnosis(es)? Yes Addressing deficits in the following areas: balance, endurance, locomotion, strength, transferring, bathing, dressing, feeding, toileting, speech, swallowing and psychosocial support 5. Can the patient actively participate in an intensive therapy program of at least 3 hrs of therapy per day at least 5 days per week? Potentially 6. The potential for patient to make measurable gains while on inpatient rehab is excellent 7. Anticipated functional outcomes upon discharge from inpatient rehab are supervision and min assist with PT, supervision and min assist with OT, independent and modified independent with SLP. 8. Estimated rehab length of stay to reach the above functional goals is: 12-15 days. 9. Does the patient have adequate social supports and living environment to accommodate these discharge functional goals? Yes 10. Anticipated D/C setting: Home 11. Anticipated post D/C treatments: HH therapy and Home excercise program 12. Overall Rehab/Functional Prognosis: good  RECOMMENDATIONS: This patient's condition is appropriate for continued rehabilitative care in the following setting: Recommend CIR once medically stable. Patient has agreed to participate in recommended program. Yes Note that insurance prior authorization may be required for reimbursement for recommended care.  Comment: Rehab Admissions Coordinator to follow up.  Delice Lesch, MD 11/27/2015       Revision History     Date/Time User Provider Type Action   11/27/2015 3:50 PM Ankit [name], MD Physician Sign   11/27/2015 2:37 PM Cathlyn Parsons, PA-C  Physician Assistant Pend   View Details Report       Routing History     Date/Time From To Method   11/27/2015 3:50 PM Ankit [name], MD Tammi Sou, MD In [facility]

## 2015-12-01 NOTE — Progress Notes (Signed)
Occupational Therapy Session Note  Patient Details  Name: Shawn [name] MRN: [id number] Date of Birth: 05-18-1951  Today's Date: 12/01/2015 OT Individual Time: 1100-1200 OT Individual Time Calculation (min): 60 min    Short Term Goals: Week 1:  OT Short Term Goal 1 (Week 1): Pt will complete bathing at sit > stand level with min assist OT Short Term Goal 2 (Week 1): Pt will complete UB bathing with min assist OT Short Term Goal 3 (Week 1): Pt will complete LB dressing with min assist at sit > stand level OT Short Term Goal 4 (Week 1): Pt will complete toilet transfers with min assist with LRAD OT Short Term Goal 5 (Week 1): Pt will complete 1 grooming task in standing with min assist  Skilled Therapeutic Interventions/Progress Updates:    Pt seen for skilled OT to facilitate functional mobility and activity tolerance. Pt received in bed being cleansed by nurse tech due to a bowel accident.  Pt stating he was very nauseaus, RN provided medication. Pt sat to EOB but could not tolerate sitting due to weakness and layed back down. Pt repeated this 5x trying to work up his tolerance to sit in chair at sink for grooming and bathing. This went on for half an hour where he would sit up for a minute or so, stand to RW and then lay back down stating he "couldnt take it today".  He acknowledged that he walked in PT session earlier.  He did stand long enough to use the urinal.  Pt was agreeable to working on UE exercises in bed with AROM and with a weighted dowel bar. Pt able to complete 12 reps of 10 different exercises for shoulders, triceps, chest.  Pt resting in bed with all needs met.    Therapy Documentation Precautions:  Precautions Precautions: Fall Restrictions Weight Bearing Restrictions: No   Pain: Pain Assessment Pain Assessment: No/denies pain Pain Score: 0-No pain ADL:  See Function Navigator for Current Functional Status.   Therapy/Group: Individual  Therapy  [place] 12/01/2015, 12:15 PM

## 2015-12-02 ENCOUNTER — Inpatient Hospital Stay (HOSPITAL_COMMUNITY): Payer: Medicare Other

## 2015-12-02 ENCOUNTER — Inpatient Hospital Stay (HOSPITAL_COMMUNITY): Payer: Self-pay | Admitting: Occupational Therapy

## 2015-12-02 ENCOUNTER — Inpatient Hospital Stay (HOSPITAL_COMMUNITY): Payer: Self-pay | Admitting: Physical Therapy

## 2015-12-02 LAB — GLUCOSE, CAPILLARY
GLUCOSE-CAPILLARY: 123 mg/dL — AB (ref 65–99)
GLUCOSE-CAPILLARY: 108 mg/dL — AB (ref 65–99)
GLUCOSE-CAPILLARY: 103 mg/dL — AB (ref 65–99)
Glucose-Capillary: 115 mg/dL — ABNORMAL HIGH (ref 65–99)
Glucose-Capillary: 99 mg/dL (ref 65–99)

## 2015-12-02 MED ORDER — SALINE SPRAY 0.65 % NA SOLN
1.0000 | NASAL | Status: DC | PRN
  Administered 2015-12-02 – 2015-12-09 (×3): 1 via NASAL
  Filled 2015-12-02: qty 44

## 2015-12-02 NOTE — IPOC Note (Signed)
Overall Plan of Care [facility]) Patient Details Name: Shawn [name] MRN: [id number] DOB: 08-14-1951  Admitting Diagnosis: [facility] Problems: Active Problems:   Debility   Paroxysmal atrial fibrillation (HCC)   Acute on chronic systolic heart failure ([place])   Adrenal insufficiency (HCC)   Alcohol abuse   Debilitated     Functional Problem List: Nursing Behavior, Bladder, Bowel, Endurance, Medication Management, Nutrition, Pain, Safety, Skin Integrity  PT Balance, Safety, Behavior, Endurance, Motor, Pain  OT Balance, Endurance, Pain, Safety, Skin Integrity  SLP    TR         Basic ADL's: OT Grooming, Bathing, Dressing, Toileting     Advanced  ADL's: OT Simple Meal Preparation     Transfers: PT Bed to Chair, Bed Mobility, Car, Manufacturing systems engineer, Metallurgist: PT Ambulation, Emergency planning/management officer, Stairs     Additional Impairments: OT None  SLP        TR      Anticipated Outcomes Item Anticipated Outcome  Self Feeding Mod I  Swallowing      Basic self-care  Supervision/setup  Insurance underwriter Transfers Supervision  Bowel/Bladder  Min A  Transfers  mod I  Locomotion  S ambulation home/community and stairs  Communication     Cognition     Pain  <4 on 0-10 scale  Safety/Judgment  mod assist   Therapy Plan: PT Intensity: Minimum of 1-2 x/day ,45 to 90 minutes PT Frequency: 5 out of 7 days PT Duration Estimated Length of Stay: 12-14 days OT Intensity: Minimum of 1-2 x/day, 45 to 90 minutes OT Frequency: 5 out of 7 days OT Duration/Estimated Length of Stay: 12-14 days         Team Interventions: Nursing Interventions Patient/Family Education, Bladder Management, Bowel Management, Disease Management/Prevention, Pain Management, Medication Management, Skin Care/Wound Management, Discharge Planning, Psychosocial Support  PT interventions Ambulation/gait training, Training and development officer, Community  reintegration, Engineer, drilling, Functional mobility training, Neuromuscular re-education, Patient/family education, Pain management, Psychosocial support, Stair training, Therapeutic Activities, Therapeutic Exercise, UE/LE Coordination activities, UE/LE Strength taining/ROM, Wheelchair propulsion/positioning  OT Interventions Training and development officer, Discharge planning, Disease mangement/prevention, DME/adaptive equipment instruction, Functional mobility training, Pain management, Patient/family education, Psychosocial support, Self Care/advanced ADL retraining, Skin care/wound managment, Therapeutic Activities, Therapeutic Exercise, UE/LE Strength taining/ROM  SLP Interventions    TR Interventions    SW/CM Interventions Discharge Planning, Psychosocial Support, Patient/Family Education    Team Discharge Planning: Destination: PT-Home ,OT- Home , SLP-  Projected Follow-up: PT-Home health PT, OT-  Home health OT, 24 hour supervision/assistance, SLP-None Projected Equipment Needs: PT-To be determined, OT- To be determined, SLP-None recommended by SLP Equipment Details: PT- , OT-  Patient/family involved in discharge planning: PT- Patient,  OT-Patient, SLP-Patient  MD ELOS: 12-15d Medical Rehab Prognosis:  Good Assessment: 65 y.o. right handed male with history of polysubstance abuse alcohol abuse, bipolar disorder, chronic systolic congestive heart failure. Patient lives alone but plans to stay with his sister who is retired. Patient independent prior to admission. One level home. Presented 11/17/2015 with 3 day history of abdominal pain with nausea and vomiting as well as constipation with fever and chills. Patient noted history of small bowel resection in the past for incarcerated femoral hernia perforation in 2013 and again in 2016. Findings of lactic acid of 12, serum creatinine 6.7. CT of abdomen and pelvis with extensive small bowel pneumatosis, mesenteric and portal venous  gas. Underwent exploratory laparotomy 11/17/2015 and found to have viable  gut no evidence of ischemic bowel. Wound was left open until 11/21/2015 and underwent closureWith postoperative ventilatory management. Hospital course cardiology services consulted 11/18/2015 for findings of complete heart block/STEMI with heart rate in the 30s developed A. fib with RVR. Echocardiogram with ejection fraction of 45%. Severe inferior lateral hypokinesis to akinesis    Now requiring 24/7 Rehab RN,MD, as well as CIR level PT, OT and SLP.  Treatment team will focus on ADLs and mobility with goals set at [facility] I/Sup See Team Conference Notes for weekly updates to the plan of care

## 2015-12-02 NOTE — Progress Notes (Signed)
Occupational Therapy Session Note  Patient Details  Name: Shawn [name] MRN: [id number] Date of Birth: Oct 29, 1950  Today's Date: 12/02/2015 OT Individual Time: 1000-1100 and 1300-1345 OT Individual Time Calculation (min): 60 min and 45 min    Short Term Goals: Week 1:  OT Short Term Goal 1 (Week 1): Pt will complete bathing at sit > stand level with min assist OT Short Term Goal 2 (Week 1): Pt will complete UB bathing with min assist OT Short Term Goal 3 (Week 1): Pt will complete LB dressing with min assist at sit > stand level OT Short Term Goal 4 (Week 1): Pt will complete toilet transfers with min assist with LRAD OT Short Term Goal 5 (Week 1): Pt will complete 1 grooming task in standing with min assist  Skilled Therapeutic Interventions/Progress Updates:    Session 1: OT session focused on activity tolerance, functional transfers, ADL retraining, and standing balance. Pt received supine in bed agreeable for bathing with no avoidance. Pt completed stand pivot transfer bed>w/c with min A and cues for safety. Pt completed bathing and dressing at sink with mod A and mod cues for sequencing and attention. Pt stood for apprx 1 minute 3x, requiring long rest breaks following each stand. Pt completed toilet transfer with min A today as he had continent episode. At end of session, pt returned to bed and left with all needs in reach.  Session 2: OT session focused on activity tolerance, functional transfers, and standing balance. Pt received supine in bed requiring min cues of encouragement for therapy due to fatigue. Pt completed supine>sit with supervision then required long rest break before transferring to w/c. Pt completed 4 stand pivot transfers with min A and use of RW. OT provided min verbal and tactile cues to facilitate upright posture during transfers. Pt completed sit<>stand 2x with supervision then remained in standing for 15 seconds. At end of session pt returned to bed and left with  all needs in reach.  Therapy Documentation Precautions:  Precautions Precautions: Fall Restrictions Weight Bearing Restrictions: No General:   Vital Signs:   Pain: Pain Assessment Pain Assessment: 0-10 Pain Score: 8  Pain Type: Surgical pain Pain Location: Abdomen Pain Orientation: Mid Pain Descriptors / Indicators: Aching Pain Frequency: Constant Pain Onset: Gradual Patients Stated Pain Goal: 3 Pain Intervention(s): Medication (See eMAR) ADL:   Exercises:   Other Treatments:    See Function Navigator for Current Functional Status.   Therapy/Group: Individual Therapy  Duayne Cal 12/02/2015, 12:09 PM

## 2015-12-02 NOTE — Progress Notes (Signed)
Subjective/Complaints: Feels better today but not as good as he did when he was 23  ROS-postsurgical abdominal pain, having bowel movements, condom cath for bladder, no chest pain or shortness of breath no nausea or vomiting  Objective: Vital Signs: Blood pressure 126/61, pulse 66, temperature 98.7 F (37.1 C), temperature source Oral, resp. rate 20, height '5\' 11"'$  (1.803 m), weight 77.2 kg (170 lb 3.1 oz), SpO2 100 %. No results found. Results for orders placed or performed during the hospital encounter of 11/29/15 (from the past 72 hour(s))  Glucose, capillary     Status: None   Collection Time: 11/29/15  4:14 PM  Result Value Ref Range   Glucose-Capillary 80 65 - 99 mg/dL   Comment 1 Notify RN   CBC     Status: Abnormal   Collection Time: 11/29/15  4:29 PM  Result Value Ref Range   WBC 7.3 4.0 - 10.5 K/uL   RBC 3.20 (L) 4.22 - 5.81 MIL/uL   Hemoglobin 9.5 (L) 13.0 - 17.0 g/dL   HCT 29.1 (L) 39.0 - 52.0 %   MCV 90.9 78.0 - 100.0 fL   MCH 29.7 26.0 - 34.0 pg   MCHC 32.6 30.0 - 36.0 g/dL   RDW 14.6 11.5 - 15.5 %   Platelets 691 (H) 150 - 400 K/uL  Creatinine, serum     Status: None   Collection Time: 11/29/15  4:29 PM  Result Value Ref Range   Creatinine, Ser 1.08 0.61 - 1.24 mg/dL   GFR calc non Af Amer >60 >60 mL/min   GFR calc Af Amer >60 >60 mL/min    Comment: (NOTE) The eGFR has been calculated using the CKD EPI equation. This calculation has not been validated in all clinical situations. eGFR's persistently <60 mL/min signify possible Chronic Kidney Disease.   Glucose, capillary     Status: Abnormal   Collection Time: 11/29/15 10:41 PM  Result Value Ref Range   Glucose-Capillary 117 (H) 65 - 99 mg/dL  Glucose, capillary     Status: None   Collection Time: 11/30/15  1:07 AM  Result Value Ref Range   Glucose-Capillary 96 65 - 99 mg/dL  CBC WITH DIFFERENTIAL     Status: Abnormal   Collection Time: 11/30/15  4:06 AM  Result Value Ref Range   WBC 7.4 4.0 - 10.5  K/uL   RBC 3.15 (L) 4.22 - 5.81 MIL/uL   Hemoglobin 9.2 (L) 13.0 - 17.0 g/dL   HCT 28.9 (L) 39.0 - 52.0 %   MCV 91.7 78.0 - 100.0 fL   MCH 29.2 26.0 - 34.0 pg   MCHC 31.8 30.0 - 36.0 g/dL   RDW 15.0 11.5 - 15.5 %   Platelets 757 (H) 150 - 400 K/uL   Neutrophils Relative % 79 %   Neutro Abs 5.8 1.7 - 7.7 K/uL   Lymphocytes Relative 14 %   Lymphs Abs 1.0 0.7 - 4.0 K/uL   Monocytes Relative 6 %   Monocytes Absolute 0.4 0.1 - 1.0 K/uL   Eosinophils Relative 2 %   Eosinophils Absolute 0.2 0.0 - 0.7 K/uL   Basophils Relative 0 %   Basophils Absolute 0.0 0.0 - 0.1 K/uL  Comprehensive metabolic panel     Status: Abnormal   Collection Time: 11/30/15  4:06 AM  Result Value Ref Range   Sodium 142 135 - 145 mmol/L   Potassium 4.7 3.5 - 5.1 mmol/L    Comment: DELTA CHECK NOTED SPECIMEN HEMOLYZED. HEMOLYSIS MAY AFFECT INTEGRITY  OF RESULTS.    Chloride 111 101 - 111 mmol/L   CO2 23 22 - 32 mmol/L   Glucose, Bld 88 65 - 99 mg/dL   BUN 10 6 - 20 mg/dL   Creatinine, Ser 1.16 0.61 - 1.24 mg/dL   Calcium 8.1 (L) 8.9 - 10.3 mg/dL   Total Protein 4.6 (L) 6.5 - 8.1 g/dL   Albumin 1.9 (L) 3.5 - 5.0 g/dL   AST 87 (H) 15 - 41 U/L   ALT 69 (H) 17 - 63 U/L   Alkaline Phosphatase 248 (H) 38 - 126 U/L   Total Bilirubin 1.4 (H) 0.3 - 1.2 mg/dL   GFR calc non Af Amer >60 >60 mL/min   GFR calc Af Amer >60 >60 mL/min    Comment: (NOTE) The eGFR has been calculated using the CKD EPI equation. This calculation has not been validated in all clinical situations. eGFR's persistently <60 mL/min signify possible Chronic Kidney Disease.    Anion gap 8 5 - 15  Glucose, capillary     Status: None   Collection Time: 11/30/15  5:37 AM  Result Value Ref Range   Glucose-Capillary 88 65 - 99 mg/dL  Glucose, capillary     Status: None   Collection Time: 11/30/15  7:14 AM  Result Value Ref Range   Glucose-Capillary 97 65 - 99 mg/dL  Magnesium     Status: None   Collection Time: 11/30/15  8:36 AM  Result Value  Ref Range   Magnesium 1.9 1.7 - 2.4 mg/dL  Glucose, capillary     Status: Abnormal   Collection Time: 11/30/15 12:03 PM  Result Value Ref Range   Glucose-Capillary 101 (H) 65 - 99 mg/dL   Comment 1 Notify RN   Glucose, capillary     Status: Abnormal   Collection Time: 11/30/15  4:28 PM  Result Value Ref Range   Glucose-Capillary 101 (H) 65 - 99 mg/dL  Glucose, capillary     Status: None   Collection Time: 11/30/15  8:39 PM  Result Value Ref Range   Glucose-Capillary 89 65 - 99 mg/dL   Comment 1 Notify RN   Glucose, capillary     Status: None   Collection Time: 12/01/15  6:27 AM  Result Value Ref Range   Glucose-Capillary 86 65 - 99 mg/dL   Comment 1 Notify RN   Glucose, capillary     Status: None   Collection Time: 12/01/15 10:43 AM  Result Value Ref Range   Glucose-Capillary 96 65 - 99 mg/dL   Comment 1 Notify RN   Glucose, capillary     Status: Abnormal   Collection Time: 12/01/15 11:53 AM  Result Value Ref Range   Glucose-Capillary 113 (H) 65 - 99 mg/dL  Glucose, capillary     Status: Abnormal   Collection Time: 12/01/15  4:42 PM  Result Value Ref Range   Glucose-Capillary 115 (H) 65 - 99 mg/dL  Glucose, capillary     Status: Abnormal   Collection Time: 12/01/15  9:47 PM  Result Value Ref Range   Glucose-Capillary 123 (H) 65 - 99 mg/dL      General: No acute distress HEENT: Poor dentition, granulation tissue at corner of oral stoma bilaterally Mood and affect are appropriate Heart: Regular rate and rhythm no rubs murmurs or extra sounds Lungs: Clear to auscultation, breathing unlabored, no rales or wheezes Abdomen: Positive bowel sounds, soft nontender to palpation, nondistended,midline abdominal incision packed with wet to dry gauze, good granulation tissue, no  purulent drainage, no surrounding erythema or maceration. Extremities: No clubbing, cyanosis, or edema Skin: No evidence of breakdown, no evidence of rash Neurologic: Cranial nerves II through XII intact,  motor strength is 4/5 in bilateral deltoid, bicep, tricep, grip, hip flexor, knee extensors, ankle dorsiflexor and plantar flexor Sensory exam normal sensation to light touch and proprioception in bilateral upper and lower extremities Cerebellar exam normal finger to nose to finger as well as heel to shin in bilateral upper and lower extremities Musculoskeletal: Full range of motion in all 4 extremities. No joint swelling   Assessment/Plan: 1. 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 Physiatrist is providing close team supervision and 24 hour management of active medical problems listed below. Physiatrist and rehab team continue to assess barriers to discharge/monitor patient progress toward functional and medical goals. FIM: Function - Bathing Position: Bed (bed level for perineal, w/c at sink for UB) Body parts bathed by patient: Right arm, Left arm, Chest Body parts bathed by helper: Front perineal area, Buttocks Bathing not applicable: Right upper leg, Left upper leg, Right lower leg, Left lower leg, Abdomen (only completed UB bathing, incontinent of BM RN assisted with hygiene at bed level) Assist Level:  (Mod assist)  Function- Upper Body Dressing/Undressing What is the patient wearing?: Hospital gown Assist Level: Set up Function - Lower Body Dressing/Undressing Assist for lower body dressing:  (only wearing incontinence brief)  Function - Toileting Toileting activity did not occur: No continent bowel/bladder event Toileting steps completed by helper: Adjust clothing prior to toileting, Performs perineal hygiene, Adjust clothing after toileting Assist level: Touching or steadying assistance (Pt.75%)  Function - Toilet Transfers Toilet transfer assistive device: Bedside commode Assist level to  bedside commode (at bedside): Touching or steadying assistance (Pt > 75%) Assist level from bedside commode (at bedside): Touching or steadying assistance (Pt > 75%)  Function - Chair/bed transfer Chair/bed transfer method: Stand pivot Chair/bed transfer assist level: Touching or steadying assistance (Pt > 75%) Chair/bed transfer assistive device: Armrests Chair/bed transfer details: Verbal cues for precautions/safety, Verbal cues for sequencing, Verbal cues for technique  Function - Locomotion: Wheelchair Will patient use wheelchair at discharge?: No Type: Manual Max wheelchair distance: 150 Assist Level: Supervision or verbal cues Assist Level: Supervision or verbal cues Assist Level: Supervision or verbal cues Turns around,maneuvers to table,bed, and toilet,negotiates 3% grade,maneuvers on rugs and over doorsills: No Function - Locomotion: Ambulation Assistive device: Walker-rolling Max distance: 30 Assist level: Touching or steadying assistance (Pt > 75%) Assist level: Touching or steadying assistance (Pt > 75%) Walk 50 feet with 2 turns activity did not occur: Safety/medical concerns Walk 150 feet activity did not occur: Safety/medical concerns Walk 10 feet on uneven surfaces activity did not occur: Safety/medical concerns  Function - Comprehension Comprehension: Auditory Comprehension assist level: Understands basic 75 - 89% of the time/ requires cueing 10 - 24% of the time  Function - Expression Expression: Verbal Expression assist level: Expresses basic 75 - 89% of the time/requires cueing 10 - 24% of the time. Needs helper to occlude trach/needs to repeat words.  Function - Social Interaction Social Interaction assist level: Interacts appropriately 75 - 89% of the time - Needs redirection for appropriate language or to initiate interaction.  Function - Problem Solving Problem solving assist level: Solves basic 25 - 49% of the time - needs direction more than half the  time  to initiate, plan or complete simple activities  Function - Memory Memory assist level: Recognizes or recalls 50 - 74% of the time/requires cueing 25 - 49% of the time  Medical Problem List and Plan: 1.  Debilitation secondary to respiratory failure/small bowel repair/septic shock Burman [employer] pneumonia /multiple medical-Cont rehabilitation program 2.  DVT Prophylaxis/Anticoagulation: Subcutaneous heparin. Monitor for any bleeding episode 3. Pain Management: Oxycodone as needed 4. Acute blood loss anemia. Follow-up CBC, Hgb 9.2 5. Neuropsych: This patient is capable of making decisions on his own behalf. 6. Skin/Wound Care: Routine skin checks 7. Fluids/Electrolytes/Nutrition: Routine I&O with follow-up chemistries 8. A. fib with RVR/complete heart block. Continue amiodarone.Cardiac rate controlled. 9. Acute on chronic systolic congestive heart failure. Monitor for any signs of fluid overload. Continue Lasix 40 mg daily as directed, no signs of peripheral edema 10. Adrenal insufficiency. Weaning Solu-Cortef 11. Mood/bipolar disorder.  Lamictal 200 mg daily at bedtime as well as Seroquel 50 mg daily at bedtime and titrate up the home dose of 300 mg as needed. 12. Decreased nutritional storage. Advance diet as tolerated. 13. Alcohol polysubstance abuse. Counseling. Monitor for any signs of withdrawal, neuropsych evaluation 14. Hypokalemia. Continue potassium supplement. Follow-up chemistries, K 4.7 15. Oral ulcer, both on mucosal and oral commissure,angular chelitis, will start antifungal cream 16. Hypoalbuminemia Protostat, dietary consult LOS (Days) 3 A FACE TO FACE EVALUATION WAS PERFORMED  Lewin Pellow E 12/02/2015, 10:42 AM

## 2015-12-02 NOTE — Progress Notes (Signed)
Physical Therapy Session Note  Patient Details  Name: Shawn [name] MRN: [id number] Date of Birth: 1950/11/25  Today's Date: 12/02/2015 PT Individual Time: 1415-1540 PT Individual Time Calculation (min): 85 min   Short Term Goals: Week 1:  PT Short Term Goal 1 (Week 1): Pt will perform stand pivot transfer with S PT Short Term Goal 2 (Week 1): Pt will perform bed mobility with S PT Short Term Goal 3 (Week 1): Pt will perform gait with LRAD x100' with CGA and LRAD PT Short Term Goal 4 (Week 1): Pt will initiate stair training  Skilled Therapeutic Interventions/Progress Updates:    Patient received supine in bed and agreeable to PT. WC mobility for 137f with Supervision A with cues for doorway management. Gait training for 565fwith Min A and RW. Gait training for 15 ft on uneven surface with Min A from PT. Patient performed bed<>chair transfer x 5 throughout treatment session with MinA and moderate verbal instruction for proper positioning and increased safety with transfer. Step up/down to 4 inch step x 5 each LE. Step/down to 6 inch step x 5 each LE with rest break between R and L. Patient reports increased dizziness with with step ups. BP taken in sitting 105/52. Retaken after 3 minute rest break: 101/58. Patient reports increase in dizziness and requests to lie down. Stand pivot transfer performed with RW, and sit>supine with min A for LE. BP taken in supine 89/38. BP retaken in supine after 2 minutes: 106/67. Patient reports slight decrease in dizziness. Supine>sit EOB with Supervision A; BP 97/66. Patient performed ambulatory transfer to RW for 5 ft, Min A. WC mobility for [id number] supervision A to room. Chair>bed transfer with minA followed by bed mobility with supervision to supine position with Min A. Patient BP rechecked at end of PT session:99/55 in supine. Patient left in bed with call bell and all other needs Met.    Therapy Documentation Precautions:  Precautions Precautions:  Fall Restrictions Weight Bearing Restrictions: No General:   Vital Signs: Therapy Vitals Temp: 97.6 F (36.4 C) Temp Source: Oral Pulse Rate: 62 Resp: 20 BP: (!) 110/53 mmHg Patient Position (if appropriate): Lying Oxygen Therapy SpO2: 100 % O2 Device: Not Delivered Pain: Pain Assessment Pain Assessment: 0-10 Pain Score: 8  Pain Type: Surgical pain Pain Location: Abdomen Pain Orientation: Mid Pain Descriptors / Indicators: Aching Pain Frequency: Constant Pain Onset: On-going Patients Stated Pain Goal: 6 Pain Intervention(s): Medication (See eMAR)   See Function Navigator for Current Functional Status.   Therapy/Group: Individual Therapy  AusLorie Phenix1/2017, 4:52 PM

## 2015-12-03 ENCOUNTER — Inpatient Hospital Stay (HOSPITAL_COMMUNITY): Payer: Medicare Other | Admitting: Physical Therapy

## 2015-12-03 LAB — GLUCOSE, CAPILLARY
GLUCOSE-CAPILLARY: 111 mg/dL — AB (ref 65–99)
GLUCOSE-CAPILLARY: 151 mg/dL — AB (ref 65–99)
Glucose-Capillary: 123 mg/dL — ABNORMAL HIGH (ref 65–99)
Glucose-Capillary: 89 mg/dL (ref 65–99)

## 2015-12-03 MED ORDER — ENOXAPARIN SODIUM 40 MG/0.4ML ~~LOC~~ SOLN
40.0000 mg | SUBCUTANEOUS | Status: DC
  Administered 2015-12-03 – 2015-12-10 (×8): 40 mg via SUBCUTANEOUS
  Filled 2015-12-03 (×8): qty 0.4

## 2015-12-03 NOTE — Progress Notes (Signed)
Patient complaining of pain in abdomen unrelieved by currently prescribed pain medication.  Dr. Letta Pate notified; no new orders received at this time.  Will continue to monitor.

## 2015-12-03 NOTE — Progress Notes (Signed)
Physical Therapy Session Note  Patient Details  Name: Shawn [name] MRN: [id number] Date of Birth: 1950-12-04  Today's Date: 12/03/2015 PT Individual Time: [id number] PT Individual Time Calculation (min): 44 min   Short Term Goals: Week 1:  PT Short Term Goal 1 (Week 1): Pt will perform stand pivot transfer with S PT Short Term Goal 2 (Week 1): Pt will perform bed mobility with S PT Short Term Goal 3 (Week 1): Pt will perform gait with LRAD x100' with CGA and LRAD PT Short Term Goal 4 (Week 1): Pt will initiate stair training  Skilled Therapeutic Interventions/Progress Updates:    Pt received in bed with RN present. Pt agreeable to PT & noted 9/10 abdominal pain after RN exited room therefore PT notified RN of pt's c/o pain. Pt willing to participate in PT & agreeable to getting dressed. Pt transferred supine>sit with supervision & use of bed features; pt able to complete transfer quickly.  Pt able to don shirt without assistance, and only required assistance to thread pants on LLE. Pt transferred to standing to don pants when PT observed saturated brief but pt unaware. PT assisted pt with doffing brief while pt performed hygiene tasks with supervision & 1 UE support. Pt required frequent sitting rest breaks after standing only 30 seconds-1 minute at a time. PT assisted pt with donning new brief when pt reported need to urinate. Pt able to stand & complete toilet task with supervision. Pt chose to then return supine & reported dizziness; BP = 102/52 mmHg. With encouragement from PT pt agreeable to go to gym. Pt transferred sitting EOB>w/c via stand pivot with supervision & poor eccentric control stand>sit. Pt continued to report dizziness & BP in sitting = 98/42 mmHg; PT notified nurse of pt's orthostatic diastolic BP. Pt returned to bed via stand pivot as noted above & positioned in trendelenburg position where he performed BLE ankle pumps. Pt's BP then increased to 97/56 mmHg. Pt performed bridging  2 sets of 10 reps but unable to fully extend hips. PT provided cuing for proper technique of exercise but pt with poor demonstration. At end of session pt left in bed with all needs within reach.   Therapy Documentation Precautions:  Precautions Precautions: Fall Restrictions Weight Bearing Restrictions: No  Pain: Pain Assessment Pain Assessment: 0-10 Pain Score: 9  Pain Type: Acute pain Pain Location: Abdomen Pain Orientation: Medial Pain Intervention(s): RN made aware;Repositioned   See Function Navigator for Current Functional Status.   Therapy/Group: Individual Therapy  [name] 12/03/2015, 5:10 PM

## 2015-12-03 NOTE — Progress Notes (Signed)
Subjective/Complaints: Feeling okay, slept well, wondering about IV  ROS-postsurgical abdominal pain, having bowel movements, condom cath for bladder, no chest pain or shortness of breath no nausea or vomiting  Objective: Vital Signs: Blood pressure 110/51, pulse 62, temperature 98.8 F (37.1 C), temperature source Oral, resp. rate 18, height 5\' 11"  (1.803 m), weight 74.5 kg (164 lb 3.9 oz), SpO2 93 %. No results found. Results for orders placed or performed during the hospital encounter of 11/29/15 (from the past 72 hour(s))  Glucose, capillary     Status: Abnormal   Collection Time: 11/30/15 12:03 PM  Result Value Ref Range   Glucose-Capillary 101 (H) 65 - 99 mg/dL   Comment 1 Notify RN   Glucose, capillary     Status: Abnormal   Collection Time: 11/30/15  4:28 PM  Result Value Ref Range   Glucose-Capillary 101 (H) 65 - 99 mg/dL  Glucose, capillary     Status: None   Collection Time: 11/30/15  8:39 PM  Result Value Ref Range   Glucose-Capillary 89 65 - 99 mg/dL   Comment 1 Notify RN   Glucose, capillary     Status: None   Collection Time: 12/01/15  6:27 AM  Result Value Ref Range   Glucose-Capillary 86 65 - 99 mg/dL   Comment 1 Notify RN   Glucose, capillary     Status: None   Collection Time: 12/01/15 10:43 AM  Result Value Ref Range   Glucose-Capillary 96 65 - 99 mg/dL   Comment 1 Notify RN   Glucose, capillary     Status: Abnormal   Collection Time: 12/01/15 11:53 AM  Result Value Ref Range   Glucose-Capillary 113 (H) 65 - 99 mg/dL  Glucose, capillary     Status: Abnormal   Collection Time: 12/01/15  4:42 PM  Result Value Ref Range   Glucose-Capillary 115 (H) 65 - 99 mg/dL  Glucose, capillary     Status: Abnormal   Collection Time: 12/01/15  9:47 PM  Result Value Ref Range   Glucose-Capillary 123 (H) 65 - 99 mg/dL  Glucose, capillary     Status: Abnormal   Collection Time: 12/02/15  7:27 AM  Result Value Ref Range   Glucose-Capillary 115 (H) 65 - 99 mg/dL   Comment 1 Notify RN   Glucose, capillary     Status: Abnormal   Collection Time: 12/02/15 11:45 AM  Result Value Ref Range   Glucose-Capillary 108 (H) 65 - 99 mg/dL   Comment 1 Notify RN   Glucose, capillary     Status: None   Collection Time: 12/02/15  4:51 PM  Result Value Ref Range   Glucose-Capillary 99 65 - 99 mg/dL   Comment 1 Notify RN   Glucose, capillary     Status: Abnormal   Collection Time: 12/02/15  8:56 PM  Result Value Ref Range   Glucose-Capillary 103 (H) 65 - 99 mg/dL  Glucose, capillary     Status: Abnormal   Collection Time: 12/03/15  6:49 AM  Result Value Ref Range   Glucose-Capillary 111 (H) 65 - 99 mg/dL      General: No acute distress HEENT: Poor dentition, granulation tissue at corner of oral stoma bilaterally Mood and affect are appropriate Heart: Regular rate and rhythm no rubs murmurs or extra sounds Lungs: Clear to auscultation, breathing unlabored, no rales or wheezes Abdomen: Positive bowel sounds, soft nontender to palpation, nondistended,midline abdominal incision packed with wet to dry gauze, good granulation tissue, no purulent drainage, no  surrounding erythema or maceration. Extremities: No clubbing, cyanosis, or edema Skin: No evidence of breakdown, no evidence of rash Neurologic: Cranial nerves II through XII intact, motor strength is 4/5 in bilateral deltoid, bicep, tricep, grip, hip flexor, knee extensors, ankle dorsiflexor and plantar flexor Sensory exam normal sensation to light touch and proprioception in bilateral upper and lower extremities Cerebellar exam normal finger to nose to finger as well as heel to shin in bilateral upper and lower extremities Musculoskeletal: Full range of motion in all 4 extremities. No joint swelling   Assessment/Plan: 1. Functional deficits secondary to respiratory failure/small bowel repair/septic shock Burman Blacksmith pneumonia /multiple medical which require 3+ hours per day of interdisciplinary therapy in a  comprehensive inpatient rehab setting. Physiatrist is providing close team supervision and 24 hour management of active medical problems listed below. Physiatrist and rehab team continue to assess barriers to discharge/monitor patient progress toward functional and medical goals. FIM: Function - Bathing Position: Wheelchair/chair at sink Body parts bathed by patient: Right arm, Left arm, Chest, Right upper leg, Left upper leg Body parts bathed by helper: Left lower leg, Right lower leg, Back, Front perineal area, Buttocks Bathing not applicable: Abdomen Assist Level:  (mod assist)  Function- Upper Body Dressing/Undressing What is the patient wearing?: Hospital gown Assist Level: Set up Set up : To obtain clothing/put away Function - Lower Body Dressing/Undressing Assist for lower body dressing:  (wearing incontinence brief- total assist to don)  Function - Toileting Toileting activity did not occur: No continent bowel/bladder event Toileting steps completed by patient: Adjust clothing prior to toileting, Adjust clothing after toileting Toileting steps completed by helper: Performs perineal hygiene Toileting Assistive Devices: Grab bar or rail Assist level: Touching or steadying assistance (Pt.75%)  Function - Toilet Transfers Toilet transfer assistive device: Elevated toilet seat/BSC over toilet, Grab bar Assist level to toilet: Touching or steadying assistance (Pt > 75%) Assist level from toilet: Touching or steadying assistance (Pt > 75%) Assist level to bedside commode (at bedside): Touching or steadying assistance (Pt > 75%) Assist level from bedside commode (at bedside): Touching or steadying assistance (Pt > 75%)  Function - Chair/bed transfer Chair/bed transfer method: Stand pivot Chair/bed transfer assist level: Touching or steadying assistance (Pt > 75%) Chair/bed transfer assistive device: Armrests, Walker Chair/bed transfer details: Verbal cues for technique, Verbal  cues for precautions/safety  Function - Locomotion: Wheelchair Will patient use wheelchair at discharge?: Yes Type: Manual Max wheelchair distance: 150 Assist Level: Supervision or verbal cues Assist Level: Supervision or verbal cues Assist Level: Supervision or verbal cues Turns around,maneuvers to table,bed, and toilet,negotiates 3% grade,maneuvers on rugs and over doorsills: No Function - Locomotion: Ambulation Assistive device: Walker-rolling Max distance: 31ft Assist level: Touching or steadying assistance (Pt > 75%) Assist level: Touching or steadying assistance (Pt > 75%) Walk 50 feet with 2 turns activity did not occur: Safety/medical concerns Assist level: Touching or steadying assistance (Pt > 75%) Walk 150 feet activity did not occur: Safety/medical concerns Walk 10 feet on uneven surfaces activity did not occur: Safety/medical concerns Assist level: Touching or steadying assistance (Pt > 75%)  Function - Comprehension Comprehension: Auditory Comprehension assist level: Understands basic 75 - 89% of the time/ requires cueing 10 - 24% of the time  Function - Expression Expression: Verbal Expression assist level: Expresses basic 75 - 89% of the time/requires cueing 10 - 24% of the time. Needs helper to occlude trach/needs to repeat words.  Function - Social Interaction Social Interaction assist level: Interacts appropriately 90%  of the time - Needs monitoring or encouragement for participation or interaction.  Function - Problem Solving Problem solving assist level: Solves basic 25 - 49% of the time - needs direction more than half the time to initiate, plan or complete simple activities  Function - Memory Memory assist level: Recognizes or recalls 50 - 74% of the time/requires cueing 25 - 49% of the time Patient normally able to recall (first 3 days only): That he or she is in a hospital  Medical Problem List and Plan: 1.  Debilitation secondary to respiratory  failure/small bowel repair/septic shock Burman Blacksmith pneumonia /multiple medical-Cont rehab, Min guard assist for transfers 2.  DVT Prophylaxis/Anticoagulation: Subcutaneous heparin. Monitor for any bleeding episode 3. Pain Management: Oxycodone as needed 4. Acute blood loss anemia. Follow-up CBC, Hgb 9.2 5. Neuropsych: This patient is capable of making decisions on his own behalf. 6. Skin/Wound Care: Routine skin checks 7. Fluids/Electrolytes/Nutrition: Routine I&O with follow-up chemistries 8. A. fib with RVR/complete heart block. Continue amiodarone.Cardiac rate controlled. 9. Acute on chronic systolic congestive heart failure. Monitor for any signs of fluid overload. Continue Lasix 40 mg daily as directed, no signs of peripheral edema 10. Adrenal insufficiency. Solu-Cortef finished, DC IV 11. Mood/bipolar disorder.  Lamictal 200 mg daily at bedtime as well as Seroquel 50 mg daily at bedtime and titrate up the home dose of 300 mg as needed. 12. Decreased nutritional storage. Advance diet as tolerated.intake 25-50% meals, improving 13. Alcohol polysubstance abuse. Counseling. Monitor for any signs of withdrawal, neuropsych evaluation 14. Hypokalemia. Continue potassium supplement. Follow-up chemistries, K 4.7 15. Oral ulcer, both on mucosal and oral commissure,angular chelitis, will start antifungal cream 16. Hypoalbuminemia Protostat, dietary consult LOS (Days) 4 A FACE TO FACE EVALUATION WAS PERFORMED  Shawn [name] E 12/03/2015, 9:42 AM

## 2015-12-04 ENCOUNTER — Inpatient Hospital Stay (HOSPITAL_COMMUNITY): Payer: Medicare Other | Admitting: Physical Therapy

## 2015-12-04 ENCOUNTER — Inpatient Hospital Stay (HOSPITAL_COMMUNITY): Payer: Medicare Other | Admitting: Occupational Therapy

## 2015-12-04 DIAGNOSIS — G9340 Encephalopathy, unspecified: Secondary | ICD-10-CM

## 2015-12-04 LAB — GLUCOSE, CAPILLARY
GLUCOSE-CAPILLARY: 122 mg/dL — AB (ref 65–99)
Glucose-Capillary: 83 mg/dL (ref 65–99)
Glucose-Capillary: 116 mg/dL — ABNORMAL HIGH (ref 65–99)
Glucose-Capillary: 117 mg/dL — ABNORMAL HIGH (ref 65–99)

## 2015-12-04 NOTE — Progress Notes (Signed)
Occupational Therapy Session Note  Patient Details  Name: Shawn [name] MRN: [id number] Date of Birth: 23-Jul-1951  Today's Date: 12/04/2015 OT Individual Time: 1105-1200 OT Individual Time Calculation (min): 55 min    Short Term Goals: Week 1:  OT Short Term Goal 1 (Week 1): Pt will complete bathing at sit > stand level with min assist OT Short Term Goal 2 (Week 1): Pt will complete UB bathing with min assist OT Short Term Goal 3 (Week 1): Pt will complete LB dressing with min assist at sit > stand level OT Short Term Goal 4 (Week 1): Pt will complete toilet transfers with min assist with LRAD OT Short Term Goal 5 (Week 1): Pt will complete 1 grooming task in standing with min assist  Skilled Therapeutic Interventions/Progress Updates:    Treatment session with focus on activity tolerance and BUE strengthening.  Pt with c/o pain in abdomen and requesting to not leave bed.  Pt reports feeling that he is making some progress towards goals but feeling somewhat overwhelmed and asking to speak to a minister.  Notified [facility] to contact chaplain services.  Pt engaged in [place] in bed with 2# progressing to 3# dowel rod completing 2 reps of 12 each.  Issued theraband and instructed pt in BUE strengthening activities at bed level, encouraging pt to increase tension as able and to utilize between therapy sessions.  Chaplain arrived at end of session and left with pt.  Therapy Documentation Precautions:  Precautions Precautions: Fall Restrictions Weight Bearing Restrictions: No Pain: Pain Assessment Pain Assessment: 0-10 Pain Score: 9  Pain Type: Acute pain;Surgical pain Pain Location: Abdomen Pain Orientation: Mid Pain Descriptors / Indicators: Aching;Stabbing Pain Onset: On-going Patients Stated Pain Goal: 3 Pain Intervention(s): RN made aware Multiple Pain Sites: No  See Function Navigator for Current Functional Status.   Therapy/Group: Individual  Therapy  Simonne Come 12/04/2015, 12:07 PM

## 2015-12-04 NOTE — Progress Notes (Signed)
   12/04/15 1300  Clinical Encounter Type  Visited With Patient  Visit Type Spiritual support  Referral From Social work  Spiritual Encounters  Spiritual Needs Sacred text;Emotional  Stress Factors  Patient Stress Factors Financial concerns;Health changes  Chaplain responded to consult. Patient primarily needed company but also expressed an interest in having a Bible, so I got him one. The problem is he cannot see without his glasses, which he does not have, so I read to him. He is wondering if the [facility] could help him get glasses.

## 2015-12-04 NOTE — Progress Notes (Signed)
Subjective/Complaints: Patient was complaining of back pain yesterday today it is more lower abdominal pain. We discussed his wound as well as the etiology of his abdominal issues. 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 rate 18, height 5\' 11"  (1.803 m), weight 76 kg (167 lb 8.8 oz), SpO2 99 %. No results found. Results for orders placed or performed during the hospital encounter of 11/29/15 (from the past 72 hour(s))  Glucose, capillary     Status: Abnormal   Collection Time: 12/01/15  4:42 PM  Result Value Ref Range   Glucose-Capillary 115 (H) 65 - 99 mg/dL  Glucose, capillary     Status: Abnormal   Collection Time: 12/01/15  9:47 PM  Result Value Ref Range   Glucose-Capillary 123 (H) 65 - 99 mg/dL  Glucose, capillary     Status: Abnormal   Collection Time: 12/02/15  7:27 AM  Result Value Ref Range   Glucose-Capillary 115 (H) 65 - 99 mg/dL   Comment 1 Notify RN   Glucose, capillary     Status: Abnormal   Collection Time: 12/02/15 11:45 AM  Result Value Ref Range   Glucose-Capillary 108 (H) 65 - 99 mg/dL   Comment 1 Notify RN   Glucose, capillary     Status: None   Collection Time: 12/02/15  4:51 PM  Result Value Ref Range   Glucose-Capillary 99 65 - 99 mg/dL   Comment 1 Notify RN   Glucose, capillary     Status: Abnormal   Collection Time: 12/02/15  8:56 PM  Result Value Ref Range   Glucose-Capillary 103 (H) 65 - 99 mg/dL  Glucose, capillary     Status: Abnormal   Collection Time: 12/03/15  6:49 AM  Result Value Ref Range   Glucose-Capillary 111 (H) 65 - 99 mg/dL  Glucose, capillary     Status:  Abnormal   Collection Time: 12/03/15 11:39 AM  Result Value Ref Range   Glucose-Capillary 151 (H) 65 - 99 mg/dL  Glucose, capillary     Status: Abnormal   Collection Time: 12/03/15  4:21 PM  Result Value Ref Range   Glucose-Capillary 123 (H) 65 - 99 mg/dL  Glucose, capillary     Status: None   Collection Time: 12/03/15  9:05 PM  Result Value Ref Range   Glucose-Capillary 89 65 - 99 mg/dL  Glucose, capillary     Status: None   Collection Time: 12/04/15  5:50 AM  Result Value Ref Range   Glucose-Capillary 83 65 - 99 mg/dL  Glucose, capillary     Status: Abnormal   Collection Time: 12/04/15 11:16 AM  Result Value Ref Range   Glucose-Capillary 116 (H) 65 - 99 mg/dL      General: No acute distress HEENT: Poor dentition, granulation tissue at corner of oral stoma bilaterally Mood and affect are appropriate Heart: Regular rate and rhythm no rubs murmurs or extra sounds Lungs: Clear to auscultation, breathing unlabored, no rales or wheezes Abdomen: Positive bowel sounds, soft nontender to palpation, nondistended,midline abdominal incision packed with wet to dry gauze, good granulation tissue, no purulent drainage, no surrounding erythema or maceration. Extremities: No clubbing, cyanosis, or edema Skin: No evidence of breakdown,  no evidence of rash Neurologic: Cranial nerves II through XII intact, motor strength is 4/5 in bilateral deltoid, bicep, tricep, grip, hip flexor, knee extensors, ankle dorsiflexor and plantar flexor Sensory exam normal sensation to light touch and proprioception in bilateral upper and lower extremities Cerebellar exam normal finger to nose to finger as well as heel to shin in bilateral upper and lower extremities Musculoskeletal: Full range of motion in all 4 extremities. No joint swelling   Assessment/Plan: 1. Functional deficits secondary to respiratory failure/small bowel repair/septic shock Burman Blacksmith pneumonia /multiple medical which require 3+ hours per  day of interdisciplinary therapy in a comprehensive inpatient rehab setting. Physiatrist is providing close team supervision and 24 hour management of active medical problems listed below. Physiatrist and rehab team continue to assess barriers to discharge/monitor patient progress toward functional and medical goals. FIM: Function - Bathing Position: Wheelchair/chair at sink Body parts bathed by patient: Right arm, Left arm, Chest, Right upper leg, Left upper leg Body parts bathed by helper: Left lower leg, Right lower leg, Back, Front perineal area, Buttocks Bathing not applicable: Abdomen Assist Level:  (mod assist)  Function- Upper Body Dressing/Undressing What is the patient wearing?: Hospital gown Assist Level: Set up Set up : To obtain clothing/put away Function - Lower Body Dressing/Undressing Assist for lower body dressing:  (wearing incontinence brief- total assist to don)  Function - Toileting Toileting activity did not occur: No continent bowel/bladder event Toileting steps completed by patient: Adjust clothing prior to toileting, Adjust clothing after toileting Toileting steps completed by helper: Performs perineal hygiene Toileting Assistive Devices: Grab bar or rail Assist level: Touching or steadying assistance (Pt.75%)  Function - Toilet Transfers Toilet transfer assistive device: Elevated toilet seat/BSC over toilet, Grab bar Assist level to toilet: Touching or steadying assistance (Pt > 75%) Assist level from toilet: Touching or steadying assistance (Pt > 75%) Assist level to bedside commode (at bedside): Touching or steadying assistance (Pt > 75%) Assist level from bedside commode (at bedside): Touching or steadying assistance (Pt > 75%)  Function - Chair/bed transfer Chair/bed transfer method: Stand pivot Chair/bed transfer assist level: Supervision or verbal cues Chair/bed transfer assistive device: Armrests Chair/bed transfer details: Verbal cues for  technique  Function - Locomotion: Wheelchair Will patient use wheelchair at discharge?:  (TBD) Type: Manual Max wheelchair distance: 150 Assist Level: Supervision or verbal cues Assist Level: Supervision or verbal cues Assist Level: Supervision or verbal cues Turns around,maneuvers to table,bed, and toilet,negotiates 3% grade,maneuvers on rugs and over doorsills: No Function - Locomotion: Ambulation Assistive device: Walker-rolling Max distance: 70 Assist level: Touching or steadying assistance (Pt > 75%) Assist level: Touching or steadying assistance (Pt > 75%) Walk 50 feet with 2 turns activity did not occur: Safety/medical concerns Assist level: Touching or steadying assistance (Pt > 75%) Walk 150 feet activity did not occur: Safety/medical concerns Walk 10 feet on uneven surfaces activity did not occur: Safety/medical concerns Assist level: Touching or steadying assistance (Pt > 75%)  Function - Comprehension Comprehension: Auditory Comprehension assist level: Understands basic 75 - 89% of the time/ requires cueing 10 - 24% of the time  Function - Expression Expression: Verbal Expression assist level: Expresses basic 75 - 89% of the time/requires cueing 10 - 24% of the time. Needs helper to occlude trach/needs to repeat words.  Function - Social Interaction Social Interaction assist level: Interacts appropriately 90% of the time - Needs monitoring or encouragement for participation or interaction.  Function - Problem Solving Problem solving assist level: Solves  basic 25 - 49% of the time - needs direction more than half the time to initiate, plan or complete simple activities  Function - Memory Memory assist level: Recognizes or recalls 50 - 74% of the time/requires cueing 25 - 49% of the time Patient normally able to recall (first 3 days only): That he or she is in a hospital  Medical Problem List and Plan: 1.  Debilitation secondary to respiratory failure/small bowel  repair/septic shock Burman Blacksmith pneumonia /multiple medical-Cont rehab, Min guard assist for transfers 2.  DVT Prophylaxis/Anticoagulation: Subcutaneous heparin. Monitor for any bleeding episode 3. Pain Management: Oxycodone as needed 4. Acute blood loss anemia. Follow-up CBC, Hgb 9.2 5. Neuropsych: This patient is capable of making decisions on his own behalf. 6. Skin/Wound Care: Routine skin checks 7. Fluids/Electrolytes/Nutrition: Routine I&O with follow-up chemistries 8. A. fib with RVR/complete heart block. Continue amiodarone.Cardiac rate controlled. 9. Acute on chronic systolic congestive heart failure. Monitor for any signs of fluid overload. Continue Lasix 40 mg daily as directed, no signs of peripheral edema 10. Adrenal insufficiency.completed Solu-Cortef 11. Mood/bipolar disorder.  Lamictal 200 mg daily at bedtime as well as Seroquel 50 mg daily at bedtime and titrate up the home dose of 300 mg as needed. 12. Decreased nutritional storage. Advance diet as tolerated.intake 10-50% meals,  13. Alcohol polysubstance abuse. Counseling. Monitor for any signs of withdrawal, neuropsych evaluation 14. Hypokalemia. Continue potassium supplement. Follow-up chemistries, K 4.7 15. Oral ulcer, both on mucosal and oral commissure,angular chelitis, will start antifungal cream 16. Hypoalbuminemia Protostat, dietary consult 17. Poor memory, this may be evidence of postoperative versus alcoholic encephalopathy, will ask speech therapy to evaluate LOS (Days) 5 A FACE TO FACE EVALUATION WAS PERFORMED  KIRSTEINS,ANDREW E 12/04/2015, 1:28 PM

## 2015-12-04 NOTE — Progress Notes (Signed)
Social Work Patient ID: Shawn [name], male   DOB: 09/06/1950, 65 y.o.   MRN: [id number]  Met with pt to inform sister has been sick with the flu and she has not been able to get in here. Chaplain has been in and talked with pt which he found helpful. Have left message for son to make sure pt can come home with him once ready for discharge, otherwise will need to consider alternatives. He reports he is pushing through the Pain and attending therapies. 

## 2015-12-04 NOTE — Progress Notes (Signed)
Physical Therapy Session Note  Patient Details  Name: Shawn [name] MRN: [id number] Date of Birth: November 20, 1950  Today's Date: 12/04/2015 PT Individual Time: [phone] and 1430-1530 PT Individual Time Calculation (min): 75 min and 60 min (total 135 min)   Short Term Goals: Week 1:  PT Short Term Goal 1 (Week 1): Pt will perform stand pivot transfer with S PT Short Term Goal 2 (Week 1): Pt will perform bed mobility with S PT Short Term Goal 3 (Week 1): Pt will perform gait with LRAD x100' with CGA and LRAD PT Short Term Goal 4 (Week 1): Pt will initiate stair training  Skilled Therapeutic Interventions/Progress Updates:   Tx 1: Pt received seated in bed, c/o abdominal pain 9/10 however agreeable to participate. Supine>sit with S and HOB elevated. Squat pivot transfer to w/c with close S. Changed gown with setupA in seated position. W/c propulsion to gym 2x150' with S for UE strengthening and endurance. Transfer w/c<>mat table x4 reps with close S. Seated on mat table, performed BUE activity of matching screws/nuts/bolts. Pt then reports he needs to have a bowel movement; returned to room totalA for urgency. Stand pivot w/c <>toilet with grab bar and close S. Performs clothing management with min guard. Upon standing after using restroom, pt pulls up pants but not brief. Sit <>stand at sink while therapist donned new brief totalA. Returned to gym to complete activity, after several minutes pt becomes verbally agitated with activity, and requests to lie down on mat due to dizziness, gas. After several minute rest break, pt agreeable to ambulation. Gait 2 trials, x50' and x70' with min guard to S with RW. Pt very fatigued after each trial requiring seated rest break after initial trial, supine rest break on mat table after second trial, reporting "Let's go back to the room now, you're trying to kill me". Educated pt in importance of increasing endurance for safety and independence in the home at d/c.  Continues to decline additional treatment, states he "might be interested in shooting some ball, but not today, I just can't do any more today". Gait to return to w/c x20' with close S. Returned to room with w/c propulsion x150' with S. Stand pivot transfer to bed with S. Educated pt in bed features due to pt c/o inability to sleep on side on his bed like he was able to on the mat table. Pt lowers HOB with S, able to find comfortable position on L side. Remained in bed with alarm intact and all needs in reach at completion of session.  Tx 2: Pt received supine in bed, c/o 8/10 abdominal pain and agreeable to treatment. Requests to begin with exercises to "warm up a bit". Performed LE/UE strengthening exercises in bed including orange band exercises pt able to recall form OT session, SLR, hip adduction isometric, heel slides, ankle pumps. Discussed with pt goal of being able to walk from room to the gym, pt agreeable but does report he doesn't think he'll be able to do it today. Ambulates 2x60' with RW and close S, w/c follow to allow for seated rest break when fatigued. Pt reports after first 25' he needs a rest, however able to continue when encouraged to walk to set end-point (i.e. Nurses station, etc). Despite pt report of fatigue, no indication of fatigue such as gait speed or shortness of breath. Seated and standing basketball throwing with min guard for standing balance. Reports significant dizziness; seated BP assessed 94/49 HR 77. Supine rest break x3-4  min, upon return to sitting BP remained low at 94/55. Returned to w/c with S, w/c propulsion to room with S. Stand pivot to bed with S; upon returning to bed therapist noticed bed saturated with urine, pt reports he is unaware of bed being wet. Changed brief and gowns with modA sit <>stand from EOB, Remained supine in bed with all needs within reach, alarm intact.    Therapy Documentation Precautions:  Precautions Precautions:  Fall Restrictions Weight Bearing Restrictions: No General: PT Amount of Missed Time (min): 15 Minutes PT Missed Treatment Reason: Pain Pain: Pain Assessment Pain Assessment: 0-10 Pain Score: 9  Pain Type: Acute pain;Surgical pain Pain Location: Abdomen Pain Orientation: Mid Pain Descriptors / Indicators: Aching;Stabbing Pain Frequency: Constant Pain Onset: On-going Patients Stated Pain Goal: 3 Pain Intervention(s): RN made aware Multiple Pain Sites: No   See Function Navigator for Current Functional Status.   Therapy/Group: Individual Therapy  Luberta Mutter 12/04/2015, 3:51 PM

## 2015-12-05 ENCOUNTER — Inpatient Hospital Stay (HOSPITAL_COMMUNITY): Payer: Medicare Other | Admitting: Physical Therapy

## 2015-12-05 ENCOUNTER — Inpatient Hospital Stay (HOSPITAL_COMMUNITY): Payer: Self-pay | Admitting: Occupational Therapy

## 2015-12-05 ENCOUNTER — Inpatient Hospital Stay (HOSPITAL_COMMUNITY): Payer: Self-pay | Admitting: Speech Pathology

## 2015-12-05 ENCOUNTER — Inpatient Hospital Stay (HOSPITAL_COMMUNITY): Payer: Medicare Other | Admitting: *Deleted

## 2015-12-05 LAB — BASIC METABOLIC PANEL
ANION GAP: 9 (ref 5–15)
BUN: 8 mg/dL (ref 6–20)
CALCIUM: 8.5 mg/dL — AB (ref 8.9–10.3)
CO2: 25 mmol/L (ref 22–32)
CREATININE: 1.47 mg/dL — AB (ref 0.61–1.24)
Chloride: 106 mmol/L (ref 101–111)
GFR, EST AFRICAN AMERICAN: 55 mL/min — AB (ref >60)
GFR, EST NON AFRICAN AMERICAN: 48 mL/min — AB (ref >60)
GLUCOSE: 113 mg/dL — AB (ref 65–99)
Potassium: 3.7 mmol/L (ref 3.5–5.1)
Sodium: 140 mmol/L (ref 135–145)

## 2015-12-05 LAB — CBC WITH DIFFERENTIAL/PLATELET
BASOS ABS: 0.1 10*3/uL (ref 0.0–0.1)
Basophils Relative: 1 %
Eosinophils Absolute: 0.3 10*3/uL (ref 0.0–0.7)
Eosinophils Relative: 3 %
HCT: 33.6 % — ABNORMAL LOW (ref 39.0–52.0)
Hemoglobin: 10.8 g/dL — ABNORMAL LOW (ref 13.0–17.0)
LYMPHS ABS: 0.7 10*3/uL (ref 0.7–4.0)
Lymphocytes Relative: 9 %
MCH: 29.7 pg (ref 26.0–34.0)
MCHC: 32.1 g/dL (ref 30.0–36.0)
MCV: 92.3 fL (ref 78.0–100.0)
MONO ABS: 0.7 10*3/uL (ref 0.1–1.0)
Monocytes Relative: 9 %
NEUTROS ABS: 6.5 10*3/uL (ref 1.7–7.7)
Neutrophils Relative %: 78 %
PLATELETS: 626 10*3/uL — AB (ref 150–400)
RBC: 3.64 MIL/uL — AB (ref 4.22–5.81)
RDW: 15.6 % — AB (ref 11.5–15.5)
WBC: 8.3 10*3/uL (ref 4.0–10.5)

## 2015-12-05 LAB — GLUCOSE, CAPILLARY
GLUCOSE-CAPILLARY: 123 mg/dL — AB (ref 65–99)
GLUCOSE-CAPILLARY: 116 mg/dL — AB (ref 65–99)
Glucose-Capillary: 107 mg/dL — ABNORMAL HIGH (ref 65–99)
Glucose-Capillary: 121 mg/dL — ABNORMAL HIGH (ref 65–99)

## 2015-12-05 MED ORDER — POLYETHYLENE GLYCOL 3350 17 G PO PACK
17.0000 g | PACK | Freq: Every day | ORAL | Status: DC
  Administered 2015-12-05 – 2015-12-10 (×5): 17 g via ORAL
  Filled 2015-12-05 (×6): qty 1

## 2015-12-05 MED ORDER — FLEET ENEMA 7-19 GM/118ML RE ENEM: 1.0000 | ENEMA | Freq: Once | RECTAL | Status: DC

## 2015-12-05 MED ORDER — DOCUSATE SODIUM 100 MG PO CAPS
100.0000 mg | ORAL_CAPSULE | Freq: Two times a day (BID) | ORAL | Status: DC
  Administered 2015-12-05 – 2015-12-11 (×13): 100 mg via ORAL
  Filled 2015-12-05 (×13): qty 1

## 2015-12-05 NOTE — Progress Notes (Signed)
Physical Therapy Session Note  Patient Details  Name: Shawn [name] MRN: [id number] Date of Birth: Jul 09, 1951  Today's Date: 12/05/2015 PT Individual Time: 0800-0825 and [id number] PT Individual Time Calculation (min): 25 min and 30 min (total 55 min)   Short Term Goals: Week 1:  PT Short Term Goal 1 (Week 1): Pt will perform stand pivot transfer with S PT Short Term Goal 2 (Week 1): Pt will perform bed mobility with S PT Short Term Goal 3 (Week 1): Pt will perform gait with LRAD x100' with CGA and LRAD PT Short Term Goal 4 (Week 1): Pt will initiate stair training  Skilled Therapeutic Interventions/Progress Updates:   Tx 1: Pt received seated in w/c with vomit covering front of gown and on floor. Pt reports he was sick about 30 minutes ago, states he used call bell and no one came, however NT and RN were not notified of any request for assistance. Pt very frustrated, angry and reports he does not feel well enough to participate. Therapist cleaned mess, assisted pt with changing gown and socks, and washing body with modA. Stand pivot to return to bed with close S. Remained supine in bed at completion of session, all needs within reach.   Tx 2: Pt received supine in bed, continues to c/o pain and nausea however agreeable to attempt participating in session. Transfer bed>w/c with close S; pt requires cues to complete turn to avoid sitting on arm rest of chair. W/c propulsion 2x150' with BUE and S. Gait 2x90' with RW and close S. Requires seated rest break between trials due to fatigue. Pt reports he could likely walk further but "doesn't want to push it since I've been getting sick". Utilized rest breaks to discuss energy conservation, home safety. Remained seated in w/c at completion of session with quick release belt intact and all needs within reach.   Therapy Documentation Precautions:  Precautions Precautions: Fall Restrictions Weight Bearing Restrictions: No General: PT Amount of Missed  Time (min): 35 Minutes PT Missed Treatment Reason: Patient ill (Comment) (N/V) Pain: Pain Assessment Pain Assessment: 0-10 Pain Score: 6  Pain Type: Acute pain Pain Location: Abdomen Pain Orientation: Mid Pain Descriptors / Indicators: Aching;Stabbing Pain Frequency: Constant Pain Onset: On-going Patients Stated Pain Goal: 3 Pain Intervention(s): Other (Comment) (pre-medicated)   See Function Navigator for Current Functional Status.   Therapy/Group: Individual Therapy  Luberta Mutter 12/05/2015, 3:54 PM

## 2015-12-05 NOTE — Progress Notes (Signed)
Occupational Therapy Note  Patient Details  Name: Shawn [name] MRN: [id number] Date of Birth: 1950/11/19  Today's Date: 12/05/2015 OT Missed Time: 62 Minutes Missed Time Reason: Patient ill (comment)  Pt missed 60 mins skilled OT treatment secondary to refusal due to illness.  Pt reports nausea and vomiting this AM, now resulting in increased abdominal pain.  Pt requesting to rest and recuperate today and begin fresh tomorrow.  Reiterated importance of therapy to recovery with pt continuing to politely refuse.  Continue with POC.  Simonne Come 12/05/2015, 10:08 AM

## 2015-12-05 NOTE — Progress Notes (Signed)
Recreational Therapy Session Note  Patient Details  Name: Shawn [name] MRN: [id number] Date of Birth: 07-03-1951 Today's Date: 12/05/2015   Met with pt to discuss TR services, use of leisure time, discharge planning.  Pt pleasant and easily engaged in conversation but expressed no interest in TR due to on-going symptoms of "dizziness" and recent episodes of nausea/vomiting.  Full eval deferred, will continue to monitor through team. Kieran Arreguin 12/05/2015, 4:10 PM

## 2015-12-05 NOTE — Progress Notes (Signed)
Patient ID: Shawn [name], male   DOB: June 04, 1951, 65 y.o.   MRN: [id number]      [name]      [name] [place], [name] [phone]    Phone: [phone] FAX: [phone]     Subjective: Pt states he mostly has pain with movement.  Doesn't feel like percocet is working well. Tried to have a BM, but unable to evacuate.  Nausea this am.  Tolerating a diet.  Afebrile.  VSS.  S/p laparotomy x3 last on 3/21.   Objective:  Vital signs:  Filed Vitals:   12/04/15 0500 12/04/15 1306 12/04/15 1511 12/05/15 0517  BP: 115/63 106/57 94/49 104/63  Pulse: 63 60 77 68  Temp: 98.9 F (37.2 C) 98.3 F (36.8 C)  98.7 F (37.1 C)  TempSrc: Oral Oral  Oral  Resp: 18 18  18   Height:      Weight: 76 kg (167 lb 8.8 oz)   75 kg (165 lb 5.5 oz)  SpO2: 98% 99%  95%    Last BM Date: 12/04/15  Intake/Output   Yesterday:  04/03 0701 - 04/04 0700 In: 600 [P.O.:600] Out: 1300 [Urine:1300] This shift:  Total I/O In: 120 [P.O.:120] Out: 200 [Urine:200]   Physical Exam: General: Pt awake/alert/oriented and in no acute distress.  Abdomen: Soft.  Nondistended.  Tender over wound.  Wound is clean, fascia is intact, packing replaced.  No evidence of peritonitis.  No incarcerated hernias.    Problem List:   Active Problems:   Debility   Paroxysmal atrial fibrillation (HCC)   Acute on chronic systolic heart failure (HCC)   Adrenal insufficiency (HCC)   Alcohol abuse   Debilitated    Results:   Labs: Results for orders placed or performed during the hospital encounter of 11/29/15 (from the past 48 hour(s))  Glucose, capillary     Status: Abnormal   Collection Time: 12/03/15 11:39 AM  Result Value Ref Range   Glucose-Capillary 151 (H) 65 - 99 mg/dL  Glucose, capillary     Status: Abnormal   Collection Time: 12/03/15  4:21 PM  Result Value Ref Range   Glucose-Capillary 123 (H) 65 - 99 mg/dL  Glucose, capillary     Status: None    Collection Time: 12/03/15  9:05 PM  Result Value Ref Range   Glucose-Capillary 89 65 - 99 mg/dL  Glucose, capillary     Status: None   Collection Time: 12/04/15  5:50 AM  Result Value Ref Range   Glucose-Capillary 83 65 - 99 mg/dL  Glucose, capillary     Status: Abnormal   Collection Time: 12/04/15 11:16 AM  Result Value Ref Range   Glucose-Capillary 116 (H) 65 - 99 mg/dL  Glucose, capillary     Status: Abnormal   Collection Time: 12/04/15  4:10 PM  Result Value Ref Range   Glucose-Capillary 122 (H) 65 - 99 mg/dL   Comment 1 Notify RN   Glucose, capillary     Status: Abnormal   Collection Time: 12/04/15  9:08 PM  Result Value Ref Range   Glucose-Capillary 117 (H) 65 - 99 mg/dL  Glucose, capillary     Status: Abnormal   Collection Time: 12/05/15  6:53 AM  Result Value Ref Range   Glucose-Capillary 123 (H) 65 - 99 mg/dL    Imaging / Studies: No results found.  Medications / Allergies:  Scheduled Meds: . amiodarone  400 mg Oral BID  . antiseptic oral rinse  7 mL Mouth Rinse BID  . aspirin  81 mg Oral Daily  . docusate sodium  100 mg Oral BID  . enoxaparin (LOVENOX) injection  40 mg Subcutaneous Q24H  . folic acid  1 mg Oral Daily  . furosemide  40 mg Oral Daily  . insulin aspart  0-15 Units Subcutaneous TID WC  . lamoTRIgine  200 mg Oral QHS  . pantoprazole  40 mg Oral Daily  . polyethylene glycol  17 g Oral Daily  . QUEtiapine  50 mg Oral QHS  . sodium phosphate  1 enema Rectal Once   Continuous Infusions:  PRN Meds:.albuterol, magic mouthwash w/lidocaine, ondansetron **OR** ondansetron (ZOFRAN) IV, oxyCODONE-acetaminophen, sodium chloride, sorbitol, white petrolatum  Antibiotics: Anti-infectives    None        Assessment/Plan Exploratory laparotomy 11/17/15--Dr. Tsuei Exploratory laparotomy, SBR 11/19/15--Dr. Rosendo Gros Exploratory laparotomy, SB anastomosis and closure 11/21/15--Dr. Ninfa Linden  -abdomen is soft, minimal tenderness.  Doubt sbo or ileus, doubt  abscess or leak.  Consider non narcotic pain meds.  Appears constipated, give enema and start bowel regimen.  Check CBC and BMP.   -continue BID wet to dry dressing changes -will follow for now  Erby Pian, [facility] Surgery Pager [phone](7A-4:30P) For consults and floor pages call [phone](7A-4:30P)  12/05/2015 10:38 AM

## 2015-12-05 NOTE — Evaluation (Signed)
Speech Language Pathology Assessment and Plan  Patient Details  Name: Shawn [name] MRN: [id number] Date of Birth: 1951-07-05   Today's Date: 12/05/2015 SLP Individual Time: 0037-0488 SLP Individual Time Calculation (min): 30 min   Problem List:  Patient Active Problem List   Diagnosis Date Noted  . Debility 11/29/2015  . Debilitated 11/29/2015  . Paroxysmal atrial fibrillation (HCC)   . Acute on chronic systolic heart failure ([place])   . Adrenal insufficiency ([place])   . Alcohol abuse   . 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 Polysubstance abuse   . Bipolar affective disorder in remission ([place])   . History of ventilator dependency ([place])   . AKI (acute kidney injury) ([place])   . Dysphagia   . Tachycardia   . Tachypnea   . Hypokalemia   . Leukocytosis   . Acute blood loss anemia   . Acute respiratory failure ([place])   . Persistent atrial fibrillation ([place])   . Acute on chronic combined systolic and diastolic CHF (congestive heart failure) ([place])   . Acute respiratory failure with hypoxemia ([place])   . Cardiomyopathy, ischemic 11/19/2015  . Complete heart block ([place]) 11/18/2015  . Malnutrition of moderate degree 11/18/2015  . Pressure ulcer 11/17/2015  . Septic shock ([place]) 11/17/2015  . Pneumatosis coli 11/17/2015  . Gastroenteritis due to norovirus 11/25/2013  . Other pancytopenia ([place]) 11/25/2013  . Dehydration 11/20/2013  . MDD (major depressive disorder) ([place]) 11/17/2013  . Depression 11/17/2013  . Fall 11/16/2013  . Bipolar affective disorder, current episode mixed ([place]) 04/12/2013  . Alcohol abuse with intoxication ([place]) 04/12/2013  . Alcohol dependence ([place]) 04/12/2013  . Bipolar I disorder, most recent episode depressed ([place]) 04/12/2013  . Constipation 08/08/2012  . Hip fracture, left ([place]) 08/06/2012  . Traumatic hematoma of buttock 08/06/2012  . ETOH abuse 08/06/2012  . H/O: substance abuse 08/06/2012  . Tobacco abuse 08/06/2012  . History of hernia repair 08/06/2012  .  Hip fracture, intertrochanteric ([place]) 08/06/2012  . Bipolar disorder, current episode mixed, mild ([place]) 02/04/2012  . Health maintenance examination 10/24/2011  . Hypoxia 10/24/2011  . Prostate cancer screening 09/19/2011  . Tobacco dependence 09/08/2011  . Erectile dysfunction 08/06/2011  . Osteoarthritis of left knee 08/06/2011  . Elevated blood pressure reading without diagnosis of hypertension 08/06/2011  . History of depression 08/06/2011   Past Medical History:  Past Medical History  Diagnosis Date  . Osteoarthritis X years    left knee.  Distant hx (age 66) "dislocated" knee and required surgery, then crush injury to patella required knee cap removal.  . Diverticular disease     "itis" x 2 episodes (last was about 2007)  . Tobacco dependence   . Multiple rib fractures 10/09/10    s/p fall down a ravine--9th and 10th on right, contusion of left 7th and 8th ribs  . Fracture of metatarsal bone(s), closed     3rd, 4th, 5th mid shaft on left foot  . History of substance abuse     cocaine, marijuana, acid, alcohol (in remission since 2010)  . Solar dermatitis     face and ears  . Bipolar disorder [facility])     Has had multiple psych hospitalization in the past, most recently at [facility] 01/31/11-02/04/11.  Says meds made him emotionally blunted. (lamictal and wellbutrin).  . Alcoholism ([place])     "Dry" since 2002; relapse 2014  . Anxiety and depression   . Vitamin B12 deficiency 10/2013    IF ab positive.  Replacement therapy started end of March 2015  . History of septic shock 11/2015    ? due to small bowel ischemia: hospitalized + surgeries 11/2015  . Anxiety    Past Surgical History:  Past Surgical History  Procedure Laterality Date  . Knee dislocation surgery  1966  . Patellectomy  1968    s/p crush injury  . Inguinal hernia repair      left  . Hernia repair      at 65 years of age  29  . Total knee arthroplasty  11/20/2011    Procedure: TOTAL KNEE ARTHROPLASTY;   Surgeon: Ninetta Lights, MD;  Location: [facility];  Service: Orthopedics;  Laterality: Left;  DR Percell Miller WANTS 90MINUTES FOR THIS CASE  . Inguinal hernia repair  06/15/2012    Procedure: HERNIA REPAIR INGUINAL INCARCERATED;  Surgeon: Edward Jolly, MD;  Location: [facility];  Service: General;  Laterality: Right;  . Bowel resection  06/15/2012    Procedure: SMALL BOWEL RESECTION;  Surgeon: Edward Jolly, MD;  Location: [facility];  Service: General;  Laterality: N/A;  . Femur im nail  08/06/2012    Procedure: INTRAMEDULLARY (IM) NAIL FEMORAL;  Surgeon: Johnn Hai, MD;  Location: [facility];  Service: Orthopedics;  Laterality: Left;  . Laparotomy N/A 11/17/2015    Procedure: EXPLORATORY LAPAROTOMY;  Surgeon: Donnie Mesa, MD;  Location: [facility];  Service: General;  Laterality: N/A;  . Laparotomy N/A 11/19/2015    Procedure: EXPLORATORY LAPAROTOMY OPEN ABDOMEN ABDOMINAL WOUND VAC CHANGE;  Surgeon: Ralene Ok, MD;  Location: [facility];  Service: General;  Laterality: N/A;  . Bowel resection  11/19/2015    Procedure: SMALL BOWEL RESECTION;  Surgeon: Ralene Ok, MD;  Location: [facility];  Service: General;;  . Cardiac catheterization N/A 11/20/2015    Procedure: Temporary Wire;  Surgeon: Evans Lance, MD;  Location: [facility];  Service: Cardiovascular;  Laterality: N/A;  . Small bowel repair N/A 11/21/2015    Procedure: SMALL BOWEL REPAIR, SMALL BOWEL ANATAMOSIS AND ABDOMINAL CLOSURE;  Surgeon: Coralie Keens, MD;  Location: [facility];  Service: General;  Laterality: N/A;    Assessment / Plan / Recommendation Clinical Impression  MOHD. DERFLINGER is a 65 y.o. right handed male with history of polysubstance abuse, alcohol abuse, bipolar disorder, chronic systolic congestive heart failure. Presented 11/17/2015 with 3 day history of abdominal pain with nausea and vomiting as well as constipation with fever and chills. Patient noted history of small bowel resection in the past for incarcerated  femoral hernia perforation in 2013 and again in 2016. Findings of lactic acid of 12, serum creatinine 6.7. CT of abdomen and pelvis with extensive small bowel pneumatosis, mesenteric and portal venous gas. Underwent exploratory laparotomy 11/17/2015 and found to have viable gut no evidence of ischemic bowel. Patient extubated 11/26/2015. Diet advanced to soft. Patient was admitted for a comprehensive rehabilitation program and cognitive linguistic evaluation ordered due to questions regarding new versus baseline cognitive deficits.    Pt scored a 24 out of 30 on the MoCA standardized cognitive assessment (n>/= 26) which SLP suspects to be his baseline due to psychiatric history and prolonged substance abuse.  Pt reports that he is at baseline for cognition.  No further ST needs are warranted at this time.    Skilled Therapeutic Interventions          Cognitive-linguistic evaluation completed with results and recommendations reviewed with patient.     SLP Assessment  Patient does  not need any further Speech Lanaguage Pathology Services    Recommendations  Follow up Recommendations: None Equipment Recommended: None recommended by SLP           Pain Pain Assessment Pain Assessment: No/denies pain  Prior Functioning Cognitive/Linguistic Baseline: Baseline deficits Baseline deficit details: suspected due to history, pt is poor historian  Type of Home: House  Lives With: Alone Available Help at Discharge: Family;Available 24 hours/day Education: 2 years college   Function:  Eating Eating                 Cognition Comprehension Comprehension assist level: Follows basic conversation/direction with extra time/assistive device  Expression   Expression assist level: Expresses basic needs/ideas: With extra time/assistive device  Social Interaction Social Interaction assist level: Interacts appropriately 90% of the time - Needs monitoring or encouragement for participation or  interaction.  Problem Solving Problem solving assist level: Solves basic 75 - 89% of the time/requires cueing 10 - 24% of the time  Memory Memory assist level: Recognizes or recalls 75 - 89% of the time/requires cueing 10 - 24% of the time    Refer to Care Plan for Long Term Goals  Recommendations for other services: Neuropsych  Discharge Criteria: Patient will be discharged from SLP if patient refuses treatment 3 consecutive times without medical reason, if treatment goals not met, if there is a change in medical status, if patient makes no progress towards goals or if patient is discharged from hospital.  The above assessment, treatment plan, treatment alternatives and goals were discussed and mutually agreed upon: by patient  Emilio Math 12/05/2015, 3:12 PM

## 2015-12-06 ENCOUNTER — Inpatient Hospital Stay (HOSPITAL_COMMUNITY): Payer: Medicare Other | Admitting: Occupational Therapy

## 2015-12-06 ENCOUNTER — Inpatient Hospital Stay (HOSPITAL_COMMUNITY): Payer: Medicare Other | Admitting: Physical Therapy

## 2015-12-06 LAB — GLUCOSE, CAPILLARY
GLUCOSE-CAPILLARY: 95 mg/dL (ref 65–99)
Glucose-Capillary: 86 mg/dL (ref 65–99)
Glucose-Capillary: 82 mg/dL (ref 65–99)
Glucose-Capillary: 103 mg/dL — ABNORMAL HIGH (ref 65–99)

## 2015-12-06 NOTE — Progress Notes (Addendum)
Physical Therapy Session Note  Patient Details  Name: Shawn [name] MRN: [id number] Date of Birth: 1950-09-15  Today's Date: 12/06/2015 PT Individual Time: 1000-1043 PT Individual Time Calculation (min): 43 min   Short Term Goals: Week 1:  PT Short Term Goal 1 (Week 1): Pt will perform stand pivot transfer with S PT Short Term Goal 2 (Week 1): Pt will perform bed mobility with S PT Short Term Goal 3 (Week 1): Pt will perform gait with LRAD x100' with CGA and LRAD PT Short Term Goal 4 (Week 1): Pt will initiate stair training  Skilled Therapeutic Interventions/Progress Updates:    Pt received in bed reporting 8/10 abdominal pain, RN notified (later administered pain medication during session) & pt agreeable to PT. Pt transferred supine>sit with mod I & use of bed features. Pt ambulated room>gym 160 ft with RW & supervision. Pt with forward trunk lean & verbal cuing to ambulate within base of RW and for upright posture but pt with poor carryover. Pt also tends to look at ground. Pt impulsive & agitated throughout session; pt with poor safety awareness. Pt negotiated steps (6 inches + 3 inches in height) with B railings & supervision A. Pt ascended stairs with reciprocal pattern & descended stairs with step-to pattern. Demonstrated decreased step height and difficulty clearing foot on 2 occasions. Pt noted dizziness after stair negotiation & BP = 107/55 mmHg.  Pt also reported BLE pain after stair activity & reported MD told him earlier this was probably arthritis pain. Pt very frustrated over being bothered multiple times this AM. PT thoroughly educated pt on therapy schedule and benefits of participating in PT. Encouraged pt to continue to challenge himself during therapy sessions to progress towards independence with functional mobility. Pt agreeable to car transfer & completed task 2 times; pt attempted to step into car & had difficulty stepping out of car, reporting his "legs don't bend like they  used to". PT educated pt on sequencing to sit on seat of car then turn & transfer BLE in/out but pt reported this was not easier. Pt self propelled w/c from ortho gym>room ~200 ft with supervision. Pt would repeatedly let go of drive wheels and not pay attention to surrounding, thus requiring cuing from PT to avoid obstacles. Pt reported need to go back to bed; completed stand pivot w/c>bed with supervision & transferred into bed with Mod I. At end of session pt left in bed with all needs within reach & NT present.   Therapy Documentation Precautions:  Precautions Precautions: Fall Restrictions Weight Bearing Restrictions: No General: PT Amount of Missed Time (min): 32 Minutes PT Missed Treatment Reason: Patient unwilling to participate Vital Signs: Therapy Vitals BP: (!) 107/55 mmHg Patient Position (if appropriate): Sitting Pain: Pain Assessment Pain Assessment: 0-10 Pain Score: 8  Pain Location: Abdomen Pain Orientation: Mid Pain Descriptors / Indicators: Stabbing Pain Intervention(s): RN made aware;Repositioned;Ambulation/increased activity   See Function Navigator for Current Functional Status.   Therapy/Group: Individual Therapy  [name] 12/06/2015, 10:49 AM

## 2015-12-06 NOTE — Progress Notes (Signed)
Social Work Elease Hashimoto, LCSW Social Worker Signed  Patient Care Conference 12/06/2015 12:14 PM    Expand All Collapse All   Inpatient RehabilitationTeam Conference and Plan of Care Update Date: 12/06/2015   Time: 11:00 AM     Patient Name: Shawn [name]       Medical Record Number: [id number]  Date of Birth: Oct 03, 1950 Sex: Male         Room/Bed: 4W11C/4W11C-01 Payor Info: Payor: MEDICARE / Plan: MEDICARE PART A AND B / Product Type: *No Product type* /    Admitting Diagnosis: DEBILITY   Admit Date/Time:  11/29/2015  3:01 PM Admission Comments: No comment available   Primary Diagnosis:  <principal problem not specified> Principal Problem: <principal problem not specified>    Patient Active Problem List     Diagnosis  Date Noted   .  Debility  11/29/2015   .  Debilitated  11/29/2015   .  Paroxysmal atrial fibrillation (HCC)     .  Acute on chronic systolic heart failure ([place])     .  Adrenal insufficiency ([place])     .  Alcohol abuse     .  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  Polysubstance abuse     .  Bipolar affective disorder in remission ([place])     .  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  AKI (acute kidney injury) ([place])     .  Dysphagia     .  Tachycardia     .  Tachypnea     .  Hypokalemia     .  Leukocytosis     .  Acute blood loss anemia     .  Acute respiratory failure ([place])     .  Persistent atrial fibrillation ([place])     .  Acute on chronic combined systolic and diastolic CHF (congestive heart failure) ([place])     .  Acute respiratory failure with hypoxemia ([place])     .  Cardiomyopathy, ischemic  11/19/2015   .  Complete heart block ([place])  11/18/2015   .  Malnutrition of moderate degree  11/18/2015   .  Pressure ulcer  11/17/2015   .  Septic shock ([place])  11/17/2015   .  Pneumatosis coli  11/17/2015   .  Gastroenteritis due to norovirus  11/25/2013   .  Other pancytopenia ([place])  11/25/2013   .  Dehydration  11/20/2013   .  MDD (major depressive disorder)  ([place])  11/17/2013   .  Depression  11/17/2013   .  Fall  11/16/2013   .  Bipolar affective disorder, current episode mixed ([place])  04/12/2013   .  Alcohol abuse with intoxication ([place])  04/12/2013   .  Alcohol dependence ([place])  04/12/2013   .  Bipolar I disorder, most recent episode depressed ([place])  04/12/2013   .  Constipation  08/08/2012   .  Hip fracture, left ([place])  08/06/2012   .  Traumatic hematoma of buttock  08/06/2012   .  ETOH abuse  08/06/2012   .  H/O: substance abuse  08/06/2012   .  Tobacco abuse  08/06/2012   .  History of hernia repair  08/06/2012   .  Hip fracture, intertrochanteric ([place])  08/06/2012   .  Bipolar disorder, current episode mixed, mild ([place])  02/04/2012   .  Health maintenance examination  10/24/2011   .  Hypoxia  10/24/2011   .  Prostate cancer screening  09/19/2011   .  Tobacco dependence  09/08/2011   .  Erectile dysfunction  08/06/2011   .  Osteoarthritis of left knee  08/06/2011   .  Elevated blood pressure reading without diagnosis of hypertension  08/06/2011   .  History of depression  08/06/2011     Expected Discharge Date:    Team Members Present: Physician leading conference: Dr. Alysia Penna PT Present: Kem Parkinson, PT OT Present: Simonne Come, OT SLP Present: Windell Moulding, SLP PPS Coordinator present : Daiva Nakayama, RN, CRRN        Current Status/Progress  Goal  Weekly Team Focus   Medical     nausea improved today, pain overall improving   Home with family, maintain wound healing   wean oxycodone   Bowel/Bladder     Incontinent at times of B/B.  LBM 12/05/15.   Min assist with bowel and bladder.    Continue to monitor.    Swallow/Nutrition/ Hydration       na         ADL's     grossly min assist overall, limited participation due to pain and decreased motivation  supervision setup/cueing  activity tolerance, transfers, ADL retraining, pt/family education   Mobility     S/min guard gait with RW limited distances, poor  endurance and limited participation d/t nausea/dizziness  mod I transfers, S gait with RW and stairs   tolerating therapy, endurance, transfers and gait training    Communication       na         Safety/Cognition/ Behavioral Observations    Min assist with transfers/ history of anxiety and drug abuse    Supervision / decreased anxiety   Continue to monitor for unsafe behavior    Pain     Complains of pain to abdomen, gums/teeth   Pain less than or equal to 2 on 1-10 scale   continue to monitor for pain.    Skin     Surgical wound/midline abdominal incision:  Dressing change BID with wet to dry packing and dry dressing to cover   No skin breakdown/ No wound infection during admission   Continue to monitor skin for changes q shift.       *See Care Plan and progress notes for long and short-term goals.    Barriers to Discharge:  Son has MS but is able to drive, pt states he may stay with mother     Possible Resolutions to Barriers:   identified caregiver     Discharge Planning/Teaching Needs:   Return home with son who can be there but has health issues. Sister's home is not an option for pt.       Team Discussion:    Goals-supervision, has good and bad days with pain dictating his level. Working on endurance, activity tolerance and anxiety. Self limits himself at times due to motivation to the task at hand. BID dressing changes to abdominal wound. Stomach pain limits him. Pt feels his care is too much for son and wants to pursue NHP   Revisions to Treatment Plan:    Pt prefers NHP    Continued Need for Acute Rehabilitation Level of Care: The patient requires daily medical management by a physician with specialized training in physical medicine and rehabilitation for the following conditions: Daily direction of a multidisciplinary physical rehabilitation program to ensure safe treatment while eliciting the highest outcome that is of practical value to the patient.: Yes Daily  medical  management of patient stability for increased activity during participation in an intensive rehabilitation regime.: Yes Daily analysis of laboratory values and/or radiology reports with any subsequent need for medication adjustment of medical intervention for : Mood/behavior problems;Neurological problems;Other  Elease Hashimoto 12/06/2015, 12:14 PM                  Patient ID: Shawn [name], male   DOB: 02/27/1951, 65 y.o.   MRN: [id number]

## 2015-12-06 NOTE — Patient Care Conference (Signed)
Inpatient RehabilitationTeam Conference and Plan of Care Update Date: 12/06/2015   Time: 11:00 AM    Patient Name: Shawn [name]      Medical Record Number: [id number]  Date of Birth: 06/20/1951 Sex: Male         Room/Bed: 4W11C/4W11C-01 Payor Info: Payor: MEDICARE / Plan: MEDICARE PART A AND B / Product Type: *No Product type* /    Admitting Diagnosis: DEBILITY  Admit Date/Time:  11/29/2015  3:01 PM Admission Comments: No comment available   Primary Diagnosis:  <principal problem not specified> Principal Problem: <principal problem not specified>  Patient Active Problem List   Diagnosis Date Noted  . Debility 11/29/2015  . Debilitated 11/29/2015  . Paroxysmal atrial fibrillation (HCC)   . Acute on chronic systolic heart failure ([place])   . Adrenal insufficiency ([place])   . Alcohol abuse   . 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 Polysubstance abuse   . Bipolar affective disorder in remission ([place])   . 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 AKI (acute kidney injury) ([place])   . Dysphagia   . Tachycardia   . Tachypnea   . Hypokalemia   . Leukocytosis   . Acute blood loss anemia   . Acute respiratory failure ([place])   . Persistent atrial fibrillation ([place])   . Acute on chronic combined systolic and diastolic CHF (congestive heart failure) ([place])   . Acute respiratory failure with hypoxemia ([place])   . Cardiomyopathy, ischemic 11/19/2015  . Complete heart block ([place]) 11/18/2015  . Malnutrition of moderate degree 11/18/2015  . Pressure ulcer 11/17/2015  . Septic shock ([place]) 11/17/2015  . Pneumatosis coli 11/17/2015  . Gastroenteritis due to norovirus 11/25/2013  . Other pancytopenia ([place]) 11/25/2013  . Dehydration 11/20/2013  . MDD (major depressive disorder) ([place]) 11/17/2013  . Depression 11/17/2013  . Fall 11/16/2013  . Bipolar affective disorder, current episode mixed ([place]) 04/12/2013  . Alcohol abuse with intoxication ([place]) 04/12/2013  . Alcohol dependence ([place]) 04/12/2013  .  Bipolar I disorder, most recent episode depressed ([place]) 04/12/2013  . Constipation 08/08/2012  . Hip fracture, left ([place]) 08/06/2012  . Traumatic hematoma of buttock 08/06/2012  . ETOH abuse 08/06/2012  . H/O: substance abuse 08/06/2012  . Tobacco abuse 08/06/2012  . History of hernia repair 08/06/2012  . Hip fracture, intertrochanteric ([place]) 08/06/2012  . Bipolar disorder, current episode mixed, mild ([place]) 02/04/2012  . Health maintenance examination 10/24/2011  . Hypoxia 10/24/2011  . Prostate cancer screening 09/19/2011  . Tobacco dependence 09/08/2011  . Erectile dysfunction 08/06/2011  . Osteoarthritis of left knee 08/06/2011  . Elevated blood pressure reading without diagnosis of hypertension 08/06/2011  . History of depression 08/06/2011    Expected Discharge Date:    Team Members Present: Physician leading conference: Dr. Alysia Penna PT Present: Kem Parkinson, PT OT Present: Simonne Come, OT SLP Present: Windell Moulding, SLP PPS Coordinator present : Daiva Nakayama, RN, CRRN     Current Status/Progress Goal Weekly Team Focus  Medical   nausea improved today, pain overall improving  Home with family, maintain wound healing  wean oxycodone   Bowel/Bladder   Incontinent at times of B/B.  LBM 12/05/15.  Min assist with bowel and bladder.   Continue to monitor.    Swallow/Nutrition/ Hydration     na        ADL's   grossly min assist overall, limited participation due to pain and decreased motivation  supervision setup/cueing  activity tolerance, transfers, ADL retraining, pt/family education  Mobility   S/min guard gait with RW limited distances, poor endurance and limited participation d/t nausea/dizziness  mod I transfers, S gait with RW and stairs  tolerating therapy, endurance, transfers and gait training   Communication     na        Safety/Cognition/ Behavioral Observations  Min assist with transfers/ history of anxiety and drug abuse   Supervision /  decreased anxiety   Continue to monitor for unsafe behavior   Pain   Complains of pain to abdomen, gums/teeth  Pain less than or equal to 2 on 1-10 scale  continue to monitor for pain.    Skin   Surgical wound/midline abdominal incision:  Dressing change BID with wet to dry packing and dry dressing to cover  No skin breakdown/ No wound infection during admission  Continue to monitor skin for changes q shift.       *See Care Plan and progress notes for long and short-term goals.  Barriers to Discharge: Son has MS but is able to drive, pt states he may stay with mother    Possible Resolutions to Barriers:  identified caregiver    Discharge Planning/Teaching Needs:  Return home with son who can be there but has health issues. Sister's home is not an option for pt.      Team Discussion:  Goals-supervision, has good and bad days with pain dictating his level. Working on endurance, activity tolerance and anxiety. Self limits himself at times due to motivation to the task at hand. BID dressing changes to abdominal wound. Stomach pain limits him. Pt feels his care is too much for son and wants to pursue NHP  Revisions to Treatment Plan:  Pt prefers NHP   Continued Need for Acute Rehabilitation Level of Care: The patient requires daily medical management by a physician with specialized training in physical medicine and rehabilitation for the following conditions: Daily direction of a multidisciplinary physical rehabilitation program to ensure safe treatment while eliciting the highest outcome that is of practical value to the patient.: Yes Daily medical management of patient stability for increased activity during participation in an intensive rehabilitation regime.: Yes Daily analysis of laboratory values and/or radiology reports with any subsequent need for medication adjustment of medical intervention for : Mood/behavior problems;Neurological problems;Other  Meesha Sek, Gardiner Rhyme 12/06/2015, 12:14  PM

## 2015-12-06 NOTE — Progress Notes (Signed)
Occupational Therapy Session Note  Patient Details  Name: Shawn [name] MRN: [id number] Date of Birth: Aug 22, 1951  Today's Date: 12/06/2015 OT Individual Time: [id number] and 1135-1200 OT Individual Time Calculation (min): 40 min and 25 min   Short Term Goals: Week 1:  OT Short Term Goal 1 (Week 1): Pt will complete bathing at sit > stand level with min assist OT Short Term Goal 2 (Week 1): Pt will complete UB bathing with min assist OT Short Term Goal 3 (Week 1): Pt will complete LB dressing with min assist at sit > stand level OT Short Term Goal 4 (Week 1): Pt will complete toilet transfers with min assist with LRAD OT Short Term Goal 5 (Week 1): Pt will complete 1 grooming task in standing with min assist  Skilled Therapeutic Interventions/Progress Updates:    1) Treatment session with focus on functional transfers and self-care tasks of bathing and dressing.  Pt in better spirits this AM, although upset that his son didn't bring clothes yesterday as planned.  Engaged in bathing at sit > stand level at sink with setup assist for items.  Issued pt shirt and pants from rehab supplies with pt able to don each with setup and increased time with donning pants secondary to abdominal pain with bending.  Pt returned to EOB stand pivot with supervision and engaged in BUE strengthening exercises with theraband.  Pt returned to semi-reclined in bed and session terminated by patient.  2) Treatment session with focus on d/c planning and discussion of person and therapy goals.  Pt reports most likely d/c to SNF due to need for care of his incision and unsure his son can provide the necessary assist.  Declined OOB activity secondary to pain and busy morning of therapies. Pt reports feeling pleased with progress towards therapy goals and wishing to d/c soon.  Engaged in bed mobility to increase positioning.  Left with necessary items in reach and plans to call son after lunch to bring his glasses and other  items.  Therapy Documentation Precautions:  Precautions Precautions: Fall Restrictions Weight Bearing Restrictions: No Vital Signs: Therapy Vitals BP: (!) 107/55 mmHg Patient Position (if appropriate): Sitting Pain: Pain Assessment Pain Assessment: 0-10 Pain Score: 8  Pain Type: Surgical pain Pain Location: Abdomen Pain Orientation: Mid Pain Descriptors / Indicators: Stabbing Pain Frequency: Constant Pain Onset: On-going Patients Stated Pain Goal: 4 Pain Intervention(s): RN made aware;Repositioned;Ambulation/increased activity  See Function Navigator for Current Functional Status.   Therapy/Group: Individual Therapy  Simonne Come 12/06/2015, 11:15 AM

## 2015-12-06 NOTE — Progress Notes (Signed)
Subjective/Complaints: No nausea or vomiting today. No significant pain issues. Has some knee pain on the right side. Mainly on the medial aspect. No trauma to that area.  ROS-postsurgical abdominal pain, having bowel movements, condom cath for bladder, no chest pain or shortness of breath no nausea or vomiting  Objective: Vital Signs: Blood pressure 121/61, pulse 62, temperature 98.4 F (36.9 C), temperature source Oral, resp. rate 18, height '5\' 11"'$  (1.803 m), weight 72.8 kg (160 lb 7.9 oz), SpO2 98 %. No results found. Results for orders placed or performed during the hospital encounter of 11/29/15 (from the past 72 hour(s))  Glucose, capillary     Status: Abnormal   Collection Time: 12/03/15 11:39 AM  Result Value Ref Range   Glucose-Capillary 151 (H) 65 - 99 mg/dL  Glucose, capillary     Status: Abnormal   Collection Time: 12/03/15  4:21 PM  Result Value Ref Range   Glucose-Capillary 123 (H) 65 - 99 mg/dL  Glucose, capillary     Status: None   Collection Time: 12/03/15  9:05 PM  Result Value Ref Range   Glucose-Capillary 89 65 - 99 mg/dL  Glucose, capillary     Status: None   Collection Time: 12/04/15  5:50 AM  Result Value Ref Range   Glucose-Capillary 83 65 - 99 mg/dL  Glucose, capillary     Status: Abnormal   Collection Time: 12/04/15 11:16 AM  Result Value Ref Range   Glucose-Capillary 116 (H) 65 - 99 mg/dL  Glucose, capillary     Status: Abnormal   Collection Time: 12/04/15  4:10 PM  Result Value Ref Range   Glucose-Capillary 122 (H) 65 - 99 mg/dL   Comment 1 Notify RN   Glucose, capillary     Status: Abnormal   Collection Time: 12/04/15  9:08 PM  Result Value Ref Range   Glucose-Capillary 117 (H) 65 - 99 mg/dL  Glucose, capillary     Status: Abnormal   Collection Time: 12/05/15  6:53 AM  Result Value Ref Range   Glucose-Capillary 123 (H) 65 - 99 mg/dL  CBC with Differential/Platelet     Status: Abnormal   Collection Time: 12/05/15 10:46 AM  Result Value Ref  Range   WBC 8.3 4.0 - 10.5 K/uL   RBC 3.64 (L) 4.22 - 5.81 MIL/uL   Hemoglobin 10.8 (L) 13.0 - 17.0 g/dL   HCT 33.6 (L) 39.0 - 52.0 %   MCV 92.3 78.0 - 100.0 fL   MCH 29.7 26.0 - 34.0 pg   MCHC 32.1 30.0 - 36.0 g/dL   RDW 15.6 (H) 11.5 - 15.5 %   Platelets 626 (H) 150 - 400 K/uL   Neutrophils Relative % 78 %   Lymphocytes Relative 9 %   Monocytes Relative 9 %   Eosinophils Relative 3 %   Basophils Relative 1 %   Neutro Abs 6.5 1.7 - 7.7 K/uL   Lymphs Abs 0.7 0.7 - 4.0 K/uL   Monocytes Absolute 0.7 0.1 - 1.0 K/uL   Eosinophils Absolute 0.3 0.0 - 0.7 K/uL   Basophils Absolute 0.1 0.0 - 0.1 K/uL   WBC Morphology TOXIC GRANULATION   Basic metabolic panel     Status: Abnormal   Collection Time: 12/05/15 10:46 AM  Result Value Ref Range   Sodium 140 135 - 145 mmol/L   Potassium 3.7 3.5 - 5.1 mmol/L   Chloride 106 101 - 111 mmol/L   CO2 25 22 - 32 mmol/L   Glucose, Bld 113 (H)  65 - 99 mg/dL   BUN 8 6 - 20 mg/dL   Creatinine, Ser 1.47 (H) 0.61 - 1.24 mg/dL   Calcium 8.5 (L) 8.9 - 10.3 mg/dL   GFR calc non Af Amer 48 (L) >60 mL/min   GFR calc Af Amer 55 (L) >60 mL/min    Comment: (NOTE) The eGFR has been calculated using the CKD EPI equation. This calculation has not been validated in all clinical situations. eGFR's persistently <60 mL/min signify possible Chronic Kidney Disease.    Anion gap 9 5 - 15  Glucose, capillary     Status: Abnormal   Collection Time: 12/05/15 11:21 AM  Result Value Ref Range   Glucose-Capillary 107 (H) 65 - 99 mg/dL   Comment 1 Notify RN   Glucose, capillary     Status: Abnormal   Collection Time: 12/05/15  4:20 PM  Result Value Ref Range   Glucose-Capillary 116 (H) 65 - 99 mg/dL   Comment 1 Notify RN   Glucose, capillary     Status: Abnormal   Collection Time: 12/05/15  8:45 PM  Result Value Ref Range   Glucose-Capillary 121 (H) 65 - 99 mg/dL  Glucose, capillary     Status: None   Collection Time: 12/06/15  6:22 AM  Result Value Ref Range    Glucose-Capillary 86 65 - 99 mg/dL      General: No acute distress HEENT: Poor dentition, granulation tissue at corner of oral stoma bilaterally Mood and affect are appropriate Heart: Regular rate and rhythm no rubs murmurs or extra sounds Lungs: Clear to auscultation, breathing unlabored, no rales or wheezes Abdomen: Positive bowel sounds, soft nontender to palpation, nondistended,midline abdominal incision packed with wet to dry gauze, good granulation tissue, no purulent drainage, no surrounding erythema or maceration. Extremities: No clubbing, cyanosis, or edema Skin: No evidence of breakdown, no evidence of rash Neurologic: Cranial nerves II through XII intact, motor strength is 4/5 in bilateral deltoid, bicep, tricep, grip, hip flexor, knee extensors, ankle dorsiflexor and plantar flexor Sensory exam normal sensation to light touch and proprioception in bilateral upper and lower extremities Cerebellar exam normal finger to nose to finger as well as heel to shin in bilateral upper and lower extremities Musculoskeletal: Full range of motion in all 4 extremities. No joint swelling   Assessment/Plan: 1. 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 Physiatrist is providing close team supervision and 24 hour management of active medical problems listed below. Physiatrist and rehab team continue to assess barriers to discharge/monitor patient progress toward functional and medical goals. FIM: Function - Bathing Position: Wheelchair/chair at sink Body parts bathed by patient: Right arm, Left arm, Chest, Front perineal area, Buttocks, Right upper leg, Left upper leg Body parts bathed by helper: Back Bathing not applicable: Abdomen, Right lower leg, Left lower leg Assist Level: Set up Set up : To obtain  items  Function- Upper Body Dressing/Undressing Upper body dressing/undressing activity did not occur: Refused (patient complaining of pain, did not want to get OOB) What is the patient wearing?: Pull over shirt/dress Pull over shirt/dress - Perfomed by patient: Thread/unthread right sleeve, Thread/unthread left sleeve, Put head through opening, Pull shirt over trunk Assist Level: Set up Set up : To obtain clothing/put away Function - Lower Body Dressing/Undressing Lower body dressing/undressing activity did not occur: Refused What is the patient wearing?: Pants Position: Wheelchair/chair at sink  Pants- Performed by patient: Thread/unthread right pants leg, Thread/unthread left pants leg, Pull pants up/down Non-skid slipper socks- Performed by helper: Don/doff right sock, Don/doff left sock Assist for footwear: Dependant (per report non-slip socks for safety, applied by staff) Assist for lower body dressing: Set up  Function - Toileting Toileting activity did not occur: No continent bowel/bladder event Toileting steps completed by patient: Adjust clothing prior to toileting, Adjust clothing after toileting Toileting steps completed by helper: Performs perineal hygiene Toileting Assistive Devices: Grab bar or rail Assist level: Touching or steadying assistance (Pt.75%)  Function - Air cabin crew transfer assistive device: Elevated toilet seat/BSC over toilet, Grab bar Assist level to toilet: Touching or steadying assistance (Pt > 75%) Assist level from toilet: Touching or steadying assistance (Pt > 75%) Assist level to bedside commode (at bedside): Touching or steadying assistance (Pt > 75%) Assist level from bedside commode (at bedside): Touching or steadying assistance (Pt > 75%)  Function - Chair/bed transfer Chair/bed transfer method: Stand pivot Chair/bed transfer assist level: Supervision or verbal cues Chair/bed transfer assistive device: Armrests Chair/bed transfer  details: Verbal cues for technique  Function - Locomotion: Wheelchair Will patient use wheelchair at discharge?:  (TBD) Type: Manual Max wheelchair distance: 150 Assist Level: Supervision or verbal cues Assist Level: Supervision or verbal cues Assist Level: Supervision or verbal cues Turns around,maneuvers to table,bed, and toilet,negotiates 3% grade,maneuvers on rugs and over doorsills: No Function - Locomotion: Ambulation Assistive device: Walker-rolling Max distance: 140 Assist level: Supervision or verbal cues Assist level: Supervision or verbal cues Walk 50 feet with 2 turns activity did not occur: Safety/medical concerns Assist level: Supervision or verbal cues Walk 150 feet activity did not occur: Safety/medical concerns Walk 10 feet on uneven surfaces activity did not occur: Safety/medical concerns Assist level: Touching or steadying assistance (Pt > 75%)  Function - Comprehension Comprehension: Auditory Comprehension assist level: Follows basic conversation/direction with no assist  Function - Expression Expression: Verbal Expression assist level: Expresses basic needs/ideas: With no assist  Function - Social Interaction Social Interaction assist level: Interacts appropriately with others - No medications needed.  Function - Problem Solving Problem solving assist level: Solves basic problems with no assist  Function - Memory Memory assist level: Recognizes or recalls 90% of the time/requires cueing < 10% of the time Patient normally able to recall (first 3 days only): Current season, Location of own room, Staff names and faces, That he or she is in a hospital  Medical Problem List and Plan: 1.  Debilitation secondary to respiratory failure/small bowel repair/septic shock Burman Blacksmith pneumonia /multiple medical-Team conference today please see physician documentation under team conference tab, met with team face-to-face to discuss problems,progress, and goals.  Formulized individual treatment plan based on medical history, underlying problem and comorbidities. 2.  DVT Prophylaxis/Anticoagulation: Subcutaneous heparin. Monitor for any bleeding episode 3. Pain Management: Oxycodone as needed, I would not increase based on history of alcohol abuse 4. Acute blood loss anemia. Follow-up CBC, Hgb 9.2 5. Neuropsych: This patient is capable of making decisions on his own behalf.would like to have neuropsychology look more in detail at his decision-making capacity as well as evidence of alcohol-induced encephalopathy 6. Skin/Wound Care: Routine skin checks 7. Fluids/Electrolytes/Nutrition: Routine I&O with follow-up chemistries 8. A. fib with RVR/complete heart block. Continue amiodarone.Cardiac rate controlled. 9. Acute on chronic systolic congestive heart failure. Monitor for any signs of fluid overload. Continue Lasix 40 mg daily as directed, no signs of peripheral edema 10. Adrenal insufficiency.completed Solu-Cortef 11. Mood/bipolar  disorder.  Lamictal 200 mg daily at bedtime as well as Seroquel 50 mg daily at bedtime and titrate up the home dose of 300 mg as needed. 12. Decreased nutritional storage. Advance diet as tolerated.intake 10-50% meals,  13. Alcohol polysubstance abuse. Counseling. Monitor for any signs of withdrawal, neuropsych evaluation 14. Hypokalemia. Continue potassium supplement. Follow-up chemistries, K 4.7 15. Oral ulcer, both on mucosal and oral commissure,angular chelitis, will start antifungal cream 16. Hypoalbuminemia Protostat, dietary consult 17. Poor memory, this may be evidence of postoperative versus alcoholic encephalopathy, will ask speech therapy to evaluate LOS (Days) 7 A FACE TO FACE EVALUATION WAS PERFORMED  KIRSTEINS,ANDREW E 12/06/2015, 9:47 AM

## 2015-12-06 NOTE — Progress Notes (Signed)
Patient ID: Shawn [name], male   DOB: [birth year], 65 y.o.   MRN: [id number]     [facility]      [address] [place], [place] [address]    Phone: [phone] FAX: [phone]     Subjective: bm yesterday.  C/o pain, appears comfortable and no objective signs of distress. Labs reviewed, no wbc.  Objective:  Vital signs:  Filed Vitals:   12/04/15 1511 12/05/15 0517 12/06/15 0624 12/06/15 1048  BP: 94/49 104/63 121/61 107/55  Pulse: 77 68 62   Temp:  98.7 F (37.1 C) 98.4 F (36.9 C)   TempSrc:  Oral Oral   Resp:  18 18   Height:      Weight:  75 kg (165 lb 5.5 oz) 72.8 kg (160 lb 7.9 oz)   SpO2:  95% 98%     Last BM Date: 12/05/15  Intake/Output   Yesterday:  04/04 0701 - 04/05 0700 In: 480 [P.O.:480] Out: 775 [Urine:775] This shift:  Total I/O In: 120 [P.O.:120] Out: -   Physical Exam: General: Pt awake/alert/oriented and in no acute distress  Abdomen: Soft.  Nondistended.  Non tender.  Incision c/d/i.   No evidence of peritonitis.  No incarcerated hernias.    Problem List:   Active Problems:   Debility   Paroxysmal atrial fibrillation (HCC)   Acute on chronic systolic heart failure (HCC)   Adrenal insufficiency (HCC)   Alcohol abuse   Debilitated    Results:   Labs: Results for orders placed or performed during the hospital encounter of 11/29/15 (from the past 48 hour(s))  Glucose, capillary     Status: Abnormal   Collection Time: 12/04/15 11:16 AM  Result Value Ref Range   Glucose-Capillary 116 (H) 65 - 99 mg/dL  Glucose, capillary     Status: Abnormal   Collection Time: 12/04/15  4:10 PM  Result Value Ref Range   Glucose-Capillary 122 (H) 65 - 99 mg/dL   Comment 1 Notify RN   Glucose, capillary     Status: Abnormal   Collection Time: 12/04/15  9:08 PM  Result Value Ref Range   Glucose-Capillary 117 (H) 65 - 99 mg/dL  Glucose, capillary     Status: Abnormal   Collection Time: 12/05/15   6:53 AM  Result Value Ref Range   Glucose-Capillary 123 (H) 65 - 99 mg/dL  CBC with Differential/Platelet     Status: Abnormal   Collection Time: 12/05/15 10:46 AM  Result Value Ref Range   WBC 8.3 4.0 - 10.5 K/uL   RBC 3.64 (L) 4.22 - 5.81 MIL/uL   Hemoglobin 10.8 (L) 13.0 - 17.0 g/dL   HCT 33.6 (L) 39.0 - 52.0 %   MCV 92.3 78.0 - 100.0 fL   MCH 29.7 26.0 - 34.0 pg   MCHC 32.1 30.0 - 36.0 g/dL   RDW 15.6 (H) 11.5 - 15.5 %   Platelets 626 (H) 150 - 400 K/uL   Neutrophils Relative % 78 %   Lymphocytes Relative 9 %   Monocytes Relative 9 %   Eosinophils Relative 3 %   Basophils Relative 1 %   Neutro Abs 6.5 1.7 - 7.7 K/uL   Lymphs Abs 0.7 0.7 - 4.0 K/uL   Monocytes Absolute 0.7 0.1 - 1.0 K/uL   Eosinophils Absolute 0.3 0.0 - 0.7 K/uL   Basophils Absolute 0.1 0.0 - 0.1 K/uL   WBC Morphology TOXIC GRANULATION   Basic metabolic panel  Status: Abnormal   Collection Time: 12/05/15 10:46 AM  Result Value Ref Range   Sodium 140 135 - 145 mmol/L   Potassium 3.7 3.5 - 5.1 mmol/L   Chloride 106 101 - 111 mmol/L   CO2 25 22 - 32 mmol/L   Glucose, Bld 113 (H) 65 - 99 mg/dL   BUN 8 6 - 20 mg/dL   Creatinine, Ser 1.47 (H) 0.61 - 1.24 mg/dL   Calcium 8.5 (L) 8.9 - 10.3 mg/dL   GFR calc non Af Amer 48 (L) >60 mL/min   GFR calc Af Amer 55 (L) >60 mL/min    Comment: (NOTE) The eGFR has been calculated using the CKD EPI equation. This calculation has not been validated in all clinical situations. eGFR's persistently <60 mL/min signify possible Chronic Kidney Disease.    Anion gap 9 5 - 15  Glucose, capillary     Status: Abnormal   Collection Time: 12/05/15 11:21 AM  Result Value Ref Range   Glucose-Capillary 107 (H) 65 - 99 mg/dL   Comment 1 Notify RN   Glucose, capillary     Status: Abnormal   Collection Time: 12/05/15  4:20 PM  Result Value Ref Range   Glucose-Capillary 116 (H) 65 - 99 mg/dL   Comment 1 Notify RN   Glucose, capillary     Status: Abnormal   Collection Time:  12/05/15  8:45 PM  Result Value Ref Range   Glucose-Capillary 121 (H) 65 - 99 mg/dL  Glucose, capillary     Status: None   Collection Time: 12/06/15  6:22 AM  Result Value Ref Range   Glucose-Capillary 86 65 - 99 mg/dL    Imaging / Studies: No results found.  Medications / Allergies:  Scheduled Meds: . amiodarone  400 mg Oral BID  . antiseptic oral rinse  7 mL Mouth Rinse BID  . aspirin  81 mg Oral Daily  . docusate sodium  100 mg Oral BID  . enoxaparin (LOVENOX) injection  40 mg Subcutaneous Q24H  . folic acid  1 mg Oral Daily  . furosemide  40 mg Oral Daily  . insulin aspart  0-15 Units Subcutaneous TID WC  . lamoTRIgine  200 mg Oral QHS  . pantoprazole  40 mg Oral Daily  . polyethylene glycol  17 g Oral Daily  . QUEtiapine  50 mg Oral QHS  . sodium phosphate  1 enema Rectal Once   Continuous Infusions:  PRN Meds:.albuterol, magic mouthwash w/lidocaine, ondansetron **OR** ondansetron (ZOFRAN) IV, oxyCODONE-acetaminophen, sodium chloride, sorbitol, white petrolatum  Antibiotics: Anti-infectives    None         Assessment/Plan Exploratory laparotomy 11/17/15--Dr. Tsuei Exploratory laparotomy, SBR 11/19/15--Dr. Rosendo Gros Exploratory laparotomy, SB anastomosis and closure 11/21/15--Dr. Ninfa Linden  -no surgical issues.  Call with questions or concerns.     Erby Pian, [facility] Surgery Pager [phone]) For consults and floor pages call [phone]([id number]-4:[id number])  12/06/2015 11:04 AM

## 2015-12-06 NOTE — Progress Notes (Signed)
Physical Therapy Session Note  Patient Details  Name: Shawn [name] MRN: [id number] Date of Birth: November 25, 1950  Today's Date: 12/06/2015 PT Individual Time: 0800-0900 PT Individual Time Calculation (min): 60 min   Short Term Goals: Week 1:  PT Short Term Goal 1 (Week 1): Pt will perform stand pivot transfer with S PT Short Term Goal 2 (Week 1): Pt will perform bed mobility with S PT Short Term Goal 3 (Week 1): Pt will perform gait with LRAD x100' with CGA and LRAD PT Short Term Goal 4 (Week 1): Pt will initiate stair training  Skilled Therapeutic Interventions/Progress Updates:    Pt received seated in bed, c/o 8/10 abdominal pain and agreeable to treatment. Supine>sit mod I. Transfer bed>w/c with S. Changed gowns with setupA, and sit <>stand at sink with BUE support on sink and S. Gait to gym with RW and S x140'. Pt requests to perform nustep; x10 min with BUE/BLE on level 1 (decreased resistance due to pt c/o L knee pain) with average 47 steps/min. Ascent/descent of 3" stairs; 8 steps with close S and B handrails. Fatigued after completion of stairs. Supine and seated LE strengthening exercises 1 set 15 reps each with pt given packet of exercises for performance outside of regular therapy sessions and after d/c home. Limited due to L knee pain, declines any intervention stating he just wants to get in bed and get comfortable. W/c propulsion to room with S. Stand pivot to bed with S. Remained supine in bed with alarm intact and all needs within reach at completion of session.   Therapy Documentation Precautions:  Precautions Precautions: Fall Restrictions Weight Bearing Restrictions: No Pain: Pain Assessment Pain Assessment: 0-10 Pain Score: 8  Pain Type: Surgical pain Pain Location: Abdomen Pain Orientation: Mid Pain Descriptors / Indicators: Aching Pain Onset: On-going Patients Stated Pain Goal: 2 Pain Intervention(s): Medication (See eMAR)   See Function Navigator for Current  Functional Status.   Therapy/Group: Individual Therapy  Luberta Mutter 12/06/2015, 9:15 AM

## 2015-12-06 NOTE — Progress Notes (Signed)
Social Work Patient ID: Shawn [name], male   DOB: [birth year], 65 y.o.   MRN: [id number] Met with pt to discuss team conference goals of supervision level and target discharge date of 4/7. Pt is upset with his son since he was suppose to be here yesterday and bring his glasses. He feels if he can not even do this then he will not assist him at home and he has dressing changes. He would like to pursue NHP , he has been to one before and has no preference of which one he would like to go too. He is aware this worker will need to do a Passar level two due to his mental health diagnosis. He is agreeable to this option. This worker contacted son and he did answer and said he would be here today to give pt his clothes and Glasses. Will see when here.

## 2015-12-07 ENCOUNTER — Inpatient Hospital Stay (HOSPITAL_COMMUNITY): Payer: Self-pay | Admitting: Occupational Therapy

## 2015-12-07 ENCOUNTER — Inpatient Hospital Stay (HOSPITAL_COMMUNITY): Payer: Medicare Other | Admitting: Occupational Therapy

## 2015-12-07 ENCOUNTER — Inpatient Hospital Stay (HOSPITAL_COMMUNITY): Payer: Medicare Other | Admitting: Physical Therapy

## 2015-12-07 ENCOUNTER — Encounter (HOSPITAL_COMMUNITY): Payer: Self-pay

## 2015-12-07 LAB — GLUCOSE, CAPILLARY
GLUCOSE-CAPILLARY: 123 mg/dL — AB (ref 65–99)
GLUCOSE-CAPILLARY: 105 mg/dL — AB (ref 65–99)
Glucose-Capillary: 108 mg/dL — ABNORMAL HIGH (ref 65–99)
Glucose-Capillary: 123 mg/dL — ABNORMAL HIGH (ref 65–99)

## 2015-12-07 NOTE — Progress Notes (Signed)
Social Work Patient ID: Shawn [name], male   DOB: 12/07/1950, 65 y.o.   MRN: [id number] Working on passar level tow have faxed in information so can be determined. Will await approval number, pt not feeling well today and in pain. He is aware of the process since has been through this before in 2015.

## 2015-12-07 NOTE — Progress Notes (Signed)
Occupational Therapy Note  Patient Details  Name: Shawn [name] MRN: [id number] Date of Birth: 02-05-1951  Pt's plan of care adjusted to QD, therapy daily, after speaking with care team and discussed with PA as pt currently unable to tolerate current therapy schedule with OT and PT. Pt awaiting NHP due to decreased caregiver support upon d/c.   Simonne Come 12/07/2015, 1:13 PM

## 2015-12-07 NOTE — NC FL2 (Signed)
[place] LEVEL OF CARE SCREENING TOOL     IDENTIFICATION  Patient Name: Shawn [name] Birthdate: Sep 03, 1950 Sex: male Admission Date (Current Location): 11/29/2015  [facility] and [place] Number:  [employer] and Address:  [place]. [facility], [place] [address], [place], [place] [address]      Provider Number: [id number]  Attending Physician Name and Address:  Charlett Blake, MD  Relative Name and Phone Number:       Current Level of Care: Other (Comment) (rehab) Recommended Level of Care: [place] Prior Approval Number:    Date Approved/Denied:   PASRR Number:    Discharge Plan: SNF    Current Diagnoses: Patient Active Problem List   Diagnosis Date Noted  . Debility 11/29/2015  . Debilitated 11/29/2015  . Paroxysmal atrial fibrillation (HCC)   . Acute on chronic systolic heart failure ([place])   . Adrenal insufficiency ([place])   . Alcohol abuse   . Mesenteric ischemia ([place])   . Polysubstance abuse   . Bipolar affective disorder in remission ([place])   . History of ventilator dependency ([place])   . AKI (acute kidney injury) ([place])   . Dysphagia   . Tachycardia   . Tachypnea   . Hypokalemia   . Leukocytosis   . Acute blood loss anemia   . Acute respiratory failure ([place])   . Persistent atrial fibrillation ([place])   . Acute on chronic combined systolic and diastolic CHF (congestive heart failure) ([place])   . Acute respiratory failure with hypoxemia ([place])   . Cardiomyopathy, ischemic 11/19/2015  . Complete heart block ([place]) 11/18/2015  . Malnutrition of moderate degree 11/18/2015  . Pressure ulcer 11/17/2015  . Septic shock ([place]) 11/17/2015  . Pneumatosis coli 11/17/2015  . Gastroenteritis due to norovirus 11/25/2013  . Other pancytopenia ([place]) 11/25/2013  . Dehydration 11/20/2013  . MDD (major depressive disorder) ([place]) 11/17/2013  . Depression 11/17/2013  . Fall 11/16/2013  . Bipolar affective disorder, current  episode mixed ([place]) 04/12/2013  . Alcohol abuse with intoxication ([place]) 04/12/2013  . Alcohol dependence ([place]) 04/12/2013  . Bipolar I disorder, most recent episode depressed ([place]) 04/12/2013  . Constipation 08/08/2012  . Hip fracture, left ([place]) 08/06/2012  . Traumatic hematoma of buttock 08/06/2012  . ETOH abuse 08/06/2012  . H/O: substance abuse 08/06/2012  . Tobacco abuse 08/06/2012  . History of hernia repair 08/06/2012  . Hip fracture, intertrochanteric ([place]) 08/06/2012  . Bipolar disorder, current episode mixed, mild ([place]) 02/04/2012  . Health maintenance examination 10/24/2011  . Hypoxia 10/24/2011  . Prostate cancer screening 09/19/2011  . Tobacco dependence 09/08/2011  . Erectile dysfunction 08/06/2011  . Osteoarthritis of left knee 08/06/2011  . Elevated blood pressure reading without diagnosis of hypertension 08/06/2011  . History of depression 08/06/2011    Orientation RESPIRATION BLADDER Height & Weight     Self, Situation, Place  Normal Continent Weight: 163 lb 2.3 oz (74 kg) Height:  5\' 11"  (180.3 cm)  BEHAVIORAL SYMPTOMS/MOOD NEUROLOGICAL BOWEL NUTRITION STATUS      Continent Diet (regular)  AMBULATORY STATUS COMMUNICATION OF NEEDS Skin   Limited Assist Verbally Surgical wounds                       Personal Care Assistance Level of Assistance  Dressing, Bathing Bathing Assistance: Limited assistance   Dressing Assistance: Limited assistance     Functional Limitations Info  Sight Sight Info: Impaired  SPECIAL CARE FACTORS FREQUENCY  PT (By licensed PT), OT (By licensed OT)     PT Frequency: 5x week OT Frequency: 5x week            Contractures Contractures Info: Not present    Additional Factors Info  Code Status, Allergies   Allergies Info: codeine and depakote Psychotropic Info: seroquel         Current Medications (12/07/2015):  This is the current hospital active medication list Current Facility-Administered  Medications  Medication Dose Route Frequency Provider Last Rate Last Dose  . albuterol (PROVENTIL) (2.5 MG/3ML) 0.083% nebulizer solution 2.5 mg  2.5 mg Nebulization Q3H PRN Lavon Paganini Angiulli, PA-C      . amiodarone (PACERONE) tablet 400 mg  400 mg Oral BID Lavon Paganini Angiulli, PA-C   400 mg at 12/07/15 0827  . antiseptic oral rinse (CPC / CETYLPYRIDINIUM CHLORIDE 0.05%) solution 7 mL  7 mL Mouth Rinse BID Charlett Blake, MD   7 mL at 12/06/15 2000  . aspirin chewable tablet 81 mg  81 mg Oral Daily Lavon Paganini Angiulli, PA-C   81 mg at 12/07/15 0827  . docusate sodium (COLACE) capsule 100 mg  100 mg Oral BID Emina Riebock, NP   100 mg at 12/07/15 0827  . enoxaparin (LOVENOX) injection 40 mg  40 mg Subcutaneous Q24H Charlett Blake, MD   40 mg at 12/06/15 1247  . folic acid (FOLVITE) tablet 1 mg  1 mg Oral Daily Lavon Paganini Angiulli, PA-C   1 mg at 12/07/15 0827  . furosemide (LASIX) tablet 40 mg  40 mg Oral Daily Lavon Paganini Angiulli, PA-C   40 mg at 12/07/15 0827  . insulin aspart (novoLOG) injection 0-15 Units  0-15 Units Subcutaneous TID WC Lavon Paganini Angiulli, PA-C   2 Units at 12/05/15 [id number]  . lamoTRIgine (LAMICTAL) tablet 200 mg  200 mg Oral QHS Daniel J Angiulli, PA-C   200 mg at 12/06/15 1953  . magic mouthwash w/lidocaine  10 mL Oral QID PRN Lavon Paganini Angiulli, PA-C   10 mL at 12/04/15 1727  . ondansetron (ZOFRAN) tablet 4 mg  4 mg Oral Q6H PRN Lavon Paganini Angiulli, PA-C   4 mg at 12/05/15 0841   Or  . ondansetron (ZOFRAN) injection 4 mg  4 mg Intravenous Q6H PRN Lavon Paganini Angiulli, PA-C   4 mg at 12/01/15 1101  . oxyCODONE-acetaminophen (PERCOCET/ROXICET) 5-325 MG per tablet 1-2 tablet  1-2 tablet Oral Q4H PRN Lavon Paganini Angiulli, PA-C   2 tablet at 12/07/15 0827  . pantoprazole (PROTONIX) EC tablet 40 mg  40 mg Oral Daily Lavon Paganini Angiulli, PA-C   40 mg at 12/07/15 0826  . polyethylene glycol (MIRALAX / GLYCOLAX) packet 17 g  17 g Oral Daily Emina Riebock, NP   17 g at 12/07/15 0826  . QUEtiapine  (SEROQUEL) tablet 50 mg  50 mg Oral QHS Daniel J Angiulli, PA-C   50 mg at 12/06/15 1953  . sodium chloride (OCEAN) 0.65 % nasal spray 1 spray  1 spray Each Nare PRN Charlett Blake, MD   1 spray at 12/04/15 1727  . sodium phosphate (FLEET) 7-19 GM/118ML enema 1 enema  1 enema Rectal Once Emina Riebock, NP   1 enema at 12/05/15 1121  . sorbitol 70 % solution 30 mL  30 mL Oral Daily PRN Lavon Paganini Angiulli, PA-C      . white petrolatum (VASELINE) gel   Topical PRN Charlett Blake, MD  Discharge Medications: Please see discharge summary for a list of discharge medications.  Relevant Imaging Results:  Relevant Lab Results:   Additional Information ss# [id number]  Elease Hashimoto, LCSW

## 2015-12-07 NOTE — Progress Notes (Signed)
Subjective/Complaints: Appreciate surgery follow-up  ROS-postsurgical abdominal pain, having bowel movements, condom cath for bladder, no chest pain or shortness of breath no nausea or vomiting  Objective: Vital Signs: Blood pressure 118/56, pulse 78, temperature 98.5 F (36.9 C), temperature source Oral, resp. rate 18, height '5\' 11"'$  (1.803 m), weight 74 kg (163 lb 2.3 oz), SpO2 98 %. No results found. Results for orders placed or performed during the hospital encounter of 11/29/15 (from the past 72 hour(s))  Glucose, capillary     Status: Abnormal   Collection Time: 12/04/15 11:16 AM  Result Value Ref Range   Glucose-Capillary 116 (H) 65 - 99 mg/dL  Glucose, capillary     Status: Abnormal   Collection Time: 12/04/15  4:10 PM  Result Value Ref Range   Glucose-Capillary 122 (H) 65 - 99 mg/dL   Comment 1 Notify RN   Glucose, capillary     Status: Abnormal   Collection Time: 12/04/15  9:08 PM  Result Value Ref Range   Glucose-Capillary 117 (H) 65 - 99 mg/dL  Glucose, capillary     Status: Abnormal   Collection Time: 12/05/15  6:53 AM  Result Value Ref Range   Glucose-Capillary 123 (H) 65 - 99 mg/dL  CBC with Differential/Platelet     Status: Abnormal   Collection Time: 12/05/15 10:46 AM  Result Value Ref Range   WBC 8.3 4.0 - 10.5 K/uL   RBC 3.64 (L) 4.22 - 5.81 MIL/uL   Hemoglobin 10.8 (L) 13.0 - 17.0 g/dL   HCT 33.6 (L) 39.0 - 52.0 %   MCV 92.3 78.0 - 100.0 fL   MCH 29.7 26.0 - 34.0 pg   MCHC 32.1 30.0 - 36.0 g/dL   RDW 15.6 (H) 11.5 - 15.5 %   Platelets 626 (H) 150 - 400 K/uL   Neutrophils Relative % 78 %   Lymphocytes Relative 9 %   Monocytes Relative 9 %   Eosinophils Relative 3 %   Basophils Relative 1 %   Neutro Abs 6.5 1.7 - 7.7 K/uL   Lymphs Abs 0.7 0.7 - 4.0 K/uL   Monocytes Absolute 0.7 0.1 - 1.0 K/uL   Eosinophils Absolute 0.3 0.0 - 0.7 K/uL   Basophils Absolute 0.1 0.0 - 0.1 K/uL   WBC Morphology TOXIC GRANULATION   Basic metabolic panel     Status:  Abnormal   Collection Time: 12/05/15 10:46 AM  Result Value Ref Range   Sodium 140 135 - 145 mmol/L   Potassium 3.7 3.5 - 5.1 mmol/L   Chloride 106 101 - 111 mmol/L   CO2 25 22 - 32 mmol/L   Glucose, Bld 113 (H) 65 - 99 mg/dL   BUN 8 6 - 20 mg/dL   Creatinine, Ser 1.47 (H) 0.61 - 1.24 mg/dL   Calcium 8.5 (L) 8.9 - 10.3 mg/dL   GFR calc non Af Amer 48 (L) >60 mL/min   GFR calc Af Amer 55 (L) >60 mL/min    Comment: (NOTE) The eGFR has been calculated using the CKD EPI equation. This calculation has not been validated in all clinical situations. eGFR's persistently <60 mL/min signify possible Chronic Kidney Disease.    Anion gap 9 5 - 15  Glucose, capillary     Status: Abnormal   Collection Time: 12/05/15 11:21 AM  Result Value Ref Range   Glucose-Capillary 107 (H) 65 - 99 mg/dL   Comment 1 Notify RN   Glucose, capillary     Status: Abnormal   Collection Time:  12/05/15  4:20 PM  Result Value Ref Range   Glucose-Capillary 116 (H) 65 - 99 mg/dL   Comment 1 Notify RN   Glucose, capillary     Status: Abnormal   Collection Time: 12/05/15  8:45 PM  Result Value Ref Range   Glucose-Capillary 121 (H) 65 - 99 mg/dL  Glucose, capillary     Status: None   Collection Time: 12/06/15  6:22 AM  Result Value Ref Range   Glucose-Capillary 86 65 - 99 mg/dL  Glucose, capillary     Status: None   Collection Time: 12/06/15 11:39 AM  Result Value Ref Range   Glucose-Capillary 82 65 - 99 mg/dL   Comment 1 Notify RN   Glucose, capillary     Status: None   Collection Time: 12/06/15  4:29 PM  Result Value Ref Range   Glucose-Capillary 95 65 - 99 mg/dL   Comment 1 Notify RN   Glucose, capillary     Status: Abnormal   Collection Time: 12/06/15  9:01 PM  Result Value Ref Range   Glucose-Capillary 103 (H) 65 - 99 mg/dL  Glucose, capillary     Status: Abnormal   Collection Time: 12/07/15  6:43 AM  Result Value Ref Range   Glucose-Capillary 108 (H) 65 - 99 mg/dL      General: No acute  distress HEENT: Poor dentition, granulation tissue at corner of oral stoma bilaterally Mood and affect are appropriate Heart: Regular rate and rhythm no rubs murmurs or extra sounds Lungs: Clear to auscultation, breathing unlabored, no rales or wheezes Abdomen: Positive bowel sounds, soft nontender to palpation, nondistended,midline abdominal incision packed with wet to dry gauze, good granulation tissue, no purulent drainage, no surrounding erythema or maceration. Extremities: No clubbing, cyanosis, or edema Skin: No evidence of breakdown, no evidence of rash Neurologic: Cranial nerves II through XII intact, motor strength is 4/5 in bilateral deltoid, bicep, tricep, grip, hip flexor, knee extensors, ankle dorsiflexor and plantar flexor Sensory exam normal sensation to light touch and proprioception in bilateral upper and lower extremities Cerebellar exam normal finger to nose to finger as well as heel to shin in bilateral upper and lower extremities Musculoskeletal: Full range of motion in all 4 extremities. No joint swelling   Assessment/Plan: 1. 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 Physiatrist is providing close team supervision and 24 hour management of active medical problems listed below. Physiatrist and rehab team continue to assess barriers to discharge/monitor patient progress toward functional and medical goals. FIM: Function - Bathing Position: Wheelchair/chair at sink Body parts bathed by patient: Right arm, Left arm, Chest, Front perineal area, Buttocks, Right upper leg, Left upper leg Body parts bathed by helper: Back Bathing not applicable: Abdomen, Right lower leg, Left lower leg Assist Level: Set up Set up : To obtain items  Function- Upper Body Dressing/Undressing Upper body  dressing/undressing activity did not occur: Refused (patient complaining of pain, did not want to get OOB) What is the patient wearing?: Pull over shirt/dress Pull over shirt/dress - Perfomed by patient: Thread/unthread right sleeve, Thread/unthread left sleeve, Put head through opening, Pull shirt over trunk Assist Level: Set up Set up : To obtain clothing/put away Function - Lower Body Dressing/Undressing Lower body dressing/undressing activity did not occur: Refused What is the patient wearing?: Pants Position: Wheelchair/chair at sink Pants- Performed by patient: Thread/unthread right pants leg, Thread/unthread left  pants leg, Pull pants up/down Non-skid slipper socks- Performed by helper: Don/doff right sock, Don/doff left sock Assist for footwear: Dependant (per report non-slip socks for safety, applied by staff) Assist for lower body dressing: Set up  Function - Toileting Toileting activity did not occur: No continent bowel/bladder event Toileting steps completed by patient: Adjust clothing prior to toileting, Adjust clothing after toileting Toileting steps completed by helper: Performs perineal hygiene Toileting Assistive Devices: Grab bar or rail Assist level: Touching or steadying assistance (Pt.75%)  Function - Toilet Transfers Toilet transfer assistive device: Elevated toilet seat/BSC over toilet, Grab bar Assist level to toilet: Touching or steadying assistance (Pt > 75%) Assist level from toilet: Touching or steadying assistance (Pt > 75%) Assist level to bedside commode (at bedside): Touching or steadying assistance (Pt > 75%) Assist level from bedside commode (at bedside): Touching or steadying assistance (Pt > 75%)  Function - Chair/bed transfer Chair/bed transfer method: Stand pivot Chair/bed transfer assist level: Supervision or verbal cues Chair/bed transfer assistive device: Armrests Chair/bed transfer details: Visual cues/gestures for  precautions/safety  Function - Locomotion: Wheelchair Will patient use wheelchair at discharge?:  (TBD) Type: Manual Max wheelchair distance: 200 ft Assist Level: Supervision or verbal cues Assist Level: Supervision or verbal cues Assist Level: Supervision or verbal cues Turns around,maneuvers to table,bed, and toilet,negotiates 3% grade,maneuvers on rugs and over doorsills: No Function - Locomotion: Ambulation Assistive device: Walker-rolling Max distance: 150 Assist level: Supervision or verbal cues Assist level: Supervision or verbal cues Walk 50 feet with 2 turns activity did not occur: Safety/medical concerns Assist level: Supervision or verbal cues Walk 150 feet activity did not occur: Safety/medical concerns Assist level: Supervision or verbal cues Walk 10 feet on uneven surfaces activity did not occur: Safety/medical concerns Assist level: Touching or steadying assistance (Pt > 75%)  Function - Comprehension Comprehension: Auditory Comprehension assist level: Follows basic conversation/direction with no assist  Function - Expression Expression: Verbal Expression assist level: Expresses basic needs/ideas: With no assist  Function - Social Interaction Social Interaction assist level: Interacts appropriately with others - No medications needed.  Function - Problem Solving Problem solving assist level: Solves basic problems with no assist  Function - Memory Memory assist level: Recognizes or recalls 90% of the time/requires cueing < 10% of the time Patient normally able to recall (first 3 days only): Current season, Location of own room, Staff names and faces, That he or she is in a hospital  Medical Problem List and Plan: 1.  Debilitation secondary to respiratory failure/small bowel repair/septic shock Maxcine Ham pneumonia /multiple medical-patient deciding on SNF placement given lack of support at home. We'll get neuropsych to evaluate. He has had no agitation, no sign  of uncontrolled bipolar disorder 2.  DVT Prophylaxis/Anticoagulation: Subcutaneous heparin. Monitor for any bleeding episode 3. Pain Management: Oxycodone as needed,Will wean4. Acute blood loss anemia. Follow-up CBC, Hgb 9.2 5. Neuropsych: This patient is capable of making decisions on his own behalf.would like to have neuropsychology look more in detail at his decision-making capacity as well as evidence of alcohol-induced encephalopathy 6. Skin/Wound Care: Routine skin checks 7. Fluids/Electrolytes/Nutrition: Routine I&O with follow-up chemistries 8. A. fib with RVR/complete heart block. Continue amiodarone.Cardiac rate controlled. 9. Acute on chronic systolic congestive heart failure. Monitor for any signs of fluid overload. Continue Lasix 40 mg daily as directed, no signs of peripheral edema 10. Adrenal insufficiency.completed Solu-Cortef 11. Mood/bipolar disorder.  Lamictal 200 mg daily at bedtime as well as Seroquel 50 mg daily at bedtime and  titrate up the home dose of 300 mg as needed.no need for increased dose of Seroquel at the current time 12. Decreased nutritional storage. Advance diet as tolerated.intake 10-50% meals,  13. Alcohol polysubstance abuse. Counseling. Monitor for any signs of withdrawal, neuropsych evaluation 14. Hypokalemia. Continue potassium supplement. Follow-up chemistries, K 4.7 15. Oral ulcer, both on mucosal and oral commissure,angular chelitis, will start antifungal cream 16. Hypoalbuminemia Protostat, dietary consult 17. Poor memory, this may be evidence of postoperative versus alcoholic encephalopathy, will ask speech therapy to evaluate LOS (Days) 8 A FACE TO FACE EVALUATION WAS PERFORMED  Reginna Sermeno E 12/07/2015, 9:04 AM

## 2015-12-07 NOTE — Progress Notes (Signed)
Occupational Therapy Note  Patient Details  Name: Shawn [name] MRN: [id number] Date of Birth: [birth year]  Today's Date: 12/07/2015 OT Missed Time: 17 Minutes Missed Time Reason: Patient unwilling/refused to participate without medical reason  Pt missed 45 min skilled OT treatment session secondary to refusal.  Pt in bed upon arrival, reporting fatigued and having just received pain medication.  Pt declined OOB therapy and even option to complete bed level therapy.  Will continue to follow as ordered.   Simonne Come 12/07/2015, 1:11 PM

## 2015-12-07 NOTE — Progress Notes (Signed)
Occupational Therapy Session Note  Patient Details  Name: Shawn [name] MRN: [id number] Date of Birth: 11-16-1950  Today's Date: 12/07/2015 OT Individual Time: 1005-1020 OT Individual Time Calculation (min): 15 min  and Today's Date: 12/07/2015 OT Missed Time: 55 Minutes Missed Time Reason: Patient unwilling/refused to participate without medical reason   Short Term Goals: Week 1:  OT Short Term Goal 1 (Week 1): Pt will complete bathing at sit > stand level with min assist OT Short Term Goal 2 (Week 1): Pt will complete UB bathing with min assist OT Short Term Goal 3 (Week 1): Pt will complete LB dressing with min assist at sit > stand level OT Short Term Goal 4 (Week 1): Pt will complete toilet transfers with min assist with LRAD OT Short Term Goal 5 (Week 1): Pt will complete 1 grooming task in standing with min assist  Skilled Therapeutic Interventions/Progress Updates:    1) Treatment session with focus on bed mobility and activity tolerance.  Pt reports increased dizziness and not wanting to get OOB.  Assessed vitals 93/55 lying and 90/45 sitting at EOB.  Pt reports increase in dizziness and nausea during supine to sit and while sitting EOB.  Pt returned to bed and declined further activity at this time.  Notified RN of BP and reports of dizziness and nausea.  Will continue to follow POC.  Therapy Documentation Precautions:  Precautions Precautions: Fall Restrictions Weight Bearing Restrictions: No General: General OT Amount of Missed Time: 45 Minutes Pain: Pain Assessment Pain Assessment: 0-10 Pain Score: 7  Pain Type: Acute pain Pain Location: Knee Pain Orientation: Right Pain Descriptors / Indicators: Aching Pain Frequency: Intermittent Pain Onset: Gradual Patients Stated Pain Goal: 3 Pain Intervention(s): Medication (See eMAR)  See Function Navigator for Current Functional Status.   Therapy/Group: Individual Therapy  Simonne Come 12/07/2015, 10:36 AM

## 2015-12-07 NOTE — Progress Notes (Signed)
Physical Therapy Session Note  Patient Details  Name: Shawn [name] MRN: [id number] Date of Birth: December 11, 1950  Today's Date: 12/07/2015 PT Individual Time: 0800-0900 and 1500-1530 PT Individual Time Calculation (min): 60 min and 30 min (total 90 min)   Short Term Goals: Week 1:  PT Short Term Goal 1 (Week 1): Pt will perform stand pivot transfer with S PT Short Term Goal 2 (Week 1): Pt will perform bed mobility with S PT Short Term Goal 3 (Week 1): Pt will perform gait with LRAD x100' with CGA and LRAD PT Short Term Goal 4 (Week 1): Pt will initiate stair training  Skilled Therapeutic Interventions/Progress Updates:    Tx 1: Pt received supine in bed, c/o pain 8.5/10 in abdomen and agreeable to treatment. Pt frustrated with how he is positioned in bed and says he can't eat sitting in bed; able to redirect and calm pt, agreeable to sit in w/c. Stand pivot transfer bed>w/c with close S. Seated in w/c, pt performs self feeding with mod I. C/o nose/mouth pain limiting what he can eat. Performs dressing upper body with setupA, lower body minA for threading LLE. Cues for threading RLE first due to knee pain. Sit <>stand with S for donning pants and brief. Gait to gym x150' with RW and S. Seated and standing LE exercises including marching, heel raises, long arc quad, hip adduction isometric. Requires supine rest break due to fatigue and dizziness. Gait with no AD x90' with min guard. Pt reports feeling very unsteady and limited by R knee pain. States he is too dizzy to complete session, becomes verbally agitated stating he's going to fall and that's why he's in the hospital in the first place. When reminded of abdominal issues/surgery, pt becomes agitated saying "I don't know why I'm here, I just want to go back to my room". W/c propulsion to room with S. Remained seated in w/c at completion of session, quick release belt intact and all needs within reach.   Tx 2: Pt received asleep in bed, easily awoken  and agreeable to treatment. Pt uses urinal with setupA. Supine >sit mod I. Gait into bathroom with RW and S. Seated on toilet with distant S, performed hygiene however required therapist to complete due to difficulty reaching behind back. Gait out of bathroom without AD per pt request and standing at sink to watch hands with min guard. Gait with RW x150' with S, min cues for navigation, posture and safety with RW. Returned to bed with S stand pivot. Remained supine in bed with alarm intact and all needs within reach at completion of session.   Therapy Documentation Precautions:  Precautions Precautions: Fall Restrictions Weight Bearing Restrictions: No Pain: Pain Assessment Pain Assessment: 0-10 Pain Score: 8  Pain Type: Acute pain Pain Location: Knee Pain Orientation: Right Pain Descriptors / Indicators: Aching Pain Frequency: Intermittent Pain Onset: Gradual Patients Stated Pain Goal: 3 Pain Intervention(s): Medication (See eMAR)   See Function Navigator for Current Functional Status.   Therapy/Group: Individual Therapy  Luberta Mutter 12/07/2015, 3:30 PM

## 2015-12-08 ENCOUNTER — Inpatient Hospital Stay (HOSPITAL_COMMUNITY): Payer: Medicare Other | Admitting: Physical Therapy

## 2015-12-08 ENCOUNTER — Inpatient Hospital Stay (HOSPITAL_COMMUNITY): Payer: Medicare Other | Admitting: Occupational Therapy

## 2015-12-08 LAB — GLUCOSE, CAPILLARY
GLUCOSE-CAPILLARY: 90 mg/dL (ref 65–99)
Glucose-Capillary: 89 mg/dL (ref 65–99)
Glucose-Capillary: 100 mg/dL — ABNORMAL HIGH (ref 65–99)
Glucose-Capillary: 147 mg/dL — ABNORMAL HIGH (ref 65–99)

## 2015-12-08 NOTE — Progress Notes (Signed)
Subjective/Complaints: Appreciate surgery follow-up Feels good this am. Cont BM ROS-postsurgical abdominal pain, having bowel movements, condom cath for bladder, no chest pain or shortness of breath no nausea or vomiting  Objective: Vital Signs: Blood pressure 110/61, pulse 72, temperature 98.3 F (36.8 C), temperature source Oral, resp. rate 18, height '5\' 11"'$  (1.803 m), weight 70.3 kg (154 lb 15.7 oz), SpO2 98 %. No results found. Results for orders placed or performed during the hospital encounter of 11/29/15 (from the past 72 hour(s))  CBC with Differential/Platelet     Status: Abnormal   Collection Time: 12/05/15 10:46 AM  Result Value Ref Range   WBC 8.3 4.0 - 10.5 K/uL   RBC 3.64 (L) 4.22 - 5.81 MIL/uL   Hemoglobin 10.8 (L) 13.0 - 17.0 g/dL   HCT 33.6 (L) 39.0 - 52.0 %   MCV 92.3 78.0 - 100.0 fL   MCH 29.7 26.0 - 34.0 pg   MCHC 32.1 30.0 - 36.0 g/dL   RDW 15.6 (H) 11.5 - 15.5 %   Platelets 626 (H) 150 - 400 K/uL   Neutrophils Relative % 78 %   Lymphocytes Relative 9 %   Monocytes Relative 9 %   Eosinophils Relative 3 %   Basophils Relative 1 %   Neutro Abs 6.5 1.7 - 7.7 K/uL   Lymphs Abs 0.7 0.7 - 4.0 K/uL   Monocytes Absolute 0.7 0.1 - 1.0 K/uL   Eosinophils Absolute 0.3 0.0 - 0.7 K/uL   Basophils Absolute 0.1 0.0 - 0.1 K/uL   WBC Morphology TOXIC GRANULATION   Basic metabolic panel     Status: Abnormal   Collection Time: 12/05/15 10:46 AM  Result Value Ref Range   Sodium 140 135 - 145 mmol/L   Potassium 3.7 3.5 - 5.1 mmol/L   Chloride 106 101 - 111 mmol/L   CO2 25 22 - 32 mmol/L   Glucose, Bld 113 (H) 65 - 99 mg/dL   BUN 8 6 - 20 mg/dL   Creatinine, Ser 1.47 (H) 0.61 - 1.24 mg/dL   Calcium 8.5 (L) 8.9 - 10.3 mg/dL   GFR calc non Af Amer 48 (L) >60 mL/min   GFR calc Af Amer 55 (L) >60 mL/min    Comment: (NOTE) The eGFR has been calculated using the CKD EPI equation. This calculation has not been validated in all clinical situations. eGFR's persistently <60  mL/min signify possible Chronic Kidney Disease.    Anion gap 9 5 - 15  Glucose, capillary     Status: Abnormal   Collection Time: 12/05/15 11:21 AM  Result Value Ref Range   Glucose-Capillary 107 (H) 65 - 99 mg/dL   Comment 1 Notify RN   Glucose, capillary     Status: Abnormal   Collection Time: 12/05/15  4:20 PM  Result Value Ref Range   Glucose-Capillary 116 (H) 65 - 99 mg/dL   Comment 1 Notify RN   Glucose, capillary     Status: Abnormal   Collection Time: 12/05/15  8:45 PM  Result Value Ref Range   Glucose-Capillary 121 (H) 65 - 99 mg/dL  Glucose, capillary     Status: None   Collection Time: 12/06/15  6:22 AM  Result Value Ref Range   Glucose-Capillary 86 65 - 99 mg/dL  Glucose, capillary     Status: None   Collection Time: 12/06/15 11:39 AM  Result Value Ref Range   Glucose-Capillary 82 65 - 99 mg/dL   Comment 1 Notify RN   Glucose, capillary  Status: None   Collection Time: 12/06/15  4:29 PM  Result Value Ref Range   Glucose-Capillary 95 65 - 99 mg/dL   Comment 1 Notify RN   Glucose, capillary     Status: Abnormal   Collection Time: 12/06/15  9:01 PM  Result Value Ref Range   Glucose-Capillary 103 (H) 65 - 99 mg/dL  Glucose, capillary     Status: Abnormal   Collection Time: 12/07/15  6:43 AM  Result Value Ref Range   Glucose-Capillary 108 (H) 65 - 99 mg/dL  Glucose, capillary     Status: Abnormal   Collection Time: 12/07/15 12:57 PM  Result Value Ref Range   Glucose-Capillary 123 (H) 65 - 99 mg/dL  Glucose, capillary     Status: Abnormal   Collection Time: 12/07/15  5:09 PM  Result Value Ref Range   Glucose-Capillary 105 (H) 65 - 99 mg/dL  Glucose, capillary     Status: Abnormal   Collection Time: 12/07/15  8:31 PM  Result Value Ref Range   Glucose-Capillary 123 (H) 65 - 99 mg/dL  Glucose, capillary     Status: None   Collection Time: 12/08/15  6:32 AM  Result Value Ref Range   Glucose-Capillary 89 65 - 99 mg/dL  Glucose, capillary     Status: None    Collection Time: 12/08/15  7:17 AM  Result Value Ref Range   Glucose-Capillary 90 65 - 99 mg/dL      General: No acute distress HEENT: Poor dentition, granulation tissue at corner of oral stoma bilaterally Mood and affect are appropriate Heart: Regular rate and rhythm no rubs murmurs or extra sounds Lungs: Clear to auscultation, breathing unlabored, no rales or wheezes Abdomen: Positive bowel sounds, soft nontender to palpation, nondistended,midline abdominal incision packed with wet to dry gauze, good granulation tissue, no purulent drainage, no surrounding erythema or maceration. Extremities: No clubbing, cyanosis, or edema Skin: No evidence of breakdown, no evidence of rash Neurologic: Cranial nerves II through XII intact, motor strength is 4/5 in bilateral deltoid, bicep, tricep, grip, hip flexor, knee extensors, ankle dorsiflexor and plantar flexor Sensory exam normal sensation to light touch and proprioception in bilateral upper and lower extremities Cerebellar exam normal finger to nose to finger as well as heel to shin in bilateral upper and lower extremities Musculoskeletal: Full range of motion in all 4 extremities. No joint swelling   Assessment/Plan: 1. 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 Physiatrist is providing close team supervision and 24 hour management of active medical problems listed below. Physiatrist and rehab team continue to assess barriers to discharge/monitor patient progress toward functional and medical goals. FIM: Function - Bathing Position: Wheelchair/chair at sink Body parts bathed by patient: Right arm, Left arm, Chest, Front perineal area, Buttocks, Right upper leg, Left upper leg Body parts bathed by helper: Back Bathing not applicable: Abdomen, Right lower leg,  Left lower leg Assist Level: Set up Set up : To obtain items  Function- Upper Body Dressing/Undressing Upper body dressing/undressing activity did not occur: Refused (patient complaining of pain, did not want to get OOB) What is the patient wearing?: Pull over shirt/dress Pull over shirt/dress - Perfomed by patient: Thread/unthread right sleeve, Thread/unthread left sleeve, Put head through opening, Pull shirt over trunk Assist Level: Set up Set up : To obtain clothing/put away Function - Lower Body Dressing/Undressing Lower body dressing/undressing activity did  not occur: Refused What is the patient wearing?: Pants Position: Wheelchair/chair at sink Pants- Performed by patient: Thread/unthread right pants leg, Thread/unthread left pants leg, Pull pants up/down Non-skid slipper socks- Performed by helper: Don/doff right sock, Don/doff left sock Assist for footwear: Dependant (per report non-slip socks for safety, applied by staff) Assist for lower body dressing: Set up  Function - Toileting Toileting activity did not occur: No continent bowel/bladder event Toileting steps completed by patient: Adjust clothing prior to toileting, Adjust clothing after toileting Toileting steps completed by helper: Performs perineal hygiene Toileting Assistive Devices: Grab bar or rail Assist level: Touching or steadying assistance (Pt.75%)  Function - Air cabin crew transfer assistive device: Grab bar Assist level to toilet: Supervision or verbal cues Assist level from toilet: Supervision or verbal cues Assist level to bedside commode (at bedside): Touching or steadying assistance (Pt > 75%) Assist level from bedside commode (at bedside): Touching or steadying assistance (Pt > 75%)  Function - Chair/bed transfer Chair/bed transfer method: Stand pivot Chair/bed transfer assist level: Supervision or verbal cues Chair/bed transfer assistive device: Armrests Chair/bed transfer details: Visual  cues/gestures for precautions/safety  Function - Locomotion: Wheelchair Will patient use wheelchair at discharge?:  (TBD) Type: Manual Max wheelchair distance: 200 ft Assist Level: Supervision or verbal cues Assist Level: Supervision or verbal cues Assist Level: Supervision or verbal cues Turns around,maneuvers to table,bed, and toilet,negotiates 3% grade,maneuvers on rugs and over doorsills: No Function - Locomotion: Ambulation Assistive device: No device Max distance: 90 Assist level: Touching or steadying assistance (Pt > 75%) Assist level: Touching or steadying assistance (Pt > 75%) Walk 50 feet with 2 turns activity did not occur: Safety/medical concerns Assist level: Touching or steadying assistance (Pt > 75%) Walk 150 feet activity did not occur: Safety/medical concerns Assist level: Supervision or verbal cues Walk 10 feet on uneven surfaces activity did not occur: Safety/medical concerns Assist level: Touching or steadying assistance (Pt > 75%)  Function - Comprehension Comprehension: Auditory Comprehension assist level: Follows basic conversation/direction with no assist  Function - Expression Expression: Verbal Expression assist level: Expresses basic needs/ideas: With no assist  Function - Social Interaction Social Interaction assist level: Interacts appropriately with others - No medications needed.  Function - Problem Solving Problem solving assist level: Solves basic problems with no assist  Function - Memory Memory assist level: Recognizes or recalls 90% of the time/requires cueing < 10% of the time Patient normally able to recall (first 3 days only): Current season, Location of own room, Staff names and faces, That he or she is in a hospital  Medical Problem List and Plan: 1.  Debilitation secondary to respiratory failure/small bowel repair/septic shock Burman Blacksmith pneumonia /multiple medical-stable for SNF when bed available   2.  DVT  Prophylaxis/Anticoagulation: Subcutaneous heparin. Monitor for any bleeding episode 3. Pain Management: Oxycodone as needed,Will wean 4. Acute blood loss anemia. Follow-up CBC, Hgb 9.2 5. Neuropsych: This patient is capable of making decisions on his own behalf.would like to have neuropsychology look more in detail at his decision-making capacity as well as evidence of alcohol-induced encephalopathy 6. Skin/Wound Care: Routine skin checks 7. Fluids/Electrolytes/Nutrition: Routine I&O with follow-up chemistries 8. A. fib with RVR/complete heart block. Continue amiodarone.Cardiac rate controlled. 9. Acute on chronic systolic congestive heart failure. Monitor for any signs of fluid overload. Continue Lasix 40 mg daily as directed, no signs of peripheral edema 10. Adrenal insufficiency.completed Solu-Cortef 11. Mood/bipolar disorder.  Lamictal 200 mg daily at bedtime as well as Seroquel 50 mg  daily at bedtime and titrate up the home dose of 300 mg as needed.no need for increased dose of Seroquel at the current time 12. Decreased nutritional storage. Advance diet as tolerated.intake 10-50% meals,  13. Alcohol polysubstance abuse. Counseling. Monitor for any signs of withdrawal, neuropsych evaluation 14. Hypokalemia. Continue potassium supplement. Follow-up chemistries, K 4.7 15. Oral ulcer, both on mucosal and oral commissure,angular chelitis, will start antifungal cream 16. Hypoalbuminemia Protostat, dietary consult 17. Poor memory, this may be evidence of postoperative versus alcoholic encephalopathy, will ask speech therapy to evaluate LOS (Days) 9 A FACE TO FACE EVALUATION WAS PERFORMED  KIRSTEINS,ANDREW E 12/08/2015, 8:31 AM

## 2015-12-08 NOTE — Progress Notes (Signed)
Occupational Therapy Weekly Progress Note  Patient Details  Name: Shawn [name] MRN: [id number] Date of Birth: Jan 16, 1951  Beginning of progress report period: November 30, 2015 End of progress report period: December 08, 2015  Today's Date: 12/08/2015 OT Individual Time: 1130-1200 OT Individual Time Calculation (min): 30 min    Patient has met 5 of 5 short term goals.  Pt making steady progress towards goals.  Pt currently min-supervision with self-care tasks.  Pt requires increased time due to abdominal pain and nausea and dizziness with mobility.  Max encouragement to participate due to decreased motivation. Pt now NHP due to decreased family support at d/c and need for dressing changes to abdomen.  Pt's POC has been changed to QD, once daily, due to decreased participation and ability to tolerate 3 hr schedule.  Patient continues to demonstrate the following deficits: decreased activity tolerance and endurance, dizziness and nausea with mobility and therefore will continue to benefit from skilled OT intervention to enhance overall performance with BADL and Reduce care partner burden.  Patient progressing toward long term goals..  Continue plan of care.  OT Short Term Goals Week 1:  OT Short Term Goal 1 (Week 1): Pt will complete bathing at sit > stand level with min assist OT Short Term Goal 1 - Progress (Week 1): Met OT Short Term Goal 2 (Week 1): Pt will complete UB bathing with min assist OT Short Term Goal 2 - Progress (Week 1): Met OT Short Term Goal 3 (Week 1): Pt will complete LB dressing with min assist at sit > stand level OT Short Term Goal 3 - Progress (Week 1): Met OT Short Term Goal 4 (Week 1): Pt will complete toilet transfers with min assist with LRAD OT Short Term Goal 4 - Progress (Week 1): Met OT Short Term Goal 5 (Week 1): Pt will complete 1 grooming task in standing with min assist OT Short Term Goal 5 - Progress (Week 1): Met Week 2:  OT Short Term Goal 1 (Week 2): STG  = LTGs due to remaining LOS  Skilled Therapeutic Interventions/Progress Updates:    Treatment session with focus on transfers, functional mobility, and overall activity participation.  Pt completed stand pivot transfer to w/c with supervision/setup.  Engaged in [place] strengthening activity with 2# dowel rod with pt able to recall exercises from previous session and completing 2 reps of 10 of each exercise.  Ambulated approx 100 feet with RW and supervision.  Propelled w/c back to room when given opportunity to walk or propel w/c.  Returned to bed stand pivot with setup only.  Therapy Documentation Precautions:  Precautions Precautions: Fall Restrictions Weight Bearing Restrictions: No General:   Vital Signs: Therapy Vitals Temp: 98.3 F (36.8 C) Temp Source: Oral Pulse Rate: 72 Resp: 18 BP: 110/61 mmHg Patient Position (if appropriate): Lying Oxygen Therapy SpO2: 98 % O2 Device: Not Delivered Pain:  Pt with no c/o pain  See Function Navigator for Current Functional Status.   Therapy/Group: Individual Therapy  Simonne Come 12/08/2015, 7:51 AM

## 2015-12-08 NOTE — Progress Notes (Signed)
Social Work Patient ID: Shawn [name], male   DOB: April 10, 1951, 65 y.o.   MRN: [id number] Met with pt to discuss bed offers, he is concerned his son has his bank card and wants it back. Have contacted son and left a message to bring pt's bank care back. He has decided on [facility] since he has been there before. Will work toward transfer on Monday. Will gather paperwork in preparation. Team and PA aware.

## 2015-12-08 NOTE — Progress Notes (Signed)
Physical Therapy Weekly Progress Note  Patient Details  Name: Shawn [name] MRN: [id number] Date of Birth: Apr 01, 1951  Beginning of progress report period: November 30, 2015 End of progress report period: December 08, 2015  Today's Date: 12/08/2015 PT Individual Time: 0900-0928 PT Individual Time Calculation (min): 28 min   Patient has met 4 of 4 short term goals. Pt requires S overall for transfers, gait with RW, stairs and w/c propulsion. Requires max encouragement to participate; repetitive education performed regarding role of therapy and importance of maintaining strength, mobility to improve independence home and prepare for d/c. Pt now NHP due to decreased family support at d/c and need for dressing changes to abdomen. Pt's POC has been changed to QD, once daily, due to decreased participation and ability to tolerate 3 hr schedule.  Patient continues to demonstrate the following deficits: impaired activity tolerance, balance, postural control, ability to compensate for deficits and awareness.and therefore will continue to benefit from skilled PT intervention to enhance overall performance with  Patient progressing toward long term goals..  Continue plan of care.  PT Short Term Goals Week 1:  PT Short Term Goal 1 (Week 1): Pt will perform stand pivot transfer with S PT Short Term Goal 1 - Progress (Week 1): Met PT Short Term Goal 2 (Week 1): Pt will perform bed mobility with S PT Short Term Goal 2 - Progress (Week 1): Met PT Short Term Goal 3 (Week 1): Pt will perform gait with LRAD x100' with CGA and LRAD PT Short Term Goal 3 - Progress (Week 1): Met PT Short Term Goal 4 (Week 1): Pt will initiate stair training PT Short Term Goal 4 - Progress (Week 1): Met Week 2:  PT Short Term Goal 1 (Week 2): =LTG due to estimated LOS  Skilled Therapeutic Interventions/Progress Updates:    Pt received supine in bed, agitated and c/o pain as described below. Pt reports he is uncomfortable in his  bed, lost his urinal, can't find his phone, thirsty and "is just having a bad morning". Initially declines therapy, however with encouragement and education regarding QD schedule and only two short sessions today pt eventually agrees to session. Supine>sit mod I. Gait with RW x50' before pt declining to ambulate further. W/c propulsion 2x150' with S. Nustep x5 min total with several rest breaks due to fatigue. Pt verbally agitated throughout session, requires education, redirecting and allowing pt input into activities in order to improve participation. Stand pivot to return to bed. Remained supine in bed with alarm intact and all needs within reach at completion of session.   Therapy Documentation Precautions:  Precautions Precautions: Fall Restrictions Weight Bearing Restrictions: No Pain: Pain Assessment Pain Assessment: 0-10 Pain Score: 7  Pain Type: Surgical pain Pain Location: Abdomen Pain Orientation: Mid;Lower Pain Descriptors / Indicators: Aching Pain Frequency: Intermittent Pain Onset: Gradual Patients Stated Pain Goal: 3 Pain Intervention(s): Medication (See eMAR)   See Function Navigator for Current Functional Status.  Therapy/Group: Individual Therapy  Luberta Mutter 12/08/2015, 10:20 AM

## 2015-12-08 NOTE — Discharge Summary (Signed)
NAME:  Shawn [name], Shawn [name] NO.:  [id number]  MEDICAL RECORD NO.:  [id number]  LOCATION:  [id number]                        FACILITY:  [facility]  PHYSICIAN:  Charlett Blake, M.D.DATE OF BIRTH:  [birth year]  DATE OF ADMISSION:  11/28/2015 DATE OF DISCHARGE:  12/11/2015                              DISCHARGE SUMMARY   DISCHARGE DIAGNOSES: 1. Debilitation secondary to respiratory failure, small-bowel repair,     septic shock. 2. Subcutaneous heparin for deep venous thrombosis prophylaxis. 3. Pain management. 4. Acute blood loss anemia. 5. Atrial fibrillation with rapid ventricular response. 6. Acute-on-chronic systolic congestive heart failure. 7. Adrenal insufficiency. 8. Bipolar disorder. 9. Alcohol, polysubstance abuse. 10.Hypokalemia, resolved. 11.Decreased nutritional storage.  HISTORY OF PRESENT ILLNESS:  This is a 65 year old right-handed male, history of polysubstance alcohol abuse, systolic congestive heart failure, lives alone, independent prior to admission. Presented on November 17, 2015, with 3-day history of abdominal pain, nausea, vomiting, constipation.  Noted history of small-bowel resection in the past for incarcerated femoral hernia perforation.  Findings of lactic acid of 12, serum creatinine 6.7.  CT of abdomen and pelvis with extensive small bowel pneumatosis, mesenteric and portal venous gas.  Underwent exploratory laparotomy, on November 17, 2015, found to have viable gut.  No evidence of ischemic bowel.  Wound was left open until November 21, 2015, underwent closure.  Postoperative ventilatory management.  Cardiology consulted for findings of complete heart block.  STEMI with heart rate in the 30s, developed atrial fibrillation with RVR.  Echocardiogram with ejection fraction 45%.  Severe inferior lateral hypokinesis.  The patient did receive temporary pacemaker, placed on intravenous amiodarone.  Extubated on November 26, 2015.  Creatinine improved to  1.04, bouts of hypokalemia with supplement added.  Completed a course of antibiotics for Klebsiella pneumonia.  Subcutaneous heparin for DVT prophylaxis.  The patient was admitted for comprehensive rehab program.  PAST MEDICAL HISTORY:  See discharge diagnoses.  SOCIAL HISTORY:  Lives alone, independent prior to admission. Functional status upon admission to [place] was moderate assist, sit to stand; min to mod, assist activities of daily living.  PHYSICAL EXAMINATION:  VITAL SIGNS:  Blood pressure 117/64, pulse 71, temperature 98, respirations 18. GENERAL:  This was an alert male, oriented to person, place, and time. LUNGS:  Decreased breath sounds.  Clear to auscultation. CARDIAC:  Rate controlled. ABDOMEN:  Soft, nontender.  Good bowel sounds.  He had a wet-to-dry dressing to abdominal wound.  REHABILITATION HOSPITAL COURSE:  The patient was admitted to Inpatient Rehab Services with therapies initiated on a 3-hour daily basis, consisting of physical therapy, occupational therapy, and rehabilitation nursing.  The following issues were addressed during patient's rehabilitation stay.  Pertaining to Mr. Dargan's respiratory failure, small bowel repair, septic shock, he continued to progress well with therapies.  Subcutaneous heparin for DVT prophylaxis.  Acute blood loss anemia 9.2 and monitored.  Hospital course, atrial fibrillation with RVR.  He continued on amiodarone as directed.  He exhibited no signs of fluid overload.  He remained on Lasix 40 mg daily.  He had undergone small bowel repair, surgical site healing nicely.  Close monitoring of skin.  Had a history of alcohol polysubstance abuse.  Received  full counsel in regard to cessation of these products.  It was questionable if he would be compliant with these requests.  History of bipolar disorder, he remained on Lamictal 200 mg at bedtime as well as Seroquel 50 mg at bedtime.  He did exhibit some decrease in memory  perhaps postoperative versus alcoholic encephalopathy.  He was able to communicate his needs without any difficulties.  The patient received weekly collaborative interdisciplinary team conferences to discuss estimated length of stay, family teaching, and any barriers to discharge.  Required supervision overall for transfers, ambulating with a rolling walker, navigating stairs, and wheelchair propulsion.  He did require some encouragement at times to participate with his therapies. Gather belongings for activities of daily living and homemaking.  The patient with limited resources at home and felt skilled nursing facility was needed with bed becoming available on December 11, 2015.  DISCHARGE MEDICATIONS:  Included: 1. Amiodarone 400 mg p.o. b.i.d. 2. Aspirin 81 mg p.o. daily. 3. Colace 100 mg p.o. b.i.d. 4. Folic acid 1 mg p.o. daily. 5. Lasix 40 mg p.o. daily. 6. Lamictal 200 mg p.o. daily. 7. Protonix 40 mg p.o. daily. 8. MiraLax 17 g daily. 9. Seroquel 50 mg p.o. at bedtime.  DIET:  His diet was regular.  Continue wet-to-dry dressings to abdominal wound 2 times daily as needed.  The patient could follow up with Dr. Georgette Dover, General Surgery as needed; Cardiology Services, Dr. Debara Pickett, for any questions in regard to cardiac issues.     Lauraine Rinne, P.A.   ______________________________ Charlett Blake, M.D.    DA/MEDQ  D:  12/08/2015  T:  12/08/2015  Job:  [id number]  cc:   Dr. Lyman Bishop Tammi Sou, MD

## 2015-12-08 NOTE — Discharge Summary (Signed)
Discharge summary job # [id number]

## 2015-12-08 NOTE — Progress Notes (Signed)
Social Work Patient ID: Shawn [name], male   DOB: May 19, 1951, 65 y.o.   MRN: [id number] [facility] level two number received will now fax out FL2 and await bed offer. Pt made aware of this and is agreeable to the plan.

## 2015-12-09 ENCOUNTER — Inpatient Hospital Stay (HOSPITAL_COMMUNITY): Payer: Medicare Other | Admitting: Occupational Therapy

## 2015-12-09 ENCOUNTER — Inpatient Hospital Stay (HOSPITAL_COMMUNITY): Payer: Medicare Other

## 2015-12-09 DIAGNOSIS — R52 Pain, unspecified: Secondary | ICD-10-CM

## 2015-12-09 LAB — GLUCOSE, CAPILLARY: GLUCOSE-CAPILLARY: 126 mg/dL — AB (ref 65–99)

## 2015-12-09 NOTE — Plan of Care (Signed)
Problem: RH PAIN MANAGEMENT Goal: RH STG PAIN MANAGED AT OR BELOW PT'S PAIN GOAL <4 on a 0-10 scale  Outcome: Not Progressing Reports pain in abd is "9" and does not ease with pain medication;" medication is essentially worthless"

## 2015-12-09 NOTE — Progress Notes (Signed)
Subjective/Complaints: Patient seen lying in bed this morning. He states he slept well overnight.  ROS-postsurgical abdominal pain, denies chest pain or shortness of breath or nausea or vomiting  Objective: Vital Signs: Blood pressure 117/57, pulse 62, temperature 98.5 F (36.9 C), temperature source Oral, resp. rate 18, height 5\' 11"  (1.803 m), weight 74 kg (163 lb 2.3 oz), SpO2 97 %. No results found. Results for orders placed or performed during the hospital encounter of 11/29/15 (from the past 72 hour(s))  Glucose, capillary     Status: None   Collection Time: 12/06/15 11:39 AM  Result Value Ref Range   Glucose-Capillary 82 65 - 99 mg/dL   Comment 1 Notify RN   Glucose, capillary     Status: None   Collection Time: 12/06/15  4:29 PM  Result Value Ref Range   Glucose-Capillary 95 65 - 99 mg/dL   Comment 1 Notify RN   Glucose, capillary     Status: Abnormal   Collection Time: 12/06/15  9:01 PM  Result Value Ref Range   Glucose-Capillary 103 (H) 65 - 99 mg/dL  Glucose, capillary     Status: Abnormal   Collection Time: 12/07/15  6:43 AM  Result Value Ref Range   Glucose-Capillary 108 (H) 65 - 99 mg/dL  Glucose, capillary     Status: Abnormal   Collection Time: 12/07/15 12:57 PM  Result Value Ref Range   Glucose-Capillary 123 (H) 65 - 99 mg/dL  Glucose, capillary     Status: Abnormal   Collection Time: 12/07/15  5:09 PM  Result Value Ref Range   Glucose-Capillary 105 (H) 65 - 99 mg/dL  Glucose, capillary     Status: Abnormal   Collection Time: 12/07/15  8:31 PM  Result Value Ref Range   Glucose-Capillary 123 (H) 65 - 99 mg/dL  Glucose, capillary     Status: None   Collection Time: 12/08/15  6:32 AM  Result Value Ref Range   Glucose-Capillary 89 65 - 99 mg/dL  Glucose, capillary     Status: None   Collection Time: 12/08/15  7:17 AM  Result Value Ref Range   Glucose-Capillary 90 65 - 99 mg/dL  Glucose, capillary     Status: Abnormal   Collection Time: 12/08/15 11:26 AM   Result Value Ref Range   Glucose-Capillary 100 (H) 65 - 99 mg/dL  Glucose, capillary     Status: Abnormal   Collection Time: 12/08/15  4:23 PM  Result Value Ref Range   Glucose-Capillary 147 (H) 65 - 99 mg/dL      General: No acute distress. Vital signs reviewed. HEENT: Poor dentition, granulation tissue at corner of oral stoma bilaterally Psych: Mood and affect are appropriate Heart: Regular rate and rhythm no rubs murmurs or extra sounds Lungs: Clear to auscultation, breathing unlabored, no rales or wheezes Abdomen: Positive bowel sounds, soft nontender to palpation, nondistended Skin: No evidence of breakdown, no evidence of rash. , Midline abdominal incision packed, C/D/I Neurologic: Alert and oriented 3 Motor strength is 4+/5 in bilateral deltoid, bicep, tricep, grip, hip flexor, knee extensors, ankle dorsiflexor and plantar flexor Musculoskeletal: No edema. No tenderness.  Assessment/Plan: 1. 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 Physiatrist is providing close team supervision and 24 hour management of active medical problems listed below. Physiatrist and rehab team continue to assess barriers to discharge/monitor patient progress toward functional and medical goals.  FIM: Function - Bathing Position: Wheelchair/chair at sink Body parts bathed by patient: Right arm, Left arm, Chest, Front perineal area, Buttocks, Right upper leg, Left upper leg Body parts bathed by helper: Back Bathing not applicable: Abdomen, Right lower leg, Left lower leg Assist Level: Set up Set up : To obtain items  Function- Upper Body Dressing/Undressing Upper body dressing/undressing activity did not occur: Refused (patient complaining of pain, did not want to get OOB) What is the patient wearing?: Pull over  shirt/dress Pull over shirt/dress - Perfomed by patient: Thread/unthread right sleeve, Thread/unthread left sleeve, Put head through opening, Pull shirt over trunk Assist Level: Set up Set up : To obtain clothing/put away Function - Lower Body Dressing/Undressing Lower body dressing/undressing activity did not occur: Refused What is the patient wearing?: Pants Position: Wheelchair/chair at sink Pants- Performed by patient: Thread/unthread right pants leg, Thread/unthread left pants leg, Pull pants up/down Non-skid slipper socks- Performed by helper: Don/doff right sock, Don/doff left sock Assist for footwear: Dependant (per report non-slip socks for safety, applied by staff) Assist for lower body dressing: Set up  Function - Toileting Toileting activity did not occur: No continent bowel/bladder event Toileting steps completed by patient: Adjust clothing prior to toileting, Adjust clothing after toileting Toileting steps completed by helper: Performs perineal hygiene Toileting Assistive Devices: Grab bar or rail Assist level: Touching or steadying assistance (Pt.75%)  Function - Air cabin crew transfer assistive device: Grab bar Assist level to toilet: Supervision or verbal cues Assist level from toilet: Supervision or verbal cues Assist level to bedside commode (at bedside): Touching or steadying assistance (Pt > 75%) Assist level from bedside commode (at bedside): Touching or steadying assistance (Pt > 75%)  Function - Chair/bed transfer Chair/bed transfer method: Stand pivot Chair/bed transfer assist level: Supervision or verbal cues Chair/bed transfer assistive device: Armrests Chair/bed transfer details: Verbal cues for precautions/safety  Function - Locomotion: Wheelchair Will patient use wheelchair at discharge?:  (TBD) Type: Manual Max wheelchair distance: 200 ft Assist Level: Supervision or verbal cues Assist Level: Supervision or verbal cues Assist Level:  Supervision or verbal cues Turns around,maneuvers to table,bed, and toilet,negotiates 3% grade,maneuvers on rugs and over doorsills: No Function - Locomotion: Ambulation Assistive device: Walker-rolling Max distance: 50 Assist level: Supervision or verbal cues Assist level: Supervision or verbal cues Walk 50 feet with 2 turns activity did not occur: Safety/medical concerns Assist level: Supervision or verbal cues Walk 150 feet activity did not occur: Safety/medical concerns Assist level: Supervision or verbal cues Walk 10 feet on uneven surfaces activity did not occur: Safety/medical concerns Assist level: Touching or steadying assistance (Pt > 75%)  Function - Comprehension Comprehension: Auditory Comprehension assist level: Follows basic conversation/direction with no assist  Function - Expression Expression: Verbal Expression assist level: Expresses basic needs/ideas: With no assist  Function - Social Interaction Social Interaction assist level: Interacts appropriately with others - No medications needed.  Function - Problem Solving Problem solving assist level: Solves basic problems with no assist  Function - Memory Memory assist level: Recognizes or recalls 90% of the time/requires cueing < 10% of the time Patient normally able to recall (first 3 days only): Current season, Location of own room, Staff names and faces, That he or she is in a hospital  Medical Problem List and Plan: 1.  Debilitation secondary to respiratory failure/small bowel repair/septic shock Burman Blacksmith pneumonia /multiple medical-stable for SNF when bed available    2.  DVT Prophylaxis/Anticoagulation: Subcutaneous heparin. Monitor for any bleeding episode 3.  Pain Management: Oxycodone as needed, continue to wean  4. Acute blood loss anemia. Follow-up CBC, Hgb 10.8 on 4/4 5. Neuropsych: This patient is capable of making decisions on his own behalf.would like to have neuropsychology look more in detail at  his decision-making capacity as well as evidence of alcohol-induced encephalopathy 6. Skin/Wound Care: Routine skin checks 7. Fluids/Electrolytes/Nutrition: Routine I&O  8. A. fib with RVR/complete heart block. Continue amiodarone.Cardiac rate controlled. 9. Acute on chronic systolic congestive heart failure. Monitor for any signs of fluid overload. Continue Lasix 40 mg daily as directed, no signs of peripheral edema 10. Adrenal insufficiency.completed Solu-Cortef 11. Mood/bipolar disorder.  Lamictal 200 mg daily at bedtime as well as Seroquel 50 mg daily at bedtime and titrate up the home dose of 300 mg as needed.no need for increased dose of Seroquel at the current time 12. Decreased nutritional storage. Advance diet as tolerated.intake 25-100 % meals  13. Alcohol polysubstance abuse. Counseling. Monitor for any signs of withdrawal, neuropsych evaluation 14. Hypokalemia. Continue potassium supplement. 3.7 on 4/4 15. Oral ulcer, both on mucosal and oral commissure,angular chelitis   continue to monitor 16. Hypoalbuminemia Protostat, dietary consult 17. Poor memory, this may be evidence of postoperative versus alcoholic encephalopathy, will ask speech therapy to evaluate  LOS (Days) 10 A FACE TO FACE EVALUATION WAS PERFORMED  Shawn [name] Phenix 12/09/2015, 8:57 AM

## 2015-12-09 NOTE — Progress Notes (Addendum)
Pt reported he was cold this morning and room temp turned up. Called to room by pt noting he was hot. Temp 98.9, BP 80/55, Resp non labored at 16, just feel uncomfortable, blinds closed and room temp turned down to allow air conditioning to come on. Pt denied other complaints, no nausea, pain no better in abd than previously reported. Pain medication is "about useless" per MD, noted MD aware. Reported MD in early this morning to see him. Margarito Liner

## 2015-12-09 NOTE — Progress Notes (Signed)
Occupational Therapy Session Note  Patient Details  Name: Shawn [name] MRN: [id number] Date of Birth: 09-01-1951  Today's Date: 12/09/2015 OT Individual Time: 1200-1230 OT Individual Time Calculation (min): 30 min   Skilled Therapeutic Interventions/Progress Updates: patient seen for w/c to/ fr shower transfer =supervision.   He attempted stepover method, but he stated the movement pulled on his abdominal muscles painfully; as well, he completed seated endurance activities and w/c to bed transfer with supervision     Therapy Documentation Precautions:  Precautions Precautions: Fall Restrictions Weight Bearing Restrictions: No  Pain:denied   See Function Navigator for Current Functional Status.   Therapy/Group: Individual Therapy  Alfredia Ferguson [facility] 12/09/2015, 3:50 PM

## 2015-12-09 NOTE — Progress Notes (Signed)
Patient complaining of abdominal pain. He was noted to have a bowel movement this morning. We will order labs as well as abdominal x-ray.

## 2015-12-10 ENCOUNTER — Inpatient Hospital Stay (HOSPITAL_COMMUNITY): Payer: Medicare Other | Admitting: Physical Therapy

## 2015-12-10 DIAGNOSIS — D62 Acute posthemorrhagic anemia: Secondary | ICD-10-CM

## 2015-12-10 LAB — COMPREHENSIVE METABOLIC PANEL
ALT: 15 U/L — ABNORMAL LOW (ref 17–63)
AST: 23 U/L (ref 15–41)
Albumin: 2.1 g/dL — ABNORMAL LOW (ref 3.5–5.0)
Alkaline Phosphatase: 182 U/L — ABNORMAL HIGH (ref 38–126)
Anion gap: 11 (ref 5–15)
BUN: 13 mg/dL (ref 6–20)
CHLORIDE: 100 mmol/L — AB (ref 101–111)
CO2: 25 mmol/L (ref 22–32)
CREATININE: 1.34 mg/dL — AB (ref 0.61–1.24)
Calcium: 8.3 mg/dL — ABNORMAL LOW (ref 8.9–10.3)
GFR, EST NON AFRICAN AMERICAN: 53 mL/min — AB (ref >60)
Glucose, Bld: 98 mg/dL (ref 65–99)
POTASSIUM: 3.7 mmol/L (ref 3.5–5.1)
Sodium: 136 mmol/L (ref 135–145)
TOTAL PROTEIN: 4.8 g/dL — AB (ref 6.5–8.1)
Total Bilirubin: 0.5 mg/dL (ref 0.3–1.2)

## 2015-12-10 LAB — CBC WITH DIFFERENTIAL/PLATELET
Basophils Absolute: 0.1 10*3/uL (ref 0.0–0.1)
Basophils Relative: 1 %
EOS PCT: 6 %
Eosinophils Absolute: 0.3 10*3/uL (ref 0.0–0.7)
HCT: 30.8 % — ABNORMAL LOW (ref 39.0–52.0)
Hemoglobin: 10.3 g/dL — ABNORMAL LOW (ref 13.0–17.0)
LYMPHS ABS: 1 10*3/uL (ref 0.7–4.0)
LYMPHS PCT: 18 %
MCH: 30.9 pg (ref 26.0–34.0)
MCHC: 33.4 g/dL (ref 30.0–36.0)
MCV: 92.5 fL (ref 78.0–100.0)
Monocytes Absolute: 0.5 10*3/uL (ref 0.1–1.0)
Monocytes Relative: 10 %
Neutro Abs: 3.7 10*3/uL (ref 1.7–7.7)
Neutrophils Relative %: 65 %
PLATELETS: 399 10*3/uL (ref 150–400)
RBC: 3.33 MIL/uL — ABNORMAL LOW (ref 4.22–5.81)
RDW: 15.1 % (ref 11.5–15.5)
WBC: 5.6 10*3/uL (ref 4.0–10.5)

## 2015-12-10 MED ORDER — GERHARDT'S BUTT CREAM
TOPICAL_CREAM | Freq: Two times a day (BID) | CUTANEOUS | Status: DC
  Administered 2015-12-10: 09:00:00 via TOPICAL
  Filled 2015-12-10: qty 1

## 2015-12-10 NOTE — Progress Notes (Signed)
Patient A/O, no noted distress. Tolerated meds well. Administered dressing change, surgical incision pink, looks healthy and surrounding skin WNL. Patient noted he does not want son to visit resulted to son's behavior. Patient use walker to ambulate to bathroom (stand by assist). LBM 12/10/15. Staff will continue to monitor and meet needs.

## 2015-12-10 NOTE — Progress Notes (Signed)
Physical Therapy Session Note  Patient Details  Name: Shawn [name] MRN: [id number] Date of Birth: 04-28-1951  Today's Date: 12/10/2015 PT Individual Time: 0800-0900 PT Individual Time Calculation (min): 60 min   Short Term Goals: Week 2:  PT Short Term Goal 1 (Week 2): =LTG due to estimated LOS  Skilled Therapeutic Interventions/Progress Updates:    Pt received seated in bed eating breakfast, agreeable to treatment. When asked about pain in abdomen, pt reports "Well, it's actually doing a little better today", however continues to rate pain at 8/10. Supine>sit with mod I. Stand pivot transfer to w/c with S. W/c propulsion to gym with S x150', occasional rest breaks due to fatigue. Nustep x7 min with BUE/BLE for strengthening and aerobic endurance. Gait with S and RW 1x180', 1x90'; min cues for upright posture and RW placement. Seated in w/c at sink, performed bathing and dressing with minA for back/buttocks, increased time for all tasks due to fatigue. Returned to bed with S. Remained supine in bed with alarm intact and all needs within reach at completion of session.    Therapy Documentation Precautions:  Precautions Precautions: Fall Restrictions Weight Bearing Restrictions: No Pain: Pain Assessment Pain Assessment: 0-10 Pain Score: 8  Pain Type: Acute pain;Surgical pain Pain Location: Abdomen Pain Orientation: Mid Pain Descriptors / Indicators: Aching Pain Onset: On-going Patients Stated Pain Goal: 4 Pain Intervention(s): Repositioned Multiple Pain Sites: No   See Function Navigator for Current Functional Status.   Therapy/Group: Individual Therapy  Luberta Mutter 12/10/2015, 9:03 AM

## 2015-12-10 NOTE — Progress Notes (Signed)
Subjective/Complaints: Patient seen this morning lying in bed. He states he feels well. When asked about his abdominal pain he states that it is very mild.  ROS- denies chest pain or shortness of breath or nausea or vomiting  Objective: Vital Signs: Blood pressure 112/65, pulse 56, temperature 98.3 F (36.8 C), temperature source Oral, resp. rate 18, height '5\' 11"'$  (1.803 m), weight 69.4 kg (153 lb), SpO2 98 %. Dg Abd Portable 1v  12/09/2015  CLINICAL DATA:  Mid low abdominal pain since patients hernia surgery X 2 weeks ago. Patient states when he rotates his body to the left or right he gets a shooting pain in his abdomen. EXAM: PORTABLE ABDOMEN - 1 VIEW COMPARISON:  CT, 11/17/2015 FINDINGS: Prominent loops of small bowel in the left mid to upper quadrant. Normal caliber colon. No evidence of renal or ureteral stones. Calcifications are noted along the common iliac arteries. Stable changes from the previous ORIF of a left proximal femur fracture. IMPRESSION: 1. Mildly prominent small bowel in the left mid to upper quadrant, nonspecific. This could be due to small bowel inflammation. There is no convincing obstruction. No other evidence of an acute abnormality. Electronically Signed   By: Lajean Manes M.D.   On: 12/09/2015 12:21   Results for orders placed or performed during the hospital encounter of 11/29/15 (from the past 72 hour(s))  Glucose, capillary     Status: Abnormal   Collection Time: 12/07/15 12:57 PM  Result Value Ref Range   Glucose-Capillary 123 (H) 65 - 99 mg/dL  Glucose, capillary     Status: Abnormal   Collection Time: 12/07/15  5:09 PM  Result Value Ref Range   Glucose-Capillary 105 (H) 65 - 99 mg/dL  Glucose, capillary     Status: Abnormal   Collection Time: 12/07/15  8:31 PM  Result Value Ref Range   Glucose-Capillary 123 (H) 65 - 99 mg/dL  Glucose, capillary     Status: None   Collection Time: 12/08/15  6:32 AM  Result Value Ref Range   Glucose-Capillary 89 65 - 99  mg/dL  Glucose, capillary     Status: None   Collection Time: 12/08/15  7:17 AM  Result Value Ref Range   Glucose-Capillary 90 65 - 99 mg/dL  Glucose, capillary     Status: Abnormal   Collection Time: 12/08/15 11:26 AM  Result Value Ref Range   Glucose-Capillary 100 (H) 65 - 99 mg/dL  Glucose, capillary     Status: Abnormal   Collection Time: 12/08/15  4:23 PM  Result Value Ref Range   Glucose-Capillary 147 (H) 65 - 99 mg/dL  Glucose, capillary     Status: Abnormal   Collection Time: 12/09/15 11:48 AM  Result Value Ref Range   Glucose-Capillary 126 (H) 65 - 99 mg/dL  CBC with Differential/Platelet     Status: Abnormal   Collection Time: 12/10/15  4:39 AM  Result Value Ref Range   WBC 5.6 4.0 - 10.5 K/uL   RBC 3.33 (L) 4.22 - 5.81 MIL/uL   Hemoglobin 10.3 (L) 13.0 - 17.0 g/dL   HCT 30.8 (L) 39.0 - 52.0 %   MCV 92.5 78.0 - 100.0 fL   MCH 30.9 26.0 - 34.0 pg   MCHC 33.4 30.0 - 36.0 g/dL   RDW 15.1 11.5 - 15.5 %   Platelets 399 150 - 400 K/uL   Neutrophils Relative % 65 %   Neutro Abs 3.7 1.7 - 7.7 K/uL   Lymphocytes Relative 18 %  Lymphs Abs 1.0 0.7 - 4.0 K/uL   Monocytes Relative 10 %   Monocytes Absolute 0.5 0.1 - 1.0 K/uL   Eosinophils Relative 6 %   Eosinophils Absolute 0.3 0.0 - 0.7 K/uL   Basophils Relative 1 %   Basophils Absolute 0.1 0.0 - 0.1 K/uL  Comprehensive metabolic panel     Status: Abnormal   Collection Time: 12/10/15  4:39 AM  Result Value Ref Range   Sodium 136 135 - 145 mmol/L   Potassium 3.7 3.5 - 5.1 mmol/L   Chloride 100 (L) 101 - 111 mmol/L   CO2 25 22 - 32 mmol/L   Glucose, Bld 98 65 - 99 mg/dL   BUN 13 6 - 20 mg/dL   Creatinine, Ser 1.34 (H) 0.61 - 1.24 mg/dL   Calcium 8.3 (L) 8.9 - 10.3 mg/dL   Total Protein 4.8 (L) 6.5 - 8.1 g/dL   Albumin 2.1 (L) 3.5 - 5.0 g/dL   AST 23 15 - 41 U/L   ALT 15 (L) 17 - 63 U/L   Alkaline Phosphatase 182 (H) 38 - 126 U/L   Total Bilirubin 0.5 0.3 - 1.2 mg/dL   GFR calc non Af Amer 53 (L) >60 mL/min   GFR  calc Af Amer >60 >60 mL/min    Comment: (NOTE) The eGFR has been calculated using the CKD EPI equation. This calculation has not been validated in all clinical situations. eGFR's persistently <60 mL/min signify possible Chronic Kidney Disease.    Anion gap 11 5 - 15     General: No acute distress. Vital signs reviewed. HEENT: Poor dentition, granulation tissue at corner of oral stoma bilaterally Psych: Mood and affect are appropriate Heart: Regular rate and rhythm no rubs murmurs or extra sounds Lungs: Clear to auscultation, breathing unlabored, no rales or wheezes Abdomen: Positive bowel sounds, soft nontender to palpation, nondistended Skin: No evidence of breakdown, no evidence of rash. , Midline abdominal incision packed with dressing, mild serosanguineous drainage, C/I Neurologic: Alert and oriented 3 Motor strength is 4+/5 in bilateral deltoid, bicep, tricep, grip, hip flexor, knee extensors, ankle dorsiflexor and plantar flexor Musculoskeletal: No edema. No tenderness.  Assessment/Plan: 1. 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 Physiatrist is providing close team supervision and 24 hour management of active medical problems listed below. Physiatrist and rehab team continue to assess barriers to discharge/monitor patient progress toward functional and medical goals. FIM: Function - Bathing Position: Wheelchair/chair at sink Body parts bathed by patient: Right arm, Left arm, Chest, Front perineal area, Buttocks, Right upper leg, Left upper leg Body parts bathed by helper: Back Bathing not applicable: Abdomen, Right lower leg, Left lower leg Assist Level: Set up Set up : To obtain items  Function- Upper Body Dressing/Undressing Upper body dressing/undressing activity did not occur: Refused  (patient complaining of pain, did not want to get OOB) What is the patient wearing?: Pull over shirt/dress Pull over shirt/dress - Perfomed by patient: Thread/unthread right sleeve, Thread/unthread left sleeve, Put head through opening, Pull shirt over trunk Assist Level: Set up Set up : To obtain clothing/put away Function - Lower Body Dressing/Undressing Lower body dressing/undressing activity did not occur: Refused What is the patient wearing?: Pants Position: Wheelchair/chair at sink Pants- Performed by patient: Thread/unthread right pants leg, Thread/unthread left pants leg, Pull pants up/down Non-skid slipper socks- Performed by helper: Don/doff right sock, Don/doff  left sock Assist for footwear: Dependant (per report non-slip socks for safety, applied by staff) Assist for lower body dressing: Set up  Function - Toileting Toileting activity did not occur: No continent bowel/bladder event Toileting steps completed by patient: Adjust clothing prior to toileting, Adjust clothing after toileting Toileting steps completed by helper: Performs perineal hygiene Toileting Assistive Devices: Grab bar or rail Assist level: Touching or steadying assistance (Pt.75%)  Function - Toilet Transfers Toilet transfer assistive device: Grab bar Assist level to toilet: Supervision or verbal cues Assist level from toilet: Supervision or verbal cues Assist level to bedside commode (at bedside): Touching or steadying assistance (Pt > 75%) Assist level from bedside commode (at bedside): Touching or steadying assistance (Pt > 75%)  Function - Chair/bed transfer Chair/bed transfer method: Stand pivot Chair/bed transfer assist level: Supervision or verbal cues Chair/bed transfer assistive device: Armrests Chair/bed transfer details: Verbal cues for precautions/safety  Function - Locomotion: Wheelchair Will patient use wheelchair at discharge?:  (TBD) Type: Manual Max wheelchair distance: 200 ft Assist  Level: Supervision or verbal cues Assist Level: Supervision or verbal cues Assist Level: Supervision or verbal cues Turns around,maneuvers to table,bed, and toilet,negotiates 3% grade,maneuvers on rugs and over doorsills: No Function - Locomotion: Ambulation Assistive device: Walker-rolling Max distance: 50 Assist level: Supervision or verbal cues Assist level: Supervision or verbal cues Walk 50 feet with 2 turns activity did not occur: Safety/medical concerns Assist level: Supervision or verbal cues Walk 150 feet activity did not occur: Safety/medical concerns Assist level: Supervision or verbal cues Walk 10 feet on uneven surfaces activity did not occur: Safety/medical concerns Assist level: Touching or steadying assistance (Pt > 75%)  Function - Comprehension Comprehension: Auditory Comprehension assist level: Follows basic conversation/direction with no assist  Function - Expression Expression: Verbal Expression assist level: Expresses basic needs/ideas: With no assist  Function - Social Interaction Social Interaction assist level: Interacts appropriately with others - No medications needed.  Function - Problem Solving Problem solving assist level: Solves basic problems with no assist  Function - Memory Memory assist level: Recognizes or recalls 90% of the time/requires cueing < 10% of the time Patient normally able to recall (first 3 days only): Current season, Location of own room, Staff names and faces, That he or she is in a hospital  Medical Problem List and Plan: 1.  Debilitation secondary to respiratory failure/small bowel repair/septic shock /Klebsiella pneumonia /multiple medical-stable for SNF when bed available   Abdominal x-ray from 4/8  reviewed, suggesting nonspecific changes, potentially bowel inflammation 2.  DVT Prophylaxis/Anticoagulation: Subcutaneous heparin. Monitor for any bleeding episode 3. Pain Management: Oxycodone as needed, continue to wean  4.  Acute blood loss anemia. hemoglobin 10.3 on 4/9 5. Neuropsych: This patient is capable of making decisions on his own behalf.would like to have neuropsychology look more in detail at his decision-making capacity as well as evidence of alcohol-induced encephalopathy 6. Skin/Wound Care: Routine skin checks 7. Fluids/Electrolytes/Nutrition: Routine I&O   Labs reviewed on 4/9, improved, including LFTs. 8. A. fib with RVR/complete heart block. Continue amiodarone.Cardiac rate controlled. 9. Acute on chronic systolic congestive heart failure. Monitor for any signs of fluid overload. Continue Lasix 40 mg daily as directed, no signs of peripheral edema 10. Adrenal insufficiency.completed Solu-Cortef 11. Mood/bipolar disorder.  Lamictal 200 mg daily at bedtime as well as Seroquel 50 mg daily at bedtime and titrate up the home dose of 300 mg as needed.no need for increased dose of Seroquel at the current time 12. Decreased nutritional  storage. Advance diet as tolerated.intake 100 % meals  13. Alcohol polysubstance abuse. Counseling. Monitor for any signs of withdrawal, neuropsych evaluation 14. Hypokalemia. Continue potassium supplement. 3.7 on 4/4 15. Oral ulcer, both on mucosal and oral commissure,angular chelitis   continue to monitor 16. Hypoalbuminemia Protostat, dietary consult 17. Poor memory, this may be evidence of postoperative versus alcoholic encephalopathy, will ask speech therapy to evaluate  LOS (Days) 11 A FACE TO FACE EVALUATION WAS PERFORMED  Ankit [name] 12/10/2015, 7:56 AM

## 2015-12-11 ENCOUNTER — Inpatient Hospital Stay (HOSPITAL_COMMUNITY): Payer: Self-pay | Admitting: Physical Therapy

## 2015-12-11 ENCOUNTER — Inpatient Hospital Stay (HOSPITAL_COMMUNITY): Payer: Self-pay | Admitting: Occupational Therapy

## 2015-12-11 NOTE — Progress Notes (Signed)
Occupational Therapy Note  Patient Details  Name: Shawn [name] MRN: [id number] Date of Birth: Aug 10, 1951  Today's Date: 12/11/2015 OT Individual Time: [id number] OT Individual Time Calculation (min): 9 min   Pt declined OOB activity due to pain 8/10 in abdomen and just ready to d/c.  Pt reports being nausea and vomiting this AM and declining therapy at this time.  Discussed progress towards goals and pt's continued goals to improve in his mobility and ability to provide his personal care without assist from anyone.  Expressed concerns with ultimate d/c planning after SNF.  Notified RN of pain and refusal.  Ehab Humber, [place] 12/11/2015, 10:14 AM

## 2015-12-11 NOTE — Progress Notes (Signed)
Social Work  Discharge Note  The overall goal for the admission was met for:   Discharge location: No-[facility]-SNF  Length of Stay: No-12 DAYS  Discharge activity level: Yes-SUPERVISION/MIN LEVEL  Home/community participation: Yes  Services provided included: MD, RD, PT, OT, SLP, RN, CM, TR, Pharmacy, Neuropsych and SW  Financial Services: Medicare  Follow-up services arranged: Other: NHP  Comments (or additional information):PT FELT SON WOULD NOT ASSIST HIM AT HOME AND FELT BEST OPTION FOR HIM WAS TO GO TO A NH UNTIL HIS ABDOMEN IS HEALED AND HE IS MOVING BETTER. PT REPORTS SON MAY USED MORE OF HIS MONEY THAN HE WAS AUTHORIZED TOO, WANTS NO MORE TO DO WITH HIM AND DOESN'T WANT HIM TO KNOW WHERE HE IS GOING TODAY. HIS SISTER KNOWS, HE TRUSTS HER.  Patient/Family verbalized understanding of follow-up arrangements: Yes  Individual responsible for coordination of the follow-up plan: SELF  Confirmed correct DME delivered: Elease Hashimoto 12/11/2015    Elease Hashimoto

## 2015-12-11 NOTE — Plan of Care (Signed)
Problem: RH Simple Meal Prep Goal: LTG Patient will perform simple meal prep w/assist (OT) LTG: Patient will perform simple meal prep with assistance, with/without cues (OT).  Outcome: Not Met (add Reason) Pt continuously refused participation in IADL tasks

## 2015-12-11 NOTE — Consult Note (Signed)
NEUROCOGNITIVE STATUS EXAMINATION - Shawn [name]   Shawn [name] is a 65 year old man, who was seen for a neurocognitive status examination to evaluate his emotional state and mental status in the setting of sepsis and history of alcohol abuse.  Shawn [name] described his alcohol use history as consuming a fifth of liquor daily for 20-25 years, but with sobriety over the past 2 years.    Emotional Functioning:  During the clinical interview, Shawn [name] acknowledged the presence of both depression and anxiety.  He also stated that he has a diagnosis of bipolar disorder and has had several manic episodes over the course of his lifetime; most recently 2-3 months ago, characterized by feeling "over-excited, as well as increased impulsivity (e.g. extravagant spending)."  He said that he has also had many episodes of depression; most recently, he has felt depressed throughout the duration of this hospitalization.  His depressive symptoms include feelings of sadness, hopelessness, and uselessness, as well as low energy and low motivation.  He also acknowledged suicidal ideation at times in the past, but said that he has never attempted suicide; he denied any recent ideation.  Shawn [name] main concern at this time was dizziness and it is notable that he was throwing up off and on throughout the current session.  Psychologically, he said that he gets frustrated often with his loss of independence, particularly when he has to wait for staff members to help him with things.  When he becomes frustrated, he said that he usually tries to "pray it away," which is a strategy he has utilized in the past and which seems to help.  Shawn [name] noted that he participated in individual psychotherapy in the past and was under the care of a psychiatrist, though he has not done either recently.  His responses to self-report measures of symptoms of mood disruption were suggestive of the presence  of marked anxiety and depression at this time.     Mental Status:  Shawn [name] was not able to complete a formal assessment of cognitive functioning owing to the severity of his dizziness at the time of the appointment.  However an informal assessment was completed wherein he was asked about his hospitalization.  Although he was unable to articulate his medical condition and did not recall exactly how long he had been here or how much longer his hospital stay was planned, he was able to accurately explain the plan for discharging to a skilled nursing facility as well as the reasons for that plan. Subjectively, Shawn [name] denied noticing major cognitive concerns and described his memory as "excellent."    Impressions and Recommendations:  Shawn [name] was unable to complete a formal measure of cognitive functioning.  However, owing to the informal assessment (described above), it is suspected that he would be able to make general decisions regarding his care, but it remains unclear whether he would be able to make intricate or complicated health or financial decisions or whether he would be capable of appropriately managing medications, etc. if left unsupervised.  If time allows prior to his discharge, another attempt at completing a formal cognitive assessment can be scheduled with the neuropsychologist.  From an emotional standpoint, Shawn [name] seems to have major depressive disorder and is experiencing a depressive episode currently.  In addition, his responses to a self-report measure of symptoms of anxiety were concerning as he endorsed symptoms consistent with severe anxious mood.  His physician could consider whether medication adjustments to reduce  anxious mood would be medically indicated at this time.  Furthermore, at discharge, it may be beneficial to refer Shawn [name] both to psychiatry and for individual psychotherapy; he was open to both.  Continued follow-up with the neuropsychologist could be  requested should his treatment team feel that it would be beneficial in informing care.    DIAGNOSES:   Encephalopathy Major Depressive Disorder, recurrent Unspecified Anxiety Disorder  Shawn [name], Psy.D.  Clinical Neuropsychologist

## 2015-12-11 NOTE — Progress Notes (Signed)
Physical Therapy Discharge Summary  Patient Details  Name: Shawn [name] MRN: [id number] Date of Birth: [birth year]  Today's Date: 12/11/2015 PT Individual Time: 0800-0817     PT Individual Minutes: 17 min Missed time: 28 minutes (pt refusal, nausea)   Patient has met 9 of 11 long term goals due to improved activity tolerance, improved balance, improved postural control, increased strength, decreased pain, ability to compensate for deficits, improved awareness and improved coordination.  Patient to discharge at an ambulatory level Supervision.   Patient to discharge to SNF to continue rehab process, as pt does not have family willing/able to provide assist needed for patient safety.  Reasons goals not met: w/c goal unmet due to pt unwillingness to perform w/c propulsion consistently enough to reach modI level, chair <>bed transfer goal unmet due to pt continuing to require S.  Recommendation:  Patient will benefit from ongoing skilled PT services in skilled nursing facility setting to continue to advance safe functional mobility, address ongoing impairments in activity tolerance, strength, balance, safety awareness, and minimize fall risk.  Equipment: No equipment provided  Reasons for discharge: treatment goals met and discharge from hospital  Patient/family agrees with progress made and goals achieved: Yes  PT Discharge Precautions/Restrictions Precautions Precautions: Fall Restrictions Weight Bearing Restrictions: No Pain Pain Assessment Pain Assessment: 0-10 Pain Score: 8  Pain Type: Acute pain Pain Location: Abdomen Pain Orientation: Mid Pain Descriptors / Indicators: Aching Pain Onset: On-going Vision/Perception  Perception Comments: WFL  Cognition Overall Cognitive Status: History of cognitive impairments - at baseline Arousal/Alertness: Awake/alert Orientation Level: Oriented X4 Attention: Selective Selective Attention: Appears intact Memory: Impaired Memory  Impairment: Retrieval deficit Awareness: Appears intact Problem Solving: Impaired Problem Solving Impairment: Verbal basic;Functional basic Safety/Judgment: Appears intact Comments: decreased motivation to participate in therapies  Sensation Sensation Light Touch: Appears Intact Stereognosis: Not tested Hot/Cold: Not tested Proprioception: Appears Intact Coordination Gross Motor Movements are Fluid and Coordinated: Yes Fine Motor Movements are Fluid and Coordinated: Yes Finger Nose Finger Test: [facility] Heel Shin Test: [facility] Motor  Motor Motor: Abnormal postural alignment and control Motor - Discharge Observations: generalized weakness  Mobility Bed Mobility Bed Mobility: Supine to Sit;Sit to Supine Supine to Sit: 6: Modified independent (Device/Increase time) Sit to Supine: 6: Modified independent (Device/Increase time) Transfers Transfers: Yes Stand Pivot Transfers: 5: Supervision Stand Pivot Transfer Details: Verbal cues for precautions/safety;Verbal cues for technique Locomotion  Ambulation Ambulation: Yes Ambulation/Gait Assistance: 5: Supervision Ambulation Distance (Feet): 180 Feet Assistive device: Rolling walker Ambulation/Gait Assistance Details: Verbal cues for precautions/safety;Verbal cues for safe use of DME/AE Gait Gait: Yes Gait Pattern: Impaired Gait Pattern: Decreased stride length;Poor foot clearance - left;Poor foot clearance - right;Trunk flexed;Decreased trunk rotation Gait velocity: decreased for age/gender norms Stairs / Additional Locomotion Stairs: Yes Stairs Assistance: 5: Supervision Stair Management Technique: Two rails;Alternating pattern;Forwards Number of Stairs: 8 Height of Stairs: 6 Architect: Yes Wheelchair Assistance: 5: Investment banker, operational Details: Verbal cues for technique;Verbal cues for Information systems manager: Both upper extremities Wheelchair Parts Management: Needs  assistance Distance: 150  Trunk/Postural Assessment  Cervical Assessment Cervical Assessment: Exceptions to [facility] (forward head posture) Thoracic Assessment Thoracic Assessment: Exceptions to [facility] (increased kyphosis, rounded shoulders) Lumbar Assessment Lumbar Assessment: Within Functional Limits Postural Control Postural Control: Deficits on evaluation (impaired righting reactions and stepping strategies)  Balance Balance Balance Assessed: Yes Static Sitting Balance Static Sitting - Balance Support: No upper extremity supported;Feet supported Static Sitting - Level of Assistance: 6: Modified independent (Device/Increase time) Dynamic  Sitting Balance Dynamic Sitting - Balance Support: No upper extremity supported;Feet supported Dynamic Sitting - Level of Assistance: 6: Modified independent (Device/Increase time) Dynamic Sitting - Balance Activities: Lateral lean/weight shifting;Forward lean/weight shifting;Reaching across midline Static Standing Balance Static Standing - Balance Support: Bilateral upper extremity supported;During functional activity Static Standing - Level of Assistance: 6: Modified independent (Device/Increase time) Dynamic Standing Balance Dynamic Standing - Balance Support: Bilateral upper extremity supported;During functional activity Dynamic Standing - Level of Assistance: 5: Stand by assistance Dynamic Standing - Balance Activities: Lateral lean/weight shifting;Forward lean/weight shifting;Reaching across midline Extremity Assessment  RUE Assessment RUE Assessment: Within Functional Limits LUE Assessment LUE Assessment: Within Functional Limits RLE Assessment RLE Assessment: Within Functional Limits LLE Assessment LLE Assessment: Within Functional Limits  Skilled Therapeutic Intervention:  Pt received seated in bed with c/o nausea/vomiting with RN present administering medication for nausea. Agreeable to treatment although states "I don't know how much I  can do". Supine>sit modI. Gait into/out of bathroom with RW and S x40' total. Performed hygiene and clothing management with minA. Pt seated at sink washes hands with modI. Pt ambulated back to bed and reports he cannot do anything else right now, that he needs to get back in bed despite encouragement to participate. Remained seated in bed at completion of session, alarm intact and all needs within reach.   See Function Navigator for Current Functional Status.  Benjiman Core Tygielski 12/11/2015, 5:08 PM

## 2015-12-11 NOTE — Progress Notes (Signed)
Occupational Therapy Discharge Summary  Patient Details  Name: Shawn [name] MRN: [id number] Date of Birth: [birth year]  Patient has met 7 of 9 long term goals due to improved activity tolerance, improved balance and postural control.  Patient to discharge at overall Supervision level.  Patient's care partner is unavailable to provide the necessary physical assistance at discharge.    Reasons goals not met: N/A  Recommendation:  Patient will benefit from ongoing skilled OT services in skilled nursing facility setting to continue to advance functional skills in the area of BADL, iADL and Reduce care partner burden.  Equipment: No equipment provided  Reasons for discharge: treatment goals met and discharge from hospital  Patient/family agrees with progress made and goals achieved: Yes  OT Discharge Precautions/Restrictions  Precautions Precautions: Fall Pain Pain Assessment Pain Assessment: 0-10 Pain Score: 8  Pain Type: Acute pain Pain Location: Abdomen Pain Orientation: Mid Pain Descriptors / Indicators: Aching Pain Onset: On-going ADL  See Function Navigator Vision/Perception  Vision- History Baseline Vision/History: Wears glasses Wears Glasses: Reading only (and for watching tv) Patient Visual Report: No change from baseline Vision- Assessment Vision Assessment?: No apparent visual deficits Perception Comments: WFL  Cognition Overall Cognitive Status: History of cognitive impairments - at baseline Arousal/Alertness: Awake/alert Orientation Level: Oriented X4 Attention: Selective Selective Attention: Appears intact Memory: Impaired Memory Impairment: Retrieval deficit Awareness: Appears intact Problem Solving: Impaired Problem Solving Impairment: Verbal basic;Functional basic Safety/Judgment: Appears intact Comments: decreased motivation to participate in therapies  Sensation Sensation Light Touch: Appears Intact Stereognosis: Not tested Hot/Cold: Not  tested Proprioception: Appears Intact Coordination Gross Motor Movements are Fluid and Coordinated: Yes Fine Motor Movements are Fluid and Coordinated: Yes Finger Nose Finger Test: [facility] Extremity/Trunk Assessment RUE Assessment RUE Assessment: Within Functional Limits LUE Assessment LUE Assessment: Within Functional Limits   See Function Navigator for Current Functional Status.  Simonne Come 12/11/2015, 3:23 PM
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
## 2015-12-12 NOTE — Progress Notes (Signed)
0800: Pt c/o nausea, small amt emesis. Pt states "nerves."  Zofran 4mg  given.  C/o abd. pain at 0845, Percocet effective, reports Zofran effective. 1000:  Pt d/ced to SNF. Denies any further nausea, pain. VSS. Abd. dressing CDI. Report to facility.

## 2015-12-12 NOTE — Consult Note (Signed)
Note created in error.

## 2016-04-23 ENCOUNTER — Emergency Department (HOSPITAL_COMMUNITY): Payer: Medicare Other

## 2016-04-23 ENCOUNTER — Emergency Department (HOSPITAL_COMMUNITY)
Admission: EM | Admit: 2016-04-23 | Discharge: 2016-04-24 | Disposition: A | Discharge status: Home / Self Care | Payer: Medicare Other | Attending: Emergency Medicine | Admitting: Emergency Medicine

## 2016-04-23 ENCOUNTER — Encounter (HOSPITAL_COMMUNITY): Payer: Self-pay | Admitting: *Deleted

## 2016-04-23 DIAGNOSIS — F10929 Alcohol use, unspecified with intoxication, unspecified: Secondary | ICD-10-CM

## 2016-04-23 DIAGNOSIS — F10129 Alcohol abuse with intoxication, unspecified: Secondary | ICD-10-CM | POA: Insufficient documentation

## 2016-04-23 DIAGNOSIS — Z7982 Long term (current) use of aspirin: Secondary | ICD-10-CM | POA: Insufficient documentation

## 2016-04-23 DIAGNOSIS — W1800XA Striking against unspecified object with subsequent fall, initial encounter: Secondary | ICD-10-CM | POA: Insufficient documentation

## 2016-04-23 DIAGNOSIS — S0091XA Abrasion of unspecified part of head, initial encounter: Secondary | ICD-10-CM | POA: Insufficient documentation

## 2016-04-23 DIAGNOSIS — R4182 Altered mental status, unspecified: Secondary | ICD-10-CM | POA: Insufficient documentation

## 2016-04-23 DIAGNOSIS — W19XXXA Unspecified fall, initial encounter: Secondary | ICD-10-CM

## 2016-04-23 DIAGNOSIS — Y929 Unspecified place or not applicable: Secondary | ICD-10-CM | POA: Diagnosis not present

## 2016-04-23 DIAGNOSIS — S0990XA Unspecified injury of head, initial encounter: Secondary | ICD-10-CM | POA: Diagnosis present

## 2016-04-23 DIAGNOSIS — Z87891 Personal history of nicotine dependence: Secondary | ICD-10-CM | POA: Insufficient documentation

## 2016-04-23 DIAGNOSIS — I5043 Acute on chronic combined systolic (congestive) and diastolic (congestive) heart failure: Secondary | ICD-10-CM | POA: Diagnosis not present

## 2016-04-23 DIAGNOSIS — Z79899 Other long term (current) drug therapy: Secondary | ICD-10-CM | POA: Diagnosis not present

## 2016-04-23 DIAGNOSIS — Y939 Activity, unspecified: Secondary | ICD-10-CM | POA: Diagnosis not present

## 2016-04-23 DIAGNOSIS — Y999 Unspecified external cause status: Secondary | ICD-10-CM | POA: Diagnosis not present

## 2016-04-23 DIAGNOSIS — IMO0002 Reserved for concepts with insufficient information to code with codable children: Secondary | ICD-10-CM

## 2016-04-23 DIAGNOSIS — S0031XA Abrasion of nose, initial encounter: Secondary | ICD-10-CM | POA: Insufficient documentation

## 2016-04-23 LAB — COMPREHENSIVE METABOLIC PANEL
ALBUMIN: 3.7 g/dL (ref 3.5–5.0)
ALK PHOS: 102 U/L (ref 38–126)
ALT: 18 U/L (ref 17–63)
ANION GAP: 4 — AB (ref 5–15)
AST: 22 U/L (ref 15–41)
BILIRUBIN TOTAL: 0.3 mg/dL (ref 0.3–1.2)
BUN: 12 mg/dL (ref 6–20)
CALCIUM: 8.1 mg/dL — AB (ref 8.9–10.3)
CO2: 24 mmol/L (ref 22–32)
Chloride: 111 mmol/L (ref 101–111)
Creatinine, Ser: 1.06 mg/dL (ref 0.61–1.24)
GLUCOSE: 129 mg/dL — AB (ref 65–99)
POTASSIUM: 3.3 mmol/L — AB (ref 3.5–5.1)
Sodium: 139 mmol/L (ref 135–145)
TOTAL PROTEIN: 6.5 g/dL (ref 6.5–8.1)

## 2016-04-23 LAB — RAPID URINE DRUG SCREEN, HOSP PERFORMED
Amphetamines: NOT DETECTED
BARBITURATES: NOT DETECTED
BENZODIAZEPINES: NOT DETECTED
Cocaine: NOT DETECTED
Opiates: NOT DETECTED
TETRAHYDROCANNABINOL: NOT DETECTED

## 2016-04-23 LAB — URINALYSIS, ROUTINE W REFLEX MICROSCOPIC
Bilirubin Urine: NEGATIVE
GLUCOSE, UA: NEGATIVE mg/dL
Hgb urine dipstick: NEGATIVE
KETONES UR: NEGATIVE mg/dL
LEUKOCYTES UA: NEGATIVE
Nitrite: NEGATIVE
PROTEIN: NEGATIVE mg/dL
Specific Gravity, Urine: <1.005 — ABNORMAL LOW (ref 1.005–1.030)
pH: 6 (ref 5.0–8.0)

## 2016-04-23 LAB — LACTIC ACID, PLASMA: Lactic Acid, Venous: 1.7 mmol/L (ref 0.5–1.9)

## 2016-04-23 LAB — ETHANOL: Alcohol, Ethyl (B): 289 mg/dL — ABNORMAL HIGH (ref <5)

## 2016-04-23 LAB — CBC WITH DIFFERENTIAL/PLATELET
Basophils Absolute: 0 10*3/uL (ref 0.0–0.1)
Basophils Relative: 1 %
Eosinophils Absolute: 0 10*3/uL (ref 0.0–0.7)
Eosinophils Relative: 1 %
HEMATOCRIT: 39.6 % (ref 39.0–52.0)
HEMOGLOBIN: 12.5 g/dL — AB (ref 13.0–17.0)
LYMPHS ABS: 0.6 10*3/uL — AB (ref 0.7–4.0)
LYMPHS PCT: 7 %
MCH: 27.9 pg (ref 26.0–34.0)
MCHC: 31.6 g/dL (ref 30.0–36.0)
MCV: 88.4 fL (ref 78.0–100.0)
MONO ABS: 0.5 10*3/uL (ref 0.1–1.0)
MONOS PCT: 5 %
NEUTROS ABS: 7.2 10*3/uL (ref 1.7–7.7)
NEUTROS PCT: 86 %
Platelets: 235 10*3/uL (ref 150–400)
RBC: 4.48 MIL/uL (ref 4.22–5.81)
RDW: 15.2 % (ref 11.5–15.5)
WBC: 8.4 10*3/uL (ref 4.0–10.5)

## 2016-04-23 LAB — TROPONIN I

## 2016-04-23 MED ORDER — SODIUM CHLORIDE 0.9 % IV BOLUS (SEPSIS)
1000.0000 mL | Freq: Once | INTRAVENOUS | Status: AC
  Administered 2016-04-23: 1000 mL via INTRAVENOUS
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
## 2016-04-24 LAB — TROPONIN I: Troponin I: <0.03 ng/mL (ref <0.03)

## 2016-04-24 MED ORDER — ACETAMINOPHEN 325 MG PO TABS
650.0000 mg | ORAL_TABLET | Freq: Once | ORAL | Status: AC
  Administered 2016-04-24: 650 mg via ORAL
  Filled 2016-04-24: qty 2

## 2016-04-24 MED ORDER — SODIUM CHLORIDE 0.9 % IV BOLUS (SEPSIS)
1000.0000 mL | Freq: Once | INTRAVENOUS | Status: AC
  Administered 2016-04-24: 1000 mL via INTRAVENOUS

## 2016-04-24 NOTE — ED Notes (Signed)
Case social worker in room speaking with pt.

## 2016-04-24 NOTE — ED Notes (Signed)
[place] office to see if they could pick pt up from hospital and escort pt to house to pick up a few items and then brought to hotel.  Deputy reported that they are unable to fulfill this service today because of high call volume today.  Dr.  Dayna Barker informed.

## 2016-04-24 NOTE — ED Notes (Signed)
Per Kara,(SW) Nira Conn is coming to see the Pt.

## 2016-04-24 NOTE — NC FL2 (Signed)
[place] LEVEL OF CARE SCREENING TOOL     IDENTIFICATION  Patient Name: Shawn [name] Birthdate: Apr 27, 1951 Sex: male Admission Date (Current Location): 04/23/2016  [facility] and [place] Number:  [employer] and Address:         Provider Number: [id number]  Attending Physician Name and Address:  No att. providers found  Relative Name and Phone Number:       Current Level of Care: Hospital Recommended Level of Care: [place] Prior Approval Number:    Date Approved/Denied:   PASRR Number:    Discharge Plan: Other (Comment) ([place])    Current Diagnoses: Patient Active Problem List   Diagnosis Date Noted  . Pain   . Debility 11/29/2015  . Debilitated 11/29/2015  . Paroxysmal atrial fibrillation (HCC)   . Acute on chronic systolic heart failure ([place])   . Adrenal insufficiency ([place])   . Alcohol abuse   . 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 Polysubstance abuse   . Bipolar affective disorder in remission ([place])   . History of ventilator dependency ([place])   . AKI (acute kidney injury) ([place])   . Dysphagia   . Tachycardia   . Tachypnea   . Hypokalemia   . Leukocytosis   . Acute blood loss anemia   . Acute respiratory failure ([place])   . Persistent atrial fibrillation ([place])   . Acute on chronic combined systolic and diastolic CHF (congestive heart failure) ([place])   . Acute respiratory failure with hypoxemia ([place])   . Cardiomyopathy, ischemic 11/19/2015  . Complete heart block ([place]) 11/18/2015  . Malnutrition of moderate degree 11/18/2015  . Pressure ulcer 11/17/2015  . Septic shock ([place]) 11/17/2015  . Pneumatosis coli 11/17/2015  . Gastroenteritis due to norovirus 11/25/2013  . Other pancytopenia ([place]) 11/25/2013  . Dehydration 11/20/2013  . MDD (major depressive disorder) ([place]) 11/17/2013  . Depression 11/17/2013  . Fall 11/16/2013  . Bipolar affective disorder, current episode mixed ([place]) 04/12/2013  . Alcohol abuse with  intoxication ([place]) 04/12/2013  . Alcohol dependence ([place]) 04/12/2013  . Bipolar I disorder, most recent episode depressed ([place]) 04/12/2013  . Constipation 08/08/2012  . Hip fracture, left ([place]) 08/06/2012  . Traumatic hematoma of buttock 08/06/2012  . ETOH abuse 08/06/2012  . H/O: substance abuse 08/06/2012  . Tobacco abuse 08/06/2012  . History of hernia repair 08/06/2012  . Hip fracture, intertrochanteric ([place]) 08/06/2012  . Bipolar disorder, current episode mixed, mild ([place]) 02/04/2012  . Health maintenance examination 10/24/2011  . Hypoxia 10/24/2011  . Prostate cancer screening 09/19/2011  . Tobacco dependence 09/08/2011  . Erectile dysfunction 08/06/2011  . Osteoarthritis of left knee 08/06/2011  . Elevated blood pressure reading without diagnosis of hypertension 08/06/2011  . History of depression 08/06/2011    Orientation RESPIRATION BLADDER Height & Weight     Self, Time, Situation, Place  Normal Continent Weight: 185 lb (83.9 kg) Height:  6' (182.9 cm)  BEHAVIORAL SYMPTOMS/MOOD NEUROLOGICAL BOWEL NUTRITION STATUS      Continent    AMBULATORY STATUS COMMUNICATION OF NEEDS Skin   Independent Verbally Other (Comment) (Multiple scattered skin abrasions)                       Personal Care Assistance Level of Assistance  Bathing, Feeding, Dressing Bathing Assistance: Independent Feeding assistance: Independent Dressing Assistance: Independent     Functional Limitations Info  Sight, Hearing, Speech Sight Info: Adequate Hearing Info: Adequate Speech Info: Adequate  SPECIAL CARE FACTORS FREQUENCY                       Contractures Contractures Info: Not present    Additional Factors Info  Allergies   Allergies Info: Codeine, Depakote            Current Medications (04/24/2016):  This is the current hospital active medication list No current facility-administered medications for this encounter.    Current Outpatient Prescriptions   Medication Sig Dispense Refill  . albuterol (PROVENTIL) (2.5 MG/3ML) 0.083% nebulizer solution Take 3 mLs (2.5 mg total) by nebulization every 3 (three) hours as needed for wheezing. 75 mL 0  . amiodarone (PACERONE) 400 MG tablet Take 1 tablet (400 mg total) by mouth 2 (two) times daily.    Marland Kitchen aspirin 81 MG chewable tablet Chew 1 tablet (81 mg total) by mouth daily.    Marland Kitchen dicyclomine (BENTYL) 10 MG capsule Take 10 mg by mouth 4 (four) times daily.    Marland Kitchen escitalopram (LEXAPRO) 10 MG tablet Take 10 mg by mouth at bedtime.    . folic acid (FOLVITE) 1 MG tablet Take 1 mg by mouth daily.    . furosemide (LASIX) 40 MG tablet Take 1 tablet (40 mg total) by mouth daily. 30 tablet   . hydroxypropyl methylcellulose (ISOPTO TEARS) 2.5 % ophthalmic solution Place 2 drops into both eyes as needed for dry eyes.    Marland Kitchen lamoTRIgine (LAMICTAL) 200 MG tablet Take 200 mg by mouth at bedtime.    . Multiple Vitamin (MULTIVITAMIN WITH MINERALS) TABS tablet Take 1 tablet by mouth daily.    . potassium chloride SA (K-DUR,KLOR-CON) 20 MEQ tablet Take 2 tablets (40 mEq total) by mouth daily.    . QUEtiapine (SEROQUEL) 50 MG tablet Take 1 tablet (50 mg total) by mouth at bedtime.    . thiamine 100 MG tablet Take 1 tablet (100 mg total) by mouth daily.       Discharge Medications: Please see discharge summary for a list of discharge medications.  Relevant Imaging Results:  Relevant Lab Results:   Additional Information SSN [id number]  Ihor Gully, LCSW

## 2016-04-24 NOTE — ED Provider Notes (Signed)
Signed out pending SW consult. SW discussed with the patient's son and he agrees to let the patient back to the house. I discussed with the sheriff's department who agrees to help escort the patient to the house to get his belongings but can not do it right now. Patient really wants to leave now so we will get a cab voucher he will get his personal belongings from the house and likely reside in a motel to find a more permanent situation. Son and father are in agreement with this plan however they both recommend that is not ideal but do contract for safety at this time.   Merrily Pew, MD 04/25/16 [phone]

## 2016-04-24 NOTE — ED Notes (Signed)
Pt given cab voucher to go home, pt will then go to motel.

## 2016-04-24 NOTE — ED Notes (Signed)
Pt made aware to return if symptoms worsen or if any life threatening symptoms occur.   
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
## 2016-05-09 ENCOUNTER — Encounter (HOSPITAL_COMMUNITY): Payer: Self-pay

## 2016-05-09 ENCOUNTER — Emergency Department (HOSPITAL_COMMUNITY)
Admission: EM | Admit: 2016-05-09 | Discharge: 2016-05-10 | Disposition: A | Discharge status: Home / Self Care | Payer: Medicare Other | Attending: Emergency Medicine | Admitting: Emergency Medicine

## 2016-05-09 DIAGNOSIS — S0990XA Unspecified injury of head, initial encounter: Secondary | ICD-10-CM | POA: Diagnosis not present

## 2016-05-09 DIAGNOSIS — Z87891 Personal history of nicotine dependence: Secondary | ICD-10-CM | POA: Diagnosis not present

## 2016-05-09 DIAGNOSIS — W0110XA Fall on same level from slipping, tripping and stumbling with subsequent striking against unspecified object, initial encounter: Secondary | ICD-10-CM | POA: Insufficient documentation

## 2016-05-09 DIAGNOSIS — Y939 Activity, unspecified: Secondary | ICD-10-CM | POA: Insufficient documentation

## 2016-05-09 DIAGNOSIS — Y92009 Unspecified place in unspecified non-institutional (private) residence as the place of occurrence of the external cause: Secondary | ICD-10-CM | POA: Diagnosis not present

## 2016-05-09 DIAGNOSIS — S0083XA Contusion of other part of head, initial encounter: Secondary | ICD-10-CM | POA: Diagnosis not present

## 2016-05-09 DIAGNOSIS — Y999 Unspecified external cause status: Secondary | ICD-10-CM | POA: Insufficient documentation

## 2016-05-09 DIAGNOSIS — S0011XA Contusion of right eyelid and periocular area, initial encounter: Secondary | ICD-10-CM | POA: Insufficient documentation

## 2016-05-09 DIAGNOSIS — Z7982 Long term (current) use of aspirin: Secondary | ICD-10-CM | POA: Insufficient documentation

## 2016-05-09 DIAGNOSIS — S20211A Contusion of right front wall of thorax, initial encounter: Secondary | ICD-10-CM | POA: Insufficient documentation

## 2016-05-09 DIAGNOSIS — W19XXXA Unspecified fall, initial encounter: Secondary | ICD-10-CM

## 2016-05-09 DIAGNOSIS — S299XXA Unspecified injury of thorax, initial encounter: Secondary | ICD-10-CM | POA: Diagnosis present

## 2016-05-09 NOTE — ED Triage Notes (Signed)
Patient has had recent falls. Patient fell at unknown time this evening and hit his head, face and chest. c-collar applied with triage.  Heavy ETOH abuse

## 2016-05-10 ENCOUNTER — Emergency Department (HOSPITAL_COMMUNITY): Payer: Medicare Other

## 2016-05-10 MED ORDER — ADULT MULTIVITAMIN W/MINERALS CH: 1.0000 | ORAL_TABLET | Freq: Every day | ORAL | 1 refills | Status: DC

## 2016-05-10 MED ORDER — FOLIC ACID 1 MG PO TABS: 1.0000 mg | ORAL_TABLET | Freq: Every day | ORAL | 1 refills | Status: DC

## 2016-05-10 MED ORDER — POTASSIUM CHLORIDE CRYS ER 20 MEQ PO TBCR: 40.0000 meq | EXTENDED_RELEASE_TABLET | Freq: Every day | ORAL | Status: DC

## 2016-05-10 MED ORDER — POLYVINYL ALCOHOL 1.4 % OP SOLN
2.0000 [drp] | OPHTHALMIC | Status: DC | PRN
  Filled 2016-05-10: qty 15

## 2016-05-10 MED ORDER — LAMOTRIGINE 25 MG PO TABS: 200.0000 mg | ORAL_TABLET | Freq: Every day | ORAL | Status: DC

## 2016-05-10 MED ORDER — QUETIAPINE FUMARATE 50 MG PO TABS: 50.0000 mg | ORAL_TABLET | Freq: Every day | ORAL | 1 refills | Status: DC

## 2016-05-10 MED ORDER — ALBUTEROL SULFATE (2.5 MG/3ML) 0.083% IN NEBU
2.5000 mg | INHALATION_SOLUTION | RESPIRATORY_TRACT | 0 refills | Status: DC | PRN
Start: 2016-05-10 — End: 2017-01-20

## 2016-05-10 MED ORDER — ONDANSETRON 4 MG PO TBDP
4.0000 mg | ORAL_TABLET | Freq: Once | ORAL | Status: AC
  Administered 2016-05-10: 4 mg via ORAL
  Filled 2016-05-10: qty 1

## 2016-05-10 MED ORDER — VITAMIN B-1 100 MG PO TABS: 100.0000 mg | ORAL_TABLET | Freq: Every day | ORAL | Status: DC

## 2016-05-10 MED ORDER — LAMOTRIGINE 200 MG PO TABS: 200.0000 mg | ORAL_TABLET | Freq: Every day | ORAL | 1 refills | Status: DC

## 2016-05-10 MED ORDER — FUROSEMIDE 40 MG PO TABS: 40.0000 mg | ORAL_TABLET | Freq: Every day | ORAL | Status: DC

## 2016-05-10 MED ORDER — HYPROMELLOSE (GONIOSCOPIC) 2.5 % OP SOLN: 2.0000 [drp] | OPHTHALMIC | 1 refills | Status: DC | PRN

## 2016-05-10 MED ORDER — ESCITALOPRAM OXALATE 10 MG PO TABS
10.0000 mg | ORAL_TABLET | Freq: Every day | ORAL | 1 refills | Status: DC
Start: 2016-05-10 — End: 2017-01-20

## 2016-05-10 MED ORDER — QUETIAPINE FUMARATE 100 MG PO TABS: 50.0000 mg | ORAL_TABLET | Freq: Every day | ORAL | Status: DC

## 2016-05-10 MED ORDER — ALBUTEROL SULFATE (2.5 MG/3ML) 0.083% IN NEBU
2.5000 mg | INHALATION_SOLUTION | RESPIRATORY_TRACT | Status: DC | PRN
  Administered 2016-05-10: 2.5 mg via RESPIRATORY_TRACT
  Filled 2016-05-10: qty 3

## 2016-05-10 MED ORDER — FOLIC ACID 1 MG PO TABS: 1.0000 mg | ORAL_TABLET | Freq: Every day | ORAL | Status: DC

## 2016-05-10 MED ORDER — DICYCLOMINE HCL 10 MG PO CAPS: 10.0000 mg | ORAL_CAPSULE | Freq: Four times a day (QID) | ORAL | 1 refills | Status: DC

## 2016-05-10 MED ORDER — THIAMINE HCL 100 MG PO TABS: 100.0000 mg | ORAL_TABLET | Freq: Every day | ORAL | 1 refills | Status: DC

## 2016-05-10 MED ORDER — DICYCLOMINE HCL 10 MG PO CAPS: 10.0000 mg | ORAL_CAPSULE | Freq: Four times a day (QID) | ORAL | Status: DC

## 2016-05-10 MED ORDER — ESCITALOPRAM OXALATE 10 MG PO TABS
10.0000 mg | ORAL_TABLET | Freq: Every day | ORAL | Status: DC
  Filled 2016-05-10 (×3): qty 1

## 2016-05-10 MED ORDER — ASPIRIN 81 MG PO CHEW: 81.0000 mg | CHEWABLE_TABLET | Freq: Every day | ORAL | Status: DC

## 2016-05-10 MED ORDER — FUROSEMIDE 40 MG PO TABS: 40.0000 mg | ORAL_TABLET | Freq: Every day | ORAL | 1 refills | Status: DC

## 2016-05-10 MED ORDER — ADULT MULTIVITAMIN W/MINERALS CH: 1.0000 | ORAL_TABLET | Freq: Every day | ORAL | Status: DC

## 2016-05-10 MED ORDER — HYDROCODONE-ACETAMINOPHEN 5-325 MG PO TABS
2.0000 | ORAL_TABLET | Freq: Once | ORAL | Status: AC
  Administered 2016-05-10: 2 via ORAL
  Filled 2016-05-10: qty 2

## 2016-05-10 MED ORDER — ASPIRIN 81 MG PO CHEW: 81.0000 mg | CHEWABLE_TABLET | Freq: Every day | ORAL | 1 refills | Status: DC

## 2016-05-10 MED ORDER — AMIODARONE HCL 200 MG PO TABS
400.0000 mg | ORAL_TABLET | Freq: Two times a day (BID) | ORAL | Status: DC
  Administered 2016-05-10: 400 mg via ORAL
  Filled 2016-05-10 (×9): qty 2

## 2016-05-10 MED ORDER — POTASSIUM CHLORIDE CRYS ER 20 MEQ PO TBCR: 40.0000 meq | EXTENDED_RELEASE_TABLET | Freq: Every day | ORAL | 1 refills | Status: DC

## 2016-05-10 MED ORDER — AMIODARONE HCL 400 MG PO TABS
400.0000 mg | ORAL_TABLET | Freq: Two times a day (BID) | ORAL | 1 refills | Status: DC
Start: 2016-05-10 — End: 2017-01-20

## 2016-05-10 NOTE — Clinical Social Work Note (Addendum)
CSW has had patient for same issues during previous ED visit.  Patient is NOT homeless. Patient continues to live with his son. Patient is not happy in his living situation, however this is largely due to his alcohol use, not due to any maltreatment. Patient continues to work with Katherine Mantle at [facility] in locating independent housing.  CSW spoke with patient's son who indicated that patient purchased several fifths of alcohol last night. Due to excess use of alcohol, patient fell and son was not able to get him up due to his own disabilities. Patient can return to his home.  Son advised that he and friends would come to pick patient up shortly. CSW spoke with Katherine Mantle who stated that she would visit with patient on Monday and continue to work on locating him independent housing. Patient declined alcohol treatment referral.  CSW signing off.    Dilynn Munroe, Clydene Pugh, LCSW

## 2016-05-10 NOTE — ED Notes (Signed)
Went in to give discharge papers and pt stated he did not have anywhere to go; social work consult order placed

## 2016-05-10 NOTE — ED Provider Notes (Signed)
By signing my name below, I, Shawn [name], attest that this documentation has been prepared under the direction and in the presence of [name], DO. Electronically Signed: Irene [name], ED Scribe. 05/10/16. 12:25 AM.  TIME SEEN: 12:20 AM  CHIEF COMPLAINT:  Chief Complaint  Patient presents with  . Fall    HPI:  HPI Comments: Shawn [name] is a 65 y.o. Male with a hx of osteoarthritis, alcohol abuse, and substance abuse who presents to the Emergency Department complaining of a fall occurring at an unknown time. Pt believes that the fall occurred "some time this afternoon." When asked why he waited so long to come to the ED, he replies, "I just kept putting it off."  Pt fell and hit his head, face, and chest.States he tripped over something in his house and that is why he fell. Pt reports associated right rib pain and frontal headache. Pt is wearing a C-Collar in the ED. Pt denies ETOH use this evening. Pt denies LOC, abdominal pain, Extremity pain, neck or back pain, nausea, or vomiting, shortness of breath, numbness or focal weakness. Pt is not on blood thinners.    ROS: See HPI Constitutional: no fever  Eyes: no drainage  ENT: no runny nose   Cardiovascular:  chest pain  Resp: no SOB  GI: no vomiting GU: no dysuria Integumentary: no rash  Allergy: no hives  Musculoskeletal: no leg swelling  Neurological: no slurred speech ROS otherwise negative  PAST MEDICAL HISTORY/PAST SURGICAL HISTORY:  Past Medical History:  Diagnosis Date  . Alcoholism ([place])    "Dry" since 2002; relapse 2014  . Anxiety   . Anxiety and depression   . Bipolar disorder [facility])    Has had multiple psych hospitalization in the past, most recently at [facility] 01/31/11-02/04/11.  Says meds made him emotionally blunted. (lamictal and wellbutrin).  . Diverticular disease    "itis" x 2 episodes (last was about 2007)  . Fracture of metatarsal bone(s), closed    3rd, 4th, 5th mid shaft on left  foot  . History of septic shock 11/2015   ? due to small bowel ischemia: hospitalized + surgeries 11/2015  . History of substance abuse    cocaine, marijuana, acid, alcohol (in remission since 2010)  . Multiple rib fractures 10/09/10   s/p fall down a ravine--9th and 10th on right, contusion of left 7th and 8th ribs  . Osteoarthritis X years   left knee.  Distant hx (age 77) "dislocated" knee and required surgery, then crush injury to patella required knee cap removal.  . Solar dermatitis    face and ears  . Tobacco dependence   . Vitamin B12 deficiency 10/2013   IF ab positive.  Replacement therapy started end of March 2015    MEDICATIONS:  Prior to Admission medications   Medication Sig Start Date End Date Taking? Authorizing Provider  albuterol (PROVENTIL) (2.5 MG/3ML) 0.083% nebulizer solution Take 3 mLs (2.5 mg total) by nebulization every 3 (three) hours as needed for wheezing. 11/29/15   Thurnell Lose, MD  amiodarone (PACERONE) 400 MG tablet Take 1 tablet (400 mg total) by mouth 2 (two) times daily. 11/29/15   Thurnell Lose, MD  aspirin 81 MG chewable tablet Chew 1 tablet (81 mg total) by mouth daily. 11/29/15   Thurnell Lose, MD  dicyclomine (BENTYL) 10 MG capsule Take 10 mg by mouth 4 (four) times daily.    Historical Provider, MD  escitalopram (LEXAPRO) 10 MG  tablet Take 10 mg by mouth at bedtime.    Historical Provider, MD  folic acid (FOLVITE) 1 MG tablet Take 1 mg by mouth daily.    Historical Provider, MD  furosemide (LASIX) 40 MG tablet Take 1 tablet (40 mg total) by mouth daily. 11/29/15   Thurnell Lose, MD  hydroxypropyl methylcellulose (ISOPTO TEARS) 2.5 % ophthalmic solution Place 2 drops into both eyes as needed for dry eyes.    Historical Provider, MD  lamoTRIgine (LAMICTAL) 200 MG tablet Take 200 mg by mouth at bedtime.    Historical Provider, MD  Multiple Vitamin (MULTIVITAMIN WITH MINERALS) TABS tablet Take 1 tablet by mouth daily.    Historical Provider, MD   potassium chloride SA (K-DUR,KLOR-CON) 20 MEQ tablet Take 2 tablets (40 mEq total) by mouth daily. 11/29/15   Thurnell Lose, MD  QUEtiapine (SEROQUEL) 50 MG tablet Take 1 tablet (50 mg total) by mouth at bedtime. 11/29/15   Thurnell Lose, MD  thiamine 100 MG tablet Take 1 tablet (100 mg total) by mouth daily. 11/29/15   Thurnell Lose, MD    ALLERGIES:  Allergies  Allergen Reactions  . Codeine Nausea Only and Other (See Comments)    flushing  . Depakote [Divalproex Sodium] Other (See Comments)    Makes me crazy    SOCIAL HISTORY:  Social History  Substance Use Topics  . Smoking status: Former Smoker    Packs/day: 1.00    Years: 30.00    Types: Cigarettes    Quit date: 12/30/2014  . Smokeless tobacco: Never Used  . Alcohol use Yes     Comment: daily 6 beers and 1 pint of vodka  11/28/2015   " I quit drinking 3 years ago "    FAMILY HISTORY: Family History  Problem Relation Age of Onset  . Arthritis Mother   . Arthritis Father   . Cancer Brother     lung cancer.  Died in early 79s.    EXAM: BP 128/74 (BP Location: Left Arm)   Pulse 82   Temp 97.8 F (36.6 C) (Oral)   Resp 22   Ht 6' (1.829 m)   Wt 167 lb (75.8 kg)   SpO2 90%   BMI 22.65 kg/m  CONSTITUTIONAL: Alert and oriented and responds appropriately to questions. Chronically ill appearing; well-nourished; GCS 15 HEAD: Normocephalic; ecchymosis to right forehead and right upper eyelid; abrasion to right eyebrow and right nose EYES: Conjunctivae clear, PERRL, EOMI ENT: normal nose; no rhinorrhea; moist mucous membranes; pharynx without lesions noted; no dental injury; no septal hematoma NECK: Cervical collar in place; Supple, no meningismus, no LAD; no midline spinal tenderness, step-off or deformity CARD: RRR; S1 and S2 appreciated; no murmurs, no clicks, no rubs, no gallops RESP: Normal chest excursion without splinting or tachypnea; breath sounds clear and equal bilaterally; no wheezes, no rhonchi, no  rales; no hypoxia or respiratory distress CHEST:  chest wall stable, no crepitus or ecchymosis or deformity, TTP over the right chest wall ABD/GI: Normal bowel sounds; non-distended; soft, non-tender, no rebound, no guarding PELVIS:  stable, nontender to palpation BACK:  The back appears normal and is non-tender to palpation, there is no CVA tenderness; no midline spinal tenderness, step-off or deformity EXT: Normal ROM in all joints; non-tender to palpation; no edema; normal capillary refill; no cyanosis, no bony tenderness or bony deformity of patient's extremities, no joint effusion, no ecchymosis or lacerations    SKIN: Normal color for age and race; warm NEURO:  Moves all extremities equally, sensation to light touch intact diffusely, cranial nerves II through XII intact PSYCH: The patient's mood and manner are appropriate. Grooming and personal hygiene are appropriate.  MEDICAL DECISION MAKING: Patient here with mechanical fall. Complaining of headache and right chest wall pain. We'll obtain CT of his head, cervical spine, x-rays of his chest. Will give Vicodin for pain.  ED PROGRESS: Imaging shows no acute injury. He does not appear intoxicated. He is neurologically intact. Cervical spine cleared clinically and c-collar removed. He has been ambulatory. Hemodynamically stable. Given his history of substance abuse, I do not feel he should be discharged home on narcotics. Recommended ibuprofen for pain. At this time he does not have a ride home. He will stay in the emergency department until the morning when his son can pick him up or he can take a taxi. He does report to nursing staff that he has not had many of his home medications and "a while". I will give him a refill of his home medications and have strongly encouraged him to follow-up with his primary care provider.  At this time, I do not feel there is any life-threatening condition present. I have reviewed and discussed all results (EKG,  imaging, lab, urine as appropriate), exam findings with patient/family. I have reviewed nursing notes and appropriate previous records.  I feel the patient is safe to be discharged home without further emergent workup and can continue workup as an outpatient as needed. Discussed usual and customary return precautions. Patient/family verbalize understanding and are comfortable with this plan.  Outpatient follow-up has been provided. All questions have been answered.  I personally performed the services described in this documentation, which was scribed in my presence. The recorded information has been reviewed and is accurate.    [place], DO 05/10/16 [phone]

## 2016-05-10 NOTE — ED Notes (Signed)
Pt ambulated with minimal assistance.

## 2017-01-08 ENCOUNTER — Emergency Department (HOSPITAL_COMMUNITY)
Admission: EM | Admit: 2017-01-08 | Discharge: 2017-01-08 | Disposition: A | Discharge status: Home / Self Care | Payer: Medicare Other | Attending: Emergency Medicine | Admitting: Emergency Medicine

## 2017-01-08 ENCOUNTER — Emergency Department (HOSPITAL_COMMUNITY): Payer: Medicare Other

## 2017-01-08 ENCOUNTER — Encounter (HOSPITAL_COMMUNITY): Payer: Self-pay | Admitting: Emergency Medicine

## 2017-01-08 DIAGNOSIS — W06XXXA Fall from bed, initial encounter: Secondary | ICD-10-CM | POA: Diagnosis not present

## 2017-01-08 DIAGNOSIS — S22000A Wedge compression fracture of unspecified thoracic vertebra, initial encounter for closed fracture: Secondary | ICD-10-CM

## 2017-01-08 DIAGNOSIS — Y929 Unspecified place or not applicable: Secondary | ICD-10-CM | POA: Diagnosis not present

## 2017-01-08 DIAGNOSIS — F17210 Nicotine dependence, cigarettes, uncomplicated: Secondary | ICD-10-CM | POA: Diagnosis not present

## 2017-01-08 DIAGNOSIS — Z79899 Other long term (current) drug therapy: Secondary | ICD-10-CM | POA: Diagnosis not present

## 2017-01-08 DIAGNOSIS — R251 Tremor, unspecified: Secondary | ICD-10-CM | POA: Diagnosis not present

## 2017-01-08 DIAGNOSIS — Y9389 Activity, other specified: Secondary | ICD-10-CM | POA: Diagnosis not present

## 2017-01-08 DIAGNOSIS — R296 Repeated falls: Secondary | ICD-10-CM

## 2017-01-08 DIAGNOSIS — Y999 Unspecified external cause status: Secondary | ICD-10-CM | POA: Diagnosis not present

## 2017-01-08 DIAGNOSIS — S22080A Wedge compression fracture of T11-T12 vertebra, initial encounter for closed fracture: Secondary | ICD-10-CM | POA: Insufficient documentation

## 2017-01-08 DIAGNOSIS — R42 Dizziness and giddiness: Secondary | ICD-10-CM | POA: Insufficient documentation

## 2017-01-08 DIAGNOSIS — I509 Heart failure, unspecified: Secondary | ICD-10-CM | POA: Diagnosis not present

## 2017-01-08 DIAGNOSIS — S3992XA Unspecified injury of lower back, initial encounter: Secondary | ICD-10-CM | POA: Diagnosis present

## 2017-01-08 LAB — CBC WITH DIFFERENTIAL/PLATELET
BASOS PCT: 1 %
Basophils Absolute: 0 10*3/uL (ref 0.0–0.1)
EOS ABS: 0.1 10*3/uL (ref 0.0–0.7)
Eosinophils Relative: 1 %
HCT: 47.1 % (ref 39.0–52.0)
HEMOGLOBIN: 17 g/dL (ref 13.0–17.0)
Lymphocytes Relative: 16 %
Lymphs Abs: 0.7 10*3/uL (ref 0.7–4.0)
MCH: 32.9 pg (ref 26.0–34.0)
MCHC: 36.1 g/dL — ABNORMAL HIGH (ref 30.0–36.0)
MCV: 91.3 fL (ref 78.0–100.0)
MONO ABS: 0.3 10*3/uL (ref 0.1–1.0)
MONOS PCT: 6 %
NEUTROS PCT: 76 %
Neutro Abs: 3.3 10*3/uL (ref 1.7–7.7)
PLATELETS: 184 10*3/uL (ref 150–400)
RBC: 5.16 MIL/uL (ref 4.22–5.81)
RDW: 14.6 % (ref 11.5–15.5)
WBC: 4.4 10*3/uL (ref 4.0–10.5)

## 2017-01-08 LAB — COMPREHENSIVE METABOLIC PANEL
ALBUMIN: 3.9 g/dL (ref 3.5–5.0)
ALK PHOS: 108 U/L (ref 38–126)
ALT: 33 U/L (ref 17–63)
AST: 67 U/L — ABNORMAL HIGH (ref 15–41)
Anion gap: 11 (ref 5–15)
BUN: 7 mg/dL (ref 6–20)
CALCIUM: 8.9 mg/dL (ref 8.9–10.3)
CHLORIDE: 103 mmol/L (ref 101–111)
CO2: 23 mmol/L (ref 22–32)
CREATININE: 0.93 mg/dL (ref 0.61–1.24)
GFR calc Af Amer: >60 mL/min (ref >60)
GFR calc non Af Amer: >60 mL/min (ref >60)
GLUCOSE: 104 mg/dL — AB (ref 65–99)
Potassium: 4.4 mmol/L (ref 3.5–5.1)
Sodium: 137 mmol/L (ref 135–145)
Total Bilirubin: 1.3 mg/dL — ABNORMAL HIGH (ref 0.3–1.2)
Total Protein: 7.1 g/dL (ref 6.5–8.1)

## 2017-01-08 LAB — URINALYSIS, ROUTINE W REFLEX MICROSCOPIC
BILIRUBIN URINE: NEGATIVE
GLUCOSE, UA: NEGATIVE mg/dL
Hgb urine dipstick: NEGATIVE
Ketones, ur: NEGATIVE mg/dL
Leukocytes, UA: NEGATIVE
Nitrite: NEGATIVE
Protein, ur: NEGATIVE mg/dL
SPECIFIC GRAVITY, URINE: 1.009 (ref 1.005–1.030)
pH: 5 (ref 5.0–8.0)

## 2017-01-08 LAB — PROTIME-INR
INR: 0.94
Prothrombin Time: 12.6 seconds (ref 11.4–15.2)

## 2017-01-08 LAB — I-STAT TROPONIN, ED: TROPONIN I, POC: 0 ng/mL (ref 0.00–0.08)

## 2017-01-08 LAB — APTT: APTT: 26 s (ref 24–36)

## 2017-01-08 MED ORDER — TRAMADOL HCL 50 MG PO TABS
50.0000 mg | ORAL_TABLET | Freq: Four times a day (QID) | ORAL | 0 refills | Status: DC | PRN
Start: 2017-01-08 — End: 2017-01-20

## 2017-01-08 MED ORDER — FENTANYL CITRATE (PF) 100 MCG/2ML IJ SOLN
25.0000 ug | Freq: Once | INTRAMUSCULAR | Status: AC
  Administered 2017-01-08: 25 ug via INTRAVENOUS
  Filled 2017-01-08: qty 2

## 2017-01-08 NOTE — ED Triage Notes (Addendum)
Pt reports rolled out of bed last night and reports lower back pain ever since. Pt denies being on any blood thinners. Pt reports increased urinary frequency, intermittent dizziness and frequent falls for last several weeks. nad noted. Pt has abrasions noted to right forearm from past falls. Pt alert and oriented. nad noted. Airway patent.

## 2017-01-08 NOTE — ED Notes (Signed)
Ambulated PT pt walked fine using his walker

## 2017-01-08 NOTE — ED Provider Notes (Signed)
[facility] DEPT Provider Note   CSN: [id number] Arrival date & time: 01/08/17  9628   By signing my name below, I, [name], attest that this documentation has been prepared under the direction and in the presence of Julianne Rice, MD. Electronically Signed: [name], Scribe. 01/08/17. 9:40 AM. History   Chief Complaint Chief Complaint  Patient presents with  . Back Pain     The history is provided by the patient. No language interpreter was used.  Back Pain   This is a new problem. The current episode started 6 to 12 hours ago. The pain is associated with falling. The pain does not radiate. The pain is at a severity of 10/10. The pain is severe. The symptoms are aggravated by bending and twisting. Pertinent negatives include no chest pain, no fever, no numbness, no headaches, no abdominal pain, no dysuria and no weakness.  HPI Comments: Shawn [name] is a 66 y.o. male brought in by ambulance, who presents to the Emergency Department complaining of lower back pain s/p fall that occurred last night. He also reports increased frequencyOf falls and dizziness recently. He states that he accidentally rolled out of bed last night. The patient believes he hit the back of his head. He states that he has had frequent falls over the past several weeks. He is not currently on any blood thinners. He denies coughing, hematuria, or vomiting. Denies focal weakness or numbness. States he's had unsteadiness on his feet and tremor.  Past Medical History:  Diagnosis Date  . Alcoholism ([place])    "Dry" since 2002; relapse 2014  . Anxiety   . Anxiety and depression   . Bipolar disorder [facility])    Has had multiple psych hospitalization in the past, most recently at [facility] 01/31/11-02/04/11.  Says meds made him emotionally blunted. (lamictal and wellbutrin).  . Diverticular disease    "itis" x 2 episodes (last was about 2007)  . Fracture of metatarsal bone(s), closed    3rd,  4th, 5th mid shaft on left foot  . History of septic shock 11/2015   ? due to small bowel ischemia: hospitalized + surgeries 11/2015  . History of substance abuse    cocaine, marijuana, acid, alcohol (in remission since 2010)  . Multiple rib fractures 10/09/10   s/p fall down a ravine--9th and 10th on right, contusion of left 7th and 8th ribs  . Osteoarthritis X years   left knee.  Distant hx (age 73) "dislocated" knee and required surgery, then crush injury to patella required knee cap removal.  . Solar dermatitis    face and ears  . Tobacco dependence   . Vitamin B12 deficiency 10/2013   IF ab positive.  Replacement therapy started end of March 2015    Patient Active Problem List   Diagnosis Date Noted  . Pain   . Debility 11/29/2015  . Debilitated 11/29/2015  . Paroxysmal atrial fibrillation (HCC)   . Acute on chronic systolic heart failure ([place])   . Adrenal insufficiency ([place])   . Alcohol abuse   . 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 Polysubstance abuse   . Bipolar affective disorder in remission ([place])   . History of ventilator dependency ([place])   . AKI (acute kidney injury) ([place])   . Dysphagia   . Tachycardia   . Tachypnea   . Hypokalemia   . Leukocytosis   . Acute blood loss anemia   . Acute respiratory failure ([place])   . Persistent atrial fibrillation (  [place])   . Acute on chronic combined systolic and diastolic CHF (congestive heart failure) ([place])   . Acute respiratory failure with hypoxemia ([place])   . Cardiomyopathy, ischemic 11/19/2015  . Complete heart block ([place]) 11/18/2015  . Malnutrition of moderate degree 11/18/2015  . Pressure ulcer 11/17/2015  . Septic shock ([place]) 11/17/2015  . Pneumatosis coli 11/17/2015  . Gastroenteritis due to norovirus 11/25/2013  . Other pancytopenia ([place]) 11/25/2013  . Dehydration 11/20/2013  . MDD (major depressive disorder) 11/17/2013  . Depression 11/17/2013  . Fall 11/16/2013  . Bipolar affective disorder, current episode mixed ([place])  04/12/2013  . Alcohol abuse with intoxication ([place]) 04/12/2013  . Alcohol dependence ([place]) 04/12/2013  . Bipolar I disorder, most recent episode depressed ([place]) 04/12/2013  . Constipation 08/08/2012  . Hip fracture, left ([place]) 08/06/2012  . Traumatic hematoma of buttock 08/06/2012  . ETOH abuse 08/06/2012  . H/O: substance abuse 08/06/2012  . Tobacco abuse 08/06/2012  . History of hernia repair 08/06/2012  . Hip fracture, intertrochanteric ([place]) 08/06/2012  . Bipolar disorder, current episode mixed, mild ([place]) 02/04/2012  . Health maintenance examination 10/24/2011  . Hypoxia 10/24/2011  . Prostate cancer screening 09/19/2011  . Tobacco dependence 09/08/2011  . Erectile dysfunction 08/06/2011  . Osteoarthritis of left knee 08/06/2011  . Elevated blood pressure reading without diagnosis of hypertension 08/06/2011  . History of depression 08/06/2011    Past Surgical History:  Procedure Laterality Date  . BOWEL RESECTION  06/15/2012   Procedure: SMALL BOWEL RESECTION;  Surgeon: Edward Jolly, MD;  Location: [facility];  Service: General;  Laterality: N/A;  . BOWEL RESECTION  11/19/2015   Procedure: SMALL BOWEL RESECTION;  Surgeon: Ralene Ok, MD;  Location: [facility];  Service: General;;  . CARDIAC CATHETERIZATION N/A 11/20/2015   Procedure: Temporary Wire;  Surgeon: Evans Lance, MD;  Location: [facility];  Service: Cardiovascular;  Laterality: N/A;  . FEMUR IM NAIL  08/06/2012   Procedure: INTRAMEDULLARY (IM) NAIL FEMORAL;  Surgeon: Johnn Hai, MD;  Location: [facility];  Service: Orthopedics;  Laterality: Left;  . HERNIA REPAIR     at 66 years of age  23  . INGUINAL HERNIA REPAIR     left  . INGUINAL HERNIA REPAIR  06/15/2012   Procedure: HERNIA REPAIR INGUINAL INCARCERATED;  Surgeon: Edward Jolly, MD;  Location: [facility];  Service: General;  Laterality: Right;  . [place]  . LAPAROTOMY N/A 11/17/2015   Procedure: EXPLORATORY LAPAROTOMY;   Surgeon: Donnie Mesa, MD;  Location: [facility];  Service: General;  Laterality: N/A;  . LAPAROTOMY N/A 11/19/2015   Procedure: EXPLORATORY LAPAROTOMY OPEN ABDOMEN ABDOMINAL WOUND VAC CHANGE;  Surgeon: Ralene Ok, MD;  Location: [place];  Service: General;  Laterality: N/A;  . [place]   s/p crush injury  . SMALL BOWEL REPAIR N/A 11/21/2015   Procedure: SMALL BOWEL REPAIR, SMALL BOWEL ANATAMOSIS AND ABDOMINAL CLOSURE;  Surgeon: Coralie Keens, MD;  Location: [place] [facility];  Service: General;  Laterality: N/A;  . TOTAL KNEE ARTHROPLASTY  11/20/2011   Procedure: TOTAL KNEE ARTHROPLASTY;  Surgeon: Ninetta Lights, MD;  Location: [facility];  Service: Orthopedics;  Laterality: Left;  DR Percell Miller WANTS 90MINUTES FOR THIS CASE       Home Medications    Prior to Admission medications   Medication Sig Start Date End Date Taking? Authorizing Provider  acetaminophen (TYLENOL) 650 MG CR tablet Take 1,300 mg by mouth every 8 (eight) hours  as needed for pain.   Yes [provider]  aspirin EC 81 MG tablet Take 81 mg by mouth daily as needed for moderate pain.   Yes [provider]  Multiple Vitamin (MULTIVITAMIN WITH MINERALS) TABS tablet Take 1 tablet by mouth daily. 05/10/16  Yes Ward, Kristen N, DO  albuterol (PROVENTIL) (2.5 MG/3ML) 0.083% nebulizer solution Take 3 mLs (2.5 mg total) by nebulization every 3 (three) hours as needed for wheezing. Patient not taking: Reported on 01/08/2017 05/10/16   Ward, Delice Bison, DO  amiodarone (PACERONE) 400 MG tablet Take 1 tablet (400 mg total) by mouth 2 (two) times daily. Patient not taking: Reported on 01/08/2017 05/10/16   Ward, Delice Bison, DO  dicyclomine (BENTYL) 10 MG capsule Take 1 capsule (10 mg total) by mouth 4 (four) times daily. Patient not taking: Reported on 01/08/2017 05/10/16   Ward, Delice Bison, DO  escitalopram (LEXAPRO) 10 MG tablet Take 1 tablet (10 mg total) by mouth at bedtime. Patient not taking: Reported on 01/08/2017 05/10/16   Ward, Delice Bison, DO  folic acid (FOLVITE) 1 MG tablet Take 1 tablet (1 mg total) by mouth daily. Patient not taking: Reported on 01/08/2017 05/10/16   Ward, Delice Bison, DO  furosemide (LASIX) 40 MG tablet Take 1 tablet (40 mg total) by mouth daily. Patient not taking: Reported on 01/08/2017 05/10/16   Ward, Delice Bison, DO  hydroxypropyl methylcellulose / hypromellose (ISOPTO TEARS / GONIOVISC) 2.5 % ophthalmic solution Place 2 drops into both eyes as needed for dry eyes. Patient not taking: Reported on 01/08/2017 05/10/16   Ward, Delice Bison, DO  lamoTRIgine (LAMICTAL) 200 MG tablet Take 1 tablet (200 mg total) by mouth at bedtime. Patient not taking: Reported on 01/08/2017 05/10/16   Ward, Delice Bison, DO  potassium chloride SA (K-DUR,KLOR-CON) 20 MEQ tablet Take 2 tablets (40 mEq total) by mouth daily. Patient not taking: Reported on 01/08/2017 05/10/16   Ward, Delice Bison, DO  QUEtiapine (SEROQUEL) 50 MG tablet Take 1 tablet (50 mg total) by mouth at bedtime. Patient not taking: Reported on 01/08/2017 05/10/16   Ward, Delice Bison, DO  thiamine 100 MG tablet Take 1 tablet (100 mg total) by mouth daily. Patient not taking: Reported on 01/08/2017 05/10/16   Ward, Delice Bison, DO  traMADol (ULTRAM) 50 MG tablet Take 1 tablet (50 mg total) by mouth every 6 (six) hours as needed for severe pain. 01/08/17   Julianne Rice, MD    Family History Family History  Problem Relation Age of Onset  . Arthritis Mother   . Arthritis Father   . Cancer Brother        lung cancer.  Died in early 58s.    Social History Social History  Substance Use Topics  . Smoking status: Current Every Day Smoker    Packs/day: 0.50    Years: 30.00    Types: Cigarettes    Last attempt to quit: 12/30/2014  . Smokeless tobacco: Never Used  . Alcohol use Yes     Comment: daily 6 beers and 1 pint of vodka  11/28/2015   " I quit drinking 3 years ago "     Allergies   Codeine and Depakote [divalproex sodium]   Review of Systems Review of Systems  Constitutional:  Negative for chills and fever.  HENT: Negative for facial swelling, sore throat and trouble swallowing.   Eyes: Negative for visual disturbance.  Respiratory: Negative for cough and shortness of breath.   Cardiovascular: Negative for  chest pain, palpitations and leg swelling.  Gastrointestinal: Negative for abdominal pain, diarrhea, nausea and vomiting.  Genitourinary: Negative for difficulty urinating, dysuria, flank pain and hematuria.  Musculoskeletal: Positive for back pain, gait problem, myalgias and neck pain. Negative for arthralgias and neck stiffness.  Skin: Negative for rash.  Neurological: Positive for dizziness and tremors. Negative for weakness, light-headedness, numbness and headaches.  All other systems reviewed and are negative.    Physical Exam Updated Vital Signs BP (!) 141/93 (BP Location: Right Arm)   Pulse 72   Temp 98.1 F (36.7 C) (Oral)   Resp 18   Ht 6' (1.829 m)   Wt 180 lb (81.6 kg)   SpO2 97%   BMI 24.41 kg/m   Physical Exam  Constitutional: He is oriented to person, place, and time. He appears well-developed and well-nourished. No distress.  Chronically ill-appearing  HENT:  Head: Normocephalic and atraumatic.  Mouth/Throat: Oropharynx is clear and moist. No oropharyngeal exudate.  Eyes: EOM are normal. Pupils are equal, round, and reactive to light.  Neck: Normal range of motion. Neck supple.  No posterior midline cervical tenderness to palpation.  Cardiovascular: Normal rate, regular rhythm, normal heart sounds and intact distal pulses.  Exam reveals no gallop and no friction rub.   No murmur heard. Pulmonary/Chest: Effort normal and breath sounds normal. No respiratory distress. He has no wheezes. He has no rales. He exhibits no tenderness.  Abdominal: Soft. Bowel sounds are normal. He exhibits no distension. There is no tenderness. There is no rebound and no guarding.  Musculoskeletal: Normal range of motion. He exhibits tenderness. He exhibits  no edema.  Patient with superior lumbar midline tenderness to palpation. Mild diffuse paraspinal tenderness.  Neurological: He is alert and oriented to person, place, and time.  Tremor noted. 5/5 motor in all extremities. Sensation intact.  Skin: Skin is warm and dry. No rash noted. No erythema.  Patient with multiple bruises and superficial lacerations in various stages of healing.  Psychiatric: He has a normal mood and affect. His behavior is normal. Judgment normal.  Nursing note and vitals reviewed.    ED Treatments / Results  DIAGNOSTIC STUDIES: Oxygen Saturation is 95% on RA, adequate by my interpretation.    COORDINATION OF CARE: 9:20 AM Discussed treatment plan with pt at bedside and pt agreed to plan. I will check the patient's X-rays.  Labs (all labs ordered are listed, but only abnormal results are displayed) Labs Reviewed  CBC WITH DIFFERENTIAL/PLATELET - Abnormal; Notable for the following:       Result Value   MCHC 36.1 (*)    All other components within normal limits  COMPREHENSIVE METABOLIC PANEL - Abnormal; Notable for the following:    Glucose, Bld 104 (*)    AST 67 (*)    Total Bilirubin 1.3 (*)    All other components within normal limits  PROTIME-INR  APTT  URINALYSIS, ROUTINE W REFLEX MICROSCOPIC  I-STAT TROPOININ, ED    EKG  EKG Interpretation  Date/Time:  Wednesday Jan 08 2017 09:51:32 EDT Ventricular Rate:  68 PR Interval:    QRS Duration: 153 QT Interval:  460 QTC Calculation: 490 R Axis:   103 Text Interpretation:  Sinus rhythm RBBB and LPFB No STEMI. Unchanged from prior.  Confirmed by Nanda Quinton [phone]) on 01/09/2017 11:57:33 AM       Radiology Dg Chest 2 View  Result Date: 01/08/2017 CLINICAL DATA:  Fall out of bed yesterday with pain, initial encounter EXAM: CHEST  2 VIEW COMPARISON:  05/10/2016 FINDINGS: Cardiac shadow is within normal limits. The lungs are well aerated bilaterally. No focal infiltrate or sizable effusion is seen.  No acute bony abnormality is noted. IMPRESSION: No active cardiopulmonary disease. Electronically Signed   By: Inez Catalina M.D.   On: 01/08/2017 10:38   Dg Lumbar Spine 2-3 Views  Result Date: 01/08/2017 CLINICAL DATA:  Fall out of bed with low back pain, initial encounter EXAM: LUMBAR SPINE - 3 VIEW COMPARISON:  12/09/2015 FINDINGS: Five lumbar type vertebral bodies are well visualized. Vertebral body height is well maintained in the lumbar spine. A T12 compression deformity is noted which is new from a prior CT examination from March of 2017. Diffuse aortic calcifications are noted. IMPRESSION: T12 compression deformity which is new from 2025 but of uncertain chronicity. Correlation to point tenderness is recommended. MRI may be helpful on a nonemergent basis to assess chronicity. Electronically Signed   By: Inez Catalina M.D.   On: 01/08/2017 10:40   Ct Head Wo Contrast  Result Date: 01/08/2017 CLINICAL DATA:  Fall last night with posterior head injury. Initial encounter. EXAM: CT HEAD WITHOUT CONTRAST TECHNIQUE: Contiguous axial images were obtained from the base of the skull through the vertex without intravenous contrast. COMPARISON:  05/10/2016 FINDINGS: Brain: No evidence of acute infarction, hemorrhage, hydrocephalus, extra-axial collection or mass lesion/mass effect. Moderate patchy low-density in the cerebral white matter, likely from chronic small vessel ischemia. Generalized atrophy. Vascular: Atherosclerotic calcification.  No hyperdense vessel. Skull: Negative for fracture Sinuses/Orbits: No evidence of injury. IMPRESSION: No evidence of intracranial injury. Negative for calvarial fracture. Electronically Signed   By: Monte Fantasia M.D.   On: 01/08/2017 10:59    Procedures Procedures (including critical care time)  Medications Ordered in ED Medications  fentaNYL (SUBLIMAZE) injection 25 mcg (25 mcg Intravenous Given 01/08/17 1118)     Initial Impression / Assessment and Plan / ED  Course  I have reviewed the triage vital signs and the nursing notes.  Pertinent labs & imaging results that were available during my care of the patient were reviewed by me and considered in my medical decision making (see chart for details).     T12 compression fracture noted. Appears to be acute. Patient is ambulating without assistance the department. Appears some tremor and unsteadiness is chronic for the patient. Will discharge home to follow-up with interventional radiology for possible intervention. Return precautions given.  Final Clinical Impressions(s) / ED Diagnoses   Final diagnoses:  Closed compression fracture of thoracic vertebra, initial encounter [facility])  Frequent falls    New Prescriptions Discharge Medication List as of 01/08/2017  2:06 PM    START taking these medications   Details  traMADol (ULTRAM) 50 MG tablet Take 1 tablet (50 mg total) by mouth every 6 (six) hours as needed for severe pain., Starting Wed 01/08/2017, Print       I personally performed the services described in this documentation, which was scribed in my presence. The recorded information has been reviewed and is accurate.      Julianne Rice, MD 01/09/17 1254

## 2017-01-10 ENCOUNTER — Encounter (HOSPITAL_COMMUNITY): Payer: Self-pay | Admitting: Emergency Medicine

## 2017-01-10 ENCOUNTER — Emergency Department (HOSPITAL_COMMUNITY)
Admission: EM | Admit: 2017-01-10 | Discharge: 2017-01-10 | Disposition: A | Discharge status: Home / Self Care | Payer: Medicare Other | Attending: Emergency Medicine | Admitting: Emergency Medicine

## 2017-01-10 DIAGNOSIS — F17210 Nicotine dependence, cigarettes, uncomplicated: Secondary | ICD-10-CM | POA: Diagnosis not present

## 2017-01-10 DIAGNOSIS — Y939 Activity, unspecified: Secondary | ICD-10-CM | POA: Diagnosis not present

## 2017-01-10 DIAGNOSIS — W19XXXA Unspecified fall, initial encounter: Secondary | ICD-10-CM | POA: Diagnosis not present

## 2017-01-10 DIAGNOSIS — S22080A Wedge compression fracture of T11-T12 vertebra, initial encounter for closed fracture: Secondary | ICD-10-CM | POA: Diagnosis not present

## 2017-01-10 DIAGNOSIS — Y999 Unspecified external cause status: Secondary | ICD-10-CM | POA: Diagnosis not present

## 2017-01-10 DIAGNOSIS — I5043 Acute on chronic combined systolic (congestive) and diastolic (congestive) heart failure: Secondary | ICD-10-CM | POA: Diagnosis not present

## 2017-01-10 DIAGNOSIS — M545 Low back pain, unspecified: Secondary | ICD-10-CM

## 2017-01-10 DIAGNOSIS — Y929 Unspecified place or not applicable: Secondary | ICD-10-CM | POA: Diagnosis not present

## 2017-01-10 DIAGNOSIS — S3992XA Unspecified injury of lower back, initial encounter: Secondary | ICD-10-CM | POA: Diagnosis present

## 2017-01-10 MED ORDER — MORPHINE SULFATE (PF) 4 MG/ML IV SOLN
4.0000 mg | Freq: Once | INTRAVENOUS | Status: AC
  Administered 2017-01-10: 4 mg via INTRAMUSCULAR
  Filled 2017-01-10: qty 1

## 2017-01-10 NOTE — ED Provider Notes (Signed)
[facility] DEPT Provider Note   CSN: [id number] Arrival date & time: 01/10/17  9629   By signing my name below, I, Shawn [name], attest that this documentation has been prepared under the direction and in the presence of Shawn [name], Eugenia Mcalpine, MD. Electronically Signed: Hilbert [name], Scribe. 01/10/17. 9:37 AM. History   Chief Complaint Chief Complaint  Patient presents with  . Back Pain    The history is provided by the patient. No language interpreter was used.  HPI Comments: Shawn [name] is a 66 y.o. male brought in by ambulance, who presents to the Emergency Department complaining of continuing lower back pain s/p fall that occurred around 3 days ago. He rates his pain 10/10 currently. He was seen here 2 days ago for the same problem. He had an x-ray of his lumbar spine at that time which showed a fracture of his T12 vertebrae compression fracture. He was given a prescription of Tramadol that he hasn't filled yet because he is unable to get transporation. He states that he hasn't been able to fill his prescription because he doesn't have anyone to take him to a pharmacy. He denies urinary or bowel incontinence. He also denies fever/chills or abdominal pain. No focal numbness, weakness, fever or chills  Past Medical History:  Diagnosis Date  . Alcoholism ([place])    "Dry" since 2002; relapse 2014  . Anxiety   . Anxiety and depression   . Bipolar disorder [facility])    Has had multiple psych hospitalization in the past, most recently at [facility] 01/31/11-02/04/11.  Says meds made him emotionally blunted. (lamictal and wellbutrin).  . Diverticular disease    "itis" x 2 episodes (last was about 2007)  . Fracture of metatarsal bone(s), closed    3rd, 4th, 5th mid shaft on left foot  . History of septic shock 11/2015   ? due to small bowel ischemia: hospitalized + surgeries 11/2015  . History of substance abuse    cocaine, marijuana, acid, alcohol (in remission since 2010)  .  Multiple rib fractures 10/09/10   s/p fall down a ravine--9th and 10th on right, contusion of left 7th and 8th ribs  . Osteoarthritis X years   left knee.  Distant hx (age 78) "dislocated" knee and required surgery, then crush injury to patella required knee cap removal.  . Solar dermatitis    face and ears  . Tobacco dependence   . Vitamin B12 deficiency 10/2013   IF ab positive.  Replacement therapy started end of March 2015    Patient Active Problem List   Diagnosis Date Noted  . Pain   . Debility 11/29/2015  . Debilitated 11/29/2015  . Paroxysmal atrial fibrillation (HCC)   . Acute on chronic systolic heart failure ([place])   . Adrenal insufficiency ([place])   . Alcohol abuse   . 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 Polysubstance abuse   . Bipolar affective disorder in remission ([place])   . History of ventilator dependency ([place])   . AKI (acute kidney injury) ([place])   . Dysphagia   . Tachycardia   . Tachypnea   . Hypokalemia   . Leukocytosis   . Acute blood loss anemia   . Acute respiratory failure ([place])   . Persistent atrial fibrillation ([place])   . Acute on chronic combined systolic and diastolic CHF (congestive heart failure) ([place])   . Acute respiratory failure with hypoxemia ([place])   . Cardiomyopathy, ischemic 11/19/2015  . Complete heart block ([place]) 11/18/2015  .  Malnutrition of moderate degree 11/18/2015  . Pressure ulcer 11/17/2015  . Septic shock ([place]) 11/17/2015  . Pneumatosis coli 11/17/2015  . Gastroenteritis due to norovirus 11/25/2013  . Other pancytopenia ([place]) 11/25/2013  . Dehydration 11/20/2013  . MDD (major depressive disorder) 11/17/2013  . Depression 11/17/2013  . Fall 11/16/2013  . Bipolar affective disorder, current episode mixed ([place]) 04/12/2013  . Alcohol abuse with intoxication ([place]) 04/12/2013  . Alcohol dependence ([place]) 04/12/2013  . Bipolar I disorder, most recent episode depressed ([place]) 04/12/2013  . Constipation 08/08/2012  . Hip fracture, left ([place])  08/06/2012  . Traumatic hematoma of buttock 08/06/2012  . ETOH abuse 08/06/2012  . H/O: substance abuse 08/06/2012  . Tobacco abuse 08/06/2012  . History of hernia repair 08/06/2012  . Hip fracture, intertrochanteric ([place]) 08/06/2012  . Bipolar disorder, current episode mixed, mild ([place]) 02/04/2012  . Health maintenance examination 10/24/2011  . Hypoxia 10/24/2011  . Prostate cancer screening 09/19/2011  . Tobacco dependence 09/08/2011  . Erectile dysfunction 08/06/2011  . Osteoarthritis of left knee 08/06/2011  . Elevated blood pressure reading without diagnosis of hypertension 08/06/2011  . History of depression 08/06/2011    Past Surgical History:  Procedure Laterality Date  . BOWEL RESECTION  06/15/2012   Procedure: SMALL BOWEL RESECTION;  Surgeon: Edward Jolly, MD;  Location: [facility];  Service: General;  Laterality: N/A;  . BOWEL RESECTION  11/19/2015   Procedure: SMALL BOWEL RESECTION;  Surgeon: Ralene Ok, MD;  Location: [facility];  Service: General;;  . CARDIAC CATHETERIZATION N/A 11/20/2015   Procedure: Temporary Wire;  Surgeon: Evans Lance, MD;  Location: [facility];  Service: Cardiovascular;  Laterality: N/A;  . FEMUR IM NAIL  08/06/2012   Procedure: INTRAMEDULLARY (IM) NAIL FEMORAL;  Surgeon: Johnn Hai, MD;  [facility];  Service: Orthopedics;  Laterality: Left;  . HERNIA REPAIR     at 66 years of age  80  . INGUINAL HERNIA REPAIR     left  . INGUINAL HERNIA REPAIR  06/15/2012   Procedure: HERNIA REPAIR INGUINAL INCARCERATED;  Surgeon: Edward Jolly, MD;  Location[facility];  Service: General;  Laterality: Right;  . [place]  . LAPAROTOMY N/A 11/17/2015   Procedure: EXPLORATORY LAPAROTOMY;  Surgeon: Donnie Mesa, MD;  Location: [facility];  Service: General;  Laterality: N/A;  . LAPAROTOMY N/A 11/19/2015   Procedure: EXPLORATORY LAPAROTOMY OPEN ABDOMEN ABDOMINAL WOUND VAC CHANGE;  Surgeon: Ralene Ok, MD;   Location: [facility];  Service: General;  Laterality: N/A;  . [place]   s/p crush injury  . SMALL BOWEL REPAIR N/A 11/21/2015   Procedure: SMALL BOWEL REPAIR, SMALL BOWEL ANATAMOSIS AND ABDOMINAL CLOSURE;  Surgeon: Coralie Keens, MD;  Location: [facility];  Service: General;  Laterality: N/A;  . TOTAL KNEE ARTHROPLASTY  11/20/2011   Procedure: TOTAL KNEE ARTHROPLASTY;  Surgeon: Ninetta Lights, MD;  Location: [facility];  Service: Orthopedics;  Laterality: Left;  DR Percell Miller WANTS 90MINUTES FOR THIS CASE       Home Medications    Prior to Admission medications   Medication Sig Start Date End Date Taking? Authorizing Provider  acetaminophen (TYLENOL) 650 MG CR tablet Take 1,300 mg by mouth every 8 (eight) hours as needed for pain.   Yes [provider]  Multiple Vitamin (MULTIVITAMIN WITH MINERALS) TABS tablet Take 1 tablet by mouth daily. 05/10/16  Yes Ward, Delice Bison, DO  naphazoline-pheniramine (NAPHCON-A) 0.025-0.3 % ophthalmic solution Place 1  drop into both eyes 4 (four) times daily as needed for irritation.   Yes [provider]  albuterol (PROVENTIL) (2.5 MG/3ML) 0.083% nebulizer solution Take 3 mLs (2.5 mg total) by nebulization every 3 (three) hours as needed for wheezing. Patient not taking: Reported on 01/08/2017 05/10/16   Ward, Delice Bison, DO  amiodarone (PACERONE) 400 MG tablet Take 1 tablet (400 mg total) by mouth 2 (two) times daily. Patient not taking: Reported on 01/08/2017 05/10/16   Ward, Delice Bison, DO  aspirin EC 81 MG tablet Take 81 mg by mouth daily as needed for moderate pain.    [provider]  dicyclomine (BENTYL) 10 MG capsule Take 1 capsule (10 mg total) by mouth 4 (four) times daily. Patient not taking: Reported on 01/08/2017 05/10/16   Ward, Delice Bison, DO  escitalopram (LEXAPRO) 10 MG tablet Take 1 tablet (10 mg total) by mouth at bedtime. Patient not taking: Reported on 01/08/2017 05/10/16   Ward, Delice Bison, DO  folic acid (FOLVITE) 1 MG tablet Take 1  tablet (1 mg total) by mouth daily. Patient not taking: Reported on 01/08/2017 05/10/16   Ward, Delice Bison, DO  furosemide (LASIX) 40 MG tablet Take 1 tablet (40 mg total) by mouth daily. Patient not taking: Reported on 01/08/2017 05/10/16   Ward, Delice Bison, DO  hydroxypropyl methylcellulose / hypromellose (ISOPTO TEARS / GONIOVISC) 2.5 % ophthalmic solution Place 2 drops into both eyes as needed for dry eyes. Patient not taking: Reported on 01/08/2017 05/10/16   Ward, Delice Bison, DO  lamoTRIgine (LAMICTAL) 200 MG tablet Take 1 tablet (200 mg total) by mouth at bedtime. Patient not taking: Reported on 01/08/2017 05/10/16   Ward, Delice Bison, DO  potassium chloride SA (K-DUR,KLOR-CON) 20 MEQ tablet Take 2 tablets (40 mEq total) by mouth daily. Patient not taking: Reported on 01/08/2017 05/10/16   Ward, Delice Bison, DO  QUEtiapine (SEROQUEL) 50 MG tablet Take 1 tablet (50 mg total) by mouth at bedtime. Patient not taking: Reported on 01/08/2017 05/10/16   Ward, Delice Bison, DO  thiamine 100 MG tablet Take 1 tablet (100 mg total) by mouth daily. Patient not taking: Reported on 01/08/2017 05/10/16   Ward, Delice Bison, DO  traMADol (ULTRAM) 50 MG tablet Take 1 tablet (50 mg total) by mouth every 6 (six) hours as needed for severe pain. 01/08/17   Julianne Rice, MD    Family History Family History  Problem Relation Age of Onset  . Arthritis Mother   . Arthritis Father   . Cancer Brother        lung cancer.  Died in early 59s.    Social History Social History  Substance Use Topics  . Smoking status: Current Every Day Smoker    Packs/day: 0.50    Years: 30.00    Types: Cigarettes    Last attempt to quit: 12/30/2014  . Smokeless tobacco: Never Used  . Alcohol use Yes     Comment: daily 6 beers and 1 pint of vodka  11/28/2015   " I quit drinking 3 years ago "     Allergies   Codeine and Depakote [divalproex sodium]   Review of Systems Review of Systems  Constitutional: Negative for chills and fever.  Gastrointestinal:  Negative for abdominal pain.  Genitourinary: Negative for dysuria and enuresis.  Musculoskeletal: Positive for back pain.  Allergic/Immunologic: Negative for immunocompromised state.  Neurological: Negative for weakness and numbness.  Hematological: Does not bruise/bleed easily.     Physical Exam Updated  Vital Signs BP (!) 135/93   Pulse (!) 53   Temp 97.9 F (36.6 C) (Oral)   Resp 18   SpO2 96%   Physical Exam Physical Exam  Nursing note and vitals reviewed. Constitutional: Well developed, well nourished, non-toxic, and in no acute distress Head: Normocephalic and atraumatic.  Mouth/Throat: Oropharynx is clear and moist.  Neck: Normal range of motion. Neck supple.  Cardiovascular: Normal rate and regular rhythm.   Pulmonary/Chest: Effort normal and breath sounds normal.  Abdominal: Soft. There is no tenderness. There is no rebound and no guarding.  Musculoskeletal: Normal range of motion. tenderness along the low lumbar spine Neurological: Alert, no facial droop, fluent speech, moves all extremities symmetrically, full strength hip flexion/extension, ankle dorsiflexion and plantarflexion, sensation to light touch in tact in bilateral lower extremities Skin: Skin is warm and dry.  Psychiatric: Cooperative   ED Treatments / Results  DIAGNOSTIC STUDIES: Oxygen Saturation is 96% on RA, adequate by my interpretation.    COORDINATION OF CARE: 9:36 AM Discussed treatment plan with pt at bedside and pt agreed to plan. I will give him some medication while he is here. I will consult a Education officer, museum for the patient.  Labs (all labs ordered are listed, but only abnormal results are displayed) Labs Reviewed - No data to display  EKG  EKG Interpretation None       Radiology No results found.  Procedures Procedures (including critical care time)  Medications Ordered in ED Medications  morphine 4 MG/ML injection 4 mg (4 mg Intramuscular Given 01/10/17 0944)      Initial Impression / Assessment and Plan / ED Course  I have reviewed the triage vital signs and the nursing notes.  Pertinent labs & imaging results that were available during my care of the patient were reviewed by me and considered in my medical decision making (see chart for details).     Spoke with social work. Patient has a health aide who is supposed to pick up his medications, set up appointment, given food, and be his transportation. He has not notified his health aide regarding any of this. We have notified his health aide. Pick him up from the ED. Instructed this health aide to pick up his prescriptions as well as set up a follow-up appointment for IR which was initially recommended for him on the initial visit.  Symptoms have been unchanged. He did receive IM morphine for pain control, to good effect. He is neurologically intact, without concerning features on exam or history that would suggest an acute spinal cord process.  The patient appears reasonably screened and/or stabilized for discharge and I doubt any other medical condition or other [facility] requiring further screening, evaluation, or treatment in the ED at this time prior to discharge.  Strict return and follow-up instructions reviewed. He expressed understanding of all discharge instructions and felt comfortable with the plan of care.   Final Clinical Impressions(s) / ED Diagnoses   Final diagnoses:  Acute midline low back pain without sciatica  T12 compression fracture (HCC)    New Prescriptions New Prescriptions   No medications on file   I personally performed the services described in this documentation, which was scribed in my presence. The recorded information has been reviewed and is accurate.    Forde Dandy, MD 01/10/17 1230

## 2017-01-10 NOTE — ED Notes (Signed)
[place] and has no method of transportation or family to help get medications.

## 2017-01-10 NOTE — ED Notes (Signed)
Contacted pt's family who states they will come pick pt up and take him to get his medications filled.

## 2017-01-10 NOTE — Discharge Instructions (Signed)
You should have a health aide who helps you get pain medications and set up appointments.  Please have your health aid set up follow-up with the IR doctor regarding your back pain and to fill your pain medications.

## 2017-01-10 NOTE — ED Triage Notes (Signed)
fEll x 2 days ago. Seen in ED yesterday due to lower back pain from fall. Had compression fxs but unable to get his tramadol rx filled. A/o. nad.

## 2017-01-20 ENCOUNTER — Emergency Department (HOSPITAL_COMMUNITY)
Admission: EM | Admit: 2017-01-20 | Discharge: 2017-01-20 | Disposition: A | Discharge status: Home / Self Care | Payer: Medicare Other | Attending: Emergency Medicine | Admitting: Emergency Medicine

## 2017-01-20 ENCOUNTER — Encounter (HOSPITAL_COMMUNITY): Payer: Self-pay

## 2017-01-20 DIAGNOSIS — M545 Low back pain, unspecified: Secondary | ICD-10-CM

## 2017-01-20 DIAGNOSIS — Z79899 Other long term (current) drug therapy: Secondary | ICD-10-CM | POA: Insufficient documentation

## 2017-01-20 DIAGNOSIS — I5043 Acute on chronic combined systolic (congestive) and diastolic (congestive) heart failure: Secondary | ICD-10-CM | POA: Insufficient documentation

## 2017-01-20 DIAGNOSIS — F17210 Nicotine dependence, cigarettes, uncomplicated: Secondary | ICD-10-CM | POA: Insufficient documentation

## 2017-01-20 MED ORDER — METHOCARBAMOL 500 MG PO TABS: 500.0000 mg | ORAL_TABLET | Freq: Three times a day (TID) | ORAL | 0 refills | Status: DC

## 2017-01-20 MED ORDER — IBUPROFEN 600 MG PO TABS: 600.0000 mg | ORAL_TABLET | Freq: Four times a day (QID) | ORAL | 0 refills | Status: DC | PRN

## 2017-01-20 MED ORDER — FENTANYL CITRATE (PF) 100 MCG/2ML IJ SOLN
50.0000 ug | Freq: Once | INTRAMUSCULAR | Status: AC
  Administered 2017-01-20: 50 ug via INTRAVENOUS
  Filled 2017-01-20: qty 2

## 2017-01-20 NOTE — ED Notes (Signed)
Per Tammy - PA, would like for writer to call in prescriptions to [facility] so they can deliver medications to patient due to no ride to get medications.  Writer spoke to [place] at [employer].

## 2017-01-20 NOTE — ED Triage Notes (Signed)
Pt reports chronic lower back pain, nonradiating.  Reports pain flared up yesterday.  Denies new injury.

## 2017-01-20 NOTE — Discharge Instructions (Signed)
You will need to call one of the providers listed to arrange a follow-up appt and establish primary care.

## 2017-01-20 NOTE — ED Notes (Signed)
Pt states that he is having back pain that is in his left lower side.

## 2017-01-22 ENCOUNTER — Encounter (HOSPITAL_COMMUNITY): Payer: Self-pay | Admitting: *Deleted

## 2017-01-22 ENCOUNTER — Emergency Department (HOSPITAL_COMMUNITY): Payer: Medicare Other

## 2017-01-22 ENCOUNTER — Emergency Department (HOSPITAL_COMMUNITY)
Admission: EM | Admit: 2017-01-22 | Discharge: 2017-01-22 | Disposition: A | Discharge status: Home / Self Care | Payer: Medicare Other | Attending: Emergency Medicine | Admitting: Emergency Medicine

## 2017-01-22 DIAGNOSIS — S22089A Unspecified fracture of T11-T12 vertebra, initial encounter for closed fracture: Secondary | ICD-10-CM | POA: Insufficient documentation

## 2017-01-22 DIAGNOSIS — S22080A Wedge compression fracture of T11-T12 vertebra, initial encounter for closed fracture: Secondary | ICD-10-CM

## 2017-01-22 DIAGNOSIS — I5043 Acute on chronic combined systolic (congestive) and diastolic (congestive) heart failure: Secondary | ICD-10-CM | POA: Insufficient documentation

## 2017-01-22 DIAGNOSIS — Y92009 Unspecified place in unspecified non-institutional (private) residence as the place of occurrence of the external cause: Secondary | ICD-10-CM | POA: Insufficient documentation

## 2017-01-22 DIAGNOSIS — Y999 Unspecified external cause status: Secondary | ICD-10-CM | POA: Diagnosis not present

## 2017-01-22 DIAGNOSIS — Z79899 Other long term (current) drug therapy: Secondary | ICD-10-CM | POA: Insufficient documentation

## 2017-01-22 DIAGNOSIS — F17210 Nicotine dependence, cigarettes, uncomplicated: Secondary | ICD-10-CM | POA: Diagnosis not present

## 2017-01-22 DIAGNOSIS — Y939 Activity, unspecified: Secondary | ICD-10-CM | POA: Insufficient documentation

## 2017-01-22 DIAGNOSIS — W06XXXA Fall from bed, initial encounter: Secondary | ICD-10-CM | POA: Diagnosis not present

## 2017-01-22 DIAGNOSIS — S299XXA Unspecified injury of thorax, initial encounter: Secondary | ICD-10-CM | POA: Diagnosis present

## 2017-01-22 MED ORDER — OXYCODONE-ACETAMINOPHEN 5-325 MG PO TABS
1.0000 | ORAL_TABLET | Freq: Once | ORAL | Status: AC
  Administered 2017-01-22: 1 via ORAL
  Filled 2017-01-22: qty 1

## 2017-01-22 MED ORDER — KETOROLAC TROMETHAMINE 60 MG/2ML IM SOLN
60.0000 mg | Freq: Once | INTRAMUSCULAR | Status: AC
  Administered 2017-01-22: 60 mg via INTRAMUSCULAR
  Filled 2017-01-22: qty 2

## 2017-01-22 NOTE — ED Notes (Signed)
[employer] here to apply back brace.

## 2017-01-22 NOTE — Discharge Instructions (Signed)
Wear the brace as instructed.  You have an appt with the doctor listed.  You will have to make arrangements for transportation

## 2017-01-22 NOTE — Care Management Note (Addendum)
Case Management Note  Patient Details  Name: Shawn [name] MRN: [id number] Date of Birth: 1951/01/04  Subjective/Objective:   Patient seen in ER. From home with son, walks with cane, has walker if needed. Several recent visits to ER for back. Patient transportation issues and no PCP. CM gave PCP list, information for [facility] and [facility] to patient.                 Action/Plan: CM called [facility] for appointment to help patient. Unable to secure appointment. CM called and left voicemail with [facility] to see if they can accept patient.  CM has made appointment with Dr. Lysle Morales to establish primary care. Still awaiting call back from [facility] to schedule transportation for patient.   ADDENDUM: [facility] called back. Patient has used [facility] before and had a note that there was an incident and that he is not eligible to ride [facility] anymore. CM asked for supervisor to review and reconsider. [facility] to call CM back. CM will continue to work with patient and call him at home if patient discharges from ER regarding [place].   ADDENDUM: [facility] called, they are willing to offer transportation services to patient again. [facility] will call patient to set up appointment. CM will follow up with patient as well.   ADDENDUM: Spoke with patient over the phone, confirmed [facility] will pick patient up at 8:45 on June 22nd for 10:00 appointment with Dr. Meda Coffee.   01/23/2017: CM spoke with Lattie Haw of [facility], they will assess patient and consider for enrollment into [facility].  Expected Discharge Date:       01/22/2017           Expected Discharge Plan:  Home/Self Care  In-House Referral:     Discharge planning Services  CM Consult  Post Acute Care Choice:    Choice offered to:     DME Arranged:    DME Agency:     HH Arranged:    HH Agency:     Status of Service:  In process, will continue to follow  If discussed at Long Length of  Stay Meetings, dates discussed:    Additional Comments:  Panhia Karl, [facility] [place], RN 01/22/2017, 9:52 AM

## 2017-01-22 NOTE — ED Triage Notes (Signed)
Pt comes in from home by EMS for back pain. Pt had a fall out of bed on Monday and was seen here. States the medication he was given here had worn off and the at home meds aren't helping any more.

## 2017-01-22 NOTE — ED Notes (Signed)
Back brace applied. Pa aware.

## 2017-01-22 NOTE — ED Provider Notes (Signed)
[facility] DEPT Provider Note   CSN: [id number] Arrival date & time: 01/22/17  0801     History   Chief Complaint Chief Complaint  Patient presents with  . Back Pain    HPI Shawn [name] is a 66 y.o. male.  HPI   Shawn [name] is a 66 y.o. male who presents to the Emergency Department complaining of persistent low back pain.  This is the patient's 4th ER visits for this.  He reports having mid low back pain since early May secondary to a fall out of his bed.  He describes a sharp, constant pain to the middle of his lower back that radiates toward the left hip.  Pain is worse with movement.  He was seen here two days ago and given prescriptions for Ibuprofen and Robaxin which he states are not helping the pain. He denies fever, chills, abdominal pain, vomiting, pain, urine or bowel changes, numbness or weakness of the lower extremities    Past Medical History:  Diagnosis Date  . Alcoholism ([place])    "Dry" since 2002; relapse 2014  . Anxiety   . Anxiety and depression   . Bipolar disorder [facility])    Has had multiple psych hospitalization in the past, most recently at [place] [facility] 01/31/11-02/04/11.  Says meds made him emotionally blunted. (lamictal and wellbutrin).  . Diverticular disease    "itis" x 2 episodes (last was about 2007)  . Fracture of metatarsal bone(s), closed    3rd, 4th, 5th mid shaft on left foot  . History of septic shock 11/2015   ? due to small bowel ischemia: hospitalized + surgeries 11/2015  . History of substance abuse    cocaine, marijuana, acid, alcohol (in remission since 2010)  . Multiple rib fractures 10/09/10   s/p fall down a ravine--9th and 10th on right, contusion of left 7th and 8th ribs  . Osteoarthritis X years   left knee.  Distant hx (age 54) "dislocated" knee and required surgery, then crush injury to patella required knee cap removal.  . Solar dermatitis    face and ears  . Tobacco dependence   . Vitamin B12 deficiency 10/2013     IF ab positive.  Replacement therapy started end of March 2015    Patient Active Problem List   Diagnosis Date Noted  . Pain   . Debility 11/29/2015  . Debilitated 11/29/2015  . Paroxysmal atrial fibrillation (HCC)   . Acute on chronic systolic heart failure ([place])   . Adrenal insufficiency ([place])   . Alcohol abuse   . 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 Polysubstance abuse   . Bipolar affective disorder in remission ([place])   . History of ventilator dependency ([place])   . AKI (acute kidney injury) ([place])   . Dysphagia   . Tachycardia   . Tachypnea   . Hypokalemia   . Leukocytosis   . Acute blood loss anemia   . Acute respiratory failure ([place])   . Persistent atrial fibrillation ([place])   . Acute on chronic combined systolic and diastolic CHF (congestive heart failure) ([place])   . Acute respiratory failure with hypoxemia ([place])   . Cardiomyopathy, ischemic 11/19/2015  . Complete heart block ([place]) 11/18/2015  . Malnutrition of moderate degree 11/18/2015  . Pressure ulcer 11/17/2015  . Septic shock ([place]) 11/17/2015  . Pneumatosis coli 11/17/2015  . Gastroenteritis due to norovirus 11/25/2013  . Other pancytopenia ([place]) 11/25/2013  . Dehydration 11/20/2013  . MDD (major depressive disorder) 11/17/2013  .  Depression 11/17/2013  . Fall 11/16/2013  . Bipolar affective disorder, current episode mixed ([place]) 04/12/2013  . Alcohol abuse with intoxication ([place]) 04/12/2013  . Alcohol dependence ([place]) 04/12/2013  . Bipolar I disorder, most recent episode depressed ([place]) 04/12/2013  . Constipation 08/08/2012  . Hip fracture, left ([place]) 08/06/2012  . Traumatic hematoma of buttock 08/06/2012  . ETOH abuse 08/06/2012  . H/O: substance abuse 08/06/2012  . Tobacco abuse 08/06/2012  . History of hernia repair 08/06/2012  . Hip fracture, intertrochanteric ([place]) 08/06/2012  . Bipolar disorder, current episode mixed, mild ([place]) 02/04/2012  . Health maintenance examination 10/24/2011  . Hypoxia  10/24/2011  . Prostate cancer screening 09/19/2011  . Tobacco dependence 09/08/2011  . Erectile dysfunction 08/06/2011  . Osteoarthritis of left knee 08/06/2011  . Elevated blood pressure reading without diagnosis of hypertension 08/06/2011  . History of depression 08/06/2011    Past Surgical History:  Procedure Laterality Date  . BOWEL RESECTION  06/15/2012   Procedure: SMALL BOWEL RESECTION;  Surgeon: Edward Jolly, MD;  Location: [facility];  Service: General;  Laterality: N/A;  . BOWEL RESECTION  11/19/2015   Procedure: SMALL BOWEL RESECTION;  Surgeon: Ralene Ok, MD;  Location: [facility];  Service: General;;  . CARDIAC CATHETERIZATION N/A 11/20/2015   Procedure: Temporary Wire;  Surgeon: Evans Lance, MD;  Location: [facility];  Service: Cardiovascular;  Laterality: N/A;  . FEMUR IM NAIL  08/06/2012   Procedure: INTRAMEDULLARY (IM) NAIL FEMORAL;  Surgeon: Johnn Hai, MD;  Location: [facility];  Service: Orthopedics;  Laterality: Left;  . HERNIA REPAIR     at 66 years of age  33  . INGUINAL HERNIA REPAIR     left  . INGUINAL HERNIA REPAIR  06/15/2012   Procedure: HERNIA REPAIR INGUINAL INCARCERATED;  Surgeon: Edward Jolly, MD;  Location: [facility];  Service: General;  Laterality: Right;  . [place]  . LAPAROTOMY N/A 11/17/2015   Procedure: EXPLORATORY LAPAROTOMY;  Surgeon: Donnie Mesa, MD;  Location: [facility];  Service: General;  Laterality: N/A;  . LAPAROTOMY N/A 11/19/2015   Procedure: EXPLORATORY LAPAROTOMY OPEN ABDOMEN ABDOMINAL WOUND VAC CHANGE;  Surgeon: Ralene Ok, MD;  Location: [facility];  Service: General;  Laterality: N/A;  . [place]   s/p crush injury  . SMALL BOWEL REPAIR N/A 11/21/2015   Procedure: SMALL BOWEL REPAIR, SMALL BOWEL ANATAMOSIS AND ABDOMINAL CLOSURE;  Surgeon: Coralie Keens, MD;  Location: [facility];  Service: General;  Laterality: N/A;  . TOTAL KNEE ARTHROPLASTY  11/20/2011   Procedure: TOTAL KNEE  ARTHROPLASTY;  Surgeon: Ninetta Lights, MD;  Location: [facility];  Service: Orthopedics;  Laterality: Left;  DR Percell Miller WANTS 90MINUTES FOR THIS CASE       Home Medications    Prior to Admission medications   Medication Sig Start Date End Date Taking? Authorizing Provider  acetaminophen (TYLENOL) 650 MG CR tablet Take 1,300 mg by mouth every 8 (eight) hours as needed for pain.    [provider]  ibuprofen (ADVIL,MOTRIN) 600 MG tablet Take 1 tablet (600 mg total) by mouth every 6 (six) hours as needed. Take with food 01/20/17   Foday Cone, PA-C  methocarbamol (ROBAXIN) 500 MG tablet Take 1 tablet (500 mg total) by mouth 3 (three) times daily. 01/20/17   Kem Parkinson, PA-C    Family History Family History  Problem Relation Age of Onset  . Arthritis Mother   . Arthritis Father   .  Cancer Brother        lung cancer.  Died in early 68s.    Social History Social History  Substance Use Topics  . Smoking status: Current Every Day Smoker    Packs/day: 0.50    Years: 30.00    Types: Cigarettes    Last attempt to quit: 12/30/2014  . Smokeless tobacco: Never Used  . Alcohol use Yes     Comment: drinks heavily occassionally, none in 2 weeks.     Allergies   Codeine and Depakote [divalproex sodium]   Review of Systems Review of Systems  Constitutional: Negative for fever.  Respiratory: Negative for shortness of breath.   Gastrointestinal: Negative for abdominal pain, constipation and vomiting.  Genitourinary: Negative for decreased urine volume, difficulty urinating, dysuria, flank pain and hematuria.  Musculoskeletal: Positive for back pain. Negative for joint swelling.  Skin: Negative for rash.  Neurological: Negative for weakness and numbness.  All other systems reviewed and are negative.    Physical Exam Updated Vital Signs BP 128/60 (BP Location: Left Arm)   Pulse 75   Temp 97 F (36.1 C) (Axillary)   Resp 18   Ht 6' (1.829 m)   Wt 81.6 kg (180 lb)    SpO2 100%   BMI 24.41 kg/m   Physical Exam  Constitutional: He is oriented to person, place, and time. He appears well-developed and well-nourished. No distress.  HENT:  Head: Normocephalic and atraumatic.  Neck: Normal range of motion. Neck supple.  Cardiovascular: Normal rate, regular rhythm, normal heart sounds and intact distal pulses.   No murmur heard. Pulmonary/Chest: Effort normal and breath sounds normal. No respiratory distress.  Abdominal: Soft. He exhibits no distension. There is no tenderness.  Musculoskeletal: He exhibits tenderness. He exhibits no edema.       Lumbar back: He exhibits tenderness and pain. He exhibits normal range of motion, no swelling, no deformity, no laceration and normal pulse.  Midline tenderness of upper and lower lumbar spine.  No bony step offs.  Pt has 5/5 strength against resistance of bilateral lower extremities.  Neg SLR bilaterally   Neurological: He is alert and oriented to person, place, and time. He has normal strength. No sensory deficit. He exhibits normal muscle tone. Coordination and gait normal.  Reflex Scores:      Patellar reflexes are 2+ on the right side and 2+ on the left side.      Achilles reflexes are 2+ on the right side and 2+ on the left side. Skin: Skin is warm and dry. Capillary refill takes less than 2 seconds. No rash noted.  Nursing note and vitals reviewed.    ED Treatments / Results  Labs (all labs ordered are listed, but only abnormal results are displayed) Labs Reviewed - No data to display  EKG  EKG Interpretation None       Radiology Ct Thoracic Spine Wo Contrast  Result Date: 01/22/2017 CLINICAL DATA:  Status post fall out of bed 01/20/2017. EXAM: CT THORACIC AND LUMBAR SPINE WITHOUT CONTRAST TECHNIQUE: Multidetector CT imaging of the thoracic and lumbar spine was performed without contrast. Multiplanar CT image reconstructions were also generated. COMPARISON:  None. FINDINGS: CT THORACIC SPINE  FINDINGS Alignment: Normal. Vertebrae: Generalized osteopenia. Acute T12 anterior vertebral body compression fracture with approximately 40% height loss. Remainder the vertebral body heights arm maintained. No aggressive osseous lesion. Paraspinal and other soft tissues: No paraspinal abnormality. Disc levels: Mild degenerative disc disease with disc height loss at C6-7. Remainder the  disc spaces are relatively well maintained. Mild bilateral facet arthropathy at T12-L1. Bilateral neural foramina are patent. CT LUMBAR SPINE FINDINGS Segmentation: 5 lumbar type vertebrae. Alignment: 2 mm grade 1 anterolisthesis of L4 on L5 secondary to bilateral facet arthropathy. Vertebrae: Generalized osteopenia. No acute fracture or focal pathologic process. Mild osteoarthritis of bilateral sacroiliac joints. Paraspinal and other soft tissues: Negative. Abdominal aortic atherosclerosis. Disc levels: Disc spaces are maintained. Bilateral facet arthropathy throughout the lumbar spine most severe at L4-5. Bilateral mild foraminal stenosis at L4-5. IMPRESSION: CT THORACIC SPINE IMPRESSION 1. Acute T12 anterior vertebral body compression fracture with approximately 40% height loss. CT LUMBAR SPINE IMPRESSION 1.  No acute osseous injury of the lumbar spine. Electronically Signed   By: Kathreen Devoid   On: 01/22/2017 10:28   Ct Lumbar Spine Wo Contrast  Result Date: 01/22/2017 CLINICAL DATA:  Status post fall out of bed 01/20/2017. EXAM: CT THORACIC AND LUMBAR SPINE WITHOUT CONTRAST TECHNIQUE: Multidetector CT imaging of the thoracic and lumbar spine was performed without contrast. Multiplanar CT image reconstructions were also generated. COMPARISON:  None. FINDINGS: CT THORACIC SPINE FINDINGS Alignment: Normal. Vertebrae: Generalized osteopenia. Acute T12 anterior vertebral body compression fracture with approximately 40% height loss. Remainder the vertebral body heights arm maintained. No aggressive osseous lesion. Paraspinal and  other soft tissues: No paraspinal abnormality. Disc levels: Mild degenerative disc disease with disc height loss at C6-7. Remainder the disc spaces are relatively well maintained. Mild bilateral facet arthropathy at T12-L1. Bilateral neural foramina are patent. CT LUMBAR SPINE FINDINGS Segmentation: 5 lumbar type vertebrae. Alignment: 2 mm grade 1 anterolisthesis of L4 on L5 secondary to bilateral facet arthropathy. Vertebrae: Generalized osteopenia. No acute fracture or focal pathologic process. Mild osteoarthritis of bilateral sacroiliac joints. Paraspinal and other soft tissues: Negative. Abdominal aortic atherosclerosis. Disc levels: Disc spaces are maintained. Bilateral facet arthropathy throughout the lumbar spine most severe at L4-5. Bilateral mild foraminal stenosis at L4-5. IMPRESSION: CT THORACIC SPINE IMPRESSION 1. Acute T12 anterior vertebral body compression fracture with approximately 40% height loss. CT LUMBAR SPINE IMPRESSION 1.  No acute osseous injury of the lumbar spine. Electronically Signed   By: Kathreen Devoid   On: 01/22/2017 10:28    Procedures Procedures (including critical care time)  Medications Ordered in ED Medications  ketorolac (TORADOL) injection 60 mg (not administered)     Initial Impression / Assessment and Plan / ED Course  I have reviewed the triage vital signs and the nursing notes.  Pertinent labs & imaging results that were available during my care of the patient were reviewed by me and considered in my medical decision making (see chart for details).     Pt is well appearing.  vitals stable. No focal neuro deficits on exam.  This is his 4th ER visit for same.  Pt was seen by me 2 days ago for same.  I gave him referral info and advised him to establish primary care which he has not done.  Labs from his visit on 01/08/17 were wnml, XR showed T12 compression deformity of undetermined age. I will order CT scans of thoracic and lumbar spine today and consult has  been made to case management.    [id number]  Case management to come to ED to speak with pt.  Have arranged appt for pt to see Dr. Meda Coffee on June 22 at 10:00am.  Pt advised that he will need to make arrangements to keep this appt.    Pt fitted for TLSO brace.  Pain improved.  Remains NV intact.  Has ibuprofen and robaxin prescribed from previous visit. Pt walks with cane   Final Clinical Impressions(s) / ED Diagnoses   Final diagnoses:  Compression fracture of T12 vertebra [facility])    New Prescriptions New Prescriptions   No medications on file     Kem Parkinson, PA-C 01/22/17 [place], [place], DO 01/25/17 1450
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
## 2017-02-11 ENCOUNTER — Telehealth (HOSPITAL_COMMUNITY): Payer: Self-pay

## 2017-02-11 NOTE — Telephone Encounter (Signed)
Called to schedule consult, left message for pt to return call. AW 

## 2017-02-21 ENCOUNTER — Ambulatory Visit (INDEPENDENT_AMBULATORY_CARE_PROVIDER_SITE_OTHER): Payer: Medicare Other | Admitting: Family Medicine

## 2017-02-21 ENCOUNTER — Encounter: Payer: Self-pay | Admitting: Family Medicine

## 2017-02-21 VITALS — BP 88/58 | HR 72 | Temp 96.4°F | Resp 16 | Ht 72.0 in | Wt 162.1 lb

## 2017-02-21 DIAGNOSIS — Z87891 Personal history of nicotine dependence: Secondary | ICD-10-CM | POA: Diagnosis not present

## 2017-02-21 DIAGNOSIS — I4510 Unspecified right bundle-branch block: Secondary | ICD-10-CM | POA: Diagnosis not present

## 2017-02-21 DIAGNOSIS — I5043 Acute on chronic combined systolic (congestive) and diastolic (congestive) heart failure: Secondary | ICD-10-CM

## 2017-02-21 DIAGNOSIS — E538 Deficiency of other specified B group vitamins: Secondary | ICD-10-CM | POA: Insufficient documentation

## 2017-02-21 DIAGNOSIS — R5381 Other malaise: Secondary | ICD-10-CM | POA: Diagnosis not present

## 2017-02-21 DIAGNOSIS — E559 Vitamin D deficiency, unspecified: Secondary | ICD-10-CM | POA: Insufficient documentation

## 2017-02-21 DIAGNOSIS — F3130 Bipolar disorder, current episode depressed, mild or moderate severity, unspecified: Secondary | ICD-10-CM | POA: Diagnosis not present

## 2017-02-21 DIAGNOSIS — M8580 Other specified disorders of bone density and structure, unspecified site: Secondary | ICD-10-CM | POA: Insufficient documentation

## 2017-02-21 DIAGNOSIS — Z8639 Personal history of other endocrine, nutritional and metabolic disease: Secondary | ICD-10-CM | POA: Diagnosis not present

## 2017-02-21 DIAGNOSIS — F1021 Alcohol dependence, in remission: Secondary | ICD-10-CM | POA: Diagnosis not present

## 2017-02-21 DIAGNOSIS — R7309 Other abnormal glucose: Secondary | ICD-10-CM

## 2017-02-21 LAB — LIPID PANEL
CHOL/HDL RATIO: 4 ratio (ref <5.0)
CHOLESTEROL: 182 mg/dL (ref <200)
HDL: 45 mg/dL (ref >40)
LDL Cholesterol: 99 mg/dL (ref <100)
Triglycerides: 190 mg/dL — ABNORMAL HIGH (ref <150)
VLDL: 38 mg/dL — AB (ref <30)

## 2017-02-21 MED ORDER — ASPIRIN EC 81 MG PO TBEC: 81.0000 mg | DELAYED_RELEASE_TABLET | Freq: Every day | ORAL | 3 refills | Status: DC

## 2017-02-21 MED ORDER — IBUPROFEN 600 MG PO TABS: 600.0000 mg | ORAL_TABLET | Freq: Four times a day (QID) | ORAL | 0 refills | Status: DC | PRN
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
## 2017-02-21 NOTE — Patient Instructions (Addendum)
NEEDS OUTPATIENT COGNITIVE EVALUATION NEEDS HOME HEALTH TO DO PLACEMENT EVALUATION  LABS TODAY  STOP DRINKING ALCOHOL ENTIRELY TRY TO CUT DOWN ON THE SMOKING  TAKE ONE BABY ASPIRIN A DAY TAKE IBUPROFEN WITH FOOD AS NEEDED BACK PAIN   WALK EVERY DAY TO [place]  NEED OLD RECORDS  SEE ME IN ONE MONTH

## 2017-02-22 LAB — URINALYSIS, ROUTINE W REFLEX MICROSCOPIC
BILIRUBIN URINE: NEGATIVE
Glucose, UA: NEGATIVE
Hgb urine dipstick: NEGATIVE
KETONES UR: NEGATIVE
Leukocytes, UA: NEGATIVE
NITRITE: NEGATIVE
SPECIFIC GRAVITY, URINE: 1.012 (ref 1.001–1.035)
pH: 6 (ref 5.0–8.0)

## 2017-02-22 LAB — VITAMIN B12: Vitamin B-12: 328 pg/mL (ref 200–1100)

## 2017-02-22 LAB — VITAMIN D 25 HYDROXY (VIT D DEFICIENCY, FRACTURES): VIT D 25 HYDROXY: 14 ng/mL — AB (ref 30–100)

## 2017-02-22 LAB — URINALYSIS, MICROSCOPIC ONLY
Bacteria, UA: NONE SEEN [HPF]
Crystals: NONE SEEN [HPF]
SQUAMOUS EPITHELIAL / LPF: NONE SEEN [HPF] (ref <=5)
WBC UA: NONE SEEN WBC/HPF (ref <=5)
Yeast: NONE SEEN [HPF]

## 2017-02-22 LAB — HEMOGLOBIN A1C
Hgb A1c MFr Bld: 5.1 % (ref <5.7)
Mean Plasma Glucose: 100 mg/dL

## 2017-02-22 LAB — TSH: TSH: 1.03 m[IU]/L (ref 0.40–4.50)

## 2017-02-22 LAB — FOLATE: FOLATE: 10.5 ng/mL (ref >5.4)

## 2017-02-24 ENCOUNTER — Telehealth: Payer: Self-pay | Admitting: *Deleted

## 2017-02-24 ENCOUNTER — Telehealth (HOSPITAL_COMMUNITY): Payer: Self-pay

## 2017-02-24 ENCOUNTER — Encounter: Payer: Self-pay | Admitting: Family Medicine

## 2017-02-24 NOTE — Telephone Encounter (Signed)
noted 

## 2017-02-24 NOTE — Telephone Encounter (Signed)
Called to schedule consult, left message for pt to return call. AW 

## 2017-02-24 NOTE — Telephone Encounter (Signed)
Kelly from [facility] called stating she will be sending over orders for the patient to have a Education officer, museum to come in and help with medications, per Claiborne Billings patient is having issues getting medications refilled, Claiborne Billings states she is also sending an order over for patient to have physical therapy. Any questions call kelly at [phone]

## 2017-02-25 ENCOUNTER — Telehealth: Payer: Self-pay | Admitting: *Deleted

## 2017-02-25 NOTE — Telephone Encounter (Signed)
done

## 2017-02-25 NOTE — Telephone Encounter (Signed)
Millie from [facility] called stating they went to see the patient and it was discussed to place patient into a assistant living home and patient told them he already had "John" helping him. Wallis Mart is requesting for the nurse to call her back. [phone]

## 2017-02-25 NOTE — Telephone Encounter (Signed)
Called and spoke to [facility], needs fl2 form, filled out, to md to complete.

## 2017-03-06 ENCOUNTER — Telehealth: Payer: Self-pay | Admitting: *Deleted

## 2017-03-06 NOTE — Telephone Encounter (Signed)
I do not know what she is referring to.  I evaluated him and ordered old records and testing to come back in a  Month.

## 2017-03-06 NOTE — Telephone Encounter (Signed)
I called and made Shawn [name] aware of what Dr Meda Coffee stated.

## 2017-03-06 NOTE — Telephone Encounter (Signed)
Wallis [place] community paramedic, per Altha Harm she was told by [place] Worker that Dr Meda Coffee will not be treating this patient. Altha Harm is very confuse about this because patient has a upcoming appointment. Please advise Alaina [phone]

## 2017-03-07 ENCOUNTER — Telehealth (HOSPITAL_COMMUNITY): Payer: Self-pay

## 2017-03-07 NOTE — Telephone Encounter (Signed)
Called to schedule consult. Pt stated that his back was better and that he did not feel that he needed to come in. AW

## 2017-03-25 ENCOUNTER — Ambulatory Visit: Payer: Self-pay | Admitting: Family Medicine

## 2017-04-17 ENCOUNTER — Encounter (HOSPITAL_COMMUNITY)
Admission: EM | Disposition: A | Discharge status: Home / Self Care | Payer: Self-pay | Source: Home / Self Care | Attending: Emergency Medicine

## 2017-04-17 ENCOUNTER — Observation Stay (HOSPITAL_COMMUNITY): Payer: Medicare Other

## 2017-04-17 ENCOUNTER — Observation Stay (HOSPITAL_COMMUNITY): Payer: Medicare Other | Admitting: Anesthesiology

## 2017-04-17 ENCOUNTER — Emergency Department (HOSPITAL_COMMUNITY): Payer: Medicare Other

## 2017-04-17 ENCOUNTER — Encounter (HOSPITAL_COMMUNITY): Payer: Self-pay | Admitting: *Deleted

## 2017-04-17 ENCOUNTER — Observation Stay (HOSPITAL_COMMUNITY)
Admission: EM | Admit: 2017-04-17 | Discharge: 2017-04-19 | Disposition: A | Discharge status: Home / Self Care | Payer: Medicare Other | Attending: Internal Medicine | Admitting: Internal Medicine

## 2017-04-17 DIAGNOSIS — K922 Gastrointestinal hemorrhage, unspecified: Principal | ICD-10-CM | POA: Diagnosis present

## 2017-04-17 DIAGNOSIS — Z9049 Acquired absence of other specified parts of digestive tract: Secondary | ICD-10-CM | POA: Diagnosis not present

## 2017-04-17 DIAGNOSIS — R001 Bradycardia, unspecified: Secondary | ICD-10-CM | POA: Insufficient documentation

## 2017-04-17 DIAGNOSIS — F17210 Nicotine dependence, cigarettes, uncomplicated: Secondary | ICD-10-CM | POA: Insufficient documentation

## 2017-04-17 DIAGNOSIS — Z7982 Long term (current) use of aspirin: Secondary | ICD-10-CM | POA: Diagnosis not present

## 2017-04-17 DIAGNOSIS — N189 Chronic kidney disease, unspecified: Secondary | ICD-10-CM | POA: Diagnosis not present

## 2017-04-17 DIAGNOSIS — K222 Esophageal obstruction: Secondary | ICD-10-CM | POA: Insufficient documentation

## 2017-04-17 DIAGNOSIS — K625 Hemorrhage of anus and rectum: Secondary | ICD-10-CM

## 2017-04-17 DIAGNOSIS — K210 Gastro-esophageal reflux disease with esophagitis: Secondary | ICD-10-CM | POA: Diagnosis not present

## 2017-04-17 DIAGNOSIS — R109 Unspecified abdominal pain: Secondary | ICD-10-CM | POA: Diagnosis not present

## 2017-04-17 DIAGNOSIS — I700 Atherosclerosis of aorta: Secondary | ICD-10-CM | POA: Diagnosis not present

## 2017-04-17 DIAGNOSIS — R42 Dizziness and giddiness: Secondary | ICD-10-CM | POA: Diagnosis not present

## 2017-04-17 DIAGNOSIS — W19XXXA Unspecified fall, initial encounter: Secondary | ICD-10-CM | POA: Insufficient documentation

## 2017-04-17 DIAGNOSIS — E876 Hypokalemia: Secondary | ICD-10-CM | POA: Insufficient documentation

## 2017-04-17 DIAGNOSIS — I4510 Unspecified right bundle-branch block: Secondary | ICD-10-CM | POA: Diagnosis not present

## 2017-04-17 DIAGNOSIS — I5043 Acute on chronic combined systolic (congestive) and diastolic (congestive) heart failure: Secondary | ICD-10-CM | POA: Diagnosis present

## 2017-04-17 DIAGNOSIS — Z96652 Presence of left artificial knee joint: Secondary | ICD-10-CM | POA: Insufficient documentation

## 2017-04-17 DIAGNOSIS — I442 Atrioventricular block, complete: Secondary | ICD-10-CM | POA: Insufficient documentation

## 2017-04-17 DIAGNOSIS — K920 Hematemesis: Secondary | ICD-10-CM | POA: Insufficient documentation

## 2017-04-17 DIAGNOSIS — Z79899 Other long term (current) drug therapy: Secondary | ICD-10-CM | POA: Diagnosis not present

## 2017-04-17 DIAGNOSIS — I252 Old myocardial infarction: Secondary | ICD-10-CM | POA: Insufficient documentation

## 2017-04-17 DIAGNOSIS — R1030 Lower abdominal pain, unspecified: Secondary | ICD-10-CM

## 2017-04-17 DIAGNOSIS — K209 Esophagitis, unspecified: Secondary | ICD-10-CM | POA: Diagnosis not present

## 2017-04-17 DIAGNOSIS — I255 Ischemic cardiomyopathy: Secondary | ICD-10-CM | POA: Insufficient documentation

## 2017-04-17 DIAGNOSIS — K269 Duodenal ulcer, unspecified as acute or chronic, without hemorrhage or perforation: Secondary | ICD-10-CM | POA: Diagnosis not present

## 2017-04-17 DIAGNOSIS — Z720 Tobacco use: Secondary | ICD-10-CM | POA: Diagnosis present

## 2017-04-17 DIAGNOSIS — K5730 Diverticulosis of large intestine without perforation or abscess without bleeding: Secondary | ICD-10-CM | POA: Diagnosis not present

## 2017-04-17 DIAGNOSIS — K449 Diaphragmatic hernia without obstruction or gangrene: Secondary | ICD-10-CM | POA: Diagnosis not present

## 2017-04-17 HISTORY — PX: ESOPHAGOGASTRODUODENOSCOPY (EGD) WITH PROPOFOL: SHX5813

## 2017-04-17 LAB — COMPREHENSIVE METABOLIC PANEL
ALBUMIN: 2.9 g/dL — AB (ref 3.5–5.0)
ALK PHOS: 102 U/L (ref 38–126)
ALT: 16 U/L — ABNORMAL LOW (ref 17–63)
ANION GAP: 8 (ref 5–15)
AST: 31 U/L (ref 15–41)
BILIRUBIN TOTAL: 0.9 mg/dL (ref 0.3–1.2)
BUN: 10 mg/dL (ref 6–20)
CALCIUM: 8.2 mg/dL — AB (ref 8.9–10.3)
CO2: 23 mmol/L (ref 22–32)
Chloride: 111 mmol/L (ref 101–111)
Creatinine, Ser: 0.69 mg/dL (ref 0.61–1.24)
GFR calc Af Amer: >60 mL/min (ref >60)
GFR calc non Af Amer: >60 mL/min (ref >60)
Glucose, Bld: 89 mg/dL (ref 65–99)
Potassium: 3.3 mmol/L — ABNORMAL LOW (ref 3.5–5.1)
Sodium: 142 mmol/L (ref 135–145)
TOTAL PROTEIN: 5.6 g/dL — AB (ref 6.5–8.1)

## 2017-04-17 LAB — CBC
HCT: 41.3 % (ref 39.0–52.0)
HCT: 38.5 % — ABNORMAL LOW (ref 39.0–52.0)
HCT: 38.5 % — ABNORMAL LOW (ref 39.0–52.0)
HEMOGLOBIN: 13.2 g/dL (ref 13.0–17.0)
Hemoglobin: 13.9 g/dL (ref 13.0–17.0)
Hemoglobin: 13.1 g/dL (ref 13.0–17.0)
MCH: 32.3 pg (ref 26.0–34.0)
MCH: 32.6 pg (ref 26.0–34.0)
MCH: 32.5 pg (ref 26.0–34.0)
MCHC: 33.7 g/dL (ref 30.0–36.0)
MCHC: 34.3 g/dL (ref 30.0–36.0)
MCHC: 34 g/dL (ref 30.0–36.0)
MCV: 96 fL (ref 78.0–100.0)
MCV: 95.1 fL (ref 78.0–100.0)
MCV: 95.5 fL (ref 78.0–100.0)
PLATELETS: 175 10*3/uL (ref 150–400)
PLATELETS: 157 10*3/uL (ref 150–400)
Platelets: 162 10*3/uL (ref 150–400)
RBC: 4.3 MIL/uL (ref 4.22–5.81)
RBC: 4.05 MIL/uL — AB (ref 4.22–5.81)
RBC: 4.03 MIL/uL — AB (ref 4.22–5.81)
RDW: 15.2 % (ref 11.5–15.5)
RDW: 15.2 % (ref 11.5–15.5)
RDW: 15.3 % (ref 11.5–15.5)
WBC: 4.2 10*3/uL (ref 4.0–10.5)
WBC: 4.5 10*3/uL (ref 4.0–10.5)
WBC: 3.9 10*3/uL — AB (ref 4.0–10.5)

## 2017-04-17 LAB — CBC WITH DIFFERENTIAL/PLATELET
BASOS ABS: 0 10*3/uL (ref 0.0–0.1)
BASOS PCT: 1 %
EOS PCT: 1 %
Eosinophils Absolute: 0.1 10*3/uL (ref 0.0–0.7)
HCT: 39.3 % (ref 39.0–52.0)
Hemoglobin: 13.3 g/dL (ref 13.0–17.0)
Lymphocytes Relative: 16 %
Lymphs Abs: 0.6 10*3/uL — ABNORMAL LOW (ref 0.7–4.0)
MCH: 32.4 pg (ref 26.0–34.0)
MCHC: 33.8 g/dL (ref 30.0–36.0)
MCV: 95.6 fL (ref 78.0–100.0)
MONO ABS: 0.3 10*3/uL (ref 0.1–1.0)
Monocytes Relative: 7 %
Neutro Abs: 3.1 10*3/uL (ref 1.7–7.7)
Neutrophils Relative %: 75 %
PLATELETS: 163 10*3/uL (ref 150–400)
RBC: 4.11 MIL/uL — ABNORMAL LOW (ref 4.22–5.81)
RDW: 15.1 % (ref 11.5–15.5)
WBC: 4.1 10*3/uL (ref 4.0–10.5)

## 2017-04-17 LAB — POC OCCULT BLOOD, ED: Fecal Occult Bld: POSITIVE — AB

## 2017-04-17 LAB — OCCULT BLOOD, POC DEVICE: FECAL OCCULT BLD: POSITIVE — AB

## 2017-04-17 LAB — ETHANOL

## 2017-04-17 SURGERY — ESOPHAGOGASTRODUODENOSCOPY (EGD) WITH PROPOFOL
Anesthesia: Monitor Anesthesia Care

## 2017-04-17 MED ORDER — GLYCOPYRROLATE 0.2 MG/ML IJ SOLN
0.2000 mg | Freq: Once | INTRAMUSCULAR | Status: AC | PRN
  Administered 2017-04-17: 0.2 mg via INTRAVENOUS

## 2017-04-17 MED ORDER — ONDANSETRON HCL 4 MG/2ML IJ SOLN
4.0000 mg | Freq: Once | INTRAMUSCULAR | Status: AC
  Administered 2017-04-17: 4 mg via INTRAVENOUS
  Filled 2017-04-17: qty 2

## 2017-04-17 MED ORDER — ATROPINE SULFATE 0.4 MG/ML IJ SOLN
INTRAMUSCULAR | Status: DC | PRN
  Administered 2017-04-17: 0.2 mg via INTRAVENOUS

## 2017-04-17 MED ORDER — MIDAZOLAM HCL 2 MG/2ML IJ SOLN
1.0000 mg | INTRAMUSCULAR | Status: DC
  Administered 2017-04-17: 2 mg via INTRAVENOUS

## 2017-04-17 MED ORDER — ADULT MULTIVITAMIN W/MINERALS CH
1.0000 | ORAL_TABLET | Freq: Every day | ORAL | Status: DC
  Administered 2017-04-18 – 2017-04-19 (×2): 1 via ORAL
  Filled 2017-04-17 (×2): qty 1

## 2017-04-17 MED ORDER — DEXTROSE-NACL 5-0.9 % IV SOLN
INTRAVENOUS | Status: DC
  Administered 2017-04-17: 10:00:00 via INTRAVENOUS

## 2017-04-17 MED ORDER — SODIUM CHLORIDE 0.9% FLUSH
3.0000 mL | Freq: Two times a day (BID) | INTRAVENOUS | Status: DC
  Administered 2017-04-17 – 2017-04-19 (×4): 3 mL via INTRAVENOUS

## 2017-04-17 MED ORDER — ATROPINE SULFATE 0.4 MG/ML IJ SOLN
INTRAMUSCULAR | Status: AC
  Filled 2017-04-17: qty 1

## 2017-04-17 MED ORDER — MECLIZINE HCL 12.5 MG PO TABS
25.0000 mg | ORAL_TABLET | Freq: Once | ORAL | Status: AC
  Administered 2017-04-17: 25 mg via ORAL
  Filled 2017-04-17: qty 2

## 2017-04-17 MED ORDER — PANTOPRAZOLE SODIUM 40 MG IV SOLR
40.0000 mg | Freq: Two times a day (BID) | INTRAVENOUS | Status: DC
  Administered 2017-04-17 – 2017-04-18 (×3): 40 mg via INTRAVENOUS
  Filled 2017-04-17 (×3): qty 40

## 2017-04-17 MED ORDER — SODIUM CHLORIDE 0.9 % IV BOLUS (SEPSIS)
1000.0000 mL | Freq: Once | INTRAVENOUS | Status: AC
  Administered 2017-04-17: 1000 mL via INTRAVENOUS

## 2017-04-17 MED ORDER — NICOTINE 21 MG/24HR TD PT24
21.0000 mg | MEDICATED_PATCH | Freq: Every day | TRANSDERMAL | Status: DC
Start: 2017-04-17 — End: 2017-04-19
  Administered 2017-04-17 – 2017-04-19 (×3): 21 mg via TRANSDERMAL
  Filled 2017-04-17 (×3): qty 1

## 2017-04-17 MED ORDER — PROPOFOL 500 MG/50ML IV EMUL
INTRAVENOUS | Status: DC | PRN
  Administered 2017-04-17: 75 ug/kg/min via INTRAVENOUS

## 2017-04-17 MED ORDER — SODIUM CHLORIDE 0.9 % IV BOLUS (SEPSIS)
500.0000 mL | Freq: Once | INTRAVENOUS | Status: AC
  Administered 2017-04-17: 500 mL via INTRAVENOUS

## 2017-04-17 MED ORDER — FENTANYL CITRATE (PF) 100 MCG/2ML IJ SOLN
25.0000 ug | Freq: Once | INTRAMUSCULAR | Status: AC
  Administered 2017-04-17: 25 ug via INTRAVENOUS

## 2017-04-17 MED ORDER — LACTATED RINGERS IV SOLN
INTRAVENOUS | Status: DC
  Administered 2017-04-17 (×2): via INTRAVENOUS

## 2017-04-17 MED ORDER — SODIUM CHLORIDE 0.9 % IV SOLN: INTRAVENOUS | Status: DC

## 2017-04-17 MED ORDER — ONDANSETRON HCL 4 MG PO TABS: 4.0000 mg | ORAL_TABLET | Freq: Four times a day (QID) | ORAL | Status: DC | PRN

## 2017-04-17 MED ORDER — LIDOCAINE VISCOUS 2 % MT SOLN
15.0000 mL | Freq: Once | OROMUCOSAL | Status: AC
  Administered 2017-04-17: 15 mL via OROMUCOSAL

## 2017-04-17 MED ORDER — LORAZEPAM 2 MG/ML IJ SOLN
1.0000 mg | INTRAMUSCULAR | Status: DC | PRN
  Administered 2017-04-17: 1 mg via INTRAVENOUS
  Filled 2017-04-17: qty 1

## 2017-04-17 MED ORDER — MIDAZOLAM HCL 2 MG/2ML IJ SOLN
INTRAMUSCULAR | Status: AC
  Filled 2017-04-17: qty 2

## 2017-04-17 MED ORDER — PROPOFOL 10 MG/ML IV BOLUS
INTRAVENOUS | Status: DC | PRN
  Administered 2017-04-17 (×3): 10 mg via INTRAVENOUS

## 2017-04-17 MED ORDER — POTASSIUM CHLORIDE 20 MEQ PO PACK: 40.0000 meq | PACK | Freq: Once | ORAL | Status: DC

## 2017-04-17 MED ORDER — POTASSIUM CHLORIDE IN NACL 40-0.9 MEQ/L-% IV SOLN
INTRAVENOUS | Status: DC
  Administered 2017-04-17 – 2017-04-18 (×2): 75 mL/h via INTRAVENOUS

## 2017-04-17 MED ORDER — GLYCOPYRROLATE 0.2 MG/ML IJ SOLN
INTRAMUSCULAR | Status: AC
  Filled 2017-04-17: qty 1

## 2017-04-17 MED ORDER — FENTANYL CITRATE (PF) 100 MCG/2ML IJ SOLN
INTRAMUSCULAR | Status: AC
  Filled 2017-04-17: qty 2

## 2017-04-17 MED ORDER — ONDANSETRON HCL 4 MG/2ML IJ SOLN: 4.0000 mg | Freq: Four times a day (QID) | INTRAMUSCULAR | Status: DC | PRN

## 2017-04-17 NOTE — Consult Note (Signed)
Referring Provider: Reyne Dumas, MD Primary Care Physician:  Raylene Everts, MD Primary Gastroenterologist:  Garfield Cornea, MD  Reason for Consultation:  ugi bleed, lower abd pain  HPI: Shawn [name] is a 66 y.o. male with history of previous alcohol and substance abuse, multiple comorbidities as outlined below who presented to the ED via EMS after a fall at home yesterday. Patient reported lower abdominal pain, episode of hematemesis (coffee ground emesis) yesterday. Complained of dizziness with standing. Felt off balance. Somewhat difficult historian.  Chronically he has diarrhea. 3-4 loose stools daily. Describes nocturnal diarrhea. Denies melena or rectal bleeding. States his abdomen is sore at site of his midline incision. States it doesn't hurt that bad this morning. He recalls falling and hitting his abdomen on the table. Denies nausea. He does have heartburn and solid food dysphagia.  Denies recent alcohol use, IV or intranasal drug use. Remote intranasal cocaine use.  Patient states he takes Aleve or ibuprofen about once daily. He has been on aspirin powders up until about a month ago.  Heme positive stool in the ER. Hemoglobin this morning 13.3. Platelet count 163,000. Albumin 2.9. Other LFTs are normal. BUN 10, creatinine 0.69. Normal   INR back in May 2018.   Pertinent past surgical history In March 2017 patient presented with several day history of abdominal pain.  CT with diffuse abdominal pneumatosis and elevated lactic acid. He went to the OR for exploratory laparotomy but found to have a viable gut without cause for pneumatosis, abrasion of the previous anastomosis site without necrosis. There was no evidence of bowel. Patient was left with open abdomen for second look. Course complicated by MI and third-degree heart block. Repeat surgery 2 days later for reexploration and possible closure. On review previous small bowel anastomosis appeared full-thickness necrosis at  that point. This portion was resected. Abdomen was left open secondary to cardiac issues and VAC sponge was placed. Several days later he had final surgery with small bowel anastomosis and closure of abdomen.  Prior past small bowel resection for incarcerated femoral hernia in perforation of small bowel by Dr. Excell Seltzer in 2013, hernia repair 2016 at [facility].  From the 2013 and 2017 small bowel resections total of 35 cm of the small bowel removed.  Patient believes he has had a remote colonoscopy and EGD at our facility but I can find records.  Prior to Admission medications   Medication Sig Start Date End Date Taking? Authorizing Provider  acetaminophen (TYLENOL) 650 MG CR tablet Take 1,300 mg by mouth every 8 (eight) hours as needed for pain.    [provider]  Artificial Tear Ointment (DRY EYES OP) Apply 1 drop to eye daily as needed (dry eyes).    [provider]  aspirin EC 81 MG tablet Take 1 tablet (81 mg total) by mouth daily. 02/21/17   Raylene Everts, MD  ibuprofen (ADVIL,MOTRIN) 600 MG tablet Take 1 tablet (600 mg total) by mouth every 6 (six) hours as needed. Take with food 02/21/17   Raylene Everts, MD  multivitamin-iron-minerals-folic acid [facility]) TABS tablet Take 1 tablet by mouth daily.    [provider]    Current Facility-Administered Medications  Medication Dose Route Frequency Provider Last Rate Last Dose  . dextrose 5 %-0.9 % sodium chloride infusion   Intravenous Continuous Orvan Falconer, MD 75 mL/hr at 04/17/17 0946    . LORazepam (ATIVAN) injection 1 mg  1 mg Intravenous Q4H PRN Orvan Falconer, MD      .  multivitamin with minerals tablet 1 tablet  1 tablet Oral Daily Orvan Falconer, MD      . ondansetron [facility]) tablet 4 mg  4 mg Oral Q6H PRN Orvan Falconer, MD       Or  . ondansetron [facility]) injection 4 mg  4 mg Intravenous Q6H PRN Orvan Falconer, MD      . pantoprazole (PROTONIX) injection 40 mg  40 mg Intravenous Q12H Orvan Falconer, MD      .  sodium chloride flush (NS) 0.9 % injection 3 mL  3 mL Intravenous Q12H Orvan Falconer, MD   3 mL at 04/17/17 0947    Allergies as of 04/17/2017 - Review Complete 04/17/2017  Allergen Reaction Noted  . Depakote [divalproex sodium] Other (See Comments) 12/08/2013  . Codeine Nausea Only and Other (See Comments) 08/06/2011    Past Medical History:  Diagnosis Date  . Alcoholism ([place])    "Dry" since 2002; relapse 2014  . Allergy   . Anemia   . Anxiety   . Anxiety and depression   . Bipolar disorder [facility])    Has had multiple psych hospitalization in the past, most recently at [facility] 01/31/11-02/04/11.  Says meds made him emotionally blunted. (lamictal and wellbutrin).  . Cancer ([place])    skin  . CHF (congestive heart failure) ([place])   . Chronic kidney disease   . Depression   . Diverticular disease    "itis" x 2 episodes (last was about 2007)  . Fracture of metatarsal bone(s), closed    3rd, 4th, 5th mid shaft on left foot  . History of septic shock 11/2015   ? due to small bowel ischemia: hospitalized + surgeries 11/2015  . History of substance abuse    cocaine, marijuana, acid, alcohol (in remission since 2010)  . Multiple rib fractures 10/09/10   s/p fall down a ravine--9th and 10th on right, contusion of left 7th and 8th ribs  . Osteoarthritis X years   left knee.  Distant hx (age 74) "dislocated" knee and required surgery, then crush injury to patella required knee cap removal.  . Osteoporosis   . Solar dermatitis    face and ears  . Substance abuse   . Tobacco dependence   . Vitamin B12 deficiency 10/2013   IF ab positive.  Replacement therapy started end of March 2015    Past Surgical History:  Procedure Laterality Date  . BOWEL RESECTION  06/15/2012   Procedure: SMALL BOWEL RESECTION;  Surgeon: Edward Jolly, MD;  Location: [facility];  Service: General;  Laterality: N/A;  . BOWEL RESECTION  11/19/2015   Procedure: SMALL BOWEL RESECTION;  Surgeon: Ralene Ok,  MD;  Location: [place];  Service: General;;  . CARDIAC CATHETERIZATION N/A 11/20/2015   Procedure: Temporary Wire;  Surgeon: Evans Lance, MD;  Location: [place] [facility];  Service: Cardiovascular;  Laterality: N/A;  . FEMUR IM NAIL  08/06/2012   Procedure: INTRAMEDULLARY (IM) NAIL FEMORAL;  Surgeon: Johnn Hai, MD;  Location: [facility];  Service: Orthopedics;  Laterality: Left;  . HERNIA REPAIR     at 66 years of age  63  . INGUINAL HERNIA REPAIR     left  . INGUINAL HERNIA REPAIR  06/15/2012   Procedure: HERNIA REPAIR INGUINAL INCARCERATED;  Surgeon: Edward Jolly, MD;  Location: [facility];  Service: General;  Laterality: Right;  . JOINT REPLACEMENT    . KNEE DISLOCATION SURGERY  1966  . LAPAROTOMY N/A 11/17/2015  Procedure: EXPLORATORY LAPAROTOMY;  Surgeon: Donnie Mesa, MD;  Location: [place];  Service: General;  Laterality: N/A;  . LAPAROTOMY N/A 11/19/2015   Procedure: EXPLORATORY LAPAROTOMY OPEN ABDOMEN ABDOMINAL WOUND VAC CHANGE;  Surgeon: Ralene Ok, MD;  Location: [place];  Service: General;  Laterality: N/A;  . [place]   s/p crush injury  . SMALL BOWEL REPAIR N/A 11/21/2015   Procedure: SMALL BOWEL REPAIR, SMALL BOWEL ANATAMOSIS AND ABDOMINAL CLOSURE;  Surgeon: Coralie Keens, MD;  Location: [place];  Service: General;  Laterality: N/A;  . TOTAL KNEE ARTHROPLASTY  11/20/2011   Procedure: TOTAL KNEE ARTHROPLASTY;  Surgeon: Ninetta Lights, MD;  Location: [place];  Service: Orthopedics;  Laterality: Left;  DR Percell Miller WANTS 90MINUTES FOR THIS CASE    Family History  Problem Relation Age of Onset  . Arthritis Mother   . Heart disease Mother   . Arthritis Father   . Alcohol abuse Father   . Cancer Brother        lung cancer.  Died in early 69s.  . Alcohol abuse Son   . Drug abuse Son   . Early death Brother        trauma  . Drug abuse Brother   . Colon cancer Neg Hx     Social History   Social History  . Marital status: Divorced    Spouse name: N/A  .  Number of children: 2  . Years of education: 14   Occupational History  . retired     Writer  .      [employer]   Social History Main Topics  . Smoking status: Current Every Day Smoker    Packs/day: 1.00    Years: 30.00    Types: Cigarettes    Start date: 09/02/1966  . Smokeless tobacco: Never Used  . Alcohol use No     Comment: drinks heavily occassionally, none in 2 weeks.  . Drug use: No  . Sexual activity: Not Currently    Birth control/ protection: None   Other Topics Concern  . Not on file   Social History Narrative   Married, 1 son and 1 daughter.   Lives in [place].   Occupation: parts dep/purschasing/shipping---retired   Tobacco 30 pack-yr hx.  No alcohol currently: quit 10 yrs ago, +alcoholic.   No drug use.   No exercise.    Lives with son [name] - who is an addict     ROS: poor historian  General: Negative for anorexia, weight loss, fever, chills, fatigue, weakness. Eyes: Negative for vision changes.  ENT: Negative for hoarseness,  , nasal congestion. See hpi CV: Negative for chest pain, angina, palpitations, dyspnea on exertion, peripheral edema.  Respiratory: Negative for dyspnea at rest, dyspnea on exertion, cough, sputum, wheezing.  GI: See history of present illness. GU:  Negative for dysuria, hematuria, urinary incontinence, urinary frequency, nocturnal urination.  MS: Negative for joint pain, low back pain.  Derm: Negative for rash or itching.  Neuro: Negative for weakness, abnormal sensation, seizure, frequent headaches, memory loss, confusion. +dizzy Psych: Negative for anxiety, depression, suicidal ideation, hallucinations.  Endo: Negative for unusual weight change.  Heme: Negative for bruising or bleeding. Allergy: Negative for rash or hives.       Physical Examination: Vital signs in last 24 hours: Temp:  [97.7 F (36.5 C)-97.9 F (36.6 C)] 97.7 F (36.5 C) (08/16 0916) Pulse Rate:  [42-70] 42 (08/16 0916) Resp:   [13-22] 18 (08/16 0916) BP: (93-133)/(46-80)  122/59 (08/16 0916) SpO2:  [89 %-96 %] 96 % (08/16 0916) Weight:  [157 lb 8 oz (71.4 kg)-179 lb (81.2 kg)] 157 lb 8 oz (71.4 kg) (08/16 0916)    General: Well-nourished, well-developed bearded white male in no acute distress.  Head: Normocephalic, atraumatic.   Eyes: Conjunctiva pink, no icterus. Mouth: Oropharyngeal mucosa moist and pink , no lesions erythema or exudate. Neck: Supple without thyromegaly, masses, or lymphadenopathy.  Lungs: Clear to auscultation bilaterally.  Heart: Regular rate and rhythm, no murmurs rubs or gallops.  Abdomen: Bowel sounds are normal, nontender, nondistended, no hepatosplenomegaly or masses, no abdominal bruits or    hernia , no rebound or guarding.  Well-healed midline incision lower abdomen. Tenderness over incision. Rectal: Not performed.  Extremities: No lower extremity edema, clubbing, deformity.  Neuro: Alert and oriented x 4 , grossly normal neurologically.  Skin: Warm and dry, no rash or jaundice.   Psych: Alert and cooperative, normal mood and affect.        Intake/Output from previous day: 08/15 0701 - 08/16 0700 In: 1500 [IV Piggyback:1500] Out: -  Intake/Output this shift: No intake/output data recorded.  Lab Results: CBC  Recent Labs  04/17/17 0230  WBC 4.1  HGB 13.3  HCT 39.3  MCV 95.6  PLT 163   BMET  Recent Labs  04/17/17 0230  NA 142  K 3.3*  CL 111  CO2 23  GLUCOSE 89  BUN 10  CREATININE 0.69  CALCIUM 8.2*   LFT  Recent Labs  04/17/17 0230  BILITOT 0.9  ALKPHOS 102  AST 31  ALT 16*  PROT 5.6*  ALBUMIN 2.9*    Lipase No results for input(s): LIPASE in the last 72 hours.  PT/INR No results for input(s): LABPROT, INR in the last 72 hours.    Imaging Studies: Ct Head Wo Contrast  Result Date: 04/17/2017 CLINICAL DATA:  Feeling dizzy since fall yesterday. EXAM: CT HEAD WITHOUT CONTRAST TECHNIQUE: Contiguous axial images were obtained from the base of  the skull through the vertex without intravenous contrast. COMPARISON:  CT HEAD Jan 08, 2017 FINDINGS: BRAIN: No intraparenchymal hemorrhage, mass effect nor midline shift. Moderate to severe ventriculomegaly on the basis of global parenchymal brain volume loss as there is commensurate enlargement of cerebral sulci and cerebellar folia. Patchy to confluent supratentorial white matter hypodensities. No acute large vascular territory infarcts. No abnormal extra-axial fluid collections. Basal cisterns are patent. Extensive dural calcifications. VASCULAR: Moderate calcific atherosclerosis of the carotid siphons. SKULL: No skull fracture. No significant scalp soft tissue swelling. SINUSES/ORBITS: The mastoid air-cells and included paranasal sinuses are well-aerated.The included ocular globes and orbital contents are non-suspicious. OTHER: None. IMPRESSION: 1. No acute intracranial process. 2. Stable examination including moderate to severe atrophy and moderate chronic small vessel ischemic disease. Electronically Signed   By: Elon Alas M.D.   On: 04/17/2017 03:01  [4 week]   Impression: 66 year old gentleman presenting with dizziness, fall, reported lower abdominal pain and coffee-ground emesis. Heme positive stool. Hemoglobin is stable. He reports aspirin powder use up until a month ago. He uses Aleve once daily. Chronically has diarrhea, possibly due to small bowel resections. Complains of solid food dysphagia.   It is unclear if his lower abdominal pain is related to his fall or chronically from his previous surgeries. He has minimal tenderness on exam today. Suspect upper GI bleed in the setting of NSAIDs/aspirin use, possible gastritis versus peptic ulcer disease. May have reflux esophagitis and/or esophageal stricture as well. He  has been hemodynamically stable. Hemoglobin normal. There is no objective indication of chronic advanced liver disease, no thrombocytopenia and recent normal  INR.  Plan: 1. Consider EGD with deep sedation in the OR to evaluate upper GI bleed. May require esophageal dilation for history of dysphagia. 2. Would advise colonoscopy at some point in the future. 3. He may or may not require CT imaging for abdominal pain, follow closely for now. 4. IV PPI twice a day for now.  We would like to thank you for the opportunity to participate in the care of Shawn [name].  Laureen Ochs. Bernarda Caffey [facility] [phone] 8/16/201810:30 AM     LOS: 0 days

## 2017-04-17 NOTE — ED Triage Notes (Addendum)
Pt brought in by [facility] for c/o lower abdominal pain that started yesterday; pt states he has vomited x one that was dark brown in color; pt states he fell yesterday morning and states he has been feeling dizzy since the fall

## 2017-04-17 NOTE — Anesthesia Postprocedure Evaluation (Signed)
Anesthesia Post Note  Patient: Shawn [name]  Procedure(s) Performed: Procedure(s) (LRB): ESOPHAGOGASTRODUODENOSCOPY (EGD) WITH PROPOFOL (N/A)  Patient location during evaluation: PACU Anesthesia Type: MAC Level of consciousness: awake and alert Pain management: satisfactory to patient Vital Signs Assessment: post-procedure vital signs reviewed and stable Respiratory status: spontaneous breathing and patient connected to nasal cannula oxygen Cardiovascular status: stable Postop Assessment: no signs of nausea or vomiting Anesthetic complications: no     Last Vitals:  Vitals:   04/17/17 1555 04/17/17 1600  BP: (!) 73/50 (!) 84/61  Pulse: (!) 52 (!) 44  Resp: (!) 21 19  Temp: 36.6 C   SpO2: 95% 94%    Last Pain:  Vitals:   04/17/17 1336  TempSrc: Oral  PainSc:                  Drucie Opitz

## 2017-04-17 NOTE — Progress Notes (Addendum)
Patient seen and examined  No recurrent upper GI bleeding since admission Noted that he is bradycardic but asx, will check TSH  Tolerating clear liquids Ordered CT abdomen pelvis to further evaluate cause of abdominal pain

## 2017-04-17 NOTE — ED Notes (Signed)
Pt's dizziness has improved

## 2017-04-17 NOTE — Anesthesia Procedure Notes (Signed)
Procedure Name: MAC Date/Time: 04/17/2017 3:31 PM Performed by: Vista Deck Pre-anesthesia Checklist: Patient identified, Emergency Drugs available, Suction available, Timeout performed and Patient being monitored Patient Re-evaluated:Patient Re-evaluated prior to induction Oxygen Delivery Method: Non-rebreather mask

## 2017-04-17 NOTE — Anesthesia Preprocedure Evaluation (Signed)
Anesthesia Evaluation  Patient identified by MRN, date of birth, ID band Patient awake    Reviewed: Allergy & Precautions, NPO status , Patient's Chart, lab work & pertinent test results  Airway Mallampati: Intubated       Dental  (+) Poor Dentition, Edentulous Upper, Missing, Chipped, Loose   Pulmonary Current Smoker,    breath sounds clear to auscultation       Cardiovascular +CHF  negative cardio ROS  + dysrhythmias  Rhythm:regular Rate:Bradycardia     Neuro/Psych PSYCHIATRIC DISORDERS Anxiety Depression Bipolar Disorder    GI/Hepatic Hematemesis    Endo/Other    Renal/GU Renal disease     Musculoskeletal  (+) Arthritis ,   Abdominal   Peds  Hematology  (+) anemia ,   Anesthesia Other Findings   Reproductive/Obstetrics                             Anesthesia Physical Anesthesia Plan  ASA: III  Anesthesia Plan: MAC   Post-op Pain Management:    Induction: Intravenous  PONV Risk Score and Plan:   Airway Management Planned: Simple Face Mask  Additional Equipment:   Intra-op Plan:   Post-operative Plan:   Informed Consent: I have reviewed the patients History and Physical, chart, labs and discussed the procedure including the risks, benefits and alternatives for the proposed anesthesia with the patient or authorized representative who has indicated his/her understanding and acceptance.     Plan Discussed with:   Anesthesia Plan Comments:         Anesthesia Quick Evaluation

## 2017-04-17 NOTE — ED Provider Notes (Signed)
[facility] DEPT Provider Note   CSN: [id number] Arrival date & time: 04/17/17  0037  Time seen 1:10 AM   History   Chief Complaint Chief Complaint  Patient presents with  . Abdominal Pain    HPI Shawn [name] is a 66 y.o. male.  HPI  Patient states he has been having dizziness. He states about 4:30 this afternoon whenever he would stand up he would feel off balance and if he would sit down it would get better. He states he did fall once and has a bruise on the top of his left hand. He also states he fell once and he hit his abdomen on the side of the table about 10 AM. He also states he has spinning and he did have a achy frontal headache that is gone now. He denies numbness or tingling of his extremities. He states he had nausea and vomited once and describes coffee grounds this morning but not since. He states he gets nausea with the dizzy feeling.  PCP Raylene Everts, MD   Past Medical History:  Diagnosis Date  . Alcoholism ([place])    "Dry" since 2002; relapse 2014  . Allergy   . Anemia   . Anxiety   . Anxiety and depression   . Bipolar disorder [facility])    Has had multiple psych hospitalization in the past, most recently at [facility] 01/31/11-02/04/11.  Says meds made him emotionally blunted. (lamictal and wellbutrin).  . Cancer ([place])    skin  . CHF (congestive heart failure) ([place])   . Chronic kidney disease   . Depression   . Diverticular disease    "itis" x 2 episodes (last was about 2007)  . Fracture of metatarsal bone(s), closed    3rd, 4th, 5th mid shaft on left foot  . History of septic shock 11/2015   ? due to small bowel ischemia: hospitalized + surgeries 11/2015  . History of substance abuse    cocaine, marijuana, acid, alcohol (in remission since 2010)  . Multiple rib fractures 10/09/10   s/p fall down a ravine--9th and 10th on right, contusion of left 7th and 8th ribs  . Osteoarthritis X years   left knee.  Distant hx (age 52) "dislocated" knee  and required surgery, then crush injury to patella required knee cap removal.  . Osteoporosis   . Solar dermatitis    face and ears  . Substance abuse   . Tobacco dependence   . Vitamin B12 deficiency 10/2013   IF ab positive.  Replacement therapy started end of March 2015    Patient Active Problem List   Diagnosis Date Noted  . RBBB 02/21/2017  . Osteopenia determined by x-ray 02/21/2017  . Vitamin B 12 deficiency 02/21/2017  . History of smoking 30 or more pack years 02/21/2017  . Vitamin D deficiency 02/21/2017  . Debilitated 11/29/2015  . Paroxysmal atrial fibrillation (H[facility])   . Acute on chronic combined systolic and diastolic CHF (congestive heart failure) ([place])   . Cardiomyopathy, ischemic 11/19/2015  . Cognitive dysfunction, alcohol-related ([place]) 01/03/2014  . Periodontal disease 12/24/2013  . Alcohol dependence ([place]) 04/12/2013  . Bipolar I disorder, most recent episode depressed ([place]) 04/12/2013  . H/O: substance abuse 08/06/2012  . Tobacco abuse 08/06/2012  . History of hernia repair 08/06/2012  . Prostate cancer screening 09/19/2011  . History of depression 08/06/2011    Past Surgical History:  Procedure Laterality Date  . BOWEL RESECTION  06/15/2012   Procedure: SMALL BOWEL  RESECTION;  Surgeon: Edward Jolly, MD;  Location: [facility];  Service: General;  Laterality: N/A;  . BOWEL RESECTION  11/19/2015   Procedure: SMALL BOWEL RESECTION;  Surgeon: Ralene Ok, MD;  Location: [facility];  Service: General;;  . CARDIAC CATHETERIZATION N/A 11/20/2015   Procedure: Temporary Wire;  Surgeon: Evans Lance, MD;  Location: [facility];  Service: Cardiovascular;  Laterality: N/A;  . FEMUR IM NAIL  08/06/2012   Procedure: INTRAMEDULLARY (IM) NAIL FEMORAL;  Surgeon: Johnn Hai, MD;  Location: [facility];  Service: Orthopedics;  Laterality: Left;  . HERNIA REPAIR     at 66 years of age  42  . INGUINAL HERNIA REPAIR     left  . INGUINAL HERNIA REPAIR  06/15/2012     Procedure: HERNIA REPAIR INGUINAL INCARCERATED;  Surgeon: Edward Jolly, MD;  Location: [facility];  Service: General;  Laterality: Right;  . JOINT REPLACEMENT    . KNEE DISLOCATION SURGERY  1966  . LAPAROTOMY N/A 11/17/2015   Procedure: EXPLORATORY LAPAROTOMY;  Surgeon: Donnie Mesa, MD;  Location: [facility];  Service: General;  Laterality: N/A;  . LAPAROTOMY N/A 11/19/2015   Procedure: EXPLORATORY LAPAROTOMY OPEN ABDOMEN ABDOMINAL WOUND VAC CHANGE;  Surgeon: Ralene Ok, MD;  Location: [facility];  Service: General;  Laterality: N/A;  . [place]   s/p crush injury  . SMALL BOWEL REPAIR N/A 11/21/2015   Procedure: SMALL BOWEL REPAIR, SMALL BOWEL ANATAMOSIS AND ABDOMINAL CLOSURE;  Surgeon: Coralie Keens, MD;  Location: [facility];  Service: General;  Laterality: N/A;  . TOTAL KNEE ARTHROPLASTY  11/20/2011   Procedure: TOTAL KNEE ARTHROPLASTY;  Surgeon: Ninetta Lights, MD;  Location: [facility];  Service: Orthopedics;  Laterality: Left;  DR Percell Miller WANTS 90MINUTES FOR THIS CASE       Home Medications    Prior to Admission medications   Medication Sig Start Date End Date Taking? Authorizing Provider  acetaminophen (TYLENOL) 650 MG CR tablet Take 1,300 mg by mouth every 8 (eight) hours as needed for pain.    [provider]  Artificial Tear Ointment (DRY EYES OP) Apply 1 drop to eye daily as needed (dry eyes).    [provider]  aspirin EC 81 MG tablet Take 1 tablet (81 mg total) by mouth daily. 02/21/17   Raylene Everts, MD  ibuprofen (ADVIL,MOTRIN) 600 MG tablet Take 1 tablet (600 mg total) by mouth every 6 (six) hours as needed. Take with food 02/21/17   Raylene Everts, MD  multivitamin-iron-minerals-folic acid [facility]) TABS tablet Take 1 tablet by mouth daily.    [provider]    Family History Family History  Problem Relation Age of Onset  . Arthritis Mother   . Heart disease Mother   . Arthritis Father   . Alcohol abuse Father   .  Cancer Brother        lung cancer.  Died in early 56s.  . Alcohol abuse Son   . Drug abuse Son   . Early death Brother        trauma  . Drug abuse Brother     Social History Social History  Substance Use Topics  . Smoking status: Current Every Day Smoker    Packs/day: 1.00    Years: 30.00    Types: Cigarettes    Start date: 09/02/1966  . Smokeless tobacco: Never Used  . Alcohol use No     Comment: drinks heavily occassionally, none in 2 weeks.  lives at home Lives with son Uses a cane   Allergies   Depakote [divalproex sodium] and Codeine   Review of Systems Review of Systems  All other systems reviewed and are negative.    Physical Exam Updated Vital Signs BP 122/75   Pulse (!) 58   Temp 97.9 F (36.6 C) (Oral)   Resp 13   Ht 6' (1.829 m)   Wt 81.2 kg (179 lb)   SpO2 94%   BMI 24.28 kg/m   Vital signs normal    Physical Exam  Constitutional: He is oriented to person, place, and time. He appears well-developed and well-nourished.  Non-toxic appearance. He does not appear ill. No distress.  HENT:  Head: Normocephalic and atraumatic.  Right Ear: External ear normal.  Left Ear: External ear normal.  Nose: Nose normal. No mucosal edema or rhinorrhea.  Mouth/Throat: Oropharynx is clear and moist and mucous membranes are normal. No dental abscesses or uvula swelling.  Eyes: Pupils are equal, round, and reactive to light. Conjunctivae and EOM are normal.  Neck: Normal range of motion and full passive range of motion without pain. Neck supple.  Cardiovascular: Normal rate, regular rhythm and normal heart sounds.  Exam reveals no gallop and no friction rub.   No murmur heard. Pulmonary/Chest: Effort normal and breath sounds normal. No respiratory distress. He has no wheezes. He has no rhonchi. He has no rales. He exhibits no tenderness and no crepitus.  Abdominal: Soft. Normal appearance and bowel sounds are normal. He exhibits no distension. There is no  tenderness. There is no rebound and no guarding.  Musculoskeletal: Normal range of motion. He exhibits no edema or tenderness.  Moves all extremities well.   Neurological: He is alert and oriented to person, place, and time. He has normal strength. No cranial nerve deficit.  Skin: Skin is warm, dry and intact. No rash noted. No erythema. There is pallor.  Psychiatric: He has a normal mood and affect. His speech is normal and behavior is normal. His mood appears not anxious.  Nursing note and vitals reviewed.    ED Treatments / Results  Labs (all labs ordered are listed, but only abnormal results are displayed) Results for orders placed or performed during the hospital encounter of 04/17/17  Comprehensive metabolic panel  Result Value Ref Range   Sodium 142 135 - 145 mmol/L   Potassium 3.3 (L) 3.5 - 5.1 mmol/L   Chloride 111 101 - 111 mmol/L   CO2 23 22 - 32 mmol/L   Glucose, Bld 89 65 - 99 mg/dL   BUN 10 6 - 20 mg/dL   Creatinine, Ser 0.69 0.61 - 1.24 mg/dL   Calcium 8.2 (L) 8.9 - 10.3 mg/dL   Total Protein 5.6 (L) 6.5 - 8.1 g/dL   Albumin 2.9 (L) 3.5 - 5.0 g/dL   AST 31 15 - 41 U/L   ALT 16 (L) 17 - 63 U/L   Alkaline Phosphatase 102 38 - 126 U/L   Total Bilirubin 0.9 0.3 - 1.2 mg/dL   GFR calc non Af Amer >60 >60 mL/min   GFR calc Af Amer >60 >60 mL/min   Anion gap 8 5 - 15  CBC with Differential  Result Value Ref Range   WBC 4.1 4.0 - 10.5 K/uL   RBC 4.11 (L) 4.22 - 5.81 MIL/uL   Hemoglobin 13.3 13.0 - 17.0 g/dL   HCT 39.3 39.0 - 52.0 %   MCV 95.6 78.0 - 100.0 fL   MCH  32.4 26.0 - 34.0 pg   MCHC 33.8 30.0 - 36.0 g/dL   RDW 15.1 11.5 - 15.5 %   Platelets 163 150 - 400 K/uL   Neutrophils Relative % 75 %   Neutro Abs 3.1 1.7 - 7.7 K/uL   Lymphocytes Relative 16 %   Lymphs Abs 0.6 (L) 0.7 - 4.0 K/uL   Monocytes Relative 7 %   Monocytes Absolute 0.3 0.1 - 1.0 K/uL   Eosinophils Relative 1 %   Eosinophils Absolute 0.1 0.0 - 0.7 K/uL   Basophils Relative 1 %    Basophils Absolute 0.0 0.0 - 0.1 K/uL  Ethanol  Result Value Ref Range   Alcohol, Ethyl (B) <5 <5 mg/dL   Laboratory interpretation all normal except Malnutrition and hypokalemia    EKG  EKG Interpretation  Date/Time:  Thursday April 17 2017 00:50:54 EDT Ventricular Rate:  58 PR Interval:    QRS Duration: 158 QT Interval:  457 QTC Calculation: 449 R Axis:   93 Text Interpretation:  Sinus rhythm Right bundle branch block No significant change since last tracing 08 Jan 2017 Confirmed by Rolland Porter [phone]) on 04/17/2017 12:56:00 AM       Radiology Ct Head Wo Contrast  Result Date: 04/17/2017 CLINICAL DATA:  Feeling dizzy since fall yesterday. EXAM: CT HEAD WITHOUT CONTRAST TECHNIQUE: Contiguous axial images were obtained from the base of the skull through the vertex without intravenous contrast. COMPARISON:  CT HEAD Jan 08, 2017 FINDINGS: BRAIN: No intraparenchymal hemorrhage, mass effect nor midline shift. Moderate to severe ventriculomegaly on the basis of global parenchymal brain volume loss as there is commensurate enlargement of cerebral sulci and cerebellar folia. Patchy to confluent supratentorial white matter hypodensities. No acute large vascular territory infarcts. No abnormal extra-axial fluid collections. Basal cisterns are patent. Extensive dural calcifications. VASCULAR: Moderate calcific atherosclerosis of the carotid siphons. SKULL: No skull fracture. No significant scalp soft tissue swelling. SINUSES/ORBITS: The mastoid air-cells and included paranasal sinuses are well-aerated.The included ocular globes and orbital contents are non-suspicious. OTHER: None. IMPRESSION: 1. No acute intracranial process. 2. Stable examination including moderate to severe atrophy and moderate chronic small vessel ischemic disease. Electronically Signed   By: Elon Alas M.D.   On: 04/17/2017 03:01    Procedures Procedures (including critical care time)  Medications Ordered in  ED Medications  meclizine (ANTIVERT) tablet 25 mg (25 mg Oral Given 04/17/17 0233)  ondansetron (ZOFRAN) injection 4 mg (4 mg Intravenous Given 04/17/17 0234)  sodium chloride 0.9 % bolus 1,000 mL (0 mLs Intravenous Stopped 04/17/17 0305)  sodium chloride 0.9 % bolus 500 mL (0 mLs Intravenous Stopped 04/17/17 0305)     Initial Impression / Assessment and Plan / ED Course  I have reviewed the triage vital signs and the nursing notes.  Pertinent labs & imaging results that were available during my care of the patient were reviewed by me and considered in my medical decision making (see chart for details).    Patient was given IV fluids, he was also given IV Zofran for his nausea and meclizine for his vertigo or spinning sensation.CT scan was done to look for acute stroke.  Nurse reports on rectal exam he had gross blood on his finger.  Recheck at 3:45 AM patient states he's feeling much better as far as the dizziness. He denies abdominal pain, patient mainly wants to eat.  05:00 pt is eating in his room, it was not ordered by me. Pt states now his abdominal pain only lasted  an hour this morning. However my concern is patient had coffee-ground emesis and nurse reports on rectal exam he had a bloody finger so he obviously had a positive Hemoccult. Patient has a history of alcohol abuse. He may have gastritis or gastric ulcer.He should be admitted. Patient states that if the hospitalist thought he should be admitted he would stay.  5:57 AM Dr. Marin Comment, hospitalist is going to admit to observation.   Final Clinical Impressions(s) / ED Diagnoses   Final diagnoses:  Vertigo  Abdominal pain, unspecified abdominal location  Coffee ground emesis  Rectal bleeding    Plan admission  Rolland Porter, MD, Barbette Or, MD 04/17/17 [phone]

## 2017-04-17 NOTE — ED Notes (Signed)
PT HAD A POSITIVE HEMOCCULT DR KNAPP NOTIFIED

## 2017-04-17 NOTE — Transfer of Care (Signed)
Immediate Anesthesia Transfer of Care Note  Patient: Shawn [name]  Procedure(s) Performed: Procedure(s): ESOPHAGOGASTRODUODENOSCOPY (EGD) WITH PROPOFOL (N/A)  Patient Location: PACU  Anesthesia Type:MAC  Level of Consciousness: awake and patient cooperative  Airway & Oxygen Therapy: Patient Spontanous Breathing and Patient connected to nasal cannula oxygen  Post-op Assessment: Report given to RN and Post -op Vital signs reviewed and stable  Post vital signs: Reviewed and stable  Last Vitals:  Vitals:   04/17/17 1520 04/17/17 1525  BP:  113/64  Pulse:    Resp: (!) 26 18  Temp:    SpO2: 98% 97%    Last Pain:  Vitals:   04/17/17 1336  TempSrc: Oral  PainSc:          Complications: No apparent anesthesia complications

## 2017-04-17 NOTE — Op Note (Signed)
[facility] Patient Name: Shawn [name] Procedure Date: 04/17/2017 3:08 PM MRN: [id number] Date of Birth: [birth year] Attending MD: Norvel Richards , MD CSN: [id number] Age: 66 Admit Type: Inpatient Procedure:                Upper GI endoscopy Indications:              Coffee-ground emesis Providers:                Norvel Richards, MD, Lurline Del, RN, Randa Spike, Technician Referring MD:              Medicines:                Propofol per Anesthesia Complications:            No immediate complications. Estimated Blood Loss:     Estimated blood loss: none. Procedure:                Pre-Anesthesia Assessment:                           - Prior to the procedure, a History and Physical                            was performed, and patient medications and                            allergies were reviewed. The patient's tolerance of                            previous anesthesia was also reviewed. The risks                            and benefits of the procedure and the sedation                            options and risks were discussed with the patient.                            All questions were answered, and informed consent                            was obtained. Prior Anticoagulants: The patient has                            taken no previous anticoagulant or antiplatelet                            agents. ASA Grade Assessment: III - A patient with                            severe systemic disease. After reviewing the risks  and benefits, the patient was deemed in                            satisfactory condition to undergo the procedure.                           After obtaining informed consent, the endoscope was                            passed under direct vision. Throughout the                            procedure, the patient's blood pressure, pulse, and                            oxygen saturations  were monitored continuously. The                            EG-[id number] ([id number]) scope was introduced through the                            and advanced to the second part of duodenum. The                            upper GI endoscopy was accomplished without                            difficulty. The patient tolerated the procedure                            well. Scope In: 3:44:06 PM Scope Out: 3:48:28 PM Total Procedure Duration: 0 hours 4 minutes 22 seconds  Findings:      Esophagitis was found. circumferential distal esophageal erosions       wition. Non-obstructing schatzkis ring; No Barrett's epithelium seen.      A small hiatal hernia was present.      The stomach was otherwise without abnormality.      The duodenal bulb and second portion of the duodenum were normal aside       from a tiny 2 mm erosion in the middle of D2.. Impression:               - Reflux Esophagitis - likely source of trivial                            bleeding.. Schatzki's ring?"not manipulated. Patient                            denies any dysphagia whatsoever this afternoon upon                            repeated questioning in spite of what was reported                            earlier today.                           -  Small hiatal hernia.                           - The examination was otherwise normal.                           - Normal duodenal bulb and second portion of the                            duodenum (single tiny erosion).                           - No specimens collected. Moderate Sedation:      Moderate (conscious) sedation was personally administered by an       anesthesia professional. The following parameters were monitored: oxygen       saturation, heart rate, blood pressure, respiratory rate, EKG, adequacy       of pulmonary ventilation, and response to care. Total physician       intraservice time was 14 minutes. Recommendation:           - Return patient to hospital ward for  observation.                           - Advance diet as tolerated.                           - Continue present medications. Continue twice a                            day PPI therapy. From a GI standpoint, could likely                            be discharged in next 24 hours                           - No repeat upper endoscopy.                           - Return to GI office in 2 months. Procedure Code(s):        --- Professional ---                           [id number], Esophagogastroduodenoscopy, flexible,                            transoral; diagnostic, including collection of                            specimen(s) by brushing or washing, when performed                            (separate procedure) Diagnosis Code(s):        --- Professional ---                           K20.9, Esophagitis, unspecified  K44.9, Diaphragmatic hernia without obstruction or                            gangrene                           K92.0, Hematemesis CPT copyright 2016 [employer]. All rights reserved. The codes documented in this report are preliminary and upon coder review may  be revised to meet current compliance requirements. Shawn [name]. Shawn Zurawski, MD Norvel Richards, MD 04/17/2017 4:03:45 PM This report has been signed electronically. Number of Addenda: 0

## 2017-04-17 NOTE — H&P (Signed)
History and Physical    Shawn [name] SWH:[id number] DOB: [birth year] DOA: 04/17/2017  PCP: Raylene Everts, MD  Patient coming from: Home.    Chief Complaint: lower abdominal pain, coffee ground emesis and vertigo.   HPI: Shawn [name] is an 66 y.o. 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  He also has vertigo, and with it, nausea.  He vomited coffee ground emesis, but no blood.  He has been taking aleve for his abdominal pain.  He had no diarrhea, black or bloody stool, and had no fever.  Evaluation in the ER included a negative head CT.  Stable hemodynamics, and stool was OB positive. His Hb was 13 g per dL, normal BUN and normal Cr.  He was given some antivert and felt much better.  Hospitalist was asked to admit him OBS for upper GI bleed, suspicious of NSAIDS induced gastritis or PUD with no evidence of significant bleeding.   ED Course:  See above.  Rewiew of Systems:  Constitutional: Negative for malaise, fever and chills. No significant weight loss or weight gain Eyes: Negative for eye pain, redness and discharge, diplopia, visual changes, or flashes of light. ENMT: Negative for ear pain, hoarseness, nasal congestion, sinus pressure and sore throat. No headaches; tinnitus, drooling, or problem swallowing. Cardiovascular: Negative for chest pain, palpitations, diaphoresis, dyspnea and peripheral edema. ; No orthopnea, PND Respiratory: Negative for cough, hemoptysis, wheezing and stridor. No pleuritic chestpain. Gastrointestinal: Negative for diarrhea, constipation,  melena, blood in stool, hematemesis, jaundice and rectal bleeding.    Genitourinary: Negative for frequency, dysuria, incontinence,flank pain and  hematuria; Musculoskeletal: Negative for back pain and neck pain. Negative for swelling and trauma.;  Skin: . Negative for pruritus, rash, abrasions, bruising and skin lesion.; ulcerations Neuro: Negative for headache, lightheadedness and neck stiffness. Negative for weakness, altered level of consciousness , altered mental status, extremity weakness, burning feet, involuntary movement, seizure and syncope.  Psych: negative for anxiety, depression, insomnia, tearfulness, panic attacks, hallucinations, paranoia, suicidal or homicidal ideation   Past Medical History:  Diagnosis Date  . Alcoholism ([place])    "Dry" since 2002; relapse 2014  . Allergy   . Anemia   . Anxiety   . Anxiety and depression   . Bipolar disorder [facility])    Has had multiple psych hospitalization in the past, most recently at [facility] 01/31/11-02/04/11.  Says meds made him emotionally blunted. (lamictal and wellbutrin).  . Cancer ([place])    skin  . CHF (congestive heart failure) ([place])   . Chronic kidney disease   . Depression   . Diverticular disease    "itis" x 2 episodes (last was about 2007)  . Fracture of metatarsal bone(s), closed    3rd, 4th, 5th mid shaft on left foot  . History of septic shock 11/2015   ? due to small bowel ischemia: hospitalized + surgeries 11/2015  . History of substance abuse    cocaine, marijuana, acid, alcohol (in remission since 2010)  . Multiple rib fractures 10/09/10   s/p fall down a ravine--9th and 10th on right, contusion of left 7th and 8th ribs  . Osteoarthritis X years   left knee.  Distant hx (age 76) "dislocated" knee and required surgery, then crush injury  to patella required knee cap removal.  . Osteoporosis   . Solar dermatitis    face and ears  . Substance abuse   . Tobacco dependence   . Vitamin B12 deficiency 10/2013   IF ab positive.  Replacement therapy started end of March 2015    Past Surgical History:  Procedure Laterality Date  . BOWEL RESECTION   06/15/2012   Procedure: SMALL BOWEL RESECTION;  Surgeon: Edward Jolly, MD;  Location: [facility];  Service: General;  Laterality: N/A;  . BOWEL RESECTION  11/19/2015   Procedure: SMALL BOWEL RESECTION;  Surgeon: Ralene Ok, MD;  Location: [place];  Service: General;;  . CARDIAC CATHETERIZATION N/A 11/20/2015   Procedure: Temporary Wire;  Surgeon: Evans Lance, MD;  Location: [place] [facility];  Service: Cardiovascular;  Laterality: N/A;  . FEMUR IM NAIL  08/06/2012   Procedure: INTRAMEDULLARY (IM) NAIL FEMORAL;  Surgeon: Johnn Hai, MD;  Location: [facility];  Service: Orthopedics;  Laterality: Left;  . HERNIA REPAIR     at 66 years of age  10  . INGUINAL HERNIA REPAIR     left  . INGUINAL HERNIA REPAIR  06/15/2012   Procedure: HERNIA REPAIR INGUINAL INCARCERATED;  Surgeon: Edward Jolly, MD;  Location: [facility];  Service: General;  Laterality: Right;  . JOINT REPLACEMENT    . KNEE DISLOCATION SURGERY  1966  . LAPAROTOMY N/A 11/17/2015   Procedure: EXPLORATORY LAPAROTOMY;  Surgeon: Donnie Mesa, MD;  Location: [place];  Service: General;  Laterality: N/A;  . LAPAROTOMY N/A 11/19/2015   Procedure: EXPLORATORY LAPAROTOMY OPEN ABDOMEN ABDOMINAL WOUND VAC CHANGE;  Surgeon: Ralene Ok, MD;  Location: [place];  Service: General;  Laterality: N/A;  . [place]   s/p crush injury  . SMALL BOWEL REPAIR N/A 11/21/2015   Procedure: SMALL BOWEL REPAIR, SMALL BOWEL ANATAMOSIS AND ABDOMINAL CLOSURE;  Surgeon: Coralie Keens, MD;  Location: [place];  Service: General;  Laterality: N/A;  . TOTAL KNEE ARTHROPLASTY  11/20/2011   Procedure: TOTAL KNEE ARTHROPLASTY;  Surgeon: Ninetta Lights, MD;  Location: [place];  Service: Orthopedics;  Laterality: Left;  DR MURPHY WANTS 90MINUTES FOR THIS CASE     reports that he has been smoking Cigarettes.  He started smoking about 50 years ago. He has a 30.00 pack-year smoking history. He has never used smokeless tobacco. He reports that he does not  drink alcohol or use drugs.  Allergies  Allergen Reactions  . Depakote [Divalproex Sodium] Other (See Comments)    "Makes me crazy"  . Codeine Nausea Only and Other (See Comments)    flushing    Family History  Problem Relation Age of Onset  . Arthritis Mother   . Heart disease Mother   . Arthritis Father   . Alcohol abuse Father   . Cancer Brother        lung cancer.  Died in early 33s.  . Alcohol abuse Son   . Drug abuse Son   . Early death Brother        trauma  . Drug abuse Brother      Prior to Admission medications   Medication Sig Start Date End Date Taking? Authorizing Provider  acetaminophen (TYLENOL) 650 MG CR tablet Take 1,300 mg by mouth every 8 (eight) hours as needed for pain.    [provider]  Artificial Tear Ointment (DRY EYES OP) Apply 1 drop to eye daily as needed (dry eyes).    [provider]  aspirin EC 81 MG tablet Take 1 tablet (81 mg total) by mouth daily. 02/21/17   Raylene Everts, MD  ibuprofen (ADVIL,MOTRIN) 600 MG tablet Take 1 tablet (600 mg total) by mouth every 6 (six) hours as needed. Take with food 02/21/17   Raylene Everts, MD  multivitamin-iron-minerals-folic acid [facility]) TABS tablet Take 1 tablet by mouth daily.    [provider]    Physical Exam: Vitals:   04/17/17 0430 04/17/17 0500 04/17/17 0541 04/17/17 0600  BP: 132/77 95/67 (!) 97/59 (!) 96/56  Pulse: 70 (!) 57 (!) 59 (!) 51  Resp: (!) 22 19 17 19   Temp:      TempSrc:      SpO2: 94% 94% 94% 94%  Weight:      Height:          Constitutional: NAD, calm, comfortable Vitals:   04/17/17 0430 04/17/17 0500 04/17/17 0541 04/17/17 0600  BP: 132/77 95/67 (!) 97/59 (!) 96/56  Pulse: 70 (!) 57 (!) 59 (!) 51  Resp: (!) 22 19 17 19   Temp:      TempSrc:      SpO2: 94% 94% 94% 94%  Weight:      Height:       Eyes: PERRL, lids and conjunctivae normal ENMT: Mucous membranes are moist. Posterior pharynx clear of any exudate or  lesions.Normal dentition.  Neck: normal, supple, no masses, no thyromegaly Respiratory: clear to auscultation bilaterally, no wheezing, no crackles. Normal respiratory effort. No accessory muscle use.  Cardiovascular: Regular rate and rhythm, no murmurs / rubs / gallops. No extremity edema. 2+ pedal pulses. No carotid bruits.  Abdomen: no tenderness, no masses palpated. No hepatosplenomegaly. Bowel sounds positive.  Musculoskeletal: no clubbing / cyanosis. No joint deformity upper and lower extremities. Good ROM, no contractures. Normal muscle tone.  Skin: no rashes, lesions, ulcers. No induration Neurologic: CN 2-12 grossly intact. Sensation intact, DTR normal. Strength 5/5 in all 4.  Psychiatric: Normal judgment and insight. Alert and oriented x 3. Normal mood.    Labs on Admission: I have personally reviewed following labs and imaging studies CBC:  Recent Labs Lab 04/17/17 0230  WBC 4.1  NEUTROABS 3.1  HGB 13.3  HCT 39.3  MCV 95.6  PLT 102   Basic Metabolic Panel:  Recent Labs Lab 04/17/17 0230  NA 142  K 3.3*  CL 111  CO2 23  GLUCOSE 89  BUN 10  CREATININE 0.69  CALCIUM 8.2*   GFR: Estimated Creatinine Clearance: 97 mL/min (by C-G formula based on SCr of 0.69 mg/dL). Liver Function Tests:  Recent Labs Lab 04/17/17 0230  AST 31  ALT 16*  ALKPHOS 102  BILITOT 0.9  PROT 5.6*  ALBUMIN 2.9*   Urine analysis:    Component Value Date/Time   COLORURINE DARK YELLOW 02/21/2017 1112   APPEARANCEUR CLEAR 02/21/2017 1112   LABSPEC 1.012 02/21/2017 1112   PHURINE 6.0 02/21/2017 1112   GLUCOSEU NEGATIVE 02/21/2017 1112   HGBUR NEGATIVE 02/21/2017 1112   BILIRUBINUR NEGATIVE 02/21/2017 1112   KETONESUR NEGATIVE 02/21/2017 1112   PROTEINUR 1+ (A) 02/21/2017 1112   UROBILINOGEN 1.0 11/16/2013 1822   NITRITE NEGATIVE 02/21/2017 1112   LEUKOCYTESUR NEGATIVE 02/21/2017 1112    Radiological Exams on Admission: Ct Head Wo Contrast  Result Date: 04/17/2017 CLINICAL  DATA:  Feeling dizzy since fall yesterday. EXAM: CT HEAD WITHOUT CONTRAST TECHNIQUE: Contiguous axial images were obtained from the base of the skull through the vertex without intravenous  contrast. COMPARISON:  CT HEAD Jan 08, 2017 FINDINGS: BRAIN: No intraparenchymal hemorrhage, mass effect nor midline shift. Moderate to severe ventriculomegaly on the basis of global parenchymal brain volume loss as there is commensurate enlargement of cerebral sulci and cerebellar folia. Patchy to confluent supratentorial white matter hypodensities. No acute large vascular territory infarcts. No abnormal extra-axial fluid collections. Basal cisterns are patent. Extensive dural calcifications. VASCULAR: Moderate calcific atherosclerosis of the carotid siphons. SKULL: No skull fracture. No significant scalp soft tissue swelling. SINUSES/ORBITS: The mastoid air-cells and included paranasal sinuses are well-aerated.The included ocular globes and orbital contents are non-suspicious. OTHER: None. IMPRESSION: 1. No acute intracranial process. 2. Stable examination including moderate to severe atrophy and moderate chronic small vessel ischemic disease. Electronically Signed   By: Elon Alas M.D.   On: 04/17/2017 03:01    EKG: Independently reviewed.  Assessment/Plan Principal Problem:   Upper GI bleed Active Problems:   Tobacco abuse   Acute on chronic combined systolic and diastolic CHF (congestive heart failure) (H[facility])   Vertigo   Upper GI bleeding   PLAN:   Upper GI Bleed:  I suspect he has gastritis or PUD from Aleve, but there is no evidence of brisk bleeding.  Will give IV PPI BID, give clear liquid, and follow serial H and H every 6 hours.  His lower abdominal pain is better, but I am not sure its etiology.  Will consult GI for further recommendation.  I have not ordered an abdominal pelvic CT scan and will defer to GI as his abdominal pain is better, and the exam is benign.  Vertigo:  Clinically consistent  with peripheral vertigo.  Symptomatic treatment.  He is better already.  Will give benzo PRN.  Hx of CHF:  Be careful with IVF.   DVT prophylaxis: SCD.  Code Status: FULL CODE.  Family Communication: None.  Disposition Plan: Home.  Consults called: None.  Admission status: OBS.    Jacquis Paxton MD FACP. [facility]  If 7PM-7AM, please contact night-coverage [email or web address] Password [email or web address]  04/17/2017, 6:45 AM

## 2017-04-18 ENCOUNTER — Observation Stay (HOSPITAL_COMMUNITY): Payer: Medicare Other

## 2017-04-18 DIAGNOSIS — K625 Hemorrhage of anus and rectum: Secondary | ICD-10-CM | POA: Diagnosis not present

## 2017-04-18 DIAGNOSIS — R109 Unspecified abdominal pain: Secondary | ICD-10-CM

## 2017-04-18 DIAGNOSIS — K920 Hematemesis: Secondary | ICD-10-CM

## 2017-04-18 DIAGNOSIS — K922 Gastrointestinal hemorrhage, unspecified: Secondary | ICD-10-CM | POA: Diagnosis not present

## 2017-04-18 DIAGNOSIS — R1030 Lower abdominal pain, unspecified: Secondary | ICD-10-CM | POA: Diagnosis not present

## 2017-04-18 LAB — CBC
HEMATOCRIT: 40.1 % (ref 39.0–52.0)
HEMOGLOBIN: 13.5 g/dL (ref 13.0–17.0)
MCH: 32.5 pg (ref 26.0–34.0)
MCHC: 33.7 g/dL (ref 30.0–36.0)
MCV: 96.4 fL (ref 78.0–100.0)
Platelets: 161 10*3/uL (ref 150–400)
RBC: 4.16 MIL/uL — AB (ref 4.22–5.81)
RDW: 15.3 % (ref 11.5–15.5)
WBC: 5.2 10*3/uL (ref 4.0–10.5)

## 2017-04-18 LAB — COMPREHENSIVE METABOLIC PANEL
ALBUMIN: 2.7 g/dL — AB (ref 3.5–5.0)
ALT: 15 U/L — ABNORMAL LOW (ref 17–63)
AST: 27 U/L (ref 15–41)
Alkaline Phosphatase: 103 U/L (ref 38–126)
Anion gap: 6 (ref 5–15)
BUN: 7 mg/dL (ref 6–20)
CHLORIDE: 113 mmol/L — AB (ref 101–111)
CO2: 23 mmol/L (ref 22–32)
Calcium: 8.2 mg/dL — ABNORMAL LOW (ref 8.9–10.3)
Creatinine, Ser: 0.66 mg/dL (ref 0.61–1.24)
GFR calc Af Amer: >60 mL/min (ref >60)
GFR calc non Af Amer: >60 mL/min (ref >60)
GLUCOSE: 93 mg/dL (ref 65–99)
POTASSIUM: 4.2 mmol/L (ref 3.5–5.1)
SODIUM: 142 mmol/L (ref 135–145)
Total Bilirubin: 0.6 mg/dL (ref 0.3–1.2)
Total Protein: 5.2 g/dL — ABNORMAL LOW (ref 6.5–8.1)

## 2017-04-18 LAB — HEPATITIS B SURFACE ANTIGEN: HEP B S AG: NEGATIVE

## 2017-04-18 LAB — HEPATITIS C ANTIBODY

## 2017-04-18 MED ORDER — PANTOPRAZOLE SODIUM 40 MG PO TBEC: 40.0000 mg | DELAYED_RELEASE_TABLET | Freq: Two times a day (BID) | ORAL | 2 refills | Status: DC

## 2017-04-18 MED ORDER — IOPAMIDOL (ISOVUE-300) INJECTION 61%
INTRAVENOUS | Status: AC
  Administered 2017-04-18: 30 mL
  Filled 2017-04-18: qty 30

## 2017-04-18 MED ORDER — IOPAMIDOL (ISOVUE-300) INJECTION 61%
100.0000 mL | Freq: Once | INTRAVENOUS | Status: AC | PRN
  Administered 2017-04-18: 100 mL via INTRAVENOUS

## 2017-04-18 MED ORDER — PANTOPRAZOLE SODIUM 40 MG PO TBEC
40.0000 mg | DELAYED_RELEASE_TABLET | Freq: Two times a day (BID) | ORAL | Status: DC
  Administered 2017-04-18 – 2017-04-19 (×3): 40 mg via ORAL
  Filled 2017-04-18 (×3): qty 1

## 2017-04-18 MED ORDER — MAGNESIUM OXIDE 400 (241.3 MG) MG PO TABS
800.0000 mg | ORAL_TABLET | Freq: Once | ORAL | Status: AC
  Administered 2017-04-18: 800 mg via ORAL
  Filled 2017-04-18: qty 2

## 2017-04-18 MED ORDER — MAGNESIUM OXIDE 400 (241.3 MG) MG PO TABS: 800.0000 mg | ORAL_TABLET | Freq: Every day | ORAL | 2 refills | Status: DC

## 2017-04-18 MED ORDER — NICOTINE 21 MG/24HR TD PT24: 21.0000 mg | MEDICATED_PATCH | Freq: Every day | TRANSDERMAL | 0 refills | Status: DC

## 2017-04-18 MED ORDER — PANTOPRAZOLE SODIUM 40 MG PO TBEC: 40.0000 mg | DELAYED_RELEASE_TABLET | Freq: Every day | ORAL | Status: DC

## 2017-04-18 NOTE — Progress Notes (Signed)
CT contrast given to pt. Educated about trying to drink as much as he can before the procedure. Verbalized understanding. Will continue to monitor.

## 2017-04-18 NOTE — Discharge Summary (Addendum)
Physician Discharge Summary  Shawn [name] MRN: [id number] DOB/AGE: [birth year] 66 y.o.  PCP: Raylene Everts, MD   Admit date: 04/17/2017 Discharge date: 04/18/2017 Discharge Diagnoses:  Discharge Diagnoses:    Principal Problem:   Upper GI bleed Active Problems:   Tobacco abuse   Acute on chronic combined systolic and diastolic CHF (congestive heart failure) (HCC)   Vertigo   Upper GI bleeding   Coffee ground emesis   Lower abdominal pain    Follow-up recommendations Follow-up with PCP in 3-5 days , including all  additional recommended appointments as below Follow-up CBC, CMP in 3-5 days Follow-up with GI  in 8 weeks      Current Discharge Medication List    START taking these medications   Details  magnesium oxide (MAG-OX) 400 (241.3 Mg) MG tablet Take 2 tablets (800 mg total) by mouth daily. Qty: 60 tablet, Refills: 2    nicotine (NICODERM CQ - DOSED IN MG/24 HOURS) 21 mg/24hr patch Place 1 patch (21 mg total) onto the skin daily. Qty: 28 patch, Refills: 0    pantoprazole (PROTONIX) 40 MG tablet Take 1 tablet (40 mg total) by mouth 2 (two) times daily. Qty: 60 tablet, Refills: 2      CONTINUE these medications which have NOT CHANGED   Details  acetaminophen (TYLENOL) 650 MG CR tablet Take 1,300 mg by mouth every 8 (eight) hours as needed for pain.    Artificial Tear Ointment (DRY EYES OP) Apply 1 drop to eye daily as needed (dry eyes).    aspirin EC 81 MG tablet Take 1 tablet (81 mg total) by mouth daily. Qty: 100 tablet, Refills: 3    Emollient (VASELINE INTENSIVE CARE EX) Apply 1 application topically daily as needed. For bruise on left hand and both arms.    multivitamin-iron-minerals-folic acid (THERAPEUTIC-M) TABS tablet Take 1 tablet by mouth daily.      STOP taking these medications     ibuprofen (ADVIL,MOTRIN) 600 MG tablet          Discharge Condition: *Stable   Discharge Instructions Get Medicines reviewed and adjusted: Please take all your  medications with you for your next visit with your Primary MD  Please request your Primary MD to go over all hospital tests and procedure/radiological results at the follow up, please ask your Primary MD to get all Hospital records sent to his/her office.  If you experience worsening of your admission symptoms, develop shortness of breath, life threatening emergency, suicidal or homicidal thoughts you must seek medical attention immediately by calling 911 or calling your MD immediately if symptoms less severe.  You must read complete instructions/literature along with all the possible adverse reactions/side effects for all the Medicines you take and that have been prescribed to you. Take any new Medicines after you have completely understood and accpet all the possible adverse reactions/side effects.   Do not drive when taking Pain medications.   Do not take more than prescribed Pain, Sleep and Anxiety Medications  Special Instructions: If you have smoked or chewed Tobacco in the last 2 yrs please stop smoking, stop any regular Alcohol and or any Recreational drug use.  Wear Seat belts while driving.  Please note  You were cared for by a hospitalist during your hospital stay. Once you are discharged, your primary care physician will handle any further medical issues. Please note that NO REFILLS for any discharge medications will be authorized once you are discharged, as it is imperative that you return to your  primary care physician (or establish a relationship with a primary care physician if you do not have one) for your aftercare needs so that they can reassess your need for medications and monitor your lab values.     Allergies  Allergen Reactions  . Depakote [Divalproex Sodium] Other (See Comments)    "Makes me crazy"  . Codeine Nausea Only and Other (See Comments)    flushing      Disposition: 01-Home or Self Care   Consults: GI*    Significant Diagnostic  Studies:  Ct Head Wo Contrast  Result Date: 04/17/2017 CLINICAL DATA:  Feeling dizzy since fall yesterday. EXAM: CT HEAD WITHOUT CONTRAST TECHNIQUE: Contiguous axial images were obtained from the base of the skull through the vertex without intravenous contrast. COMPARISON:  CT HEAD Jan 08, 2017 FINDINGS: BRAIN: No intraparenchymal hemorrhage, mass effect nor midline shift. Moderate to severe ventriculomegaly on the basis of global parenchymal brain volume loss as there is commensurate enlargement of cerebral sulci and cerebellar folia. Patchy to confluent supratentorial white matter hypodensities. No acute large vascular territory infarcts. No abnormal extra-axial fluid collections. Basal cisterns are patent. Extensive dural calcifications. VASCULAR: Moderate calcific atherosclerosis of the carotid siphons. SKULL: No skull fracture. No significant scalp soft tissue swelling. SINUSES/ORBITS: The mastoid air-cells and included paranasal sinuses are well-aerated.The included ocular globes and orbital contents are non-suspicious. OTHER: None. IMPRESSION: 1. No acute intracranial process. 2. Stable examination including moderate to severe atrophy and moderate chronic small vessel ischemic disease. Electronically Signed   By: Elon Alas M.D.   On: 04/17/2017 03:01        Filed Weights   04/17/17 0041 04/17/17 0916  Weight: 81.2 kg (179 lb) 71.4 kg (157 lb 8 oz)     Microbiology: No results found for this or any previous visit (from the past 240 hour(s)).     Blood Culture    Component Value Date/Time   SDES TRACHEAL ASPIRATE 11/23/2015 1155   SPECREQUEST Normal 11/23/2015 1155   CULT  11/23/2015 1155    FEW KLEBSIELLA PNEUMONIAE Performed at [facility] 11/25/2015 FINAL 11/23/2015 1155      Labs: Results for orders placed or performed during the hospital encounter of 04/17/17 (from the past 48 hour(s))  Occult blood, poc device     Status: Abnormal    Collection Time: 04/17/17  1:34 AM  Result Value Ref Range   Fecal Occult Bld POSITIVE (A) NEGATIVE  Comprehensive metabolic panel     Status: Abnormal   Collection Time: 04/17/17  2:30 AM  Result Value Ref Range   Sodium 142 135 - 145 mmol/L   Potassium 3.3 (L) 3.5 - 5.1 mmol/L   Chloride 111 101 - 111 mmol/L   CO2 23 22 - 32 mmol/L   Glucose, Bld 89 65 - 99 mg/dL   BUN 10 6 - 20 mg/dL   Creatinine, Ser 0.69 0.61 - 1.24 mg/dL   Calcium 8.2 (L) 8.9 - 10.3 mg/dL   Total Protein 5.6 (L) 6.5 - 8.1 g/dL   Albumin 2.9 (L) 3.5 - 5.0 g/dL   AST 31 15 - 41 U/L   ALT 16 (L) 17 - 63 U/L   Alkaline Phosphatase 102 38 - 126 U/L   Total Bilirubin 0.9 0.3 - 1.2 mg/dL   GFR calc non Af Amer >60 >60 mL/min   GFR calc Af Amer >60 >60 mL/min    Comment: (NOTE) The eGFR has been calculated using the CKD  EPI equation. This calculation has not been validated in all clinical situations. eGFR's persistently <60 mL/min signify possible Chronic Kidney Disease.    Anion gap 8 5 - 15  CBC with Differential     Status: Abnormal   Collection Time: 04/17/17  2:30 AM  Result Value Ref Range   WBC 4.1 4.0 - 10.5 K/uL   RBC 4.11 (L) 4.22 - 5.81 MIL/uL   Hemoglobin 13.3 13.0 - 17.0 g/dL   HCT 39.3 39.0 - 52.0 %   MCV 95.6 78.0 - 100.0 fL   MCH 32.4 26.0 - 34.0 pg   MCHC 33.8 30.0 - 36.0 g/dL   RDW 15.1 11.5 - 15.5 %   Platelets 163 150 - 400 K/uL   Neutrophils Relative % 75 %   Neutro Abs 3.1 1.7 - 7.7 K/uL   Lymphocytes Relative 16 %   Lymphs Abs 0.6 (L) 0.7 - 4.0 K/uL   Monocytes Relative 7 %   Monocytes Absolute 0.3 0.1 - 1.0 K/uL   Eosinophils Relative 1 %   Eosinophils Absolute 0.1 0.0 - 0.7 K/uL   Basophils Relative 1 %   Basophils Absolute 0.0 0.0 - 0.1 K/uL  Ethanol     Status: None   Collection Time: 04/17/17  2:30 AM  Result Value Ref Range   Alcohol, Ethyl (B) <5 <5 mg/dL    Comment:        LOWEST DETECTABLE LIMIT FOR SERUM ALCOHOL IS 5 mg/dL FOR MEDICAL PURPOSES ONLY   POC  occult blood, ED RN will collect     Status: Abnormal   Collection Time: 04/17/17  6:45 AM  Result Value Ref Range   Fecal Occult Bld POSITIVE (A) NEGATIVE  CBC     Status: None   Collection Time: 04/17/17 10:04 AM  Result Value Ref Range   WBC 4.2 4.0 - 10.5 K/uL   RBC 4.30 4.22 - 5.81 MIL/uL   Hemoglobin 13.9 13.0 - 17.0 g/dL   HCT 41.3 39.0 - 52.0 %   MCV 96.0 78.0 - 100.0 fL   MCH 32.3 26.0 - 34.0 pg   MCHC 33.7 30.0 - 36.0 g/dL   RDW 15.2 11.5 - 15.5 %   Platelets 175 150 - 400 K/uL  CBC     Status: Abnormal   Collection Time: 04/17/17 12:21 PM  Result Value Ref Range   WBC 4.5 4.0 - 10.5 K/uL   RBC 4.05 (L) 4.22 - 5.81 MIL/uL   Hemoglobin 13.2 13.0 - 17.0 g/dL   HCT 38.5 (L) 39.0 - 52.0 %   MCV 95.1 78.0 - 100.0 fL   MCH 32.6 26.0 - 34.0 pg   MCHC 34.3 30.0 - 36.0 g/dL   RDW 15.2 11.5 - 15.5 %   Platelets 162 150 - 400 K/uL  Hepatitis B surface antigen     Status: None   Collection Time: 04/17/17 12:21 PM  Result Value Ref Range   Hepatitis B Surface Ag Negative Negative    Comment: (NOTE) Performed At: [facility] [place], [place] [id number] Lindon Romp MD [id number]   Hepatitis C antibody     Status: None   Collection Time: 04/17/17 12:21 PM  Result Value Ref Range   HCV Ab <0.1 0.0 - 0.9 s/co ratio    Comment: (NOTE)  Negative:     < 0.8                             Indeterminate: 0.8 - 0.9                                  Positive:     > 0.9 The CDC recommends that a positive HCV antibody result be followed up with a HCV Nucleic Acid Amplification test (220254). Performed At: [facility] [place], [place] [id number] Lindon Romp MD [id number]   CBC     Status: Abnormal   Collection Time: 04/17/17  8:49 PM  Result Value Ref Range   WBC 3.9 (L) 4.0 - 10.5 K/uL   RBC 4.03 (L) 4.22 - 5.81 MIL/uL   Hemoglobin 13.1 13.0 - 17.0 g/dL   HCT 38.5 (L) 39.0 - 52.0 %    MCV 95.5 78.0 - 100.0 fL   MCH 32.5 26.0 - 34.0 pg   MCHC 34.0 30.0 - 36.0 g/dL   RDW 15.3 11.5 - 15.5 %   Platelets 157 150 - 400 K/uL  CBC     Status: Abnormal   Collection Time: 04/18/17  2:47 AM  Result Value Ref Range   WBC 5.2 4.0 - 10.5 K/uL   RBC 4.16 (L) 4.22 - 5.81 MIL/uL   Hemoglobin 13.5 13.0 - 17.0 g/dL   HCT 40.1 39.0 - 52.0 %   MCV 96.4 78.0 - 100.0 fL   MCH 32.5 26.0 - 34.0 pg   MCHC 33.7 30.0 - 36.0 g/dL   RDW 15.3 11.5 - 15.5 %   Platelets 161 150 - 400 K/uL  Comprehensive metabolic panel     Status: Abnormal   Collection Time: 04/18/17  2:47 AM  Result Value Ref Range   Sodium 142 135 - 145 mmol/L   Potassium 4.2 3.5 - 5.1 mmol/L    Comment: DELTA CHECK NOTED   Chloride 113 (H) 101 - 111 mmol/L   CO2 23 22 - 32 mmol/L   Glucose, Bld 93 65 - 99 mg/dL   BUN 7 6 - 20 mg/dL   Creatinine, Ser 0.66 0.61 - 1.24 mg/dL   Calcium 8.2 (L) 8.9 - 10.3 mg/dL   Total Protein 5.2 (L) 6.5 - 8.1 g/dL   Albumin 2.7 (L) 3.5 - 5.0 g/dL   AST 27 15 - 41 U/L   ALT 15 (L) 17 - 63 U/L   Alkaline Phosphatase 103 38 - 126 U/L   Total Bilirubin 0.6 0.3 - 1.2 mg/dL   GFR calc non Af Amer >60 >60 mL/min   GFR calc Af Amer >60 >60 mL/min    Comment: (NOTE) The eGFR has been calculated using the CKD EPI equation. This calculation has not been validated in all clinical situations. eGFR's persistently <60 mL/min signify possible Chronic Kidney Disease.    Anion gap 6 5 - 15     Lipid Panel     Component Value Date/Time   CHOL 182 02/21/2017 1112   TRIG 190 (H) 02/21/2017 1112   HDL 45 02/21/2017 1112   CHOLHDL 4.0 02/21/2017 1112   VLDL 38 (H) 02/21/2017 1112   LDLCALC 99 02/21/2017 1112     Lab Results  Component Value Date   HGBA1C 5.1 02/21/2017     Lab Results  Component Value Date   LDLCALC  99 02/21/2017   CREATININE 0.66 04/18/2017     HPI :   66 y.o. 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  He also has vertigo, and with it, nausea.  He vomited coffee ground emesis, but no blood.  He has been taking aleve for his abdominal pain.  He had no diarrhea, black or bloody stool, and had no fever.  Evaluation in the ER included a negative head CT.  Stable hemodynamics, and stool was OB positive. His Hb was 13 g per dL, normal BUN and normal Cr. Chronically he has diarrhea. 3-4 loose stools daily. Describes nocturnal diarrhea.Denies recent alcohol use, IV or intranasal drug use. Remote intranasal cocaine use  HOSPITAL COURSE  Coffee-ground emesis Status post EGD 8/16 that showed reflux esophagitis, Schatzki's ring, small hiatal hernia GI recommended to continue Protonix 40 mg twice a [name], Hemoglobin stable at 13.5 prior to discharge GI recommends discharge at this time CT abdomen pelvis showed.Marland KitchenMarland KitchenAortic atherosclerosis. Sigmoid diverticulosis without inflammation.   Hypokalemia repleted  History of cardiomyopathy-appears compensated, previous EF of 40-45%,  History of paroxysmal atrial fibrillation, not on anticoagulation currently, rate is controlled  Previous history of alcohol dependence, claims he has not had a drink in a while  Nicotine dependence-nicotine patch provided  Fall, unwitnessed - CT head negative for bleed  Discharge Exam:  Blood pressure 102/65, pulse (!) 55, temperature 98.2 F (36.8 C), temperature source Oral, resp. rate 18, height 6' (1.829 m), weight 71.4 kg (157 lb 8 oz), SpO2 98 %. Heart: Regular rate and rhythm, no murmurs rubs or gallops.  Abdomen: Bowel sounds are normal, nontender, nondistended, no hepatosplenomegaly or masses, no abdominal bruits or    hernia , no rebound or guarding.  Well-healed midline incision lower abdomen. Tenderness over incision. Rectal:  Not performed.  Extremities: No lower extremity edema, clubbing, deformity.  Neuro: Alert and oriented x 4 , grossly normal neurologically.  Skin: Warm and dry, no rash or jaundice.         SignedReyne Dumas 04/18/2017, 11:14 AM        Time spent >1 hour

## 2017-04-18 NOTE — Progress Notes (Signed)
Pt was found in bed around 1410 by Robin, NT. Called RN to the room, IV was pulled out as blood was all over the floor. Asked pt was happened and stated that "he fell." States he was going to the bathroom and slipped on some water, then picked himself back up to get in bed. Five minutes later, pt stated he doesn't remember how he fell just that he remembers he got up and saw blood everywhere. Vitals obtained, WNL. Neuro WNL. Pt did develop a small hematoma to back of head. Ice applied. MD made aware at 1416. Order for STAT CT scan since pt did hit his head. MD aware there is no IV access. Will continue to monitor. Fall paperwork and flowsheet filled out.

## 2017-04-18 NOTE — Care Management Obs Status (Signed)
[facility] NOTIFICATION   Patient Details  Name: Shawn [name] MRN: [id number] Date of Birth: Jun 06, 1951   Medicare Observation Status Notification Given:  Yes    Jerusalen Mateja, Chauncey Reading, RN 04/18/2017, 11:43 AM

## 2017-04-18 NOTE — Evaluation (Signed)
Physical Therapy Evaluation Patient Details Name: Shawn [name] MRN: [id number] DOB: Jan 30, 1951 Today's Date: 04/18/2017   History of Present Illness  Shawn [name] is an 66 y.o. male with hx of previous alcohol and substance abuse, PAF ,  Depression, , CHF, bipolar disorder, B12 deficiency, lives at home with his son, Admitted 04/17/17 for  lower abdominal pain ,has vertigo, and with it, nausea. S/P endoscopy  04/17/17 for GI bleed.   Clinical Impression  The patient just had  Episode of not making  To bathroom with nursing a for BM. Patient is disheveled, hands  With BM in nails and shoes in room very soiled. The patient states that he does not want to return home with son., has no other family. The patient complained of dizziness. Orthostatics placed in doc flow sheets  With noted drop in  BP. He did ambulated  x 28' with RW  With less  complaints of dizziness. Pt admitted with above diagnosis. Pt currently with functional limitations due to the deficits listed below (see PT Problem List). Pt will benefit from skilled PT to increase their independence and safety with mobility to allow discharge to the venue listed below.    T    Follow Up Recommendations SNF;(pt under obs status)Home health PT;Supervision/Assistance - 24 hour    Equipment Recommendations  None recommended by PT    Recommendations for Other Services       Precautions / Restrictions Precautions Precautions: Fall Precaution Comments: had a fall at bedside 8/17, urgent to urinate, diarrhea Restrictions Weight Bearing Restrictions: No      Mobility  Bed Mobility Overal bed mobility: Independent                Transfers Overall transfer level: Needs assistance Equipment used: Rolling walker (2 wheeled);1 person hand held assist Transfers: Sit to/from Stand Sit to Stand: Min assist         General transfer comment: steady assist to stand  Ambulation/Gait Ambulation/Gait assistance: Mod assist;Min  assist   Assistive device: Rolling walker (2 wheeled) Gait Pattern/deviations: Step-through pattern;Staggering right;Staggering left     General Gait Details: on first attempt to ambulate, went 6 feet and stated  that he was dizzy. assisted back to bed. ambulated after Orthostatic BP takes. ambulated x 60.   Stairs            Wheelchair Mobility    Modified Rankin (Stroke Patients Only)       Balance Overall balance assessment: History of Falls;Needs assistance Sitting-balance support: No upper extremity supported Sitting balance-Leahy Scale: Good     Standing balance support: During functional activity;No upper extremity supported Standing balance-Leahy Scale: Poor                               Pertinent Vitals/Pain Pain Assessment: No/denies pain    Home Living Family/patient expects to be discharged to:: Private residence Living Arrangements: Children Available Help at Discharge: Family;Available 24 hours/day Type of Home: House Home Access: Ramped entrance     Home Layout: One level Home Equipment: Walker - 2 wheels;Cane - single point Additional Comments: lives with son whom  patient reports will not be much assistance    Prior Function Level of Independence: Independent with assistive device(s)               Hand Dominance        Extremity/Trunk Assessment   Upper Extremity Assessment Upper Extremity  Assessment: Generalized weakness    Lower Extremity Assessment Lower Extremity Assessment: Generalized weakness    Cervical / Trunk Assessment Cervical / Trunk Assessment: Kyphotic  Communication   Communication: No difficulties  Cognition Arousal/Alertness: Awake/alert Behavior During Therapy: Flat affect Overall Cognitive Status: Impaired/Different from baseline Area of Impairment: Orientation                 Orientation Level: Place;Time;Situation             General Comments: asked what town we are in       General Comments      Exercises     Assessment/Plan    PT Assessment Patient needs continued PT services  PT Problem List Decreased activity tolerance;Decreased balance;Decreased mobility;Decreased cognition;Decreased knowledge of use of DME;Decreased safety awareness;Decreased knowledge of precautions       PT Treatment Interventions DME instruction;Gait training;Functional mobility training;Therapeutic activities    PT Goals (Current goals can be found in the Care Plan section)  Acute Rehab PT Goals Patient Stated Goal: I want to go stay some where . PT Goal Formulation: With patient Time For Goal Achievement: 05/02/17 Potential to Achieve Goals: Fair    Frequency Min 2X/week   Barriers to discharge Decreased caregiver support patient reports that he is afraid to return home with son.    Co-evaluation               AM-PAC PT "6 Clicks" Daily Activity  Outcome Measure Difficulty turning over in bed (including adjusting bedclothes, sheets and blankets)?: None Difficulty moving from lying on back to sitting on the side of the bed? : None Difficulty sitting down on and standing up from a chair with arms (e.g., wheelchair, bedside commode, etc,.)?: A Lot Help needed moving to and from a bed to chair (including a wheelchair)?: A Lot Help needed walking in hospital room?: A Lot Help needed climbing 3-5 steps with a railing? : A Lot 6 Click Score: 16    End of Session Equipment Utilized During Treatment: Gait belt Activity Tolerance: Patient limited by fatigue Patient left: in bed;with call bell/phone within reach;with bed alarm set Nurse Communication: Mobility status PT Visit Diagnosis: Difficulty in walking, not elsewhere classified (R26.2);History of falling (Z91.81)    Time: 1630-1700 PT Time Calculation (min) (ACUTE ONLY): 30 min   Charges:   PT Evaluation $PT Eval Low Complexity: 1 Low PT Treatments $Gait Training: 8-22 mins   PT G Codes:   PT  G-Codes **NOT FOR INPATIENT CLASS** Functional Assessment Tool Used: Clinical judgement Functional Limitation: Mobility: Walking and moving around Mobility: Walking and Moving Around Current Status (I7782): At least 20 percent but less than 40 percent impaired, limited or restricted Mobility: Walking and Moving Around Goal Status [phone]): 0 percent impaired, limited or restricted   [facility] PT [phone]   Claretha Cooper 04/18/2017, 5:26 PM

## 2017-04-18 NOTE — Progress Notes (Signed)
Subjective: Doing well, frustrated at needing to be cared for. Denies abdominal pain, N/V. Tolerating diet well. Denies hematemesis, hematochezia, melena. No other upper or lower GI symptoms at this time. Looking forward to discharge home.  Objective: Vital signs in last 24 hours: Temp:  [97.8 F (36.6 C)-98.5 F (36.9 C)] 98.2 F (36.8 C) (08/17 0424) Pulse Rate:  [44-56] 55 (08/17 0424) Resp:  [15-31] 18 (08/17 0424) BP: (73-123)/(50-66) 102/65 (08/17 0424) SpO2:  [94 %-99 %] 98 % (08/17 0424)   General:   Alert and oriented, pleasant Head:  Normocephalic and atraumatic. Eyes:  No icterus, sclera clear. Conjuctiva pink.   Heart:  S1, S2 present, no murmurs noted.  Lungs: Clear to auscultation bilaterally, without wheezing, rales, or rhonchi.  Abdomen:  Bowel sounds present, soft, non-tender, non-distended. No HSM or hernias noted. No rebound or guarding. Msk:  Symmetrical without gross deformities. Pulses:  Normal bilateral DP pulses noted. Extremities:  Without clubbing or edema. Neurologic:  Alert and  oriented x4;  grossly normal neurologically. Psych:  Alert and cooperative. Normal mood and affect.  Intake/Output from previous day: 08/16 0701 - 08/17 0700 In: 2913.8 [I.V.:2913.8] Out: 400 [Urine:400] Intake/Output this shift: Total I/O In: 600 [P.O.:600] Out: 825 [Urine:825]  Lab Results:  Recent Labs  04/17/17 1221 04/17/17 2049 04/18/17 0247  WBC 4.5 3.9* 5.2  HGB 13.2 13.1 13.5  HCT 38.5* 38.5* 40.1  PLT 162 157 161   BMET  Recent Labs  04/17/17 0230 04/18/17 0247  NA 142 142  K 3.3* 4.2  CL 111 113*  CO2 23 23  GLUCOSE 89 93  BUN 10 7  CREATININE 0.69 0.66  CALCIUM 8.2* 8.2*   LFT  Recent Labs  04/17/17 0230 04/18/17 0247  PROT 5.6* 5.2*  ALBUMIN 2.9* 2.7*  AST 31 27  ALT 16* 15*  ALKPHOS 102 103  BILITOT 0.9 0.6   PT/INR No results for input(s): LABPROT, INR in the last 72 hours. Hepatitis Panel  Recent Labs   04/17/17 1221  HEPBSAG Negative  HCVAB <0.1     Studies/Results: Ct Head Wo Contrast  Result Date: 04/17/2017 CLINICAL DATA:  Feeling dizzy since fall yesterday. EXAM: CT HEAD WITHOUT CONTRAST TECHNIQUE: Contiguous axial images were obtained from the base of the skull through the vertex without intravenous contrast. COMPARISON:  CT HEAD Jan 08, 2017 FINDINGS: BRAIN: No intraparenchymal hemorrhage, mass effect nor midline shift. Moderate to severe ventriculomegaly on the basis of global parenchymal brain volume loss as there is commensurate enlargement of cerebral sulci and cerebellar folia. Patchy to confluent supratentorial white matter hypodensities. No acute large vascular territory infarcts. No abnormal extra-axial fluid collections. Basal cisterns are patent. Extensive dural calcifications. VASCULAR: Moderate calcific atherosclerosis of the carotid siphons. SKULL: No skull fracture. No significant scalp soft tissue swelling. SINUSES/ORBITS: The mastoid air-cells and included paranasal sinuses are well-aerated.The included ocular globes and orbital contents are non-suspicious. OTHER: None. IMPRESSION: 1. No acute intracranial process. 2. Stable examination including moderate to severe atrophy and moderate chronic small vessel ischemic disease. Electronically Signed   By: Elon Alas M.D.   On: 04/17/2017 03:01    Assessment: 66 year old gentleman presenting with dizziness, fall, reported lower abdominal pain and coffee-ground emesis. Heme positive stool. Hemoglobin is stable. He reports aspirin powder use up until a month ago. He uses Aleve once daily. Chronically has diarrhea, possibly due to small bowel resections. Complained yesterday of solid food dysphagia.   It is unclear if his lower abdominal  pain is related to his fall or chronically from his previous surgeries. Due to upper GI bleed in the setting of NSAIDs/aspirin use her was arranged for EGD yesterday. Hemoglobin has been  normal. There is no objective indication of chronic advanced liver disease, no thrombocytopenia and recent normal INR.  EGD yesterday found esophagitis including circumferential distal esophageal erosions and nonobstructing Schatzki's ring not manipulated. No sign of Barrett's esophagus. Small hiatal hernia. Otherwise normal exam. Recommended twice a day PPI therapy for minimum 3 months and could likely be discharged in the next 24 hours from a GI standpoint.  He is doing well from a GI perspective today, tolerating diet well without overt GI symptoms. Will eventually need a colonoscopy as an outpatient. Hgb remains normal today.   Plan: 1. Continue bid PPI at discharge for 3 months 2. Follow-up in our office in 8 weeks 3. Will need outpatient colonoscopy 4. Call if any questions/concerns before follow-up 5. Avoid all NSAIDs and ASA powders   Thank you for allowing [place] to participate in the care of Shawn Kanner, DNP, AGNP-C Adult & Gerontological Nurse Practitioner [facility]     LOS: 0 days    04/18/2017, 12:11 PM

## 2017-04-18 NOTE — Care Management Note (Signed)
Case Management Note  Patient Details  Name: Shawn [name] MRN: [id number] Date of Birth: [birth year]  Subjective/Objective:   Adm with upper GI bleed. From home with son. Ind with ADL's, walks with cane. Has PCP, reports that friends drive him to appointments or uses [facility] to get to appointments.   CT of ABD pending.          Action/Plan: Anticipate DC home with self care. No CM needs communicated.    Expected Discharge Date:       04/18/2017          Expected Discharge Plan:  Home/Self Care  In-House Referral:     Discharge planning Services  CM Consult  Post Acute Care Choice:  NA Choice offered to:  NA  DME Arranged:    DME Agency:     HH Arranged:    HH Agency:     Status of Service:  Completed, signed off  If discussed at [employer] of Stay Meetings, dates discussed:    Additional Comments:  Shawn [name], Shawn Reading, RN 04/18/2017, 12:35 PM

## 2017-04-19 DIAGNOSIS — K922 Gastrointestinal hemorrhage, unspecified: Secondary | ICD-10-CM | POA: Diagnosis not present

## 2017-04-19 MED ORDER — ONDANSETRON HCL 4 MG PO TABS
4.0000 mg | ORAL_TABLET | Freq: Four times a day (QID) | ORAL | 0 refills | Status: DC | PRN
Start: 2017-04-19 — End: 2017-06-20

## 2017-04-19 NOTE — Progress Notes (Signed)
Seen and evaluated on rounds. Discharge summary from yesterday reviewed and noted. Just had held yesterday due to accidental fall . Abdominal CT reviewed and noted-negative. Denies any fever or chills. No abdominal pain this morning. No dizziness. Patient remains hemodynamically stable Head CT negative 04/18/2017 CBC from yesterday unremarkable. 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 He also has vertigo, and with it, nausea. He vomited coffee ground emesis, but no blood. He has been taking aleve for his abdominal pain. He had no diarrhea, black or bloody stool, and had no fever. Evaluation in the ER included a negative head CT. Stable hemodynamics, and stool was OB positive. His Hb was 13 g per dL, normal BUN and normal Cr. Chronically he has diarrhea. 3-4 loose stools daily.  HOSPITAL COURSE  Coffee-ground emesis Status post EGD 8/16 that showed reflux esophagitis, Schatzki's ring, small hiatal hernia GI recommends Protonix 40 mg twice a day, Hemoglobin stable- 13.5  GI recommends discharge at this time CT abdomen pelvis showed.Marland KitchenMarland KitchenAortic atherosclerosis. Sigmoid diverticulosis without inflammation.   Hypokalemia repleted  History of cardiomyopathy-appears compensated, previous EF of 40-45%,  History of paroxysmal atrial fibrillation, not on anticoagulation currently, rate is controlled  Previous history of alcohol dependence, claims he has not had a drink in a while  Nicotine dependence-nicotine patch provided  Fall, unwitnessed - CT head negative for bleed  Discharge Exam:  Vital signs stable and noted. Heart: Regular rate and rhythm, no murmurs rubs or gallops.  Abdomen: Bowel  sounds are normal, nontender, nondistended, no hepatosplenomegaly or masses, no abdominal bruits or hernia , no rebound or guarding. Well-healed midline incision lower abdomen.   Extremities: No lower extremity edema, clubbing, deformity.  Neuro: Alert and oriented x 4 , grossly normal neurologically.  Skin: Warm and dry, no rash or jaundice.

## 2017-04-28 ENCOUNTER — Encounter (HOSPITAL_COMMUNITY): Payer: Self-pay | Admitting: Internal Medicine

## 2017-06-05 ENCOUNTER — Encounter: Payer: Self-pay | Admitting: Internal Medicine

## 2017-06-06 ENCOUNTER — Telehealth: Payer: Self-pay

## 2017-06-06 NOTE — Telephone Encounter (Signed)
appt made

## 2017-06-18 ENCOUNTER — Encounter (HOSPITAL_COMMUNITY): Payer: Self-pay

## 2017-06-18 ENCOUNTER — Emergency Department (HOSPITAL_COMMUNITY): Payer: Medicare Other

## 2017-06-18 ENCOUNTER — Emergency Department (HOSPITAL_COMMUNITY)
Admission: EM | Admit: 2017-06-18 | Discharge: 2017-06-18 | Disposition: A | Discharge status: Home / Self Care | Payer: Medicare Other | Attending: Emergency Medicine | Admitting: Emergency Medicine

## 2017-06-18 DIAGNOSIS — Z7982 Long term (current) use of aspirin: Secondary | ICD-10-CM | POA: Diagnosis not present

## 2017-06-18 DIAGNOSIS — D72819 Decreased white blood cell count, unspecified: Secondary | ICD-10-CM | POA: Diagnosis not present

## 2017-06-18 DIAGNOSIS — R531 Weakness: Secondary | ICD-10-CM | POA: Diagnosis not present

## 2017-06-18 DIAGNOSIS — Z79899 Other long term (current) drug therapy: Secondary | ICD-10-CM | POA: Diagnosis not present

## 2017-06-18 DIAGNOSIS — E876 Hypokalemia: Secondary | ICD-10-CM | POA: Diagnosis not present

## 2017-06-18 DIAGNOSIS — F17210 Nicotine dependence, cigarettes, uncomplicated: Secondary | ICD-10-CM | POA: Insufficient documentation

## 2017-06-18 DIAGNOSIS — N189 Chronic kidney disease, unspecified: Secondary | ICD-10-CM | POA: Diagnosis not present

## 2017-06-18 DIAGNOSIS — I5042 Chronic combined systolic (congestive) and diastolic (congestive) heart failure: Secondary | ICD-10-CM | POA: Diagnosis not present

## 2017-06-18 DIAGNOSIS — R42 Dizziness and giddiness: Secondary | ICD-10-CM | POA: Insufficient documentation

## 2017-06-18 DIAGNOSIS — R5383 Other fatigue: Secondary | ICD-10-CM | POA: Diagnosis not present

## 2017-06-18 DIAGNOSIS — R110 Nausea: Secondary | ICD-10-CM | POA: Diagnosis not present

## 2017-06-18 LAB — BASIC METABOLIC PANEL
ANION GAP: 8 (ref 5–15)
BUN: 9 mg/dL (ref 6–20)
CHLORIDE: 108 mmol/L (ref 101–111)
CO2: 28 mmol/L (ref 22–32)
Calcium: 8.7 mg/dL — ABNORMAL LOW (ref 8.9–10.3)
Creatinine, Ser: 0.96 mg/dL (ref 0.61–1.24)
GFR calc Af Amer: >60 mL/min (ref >60)
GLUCOSE: 110 mg/dL — AB (ref 65–99)
POTASSIUM: 3.1 mmol/L — AB (ref 3.5–5.1)
Sodium: 144 mmol/L (ref 135–145)

## 2017-06-18 LAB — CBC WITH DIFFERENTIAL/PLATELET
BASOS ABS: 0 10*3/uL (ref 0.0–0.1)
Basophils Relative: 1 %
EOS PCT: 3 %
Eosinophils Absolute: 0.1 10*3/uL (ref 0.0–0.7)
HCT: 42.4 % (ref 39.0–52.0)
HEMOGLOBIN: 14.4 g/dL (ref 13.0–17.0)
LYMPHS ABS: 0.7 10*3/uL (ref 0.7–4.0)
LYMPHS PCT: 20 %
MCH: 31.6 pg (ref 26.0–34.0)
MCHC: 34 g/dL (ref 30.0–36.0)
MCV: 93 fL (ref 78.0–100.0)
Monocytes Absolute: 0.3 10*3/uL (ref 0.1–1.0)
Monocytes Relative: 10 %
NEUTROS ABS: 2.4 10*3/uL (ref 1.7–7.7)
NEUTROS PCT: 68 %
PLATELETS: 191 10*3/uL (ref 150–400)
RBC: 4.56 MIL/uL (ref 4.22–5.81)
RDW: 13.8 % (ref 11.5–15.5)
WBC: 3.6 10*3/uL — AB (ref 4.0–10.5)

## 2017-06-18 LAB — I-STAT TROPONIN, ED: TROPONIN I, POC: 0.02 ng/mL (ref 0.00–0.08)

## 2017-06-18 MED ORDER — POTASSIUM CHLORIDE CRYS ER 20 MEQ PO TBCR: 20.0000 meq | EXTENDED_RELEASE_TABLET | Freq: Once | ORAL | 0 refills | Status: DC

## 2017-06-18 MED ORDER — SODIUM CHLORIDE 0.9 % IV BOLUS (SEPSIS)
500.0000 mL | Freq: Once | INTRAVENOUS | Status: AC
  Administered 2017-06-18: 500 mL via INTRAVENOUS

## 2017-06-18 MED ORDER — MECLIZINE HCL 12.5 MG PO TABS: 12.5000 mg | ORAL_TABLET | Freq: Three times a day (TID) | ORAL | 0 refills | Status: DC | PRN

## 2017-06-18 MED ORDER — MECLIZINE HCL 12.5 MG PO TABS
12.5000 mg | ORAL_TABLET | Freq: Once | ORAL | Status: AC
  Administered 2017-06-18: 12.5 mg via ORAL
  Filled 2017-06-18: qty 1

## 2017-06-18 NOTE — ED Provider Notes (Signed)
[facility] EMERGENCY DEPARTMENT Provider Note   CSN: [id number] Arrival date & time: 06/18/17  0847     History   Chief Complaint Chief Complaint  Patient presents with  . Dizziness    HPI MUNIR VICTORIAN is a 66 y.o. male.  HPI  66 y.o. Male states ho vertigo diagnosed within last few months, worsened today with difficulty walking due to balance.  Denies nausea, lateralized weakness.  Worse with head movement, walking.  Denies fall or head injury.  States he had some symptoms when he got up, but became worse with ambulation, stopped and had assitance from someone in office. EMS called and transported by EMS.  Denies pain, no chest pain, no sob, no abdominal pain, states some pressure in lower abdomen where previous hernia repair, but denies bulging.    Past Medical History:  Diagnosis Date  . Alcoholism ([place])    "Dry" since 2002; relapse 2014  . Allergy   . Anemia   . Anxiety   . Anxiety and depression   . Bipolar disorder [facility])    Has had multiple psych hospitalization in the past, most recently at [place] [facility] 01/31/11-02/04/11.  Says meds made him emotionally blunted. (lamictal and wellbutrin).  . Cancer ([place])    skin  . CHF (congestive heart failure) ([place])   . Chronic kidney disease   . Depression   . Diverticular disease    "itis" x 2 episodes (last was about 2007)  . Fracture of metatarsal bone(s), closed    3rd, 4th, 5th mid shaft on left foot  . History of septic shock 11/2015   ? due to small bowel ischemia: hospitalized + surgeries 11/2015  . History of substance abuse    cocaine, marijuana, acid, alcohol (in remission since 2010)  . Multiple rib fractures 10/09/10   s/p fall down a ravine--9th and 10th on right, contusion of left 7th and 8th ribs  . Osteoarthritis X years   left knee.  Distant hx (age 62) "dislocated" knee and required surgery, then crush injury to patella required knee cap removal.  . Osteoporosis   . Solar dermatitis    face and  ears  . Substance abuse ([place])   . Tobacco dependence   . Vitamin B12 deficiency 10/2013   IF ab positive.  Replacement therapy started end of March 2015    Patient Active Problem List   Diagnosis Date Noted  . Rectal bleeding   . Upper GI bleed 04/17/2017  . Vertigo 04/17/2017  . Upper GI bleeding 04/17/2017  . Coffee ground emesis   . Abdominal pain   . RBBB 02/21/2017  . Osteopenia determined by x-Durk Carmen 02/21/2017  . Vitamin B 12 deficiency 02/21/2017  . History of smoking 30 or more pack years 02/21/2017  . Vitamin D deficiency 02/21/2017  . Debilitated 11/29/2015  . Paroxysmal atrial fibrillation ([facility])   . Acute on chronic combined systolic and diastolic CHF (congestive heart failure) ([place])   . Cardiomyopathy, ischemic 11/19/2015  . Cognitive dysfunction, alcohol-related ([place]) 01/03/2014  . Periodontal disease 12/24/2013  . Alcohol dependence ([place]) 04/12/2013  . Bipolar I disorder, most recent episode depressed ([place]) 04/12/2013  . H/O: substance abuse 08/06/2012  . Tobacco abuse 08/06/2012  . History of hernia repair 08/06/2012  . Prostate cancer screening 09/19/2011  . History of depression 08/06/2011    Past Surgical History:  Procedure Laterality Date  . BOWEL RESECTION  06/15/2012   Procedure: SMALL BOWEL RESECTION;  Surgeon: Edward Jolly, MD;  Location: [facility];  Service: General;  Laterality: N/A;  . BOWEL RESECTION  11/19/2015   Procedure: SMALL BOWEL RESECTION;  Surgeon: Ralene Ok, MD;  Location: [place];  Service: General;;  . CARDIAC CATHETERIZATION N/A 11/20/2015   Procedure: Temporary Wire;  Surgeon: Evans Lance, MD;  Location: [place] [facility];  Service: Cardiovascular;  Laterality: N/A;  . ESOPHAGOGASTRODUODENOSCOPY (EGD) WITH PROPOFOL N/A 04/17/2017   Procedure: ESOPHAGOGASTRODUODENOSCOPY (EGD) WITH PROPOFOL;  Surgeon: Daneil Dolin, MD;  [facility];  Service: Endoscopy;  Laterality: N/A;  . FEMUR IM NAIL  08/06/2012   Procedure:  INTRAMEDULLARY (IM) NAIL FEMORAL;  Surgeon: Johnn Hai, MD;  Location: [facility];  Service: Orthopedics;  Laterality: Left;  . HERNIA REPAIR     at 66 years of age  33  . INGUINAL HERNIA REPAIR     left  . INGUINAL HERNIA REPAIR  06/15/2012   Procedure: HERNIA REPAIR INGUINAL INCARCERATED;  Surgeon: Edward Jolly, MD;  Location: [facility];  Service: General;  Laterality: Right;  . JOINT REPLACEMENT    . KNEE DISLOCATION SURGERY  1966  . LAPAROTOMY N/A 11/17/2015   Procedure: EXPLORATORY LAPAROTOMY;  Surgeon: Donnie Mesa, MD;  Location: [place];  Service: General;  Laterality: N/A;  . LAPAROTOMY N/A 11/19/2015   Procedure: EXPLORATORY LAPAROTOMY OPEN ABDOMEN ABDOMINAL WOUND VAC CHANGE;  Surgeon: Ralene Ok, MD;  Location: [place];  Service: General;  Laterality: N/A;  . [place]   s/p crush injury  . SMALL BOWEL REPAIR N/A 11/21/2015   Procedure: SMALL BOWEL REPAIR, SMALL BOWEL ANATAMOSIS AND ABDOMINAL CLOSURE;  Surgeon: Coralie Keens, MD;  Location: [place];  Service: General;  Laterality: N/A;  . TOTAL KNEE ARTHROPLASTY  11/20/2011   Procedure: TOTAL KNEE ARTHROPLASTY;  Surgeon: Ninetta Lights, MD;  Location: [place];  Service: Orthopedics;  Laterality: Left;  DR Percell Miller WANTS 90MINUTES FOR THIS CASE       Home Medications    Prior to Admission medications   Medication Sig Start Date End Date Taking? Authorizing Provider  acetaminophen (TYLENOL) 650 MG CR tablet Take 1,300 mg by mouth every 8 (eight) hours as needed for pain.    [provider]  Artificial Tear Ointment (DRY EYES OP) Apply 1 drop to eye daily as needed (dry eyes).    [provider]  aspirin EC 81 MG tablet Take 1 tablet (81 mg total) by mouth daily. 02/21/17   Raylene Everts, MD  Emollient (VASELINE INTENSIVE CARE EX) Apply 1 application topically daily as needed. For bruise on left hand and both arms.    [provider]  multivitamin-iron-minerals-folic acid  (THERAPEUTIC-M) TABS tablet Take 1 tablet by mouth daily.    [provider]  nicotine (NICODERM CQ - DOSED IN MG/24 HOURS) 21 mg/24hr patch Place 1 patch (21 mg total) onto the skin daily. 04/19/17   Reyne Dumas, MD  ondansetron (ZOFRAN) 4 MG tablet Take 1 tablet (4 mg total) by mouth every 6 (six) hours as needed for nausea. 04/19/17   Benito Mccreedy, MD  pantoprazole (PROTONIX) 40 MG tablet Take 1 tablet (40 mg total) by mouth 2 (two) times daily. 04/18/17   Reyne Dumas, MD    Family History Family History  Problem Relation Age of Onset  . Arthritis Mother   . Heart disease Mother   . Arthritis Father   . Alcohol abuse Father   . Cancer Brother        lung  cancer.  Died in early 9s.  . Alcohol abuse Son   . Drug abuse Son   . Early death Brother        trauma  . Drug abuse Brother   . Colon cancer Neg Hx     Social History Social History  Substance Use Topics  . Smoking status: Current Every Day Smoker    Packs/day: 1.00    Years: 30.00    Types: Cigarettes    Start date: 09/02/1966  . Smokeless tobacco: Never Used  . Alcohol use No     Comment: former etoh abuse, last was 6 months ago     Allergies   Depakote [divalproex sodium] and Codeine   Review of Systems Review of Systems  Constitutional: Negative for activity change, appetite change, chills and fever.  HENT: Negative.   Eyes: Negative.   Respiratory: Negative.   Cardiovascular: Negative.   Gastrointestinal:       Abdominal distension?  Endocrine: Negative.   Genitourinary: Negative.   Musculoskeletal: Negative.   Neurological: Positive for dizziness. Negative for seizures, facial asymmetry, speech difficulty, numbness and headaches.  Hematological: Negative.   Psychiatric/Behavioral: Negative.   All other systems reviewed and are negative.    Physical Exam Updated Vital Signs BP 140/90 (BP Location: Left Arm)   Pulse 67   Temp 98.1 F (36.7 C) (Oral)   Resp 18   Ht 1.803 m (5'  11")   Wt 77.1 kg (170 lb)   SpO2 94%   BMI 23.71 kg/m   Physical Exam  Constitutional: He is oriented to person, place, and time. He appears well-developed and well-nourished.  HENT:  Head: Normocephalic.  Right Ear: External ear normal.  Left Ear: External ear normal.  Nose: Nose normal.  Mouth/Throat: Oropharynx is clear and moist.  Eyes: Pupils are equal, round, and reactive to light.  Multiple beats fatigue above bilateral my statements right greater than left no vertical nystagmus noted  Neck: Normal range of motion. Neck supple.  Cardiovascular: Normal rate and regular rhythm.   Pulmonary/Chest: Effort normal and breath sounds normal.  Abdominal: Soft. Bowel sounds are normal.  Musculoskeletal: Normal range of motion. He exhibits no edema.  Neurological: He is alert and oriented to person, place, and time. He displays normal reflexes. No cranial nerve deficit or sensory deficit. He exhibits normal muscle tone. Coordination normal.  Skin: Skin is warm and dry. Capillary refill takes less than 2 seconds.  Psychiatric: He has a normal mood and affect.  Nursing note and vitals reviewed.    ED Treatments / Results  Labs (all labs ordered are listed, but only abnormal results are displayed) Labs Reviewed  CBC WITH DIFFERENTIAL/PLATELET - Abnormal; Notable for the following:       Result Value   WBC 3.6 (*)    All other components within normal limits  BASIC METABOLIC PANEL  I-STAT TROPONIN, ED    EKG  EKG Interpretation  Date/Time:  Wednesday June 18 2017 08:55:31 EDT Ventricular Rate:  67 PR Interval:    QRS Duration: 103 QT Interval:  465 QTC Calculation: 491 R Axis:   154 Text Interpretation:  Sinus arrhythmia Right bundle branch block Confirmed by Pattricia Boss [phone]) on 06/18/2017 9:31:53 AM       Radiology Mr Brain Wo Contrast  Result Date: 06/18/2017 CLINICAL DATA:  67 year old male with dizziness for 1 day. Weakness. No known injury. EXAM: MRI  HEAD WITHOUT CONTRAST TECHNIQUE: Multiplanar, multiecho pulse sequences of the brain and surrounding  structures were obtained without intravenous contrast. COMPARISON:  Head CT without contrast 04/18/2017 and earlier. [facility] Brain MRI 04/01/2016. FINDINGS: The examination had to be discontinued prior to completion due to patient agitation. Brain: Stable cerebral volume since 2017. No restricted diffusion to suggest acute infarction. No midline shift, mass effect, evidence of mass lesion, ventriculomegaly, extra-axial collection or acute intracranial hemorrhage. Cervicomedullary junction and pituitary are within normal limits. 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 Superimposed widespread increased perivascular spaces. No definite cortical encephalomalacia. T2* imaging was not obtained today. Deep gray matter nuclei, brainstem, and cerebellum appear stable and negative. Vascular: Major intracranial vascular flow voids are stable. The distal left vertebral artery is dominant. Skull and upper cervical spine: Negative. Normal bone marrow signal. Sinuses/Orbits: Stable and negative. Other: Visible internal auditory structures appear stable and within normal limits. Mastoids are clear. Negative scalp soft tissues. IMPRESSION: 1. No acute intracranial abnormality and stable noncontrast MRI appearance of the brain since 2017. 2. Suspected chronic cerebral white matter ischemia superimposed on widespread prominent perivascular spaces. 3.  The examination had to be discontinued prior to completion. Electronically Signed   By: Genevie Ann M.D.   On: 06/18/2017 10:49    Procedures Procedures (including critical care time)  Medications Ordered in ED Medications - No data to display   Initial Impression / Assessment and Plan / ED Course  I have reviewed  the triage vital signs and the nursing notes.  Pertinent labs & imaging results that were available during my care of the patient were reviewed by me and considered in my medical decision making (see chart for details).     66 year old male with some memory loss presents today with vertigo.MRI here reveals no evidence of stroke. Patient symptomatically improved after meclizine. He has a mild hypokalemia and will have some oral repletion. He also has a mild leukopenia. This will need to be rechecked.  Final Clinical Impressions(s) / ED Diagnoses   Final diagnoses:  Dizziness  Hypokalemia  Leukopenia, unspecified type    New Prescriptions New Prescriptions   MECLIZINE (ANTIVERT) 12.5 MG TABLET    Take 1 tablet (12.5 mg total) by mouth 3 (three) times daily as needed for dizziness.   POTASSIUM CHLORIDE SA (K-DUR,KLOR-CON) 20 MEQ TABLET    Take 1 tablet (20 mEq total) by mouth once.     Pattricia Boss, MD 06/18/17 1058

## 2017-06-18 NOTE — Discharge Instructions (Signed)
Take meclizine as needed for dizziness Walk with assistance Recheck with your Dr. For recheck of potassium and white blood cell count in 2-4 days Return if worse anytime.

## 2017-06-18 NOTE — ED Notes (Signed)
Pt denies any dizziness at this time

## 2017-06-18 NOTE — ED Notes (Signed)
Pt taken to MRI  

## 2017-06-18 NOTE — ED Triage Notes (Signed)
Pt c/o dizziness since last night.  Reports is supposed to take meclizine daily and ran out 2 weeks ago.

## 2017-06-20 ENCOUNTER — Encounter: Payer: Self-pay | Admitting: Family Medicine

## 2017-06-20 ENCOUNTER — Ambulatory Visit (INDEPENDENT_AMBULATORY_CARE_PROVIDER_SITE_OTHER): Payer: Medicare Other | Admitting: Family Medicine

## 2017-06-20 ENCOUNTER — Other Ambulatory Visit: Payer: Self-pay

## 2017-06-20 VITALS — BP 100/58 | HR 72 | Temp 96.6°F | Resp 18 | Ht 72.0 in | Wt 157.1 lb

## 2017-06-20 DIAGNOSIS — Z23 Encounter for immunization: Secondary | ICD-10-CM

## 2017-06-20 DIAGNOSIS — Z8639 Personal history of other endocrine, nutritional and metabolic disease: Secondary | ICD-10-CM | POA: Diagnosis not present

## 2017-06-20 DIAGNOSIS — R42 Dizziness and giddiness: Secondary | ICD-10-CM

## 2017-06-20 DIAGNOSIS — Z87891 Personal history of nicotine dependence: Secondary | ICD-10-CM

## 2017-06-20 DIAGNOSIS — R634 Abnormal weight loss: Secondary | ICD-10-CM | POA: Diagnosis not present

## 2017-06-20 MED ORDER — MECLIZINE HCL 12.5 MG PO TABS: 12.5000 mg | ORAL_TABLET | Freq: Three times a day (TID) | ORAL | 0 refills | Status: DC | PRN

## 2017-06-20 NOTE — Progress Notes (Signed)
Chief Complaint  Patient presents with  . Follow-up    HHA   Patient is here for emergency room follow-up. He was seen in [facility] emergency room on 06/18/17 for dizziness.  He had lab work performed, and an MRI of his brain.  These tests are reviewed with him.  They refilled his meclizine.  He states that now he is taking meclizine he no longer has dizziness. He no longer lives with his son.  He lives alone in a right controlled apartment.  He has scant furniture.  He does not drive and has no ability to get groceries.  He is paying his own bills.  He brings with him forms from [place] to get assistance in his home. He has lost weight.  He is thin.  He states he is eating well.  He has a history of malnutrition, self-neglect, and debility. He states he has had multiple falls.  He is currently using a walker.  He has not fallen since he was seen in the emergency room.  No musculoskeletal complaints today. He continues to smoke on a daily basis.  He feels unable to quit. He no longer drinks alcohol. He feels his diet is adequate.  He tries to eat a balanced diet with protein on a daily basis. He denies any chest pain, palpitations, dizziness or syncope. He denies any problems with bowels or digestion, constipation, diarrhea, or blood in bowels. He states he has some urinary frequency and nocturia but no difficulty urinating and no incontinence. He denies any joint pain or arthritis. He states in his new apartment he has been making friends.  He goes to AA twice a week.  He walks to church once a week.  He denies any depression anxiety or loneliness.  Patient Active Problem List   Diagnosis Date Noted  . Rectal bleeding   . Upper GI bleed 04/17/2017  . Vertigo 04/17/2017  . Upper GI bleeding 04/17/2017  . Coffee ground emesis   . Abdominal pain   . RBBB 02/21/2017  . Osteopenia determined by x-ray 02/21/2017  . Vitamin B 12 deficiency 02/21/2017  . History of  smoking 30 or more pack years 02/21/2017  . Vitamin D deficiency 02/21/2017  . Debilitated 11/29/2015  . Paroxysmal atrial fibrillation (HCC)   . Acute on chronic combined systolic and diastolic CHF (congestive heart failure) ([place])   . Cardiomyopathy, ischemic 11/19/2015  . Cognitive dysfunction, alcohol-related ([place]) 01/03/2014  . Periodontal disease 12/24/2013  . Alcohol dependence ([place]) 04/12/2013  . Bipolar I disorder, most recent episode depressed ([place]) 04/12/2013  . H/O: substance abuse 08/06/2012  . Tobacco abuse 08/06/2012  . History of hernia repair 08/06/2012  . Prostate cancer screening 09/19/2011  . History of depression 08/06/2011    Outpatient Encounter Prescriptions as of 06/20/2017  Medication Sig  . acetaminophen (TYLENOL) 650 MG CR tablet Take 1,300 mg by mouth every 8 (eight) hours as needed for pain.  . Artificial Tear Ointment (DRY EYES OP) Apply 1 drop to eye daily as needed (dry eyes).  Marland Kitchen aspirin EC 81 MG tablet Take 1 tablet (81 mg total) by mouth daily.  . Emollient (VASELINE INTENSIVE CARE EX) Apply 1 application topically daily as needed. For bruise on left hand and both arms.  . meclizine (ANTIVERT) 12.5 MG tablet Take 1 tablet (12.5 mg total) by mouth 3 (three) times daily as needed for dizziness.  . multivitamin-iron-minerals-folic acid (THERAPEUTIC-M) TABS tablet Take 1 tablet by mouth  daily.  . pantoprazole (PROTONIX) 40 MG tablet Take 1 tablet (40 mg total) by mouth 2 (two) times daily.  . potassium chloride SA (K-DUR,KLOR-CON) 20 MEQ tablet Take 1 tablet (20 mEq total) by mouth once.   No facility-administered encounter medications on file as of 06/20/2017.     Allergies  Allergen Reactions  . Depakote [Divalproex Sodium] Other (See Comments)    "Makes me crazy"  . Codeine Nausea Only and Other (See Comments)    flushing    Review of Systems  Constitutional: Positive for appetite change, fatigue and unexpected weight change. Negative for  activity change.  HENT: Negative for congestion and dental problem.   Eyes: Negative for photophobia and visual disturbance.  Respiratory: Positive for shortness of breath. Negative for cough.        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  Gastrointestinal: Negative for blood in stool and constipation.  Genitourinary: Positive for frequency. Negative for difficulty urinating, dysuria and flank pain.  Musculoskeletal: Positive for gait problem. Negative for arthralgias and back pain.       Falls and instability  Neurological: Positive for dizziness. Negative for light-headedness.       Improved on meclizine  Psychiatric/Behavioral: Negative for decreased concentration and sleep disturbance. The patient is not nervous/anxious.     BP (!) 100/58 (BP Location: Left Arm, Patient Position: Sitting, Cuff Size: Normal)   Pulse 72   Temp (!) 96.6 F (35.9 C) (Temporal)   Resp 18   Ht 6' (1.829 m)   Wt 157 lb 1.3 oz (71.3 kg)   SpO2 96%   BMI 21.30 kg/m   Physical Exam  Constitutional: He appears well-developed.  Thin, pale, stooped, looks older than age, poor general health  HENT:  Head: Normocephalic and atraumatic.  Right Ear: External ear normal.  Left Ear: External ear normal.  Mouth/Throat: Oropharynx is clear and moist.  Upper teeth absent, lower teeth with fractures, caries, diseased  Eyes: Pupils are equal, round, and reactive to light. Conjunctivae are normal.  Neck: Normal range of motion. No thyromegaly present.  Cardiovascular: Normal rate, regular rhythm and normal heart sounds.   Pulmonary/Chest: Effort normal and breath sounds normal. No respiratory distress. He has no wheezes.  Abdominal: Soft. Bowel sounds are normal. There is tenderness.  Mild tenderness deep palpation centrally  Musculoskeletal: Normal range of motion. He exhibits no edema.  Moves slowly.  Muscle atrophy of hands, wasting  Lymphadenopathy:    He  has no cervical adenopathy.  Neurological: He is alert. He displays normal reflexes. Coordination abnormal.  balance poor.  Uses walker.  Walks with small steps.  Psychiatric: His behavior is normal.  Mood, head in hands.  Short term memory impaired    ASSESSMENT/PLAN:  1. Vertigo Improved on meclizine  2. Weight loss Patient unaware  3. History of malnutrition Per chart  4. History of smoking 30 or more pack years Patient unable/unwilling to quit  5. Need for pneumococcal vaccination Given - Pneumococcal conjugate vaccine 13-valent IM Greater than 50% of this visit was spent in counseling and coordinating care.  Total face to face time:   45 minutes.  I filled out HHS  forms, discussed his home health needs and safety.   Patient Instructions  Take the meclizine as needed dizziness Try to cut down ot stop smoking Stay as active as you can manage See me every 6 months Call sooner for problems   Raylene Everts, MD

## 2017-06-20 NOTE — Patient Instructions (Addendum)
Take the meclizine as needed dizziness Try to cut down ot stop smoking Stay as active as you can manage See me every 6 months Call sooner for problems

## 2017-06-30 ENCOUNTER — Ambulatory Visit: Payer: Self-pay | Admitting: Family Medicine

## 2017-07-01 ENCOUNTER — Telehealth: Payer: Self-pay | Admitting: Family Medicine

## 2017-07-01 MED ORDER — ACETAMINOPHEN ER 650 MG PO TBCR: 1300.0000 mg | EXTENDED_RELEASE_TABLET | Freq: Three times a day (TID) | ORAL | 0 refills | Status: DC | PRN

## 2017-07-01 MED ORDER — MECLIZINE HCL 12.5 MG PO TABS: 12.5000 mg | ORAL_TABLET | Freq: Three times a day (TID) | ORAL | 0 refills | Status: DC | PRN

## 2017-07-01 NOTE — Telephone Encounter (Signed)
Meclizine was written 10 19 18  # 90.   is he still taking the K?

## 2017-07-01 NOTE — Telephone Encounter (Signed)
Pt aware.

## 2017-07-01 NOTE — Telephone Encounter (Signed)
Patient called in complaining of pain on the top of his head radiating into his neck, he says this pain is excrusiating and is requesting pain medication. Nurse says he seems dizzy and light headed.  Please send to [facility] If he needs to come in for an appt they are available to do so. Cb#: [phone]

## 2017-07-01 NOTE — Telephone Encounter (Signed)
I was able to talk to williams assistant and get his home number. He states his head pain is better and just has a neck ache. I have refilled his tylenol 650 mg for him.

## 2017-07-01 NOTE — Telephone Encounter (Signed)
Do not refill the meclizine if he is not taking it correctly.  Does not need ongoing potassium until we check another potassium level.

## 2017-07-01 NOTE — Telephone Encounter (Signed)
I have attempted to call number listed..says the mailbox is full and can not take any messages.

## 2017-07-01 NOTE — Telephone Encounter (Signed)
Patient's nurse called in a refill for the following medications, she is requesting them to be sent to [facility]  meclizine and potasium

## 2017-07-08 ENCOUNTER — Encounter (HOSPITAL_COMMUNITY): Payer: Self-pay | Admitting: Emergency Medicine

## 2017-07-08 ENCOUNTER — Emergency Department (HOSPITAL_COMMUNITY)
Admission: EM | Admit: 2017-07-08 | Discharge: 2017-07-08 | Disposition: A | Discharge status: Home / Self Care | Payer: Medicare Other | Attending: Emergency Medicine | Admitting: Emergency Medicine

## 2017-07-08 ENCOUNTER — Other Ambulatory Visit: Payer: Self-pay

## 2017-07-08 DIAGNOSIS — Z79899 Other long term (current) drug therapy: Secondary | ICD-10-CM | POA: Diagnosis not present

## 2017-07-08 DIAGNOSIS — N189 Chronic kidney disease, unspecified: Secondary | ICD-10-CM | POA: Insufficient documentation

## 2017-07-08 DIAGNOSIS — I5042 Chronic combined systolic (congestive) and diastolic (congestive) heart failure: Secondary | ICD-10-CM | POA: Diagnosis not present

## 2017-07-08 DIAGNOSIS — F17210 Nicotine dependence, cigarettes, uncomplicated: Secondary | ICD-10-CM | POA: Diagnosis not present

## 2017-07-08 DIAGNOSIS — Z85828 Personal history of other malignant neoplasm of skin: Secondary | ICD-10-CM | POA: Insufficient documentation

## 2017-07-08 DIAGNOSIS — Z7982 Long term (current) use of aspirin: Secondary | ICD-10-CM | POA: Diagnosis not present

## 2017-07-08 DIAGNOSIS — R42 Dizziness and giddiness: Secondary | ICD-10-CM | POA: Diagnosis not present

## 2017-07-08 MED ORDER — MECLIZINE HCL 12.5 MG PO TABS: 12.5000 mg | ORAL_TABLET | Freq: Three times a day (TID) | ORAL | 1 refills | Status: DC | PRN

## 2017-07-08 MED ORDER — MECLIZINE HCL 12.5 MG PO TABS
25.0000 mg | ORAL_TABLET | Freq: Once | ORAL | Status: AC
  Administered 2017-07-08: 25 mg via ORAL
  Filled 2017-07-08: qty 2

## 2017-07-08 NOTE — ED Triage Notes (Signed)
Pt reports is out of meclizine and potassium for last several days. Pt reports recently switched pharmacy's and "medication never got filled."   Per chart, pt seen for same on 06/18/17 and reports ran out of this prescription as well. nad noted.

## 2017-07-08 NOTE — ED Provider Notes (Signed)
[facility] EMERGENCY DEPARTMENT Provider Note   CSN: [id number] Arrival date & time: 07/08/17  4166     History   Chief Complaint Chief Complaint  Patient presents with  . Dizziness    HPI Shawn [name] is a 66 y.o. male.  HPI  66 y/o male Hx of Vertigo, and states he has been treated with meclizine successfully -he has had extensive workup including CT scan of the brain, MRI of the brain as recently as 2 weeks ago which showed no signs of acute stroke or other abnormalities that would be causative of vertigo.  He reports that he is in his normal state of health until he tries to move his head in which case that brings on the vertigo.  He ran out of his meclizine several days ago and since that time is had intolerable vertigo.  At this time he is laying in the supine position and has no complaints including no vertigo chest pain palpitations or any other complaints including visual disturbances, difficulty with speech or coordination.  Past Medical History:  Diagnosis Date  . Alcoholism ([place])    "Dry" since 2002; relapse 2014  . Allergy   . Anemia   . Anxiety   . Anxiety and depression   . Bipolar disorder [facility])    Has had multiple psych hospitalization in the past, most recently at [facility] 01/31/11-02/04/11.  Says meds made him emotionally blunted. (lamictal and wellbutrin).  . Cancer ([place])    skin  . CHF (congestive heart failure) ([place])   . Chronic kidney disease   . Depression   . Diverticular disease    "itis" x 2 episodes (last was about 2007)  . Fracture of metatarsal bone(s), closed    3rd, 4th, 5th mid shaft on left foot  . History of septic shock 11/2015   ? due to small bowel ischemia: hospitalized + surgeries 11/2015  . History of substance abuse    cocaine, marijuana, acid, alcohol (in remission since 2010)  . Multiple rib fractures 10/09/10   s/p fall down a ravine--9th and 10th on right, contusion of left 7th and 8th ribs  . Osteoarthritis X  years   left knee.  Distant hx (age 10) "dislocated" knee and required surgery, then crush injury to patella required knee cap removal.  . Osteoporosis   . Solar dermatitis    face and ears  . Substance abuse ([place])   . Tobacco dependence   . Vitamin B12 deficiency 10/2013   IF ab positive.  Replacement therapy started end of March 2015    Patient Active Problem List   Diagnosis Date Noted  . Rectal bleeding   . Upper GI bleed 04/17/2017  . Vertigo 04/17/2017  . Upper GI bleeding 04/17/2017  . Coffee ground emesis   . Abdominal pain   . RBBB 02/21/2017  . Osteopenia determined by x-ray 02/21/2017  . Vitamin B 12 deficiency 02/21/2017  . History of smoking 30 or more pack years 02/21/2017  . Vitamin D deficiency 02/21/2017  . Debilitated 11/29/2015  . Paroxysmal atrial fibrillation (HCC)   . Acute on chronic combined systolic and diastolic CHF (congestive heart failure) ([place])   . Cardiomyopathy, ischemic 11/19/2015  . Cognitive dysfunction, alcohol-related ([place]) 01/03/2014  . Periodontal disease 12/24/2013  . Alcohol dependence ([place]) 04/12/2013  . Bipolar I disorder, most recent episode depressed ([place]) 04/12/2013  . H/O: substance abuse 08/06/2012  . Tobacco abuse 08/06/2012  . History of hernia repair 08/06/2012  .  Prostate cancer screening 09/19/2011  . History of depression 08/06/2011    Past Surgical History:  Procedure Laterality Date  . HERNIA REPAIR     at 66 years of age  76  . INGUINAL HERNIA REPAIR     left  . JOINT REPLACEMENT    . KNEE DISLOCATION SURGERY  1966  . PATELLECTOMY  1968   s/p crush injury       Home Medications    Prior to Admission medications   Medication Sig Start Date End Date Taking? Authorizing Provider  acetaminophen (TYLENOL) 650 MG CR tablet Take 2 tablets (1,300 mg total) by mouth every 8 (eight) hours as needed for pain. 07/01/17   Raylene Everts, MD  Artificial Tear Ointment (DRY EYES OP) Apply 1 drop to eye daily as  needed (dry eyes).    [provider]  aspirin EC 81 MG tablet Take 1 tablet (81 mg total) by mouth daily. 02/21/17   Raylene Everts, MD  Emollient (VASELINE INTENSIVE CARE EX) Apply 1 application topically daily as needed. For bruise on left hand and both arms.    [provider]  meclizine (ANTIVERT) 12.5 MG tablet Take 1 tablet (12.5 mg total) 3 (three) times daily as needed by mouth for dizziness. 07/08/17   Noemi Chapel, MD  multivitamin-iron-minerals-folic acid [facility]) TABS tablet Take 1 tablet by mouth daily.    [provider]  pantoprazole (PROTONIX) 40 MG tablet Take 1 tablet (40 mg total) by mouth 2 (two) times daily. 04/18/17   Reyne Dumas, MD  potassium chloride SA (K-DUR,KLOR-CON) 20 MEQ tablet Take 1 tablet (20 mEq total) by mouth once. 06/18/17 06/18/17  Pattricia Boss, MD    Family History Family History  Problem Relation Age of Onset  . Arthritis Mother   . Heart disease Mother   . Arthritis Father   . Alcohol abuse Father   . Cancer Brother        lung cancer.  Died in early 67s.  . Alcohol abuse Son   . Drug abuse Son   . Early death Brother        trauma  . Drug abuse Brother   . Colon cancer Neg Hx     Social History Social History   Tobacco Use  . Smoking status: Current Every Day Smoker    Packs/day: 1.00    Years: 30.00    Pack years: 30.00    Types: Cigarettes    Start date: 09/02/1966  . Smokeless tobacco: Never Used  Substance Use Topics  . Alcohol use: No    Comment: former etoh abuse, last was 6 months ago  . Drug use: No     Allergies   Depakote [divalproex sodium] and Codeine   Review of Systems Review of Systems  Constitutional: Negative for fever.  Neurological: Positive for dizziness. Negative for weakness, numbness and headaches.     Physical Exam Updated Vital Signs BP 128/74 (BP Location: Right Arm)   Pulse 82   Temp 97.7 F (36.5 C) (Oral)   Resp 18   Ht 6' (1.829 m)   Wt 71.2 kg  (157 lb)   SpO2 96%   BMI 21.29 kg/m   Physical Exam  Constitutional: He appears well-developed and well-nourished. No distress.  HENT:  Head: Normocephalic and atraumatic.  Mouth/Throat: Oropharynx is clear and moist. No oropharyngeal exudate.  Eyes: Conjunctivae and EOM are normal. Pupils are equal, round, and reactive to light. Right eye exhibits no discharge. Left  eye exhibits no discharge. No scleral icterus.  Neck: Normal range of motion. Neck supple. No JVD present. No thyromegaly present.  Cardiovascular: Normal rate, regular rhythm, normal heart sounds and intact distal pulses. Exam reveals no gallop and no friction rub.  No murmur heard. Pulmonary/Chest: Effort normal and breath sounds normal. No respiratory distress. He has no wheezes. He has no rales.  Abdominal: Soft. Bowel sounds are normal. He exhibits no distension and no mass. There is no tenderness.  Musculoskeletal: Normal range of motion. He exhibits no edema or tenderness.  Lymphadenopathy:    He has no cervical adenopathy.  Neurological: He is alert. Coordination normal.  Speech is clear, cranial nerves III through XII are intact, memory is intact, strength is normal in all 4 extremities including grips, sensation is intact to light touch and pinprick in all 4 extremities. Coordination as tested by finger-nose-finger is normal, no limb ataxia. Normal gait, normal reflexes at the patellar tendons bilaterally  Skin: Skin is warm and dry. No rash noted. No erythema.  Psychiatric: He has a normal mood and affect. His behavior is normal.  Nursing note and vitals reviewed.    ED Treatments / Results  Labs (all labs ordered are listed, but only abnormal results are displayed) Labs Reviewed - No data to display   Radiology No results found.  Procedures Procedures (including critical care time)  Medications Ordered in ED Medications  meclizine (ANTIVERT) tablet 25 mg (not administered)     Initial Impression  / Assessment and Plan / ED Course  I have reviewed the triage vital signs and the nursing notes.  Pertinent labs & imaging results that were available during my care of the patient were reviewed by me and considered in my medical decision making (see chart for details).     No indication for workup as the patient has had this recently.  Symptoms are similar and at this time the patient appears stable to be given a dose of meclizine and a refill of his prescription.  He expressed his understanding.  He does have a local family doctor.  Final Clinical Impressions(s) / ED Diagnoses   Final diagnoses:  Vertigo    ED Discharge Orders        Ordered    meclizine (ANTIVERT) 12.5 MG tablet  3 times daily PRN     07/08/17 0724       Noemi Chapel, MD 07/08/17 0725

## 2017-07-09 ENCOUNTER — Telehealth: Payer: Self-pay

## 2017-07-09 NOTE — Telephone Encounter (Signed)
Attempted to call pt, no answer. 

## 2017-07-09 NOTE — Telephone Encounter (Signed)
I have tried to call Shawn [name] several times, his voice mail says hes not accepting calls.Marland KitchenMarland KitchenMarland Kitchen

## 2017-10-14 ENCOUNTER — Emergency Department (HOSPITAL_COMMUNITY): Payer: Medicare Other

## 2017-10-14 ENCOUNTER — Encounter (HOSPITAL_COMMUNITY): Payer: Self-pay | Admitting: Emergency Medicine

## 2017-10-14 ENCOUNTER — Emergency Department (HOSPITAL_COMMUNITY)
Admission: EM | Admit: 2017-10-14 | Discharge: 2017-10-15 | Disposition: A | Discharge status: Home / Self Care | Payer: Medicare Other | Attending: Emergency Medicine | Admitting: Emergency Medicine

## 2017-10-14 ENCOUNTER — Other Ambulatory Visit: Payer: Self-pay

## 2017-10-14 DIAGNOSIS — I130 Hypertensive heart and chronic kidney disease with heart failure and stage 1 through stage 4 chronic kidney disease, or unspecified chronic kidney disease: Secondary | ICD-10-CM | POA: Diagnosis not present

## 2017-10-14 DIAGNOSIS — R112 Nausea with vomiting, unspecified: Secondary | ICD-10-CM | POA: Insufficient documentation

## 2017-10-14 DIAGNOSIS — Z7982 Long term (current) use of aspirin: Secondary | ICD-10-CM | POA: Insufficient documentation

## 2017-10-14 DIAGNOSIS — Z79899 Other long term (current) drug therapy: Secondary | ICD-10-CM | POA: Diagnosis not present

## 2017-10-14 DIAGNOSIS — N189 Chronic kidney disease, unspecified: Secondary | ICD-10-CM | POA: Diagnosis not present

## 2017-10-14 DIAGNOSIS — R509 Fever, unspecified: Secondary | ICD-10-CM | POA: Diagnosis not present

## 2017-10-14 DIAGNOSIS — Z96652 Presence of left artificial knee joint: Secondary | ICD-10-CM | POA: Insufficient documentation

## 2017-10-14 DIAGNOSIS — F17210 Nicotine dependence, cigarettes, uncomplicated: Secondary | ICD-10-CM | POA: Insufficient documentation

## 2017-10-14 DIAGNOSIS — I509 Heart failure, unspecified: Secondary | ICD-10-CM | POA: Insufficient documentation

## 2017-10-14 DIAGNOSIS — R197 Diarrhea, unspecified: Secondary | ICD-10-CM | POA: Insufficient documentation

## 2017-10-14 DIAGNOSIS — R1030 Lower abdominal pain, unspecified: Secondary | ICD-10-CM | POA: Diagnosis not present

## 2017-10-14 DIAGNOSIS — Z85828 Personal history of other malignant neoplasm of skin: Secondary | ICD-10-CM | POA: Diagnosis not present

## 2017-10-14 LAB — COMPREHENSIVE METABOLIC PANEL
ALBUMIN: 3.9 g/dL (ref 3.5–5.0)
ALK PHOS: 77 U/L (ref 38–126)
ALT: 7 U/L — ABNORMAL LOW (ref 17–63)
AST: 15 U/L (ref 15–41)
Anion gap: 10 (ref 5–15)
BILIRUBIN TOTAL: 0.6 mg/dL (ref 0.3–1.2)
BUN: 14 mg/dL (ref 6–20)
CO2: 25 mmol/L (ref 22–32)
Calcium: 9.1 mg/dL (ref 8.9–10.3)
Chloride: 102 mmol/L (ref 101–111)
Creatinine, Ser: 0.97 mg/dL (ref 0.61–1.24)
GFR calc Af Amer: >60 mL/min (ref >60)
GFR calc non Af Amer: >60 mL/min (ref >60)
GLUCOSE: 99 mg/dL (ref 65–99)
Potassium: 3.6 mmol/L (ref 3.5–5.1)
Sodium: 137 mmol/L (ref 135–145)
TOTAL PROTEIN: 6.9 g/dL (ref 6.5–8.1)

## 2017-10-14 LAB — LIPASE, BLOOD: Lipase: 22 U/L (ref 11–51)

## 2017-10-14 LAB — CBC WITH DIFFERENTIAL/PLATELET
BASOS PCT: 1 %
Basophils Absolute: 0 10*3/uL (ref 0.0–0.1)
Eosinophils Absolute: 0 10*3/uL (ref 0.0–0.7)
Eosinophils Relative: 1 %
HEMATOCRIT: 43.6 % (ref 39.0–52.0)
Hemoglobin: 14.6 g/dL (ref 13.0–17.0)
LYMPHS ABS: 0.6 10*3/uL — AB (ref 0.7–4.0)
Lymphocytes Relative: 18 %
MCH: 30.9 pg (ref 26.0–34.0)
MCHC: 33.5 g/dL (ref 30.0–36.0)
MCV: 92.2 fL (ref 78.0–100.0)
MONO ABS: 0.3 10*3/uL (ref 0.1–1.0)
MONOS PCT: 7 %
NEUTROS ABS: 2.6 10*3/uL (ref 1.7–7.7)
Neutrophils Relative %: 73 %
Platelets: 197 10*3/uL (ref 150–400)
RBC: 4.73 MIL/uL (ref 4.22–5.81)
RDW: 13.8 % (ref 11.5–15.5)
WBC: 3.6 10*3/uL — ABNORMAL LOW (ref 4.0–10.5)

## 2017-10-14 LAB — URINALYSIS, ROUTINE W REFLEX MICROSCOPIC
Bilirubin Urine: NEGATIVE
GLUCOSE, UA: NEGATIVE mg/dL
Hgb urine dipstick: NEGATIVE
Ketones, ur: NEGATIVE mg/dL
LEUKOCYTES UA: NEGATIVE
NITRITE: NEGATIVE
PROTEIN: NEGATIVE mg/dL
Specific Gravity, Urine: 1.015 (ref 1.005–1.030)
pH: 5 (ref 5.0–8.0)

## 2017-10-14 MED ORDER — ONDANSETRON HCL 4 MG/2ML IJ SOLN
4.0000 mg | Freq: Once | INTRAMUSCULAR | Status: AC
  Administered 2017-10-14: 4 mg via INTRAVENOUS
  Filled 2017-10-14: qty 2

## 2017-10-14 MED ORDER — SODIUM CHLORIDE 0.9 % IV BOLUS (SEPSIS)
1000.0000 mL | Freq: Once | INTRAVENOUS | Status: AC
  Administered 2017-10-14: 1000 mL via INTRAVENOUS

## 2017-10-14 MED ORDER — IOPAMIDOL (ISOVUE-300) INJECTION 61%
100.0000 mL | Freq: Once | INTRAVENOUS | Status: AC | PRN
  Administered 2017-10-14: 100 mL via INTRAVENOUS

## 2017-10-14 NOTE — ED Triage Notes (Signed)
Patient reports L flank pain that has been going on for a couple of weeks. Reports polyuria and nausea. Patient states he has not noticed any hematuria.

## 2017-10-15 MED ORDER — ONDANSETRON 4 MG PO TBDP: 4.0000 mg | ORAL_TABLET | Freq: Three times a day (TID) | ORAL | 0 refills | Status: DC | PRN

## 2017-10-15 NOTE — ED Provider Notes (Signed)
[facility] EMERGENCY DEPARTMENT Provider Note   CSN: [id number] Arrival date & time: 10/14/17  1647     History   Chief Complaint Chief Complaint  Patient presents with  . Flank Pain  . Abdominal Pain    HPI Shawn [name] is a 67 y.o. male with a past medical history of chronic kidney disease,  diverticulitis, bipolar disorder, depression, and a history of polysubstance abuse including alcohol, stating he has had no EtOH in the past several weeks presenting with abdominal pain nausea and increased urinary frequency.  He describes pain that originates in his suprapubic region and radiates up into his left upper abdomen which is been waxing and waning for the past 2 weeks.  He also endorses subjective fever, nausea with nonbloody emesis and nonbloody diarrhea, reporting approximately 4 episodes of diarrhea today.  He has had decreased appetite but denies weight loss.  He denies back or flank pain.  Additionally he describes a sensation in his  left knee which started at the same time as his abdominal pain, he describes "it feels like my knee is going to fall off my body".  He has had no medications or is found no alleviators for his symptoms.  The history is provided by the patient.  Abdominal Pain   Associated symptoms include fever, nausea, vomiting, dysuria and arthralgias. Pertinent negatives include hematuria and headaches.    Past Medical History:  Diagnosis Date  . Alcoholism ([place])    "Dry" since 2002; relapse 2014  . Allergy   . Anemia   . Anxiety   . Anxiety and depression   . Bipolar disorder [facility])    Has had multiple psych hospitalization in the past, most recently at [place] [facility] 01/31/11-02/04/11.  Says meds made him emotionally blunted. (lamictal and wellbutrin).  . Cancer ([place])    skin  . CHF (congestive heart failure) ([place])   . Chronic kidney disease   . Depression   . Diverticular disease    "itis" x 2 episodes (last was about 2007)  . Fracture of  metatarsal bone(s), closed    3rd, 4th, 5th mid shaft on left foot  . History of septic shock 11/2015   ? due to small bowel ischemia: hospitalized + surgeries 11/2015  . History of substance abuse    cocaine, marijuana, acid, alcohol (in remission since 2010)  . Multiple rib fractures 10/09/10   s/p fall down a ravine--9th and 10th on right, contusion of left 7th and 8th ribs  . Osteoarthritis X years   left knee.  Distant hx (age 12) "dislocated" knee and required surgery, then crush injury to patella required knee cap removal.  . Osteoporosis   . Solar dermatitis    face and ears  . Substance abuse ([place])   . Tobacco dependence   . Vitamin B12 deficiency 10/2013   IF ab positive.  Replacement therapy started end of March 2015    Patient Active Problem List   Diagnosis Date Noted  . Rectal bleeding   . Upper GI bleed 04/17/2017  . Vertigo 04/17/2017  . Upper GI bleeding 04/17/2017  . Coffee ground emesis   . Abdominal pain   . RBBB 02/21/2017  . Osteopenia determined by x-ray 02/21/2017  . Vitamin B 12 deficiency 02/21/2017  . History of smoking 30 or more pack years 02/21/2017  . Vitamin D deficiency 02/21/2017  . Debilitated 11/29/2015  . Paroxysmal atrial fibrillation (HCC)   . Acute on chronic combined systolic and diastolic  CHF (congestive heart failure) ([place])   . Cardiomyopathy, ischemic 11/19/2015  . Cognitive dysfunction, alcohol-related ([place]) 01/03/2014  . Periodontal disease 12/24/2013  . Alcohol dependence ([place]) 04/12/2013  . Bipolar I disorder, most recent episode depressed ([place]) 04/12/2013  . H/O: substance abuse 08/06/2012  . Tobacco abuse 08/06/2012  . History of hernia repair 08/06/2012  . Prostate cancer screening 09/19/2011  . History of depression 08/06/2011    Past Surgical History:  Procedure Laterality Date  . BOWEL RESECTION  06/15/2012   Procedure: SMALL BOWEL RESECTION;  Surgeon: Edward Jolly, MD;  Location: [facility];  Service: General;   Laterality: N/A;  . BOWEL RESECTION  11/19/2015   Procedure: SMALL BOWEL RESECTION;  Surgeon: Ralene Ok, MD;  Location: [place];  Service: General;;  . CARDIAC CATHETERIZATION N/A 11/20/2015   Procedure: Temporary Wire;  Surgeon: Evans Lance, MD;  Location: [place] [facility];  Service: Cardiovascular;  Laterality: N/A;  . ESOPHAGOGASTRODUODENOSCOPY (EGD) WITH PROPOFOL N/A 04/17/2017   Procedure: ESOPHAGOGASTRODUODENOSCOPY (EGD) WITH PROPOFOL;  Surgeon: Daneil Dolin, MD;  [facility];  Service: Endoscopy;  Laterality: N/A;  . FEMUR IM NAIL  08/06/2012   Procedure: INTRAMEDULLARY (IM) NAIL FEMORAL;  Surgeon: Johnn Hai, MD;  Location: [facility];  Service: Orthopedics;  Laterality: Left;  . HERNIA REPAIR     at 67 years of age  34  . INGUINAL HERNIA REPAIR     left  . INGUINAL HERNIA REPAIR  06/15/2012   Procedure: HERNIA REPAIR INGUINAL INCARCERATED;  Surgeon: Edward Jolly, MD;  Location: [facility];  Service: General;  Laterality: Right;  . JOINT REPLACEMENT    . KNEE DISLOCATION SURGERY  1966  . LAPAROTOMY N/A 11/17/2015   Procedure: EXPLORATORY LAPAROTOMY;  Surgeon: Donnie Mesa, MD;  Location: [place];  Service: General;  Laterality: N/A;  . LAPAROTOMY N/A 11/19/2015   Procedure: EXPLORATORY LAPAROTOMY OPEN ABDOMEN ABDOMINAL WOUND VAC CHANGE;  Surgeon: Ralene Ok, MD;  Location: [place];  Service: General;  Laterality: N/A;  . [place]   s/p crush injury  . SMALL BOWEL REPAIR N/A 11/21/2015   Procedure: SMALL BOWEL REPAIR, SMALL BOWEL ANATAMOSIS AND ABDOMINAL CLOSURE;  Surgeon: Coralie Keens, MD;  Location: [place];  Service: General;  Laterality: N/A;  . TOTAL KNEE ARTHROPLASTY  11/20/2011   Procedure: TOTAL KNEE ARTHROPLASTY;  Surgeon: Ninetta Lights, MD;  Location: [place];  Service: Orthopedics;  Laterality: Left;  DR Percell Miller WANTS 90MINUTES FOR THIS CASE       Home Medications    Prior to Admission medications   Medication Sig Start Date End  Date Taking? Authorizing Provider  acetaminophen (TYLENOL) 650 MG CR tablet Take 2 tablets (1,300 mg total) by mouth every 8 (eight) hours as needed for pain. 07/01/17   Raylene Everts, MD  Artificial Tear Ointment (DRY EYES OP) Apply 1 drop to eye daily as needed (dry eyes).    [provider]  aspirin EC 81 MG tablet Take 1 tablet (81 mg total) by mouth daily. 02/21/17   Raylene Everts, MD  Emollient (VASELINE INTENSIVE CARE EX) Apply 1 application topically daily as needed. For bruise on left hand and both arms.    [provider]  meclizine (ANTIVERT) 12.5 MG tablet Take 1 tablet (12.5 mg total) 3 (three) times daily as needed by mouth for dizziness. 07/08/17   Noemi Chapel, MD  multivitamin-iron-minerals-folic acid [facility]) TABS tablet Take 1 tablet by mouth daily.  [provider]  ondansetron (ZOFRAN ODT) 4 MG disintegrating tablet Take 1 tablet (4 mg total) by mouth every 8 (eight) hours as needed for nausea or vomiting. 10/15/17   Laketta Soderberg, Almyra Free, PA-C  pantoprazole (PROTONIX) 40 MG tablet Take 1 tablet (40 mg total) by mouth 2 (two) times daily. 04/18/17   Reyne Dumas, MD  potassium chloride SA (K-DUR,KLOR-CON) 20 MEQ tablet Take 1 tablet (20 mEq total) by mouth once. 06/18/17 06/18/17  Pattricia Boss, MD    Family History Family History  Problem Relation Age of Onset  . Arthritis Mother   . Heart disease Mother   . Arthritis Father   . Alcohol abuse Father   . Cancer Brother        lung cancer.  Died in early 46s.  . Alcohol abuse Son   . Drug abuse Son   . Early death Brother        trauma  . Drug abuse Brother   . Colon cancer Neg Hx     Social History Social History   Tobacco Use  . Smoking status: Current Every Day Smoker    Packs/day: 1.00    Years: 30.00    Pack years: 30.00    Types: Cigarettes    Start date: 09/02/1966  . Smokeless tobacco: Never Used  Substance Use Topics  . Alcohol use: No    Comment: former etoh  abuse, last was 6 months ago  . Drug use: No     Allergies   Depakote [divalproex sodium] and Codeine   Review of Systems Review of Systems  Constitutional: Positive for fever.  HENT: Negative for congestion and sore throat.   Eyes: Negative.   Respiratory: Negative for chest tightness and shortness of breath.   Cardiovascular: Negative for chest pain.  Gastrointestinal: Positive for abdominal pain, nausea and vomiting.  Genitourinary: Positive for dysuria. Negative for flank pain, hematuria and urgency.  Musculoskeletal: Positive for arthralgias. Negative for joint swelling and neck pain.  Skin: Negative.  Negative for rash and wound.  Neurological: Negative for dizziness, weakness, light-headedness, numbness and headaches.  Psychiatric/Behavioral: Negative.      Physical Exam Updated Vital Signs BP (!) 139/58 (BP Location: Right Arm)   Pulse (!) 54   Temp 97.7 F (36.5 C) (Oral)   Resp 14   Ht 6' (1.829 m)   Wt 81.6 kg (180 lb)   SpO2 100%   BMI 24.41 kg/m   Physical Exam  Constitutional: He appears well-developed and well-nourished.  HENT:  Head: Normocephalic and atraumatic.  Eyes: Conjunctivae are normal.  Neck: Normal range of motion.  Cardiovascular: Normal rate, regular rhythm, normal heart sounds and intact distal pulses.  Pulmonary/Chest: Effort normal and breath sounds normal. He has no wheezes.  Abdominal: Soft. Bowel sounds are normal. He exhibits no distension and no mass. There is no hepatosplenomegaly. There is tenderness in the suprapubic area. There is no rebound, no guarding, no CVA tenderness, no tenderness at McBurney's point and negative Murphy's sign.  Extensive surgical incisions of the lower abdomen, well-healed.  Musculoskeletal: Normal range of motion.  Neurological: He is alert.  Skin: Skin is warm and dry.  Psychiatric: He has a normal mood and affect.  Nursing note and vitals reviewed.    ED Treatments / Results  Labs (all labs  ordered are listed, but only abnormal results are displayed) Labs Reviewed  COMPREHENSIVE METABOLIC PANEL - Abnormal; Notable for the following components:      Result Value  ALT 7 (*)    All other components within normal limits  CBC WITH DIFFERENTIAL/PLATELET - Abnormal; Notable for the following components:   WBC 3.6 (*)    Lymphs Abs 0.6 (*)    All other components within normal limits  LIPASE, BLOOD  URINALYSIS, ROUTINE W REFLEX MICROSCOPIC    EKG  EKG Interpretation None       Radiology Ct Abdomen Pelvis W Contrast  Result Date: 10/15/2017 CLINICAL DATA:  Left flank pain ongoing for several weeks. Polyuria and nausea. EXAM: CT ABDOMEN AND PELVIS WITH CONTRAST TECHNIQUE: Multidetector CT imaging of the abdomen and pelvis was performed using the standard protocol following bolus administration of intravenous contrast. CONTRAST:  162mL ISOVUE-300 IOPAMIDOL (ISOVUE-300) INJECTION 61% COMPARISON:  04/18/2017 FINDINGS: Lower chest: The included heart is normal in size. No pericardial effusion is identified. Subsegmental atelectasis and/or scarring in the lingula with bibasilar dependent atelectasis is noted. Hepatobiliary: 8 mm hypodensity statistically more likely to represent a cyst or hemangioma is noted in the left hepatic lobe. Biliary sludge and calculi are noted within the nondistended gallbladder. No biliary dilatation. Pancreas: Atrophic pancreas. No inflammation or mass. Ectasia of the pancreatic duct. Spleen: Normal Adrenals/Urinary Tract: Normal bilateral adrenal glands. Water attenuating cysts noted of the kidneys bilaterally. No nephrolithiasis nor obstructive uropathy. No enhancing renal masses. Cortical thinning in the lower pole the left kidney. The urinary bladder is physiologically distended without focal mural thickening or calculus. Stomach/Bowel: Small hiatal hernia. Decompressed stomach. Normal small bowel rotation without obstruction or inflammation. Moderate degree  of colonic diverticulosis along the descending and sigmoid colon without acute diverticulitis. Vascular/Lymphatic: Aortic atherosclerosis. No enlarged abdominal or pelvic lymph nodes. Reproductive: Prostate is unremarkable. Other: No abdominal wall hernia or abnormality. No abdominopelvic ascites. Musculoskeletal: Chronic T12 compression fracture unchanged in appearance. Lower lumbar facet arthropathy. No acute nor suspicious osseous abnormality. IMPRESSION: 1. Redemonstration of descending and sigmoid colonic diverticulosis without acute diverticulitis. 2. Contracted gallbladder containing biliary sludge and nonobstructing stones. 3. Bilateral renal cysts with left-sided renal cortical thinning. No obstructive uropathy. 4. 8 mm hypodensity in the left hepatic lobe compatible with a cyst or hemangioma. Electronically Signed   By: Ashley Royalty M.D.   On: 10/15/2017 00:42    Procedures Procedures (including critical care time)  Medications Ordered in ED Medications  sodium chloride 0.9 % bolus 1,000 mL (1,000 mLs Intravenous New Bag/Given 10/14/17 2334)  ondansetron (ZOFRAN) injection 4 mg (4 mg Intravenous Given 10/14/17 2334)  iopamidol (ISOVUE-300) 61 % injection 100 mL (100 mLs Intravenous Contrast Given 10/14/17 2347)     Initial Impression / Assessment and Plan / ED Course  I have reviewed the triage vital signs and the nursing notes.  Pertinent labs & imaging results that were available during my care of the patient were reviewed by me and considered in my medical decision making (see chart for details).     Labs and CT imaging reviewed and discussed with pt. No significant findings which would correlate with patient's symptoms.  At reexam, patient still has no guarding, benign abdomen.  He does have gallstones but no sign of cholecystitis.  He has no pain in his right upper quadrant to suggest his symptoms are biliary.  Patient has no diverticulitis at this time, no bowel obstruction.  He was  prescribed Zofran.  He had no vomiting and no diarrhea while in the department.  He did have frequent need of urination.  UA is negative for infection and his kidney function appears normal.  He was referred to GI for further evaluation of his symptoms. Pt seen by Dr. Roxanne Mins prior to dc.  Final Clinical Impressions(s) / ED Diagnoses   Final diagnoses:  Lower abdominal pain  Non-intractable vomiting with nausea, unspecified vomiting type  Diarrhea, unspecified type    ED Discharge Orders        Ordered    ondansetron (ZOFRAN ODT) 4 MG disintegrating tablet  Every 8 hours PRN     10/15/17 0203       Evalee Jefferson, PA-C 62/83/15 1761    Delora Fuel, MD 60/73/71 0700

## 2017-10-15 NOTE — Discharge Instructions (Signed)
Your labs and CT scans are stable with no findings suggesting the source of your symptoms. Call Dr Oneida Alar for further evaluation of your symptoms.  You may use the medicine prescribed for your symptoms.  You have also been referred to Dr Aline Brochure for evaluation of your knee complaint.

## 2017-11-10 ENCOUNTER — Ambulatory Visit (INDEPENDENT_AMBULATORY_CARE_PROVIDER_SITE_OTHER): Payer: Medicare Other | Admitting: Family Medicine

## 2017-11-10 ENCOUNTER — Encounter: Payer: Self-pay | Admitting: Family Medicine

## 2017-11-10 VITALS — BP 124/74 | HR 77 | Temp 97.0°F | Ht 72.0 in | Wt 148.8 lb

## 2017-11-10 DIAGNOSIS — R634 Abnormal weight loss: Secondary | ICD-10-CM | POA: Diagnosis not present

## 2017-11-10 DIAGNOSIS — M25562 Pain in left knee: Secondary | ICD-10-CM | POA: Diagnosis not present

## 2017-11-10 DIAGNOSIS — R42 Dizziness and giddiness: Secondary | ICD-10-CM

## 2017-11-10 DIAGNOSIS — G8929 Other chronic pain: Secondary | ICD-10-CM

## 2017-11-10 DIAGNOSIS — R591 Generalized enlarged lymph nodes: Secondary | ICD-10-CM | POA: Diagnosis not present

## 2017-11-10 MED ORDER — PANTOPRAZOLE SODIUM 20 MG PO TBEC: 20.0000 mg | DELAYED_RELEASE_TABLET | Freq: Two times a day (BID) | ORAL | 3 refills | Status: DC

## 2017-11-10 MED ORDER — THERAGRAN-M PO TABS: 1.0000 | ORAL_TABLET | Freq: Every day | ORAL | 3 refills | Status: DC

## 2017-11-10 MED ORDER — MECLIZINE HCL 12.5 MG PO TABS: 12.5000 mg | ORAL_TABLET | Freq: Three times a day (TID) | ORAL | 3 refills | Status: DC | PRN

## 2017-11-10 MED ORDER — ONDANSETRON 4 MG PO TBDP: 4.0000 mg | ORAL_TABLET | Freq: Three times a day (TID) | ORAL | 0 refills | Status: DC | PRN

## 2017-11-10 MED ORDER — TAMSULOSIN HCL 0.4 MG PO CAPS: 0.4000 mg | ORAL_CAPSULE | Freq: Every day | ORAL | 3 refills | Status: DC

## 2017-11-10 NOTE — Progress Notes (Signed)
Chief Complaint  Patient presents with  . Abdominal Pain    under abdominal pain under hernia yesterday    Patient is brought in by his social worker for a follow-up visit. He called her yesterday because he was having abdominal pain.  When she visited him this morning he admitted to her that he was out of all of his medications.  I have not seen him since October.  When his medicines were delivered to him, he did not think to call for refills. He lives alone.  Recovered addict.  History of mental illness.  Suspected self-neglect with weight loss nutritional deficiency.  He does not leave his house much.  He initially went to [place] but has quit going.  Very little social contact.  No contact with his children. He is here today and is lost another 10 pounds.  This is discussed with the Education officer, museum.  She states she took him to the store today and they have a "trunk full groceries".  He has not yet eaten today. He continues to smoke cigarettes.  Feels unable to quit.  We discussed quitting, but he tells me he is "not ready". Patient complains of left knee pain.  He has had multiple surgeries on this left knee.  He states it feels like it is going to give out.  I will order an x-ray of his knee, we will refer back to orthopedic surgery. He tells me that he had abdominal pain yesterday.  He had crampy abdominal pain in the lower abdomen.  It was present for couple of hours and then eased off.  He had some nausea.  No vomiting, no diarrhea.  He points to his general lower abdominal region as the area of pain. He denies any alcohol.  He does still smoke.  He denies any drug use. On review of systems he tells me he is having increased urinary frequency.  Nocturia.  No trouble starting or stopping the stream.  No blood in the urine or dysuria.  No prostate cancer in his family.   Patient Active Problem List   Diagnosis Date Noted  . Rectal bleeding   . Upper GI bleed 04/17/2017  . Vertigo 04/17/2017    . Upper GI bleeding 04/17/2017  . Coffee ground emesis   . Abdominal pain   . RBBB 02/21/2017  . Osteopenia determined by x-ray 02/21/2017  . Vitamin B 12 deficiency 02/21/2017  . History of smoking 30 or more pack years 02/21/2017  . Vitamin D deficiency 02/21/2017  . Debilitated 11/29/2015  . Paroxysmal atrial fibrillation (HCC)   . Acute on chronic combined systolic and diastolic CHF (congestive heart failure) ([place])   . Cardiomyopathy, ischemic 11/19/2015  . Cognitive dysfunction, alcohol-related ([place]) 01/03/2014  . Periodontal disease 12/24/2013  . Alcohol dependence ([place]) 04/12/2013  . Bipolar I disorder, most recent episode depressed ([place]) 04/12/2013  . H/O: substance abuse 08/06/2012  . Tobacco abuse 08/06/2012  . History of hernia repair 08/06/2012  . Prostate cancer screening 09/19/2011  . History of depression 08/06/2011    Outpatient Encounter Medications as of 11/10/2017  Medication Sig  . acetaminophen (TYLENOL) 650 MG CR tablet Take 2 tablets (1,300 mg total) by mouth every 8 (eight) hours as needed for pain.  . Artificial Tear Ointment (DRY EYES OP) Apply 1 drop to eye daily as needed (dry eyes).  Marland Kitchen aspirin EC 81 MG tablet Take 1 tablet (81 mg total) by mouth daily.  . Emollient (VASELINE INTENSIVE CARE  EX) Apply 1 application topically daily as needed. For bruise on left hand and both arms.  . meclizine (ANTIVERT) 12.5 MG tablet Take 1 tablet (12.5 mg total) by mouth 3 (three) times daily as needed for dizziness.  . multivitamin-iron-minerals-folic acid (THERAPEUTIC-M) TABS tablet Take 1 tablet by mouth daily.  . ondansetron (ZOFRAN ODT) 4 MG disintegrating tablet Take 1 tablet (4 mg total) by mouth every 8 (eight) hours as needed for nausea or vomiting.  . pantoprazole (PROTONIX) 20 MG tablet Take 1 tablet (20 mg total) by mouth 2 (two) times daily.  . potassium chloride SA (K-DUR,KLOR-CON) 20 MEQ tablet Take 1 tablet (20 mEq total) by mouth once.  . tamsulosin  (FLOMAX) 0.4 MG CAPS capsule Take 1 capsule (0.4 mg total) by mouth daily.   No facility-administered encounter medications on file as of 11/10/2017.     Allergies  Allergen Reactions  . Depakote [Divalproex Sodium] Other (See Comments)    "Makes me crazy"  . Codeine Nausea Only and Other (See Comments)    flushing    Review of Systems  Constitutional: Positive for appetite change, fatigue and unexpected weight change. Negative for activity change.       Ongoing  HENT: Negative for congestion and dental problem.   Eyes: Negative for photophobia and visual disturbance.  Respiratory: Positive for shortness of breath. Negative for cough.        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  Gastrointestinal: Negative for blood in stool and constipation.  Genitourinary: Positive for frequency. Negative for difficulty urinating, dysuria and flank pain.  Musculoskeletal: Positive for gait problem. Negative for arthralgias and back pain.       Falls and instability  Neurological: Positive for dizziness. Negative for light-headedness.       Improved on meclizine  Psychiatric/Behavioral: Negative for decreased concentration and sleep disturbance. The patient is not nervous/anxious.       BP 124/74 (BP Location: Right Arm, Patient Position: Sitting, Cuff Size: Normal)   Pulse 77   Temp (!) 97 F (36.1 C) (Temporal)   Ht 6' (1.829 m)   Wt 148 lb 12 oz (67.5 kg)   SpO2 97%   BMI 20.17 kg/m   Physical Exam  Constitutional: He is oriented to person, place, and time. He appears well-developed. He appears ill.  Elderly.  Thin.  Little facial expression.  Poor eye contact.  HENT:  Head: Normocephalic and atraumatic.  Mouth/Throat: Oropharynx is clear and moist.  Many teeth fractured at the gumline.  Oropharynx with no obvious disease or lesion. 2 cm very firm mobile lymph node palpable at the right angle of the jaw.  Patient states has been  present unchanged for a while.  Nontender.  Eyes: Pupils are equal, round, and reactive to light. No scleral icterus.  Cardiovascular: Normal rate, regular rhythm and normal heart sounds.  Pulmonary/Chest: Effort normal and breath sounds normal.  Decreased breath sounds.  No rales or rhonchi  Abdominal: Soft. Normal appearance and bowel sounds are normal. He exhibits no distension. There is no hepatosplenomegaly. There is no tenderness. There is no rebound and no guarding.  Neurological: He is alert and oriented to person, place, and time.  Skin: Skin is warm and dry.  Psychiatric:  Slight blunted affect.  Cooperative.    ASSESSMENT/PLAN:  1. Vertigo Chronic complaint.  Uses as needed meclizine  2. Weight loss Ongoing over time.  Patient has poor social situation, does not drive, and  relies on others to take him to the store.  Decreased appetite.  3. Chronic pain of left knee Multiple surgeries.  Will get x-ray and referral  4. Lymphadenopathy of head and neck Incidental finding on examination.  It does not feel like an inflammatory node from infection.  Worried because of his chronic smoking and weight loss.  Will refer for biopsy - Ambulatory referral to ENT    Patient Instructions  I have referred you to Dr. Benjamine Mola for the lymph node in your neck We will make an appointment for you  I have refilled your pantoprazole.  Take once a day for stomach pain I have refilled your Zofran to take as needed for nausea I refilled the vitamin to take once a day I refilled the meclizine free to take as needed for dizziness I also prescribed tamsulosin, medicine to take once a day for your prostate This will work in about 4-6 weeks.  Continue to take it once a day, results are slow  Walk every day that you are able. I have ordered an x-ray for your knee. You need to see an orthopedic knee specialist.  Make sure that you eat a healthy diet. Need follow-up in 3-4 months.  We will contact  you with appointment   Raylene Everts, MD

## 2017-11-10 NOTE — Patient Instructions (Addendum)
I have referred you to Dr. Benjamine Mola for the lymph node in your neck We will make an appointment for you  I have refilled your pantoprazole.  Take once a day for stomach pain I have refilled your Zofran to take as needed for nausea I refilled the vitamin to take once a day I refilled the meclizine free to take as needed for dizziness I also prescribed tamsulosin, medicine to take once a day for your prostate This will work in about 4-6 weeks.  Continue to take it once a day, results are slow  Walk every day that you are able. I have ordered an x-ray for your knee. You need to see an orthopedic knee specialist.  Make sure that you eat a healthy diet. Need follow-up in 3-4 months.  We will contact you with appointment

## 2017-11-11 ENCOUNTER — Other Ambulatory Visit: Payer: Self-pay

## 2017-11-11 MED ORDER — MECLIZINE HCL 12.5 MG PO TABS: 12.5000 mg | ORAL_TABLET | Freq: Three times a day (TID) | ORAL | 3 refills | Status: DC | PRN

## 2017-11-11 MED ORDER — ONDANSETRON 4 MG PO TBDP: 4.0000 mg | ORAL_TABLET | Freq: Three times a day (TID) | ORAL | 0 refills | Status: DC | PRN

## 2017-11-20 ENCOUNTER — Ambulatory Visit (INDEPENDENT_AMBULATORY_CARE_PROVIDER_SITE_OTHER): Payer: Medicare Other | Admitting: Orthopaedic Surgery

## 2017-12-15 ENCOUNTER — Ambulatory Visit (INDEPENDENT_AMBULATORY_CARE_PROVIDER_SITE_OTHER): Payer: Medicare Other | Admitting: Otolaryngology

## 2017-12-15 ENCOUNTER — Other Ambulatory Visit (INDEPENDENT_AMBULATORY_CARE_PROVIDER_SITE_OTHER): Payer: Self-pay | Admitting: Otolaryngology

## 2017-12-15 DIAGNOSIS — R221 Localized swelling, mass and lump, neck: Secondary | ICD-10-CM

## 2017-12-15 DIAGNOSIS — D487 Neoplasm of uncertain behavior of other specified sites: Secondary | ICD-10-CM | POA: Diagnosis not present

## 2017-12-15 DIAGNOSIS — R070 Pain in throat: Secondary | ICD-10-CM

## 2017-12-15 DIAGNOSIS — R590 Localized enlarged lymph nodes: Secondary | ICD-10-CM

## 2017-12-24 ENCOUNTER — Ambulatory Visit (HOSPITAL_COMMUNITY)
Admission: RE | Admit: 2017-12-24 | Discharge: 2017-12-24 | Disposition: A | Discharge status: Home / Self Care | Payer: Medicare Other | Source: Ambulatory Visit | Attending: Otolaryngology | Admitting: Otolaryngology

## 2017-12-24 DIAGNOSIS — R221 Localized swelling, mass and lump, neck: Secondary | ICD-10-CM | POA: Diagnosis present

## 2017-12-24 DIAGNOSIS — R911 Solitary pulmonary nodule: Secondary | ICD-10-CM | POA: Diagnosis not present

## 2017-12-24 LAB — POCT I-STAT CREATININE: Creatinine, Ser: 1.1 mg/dL (ref 0.61–1.24)

## 2017-12-24 MED ORDER — IOPAMIDOL (ISOVUE-300) INJECTION 61%
100.0000 mL | Freq: Once | INTRAVENOUS | Status: AC | PRN
  Administered 2017-12-24: 75 mL via INTRAVENOUS

## 2017-12-29 ENCOUNTER — Ambulatory Visit (INDEPENDENT_AMBULATORY_CARE_PROVIDER_SITE_OTHER): Payer: Medicare Other | Admitting: Otolaryngology

## 2017-12-29 DIAGNOSIS — D3705 Neoplasm of uncertain behavior of pharynx: Secondary | ICD-10-CM

## 2017-12-29 DIAGNOSIS — D487 Neoplasm of uncertain behavior of other specified sites: Secondary | ICD-10-CM | POA: Diagnosis not present

## 2017-12-31 ENCOUNTER — Other Ambulatory Visit: Payer: Self-pay | Admitting: Otolaryngology

## 2018-01-01 ENCOUNTER — Encounter (HOSPITAL_COMMUNITY): Payer: Self-pay | Admitting: *Deleted

## 2018-01-01 ENCOUNTER — Other Ambulatory Visit: Payer: Self-pay

## 2018-01-01 NOTE — Progress Notes (Signed)
Phone call received from Dr Benjamine Mola. MD states pt does not need to stay overnight from the biopsy standpoint. He understands that the patient does not have anyone to stay with him at home overnight. Dr Benjamine Mola stated he would write the order to be discharged Sat am. Will need social worker to transport home Saturday am

## 2018-01-01 NOTE — Progress Notes (Signed)
Anesthesia Chart Review:  Pt is a same day work up   Case:  [id number] Date/Time:  01/02/18 1415   Procedures:      DIRECT LARYNGOSCOPY (Right )     TONGUE BIOPSY WITH FROZEN SECTION (Right )     POSSIBLE NECK MASS BIOPSY (Right )   Anesthesia type:  General   Pre-op diagnosis:  RIGHT NECK MASS   Location:  [place] OR ROOM 09 / [facility]   Surgeon:  Leta Baptist, MD      DISCUSSION:  - Pt is a 67 year old male with a poor social situation, lives alone, malnutrition, not interested in self-care/eating.  - During hospitalization 11/2015 for septic shock due to ? small bowel ischemia s/p bowel resection and anastomosis, experienced acute hypoxic respiratory failure requiring intubation, complete heart block with runs of atrial fibrillation requiring temporary pacemaker, and acute on chronic CHF (EF 45%). Has not had cardiology f/u.  No evidence of acute CHF in documentation of PCP office visit 11/10/17.   - Pt will need further evaluation by assigned anesthesiologist day of surgery.    PROVIDERS: Raylene Everts, MD   LABS: Will be obtained day of surgery   IMAGES:  CXR 01/08/17: No active cardiopulmonary disease   EKG 06/18/17: Sinus arrhythmia. RBBB   CV:  Echo 11/18/15 (done for septic shock secondary to ischemic bowel):  - Procedure narrative: Transthoracic echocardiography. Image quality was adequate. The study was technically difficult, as a result of poor acoustic windows and restricted patient mobility.  - Left ventricle: The cavity size was normal. Wall thickness was increased in a pattern of mild LVH. Systolic function was mildly to moderately reduced. The estimated ejection fraction was in the range of 40% to 45%. Severe inferolateral hypokinesis to akinesis. The study is not technically sufficient to allow evaluation of LV diastolic function. - Mitral valve: Calcified annulus. - Left atrium: The atrium was mildly dilated. - Right ventricle: The cavity size was mildly dilated.  Systolic function is reduced. - Systemic veins: IVC is dilated - however, patient is on positive pressure ventilation. - Impressions: Very technically diffult study as patient is immobile on the ventilator. LVEF is reduced to 40-45% with inferior and lateral severe hypokinesis to akinesis. The IVC is dilated in the setting of positive pressure ventilation. The RV is mildly dilated with reduced systolic function. There is no pericrdial effusion.    Past Medical History:  Diagnosis Date  . Alcoholism ([place])    "Dry" since 2002; relapse 2014  . Allergy   . Anemia   . Anxiety   . Anxiety and depression   . Bipolar disorder [facility])    Has had multiple psych hospitalization in the past, most recently at [place] [facility] 01/31/11-02/04/11.  Says meds made him emotionally blunted. (lamictal and wellbutrin).  . Cancer ([place])    skin  . CHF (congestive heart failure) ([place])   . Chronic kidney disease   . Depression   . Diverticular disease    "itis" x 2 episodes (last was about 2007)  . Fracture of metatarsal bone(s), closed    3rd, 4th, 5th mid shaft on left foot  . History of septic shock 11/2015   ? due to small bowel ischemia: hospitalized + surgeries 11/2015  . History of substance abuse    cocaine, marijuana, acid, alcohol (in remission since 2010)  . Mass    right neck  . Multiple rib fractures 10/09/10   s/p fall down a ravine--9th and 10th  on right, contusion of left 7th and 8th ribs  . Osteoarthritis X years   left knee.  Distant hx (age 66) "dislocated" knee and required surgery, then crush injury to patella required knee cap removal.  . Osteoporosis   . Solar dermatitis    face and ears  . Substance abuse ([place])   . Tobacco dependence   . Vitamin B12 deficiency 10/2013   IF ab positive.  Replacement therapy started end of March 2015    Past Surgical History:  Procedure Laterality Date  . BOWEL RESECTION  06/15/2012   Procedure: SMALL BOWEL RESECTION;  Surgeon: Edward Jolly, MD;  Location: [facility];  Service: General;  Laterality: N/A;  . BOWEL RESECTION  11/19/2015   Procedure: SMALL BOWEL RESECTION;  Surgeon: Ralene Ok, MD;  Location: [place];  Service: General;;  . CARDIAC CATHETERIZATION N/A 11/20/2015   Procedure: Temporary Wire;  Surgeon: Evans Lance, MD;  Location: [place] CV LAB;  Service: Cardiovascular;  Laterality: N/A;  . ESOPHAGOGASTRODUODENOSCOPY (EGD) WITH PROPOFOL N/A 04/17/2017   Procedure: ESOPHAGOGASTRODUODENOSCOPY (EGD) WITH PROPOFOL;  Surgeon: Daneil Dolin, MD;  [facility];  Service: Endoscopy;  Laterality: N/A;  . FEMUR IM NAIL  08/06/2012   Procedure: INTRAMEDULLARY (IM) NAIL FEMORAL;  Surgeon: Johnn Hai, MD;  Location: [facility];  Service: Orthopedics;  Laterality: Left;  . HERNIA REPAIR     at 67 years of age  64  . INGUINAL HERNIA REPAIR     left  . INGUINAL HERNIA REPAIR  06/15/2012   Procedure: HERNIA REPAIR INGUINAL INCARCERATED;  Surgeon: Edward Jolly, MD;  Location: [facility];  Service: General;  Laterality: Right;  . JOINT REPLACEMENT    . KNEE DISLOCATION SURGERY  1966  . LAPAROTOMY N/A 11/17/2015   Procedure: EXPLORATORY LAPAROTOMY;  Surgeon: Donnie Mesa, MD;  Location: [place];  Service: General;  Laterality: N/A;  . LAPAROTOMY N/A 11/19/2015   Procedure: EXPLORATORY LAPAROTOMY OPEN ABDOMEN ABDOMINAL WOUND VAC CHANGE;  Surgeon: Ralene Ok, MD;  Location: [place];  Service: General;  Laterality: N/A;  . [place]   s/p crush injury  . SMALL BOWEL REPAIR N/A 11/21/2015   Procedure: SMALL BOWEL REPAIR, SMALL BOWEL ANATAMOSIS AND ABDOMINAL CLOSURE;  Surgeon: Coralie Keens, MD;  Location: [place];  Service: General;  Laterality: N/A;  . TOTAL KNEE ARTHROPLASTY  11/20/2011   Procedure: TOTAL KNEE ARTHROPLASTY;  Surgeon: Ninetta Lights, MD;  Location: [facility];  Service: Orthopedics;  Laterality: Left;  DR Percell Miller WANTS 90MINUTES FOR THIS CASE    MEDICATIONS: No current  facility-administered medications for this encounter.    Marland Kitchen acetaminophen (TYLENOL) 500 MG tablet  . aspirin EC 81 MG tablet  . meclizine (ANTIVERT) 12.5 MG tablet  . multivitamin-iron-minerals-folic acid (THERAPEUTIC-M) TABS tablet  . ondansetron (ZOFRAN ODT) 4 MG disintegrating tablet  . pantoprazole (PROTONIX) 20 MG tablet  . potassium chloride SA (K-DUR,KLOR-CON) 20 MEQ tablet  . tamsulosin (FLOMAX) 0.4 MG CAPS capsule    If no acute CV symptoms and labs acceptable day of surgery, I anticipate pt can proceed with surgery as scheduled.  Willeen Cass, FNP-BC [facility]/Anesthesiology Phone: [phone] 01/01/2018 2:09 PM

## 2018-01-01 NOTE — Progress Notes (Signed)
Spoke with Timmy, Surgical Coordinator, to make MD aware that pt lives alone, social worker will only transport pt to and from procedure and not stay with pt post-op.

## 2018-01-01 NOTE — Progress Notes (Signed)
SDW -pre-op call completed by pt. Pt denies SOB, chest pain, and being under the care of a cardiologist. Pt stated that a stress test was performed > 10 years ago. Pt requested that Claiborne Billings be given pre-op instructions. Claiborne Billings made aware to have pt stop taking vitamins, fish oil and herbal medications. Do not take any NSAIDs ie: Ibuprofen, Advil, Naproxen (Aveve), Motrin, BC and Goody Powder. Claiborne Billings verbalized understanding of all pre-op instructions. Claiborne Billings advised that I make Social Worker Reyne Dumas aware of new surgery date and time; lvm on Mr. Yow's answering machine with arrival date, time and location and that pt needed to be monitored for 24 hours post-op along with nurses's name, telephone number and phone number to short stay ( for an emergency on DOS).

## 2018-01-02 ENCOUNTER — Ambulatory Visit (HOSPITAL_COMMUNITY)
Admission: RE | Admit: 2018-01-02 | Discharge: 2018-01-03 | Disposition: A | Discharge status: Home / Self Care | Payer: Medicare Other | Source: Ambulatory Visit | Attending: Otolaryngology | Admitting: Otolaryngology

## 2018-01-02 ENCOUNTER — Ambulatory Visit (HOSPITAL_COMMUNITY): Payer: Medicare Other | Admitting: Emergency Medicine

## 2018-01-02 ENCOUNTER — Encounter (HOSPITAL_COMMUNITY)
Admission: RE | Disposition: A | Discharge status: Home / Self Care | Payer: Self-pay | Source: Ambulatory Visit | Attending: Otolaryngology

## 2018-01-02 ENCOUNTER — Encounter (HOSPITAL_COMMUNITY): Payer: Self-pay | Admitting: *Deleted

## 2018-01-02 DIAGNOSIS — R221 Localized swelling, mass and lump, neck: Secondary | ICD-10-CM | POA: Diagnosis present

## 2018-01-02 DIAGNOSIS — C760 Malignant neoplasm of head, face and neck: Secondary | ICD-10-CM | POA: Insufficient documentation

## 2018-01-02 DIAGNOSIS — D631 Anemia in chronic kidney disease: Secondary | ICD-10-CM | POA: Insufficient documentation

## 2018-01-02 DIAGNOSIS — D3705 Neoplasm of uncertain behavior of pharynx: Secondary | ICD-10-CM | POA: Diagnosis not present

## 2018-01-02 DIAGNOSIS — Z980 Intestinal bypass and anastomosis status: Secondary | ICD-10-CM | POA: Insufficient documentation

## 2018-01-02 DIAGNOSIS — Z7982 Long term (current) use of aspirin: Secondary | ICD-10-CM | POA: Diagnosis not present

## 2018-01-02 DIAGNOSIS — N189 Chronic kidney disease, unspecified: Secondary | ICD-10-CM | POA: Insufficient documentation

## 2018-01-02 DIAGNOSIS — D487 Neoplasm of uncertain behavior of other specified sites: Secondary | ICD-10-CM | POA: Diagnosis not present

## 2018-01-02 DIAGNOSIS — Z85828 Personal history of other malignant neoplasm of skin: Secondary | ICD-10-CM | POA: Insufficient documentation

## 2018-01-02 DIAGNOSIS — I509 Heart failure, unspecified: Secondary | ICD-10-CM | POA: Diagnosis not present

## 2018-01-02 DIAGNOSIS — Z96652 Presence of left artificial knee joint: Secondary | ICD-10-CM | POA: Insufficient documentation

## 2018-01-02 DIAGNOSIS — K140 Glossitis: Secondary | ICD-10-CM | POA: Insufficient documentation

## 2018-01-02 DIAGNOSIS — J312 Chronic pharyngitis: Secondary | ICD-10-CM | POA: Diagnosis not present

## 2018-01-02 DIAGNOSIS — Z79899 Other long term (current) drug therapy: Secondary | ICD-10-CM | POA: Insufficient documentation

## 2018-01-02 HISTORY — PX: MASS BIOPSY: SHX5445

## 2018-01-02 HISTORY — DX: Reserved for concepts with insufficient information to code with codable children: IMO0002

## 2018-01-02 HISTORY — PX: DIRECT LARYNGOSCOPY: SHX5326

## 2018-01-02 HISTORY — PX: TONGUE BIOPSY: SHX6575

## 2018-01-02 HISTORY — DX: Localized swelling, mass and lump, unspecified: R22.9

## 2018-01-02 LAB — CBC
HEMATOCRIT: 42.7 % (ref 39.0–52.0)
HEMOGLOBIN: 14.2 g/dL (ref 13.0–17.0)
MCH: 31.1 pg (ref 26.0–34.0)
MCHC: 33.3 g/dL (ref 30.0–36.0)
MCV: 93.4 fL (ref 78.0–100.0)
Platelets: 178 10*3/uL (ref 150–400)
RBC: 4.57 MIL/uL (ref 4.22–5.81)
RDW: 15.4 % (ref 11.5–15.5)
WBC: 3.7 10*3/uL — ABNORMAL LOW (ref 4.0–10.5)

## 2018-01-02 LAB — BASIC METABOLIC PANEL
Anion gap: 10 (ref 5–15)
BUN: 12 mg/dL (ref 6–20)
CHLORIDE: 106 mmol/L (ref 101–111)
CO2: 24 mmol/L (ref 22–32)
CREATININE: 0.9 mg/dL (ref 0.61–1.24)
Calcium: 9.1 mg/dL (ref 8.9–10.3)
GFR calc Af Amer: >60 mL/min (ref >60)
GFR calc non Af Amer: >60 mL/min (ref >60)
Glucose, Bld: 88 mg/dL (ref 65–99)
Potassium: 3.9 mmol/L (ref 3.5–5.1)
Sodium: 140 mmol/L (ref 135–145)

## 2018-01-02 SURGERY — LARYNGOSCOPY, DIRECT
Anesthesia: General | Laterality: Right

## 2018-01-02 MED ORDER — EPINEPHRINE PF 1 MG/ML IJ SOLN
INTRAMUSCULAR | Status: DC | PRN
  Administered 2018-01-02: 1 mg

## 2018-01-02 MED ORDER — MIDAZOLAM HCL 5 MG/5ML IJ SOLN
INTRAMUSCULAR | Status: DC | PRN
  Administered 2018-01-02: 2 mg via INTRAVENOUS

## 2018-01-02 MED ORDER — PHENYLEPHRINE HCL 10 MG/ML IJ SOLN
INTRAMUSCULAR | Status: DC | PRN
  Administered 2018-01-02: 25 ug/min via INTRAVENOUS

## 2018-01-02 MED ORDER — DEXAMETHASONE SODIUM PHOSPHATE 10 MG/ML IJ SOLN
INTRAMUSCULAR | Status: DC | PRN
  Administered 2018-01-02: 10 mg via INTRAVENOUS

## 2018-01-02 MED ORDER — PROPOFOL 10 MG/ML IV BOLUS
INTRAVENOUS | Status: AC
  Filled 2018-01-02: qty 40

## 2018-01-02 MED ORDER — ONDANSETRON HCL 4 MG/2ML IJ SOLN: INTRAMUSCULAR | Status: DC | PRN

## 2018-01-02 MED ORDER — LACTATED RINGERS IV SOLN
INTRAVENOUS | Status: DC | PRN
  Administered 2018-01-02 (×2): via INTRAVENOUS

## 2018-01-02 MED ORDER — LIDOCAINE-EPINEPHRINE 1 %-1:100000 IJ SOLN
INTRAMUSCULAR | Status: DC | PRN
  Administered 2018-01-02: 10 mL

## 2018-01-02 MED ORDER — ACETAMINOPHEN 500 MG PO TABS
1000.0000 mg | ORAL_TABLET | Freq: Four times a day (QID) | ORAL | Status: DC | PRN
Start: 2018-01-02 — End: 2018-01-03
  Administered 2018-01-03: 1000 mg via ORAL
  Filled 2018-01-02: qty 2

## 2018-01-02 MED ORDER — FENTANYL CITRATE (PF) 250 MCG/5ML IJ SOLN
INTRAMUSCULAR | Status: AC
  Filled 2018-01-02: qty 5

## 2018-01-02 MED ORDER — HEMOSTATIC AGENTS (NO CHARGE) OPTIME
TOPICAL | Status: DC | PRN
  Administered 2018-01-02: 1 via TOPICAL

## 2018-01-02 MED ORDER — OXYCODONE-ACETAMINOPHEN 5-325 MG PO TABS: 1.0000 | ORAL_TABLET | ORAL | Status: DC | PRN

## 2018-01-02 MED ORDER — EPHEDRINE 5 MG/ML INJ
INTRAVENOUS | Status: AC
  Filled 2018-01-02: qty 10

## 2018-01-02 MED ORDER — MIDAZOLAM HCL 2 MG/2ML IJ SOLN
INTRAMUSCULAR | Status: AC
  Filled 2018-01-02: qty 2

## 2018-01-02 MED ORDER — 0.9 % SODIUM CHLORIDE (POUR BTL) OPTIME
TOPICAL | Status: DC | PRN
  Administered 2018-01-02: 1000 mL

## 2018-01-02 MED ORDER — GLYCOPYRROLATE 0.2 MG/ML IV SOSY
PREFILLED_SYRINGE | INTRAVENOUS | Status: DC | PRN
  Administered 2018-01-02 (×2): .1 mg via INTRAVENOUS

## 2018-01-02 MED ORDER — LACTATED RINGERS IV SOLN
Freq: Once | INTRAVENOUS | Status: AC
  Administered 2018-01-02: 14:00:00 via INTRAVENOUS

## 2018-01-02 MED ORDER — SUGAMMADEX SODIUM 200 MG/2ML IV SOLN
INTRAVENOUS | Status: AC
  Filled 2018-01-02: qty 2

## 2018-01-02 MED ORDER — DEXAMETHASONE SODIUM PHOSPHATE 10 MG/ML IJ SOLN
INTRAMUSCULAR | Status: AC
  Filled 2018-01-02: qty 1

## 2018-01-02 MED ORDER — MORPHINE SULFATE (PF) 2 MG/ML IV SOLN
2.0000 mg | INTRAVENOUS | Status: DC | PRN
  Administered 2018-01-02: 2 mg via INTRAVENOUS
  Filled 2018-01-02: qty 1

## 2018-01-02 MED ORDER — ONDANSETRON HCL 4 MG PO TABS: 4.0000 mg | ORAL_TABLET | ORAL | Status: DC | PRN

## 2018-01-02 MED ORDER — EPINEPHRINE PF 1 MG/ML IJ SOLN
INTRAMUSCULAR | Status: AC
  Filled 2018-01-02: qty 1

## 2018-01-02 MED ORDER — ROCURONIUM BROMIDE 10 MG/ML (PF) SYRINGE
PREFILLED_SYRINGE | INTRAVENOUS | Status: DC | PRN
  Administered 2018-01-02: 50 mg via INTRAVENOUS

## 2018-01-02 MED ORDER — ONDANSETRON HCL 4 MG/2ML IJ SOLN: 4.0000 mg | INTRAMUSCULAR | Status: DC | PRN

## 2018-01-02 MED ORDER — OXYMETAZOLINE HCL 0.05 % NA SOLN
NASAL | Status: AC
  Filled 2018-01-02: qty 15

## 2018-01-02 MED ORDER — LIDOCAINE 2% (20 MG/ML) 5 ML SYRINGE
INTRAMUSCULAR | Status: DC | PRN
  Administered 2018-01-02: 20 mg via INTRAVENOUS

## 2018-01-02 MED ORDER — FENTANYL CITRATE (PF) 100 MCG/2ML IJ SOLN: 25.0000 ug | INTRAMUSCULAR | Status: DC | PRN

## 2018-01-02 MED ORDER — EPINEPHRINE HCL (NASAL) 0.1 % NA SOLN
NASAL | Status: AC
  Filled 2018-01-02: qty 30

## 2018-01-02 MED ORDER — LIDOCAINE-EPINEPHRINE 1 %-1:100000 IJ SOLN
INTRAMUSCULAR | Status: AC
  Filled 2018-01-02: qty 1

## 2018-01-02 MED ORDER — CEFAZOLIN SODIUM-DEXTROSE 2-3 GM-%(50ML) IV SOLR
INTRAVENOUS | Status: DC | PRN
  Administered 2018-01-02: 2 g via INTRAVENOUS

## 2018-01-02 MED ORDER — FENTANYL CITRATE (PF) 100 MCG/2ML IJ SOLN
INTRAMUSCULAR | Status: DC | PRN
  Administered 2018-01-02 (×2): 50 ug via INTRAVENOUS

## 2018-01-02 MED ORDER — SUGAMMADEX SODIUM 200 MG/2ML IV SOLN
INTRAVENOUS | Status: DC | PRN
  Administered 2018-01-02: 200 mg via INTRAVENOUS

## 2018-01-02 MED ORDER — PROPOFOL 10 MG/ML IV BOLUS
INTRAVENOUS | Status: DC | PRN
  Administered 2018-01-02: 150 mg via INTRAVENOUS
  Administered 2018-01-02: 50 mg via INTRAVENOUS

## 2018-01-02 MED ORDER — CEFAZOLIN SODIUM-DEXTROSE 2-4 GM/100ML-% IV SOLN
INTRAVENOUS | Status: AC
  Administered 2018-01-02: 15:00:00
  Filled 2018-01-02: qty 100

## 2018-01-02 MED ORDER — EPHEDRINE SULFATE-NACL 50-0.9 MG/10ML-% IV SOSY
PREFILLED_SYRINGE | INTRAVENOUS | Status: DC | PRN
  Administered 2018-01-02: 10 mg via INTRAVENOUS

## 2018-01-02 MED ORDER — ONDANSETRON HCL 4 MG/2ML IJ SOLN
INTRAMUSCULAR | Status: AC
Start: 2018-01-02 — End: ?
  Filled 2018-01-02: qty 2

## 2018-01-02 MED ORDER — ONDANSETRON HCL 4 MG/2ML IJ SOLN
INTRAMUSCULAR | Status: DC | PRN
  Administered 2018-01-02: 4 mg via INTRAVENOUS

## 2018-01-02 SURGICAL SUPPLY — 44 items
ADH SKN CLS APL DERMABOND .7 (GAUZE/BANDAGES/DRESSINGS) ×1
BLADE 10 SAFETY STRL DISP (BLADE) ×3 IMPLANT
BLADE SURG 15 STRL LF DISP TIS (BLADE) IMPLANT
BLADE SURG 15 STRL SS (BLADE)
CANISTER SUCT 3000ML PPV (MISCELLANEOUS) ×3 IMPLANT
CLEANER TIP ELECTROSURG 2X2 (MISCELLANEOUS) ×3 IMPLANT
CONT SPEC 4OZ CLIKSEAL STRL BL (MISCELLANEOUS) ×4 IMPLANT
COVER BACK TABLE 60X90IN (DRAPES) ×3 IMPLANT
COVER SURGICAL LIGHT HANDLE (MISCELLANEOUS) ×3 IMPLANT
DERMABOND ADVANCED (GAUZE/BANDAGES/DRESSINGS) ×2
DERMABOND ADVANCED .7 DNX12 (GAUZE/BANDAGES/DRESSINGS) ×1 IMPLANT
DRAPE HALF SHEET 40X57 (DRAPES) ×3 IMPLANT
ELECT COATED BLADE 2.86 ST (ELECTRODE) IMPLANT
ELECT NEEDLE BLADE 2-5/6 (NEEDLE) IMPLANT
ELECT REM PT RETURN 9FT ADLT (ELECTROSURGICAL) ×3
ELECTRODE REM PT RTRN 9FT ADLT (ELECTROSURGICAL) ×1 IMPLANT
GLOVE BIO SURGEON STRL SZ7.5 (GLOVE) ×3 IMPLANT
GLOVE ECLIPSE 7.5 STRL STRAW (GLOVE) ×3 IMPLANT
GOWN STRL REUS W/ TWL LRG LVL3 (GOWN DISPOSABLE) ×2 IMPLANT
GOWN STRL REUS W/TWL LRG LVL3 (GOWN DISPOSABLE) ×6
GUARD TEETH (MISCELLANEOUS) ×3 IMPLANT
HEMOSTAT SNOW SURGICEL 2X4 (HEMOSTASIS) ×3 IMPLANT
KIT BASIN OR (CUSTOM PROCEDURE TRAY) ×3 IMPLANT
KIT TURNOVER KIT B (KITS) ×3 IMPLANT
MARKER SKIN DUAL TIP RULER LAB (MISCELLANEOUS) ×2 IMPLANT
NEEDLE HYPO 25GX1X1/2 BEV (NEEDLE) IMPLANT
NS IRRIG 1000ML POUR BTL (IV SOLUTION) ×3 IMPLANT
PAD ARMBOARD 7.5X6 YLW CONV (MISCELLANEOUS) ×6 IMPLANT
PATTIES SURGICAL .5 X1 (DISPOSABLE) IMPLANT
PEN SKIN MARKING BROAD (MISCELLANEOUS) ×3 IMPLANT
PENCIL BUTTON HOLSTER BLD 10FT (ELECTRODE) ×3 IMPLANT
SOLUTION ANTI FOG 6CC (MISCELLANEOUS) IMPLANT
SPECIMEN JAR SMALL (MISCELLANEOUS) ×3 IMPLANT
SPONGE GAUZE 4X4 12PLY STER LF (GAUZE/BANDAGES/DRESSINGS) ×3 IMPLANT
SPONGE NEURO XRAY DETECT 1X3 (DISPOSABLE) ×2 IMPLANT
SUT SILK 2 0 (SUTURE)
SUT SILK 2-0 18XBRD TIE 12 (SUTURE) IMPLANT
SUT VIC AB 4-0 SH 27 (SUTURE) ×3
SUT VIC AB 4-0 SH 27XBRD (SUTURE) IMPLANT
TOWEL OR 17X24 6PK STRL BLUE (TOWEL DISPOSABLE) ×3 IMPLANT
TRAY ENT MC OR (CUSTOM PROCEDURE TRAY) ×3 IMPLANT
TUBE CONNECTING 12'X1/4 (SUCTIONS) ×1
TUBE CONNECTING 12X1/4 (SUCTIONS) ×2 IMPLANT
WATER STERILE IRR 1000ML POUR (IV SOLUTION) ×3 IMPLANT

## 2018-01-02 NOTE — Anesthesia Procedure Notes (Signed)
Procedure Name: Intubation Date/Time: 01/02/2018 3:03 PM Performed by: Colin Benton, CRNA Pre-anesthesia Checklist: Patient identified, Emergency Drugs available, Suction available and Patient being monitored Patient Re-evaluated:Patient Re-evaluated prior to induction Oxygen Delivery Method: Circle system utilized Preoxygenation: Pre-oxygenation with 100% oxygen Induction Type: IV induction Ventilation: Mask ventilation without difficulty Laryngoscope Size: Miller and 2 Grade View: Grade I Tube type: Oral Tube size: 7.0 mm Number of attempts: 1 Placement Confirmation: ETT inserted through vocal cords under direct vision,  positive ETCO2 and breath sounds checked- equal and bilateral Secured at: 23 cm Tube secured with: Tape Dental Injury: Teeth and Oropharynx as per pre-operative assessment

## 2018-01-02 NOTE — Discharge Instructions (Signed)
The patient may resume all his previous activities and diet. He will follow-up in my [place] office in one week.

## 2018-01-02 NOTE — H&P (Signed)
Cc: Right neck mass  HPI:  The patient is a 67 year old male who returns today for his follow-up evaluation.  The patient was last seen 2 weeks ago.  At that time, he was noted to have a large 5 cm right neck mass.  The finding was concerning for malignancy.  No obvious suspicious mass or lesion was noted on his fiberoptic laryngoscopy examination.  The patient subsequently underwent a neck CT scan.  The CT showed a necrotic mass at the right Level II lymph node, measuring 4 cm in diameter.  The mass involved the right carotid space, occluded the transverse internal jugular vein, deep lobe of the parotid gland, and sternocleidomastoid muscles.  In addition, there were several other necrotic lymph nodes within right Level III and V.  The CT also showed asymmetric irregular mucosal thickening involving the right base of tongue, lateral and posterolateral wall of the oropharynx, and extending along the tonsillar pillar to the right posterior aspect of the soft palate.  The patient returns today reporting persistent right neck discomfort.  The size of the mass has slightly increased.  The patient is a 20+ pack year smoker.    Exam: The nasal cavities were decongested and anesthetised with a combination of oxymetazoline and 4% lidocaine solution.  The flexible scope was inserted into the right nasal cavity and advanced towards the nasopharynx.  Visualized mucosa over the turbinates and septum were normal.  The nasopharynx was clear.  Oropharyngeal walls were symmetric and mobile without lesion, mass, or edema.  Hypopharynx was also without  lesion or edema.  Larynx was mobile without lesions.  No lesions or asymmetry in the supraglottic larynx.  Arytenoid mucosa was normal.  True vocal folds were pale yellow and edematous but without mass or lesion.  Base of tongue has no obvious lesion or mass. The patient tolerated the procedure well.    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  The patient's CT scan showed possible involvement of the right base of tongue and right lateral pharyngeal wall.  However, no obvious ulceration, lesion or mass is noted on today's laryngoscopy examination.   Plan: 1.  The laryngoscopy findings and the CT images are extensively reviewed with the patient.  2.  Based on the above findings, the decision is made for the patient to undergo direct laryngoscopy and biopsy of the base of tongue and pharyngeal wall. The specimen will be sent for frozen section analysis.  If the intraoperative frozen sections are negative, we will proceed with incisional biopsy of the right neck mass.   3.  The risks, benefits, and details of the procedures are reviewed with the patient.  4.  The patient would like to proceed with the procedures.

## 2018-01-02 NOTE — Transfer of Care (Signed)
Immediate Anesthesia Transfer of Care Note  Patient: Shawn [name]  Procedure(s) Performed: DIRECT LARYNGOSCOPY (Right ) TONGUE BIOPSY WITH FROZEN SECTION (Right ) POSSIBLE NECK MASS BIOPSY (Right )  Patient Location: PACU  Anesthesia Type:General  Level of Consciousness: awake, alert  and patient cooperative  Airway & Oxygen Therapy: Patient Spontanous Breathing and Patient connected to face mask oxygen  Post-op Assessment: Report given to RN, Post -op Vital signs reviewed and stable and Patient moving all extremities X 4  Post vital signs: Reviewed and stable  Last Vitals:  Vitals Value Taken Time  BP 138/94 01/02/2018  4:30 PM  Temp 36.4 C 01/02/2018  4:30 PM  Pulse 87 01/02/2018  4:32 PM  Resp 17 01/02/2018  4:32 PM  SpO2 96 % 01/02/2018  4:32 PM  Vitals shown include unvalidated device data.  Last Pain:  Vitals:   01/02/18 1230  TempSrc: Oral      Patients Stated Pain Goal: 3 (61/44/31 5400)  Complications: No apparent anesthesia complications

## 2018-01-02 NOTE — Progress Notes (Signed)
Pt brought to hospital by Reyne Dumas, a social worker with [facility].  Pt lives alone and has no family/friends to stay with him post-op. Social work consult placed to assist with transportation needs home when discharged.   Pt's pulse 40s-50s. Reports he normally keeps a low pulse rate.  Dr. Ola Spurr aware of above

## 2018-01-02 NOTE — Progress Notes (Signed)
Pt reports diarrhea this past week with 3 episodes today, Dr. Benjamine Mola made aware.

## 2018-01-02 NOTE — Op Note (Signed)
DATE OF PROCEDURE:  01/02/2018                              OPERATIVE REPORT  SURGEON:  Leta Baptist, MD  PREOPERATIVE DIAGNOSES: 1. Right neck mass 2. Possible pharyngeal lesions  POSTOPERATIVE DIAGNOSES: 1. Right neck mass  PROCEDURE PERFORMED:  1. Direct laryngoscopy with biopsy. 2. Open biopsy of deep right cervical mass.  ANESTHESIA:  General endotracheal tube anesthesia.  COMPLICATIONS:  None.  ESTIMATED BLOOD LOSS:  Minimal.  INDICATION FOR PROCEDURE:  Shawn [name] is a 67 y.o. male with a history of a large right neck mass.  The finding was concerning for malignancy.  No obvious suspicious mass or lesion was noted on his fiberoptic laryngoscopy examination.  The patient subsequently underwent a neck CT scan.  The CT showed a necrotic mass at the right Level II lymph node, measuring 4 cm in diameter.  The mass involved the right carotid space, occluded the transverse internal jugular vein, deep lobe of the parotid gland, and sternocleidomastoid muscles.  In addition, there were several other necrotic lymph nodes within right Level III and V.  The CT also showed asymmetric irregular mucosal thickening involving the right base of tongue, lateral and posterolateral wall of the oropharynx. Based on the above findings, the decision was made for the patient to undergo the direct laryngoscopy and biopsy of the right base of tongue and right lateral pharyngeal wall. If the biopsies were negative, the decision was to proceed with open biopsy of the right neck mass.  The risks, benefits, alternatives, and details of the procedure were discussed with the patient.  Questions were invited and answered.  Informed consent was obtained.  DESCRIPTION:  The patient was taken to the operating room and placed supine on the operating table.  General endotracheal tube anesthesia was administered by the anesthesiologist.  The patient was positioned and prepped and draped in a standard fashion for direct  laryngoscopy.  A Dedo laryngoscope was used for examination. The laryngoscope was inserted via the oral cavity into the pharynx. Examination of the base of tongue, vallecula, epiglottis, aryepiglottic folds, piriform sinuses, and the vocal cords were all normal. No obvious suspicious mass or lesion was noted. Several biopsy specimens were obtained from the right base of tongue and the right lateral pharyngeal wall. The specimens were sent to the pathology department for frozen section analysis. They were negative for malignancy.  The decision was made to proceed with open biopsy of the right neck mass. The patient was repositioned and prepped and draped in a standard fashion for right neck surgery. 1% lidocaine with 1-100,000 epinephrine was infiltrated into the right neck incision site. A horizontal 4 cm right neck incision was made. The incision was carried down to the level of the platysma muscles. Superior and inferiorly based subplatysmal flaps were elevated in a standard fashion. The strap muscles were identified and retracted laterally, exposing a large solid soft tissue mass. 3 large biopsy specimens were obtained from the right neck mass. Hemostasis was subsequently achieved. The surgical site was copiously irrigated. The incision was closed in layers with 4-0 Vicryl and Dermabond.   The care of the patient was turned over to the anesthesiologist.  The patient was awakened from anesthesia without difficulty.  The patient was extubated and transferred to the recovery room in good condition.  OPERATIVE FINDINGS:  A large 4 cm right neck mass. No suspicious mass or  lesion was noted on today's direct laryngoscopy examination. Blind biopsy specimens obtained from the right base of tongue and the right lateral pharyngeal wall were all negative.  SPECIMEN:  Pharyngeal biopsy specimens, right neck mass biopsy specimens.  FOLLOWUP CARE:  The patient will be observed overnight due to his social situation.  He will be discharged home tomorrow.  Shawn [name] W Shawn [name] 01/02/2018 4:23 PM

## 2018-01-02 NOTE — Anesthesia Preprocedure Evaluation (Addendum)
Anesthesia Evaluation  Patient identified by MRN, date of birth, ID band Patient awake    Reviewed: Allergy & Precautions, H&P , NPO status , Patient's Chart, lab work & pertinent test results  Airway Mallampati: I  TM Distance: >3 FB Neck ROM: Full    Dental no notable dental hx. (+) Edentulous Upper, Dental Advisory Given, Partial Lower   Pulmonary Current Smoker,    Pulmonary exam normal breath sounds clear to auscultation       Cardiovascular +CHF   Rhythm:Regular Rate:Normal     Neuro/Psych Anxiety Depression Bipolar Disorder negative neurological ROS     GI/Hepatic negative GI ROS, Neg liver ROS,   Endo/Other  negative endocrine ROS  Renal/GU negative Renal ROS  negative genitourinary   Musculoskeletal  (+) Arthritis , Osteoarthritis,    Abdominal   Peds  Hematology  (+) anemia ,   Anesthesia Other Findings   Reproductive/Obstetrics negative OB ROS                            Anesthesia Physical Anesthesia Plan  ASA: III  Anesthesia Plan: General   Post-op Pain Management:    Induction: Intravenous  PONV Risk Score and Plan: 2 and Ondansetron, Dexamethasone and Midazolam  Airway Management Planned: Oral ETT  Additional Equipment:   Intra-op Plan:   Post-operative Plan: Extubation in OR  Informed Consent: I have reviewed the patients History and Physical, chart, labs and discussed the procedure including the risks, benefits and alternatives for the proposed anesthesia with the patient or authorized representative who has indicated his/her understanding and acceptance.   Dental advisory given  Plan Discussed with: CRNA  Anesthesia Plan Comments:         Anesthesia Quick Evaluation

## 2018-01-03 DIAGNOSIS — C760 Malignant neoplasm of head, face and neck: Secondary | ICD-10-CM | POA: Diagnosis not present

## 2018-01-03 MED ORDER — OXYCODONE-ACETAMINOPHEN 5-325 MG PO TABS: 1.0000 | ORAL_TABLET | ORAL | 0 refills | Status: DC | PRN

## 2018-01-03 NOTE — Discharge Summary (Signed)
Physician Discharge Summary  Patient ID: Shawn [name] MRN: [id number] DOB/AGE: 03/28/1951 67 y.o.  Admit date: 01/02/2018 Discharge date: 01/03/2018  Admission Diagnoses: Right neck mass  Discharge Diagnoses: Right neck mass Active Problems:   Neck mass   Discharged Condition: good  Hospital Course: Pt had an uneventful overnight stay. Pt tolerated po well. No bleeding. No stridor.  Consults: None  Significant Diagnostic Studies: None  Treatments: surgery: Biopsy of neck mass  Discharge Exam: Blood pressure 114/66, pulse (!) 48, temperature 98.4 F (36.9 C), temperature source Oral, resp. rate 18, height 6' (1.829 m), weight 68.9 kg (151 lb 14.4 oz), SpO2 96 %.     Disposition: Discharge disposition: 01-Home or Self Care       Discharge Instructions    Activity as tolerated - No restrictions   Complete by:  As directed    Diet general   Complete by:  As directed      Allergies as of 01/03/2018      Reactions   Depakote [divalproex Sodium] Other (See Comments)   "Makes me crazy"   Codeine Nausea Only, Other (See Comments)   flushing      Medication List    STOP taking these medications   aspirin EC 81 MG tablet   meclizine 12.5 MG tablet Commonly known as:  ANTIVERT   multivitamin-iron-minerals-folic acid Tabs tablet   ondansetron 4 MG disintegrating tablet Commonly known as:  ZOFRAN ODT   pantoprazole 20 MG tablet Commonly known as:  PROTONIX   potassium chloride SA 20 MEQ tablet Commonly known as:  K-DUR,KLOR-CON   tamsulosin 0.4 MG Caps capsule Commonly known as:  FLOMAX     TAKE these medications   acetaminophen 500 MG tablet Commonly known as:  TYLENOL Take 1,000 mg by mouth every 6 (six) hours as needed for moderate pain or headache.   oxyCODONE-acetaminophen 5-325 MG tablet Commonly known as:  PERCOCET/ROXICET Take 1 tablet by mouth every 4 (four) hours as needed for severe pain.      Follow-up Information    Leta Baptist, MD On  01/08/2018.   Specialty:  Otolaryngology Why:  at [facility] information: [address] [place] [address] [phone]           Signed: Burley Saver 01/03/2018, 7:08 AM

## 2018-01-03 NOTE — Progress Notes (Signed)
CSW acknowledging consult for transportation home. CSW received contact from RN to check on patient's ability to be transported home. Patient has no supports available to come and pick him up. Patient's only source of support is a [facility], who provided transport to the patient to the hospital but is not working today as it's a weekend. Patient does not have the ability to pay for a taxi.  CSW provided cab voucher to RN to provide to patient when ready to return home. CSW signing off.  Laveda Abbe, [place] Clinical Social Worker [phone]

## 2018-01-03 NOTE — Progress Notes (Deleted)
Acuity:2 From: home alone Dispo:home Barriers:   IV MEDS (please list): morphine  OVERNIGHT EVENTS: none ADLS:  Standby assist  has a past medical history of Alcoholism ([place]), Allergy, Anemia, Anxiety, Anxiety and depression, Bipolar disorder ([place]), Cancer ([place]), CHF (congestive heart failure) ([place]), Chronic kidney disease, Depression, Diverticular disease, Fracture of metatarsal bone(s), closed, History of septic shock (11/2015), History of substance abuse, Mass, Multiple rib fractures (10/09/10), Osteoarthritis (X years), Osteoporosis, Solar dermatitis, Substance abuse ([place]), Tobacco dependence, and Vitamin B12 deficiency (10/2013).

## 2018-01-05 ENCOUNTER — Encounter (HOSPITAL_COMMUNITY): Payer: Self-pay | Admitting: Otolaryngology

## 2018-01-05 NOTE — Anesthesia Postprocedure Evaluation (Signed)
Anesthesia Post Note  Patient: Shawn [name]  Procedure(s) Performed: DIRECT LARYNGOSCOPY (Right ) TONGUE BIOPSY WITH FROZEN SECTION (Right ) POSSIBLE NECK MASS BIOPSY (Right )     Patient location during evaluation: PACU Anesthesia Type: General Level of consciousness: awake and alert, oriented and patient cooperative Pain management: pain level controlled Vital Signs Assessment: post-procedure vital signs reviewed and stable Respiratory status: spontaneous breathing, nonlabored ventilation, respiratory function stable and patient connected to nasal cannula oxygen Cardiovascular status: blood pressure returned to baseline and stable Postop Assessment: no apparent nausea or vomiting Anesthetic complications: no Comments: Delayed note entry, pt eval in PACU post op    Last Vitals:  Vitals:   01/02/18 2217 01/03/18 0518  BP: 133/72 114/66  Pulse: (!) 55 (!) 48  Resp: 18 18  Temp: 36.9 C   SpO2: 96% 96%    Last Pain:  Vitals:   01/03/18 1143  TempSrc:   PainSc: 0-No pain                 Marion Rosenberry,E. Tenecia Ignasiak

## 2018-01-08 ENCOUNTER — Other Ambulatory Visit (INDEPENDENT_AMBULATORY_CARE_PROVIDER_SITE_OTHER): Payer: Self-pay | Admitting: Otolaryngology

## 2018-01-08 DIAGNOSIS — C490 Malignant neoplasm of connective and soft tissue of head, face and neck: Secondary | ICD-10-CM

## 2018-01-09 ENCOUNTER — Ambulatory Visit (HOSPITAL_COMMUNITY): Payer: Self-pay | Admitting: Hematology

## 2018-01-12 ENCOUNTER — Encounter (HOSPITAL_COMMUNITY)
Admission: RE | Admit: 2018-01-12 | Discharge: 2018-01-12 | Disposition: A | Discharge status: Home / Self Care | Payer: Medicare Other | Source: Ambulatory Visit | Attending: Otolaryngology | Admitting: Otolaryngology

## 2018-01-12 DIAGNOSIS — C490 Malignant neoplasm of connective and soft tissue of head, face and neck: Secondary | ICD-10-CM

## 2018-01-12 MED ORDER — FLUDEOXYGLUCOSE F - 18 (FDG) INJECTION
10.0000 | Freq: Once | INTRAVENOUS | Status: AC | PRN
  Administered 2018-01-12: 10.03 via INTRAVENOUS

## 2018-01-14 ENCOUNTER — Encounter (HOSPITAL_COMMUNITY): Payer: Self-pay | Admitting: Hematology

## 2018-01-14 ENCOUNTER — Inpatient Hospital Stay (HOSPITAL_COMMUNITY): Payer: Medicare Other | Attending: Hematology | Admitting: Hematology

## 2018-01-14 ENCOUNTER — Encounter: Payer: Self-pay | Admitting: General Practice

## 2018-01-14 ENCOUNTER — Other Ambulatory Visit: Payer: Self-pay

## 2018-01-14 VITALS — BP 131/79 | HR 53 | Temp 97.6°F | Resp 20 | Ht 69.0 in | Wt 145.9 lb

## 2018-01-14 DIAGNOSIS — R110 Nausea: Secondary | ICD-10-CM | POA: Insufficient documentation

## 2018-01-14 DIAGNOSIS — C760 Malignant neoplasm of head, face and neck: Secondary | ICD-10-CM

## 2018-01-14 DIAGNOSIS — C799 Secondary malignant neoplasm of unspecified site: Secondary | ICD-10-CM

## 2018-01-14 DIAGNOSIS — R221 Localized swelling, mass and lump, neck: Secondary | ICD-10-CM

## 2018-01-14 DIAGNOSIS — R634 Abnormal weight loss: Secondary | ICD-10-CM

## 2018-01-14 DIAGNOSIS — G893 Neoplasm related pain (acute) (chronic): Secondary | ICD-10-CM | POA: Diagnosis not present

## 2018-01-14 DIAGNOSIS — Z7189 Other specified counseling: Secondary | ICD-10-CM

## 2018-01-14 DIAGNOSIS — IMO0002 Reserved for concepts with insufficient information to code with codable children: Secondary | ICD-10-CM

## 2018-01-14 MED ORDER — PROCHLORPERAZINE MALEATE 10 MG PO TABS: 10.0000 mg | ORAL_TABLET | Freq: Four times a day (QID) | ORAL | 3 refills | Status: AC | PRN

## 2018-01-14 MED ORDER — DEXAMETHASONE 4 MG PO TABS: 4.0000 mg | ORAL_TABLET | Freq: Two times a day (BID) | ORAL | 0 refills | Status: DC

## 2018-01-14 MED ORDER — HYDROCODONE-ACETAMINOPHEN 7.5-325 MG PO TABS: 1.0000 | ORAL_TABLET | Freq: Four times a day (QID) | ORAL | 0 refills | Status: AC | PRN

## 2018-01-14 NOTE — Progress Notes (Signed)
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  patient's office visit and evaluation.  I have reviewed this note for its completeness and accuracy.  I have edited this note accordingly based on my findings and medical opinion.      Derek Jack, MD 01/14/2018 1:14 PM

## 2018-01-14 NOTE — Assessment & Plan Note (Addendum)
1.  Metastatic poorly differentiated squamous cell carcinoma: - Evaluated by PMD in March for right neck adenopathy. -Seen by Dr. Benjamine Mola and a CT scan on 12/24/2017 shows asymmetric irregular mucosal thickening at the right base of tongue, right lateral oropharynx, and extending to right posterior soft palate combined with bulky necrotic lymphadenopathy at the right level 2, 3, and 5 stations. - Biopsy of the right neck mass on 01/02/2018 shows poorly differentiated squamous cell carcinoma, tongue biopsy and pharynx biopsy negative for malignancy.  We will obtain HPV testing. 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  We have talked about the advanced nature of this disease which is treated in the palliative setting.  We talked about the options of best supportive care in the form of hospice versus active treatment to prolong survival and improve symptoms such as better pain control.  Patient opted for active treatments.  He is currently living in an apartment and is a week from 40 pound weight loss in the last year.  He does not have good appetite.  We will make a referral to social work, and dietitian.  We will also make a referral for Port-A-Cath placement.  Patient reportedly presented to [facility] with lightheadedness and was diagnosed with vertigo.  He was given meclizine.  Reportedly CT scan of the brain was done.  He no longer has vertigo at this time.  2.  Cancer related pain: - Primary sites of pain or right neck and lower back.  8 out of 10 in intensity, sharp and dull.  Pain is constant.  I will prescribe him hydrocodone 7.5 mg to be taken every 6 hours as needed.  We will also start him on dexamethasone 4 mg twice daily.  If the pain does not improve, we will consider palliative  radiation to the lower back.  3.  Nausea: I have sent a prescription for Compazine 10 mg every 6 hours as needed.

## 2018-01-14 NOTE — Progress Notes (Signed)
AP CC CSW Progress Notes  CSW spoke w [facility], Sherlean Foot (worker). Mr Doree Fudge is patient's payee for his disability benefits, program ensures that all expenses for rent, utilities and similar are paid.  Patient has little contact w family, Mr Doree Fudge is his primary support.  Mr Doree Fudge would like to receive information on appointments and will help schedule [facility] transportation for patient.  CSW discussed other available supports including [facility], program will connect patient to appropriate community resources.  Wants patient referred for palliative care consult as well as nutrition consult.  Pt reported that he eats one meal/day and is not hungry. Staff message sent to Lupita Raider w this request.    Edwyna Shell, LCSW Clinical Social Worker Phone:  [phone]

## 2018-01-14 NOTE — Progress Notes (Signed)
START ON PATHWAY REGIMEN - Head and Neck   Carboplatin + Paclitaxel + Cetuximab q21d:   A cycle is every 21 days:     Cetuximab      Cetuximab      Paclitaxel      Carboplatin   **Always confirm dose/schedule in your pharmacy ordering system**    Cetuximab Maintenance (4 weeks per order sheet):   Administer weekly:     Cetuximab   **Always confirm dose/schedule in your pharmacy ordering system**    Patient Characteristics: Occult Primary, Metastatic, First Line Disease Classification: Occult Primary Current Disease Status: Metastatic Disease AJCC T Category: T0 AJCC N Category: NX AJCC M Category: M1 AJCC Stage Grouping: IVC Line of therapy: First Line  Intent of Therapy: Non-Curative / Palliative Intent, Discussed with Patient

## 2018-01-15 ENCOUNTER — Ambulatory Visit: Payer: Medicare Other | Admitting: General Surgery

## 2018-01-15 ENCOUNTER — Encounter (HOSPITAL_COMMUNITY): Payer: Self-pay | Admitting: General Practice

## 2018-01-15 MED ORDER — LIDOCAINE-PRILOCAINE 2.5-2.5 % EX CREA: TOPICAL_CREAM | CUTANEOUS | 3 refills | Status: AC

## 2018-01-15 NOTE — Patient Instructions (Signed)
[place]   CHEMOTHERAPY INSTRUCTIONS  We are going to treat you with carboplatin and taxol plus or minus Erbitux every 3 weeks. This treatment is given with palliative intent, which means you are treatable but not curable.  You will see the doctor regularly throughout treatment.  We monitor your lab work prior to every treatment.  The doctor monitors your response to treatment by the way you are feeling, your blood work, and scans periodically.  There will be wait times while you are here for treatment.  It will take about 30 minutes to 1 hour for your lab work to result.  Then the pharmacy has to mix your medications and bring them to the clinic.    You will have the following premedications prior to receiving chemotherapy:  Premeds: Benadryl:  Help prevent a reaction to the chemotherapy.  Pepcid: antihistamine premed Aloxi - high powered nausea/vomiting prevention medication used for chemotherapy patients.  Dexamethasone - steroid - given to reduce the risk of you having an allergic type reaction to the chemotherapy. Dex can cause you to feel energized, nervous/anxious/jittery, make you have trouble sleeping, and/or make you feel hot/flushed in the face/neck and/or look pink/red in the face/neck. These side effects will pass as the Dex wears off. (takes 20 minutes to infuse)   Udenyca - this medication is not chemo but being given because you have had chemo. It is usually given 24-27 hours after the completion of chemotherapy. This medication works by boosting your bone marrow's supply of white blood cells. White blood cells are what protect our bodies against infection. The medication is given in the form of a subcutaneous injection. It is given in the fatty tissue of your abdomen. It is a short needle. The major side effect of this medication is bone or muscle pain. The drug of choice to relieve or lessen the pain is Aleve or Ibuprofen. If a physician has ever told  you not to take Aleve or Ibuprofen - then don't take it. You should then take Tylenol/acetaminophen. Take either medication as the bottle directs you to.  The level of pain you experience as a result of this injection can range from none, to mild or moderate, or severe. Please let us know if you develop moderate or severe bone pain.   You can take Claritin 10 mg over the counter for a few days after receiving udenyca to help with the bone aches and pains.       POTENTIAL SIDE EFFECTS OF TREATMENT: Carboplatin (Generic Name) Other Names: Paraplatin, CBDCA  About This Drug Carboplatin is a drug used to treat cancer. This drug is given in the vein (IV). This drug takes about 30 minutes to infuse.   Possible Side Effects (More Common) . Nausea and throwing up (vomiting). These symptoms may happen within a few hours after your treatment and may last up to 24 hours. Medicines are available to stop or lessen these side effects. . Bone marrow depression. This is a decrease in the number of white blood cells, red blood cells, and platelets. This may raise your risk of infection, make you tired and weak (fatigue), and raise your risk of bleeding. . Soreness of the mouth and throat. You may have red areas, white patches, or sores that hurt. . This drug may affect how your kidneys work. Your kidney function will be checked as needed. . Electrolyte changes. Your blood will be checked for electrolyte changes as needed.  Side  Effects (Less Common) . Hair loss. Some patients lose their hair on the scalp and body. You may notice your hair thinning seven to 14 days after getting this drug. . Effects on the nerves are called peripheral neuropathy. You may feel numbness, tingling, or pain in your hands and feet. It may be hard for you to button your clothes, open jars, or walk as usual. The effect on the nerves may get worse with more doses of the drug. These effects get better in some people after the drug is  stopped but it does not get better in all people. . Loose bowel movements (diarrhea) that may last for several days . Decreased hearing or ringing in the ears . Changes in the way food and drinks taste . Changes in liver function. Your liver function will be checked as needed.  Allergic Reactions Serious allergic reactions including anaphylaxis are rare. While you are getting this drug in your vein (IV), tell your nurse right away if you have any of these symptoms of an allergic reaction: . Trouble catching your breath . Feeling like your tongue or throat are swelling . Feeling your heart beat quickly or in a not normal way (palpitations) . Feeling dizzy or lightheaded . Flushing, itching, rash, and/or hives  Treating Side Effects . Drink 6-8 cups of fluids each day unless your doctor has told you to limit your fluid intake due to some other health problem. A cup is 8 ounces of fluid. If you throw up or have loose bowel movements, you should drink more fluids so that you do not become dehydrated (lack water in the body from losing too much fluid). . Mouth care is very important. Your mouth care should consist of routine, gentle cleaning of your teeth or dentures and rinsing your mouth with a mixture of 1/2 teaspoon of salt in 8 ounces of water or  teaspoon of baking soda in 8 ounces of water. This should be done at least after each meal and at bedtime. . If you have mouth sores, avoid mouthwash that has alcohol. Avoid alcohol and smoking because they can bother your mouth and throat. . If you have numbness and tingling in your hands and feet, be careful when cooking, walking, and handling sharp objects and hot liquids. . Talk with your nurse about getting a wig before you lose your hair. Also, call the [facility] at 800-ACS-2345 to find out information about the "Look Good, Feel Better" program close to where you live. It is a free program where women getting chemotherapy can learn  about wigs, turbans and scarves as well as makeup techniques and skin and nail care.  Food and Drug Interactions There are no known interactions of carboplatin with food. This drug may interact with other medicines. Tell your doctor and pharmacist about all the medicines and dietary supplements (vitamins, minerals, herbs and others) that you are taking at this time. The safety and use of dietary supplements and alternative diets are often not known. Using these might affect your cancer or interfere with your treatment. Until more is known, you should not use dietary supplements or alternative diets without your doctor's help.  When to Call the Doctor Call your doctor or nurse right away if you have any of these symptoms: . Fever of 100.5 F (38 C) or above; chills . Bleeding or bruising that is not normal . Wheezing or trouble breathing . Nausea that stops you from eating or drinking . Throwing up more  than once a day . Rash or itching . Loose bowel movements (diarrhea) more than four times a day or diarrhea with weakness or feeling lightheaded . Call your doctor or nurse as soon as possible if any of these symptoms happen: . Numbness, tingling, decreased feeling or weakness in fingers, toes, arms, or legs . Change in hearing, ringing in the ears . Blurred vision or other changes in eyesight . Decreased urine . Yellowing of skin or eyes  Problems and Reproductive Concerns Sexual problems and reproduction concerns may happen. In both men and women, this drug may affect your ability to have children. This cannot be determined before your treatment. Talk with your doctor or nurse if you plan to have children. Ask for information on sperm or egg banking. In men, this drug may interfere with your ability to make sperm, but it should not change your ability to have sexual relations. In women, menstrual bleeding may become irregular or stop while you are getting this drug. Do not assume that you cannot  become pregnant if you do not have a menstrual period. Women may go through signs of menopause (change of life) like vaginal dryness or itching. Vaginal lubricants can be used to lessen vaginal dryness, itching, and pain during sexual relations. Genetic counseling is available for you to talk about the effects of this drug therapy on future pregnancies. Also, a genetic counselor can look at the possible risk of problems in the unborn baby due to this medicine if an exposure happens during pregnancy. . Pregnancy warning: This drug may have harmful effects on the unborn child, so effective methods of birth control should be used during your cancer treatment. . Breast feeding warning: It is not known if this drug passes into breast milk. For this reason, women should talk to their doctor about the risks and benefits of breast feeding during treatment with this drug because this drug may enter the breast milk and badly harm a breast feeding baby.   Paclitaxel (Taxol)  Paclitaxel is a drug used to treat cancer. It is given in the vein (IV).  This will take 3 hours to infuse.  The first time this drug is given it will take longer to infuse because it is increased slowly to monitor for reactions.  The nurse will be in the room with you for the first 15 minutes.   Possible Side Effects . Hair loss. Hair loss is often temporary, although with certain medicine, hair loss can sometimes be permanent. Hair loss may happen suddenly or gradually. If you lose hair, you may lose it from your head, face, armpits, pubic area, chest, and/or legs. You may also notice your hair getting thin. . Swelling of your legs, ankles and/or feet (edema) . Flushing . Nausea and throwing up (vomiting) . Loose bowel movements (diarrhea) . Bone marrow depression. This is a decrease in the number of white blood cells, red blood cells, and platelets. This may raise your risk of infection, make you tired and weak (fatigue), and raise your  risk of bleeding. . Effects on the nerves are called peripheral neuropathy. You may feel numbness, tingling, or pain in your hands and feet. It may be hard for you to button your clothes, open jars, or walk as usual. The effect on the nerves may get worse with more doses of the drug. These effects get better in some people after the drug is stopped but it does not get better in all people. . Changes in your  liver function . Bone, joint and muscle pain . Abnormal EKG . Allergic reaction: Allergic reactions, including anaphylaxis are rare but may happen in some patients. Signs of allergic reaction to this drug may be swelling of the face, feeling like your tongue or throat are swelling, trouble breathing, rash, itching, fever, chills, feeling dizzy, and/or feeling that your heart is beating in a fast or not normal way. If this happens, do not take another dose of this drug. You should get urgent medical treatment. . Infection . Changes in your kidney function. Note: Each of the side effects above was reported in 20% or greater of patients treated with paclitaxel. Not all possible side effects are included above.  Warnings and Precautions . Severe bone marrow depression  Side Effects . To help with hair loss, wash with a mild shampoo and avoid washing your hair every day. . Avoid rubbing your scalp, instead, pat your hair or scalp dry . Avoid coloring your hair . Limit your use of hair spray, electric curlers, blow dryers, and curling irons. . If you are interested in getting a wig, talk to your nurse. You can also call the [facility] at 800-ACS-2345 to find out information about the "Look Good, Feel Better" program close to where you live. It is a free program where women getting chemotherapy can learn about wigs, turbans and scarves as well as makeup techniques and skin and nail care. . Ask your doctor or nurse about medicines that are available to help stop or lessen diarrhea and/or  nausea. . To help with nausea and vomiting, eat small, frequent meals instead of three large meals a day. Choose foods and drinks that are at room temperature. Ask your nurse or doctor about other helpful tips and medicine that is available to help or stop lessen these symptoms. . If you get diarrhea, eat low-fiber foods that are high in protein and calories and avoid foods that can irritate your digestive tracts or lead to cramping. Ask your nurse or doctor about medicine that can lessen or stop your diarrhea. . Mouth care is very important. Your mouth care should consist of routine, gentle cleaning of your teeth or dentures and rinsing your mouth with a mixture of 1/2 teaspoon of salt in 8 ounces of water or  teaspoon of baking soda in 8 ounces of water. This should be done at least after each meal and at bedtime. . If you have mouth sores, avoid mouthwash that has alcohol. Also avoid alcohol and smoking because they can bother your mouth and throat. . Drink plenty of fluids (a minimum of eight glasses per day is recommended). . Take your temperature as your doctor or nurse tells you, and whenever you feel like you may have a fever. . Talk to your doctor or nurse about precautions you can take to avoid infections and bleeding. . Be careful when cooking, walking, and handling sharp objects and hot liquids.  Food and Drug Interactions . There are no known interactions of paclitaxel with food. . This drug may interact with other medicines. Tell your doctor and pharmacist about all the medicines and dietary supplements (vitamins, minerals, herbs and others) that you are taking at this time. . The safety and use of dietary supplements and alternative diets are often not known. Using these might affect your cancer or interfere with your treatment. Until more is known, you should not use dietary supplements or alternative diets without your cancer doctor's help.  When to Call  the Doctor Call your doctor  or nurse if you have any of the following symptoms and/or any new or unusual symptoms: . Fever of 100.5 F (38 C) or above . Chills . Redness, pain, warmth, or swelling at the IV site during the infusion . Signs of allergic reaction: swelling of the face, feeling like your tongue or throat are swelling, trouble breathing, rash, itching, fever, chills, feeling dizzy, and/or feeling that your heart is beating in a fast or not normal way . Feeling that your heart is beating in a fast or not normal way (palpitations) . Weight gain of 5 pounds in one week (fluid retention) . Decreased urine or very dark urine . Signs of liver problems: dark urine, pale bowel movements, bad stomach pain, feeling very tired and weak, unusual itching, or yellowing of the eyes or skin . Heavy menstrual period that lasts longer than normal . Easy bruising or bleeding . Nausea that stops you from eating or drinking, and/or that is not relieved by prescribed medicines. . Loose bowel movements (diarrhea) more than 4 times a day or diarrhea with weakness or lightheadedness . Pain in your mouth or throat that makes it hard to eat or drink . Lasting loss of appetite or rapid weight loss of five pounds in a week . Signs of peripheral neuropathy: numbness, tingling, or decreased feeling in fingers or toes; trouble walking or changes in the way you walk; or feeling clumsy when buttoning clothes, opening jars, or other routine activities . Joint and muscle pain that is not relieved by prescribed medicines . Extreme fatigue that interferes with normal activities . While you are getting this drug, please tell your nurse right away if you have any pain, redness, or swelling at the site of the IV infusion. . If you think you are pregnant.  Reproduction Warnings . Pregnancy warning: This drug may have harmful effects on the unborn child, it is recommended that effective methods of birth control should be used during your cancer  treatment. Let your doctor know right away if you think you may be pregnant. . Breast feeding warning: Women should not breast feed during treatment because this drug could enter the breast milk and cause harm to a breast feeding baby.   Cetuximab (Erbitux)  About This Drug Cetuximab is used to treat cancer. It is given in the vein (IV).  Takes about one hour to infuse.  Possible Side Effects . Infection . Headache . Loose bowel movements (diarrhea) . Rash . Itching . Nail loss and/or brittle nail Note: Each of the side effects above was reported in 25% or greater of patients treated with cetuximab. Not all possible side effects are included above.  Warnings and Precautions . While you are getting this drug in your vein (IV), you may have a reaction to the drug, which can be life-threatening. This risk is increased if you have a history of tick bites, a red meat allergy, or IgE antibodies against galactose-alpha-1,3-galactose (alpha-gal). Sometimes you may be given medication to stop or lessen these side effects. Your nurse will check you closely for these signs: fever or shaking chills, flushing, facial swelling, feeling dizzy, headache, trouble breathing, rash, itching, chest tightness, or chest pain. These reactions may happen after your infusion. If this happens, call 911 for emergency care. Marland Kitchen Heart attack and sudden death . Inflammation (swelling) of the lungs, which can be life-threatening. You may have a dry cough or trouble breathing. . Severe skin reaction, which can be  life-threatening. You may develop blisters on your skin that are filled with fluid or a severe red rash all over your body that may be painful. . Electrolyte changes, especially low magnesium Note: Some of the side effects above are very rare. If you have concerns and/or questions, please discuss them with your medical team.  Important Information . This drug may be present in the saliva, tears, sweat, urine,  stool, vomit, semen, and vaginal secretions. Talk to your doctor and/or your nurse about the necessary precautions to take during this time.  Treating Side Effects . Drink plenty of fluids (a minimum of eight glasses per day is recommended). . If you throw up or have loose bowel movements, you should drink more fluids so that you do not become dehydrated (lack water in the body from losing too much fluid). . If you get diarrhea, eat low-fiber foods that are high in protein and calories and avoid foods that can irritate your digestive tracts or lead to cramping. . Ask your nurse or doctor about medicine that can lessen or stop your diarrhea. . Use sunscreen with SPF 30 or higher when you are outdoors even for a short time. Cover up when you are out in the sun. Wear wide-brimmed hats, long-sleeved shirts, and pants. Keep your neck, chest, and back covered. Follow these guidelines for at least 2 months after treatment. . If you get a rash do not put anything on it unless your doctor or nurse says you may. Keep the area around the rash clean and dry. Ask your doctor for medicine if your rash bothers you. . Infusion reactions may occur after your infusion. If this happens, call 911 for emergency care.  Food and Drug Interactions . There are no known interactions of cetuximab with food and other medications. . Tell your doctor and pharmacist about all the prescription and over-the-counter medicines and dietary supplements (vitamins, minerals, herbs and others) that you are taking at this time. Also, check with your doctor or pharmacist before starting any new prescription or over-the-counter medicines, or dietary supplements to make sure that there are no interactions. . The safety and use of dietary supplements and alternative diets are often not known. Using these might affect your cancer or interfere with your treatment. Until more is known, you should not use dietary supplements or alternative diets  without your cancer doctor's help.  When to Call the Doctor Call your doctor or nurse if you have any of these symptoms and/or any new or unusual symptoms: . Fever of 100.4 F (38 C) or higher . Chills . Pain in your chest . Dry cough . Trouble breathing . Chest pain or symptoms of a heart attack. Most heart attacks involve pain in the center of the chest that lasts more than a few minutes. The pain may go away and come back or it can be constant. It can feel like pressure, squeezing, fullness, or pain. Sometimes pain is felt in one or both arms, the back, neck, jaw, or stomach. If any of these symptoms last 2 minutes, call 911. Marland Kitchen Headache that does not go away . Flu-like symptoms: fever, headache, muscle and joint aches, and fatigue (low energy, feeling weak) . A new rash or a rash that is not relieved by prescribed medicines . Signs of infusion reaction: fever or shaking chills, flushing, facial swelling, feeling dizzy, headache, trouble breathing, rash, itching, chest tightness, or chest pain. . Loose bowel movements (diarrhea) 4 times a day or loose bowel  movements with lack of strength or a feeling of being dizzy . If you think you may be pregnant  Reproduction Warnings . Pregnancy warning: This drug can have harmful effects on the unborn baby. Women of childbearing potential should use effective methods of birth control during your cancer treatment and for 2 months after treatment. Let your doctor know right away if you think you may be pregnant. . Breastfeeding warning: Women should not breastfeed during treatment and for 2 months after treatment because this drug could enter the breast milk and cause harm to a breastfeeding baby. . Fertility warning: In women, this drug may affect your ability to have children in the future. Talk with your doctor or nurse if you plan to have children. Ask for information on egg banking.   SELF CARE ACTIVITIES WHILE ON  CHEMOTHERAPY: Hydration Increase your fluid intake 48 hours prior to treatment and drink at least 8 to 12 cups (64 ounces) of water/decaff beverages per day after treatment. You can still have your cup of coffee or soda but these beverages do not count as part of your 8 to 12 cups that you need to drink daily. No alcohol intake.  Medications Continue taking your normal prescription medication as prescribed.  If you start any new herbal or new supplements please let us know first to make sure it is safe.  Mouth Care Have teeth cleaned professionally before starting treatment. Keep dentures and partial plates clean. Use soft toothbrush and do not use mouthwashes that contain alcohol. Biotene is a good mouthwash that is available at most pharmacies or may be ordered by calling [phone]. Use warm salt water gargles (1 teaspoon salt per 1 quart warm water) before and after meals and at bedtime. Or you may rinse with 2 tablespoons of three-percent hydrogen peroxide mixed in eight ounces of water. If you are still having problems with your mouth or sores in your mouth please call the clinic. If you need dental work, please let the doctor know before you go for your appointment so that we can coordinate the best possible time for you in regards to your chemo regimen. You need to also let your dentist know that you are actively taking chemo. We may need to do labs prior to your dental appointment.  Skin Care Always use sunscreen that has not expired and with SPF (Sun Protection Factor) of 50 or higher. Wear hats to protect your head from the sun. Remember to use sunscreen on your hands, ears, face, & feet.  Use good moisturizing lotions such as udder cream, eucerin, or even Vaseline. Some chemotherapies can cause dry skin, color changes in your skin and nails.    . Avoid long, hot showers or baths. . Use gentle, fragrance-free soaps and laundry detergent. . Use moisturizers, preferably creams or  ointments rather than lotions because the thicker consistency is better at preventing skin dehydration. Apply the cream or ointment within 15 minutes of showering. Reapply moisturizer at night, and moisturize your hands every time after you wash them.  Hair Loss (if your doctor says your hair will fall out)  . If your doctor says that your hair is likely to fall out, decide before you begin chemo whether you want to wear a wig. You may want to shop before treatment to match your hair color. . Hats, turbans, and scarves can also camouflage hair loss, although some people prefer to leave their heads uncovered. If you go bare-headed outdoors, be sure to use  sunscreen on your scalp. . Cut your hair short. It eases the inconvenience of shedding lots of hair, but it also can reduce the emotional impact of watching your hair fall out. . Don't perm or color your hair during chemotherapy. Those chemical treatments are already damaging to hair and can enhance hair loss. Once your chemo treatments are done and your hair has grown back, it's OK to resume dyeing or perming hair. With chemotherapy, hair loss is almost always temporary. But when it grows back, it may be a different color or texture. In older adults who still had hair color before chemotherapy, the new growth may be completely gray.  Often, new hair is very fine and soft.  Infection Prevention Please wash your hands for at least 30 seconds using warm soapy water. Handwashing is the #1 way to prevent the spread of germs. Stay away from sick people or people who are getting over a cold. If you develop respiratory systems such as green/yellow mucus production or productive cough or persistent cough let us know and we will see if you need an antibiotic. It is a good idea to keep a pair of gloves on when going into grocery stores/[employer] to decrease your risk of coming into contact with germs on the carts, etc. Carry alcohol hand gel with you at all times and  use it frequently if out in public. If your temperature reaches 100.5 or higher please call the clinic and let us know.  If it is after hours or on the weekend please go to the ER if your temperature is over 100.5.  Please have your own personal thermometer at home to use.    Sex and bodily fluids If you are going to have sex, a condom must be used to protect the person that isn't taking chemotherapy. Chemo can decrease your libido (sex drive). For a few days after chemotherapy, chemotherapy can be excreted through your bodily fluids.  When using the toilet please close the lid and flush the toilet twice.  Do this for a few day after you have had chemotherapy.   Effects of chemotherapy on your sex life Some changes are simple and won't last long. They won't affect your sex life permanently. Sometimes you may feel: . too tired . not strong enough to be very active . sick or sore  . not in the mood . anxious or low Your anxiety might not seem related to sex. For example, you may be worried about the cancer and how your treatment is going. Or you may be worried about money, or about how you family are coping with your illness. These things can cause stress, which can affect your interest in sex. It's important to talk to your partner about how you feel. Remember - the changes to your sex life don't usually last long. There's usually no medical reason to stop having sex during chemo. The drugs won't have any long term physical effects on your performance or enjoyment of sex. Cancer can't be passed on to your partner during sex  Contraception It's important to use reliable contraception during treatment. Avoid getting pregnant while you or your partner are having chemotherapy. This is because the drugs may harm the baby. Sometimes chemotherapy drugs can leave a man or woman infertile.  This means you would not be able to have children in the future. You might want to talk to someone about permanent  infertility. It can be very difficult to learn that you may no  longer be able to have children. Some people find counselling helpful. There might be ways to preserve your fertility, although this is easier for men than for women. You may want to speak to a fertility expert. You can talk about sperm banking or harvesting your eggs. You can also ask about other fertility options, such as donor eggs. If you have or have had breast cancer, your doctor might advise you not to take the contraceptive pill. This is because the hormones in it might affect the cancer.  It is not known for sure whether or not chemotherapy drugs can be passed on through semen or secretions from the vagina. Because of this some doctors advise people to use a barrier method if you have sex during treatment. This applies to vaginal, anal or oral sex. Generally, doctors advise a barrier method only for the time you are actually having the treatment and for about a week after your treatment. Advice like this can be worrying, but this does not mean that you have to avoid being intimate with your partner. You can still have close contact with your partner and continue to enjoy sex.  Animals If you have cats or birds we just ask that you not change the litter or change the cage.  Please have someone else do this for you while you are on chemotherapy.   Food Safety During and After Cancer Treatment Food safety is important for people both during and after cancer treatment. Cancer and cancer treatments, such as chemotherapy, radiation therapy, and stem cell/bone marrow transplantation, often weaken the immune system. This makes it harder for your body to protect itself from foodborne illness, also called food poisoning. Foodborne illness is caused by eating food that contains harmful bacteria, parasites, or viruses.  Foods to avoid Some foods have a higher risk of becoming tainted with bacteria. These include: Marland Kitchen Unwashed fresh fruit and  vegetables, especially leafy vegetables that can hide dirt and other contaminants . Raw sprouts, such as alfalfa sprouts . Raw or undercooked beef, especially ground beef, or other raw or undercooked meat and poultry . Fatty, fried, or spicy foods immediately before or after treatment.  These can sit heavy on your stomach and make you feel nauseous. . Raw or undercooked shellfish, such as oysters. . Sushi and sashimi, which often contain raw fish.  . Unpasteurized beverages, such as unpasteurized fruit juices, raw milk, raw yogurt, or cider . Undercooked eggs, such as soft boiled, over easy, and poached; raw, unpasteurized eggs; or foods made with raw egg, such as homemade raw cookie dough and homemade mayonnaise Simple steps for food safety Shop smart. . Do not buy food stored or displayed in an unclean area. . Do not buy bruised or damaged fruits or vegetables. . Do not buy cans that have cracks, dents, or bulges. . Pick up foods that can spoil at the end of your shopping trip and store them in a cooler on the way home. Prepare and clean up foods carefully. . Rinse all fresh fruits and vegetables under running water, and dry them with a clean towel or paper towel. . Clean the top of cans before opening them. . After preparing food, wash your hands for 20 seconds with hot water and soap. Pay special attention to areas between fingers and under nails. . Clean your utensils and dishes with hot water and soap. Marland Kitchen Disinfect your kitchen and cutting boards using 1 teaspoon of liquid, unscented bleach mixed into 1 quart of water.  Dispose of old food. . Eat canned and packaged food before its expiration date (the "use by" or "best before" date). . Consume refrigerated leftovers within 3 to 4 days. After that time, throw out the food. Even if the food does not smell or look spoiled, it still may be unsafe. Some bacteria, such as Listeria, can grow even on foods stored in the refrigerator if they are  kept for too long. Take precautions when eating out. . At restaurants, avoid buffets and salad bars where food sits out for a long time and comes in contact with many people. Food can become contaminated when someone with a virus, often a norovirus, or another "bug" handles it. . Put any leftover food in a "to-go" container yourself, rather than having the server do it. And, refrigerate leftovers as soon as you get home. . Choose restaurants that are clean and that are willing to prepare your food as you order it cooked.    MEDICATIONS:                                                                                                                   Compazine/Prochlorperazine 10mg  tablet. Take 1 tablet every 6 hours as needed for nausea/vomiting. (this can make you sleepy)  EMLA cream. Apply a quarter size amount to port site 1 hour prior to chemo. Do not rub in. Cover with plastic wrap.  Over-the-Counter Meds: Miralax 1 capful in 8 oz of fluid daily. May increase to two times a day if needed. This is a stool softener. If this doesn't work proceed you can add:  Senokot S-start with 1 tablet two times a day and increase to 4 tablets two times a day if needed. (total of 8 tablets in a 24 hour period). This is a stimulant laxative.  Call us if this does not help your bowels move.   Imodium 2mg  capsule. Take 2 capsules after the 1st loose stool and then 1 capsule every 2 hours until you go a total of 12 hours without having a loose stool. Call the [facility] if loose stools continue. If diarrhea occurs @ bedtime, take 2 capsules @ bedtime. Then take 2 capsules every 4 hours until morning. Call [facility].   Diarrhea Sheet  If you are having loose stools/diarrhea, please purchase Imodium and begin taking as outlined:  At the first sign of poorly formed or loose stools you should begin taking Imodium(loperamide) 2 mg capsules.  Take two caplets (4mg ) followed by one caplet (2mg ) every 2 hours  until you have had no diarrhea for 12 hours.  During the night take two caplets (4mg ) at bedtime and continue every 4 hours during the night until the morning.  Stop taking Imodium only after there is no sign of diarrhea for 12 hours.    Always call the [facility] if you are having loose stools/diarrhea that you can't get under control.  Loose stools/diarrhea leads to dehydration (loss of water) in your body.  We have other  options of trying to get the loose stools/diarrhea to stopped but you must let us know!   Constipation Sheet *Miralax in 8 oz of fluid daily.  May increase to two times a day if needed.  This is a stool softener.  If this not enough to keep your bowel regular:  You can add:  *Senokot S, start with one tablet twice a day and can increase to 4 tablets twice a day if needed.  This is a stimulant laxative.   Sometimes when you take pain medication you need BOTH a medicine to keep your stool soft and a medicine to help your bowel push it out!  Please call if the above does not work for you.   Do not go more than 2 days without a bowel movement.  It is very important that you do not become constipated.  It will make you feel sick to your stomach (nausea) and can cause abdominal pain and vomiting.     SYMPTOMS TO REPORT AS SOON AS POSSIBLE AFTER TREATMENT:  FEVER GREATER THAN 100.5 F  CHILLS WITH OR WITHOUT FEVER  NAUSEA AND VOMITING THAT IS NOT CONTROLLED WITH YOUR NAUSEA MEDICATION  UNUSUAL SHORTNESS OF BREATH  UNUSUAL BRUISING OR BLEEDING  TENDERNESS IN MOUTH AND THROAT WITH OR WITHOUT PRESENCE OF ULCERS  URINARY PROBLEMS  BOWEL PROBLEMS  UNUSUAL RASH      Wear comfortable clothing and clothing appropriate for easy access to any Portacath or PICC line. Let us know if there is anything that we can do to make your therapy better!      What to do if you need assistance after hours or on the weekends:  CALL [phone].  HOLD on the line, do not hang  up.  You will hear multiple messages but at the end you will be connected with a nurse triage line.  They will contact the doctor if necessary.  Most of the time they will be able to assist you.  Do not call the hospital operator.      I have been informed and understand all of the instructions given to me and have received a copy. I have been instructed to call the clinic [phone] or my family physician as soon as possible for continued medical care, if indicated. I do not have any more questions at this time but understand that I may call the [facility] or the Patient Navigator at [phone] during office hours should I have questions or need assistance in obtaining follow-up care.

## 2018-01-15 NOTE — Progress Notes (Signed)
[facility] Psychosocial Distress Screening Clinical Social Work  Clinical Social Work was referred by distress screening protocol.  The patient scored a 5 on the Psychosocial Distress Thermometer which indicates moderate distress. Clinical Social Worker Edwyna Shell to assess for distress and other psychosocial needs. CSW spoke w patient's primary contact, Reyne Dumas, worker w [employer] [facility] [employer].  Mr Doree Fudge wants to be made aware of appointments and will help patient schedule rides through [facility].  Patient has little contact w family, Mr Doree Fudge is his primary support.  Patient lives in his own condo and wants to remain there as long as possible.  Spoke w patient who states he is doing "pretty well", aware of appointments and transportation arrangements.  CSW will stand by to assist as needed for resources and support.    ONCBCN DISTRESS SCREENING 01/14/2018  Screening Type Initial Screening  Distress experienced in past week (1-10) 5  Practical problem type Transportation  Emotional problem type Depression;Adjusting to illness;Boredom  Physical Problem type Pain;Sleep/insomnia;Constipation/diarrhea  Physician notified of physical symptoms Yes  Referral to clinical social work Yes  Referral to dietition Yes    Clinical Social Worker follow up needed: Yes.    If yes, follow up plan:  Monitor patient during treatment to assess and link to resources as needed.  Edwyna Shell, LCSW Clinical Social Worker Phone:  [phone]

## 2018-01-19 ENCOUNTER — Inpatient Hospital Stay (HOSPITAL_COMMUNITY): Payer: Medicare Other

## 2018-01-19 DIAGNOSIS — C799 Secondary malignant neoplasm of unspecified site: Secondary | ICD-10-CM

## 2018-01-19 DIAGNOSIS — IMO0002 Reserved for concepts with insufficient information to code with codable children: Secondary | ICD-10-CM

## 2018-01-19 NOTE — Progress Notes (Signed)
Chemotherapy teaching completed.  Extensive teaching packet given.  Consent signed.  Mr Shawn [name] social work from [facility] Dept was present.

## 2018-01-23 ENCOUNTER — Encounter (HOSPITAL_COMMUNITY): Payer: Self-pay

## 2018-01-27 ENCOUNTER — Inpatient Hospital Stay (HOSPITAL_COMMUNITY): Payer: Medicare Other | Admitting: Dietician

## 2018-01-27 ENCOUNTER — Encounter (HOSPITAL_COMMUNITY): Payer: Self-pay | Admitting: General Practice

## 2018-01-27 ENCOUNTER — Ambulatory Visit (INDEPENDENT_AMBULATORY_CARE_PROVIDER_SITE_OTHER): Payer: Medicare Other | Admitting: General Surgery

## 2018-01-27 ENCOUNTER — Encounter: Payer: Self-pay | Admitting: General Surgery

## 2018-01-27 VITALS — BP 100/55 | HR 58 | Temp 97.3°F | Resp 20 | Wt 142.0 lb

## 2018-01-27 DIAGNOSIS — C799 Secondary malignant neoplasm of unspecified site: Secondary | ICD-10-CM | POA: Diagnosis not present

## 2018-01-27 NOTE — Progress Notes (Signed)
[facility] Progress Note  Nutritionist requested CSW consult for patient re access to nutritional supplements in community.  CSW met w patient and community support ([place]).  Per worker, patient had episode of fecal incontinence after taking medications improperly.  Was unable to care for himself after this, worker has noted that patient is struggling to manage his current medication regimen as well as cope w ADLs.  Lives by himself in subsidized apartment.  Patient has no transportation - must walk to [facility] bus stop.  Has pain from cancer in bones/spine that makes walking any distances painful.  Has Medicare, does not qualify for Medicaid at this time.  Has struggled to afford needed non-covered items like nutritional supplements.  Appears thin and frail during interview.  Per Mr Vianne Bulls (w [facility] social work) is hoping to get patient into SNF for medications management and other support.  As patient has not had a qualifying inpatient stay and does not have Medicaid/is not bed bound at this time, undersigned wonders whether [facility] CSW is recommending assisted living placement which would be paid for out of patient's retirement check.  CSW has left VM for CSW Ritchie requesting call back for more information.  CSW discussed options for obtaining nutritional supplements in the community - recommended connecting w [facility] as they may be able to assist w supplements and possibly transportation.  CSW will continue to work w community supports to help patient connect w appropriate level of home support for optimal function.  Edwyna Shell, LCSW Clinical Social Worker Phone:  [phone] or [phone]

## 2018-01-27 NOTE — H&P (Signed)
Shawn [name]; [id number]; [birth year]   HPI Patient is a 67 year old white male who was referred to my care by Dr. Delton Coombes for placement of a Port-A-Cath.  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  He needs central venous access.  He currently has no pain. Past Medical History:  Diagnosis Date  . Alcoholism ([place])    "Dry" since 2002; relapse 2014  . Allergy   . Anemia   . Anxiety   . Anxiety and depression   . Bipolar disorder [facility])    Has had multiple psych hospitalization in the past, most recently at [facility] 01/31/11-02/04/11.  Says meds made him emotionally blunted. (lamictal and wellbutrin).  . Cancer ([place])    skin  . CHF (congestive heart failure) ([facility] [facility])   . Chronic kidney disease   . Depression   . Diverticular disease    "itis" x 2 episodes (last was about 2007)  . Fracture of metatarsal bone(s), closed    3rd, 4th, 5th mid shaft on left foot  . History of septic shock 11/2015   ? due to small bowel ischemia: hospitalized + surgeries 11/2015  . History of substance abuse    cocaine, marijuana, acid, alcohol (in remission since 2010)  . Mass    right neck  . Multiple rib fractures 10/09/10   s/p fall down a ravine--9th and 10th on right, contusion of left 7th and 8th ribs  . Osteoarthritis X years   left knee.  Distant hx (age 82) "dislocated" knee and required surgery, then crush injury to patella required knee cap removal.  . Osteoporosis   . Solar dermatitis    face and ears  . Substance abuse ([place])   . Tobacco dependence   . Vitamin B12 deficiency 10/2013   IF ab positive.  Replacement therapy started end of March 2015    Past Surgical History:  Procedure Laterality Date  . BOWEL RESECTION  06/15/2012   Procedure: SMALL BOWEL RESECTION;  Surgeon: Edward Jolly, MD;  Location: [facility];  Service: General;  Laterality: N/A;  . BOWEL RESECTION  11/19/2015   Procedure: SMALL BOWEL RESECTION;  Surgeon: Ralene Ok, MD;   Location: [facility];  Service: General;;  . CARDIAC CATHETERIZATION N/A 11/20/2015   Procedure: Temporary Wire;  Surgeon: Evans Lance, MD;  Location: [facility];  Service: Cardiovascular;  Laterality: N/A;  . DIRECT LARYNGOSCOPY Right 01/02/2018   Procedure: DIRECT LARYNGOSCOPY;  Surgeon: Leta Baptist, MD;  Location: [facility];  Service: ENT;  Laterality: Right;  . ESOPHAGOGASTRODUODENOSCOPY (EGD) WITH PROPOFOL N/A 04/17/2017   Procedure: ESOPHAGOGASTRODUODENOSCOPY (EGD) WITH PROPOFOL;  Surgeon: Daneil Dolin, MD;  [facility];  Service: Endoscopy;  Laterality: N/A;  . FEMUR IM NAIL  08/06/2012   Procedure: INTRAMEDULLARY (IM) NAIL FEMORAL;  Surgeon: Johnn Hai, MD;  [facility];  Service: Orthopedics;  Laterality: Left;  . HERNIA REPAIR     at 67 years of age  71  . INGUINAL HERNIA REPAIR     left  . INGUINAL HERNIA REPAIR  06/15/2012   Procedure: HERNIA REPAIR INGUINAL INCARCERATED;  Surgeon: Edward Jolly, MD;  [facility];  Service: General;  Laterality: Right;  . JOINT REPLACEMENT    . KNEE DISLOCATION SURGERY  1966  . LAPAROTOMY N/A 11/17/2015   Procedure: EXPLORATORY LAPAROTOMY;  Surgeon: Donnie Mesa, MD;  Location: [facility];  Service: General;  Laterality: N/A;  . LAPAROTOMY N/A 11/19/2015   Procedure: EXPLORATORY  LAPAROTOMY OPEN ABDOMEN ABDOMINAL WOUND VAC CHANGE;  Surgeon: Ralene Ok, MD;  Location: [facility];  Service: General;  Laterality: N/A;  . MASS BIOPSY Right 01/02/2018   Procedure: POSSIBLE NECK MASS BIOPSY;  Surgeon: Leta Baptist, MD;  Location: [facility];  Service: ENT;  Laterality: Right;  . PATELLECTOMY  1968   s/p crush injury  . SMALL BOWEL REPAIR N/A 11/21/2015   Procedure: SMALL BOWEL REPAIR, SMALL BOWEL ANATAMOSIS AND ABDOMINAL CLOSURE;  Surgeon: Coralie Keens, MD;  Location: [facility];  Service: General;  Laterality: N/A;  . TONGUE BIOPSY Right 01/02/2018   Procedure: TONGUE BIOPSY WITH FROZEN SECTION;  Surgeon: Leta Baptist, MD;  Location: [facility];   Service: ENT;  Laterality: Right;  . TOTAL KNEE ARTHROPLASTY  11/20/2011   Procedure: TOTAL KNEE ARTHROPLASTY;  Surgeon: Ninetta Lights, MD;  Location: [facility];  Service: Orthopedics;  Laterality: Left;  DR Percell Miller WANTS 90MINUTES FOR THIS CASE    Family History  Problem Relation Age of Onset  . Arthritis Mother   . Heart disease Mother   . Arthritis Father   . Alcohol abuse Father   . Cancer Brother        lung cancer.  Died in early 69s.  . Alcohol abuse Son   . Drug abuse Son   . Early death Brother        trauma  . Drug abuse Brother   . Colon cancer Neg Hx     Current Outpatient Medications on File Prior to Visit  Medication Sig Dispense Refill  . albuterol (PROVENTIL) (2.5 MG/3ML) 0.083% nebulizer solution Take 2.5 mg by nebulization every 2 (two) hours as needed for shortness of breath.    . Fluticasone-Salmeterol (ADVAIR) 100-50 MCG/DOSE AEPB Inhale 1 puff into the lungs 2 (two) times daily.    . meclizine (ANTIVERT) 12.5 MG tablet Take 12.5 mg by mouth 3 (three) times daily as needed for dizziness.    . prochlorperazine (COMPAZINE) 10 MG tablet Take 1 tablet (10 mg total) by mouth every 6 (six) hours as needed for nausea or vomiting. 60 tablet 3  . tamsulosin (FLOMAX) 0.4 MG CAPS capsule Take 0.4 mg by mouth.    . CARBOPLATIN IV Inject into the vein.    . Cetuximab (ERBITUX IV) Inject into the vein.    Marland Kitchen dexamethasone (DECADRON) 4 MG tablet Take 1 tablet (4 mg total) by mouth 2 (two) times daily with a meal. 30 tablet 0  . docusate sodium (COLACE) 100 MG capsule Take 100 mg by mouth 2 (two) times daily.    Marland Kitchen HYDROcodone-acetaminophen (NORCO) 7.5-325 MG tablet Take 1 tablet by mouth every 6 (six) hours as needed for moderate pain. 60 tablet 0  . lidocaine-prilocaine (EMLA) cream Apply to affected area once 30 g 3  . PACLitaxel (TAXOL IV) Inject into the vein.    . pegfilgrastim-cbqv (UDENYCA) 6 MG/0.6ML injection Inject 6 mg into the skin once.     No current  facility-administered medications on file prior to visit.     Allergies  Allergen Reactions  . Depakote [Divalproex Sodium] Other (See Comments)    "Makes me crazy"  . Codeine Nausea Only and Other (See Comments)    flushing    Social History   Substance and Sexual Activity  Alcohol Use No   Comment: former etoh abuse " years ago"    Social History   Tobacco Use  Smoking Status Current Every Day Smoker  . Packs/day: 0.50  . Years: 30.00  .  Pack years: 15.00  . Types: Cigarettes  . Start date: 09/02/1966  Smokeless Tobacco Never Used  Tobacco Comment   not ready yet but would like to quit smoking    Review of Systems  Constitutional: Positive for malaise/fatigue.  HENT: Negative.   Eyes: Positive for pain.  Respiratory: Negative.   Cardiovascular: Negative.   Gastrointestinal: Negative.   Genitourinary: Negative.   Musculoskeletal: Positive for back pain and joint pain.  Skin: Negative.   Neurological: Negative.   Endo/Heme/Allergies: Negative.   Psychiatric/Behavioral: Negative.     Objective   Vitals:   01/27/18 1419  BP: (!) 100/55  Pulse: (!) 58  Resp: 20  Temp: (!) 97.3 F (36.3 C)    Physical Exam  Constitutional: He is oriented to person, place, and time. He appears well-developed.  Cachectic  HENT:  Head: Normocephalic and atraumatic.  Cardiovascular: Normal rate, regular rhythm and normal heart sounds. Exam reveals no gallop and no friction rub.  No murmur heard. Pulmonary/Chest: Effort normal and breath sounds normal. No stridor. No respiratory distress. He has no wheezes. He has no rales.  Neurological: He is alert and oriented to person, place, and time.  Skin: Skin is warm and dry.  Vitals reviewed.  Oncology notes reviewed Assessment  Metastatic squamous cell carcinoma Plan   Patient is scheduled for Port-A-Cath insertion on 01/30/2018.  The risks and benefits of the procedure bleeding, infection, and pneumothorax were fully  explained to the patient, who gave informed consent.

## 2018-01-27 NOTE — Progress Notes (Addendum)
Nutrition Assessment  Reason for Assessment: Initial assessment. Wt loss. Advanced H/N cancer.   ASSESSMENT:  67 y/o male PMHx Afib, Anxiety, HF, depression, prior ETOH/tob/substance abuse, bipolar disorder, heart block, CKD,  Suspected self-neglect, followed by social services.  Pt found in May to have large neck mass. CT showed likely cancer. PET 5/13 revealed significant metastases to bones, soft tissue neck, liver, lymph nodes and potentially lung. Biopsy of R tongue  +SCC.  Patient has elected for palliative tx.   Pt seen with social worker today, Sherlean Foot. SW apparently is payee for pts disability benefits/utilities/rent etc. He also helps with medical appts, transportation and is primary caretaker for pt. Most info obtained from SW. Pt is poor historian.   Pt is of low functioning status. He uses cane and is able to shop and prepare simple meals (frozen/canned meals/items). Appears to have some difficulty with ADLs -SW notes time pt  found to have diarrhea all over BR.He needs help with his meds.  SW says that the patient "took a whole bottle of meclizine" and says the pt "thought it was pain medication". RD asked in a non-direct fashion if there was SI. SW did not believe so.   Nutritionally, Patient says he eats "as I can". Unable to determine what he meant by this, thought to mean he has a poor appetite. Did not quantify meals. He has significant pain that limits sleep. He does not take vitamins. He has been drinking Boost, provided by SW. He drinks mostly water,  8-10 cups/day. He doesnt like milk.   Other than one episode of diarrhea, pt denies any v/c/d. Has mild nausea  Wt wise, patient reports a UBW of 185. He says he believes he has lost "atleast 40 lbs in a year, maybe more". Per chart, his wt loss has not been as severe. Several weights of 180 noted, but these appear to be reported. Measured wts show loss of 16 lbs since last June, 13 lbs since last October and about 5 lbs since  March. This is not clinically significant.   SW says he has been in contact with social work (tracy?) to try to see if patient can be placed in ALF.   NUTRITION - FOCUSED PHYSICAL EXAM: Patient is emaciated-patient was not weighed today, suspect he has lost much more weight in last 2 wks that has just not been revealed yet. He shows severe fat and muscle wasting of all accessible areas. CW comments "he is so small you can see his entire bone structure".   MEDICATIONS:  Supportive meds: meclizine, compazine, colace, decadron, Norco.  Anticipated chemo regimen: Cetuximab, Paclitaxel, Carboplatin   LABS: No recent labs.   Wt Readings from Last 10 Encounters:  01/27/18 145 lb 14.4 oz (66.2 kg)  01/14/18 145 lb 14.4 oz (66.2 kg)  01/02/18 151 lb 14.4 oz (68.9 kg)  11/10/17 148 lb 12 oz (67.5 kg)  10/14/17 180 lb (81.6 kg)  07/08/17 157 lb (71.2 kg)  06/20/17 157 lb 1.3 oz (71.3 kg)  06/18/17 170 lb (77.1 kg)  04/17/17 157 lb 8 oz (71.4 kg)  02/21/17 162 lb 1.9 oz (73.5 kg)   ANTHROPOMETRICS: Height: 5\' 9"  (175.3 cm) Weight: 145 lb 14.4 oz (66.2 kg) Body mass index is 21.55 kg/m. UBW: 185 lbs (reported) IBW:72.73 kg  ESTIMATED ENERGY NEEDS (for tx):  Kcal: 8657-8469 (35-37 kcal/kg bw)  Protein:99-113g Pro (1.2-.17g/kg bw) Fluid: >2 Liters fluid   NUTRITION DIAGNOSIS:  Severe Malnutrition related to cancer and multitude  of underlying comorbidities as evidenced by severe muscle/fat depletion  DOCUMENTATION CODES:  Severe malnutrition in context of chronic illness  INTERVENTION:  RD explained how it is expected that pt will have a poor appetite as it is part of disease process. This does NOT mean patient does not need to eat. He should eat small, frequent meals and sip mostly on calorie containing beverages throughout day. If symptoms make this difficult (nausea, pain, diarrhea, fatigue etc) than will try to address those barriers.   Reviewed handout titled "Increasing Calories  and Protein". Given patient's functional status, RD went through and highlighted the simplest items to prepare ie boiled eggs, cheese toast, pb sandwiches, tuna etc.   RD went over the "Big Three" of cheese, Peanut butter and eggs which are some of the most common foods that are high in both kcals and protein.   Patient is mostly consuming water. He would benefit from consuming calorie containing beverages that are also hydrating such as low sugar sports drink or milk. Patient says he is lactose intolerant. RD recommended lactaid  RD reccommended protein powder and taught how to use it.  Slight amounts of Unflavored whey protein powder should be added to liquids and sprinkled on solid items. SHould add little enough that it does not make the food/drink less palatable. Explained this may only add 20-30 kcals and a few grams of protein, but this will add up over time.   Pt did not have any questions, said relatively little during encounter.  Reviewed the Ensure assistance program. When asked, RD stated pt would need 6-7/day to meet needs w/ ONLY ensure. He asks for further assistance. Reviewed policy of only giving 1 case at time. He is given first case of Ensure today.   RD comments SW may know of other resources pt is applicable for. SW asks for impromptu meeting w/ [facility] SW. RD passed on request to SW who graciouslly saw pt.   Pt frequently got up and said he had trouble sitting due to pain. RD inquired about sleep. He says he cannot sleep well due to pain. He says he needs a med for sleep. RD noted concern to MD.   Patient is severely malnourished and of quite low functional status. He has essentially no muscle/fat reserves. He is at extremely high risk for quick decline. Will follow closely.   GOAL:  Patient will meet greater than or equal to 90% of their needs, wt stability  MONITOR:  PO intake, Labs, Weight trends, chemotherapy tolerance, goals of care  Next Visit: 1 week  Burtis Junes  RD, LDN, CNSC Clinical Nutrition Available Tues-Sat via Pager: [phone] 01/27/2018 2:12 PM

## 2018-01-27 NOTE — Patient Instructions (Signed)
Implanted Port Insertion  Implanted port insertion is a procedure to put in a port and catheter. The port is a device with an injectable disk that can be accessed by your health care provider. The port is connected to a vein in the chest or neck by a small flexible tube (catheter). There are different types of ports. The implanted port may be used as a long-term IV access for:  · Medicines, such as chemotherapy.  · Fluids.  · Liquid nutrition, such as total parenteral nutrition (TPN).  · Blood samples.    Having a port means that your health care provider will not need to use the veins in your arms for these procedures.  Tell a health care provider about:  · Any allergies you have.  · All medicines you are taking, especially blood thinners, as well as any vitamins, herbs, eye drops, creams, over-the-counter medicines, and steroids.  · Any problems you or family members have had with anesthetic medicines.  · Any blood disorders you have.  · Any surgeries you have had.  · Any medical conditions you have, including diabetes or kidney problems.  · Whether you are pregnant or may be pregnant.  What are the risks?  Generally, this is a safe procedure. However, problems may occur, including:  · Allergic reactions to medicines or dyes.  · Damage to other structures or organs.  · Infection.  · Damage to the blood vessel, bruising, or bleeding at the puncture site.  · Blood clot.  · Breakdown of the skin over the port.  · A collection of air in the chest that can cause one of the lungs to collapse (pneumothorax). This is rare.    What happens before the procedure?  Staying hydrated  Follow instructions from your health care provider about hydration, which may include:  · Up to 2 hours before the procedure - you may continue to drink clear liquids, such as water, clear fruit juice, black coffee, and plain tea.    Eating and drinking restrictions  · Follow instructions from your health care provider about eating and drinking,  which may include:  ? 8 hours before the procedure - stop eating heavy meals or foods such as meat, fried foods, or fatty foods.  ? 6 hours before the procedure - stop eating light meals or foods, such as toast or cereal.  ? 6 hours before the procedure - stop drinking milk or drinks that contain milk.  ? 2 hours before the procedure - stop drinking clear liquids.  Medicines  · Ask your health care provider about:  ? Changing or stopping your regular medicines. This is especially important if you are taking diabetes medicines or blood thinners.  ? Taking medicines such as aspirin and ibuprofen. These medicines can thin your blood. Do not take these medicines before your procedure if your health care provider instructs you not to.  · You may be given antibiotic medicine to help prevent infection.  General instructions  · Plan to have someone take you home from the hospital or clinic.  · If you will be going home right after the procedure, plan to have someone with you for 24 hours.  · You may have blood tests.  · You may be asked to shower with a germ-killing soap.  What happens during the procedure?  · To lower your risk of infection:  ? Your health care team will wash or sanitize their hands.  ? Your skin will be washed with   soap.  ? Hair may be removed from the surgical area.  · An IV tube will be inserted into one of your veins.  · You will be given one or more of the following:  ? A medicine to help you relax (sedative).  ? A medicine to numb the area (local anesthetic).  · Two small cuts (incisions) will be made to insert the port.  ? One incision will be made in your neck to get access to the vein where the catheter will lie.  ? The other incision will be made in the upper chest. This is where the port will lie.  · The procedure may be done using continuous X-ray (fluoroscopy) or other imaging tools for guidance.  · The port and catheter will be placed. There may be a small, raised area where the port  is.  · The port will be flushed with a salt solution (saline), and blood will be drawn to make sure that it is working correctly.  · The incisions will be closed.  · Bandages (dressings) may be placed over the incisions.  The procedure may vary among health care providers and hospitals.  What happens after the procedure?  · Your blood pressure, heart rate, breathing rate, and blood oxygen level will be monitored until the medicines you were given have worn off.  · Do not drive for 24 hours if you were given a sedative.  · You will be given a manufacturer's information card for the type of port that you have. Keep this with you.  · Your port will need to be flushed and checked as told by your health care provider, usually every few weeks.  · A chest X-ray will be done to:  ? Check the placement of the port.  ? Make sure there is no injury to your lung.  Summary  · Implanted port insertion is a procedure to put in a port and catheter.  · The implanted port is used as a long-term IV access.  · The port will need to be flushed and checked as told by your health care provider, usually every few weeks.  · Keep your manufacturer's information card with you at all times.  This information is not intended to replace advice given to you by your health care provider. Make sure you discuss any questions you have with your health care provider.  Document Released: 06/09/2013 Document Revised: 07/10/2016 Document Reviewed: 07/10/2016  Elsevier Interactive Patient Education © 2017 Elsevier Inc.

## 2018-01-27 NOTE — Progress Notes (Signed)
Shawn [name]; [id number]; 06-26-1951   HPI Patient is a 67 year old white male who was referred to my care by Dr. Delton Coombes for placement of a Port-A-Cath.  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  He needs central venous access.  He currently has no pain. Past Medical History:  Diagnosis Date  . Alcoholism ([place])    "Dry" since 2002; relapse 2014  . Allergy   . Anemia   . Anxiety   . Anxiety and depression   . Bipolar disorder [facility])    Has had multiple psych hospitalization in the past, most recently at [place] center 01/31/11-02/04/11.  Says meds made him emotionally blunted. (lamictal and wellbutrin).  . Cancer ([place])    skin  . CHF (congestive heart failure) ([place])   . Chronic kidney disease   . Depression   . Diverticular disease    "itis" x 2 episodes (last was about 2007)  . Fracture of metatarsal bone(s), closed    3rd, 4th, 5th mid shaft on left foot  . History of septic shock 11/2015   ? due to small bowel ischemia: hospitalized + surgeries 11/2015  . History of substance abuse    cocaine, marijuana, acid, alcohol (in remission since 2010)  . Mass    right neck  . Multiple rib fractures 10/09/10   s/p fall down a ravine--9th and 10th on right, contusion of left 7th and 8th ribs  . Osteoarthritis X years   left knee.  Distant hx (age 89) "dislocated" knee and required surgery, then crush injury to patella required knee cap removal.  . Osteoporosis   . Solar dermatitis    face and ears  . Substance abuse ([place])   . Tobacco dependence   . Vitamin B12 deficiency 10/2013   IF ab positive.  Replacement therapy started end of March 2015    Past Surgical History:  Procedure Laterality Date  . BOWEL RESECTION  06/15/2012   Procedure: SMALL BOWEL RESECTION;  Surgeon: Edward Jolly, MD;  Location: [facility];  Service: General;  Laterality: N/A;  . BOWEL RESECTION  11/19/2015   Procedure: SMALL BOWEL RESECTION;  Surgeon: Ralene Ok, MD;   Location: [facility];  Service: General;;  . CARDIAC CATHETERIZATION N/A 11/20/2015   Procedure: Temporary Wire;  Surgeon: Evans Lance, MD;  Location: [facility] CV LAB;  Service: Cardiovascular;  Laterality: N/A;  . DIRECT LARYNGOSCOPY Right 01/02/2018   Procedure: DIRECT LARYNGOSCOPY;  Surgeon: Leta Baptist, MD;  Location: [facility];  Service: ENT;  Laterality: Right;  . ESOPHAGOGASTRODUODENOSCOPY (EGD) WITH PROPOFOL N/A 04/17/2017   Procedure: ESOPHAGOGASTRODUODENOSCOPY (EGD) WITH PROPOFOL;  Surgeon: Daneil Dolin, MD;  [facility];  Service: Endoscopy;  Laterality: N/A;  . FEMUR IM NAIL  08/06/2012   Procedure: INTRAMEDULLARY (IM) NAIL FEMORAL;  Surgeon: Johnn Hai, MD;  [facility];  Service: Orthopedics;  Laterality: Left;  . HERNIA REPAIR     at 67 years of age  84  . INGUINAL HERNIA REPAIR     left  . INGUINAL HERNIA REPAIR  06/15/2012   Procedure: HERNIA REPAIR INGUINAL INCARCERATED;  Surgeon: Edward Jolly, MD;  [facility];  Service: General;  Laterality: Right;  . JOINT REPLACEMENT    . KNEE DISLOCATION SURGERY  1966  . LAPAROTOMY N/A 11/17/2015   Procedure: EXPLORATORY LAPAROTOMY;  Surgeon: Donnie Mesa, MD;  Location: [facility];  Service: General;  Laterality: N/A;  . LAPAROTOMY N/A 11/19/2015   Procedure: EXPLORATORY  LAPAROTOMY OPEN ABDOMEN ABDOMINAL WOUND VAC CHANGE;  Surgeon: Ralene Ok, MD;  Location: [facility];  Service: General;  Laterality: N/A;  . MASS BIOPSY Right 01/02/2018   Procedure: POSSIBLE NECK MASS BIOPSY;  Surgeon: Leta Baptist, MD;  Location: [facility];  Service: ENT;  Laterality: Right;  . PATELLECTOMY  1968   s/p crush injury  . SMALL BOWEL REPAIR N/A 11/21/2015   Procedure: SMALL BOWEL REPAIR, SMALL BOWEL ANATAMOSIS AND ABDOMINAL CLOSURE;  Surgeon: Coralie Keens, MD;  Location: [facility];  Service: General;  Laterality: N/A;  . TONGUE BIOPSY Right 01/02/2018   Procedure: TONGUE BIOPSY WITH FROZEN SECTION;  Surgeon: Leta Baptist, MD;  Location: [facility];   Service: ENT;  Laterality: Right;  . TOTAL KNEE ARTHROPLASTY  11/20/2011   Procedure: TOTAL KNEE ARTHROPLASTY;  Surgeon: Ninetta Lights, MD;  Location: [facility];  Service: Orthopedics;  Laterality: Left;  DR Percell Miller WANTS 90MINUTES FOR THIS CASE    Family History  Problem Relation Age of Onset  . Arthritis Mother   . Heart disease Mother   . Arthritis Father   . Alcohol abuse Father   . Cancer Brother        lung cancer.  Died in early 42s.  . Alcohol abuse Son   . Drug abuse Son   . Early death Brother        trauma  . Drug abuse Brother   . Colon cancer Neg Hx     Current Outpatient Medications on File Prior to Visit  Medication Sig Dispense Refill  . albuterol (PROVENTIL) (2.5 MG/3ML) 0.083% nebulizer solution Take 2.5 mg by nebulization every 2 (two) hours as needed for shortness of breath.    . Fluticasone-Salmeterol (ADVAIR) 100-50 MCG/DOSE AEPB Inhale 1 puff into the lungs 2 (two) times daily.    . meclizine (ANTIVERT) 12.5 MG tablet Take 12.5 mg by mouth 3 (three) times daily as needed for dizziness.    . prochlorperazine (COMPAZINE) 10 MG tablet Take 1 tablet (10 mg total) by mouth every 6 (six) hours as needed for nausea or vomiting. 60 tablet 3  . tamsulosin (FLOMAX) 0.4 MG CAPS capsule Take 0.4 mg by mouth.    . CARBOPLATIN IV Inject into the vein.    . Cetuximab (ERBITUX IV) Inject into the vein.    Marland Kitchen dexamethasone (DECADRON) 4 MG tablet Take 1 tablet (4 mg total) by mouth 2 (two) times daily with a meal. 30 tablet 0  . docusate sodium (COLACE) 100 MG capsule Take 100 mg by mouth 2 (two) times daily.    Marland Kitchen HYDROcodone-acetaminophen (NORCO) 7.5-325 MG tablet Take 1 tablet by mouth every 6 (six) hours as needed for moderate pain. 60 tablet 0  . lidocaine-prilocaine (EMLA) cream Apply to affected area once 30 g 3  . PACLitaxel (TAXOL IV) Inject into the vein.    . pegfilgrastim-cbqv (UDENYCA) 6 MG/0.6ML injection Inject 6 mg into the skin once.     No current  facility-administered medications on file prior to visit.     Allergies  Allergen Reactions  . Depakote [Divalproex Sodium] Other (See Comments)    "Makes me crazy"  . Codeine Nausea Only and Other (See Comments)    flushing    Social History   Substance and Sexual Activity  Alcohol Use No   Comment: former etoh abuse " years ago"    Social History   Tobacco Use  Smoking Status Current Every Day Smoker  . Packs/day: 0.50  . Years: 30.00  .  Pack years: 15.00  . Types: Cigarettes  . Start date: 09/02/1966  Smokeless Tobacco Never Used  Tobacco Comment   not ready yet but would like to quit smoking    Review of Systems  Constitutional: Positive for malaise/fatigue.  HENT: Negative.   Eyes: Positive for pain.  Respiratory: Negative.   Cardiovascular: Negative.   Gastrointestinal: Negative.   Genitourinary: Negative.   Musculoskeletal: Positive for back pain and joint pain.  Skin: Negative.   Neurological: Negative.   Endo/Heme/Allergies: Negative.   Psychiatric/Behavioral: Negative.     Objective   Vitals:   01/27/18 1419  BP: (!) 100/55  Pulse: (!) 58  Resp: 20  Temp: (!) 97.3 F (36.3 C)    Physical Exam  Constitutional: He is oriented to person, place, and time. He appears well-developed.  Cachectic  HENT:  Head: Normocephalic and atraumatic.  Cardiovascular: Normal rate, regular rhythm and normal heart sounds. Exam reveals no gallop and no friction rub.  No murmur heard. Pulmonary/Chest: Effort normal and breath sounds normal. No stridor. No respiratory distress. He has no wheezes. He has no rales.  Neurological: He is alert and oriented to person, place, and time.  Skin: Skin is warm and dry.  Vitals reviewed.  Oncology notes reviewed Assessment  Metastatic squamous cell carcinoma Plan   Patient is scheduled for Port-A-Cath insertion on 01/30/2018.  The risks and benefits of the procedure bleeding, infection, and pneumothorax were fully  explained to the patient, who gave informed consent.

## 2018-01-28 ENCOUNTER — Other Ambulatory Visit (HOSPITAL_COMMUNITY): Payer: Self-pay

## 2018-01-28 DIAGNOSIS — G893 Neoplasm related pain (acute) (chronic): Secondary | ICD-10-CM

## 2018-01-28 MED ORDER — DEXAMETHASONE 4 MG PO TABS: 4.0000 mg | ORAL_TABLET | Freq: Two times a day (BID) | ORAL | 2 refills | Status: AC

## 2018-01-28 NOTE — Telephone Encounter (Signed)
Refill request for Dexamethasone tablet from [place].  Reviewed with provider and ok to refill.

## 2018-01-29 ENCOUNTER — Encounter (HOSPITAL_COMMUNITY): Payer: Self-pay | Admitting: Anesthesiology

## 2018-01-29 ENCOUNTER — Encounter (HOSPITAL_COMMUNITY)
Admission: RE | Admit: 2018-01-29 | Discharge: 2018-01-29 | Disposition: A | Discharge status: Home / Self Care | Payer: Medicare Other | Source: Ambulatory Visit | Attending: General Surgery | Admitting: General Surgery

## 2018-01-29 NOTE — Progress Notes (Signed)
Dr Arnoldo Morale notified case worker can transport Mr Shawn [name] but he has no family to stay with him after surgery.  OK to admit for observation post procedure per Dr Arnoldo Morale

## 2018-01-30 ENCOUNTER — Other Ambulatory Visit (HOSPITAL_COMMUNITY): Payer: Self-pay | Admitting: Pharmacist

## 2018-01-30 ENCOUNTER — Encounter (HOSPITAL_COMMUNITY): Admission: RE | Payer: Self-pay | Source: Ambulatory Visit

## 2018-01-30 ENCOUNTER — Ambulatory Visit (HOSPITAL_COMMUNITY): Admission: RE | Admit: 2018-01-30 | Payer: Medicare Other | Source: Ambulatory Visit | Admitting: General Surgery

## 2018-01-30 SURGERY — INSERTION, TUNNELED CENTRAL VENOUS DEVICE, WITH PORT
Anesthesia: Monitor Anesthesia Care

## 2018-01-30 MED ORDER — LIDOCAINE HCL (PF) 1 % IJ SOLN
INTRAMUSCULAR | Status: AC
  Filled 2018-01-30: qty 30

## 2018-01-30 MED ORDER — HEPARIN SOD (PORK) LOCK FLUSH 100 UNIT/ML IV SOLN
INTRAVENOUS | Status: AC
  Filled 2018-01-30: qty 5

## 2018-01-30 NOTE — Progress Notes (Signed)
Shawn [name] with [facility] [employer] [facility] called Short Stay advising that patient is admitted to [facility] Room 223. OR scheduler notified. Mr. Doree Fudge advised to call Dr. Arnoldo Morale office, he verbalized understanding.  Dr. Arnoldo Morale notified by Charge Nurse Theodora Blow

## 2018-02-03 ENCOUNTER — Encounter (HOSPITAL_COMMUNITY): Payer: Self-pay | Admitting: Dietician

## 2018-02-03 ENCOUNTER — Encounter (HOSPITAL_COMMUNITY): Payer: Self-pay | Admitting: General Practice

## 2018-02-03 ENCOUNTER — Encounter: Payer: Self-pay | Admitting: General Practice

## 2018-02-03 ENCOUNTER — Other Ambulatory Visit (HOSPITAL_COMMUNITY): Payer: Self-pay

## 2018-02-03 NOTE — Progress Notes (Signed)
RD had planned to f/u with patient today following his lab draw. Patient did not show up for lab draw.   Per last note in chart, patient port cath insertion was cancelled due to admission to an OSH; may still be admitted there. Will monitor patient activity and reschedule as appropriate   Burtis Junes RD, LDN, CNSC Clinical Nutrition Available Tues-Sat via Pager: [phone] 02/03/2018 12:19 PM

## 2018-02-03 NOTE — Progress Notes (Signed)
[facility] CSW Progress Note  CSW spoke w Marko Stai, Doctor, hospital at [facility] unit manager [phone]).  Per unit manager, patient was admitted to rehab section of [facility] last night, w intention of becoming long term resident of the facility.  Unit manager states he will not be coming to [place] appointments at [facility]; however, they will be speaking w patient re his wishes re continuing cancer treatments.  If patient decides to continue treatment, facility's transportation coordinator will reach out to [facility] Lupita Raider RN) to coordinate appointments and arrangements for transport.  Transport will be provided by facility.  Edwyna Shell, LCSW Clinical Social Worker Phone:  [phone]

## 2018-02-03 NOTE — Progress Notes (Signed)
[facility] CSW Progress Notes  Call from Sherlean Foot, [facility] caseworker assigned to patient.  Per Mr Doree Fudge, patient went to [facility] due to pain, was admitted and is now in their rehab setting.  Caseworker hopes that patient will be admitted to [facility] as long term plan.  Patient has had difficulty managing pain and symptoms in the community setting, appears to require higher level of care to manage successfully.  Edwyna Shell, LCSW Clinical Social Worker Phone:  [phone]

## 2018-02-03 NOTE — Progress Notes (Signed)
[facility] CSW Progress Note  Patient noted to have been admitted to [facility] on 5/31 per previous note.  CSW left VM for Sherlean Foot, caseworker w [facility], awaiting call back. Spoke w Olivia Mackie, [place]. As patient has been admitted inpatient, home health is not directly involved at this time however home health staff are concerned about patient.  Previously, home health SW had been working on plan for patient to admit to either SNF or ALF, depending on available resources, needs, coverage.  As patient does not have Medicaid at this point, placement at assisted living facility would not be covered.  However, patient could choose to pay privately for placement. CSW called [facility] where patient was reportedly admitted - patient is now in the rehab section of the hospital.  Left VM requesting SNF/rehab CSW Chryl Heck to call back w update and plans for patient.    Edwyna Shell, LCSW Clinical Social Worker Phone:  [phone]

## 2018-02-04 ENCOUNTER — Ambulatory Visit (HOSPITAL_COMMUNITY): Payer: Self-pay

## 2018-02-06 ENCOUNTER — Ambulatory Visit (HOSPITAL_COMMUNITY): Payer: Self-pay

## 2018-02-09 ENCOUNTER — Ambulatory Visit (HOSPITAL_COMMUNITY): Payer: Self-pay

## 2018-02-11 ENCOUNTER — Ambulatory Visit (HOSPITAL_COMMUNITY): Payer: Self-pay | Admitting: Hematology

## 2018-02-11 ENCOUNTER — Ambulatory Visit (HOSPITAL_COMMUNITY): Payer: Self-pay

## 2018-02-16 ENCOUNTER — Ambulatory Visit (HOSPITAL_COMMUNITY): Payer: Self-pay | Admitting: Hematology

## 2018-02-16 NOTE — Patient Instructions (Signed)
Shawn [name]  02/16/2018     @PREFPERIOPPHARMACY @   Your procedure is scheduled on  02/20/2018 .  Report to [facility] at  835   A.M.  Call this number if you have problems the morning of surgery:  [phone]   Remember:  Do not eat or drink after midnight.  You may drink clear liquids until  12 midnight 02/19/2018 .  Clear liquids allowed are:                    Water, Juice (non-citric and without pulp), Carbonated beverages, Clear Tea, Black Coffee only, Plain Jell-O only, Gatorade and Plain Popsicles only    Take these medicines the morning of surgery with A SIP OF WATER  Decadron, hydrocodone, antivert, protonix, compazine, flomax. Use your nebulizer and your inhalers before you come. You may wear your Fentanyl patch.    Do not wear jewelry, make-up or nail polish.  Do not wear lotions, powders, or perfumes, or deodorant.  Do not shave 48 hours prior to surgery.  Men may shave face and neck.  Do not bring valuables to the hospital.  [facility] is not responsible for any belongings or valuables.  Contacts, dentures or bridgework may not be worn into surgery.  Leave your suitcase in the car.  After surgery it may be brought to your room.  For patients admitted to the hospital, discharge time will be determined by your treatment team.  Patients discharged the day of surgery will not be allowed to drive home.   Name and phone number of your driver:   family Special instructions:  None Please read over the following fact sheets that you were given. Anesthesia Post-op Instructions and Care and Recovery After Surgery       Implanted Port Insertion Implanted port insertion is a procedure to put in a port and catheter. The port is a device with an injectable disk that can be accessed by your health care provider. The port is connected to a vein in the chest or neck by a small flexible tube (catheter). There are different types of ports. The implanted port  may be used as a long-term IV access for:  Medicines, such as chemotherapy.  Fluids.  Liquid nutrition, such as total parenteral nutrition (TPN).  Blood samples.  Having a port means that your health care provider will not need to use the veins in your arms for these procedures. Tell a health care provider about:  Any allergies you have.  All medicines you are taking, especially blood thinners, as well as any vitamins, herbs, eye drops, creams, over-the-counter medicines, and steroids.  Any problems you or family members have had with anesthetic medicines.  Any blood disorders you have.  Any surgeries you have had.  Any medical conditions you have, including diabetes or kidney problems.  Whether you are pregnant or may be pregnant. What are the risks? Generally, this is a safe procedure. However, problems may occur, including:  Allergic reactions to medicines or dyes.  Damage to other structures or organs.  Infection.  Damage to the blood vessel, bruising, or bleeding at the puncture site.  Blood clot.  Breakdown of the skin over the port.  A collection of air in the chest that can cause one of the lungs to collapse (pneumothorax). This is rare.  What happens before the procedure? Staying hydrated Follow instructions from your health care provider about  hydration, which may include:  Up to 2 hours before the procedure - you may continue to drink clear liquids, such as water, clear fruit juice, black coffee, and plain tea.  Eating and drinking restrictions  Follow instructions from your health care provider about eating and drinking, which may include: ? 8 hours before the procedure - stop eating heavy meals or foods such as meat, fried foods, or fatty foods. ? 6 hours before the procedure - stop eating light meals or foods, such as toast or cereal. ? 6 hours before the procedure - stop drinking milk or drinks that contain milk. ? 2 hours before the procedure -  stop drinking clear liquids. Medicines  Ask your health care provider about: ? Changing or stopping your regular medicines. This is especially important if you are taking diabetes medicines or blood thinners. ? Taking medicines such as aspirin and ibuprofen. These medicines can thin your blood. Do not take these medicines before your procedure if your health care provider instructs you not to.  You may be given antibiotic medicine to help prevent infection. General instructions  Plan to have someone take you home from the hospital or clinic.  If you will be going home right after the procedure, plan to have someone with you for 24 hours.  You may have blood tests.  You may be asked to shower with a germ-killing soap. What happens during the procedure?  To lower your risk of infection: ? Your health care team will wash or sanitize their hands. ? Your skin will be washed with soap. ? Hair may be removed from the surgical area.  An IV tube will be inserted into one of your veins.  You will be given one or more of the following: ? A medicine to help you relax (sedative). ? A medicine to numb the area (local anesthetic).  Two small cuts (incisions) will be made to insert the port. ? One incision will be made in your neck to get access to the vein where the catheter will lie. ? The other incision will be made in the upper chest. This is where the port will lie.  The procedure may be done using continuous X-ray (fluoroscopy) or other imaging tools for guidance.  The port and catheter will be placed. There may be a small, raised area where the port is.  The port will be flushed with a salt solution (saline), and blood will be drawn to make sure that it is working correctly.  The incisions will be closed.  Bandages (dressings) may be placed over the incisions. The procedure may vary among health care providers and hospitals. What happens after the procedure?  Your blood pressure,  heart rate, breathing rate, and blood oxygen level will be monitored until the medicines you were given have worn off.  Do not drive for 24 hours if you were given a sedative.  You will be given a manufacturer's information card for the type of port that you have. Keep this with you.  Your port will need to be flushed and checked as told by your health care provider, usually every few weeks.  A chest X-ray will be done to: ? Check the placement of the port. ? Make sure there is no injury to your lung. Summary  Implanted port insertion is a procedure to put in a port and catheter.  The implanted port is used as a long-term IV access.  The port will need to be flushed and checked as  told by your health care provider, usually every few weeks.  Keep your manufacturer's information card with you at all times. This information is not intended to replace advice given to you by your health care provider. Make sure you discuss any questions you have with your health care provider. Document Released: 06/09/2013 Document Revised: 07/10/2016 Document Reviewed: 07/10/2016 Elsevier Interactive Patient Education  2017 [place] Insertion, Care After This sheet gives you information about how to care for yourself after your procedure. Your health care provider may also give you more specific instructions. If you have problems or questions, contact your health care provider. What can I expect after the procedure? After your procedure, it is common to have:  Discomfort at the port insertion site.  Bruising on the skin over the port. This should improve over 3-4 days.  Follow these instructions at home: [facility] care  After your port is placed, you will get a manufacturer's information card. The card has information about your port. Keep this card with you at all times.  Take care of the port as told by your health care provider. Ask your health care provider if you or a family  member can get training for taking care of the port at home. A home health care nurse may also take care of the port.  Make sure to remember what type of port you have. Incision care  Follow instructions from your health care provider about how to take care of your port insertion site. Make sure you: ? Wash your hands with soap and water before you change your bandage (dressing). If soap and water are not available, use hand sanitizer. ? Change your dressing as told by your health care provider. ? Leave stitches (sutures), skin glue, or adhesive strips in place. These skin closures may need to stay in place for 2 weeks or longer. If adhesive strip edges start to loosen and curl up, you may trim the loose edges. Do not remove adhesive strips completely unless your health care provider tells you to do that.  Check your port insertion site every day for signs of infection. Check for: ? More redness, swelling, or pain. ? More fluid or blood. ? Warmth. ? Pus or a bad smell. General instructions  Do not take baths, swim, or use a hot tub until your health care provider approves.  Do not lift anything that is heavier than 10 lb (4.5 kg) for a week, or as told by your health care provider.  Ask your health care provider when it is okay to: ? Return to work or school. ? Resume usual physical activities or sports.  Do not drive for 24 hours if you were given a medicine to help you relax (sedative).  Take over-the-counter and prescription medicines only as told by your health care provider.  Wear a medical alert bracelet in case of an emergency. This will tell any health care providers that you have a port.  Keep all follow-up visits as told by your health care provider. This is important. Contact a health care provider if:  You cannot flush your port with saline as directed, or you cannot draw blood from the port.  You have a fever or chills.  You have more redness, swelling, or pain  around your port insertion site.  You have more fluid or blood coming from your port insertion site.  Your port insertion site feels warm to the touch.  You have pus or a bad  smell coming from the port insertion site. Get help right away if:  You have chest pain or shortness of breath.  You have bleeding from your port that you cannot control. Summary  Take care of the port as told by your health care provider.  Change your dressing as told by your health care provider.  Keep all follow-up visits as told by your health care provider. This information is not intended to replace advice given to you by your health care provider. Make sure you discuss any questions you have with your health care provider. Document Released: 06/09/2013 Document Revised: 07/10/2016 Document Reviewed: 07/10/2016 Elsevier Interactive Patient Education  2017 [place] An implanted port is a type of central line that is placed under the skin. Central lines are used to provide IV access when treatment or nutrition needs to be given through a person's veins. Implanted ports are used for long-term IV access. An implanted port may be placed because:  You need IV medicine that would be irritating to the small veins in your hands or arms.  You need long-term IV medicines, such as antibiotics.  You need IV nutrition for a long period.  You need frequent blood draws for lab tests.  You need dialysis.  Implanted ports are usually placed in the chest area, but they can also be placed in the upper arm, the abdomen, or the leg. An implanted port has two main parts:  Reservoir. The reservoir is round and will appear as a small, raised area under your skin. The reservoir is the part where a needle is inserted to give medicines or draw blood.  Catheter. The catheter is a thin, flexible tube that extends from the reservoir. The catheter is placed into a large vein. Medicine that is  inserted into the reservoir goes into the catheter and then into the vein.  How will I care for my incision site? Do not get the incision site wet. Bathe or shower as directed by your health care provider. How is my port accessed? Special steps must be taken to access the port:  Before the port is accessed, a numbing cream can be placed on the skin. This helps numb the skin over the port site.  Your health care provider uses a sterile technique to access the port. ? Your health care provider must put on a mask and sterile gloves. ? The skin over your port is cleaned carefully with an antiseptic and allowed to dry. ? The port is gently pinched between sterile gloves, and a needle is inserted into the port.  Only "non-coring" port needles should be used to access the port. Once the port is accessed, a blood return should be checked. This helps ensure that the port is in the vein and is not clogged.  If your port needs to remain accessed for a constant infusion, a clear (transparent) bandage will be placed over the needle site. The bandage and needle will need to be changed every week, or as directed by your health care provider.  Keep the bandage covering the needle clean and dry. Do not get it wet. Follow your health care provider's instructions on how to take a shower or bath while the port is accessed.  If your port does not need to stay accessed, no bandage is needed over the port.  What is flushing? Flushing helps keep the port from getting clogged. Follow your health care provider's instructions on how and when to flush  the port. Ports are usually flushed with saline solution or a medicine called heparin. The need for flushing will depend on how the port is used.  If the port is used for intermittent medicines or blood draws, the port will need to be flushed: ? After medicines have been given. ? After blood has been drawn. ? As part of routine maintenance.  If a constant infusion is  running, the port may not need to be flushed.  How long will my port stay implanted? The port can stay in for as long as your health care provider thinks it is needed. When it is time for the port to come out, surgery will be done to remove it. The procedure is similar to the one performed when the port was put in. When should I seek immediate medical care? When you have an implanted port, you should seek immediate medical care if:  You notice a bad smell coming from the incision site.  You have swelling, redness, or drainage at the incision site.  You have more swelling or pain at the port site or the surrounding area.  You have a fever that is not controlled with medicine.  This information is not intended to replace advice given to you by your health care provider. Make sure you discuss any questions you have with your health care provider. Document Released: 08/19/2005 Document Revised: 01/25/2016 Document Reviewed: 04/26/2013 Elsevier Interactive Patient Education  2017 [place] Anesthesia is a term that refers to techniques, procedures, and medicines that help a person stay safe and comfortable during a medical procedure. Monitored anesthesia care, or sedation, is one type of anesthesia. Your anesthesia specialist may recommend sedation if you will be having a procedure that does not require you to be unconscious, such as:  Cataract surgery.  A dental procedure.  A biopsy.  A colonoscopy.  During the procedure, you may receive a medicine to help you relax (sedative). There are three levels of sedation:  Mild sedation. At this level, you may feel awake and relaxed. You will be able to follow directions.  Moderate sedation. At this level, you will be sleepy. You may not remember the procedure.  Deep sedation. At this level, you will be asleep. You will not remember the procedure.  The more medicine you are given, the deeper your level of  sedation will be. Depending on how you respond to the procedure, the anesthesia specialist may change your level of sedation or the type of anesthesia to fit your needs. An anesthesia specialist will monitor you closely during the procedure. Let your health care provider know about:  Any allergies you have.  All medicines you are taking, including vitamins, herbs, eye drops, creams, and over-the-counter medicines.  Any use of steroids (by mouth or as a cream).  Any problems you or family members have had with sedatives and anesthetic medicines.  Any blood disorders you have.  Any surgeries you have had.  Any medical conditions you have, such as sleep apnea.  Whether you are pregnant or may be pregnant.  Any use of cigarettes, alcohol, or street drugs. What are the risks? Generally, this is a safe procedure. However, problems may occur, including:  Getting too much medicine (oversedation).  Nausea.  Allergic reaction to medicines.  Trouble breathing. If this happens, a breathing tube may be used to help with breathing. It will be removed when you are awake and breathing on your own.  Heart trouble.  Lung  trouble.  Before the procedure Staying hydrated Follow instructions from your health care provider about hydration, which may include:  Up to 2 hours before the procedure - you may continue to drink clear liquids, such as water, clear fruit juice, black coffee, and plain tea.  Eating and drinking restrictions Follow instructions from your health care provider about eating and drinking, which may include:  8 hours before the procedure - stop eating heavy meals or foods such as meat, fried foods, or fatty foods.  6 hours before the procedure - stop eating light meals or foods, such as toast or cereal.  6 hours before the procedure - stop drinking milk or drinks that contain milk.  2 hours before the procedure - stop drinking clear liquids.  Medicines Ask your health  care provider about:  Changing or stopping your regular medicines. This is especially important if you are taking diabetes medicines or blood thinners.  Taking medicines such as aspirin and ibuprofen. These medicines can thin your blood. Do not take these medicines before your procedure if your health care provider instructs you not to.  Tests and exams  You will have a physical exam.  You may have blood tests done to show: ? How well your kidneys and liver are working. ? How well your blood can clot.  General instructions  Plan to have someone take you home from the hospital or clinic.  If you will be going home right after the procedure, plan to have someone with you for 24 hours.  What happens during the procedure?  Your blood pressure, heart rate, breathing, level of pain and overall condition will be monitored.  An IV tube will be inserted into one of your veins.  Your anesthesia specialist will give you medicines as needed to keep you comfortable during the procedure. This may mean changing the level of sedation.  The procedure will be performed. After the procedure  Your blood pressure, heart rate, breathing rate, and blood oxygen level will be monitored until the medicines you were given have worn off.  Do not drive for 24 hours if you received a sedative.  You may: ? Feel sleepy, clumsy, or nauseous. ? Feel forgetful about what happened after the procedure. ? Have a sore throat if you had a breathing tube during the procedure. ? Vomit. This information is not intended to replace advice given to you by your health care provider. Make sure you discuss any questions you have with your health care provider. Document Released: 05/15/2005 Document Revised: 01/26/2016 Document Reviewed: 12/10/2015 Elsevier Interactive Patient Education  2018 [place], Care After These instructions provide you with information about caring for yourself  after your procedure. Your health care provider may also give you more specific instructions. Your treatment has been planned according to current medical practices, but problems sometimes occur. Call your health care provider if you have any problems or questions after your procedure. What can I expect after the procedure? After your procedure, it is common to:  Feel sleepy for several hours.  Feel clumsy and have poor balance for several hours.  Feel forgetful about what happened after the procedure.  Have poor judgment for several hours.  Feel nauseous or vomit.  Have a sore throat if you had a breathing tube during the procedure.  Follow these instructions at home: For at least 24 hours after the procedure:   Do not: ? Participate in activities in which you could fall or become injured. ?  Drive. ? Use heavy machinery. ? Drink alcohol. ? Take sleeping pills or medicines that cause drowsiness. ? Make important decisions or sign legal documents. ? Take care of children on your own.  Rest. Eating and drinking  Follow the diet that is recommended by your health care provider.  If you vomit, drink water, juice, or soup when you can drink without vomiting.  Make sure you have little or no nausea before eating solid foods. General instructions  Have a responsible adult stay with you until you are awake and alert.  Take over-the-counter and prescription medicines only as told by your health care provider.  If you smoke, do not smoke without supervision.  Keep all follow-up visits as told by your health care provider. This is important. Contact a health care provider if:  You keep feeling nauseous or you keep vomiting.  You feel light-headed.  You develop a rash.  You have a fever. Get help right away if:  You have trouble breathing. This information is not intended to replace advice given to you by your health care provider. Make sure you discuss any questions you  have with your health care provider. Document Released: 12/10/2015 Document Revised: 04/10/2016 Document Reviewed: 12/10/2015 Elsevier Interactive Patient Education  [employer].

## 2018-02-18 ENCOUNTER — Encounter (HOSPITAL_COMMUNITY): Payer: Self-pay | Admitting: General Practice

## 2018-02-18 ENCOUNTER — Encounter (HOSPITAL_COMMUNITY)
Admission: RE | Admit: 2018-02-18 | Discharge: 2018-02-18 | Disposition: A | Discharge status: Home / Self Care | Payer: Medicare Other | Source: Ambulatory Visit | Attending: General Surgery | Admitting: General Surgery

## 2018-02-18 NOTE — Pre-Procedure Instructions (Signed)
Patient in for PAT accompanied by [facility] staff. Patient knows his name and date of birth but nothing else. He is speaking in whispers. I asked him what kind of surgery he was having done and he states, "my neck hurts, he is going to do something to this", and points to the right side of his neck where there is a mass. [facility] staff call CW there to see who patient's legal guardian is and he has none. Patient signed himself in to [facility]. He has a Product/process development scientist, Development worker, international aid. I called Jphnniw who states, "Since Mr Emig was admitted to Rehab, I am not handling his case, the SW at [facility] should be." called back to [facility] and the CW handling his case was unavailable. According to last note in [facility], patient's portacath had been canceled and [facility] was supposed to call Rosalio Macadamia back when they are ready to proceed. I called Dr Arnoldo Morale about the above and he asked me to call his office to see if they had any more recent updates on his care. I called Claiborne Billings at Dr Arnoldo Morale office and let her know that patient is unaware of what is going on and she states that the [facility] oncologist and the oncologist here had been talking and rescheduled this procedure for patient but there were no notes in [facility] to state that and patient seems unaware of anything about this. Patient also is very quiet and is not aware of month or year currently. Called cancer center and spoke with Amy and explained all of the above to her. She had Lupita Raider, RN in cancer center call [facility] to see what was going on with patient's care, Hildred Alamin was on the phone with them for 25 minutes and we ended up canceling patient for now until they decide what to do. Blair Hailey, OR scheduler and Dr Adline Mango office notified.

## 2018-02-18 NOTE — Progress Notes (Signed)
[facility] CSW Progress Notes  MEssage received from nursing, concerned that patient has declined port placement and may not understand implications of this decision.  CSW spoke w Charolette Forward, behavioral health clinician w [facility] states that patient has had serial mini mental status exams over past several months - latest measurement was 15/30.  She will visit patient at [facility] tomorrow, make an assessment and call CSW w recommendations for further help w patient.  Edwyna Shell, LCSW Clinical Social Worker Phone:  [phone]

## 2018-02-19 ENCOUNTER — Encounter: Payer: Self-pay | Admitting: General Practice

## 2018-02-19 NOTE — Progress Notes (Signed)
[facility] CSW Progress Notes  Spoke w Sharon Neville, behavioral health clinician w [facility].  Clinician visited patient today at [facility] where patient currently resides.  Administered MMSE - patient scored 16 out of 30.  Clinician has noted cognitive decline over past year; however is not recommending guardianship at this time.  Clinician, facility nurse and social worker all met w patient.  Patient states he wants chemotherapy for cancer treatment, is aware that "this will make him feel sick."  Reportedly did not understand surgical procedure for port placement thus did not consent to this last week.  Is now willing to have port placed and to initiate chemotherapy.  Per clinician, patient is bedbound at SNF, unable to ambulate at this time.  Was able to make his wishes for treatment known to providers today.  Clinician will be discussing case w [facility] staff and will keep in touch w CSW re additional support needed for patient.    , LCSW Clinical Social Worker Phone:  [phone]  

## 2018-02-20 ENCOUNTER — Ambulatory Visit (HOSPITAL_COMMUNITY): Admission: RE | Admit: 2018-02-20 | Payer: Medicare Other | Source: Ambulatory Visit | Admitting: General Surgery

## 2018-02-20 ENCOUNTER — Encounter (HOSPITAL_COMMUNITY): Admission: RE | Payer: Self-pay | Source: Ambulatory Visit

## 2018-02-20 SURGERY — INSERTION, TUNNELED CENTRAL VENOUS DEVICE, WITH PORT
Anesthesia: Monitor Anesthesia Care

## 2018-02-23 ENCOUNTER — Other Ambulatory Visit (HOSPITAL_COMMUNITY): Payer: Self-pay

## 2018-02-23 ENCOUNTER — Ambulatory Visit (HOSPITAL_COMMUNITY): Payer: Self-pay

## 2018-02-24 ENCOUNTER — Other Ambulatory Visit (HOSPITAL_COMMUNITY): Payer: Self-pay

## 2018-02-25 ENCOUNTER — Ambulatory Visit (HOSPITAL_COMMUNITY): Payer: Self-pay

## 2018-02-27 ENCOUNTER — Inpatient Hospital Stay (HOSPITAL_COMMUNITY): Payer: Medicare Other | Admitting: Hematology

## 2018-02-27 ENCOUNTER — Ambulatory Visit (HOSPITAL_COMMUNITY): Payer: Self-pay

## 2018-02-27 ENCOUNTER — Encounter (HOSPITAL_COMMUNITY): Payer: Self-pay | Admitting: Lab

## 2018-02-27 NOTE — Progress Notes (Unsigned)
I spoke with Colletta Maryland at [facility] and she stated that Dr Sherrie Sport he said to cancel all his appt.  They are considering hospice for him and they will [place] the cancer [place] if needed futher.

## 2018-03-02 ENCOUNTER — Ambulatory Visit (HOSPITAL_COMMUNITY): Payer: Medicare Other | Admitting: Hematology

## 2018-03-02 ENCOUNTER — Ambulatory Visit (HOSPITAL_COMMUNITY): Payer: Self-pay

## 2018-03-02 ENCOUNTER — Ambulatory Visit (HOSPITAL_COMMUNITY): Payer: Self-pay | Admitting: Hematology

## 2018-03-02 ENCOUNTER — Other Ambulatory Visit (HOSPITAL_COMMUNITY): Payer: Self-pay

## 2018-03-03 ENCOUNTER — Encounter: Payer: Self-pay | Admitting: General Practice

## 2018-03-03 ENCOUNTER — Encounter (HOSPITAL_COMMUNITY): Payer: Self-pay | Admitting: General Practice

## 2018-03-03 NOTE — Progress Notes (Signed)
[facility] CSW Progress Note  Call to Charolette Forward, [facility] and Sherlean Foot, former payee and support person.  Program is no longer involved w patient as he is now a long term resident of [facility].  Per these workers, Hospice is "trying to work w him" and patient is not pursuing treatment for cancer.  They will contact CSW if/when future needs arise for this patient.  CSW signing off at this time.  Edwyna Shell, LCSW Clinical Social Worker Phone:  [phone]

## 2018-03-04 ENCOUNTER — Ambulatory Visit (HOSPITAL_COMMUNITY): Payer: Self-pay

## 2018-04-02 DEATH — deceased
